# Patient Record
Sex: Male | Born: 1965 | Race: Black or African American | Hispanic: No | Smoking: Never smoker
Health system: Southern US, Community
[De-identification: ages and names within clinical notes are randomized; demographics above are authoritative.]

## PROBLEM LIST (undated history)

## (undated) DIAGNOSIS — Z91199 Patient's noncompliance with other medical treatment and regimen due to unspecified reason: Secondary | ICD-10-CM

## (undated) DIAGNOSIS — I5022 Chronic systolic (congestive) heart failure: Secondary | ICD-10-CM

## (undated) DIAGNOSIS — N179 Acute kidney failure, unspecified: Secondary | ICD-10-CM

## (undated) DIAGNOSIS — I10 Essential (primary) hypertension: Secondary | ICD-10-CM

## (undated) DIAGNOSIS — I428 Other cardiomyopathies: Secondary | ICD-10-CM

## (undated) DIAGNOSIS — M109 Gout, unspecified: Secondary | ICD-10-CM

## (undated) DIAGNOSIS — G4700 Insomnia, unspecified: Secondary | ICD-10-CM

## (undated) DIAGNOSIS — Z95810 Presence of automatic (implantable) cardiac defibrillator: Secondary | ICD-10-CM

## (undated) DIAGNOSIS — I2510 Atherosclerotic heart disease of native coronary artery without angina pectoris: Secondary | ICD-10-CM

## (undated) DIAGNOSIS — I509 Heart failure, unspecified: Secondary | ICD-10-CM

## (undated) DIAGNOSIS — F419 Anxiety disorder, unspecified: Secondary | ICD-10-CM

## (undated) DIAGNOSIS — F32A Depression, unspecified: Secondary | ICD-10-CM

## (undated) DIAGNOSIS — R519 Headache, unspecified: Secondary | ICD-10-CM

## (undated) DIAGNOSIS — Z9119 Patient's noncompliance with other medical treatment and regimen: Secondary | ICD-10-CM

## (undated) DIAGNOSIS — F1010 Alcohol abuse, uncomplicated: Secondary | ICD-10-CM

## (undated) DIAGNOSIS — Z598 Other problems related to housing and economic circumstances: Secondary | ICD-10-CM

## (undated) DIAGNOSIS — G4730 Sleep apnea, unspecified: Secondary | ICD-10-CM

## (undated) DIAGNOSIS — R0600 Dyspnea, unspecified: Secondary | ICD-10-CM

## (undated) DIAGNOSIS — I472 Ventricular tachycardia: Secondary | ICD-10-CM

## (undated) DIAGNOSIS — Z599 Problem related to housing and economic circumstances, unspecified: Secondary | ICD-10-CM

## (undated) DIAGNOSIS — F329 Major depressive disorder, single episode, unspecified: Secondary | ICD-10-CM

## (undated) DIAGNOSIS — G8929 Other chronic pain: Secondary | ICD-10-CM

## (undated) HISTORY — PX: BAROREFLEX SYSTEM INSERTION: EP1254

## (undated) HISTORY — DX: Gout, unspecified: M10.9

## (undated) HISTORY — DX: Atherosclerotic heart disease of native coronary artery without angina pectoris: I25.10

## (undated) HISTORY — DX: Ventricular tachycardia: I47.2

## (undated) HISTORY — DX: Other cardiomyopathies: I42.8

## (undated) HISTORY — DX: Acute kidney failure, unspecified: N17.9

## (undated) HISTORY — DX: Heart failure, unspecified: I50.9

## (undated) HISTORY — PX: HERNIA REPAIR: SHX51

---
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
## 2000-02-03 ENCOUNTER — Emergency Department (HOSPITAL_COMMUNITY): Admission: EM | Admit: 2000-02-03 | Discharge: 2000-02-03 | Payer: Self-pay | Admitting: Emergency Medicine

## 2000-02-04 ENCOUNTER — Emergency Department (HOSPITAL_COMMUNITY): Admission: EM | Admit: 2000-02-04 | Discharge: 2000-02-04 | Payer: Self-pay | Admitting: Emergency Medicine

## 2000-05-24 ENCOUNTER — Encounter: Payer: Self-pay | Admitting: Emergency Medicine

## 2000-05-24 ENCOUNTER — Emergency Department (HOSPITAL_COMMUNITY): Admission: EM | Admit: 2000-05-24 | Discharge: 2000-05-24 | Payer: Self-pay

## 2000-06-09 ENCOUNTER — Encounter: Payer: Self-pay | Admitting: Emergency Medicine

## 2000-06-09 ENCOUNTER — Emergency Department (HOSPITAL_COMMUNITY): Admission: EM | Admit: 2000-06-09 | Discharge: 2000-06-09 | Payer: Self-pay | Admitting: Emergency Medicine

## 2000-08-15 ENCOUNTER — Encounter: Payer: Self-pay | Admitting: Emergency Medicine

## 2000-08-15 ENCOUNTER — Emergency Department (HOSPITAL_COMMUNITY): Admission: EM | Admit: 2000-08-15 | Discharge: 2000-08-15 | Payer: Self-pay | Admitting: Emergency Medicine

## 2000-08-30 ENCOUNTER — Encounter: Payer: Self-pay | Admitting: Emergency Medicine

## 2000-08-30 ENCOUNTER — Emergency Department (HOSPITAL_COMMUNITY): Admission: EM | Admit: 2000-08-30 | Discharge: 2000-08-30 | Payer: Self-pay | Admitting: Emergency Medicine

## 2003-01-06 DIAGNOSIS — I219 Acute myocardial infarction, unspecified: Secondary | ICD-10-CM

## 2003-01-06 HISTORY — DX: Acute myocardial infarction, unspecified: I21.9

## 2009-01-05 HISTORY — PX: PERCUTANEOUS CORONARY STENT INTERVENTION (PCI-S): SHX6016

## 2013-06-05 HISTORY — PX: CARDIAC DEFIBRILLATOR PLACEMENT: SHX171

## 2014-01-10 DIAGNOSIS — M1A9XX0 Chronic gout, unspecified, without tophus (tophi): Secondary | ICD-10-CM | POA: Diagnosis not present

## 2014-01-10 DIAGNOSIS — I10 Essential (primary) hypertension: Secondary | ICD-10-CM | POA: Diagnosis not present

## 2014-01-10 DIAGNOSIS — E780 Pure hypercholesterolemia: Secondary | ICD-10-CM | POA: Diagnosis not present

## 2014-01-10 DIAGNOSIS — I429 Cardiomyopathy, unspecified: Secondary | ICD-10-CM | POA: Diagnosis not present

## 2014-01-10 DIAGNOSIS — F329 Major depressive disorder, single episode, unspecified: Secondary | ICD-10-CM | POA: Diagnosis not present

## 2014-01-10 DIAGNOSIS — Z658 Other specified problems related to psychosocial circumstances: Secondary | ICD-10-CM | POA: Diagnosis not present

## 2014-01-17 DIAGNOSIS — N189 Chronic kidney disease, unspecified: Secondary | ICD-10-CM | POA: Diagnosis not present

## 2014-01-17 DIAGNOSIS — E876 Hypokalemia: Secondary | ICD-10-CM | POA: Diagnosis not present

## 2014-01-17 DIAGNOSIS — I5020 Unspecified systolic (congestive) heart failure: Secondary | ICD-10-CM | POA: Diagnosis not present

## 2014-01-18 DIAGNOSIS — I429 Cardiomyopathy, unspecified: Secondary | ICD-10-CM | POA: Diagnosis not present

## 2014-01-18 DIAGNOSIS — M109 Gout, unspecified: Secondary | ICD-10-CM | POA: Diagnosis not present

## 2014-01-18 DIAGNOSIS — E780 Pure hypercholesterolemia: Secondary | ICD-10-CM | POA: Diagnosis not present

## 2014-01-18 DIAGNOSIS — R079 Chest pain, unspecified: Secondary | ICD-10-CM | POA: Diagnosis not present

## 2014-01-18 DIAGNOSIS — I10 Essential (primary) hypertension: Secondary | ICD-10-CM | POA: Diagnosis not present

## 2014-01-18 DIAGNOSIS — I509 Heart failure, unspecified: Secondary | ICD-10-CM | POA: Diagnosis not present

## 2014-01-18 DIAGNOSIS — Z79899 Other long term (current) drug therapy: Secondary | ICD-10-CM | POA: Diagnosis not present

## 2014-01-18 DIAGNOSIS — R0602 Shortness of breath: Secondary | ICD-10-CM | POA: Diagnosis not present

## 2014-01-18 DIAGNOSIS — F329 Major depressive disorder, single episode, unspecified: Secondary | ICD-10-CM | POA: Diagnosis not present

## 2014-01-18 DIAGNOSIS — I252 Old myocardial infarction: Secondary | ICD-10-CM | POA: Diagnosis not present

## 2014-01-18 DIAGNOSIS — Z888 Allergy status to other drugs, medicaments and biological substances status: Secondary | ICD-10-CM | POA: Diagnosis not present

## 2014-01-18 DIAGNOSIS — R600 Localized edema: Secondary | ICD-10-CM | POA: Diagnosis not present

## 2014-01-19 DIAGNOSIS — R079 Chest pain, unspecified: Secondary | ICD-10-CM | POA: Diagnosis not present

## 2014-01-19 DIAGNOSIS — R0602 Shortness of breath: Secondary | ICD-10-CM | POA: Diagnosis not present

## 2014-01-19 DIAGNOSIS — M109 Gout, unspecified: Secondary | ICD-10-CM | POA: Diagnosis not present

## 2014-01-19 DIAGNOSIS — I10 Essential (primary) hypertension: Secondary | ICD-10-CM | POA: Diagnosis not present

## 2014-01-19 DIAGNOSIS — I429 Cardiomyopathy, unspecified: Secondary | ICD-10-CM | POA: Diagnosis not present

## 2014-01-19 DIAGNOSIS — R600 Localized edema: Secondary | ICD-10-CM | POA: Diagnosis not present

## 2014-01-19 DIAGNOSIS — I509 Heart failure, unspecified: Secondary | ICD-10-CM | POA: Diagnosis not present

## 2014-01-19 DIAGNOSIS — E780 Pure hypercholesterolemia: Secondary | ICD-10-CM | POA: Diagnosis not present

## 2014-01-19 DIAGNOSIS — I252 Old myocardial infarction: Secondary | ICD-10-CM | POA: Diagnosis not present

## 2014-02-12 DIAGNOSIS — M5136 Other intervertebral disc degeneration, lumbar region: Secondary | ICD-10-CM | POA: Diagnosis not present

## 2014-02-15 DIAGNOSIS — E876 Hypokalemia: Secondary | ICD-10-CM | POA: Diagnosis not present

## 2014-02-19 DIAGNOSIS — E876 Hypokalemia: Secondary | ICD-10-CM | POA: Diagnosis not present

## 2014-02-19 DIAGNOSIS — I5020 Unspecified systolic (congestive) heart failure: Secondary | ICD-10-CM | POA: Diagnosis not present

## 2014-02-26 DIAGNOSIS — I429 Cardiomyopathy, unspecified: Secondary | ICD-10-CM | POA: Diagnosis not present

## 2014-03-22 DIAGNOSIS — I10 Essential (primary) hypertension: Secondary | ICD-10-CM | POA: Diagnosis not present

## 2014-03-26 DIAGNOSIS — Z45018 Encounter for adjustment and management of other part of cardiac pacemaker: Secondary | ICD-10-CM | POA: Diagnosis not present

## 2014-04-16 DIAGNOSIS — F321 Major depressive disorder, single episode, moderate: Secondary | ICD-10-CM | POA: Diagnosis not present

## 2014-04-23 DIAGNOSIS — M47816 Spondylosis without myelopathy or radiculopathy, lumbar region: Secondary | ICD-10-CM | POA: Diagnosis not present

## 2014-04-25 DIAGNOSIS — M5126 Other intervertebral disc displacement, lumbar region: Secondary | ICD-10-CM | POA: Diagnosis not present

## 2014-04-25 DIAGNOSIS — M5136 Other intervertebral disc degeneration, lumbar region: Secondary | ICD-10-CM | POA: Diagnosis not present

## 2014-04-25 DIAGNOSIS — M5137 Other intervertebral disc degeneration, lumbosacral region: Secondary | ICD-10-CM | POA: Diagnosis not present

## 2014-04-25 DIAGNOSIS — M549 Dorsalgia, unspecified: Secondary | ICD-10-CM | POA: Diagnosis not present

## 2014-04-25 DIAGNOSIS — M545 Low back pain: Secondary | ICD-10-CM | POA: Diagnosis not present

## 2014-04-27 DIAGNOSIS — M158 Other polyosteoarthritis: Secondary | ICD-10-CM | POA: Diagnosis not present

## 2014-04-27 DIAGNOSIS — M1A9XX0 Chronic gout, unspecified, without tophus (tophi): Secondary | ICD-10-CM | POA: Diagnosis not present

## 2014-05-22 DIAGNOSIS — M545 Low back pain: Secondary | ICD-10-CM | POA: Diagnosis not present

## 2014-05-28 DIAGNOSIS — K5730 Diverticulosis of large intestine without perforation or abscess without bleeding: Secondary | ICD-10-CM | POA: Diagnosis not present

## 2014-05-28 DIAGNOSIS — K648 Other hemorrhoids: Secondary | ICD-10-CM | POA: Diagnosis not present

## 2014-05-28 DIAGNOSIS — Z1211 Encounter for screening for malignant neoplasm of colon: Secondary | ICD-10-CM | POA: Diagnosis not present

## 2014-06-25 DIAGNOSIS — Z4502 Encounter for adjustment and management of automatic implantable cardiac defibrillator: Secondary | ICD-10-CM | POA: Diagnosis not present

## 2014-06-25 DIAGNOSIS — I429 Cardiomyopathy, unspecified: Secondary | ICD-10-CM | POA: Diagnosis not present

## 2014-08-29 DIAGNOSIS — M1A9XX0 Chronic gout, unspecified, without tophus (tophi): Secondary | ICD-10-CM | POA: Diagnosis not present

## 2014-08-29 DIAGNOSIS — M158 Other polyosteoarthritis: Secondary | ICD-10-CM | POA: Diagnosis not present

## 2014-09-17 DIAGNOSIS — Z4502 Encounter for adjustment and management of automatic implantable cardiac defibrillator: Secondary | ICD-10-CM | POA: Diagnosis not present

## 2014-09-17 DIAGNOSIS — I420 Dilated cardiomyopathy: Secondary | ICD-10-CM | POA: Diagnosis not present

## 2014-09-24 DIAGNOSIS — Z4502 Encounter for adjustment and management of automatic implantable cardiac defibrillator: Secondary | ICD-10-CM | POA: Diagnosis not present

## 2014-09-28 DIAGNOSIS — I420 Dilated cardiomyopathy: Secondary | ICD-10-CM | POA: Diagnosis not present

## 2014-09-28 DIAGNOSIS — Z95810 Presence of automatic (implantable) cardiac defibrillator: Secondary | ICD-10-CM | POA: Diagnosis not present

## 2014-12-06 DIAGNOSIS — M1A9XX0 Chronic gout, unspecified, without tophus (tophi): Secondary | ICD-10-CM | POA: Diagnosis not present

## 2015-01-06 DIAGNOSIS — I472 Ventricular tachycardia, unspecified: Secondary | ICD-10-CM

## 2015-01-06 HISTORY — DX: Ventricular tachycardia, unspecified: I47.20

## 2015-01-06 HISTORY — DX: Ventricular tachycardia: I47.2

## 2015-02-01 DIAGNOSIS — I499 Cardiac arrhythmia, unspecified: Secondary | ICD-10-CM | POA: Diagnosis not present

## 2015-02-01 DIAGNOSIS — I252 Old myocardial infarction: Secondary | ICD-10-CM | POA: Diagnosis not present

## 2015-02-01 DIAGNOSIS — T82198A Other mechanical complication of other cardiac electronic device, initial encounter: Secondary | ICD-10-CM | POA: Diagnosis not present

## 2015-02-01 DIAGNOSIS — I10 Essential (primary) hypertension: Secondary | ICD-10-CM | POA: Diagnosis not present

## 2015-02-01 DIAGNOSIS — R9431 Abnormal electrocardiogram [ECG] [EKG]: Secondary | ICD-10-CM | POA: Diagnosis not present

## 2015-02-01 DIAGNOSIS — M1990 Unspecified osteoarthritis, unspecified site: Secondary | ICD-10-CM | POA: Diagnosis not present

## 2015-02-01 DIAGNOSIS — R079 Chest pain, unspecified: Secondary | ICD-10-CM | POA: Diagnosis not present

## 2015-02-01 DIAGNOSIS — E785 Hyperlipidemia, unspecified: Secondary | ICD-10-CM | POA: Diagnosis not present

## 2015-02-01 DIAGNOSIS — I420 Dilated cardiomyopathy: Secondary | ICD-10-CM | POA: Diagnosis not present

## 2015-02-01 DIAGNOSIS — I472 Ventricular tachycardia: Secondary | ICD-10-CM | POA: Diagnosis not present

## 2015-02-01 DIAGNOSIS — I509 Heart failure, unspecified: Secondary | ICD-10-CM | POA: Diagnosis not present

## 2015-02-01 DIAGNOSIS — I2510 Atherosclerotic heart disease of native coronary artery without angina pectoris: Secondary | ICD-10-CM | POA: Diagnosis not present

## 2015-02-01 DIAGNOSIS — R0602 Shortness of breath: Secondary | ICD-10-CM | POA: Diagnosis not present

## 2015-02-02 DIAGNOSIS — I428 Other cardiomyopathies: Secondary | ICD-10-CM | POA: Diagnosis not present

## 2015-02-02 DIAGNOSIS — T82198A Other mechanical complication of other cardiac electronic device, initial encounter: Secondary | ICD-10-CM | POA: Diagnosis not present

## 2015-02-02 DIAGNOSIS — I5020 Unspecified systolic (congestive) heart failure: Secondary | ICD-10-CM | POA: Diagnosis not present

## 2015-02-02 DIAGNOSIS — D72829 Elevated white blood cell count, unspecified: Secondary | ICD-10-CM | POA: Diagnosis not present

## 2015-02-02 DIAGNOSIS — R740 Nonspecific elevation of levels of transaminase and lactic acid dehydrogenase [LDH]: Secondary | ICD-10-CM | POA: Diagnosis not present

## 2015-02-02 DIAGNOSIS — I10 Essential (primary) hypertension: Secondary | ICD-10-CM | POA: Diagnosis not present

## 2015-02-03 DIAGNOSIS — I10 Essential (primary) hypertension: Secondary | ICD-10-CM | POA: Diagnosis not present

## 2015-02-03 DIAGNOSIS — D72829 Elevated white blood cell count, unspecified: Secondary | ICD-10-CM | POA: Diagnosis not present

## 2015-02-03 DIAGNOSIS — I428 Other cardiomyopathies: Secondary | ICD-10-CM | POA: Diagnosis not present

## 2015-02-03 DIAGNOSIS — T82198A Other mechanical complication of other cardiac electronic device, initial encounter: Secondary | ICD-10-CM | POA: Diagnosis not present

## 2015-02-03 DIAGNOSIS — R740 Nonspecific elevation of levels of transaminase and lactic acid dehydrogenase [LDH]: Secondary | ICD-10-CM | POA: Diagnosis not present

## 2015-02-04 DIAGNOSIS — I428 Other cardiomyopathies: Secondary | ICD-10-CM | POA: Diagnosis not present

## 2015-02-04 DIAGNOSIS — I10 Essential (primary) hypertension: Secondary | ICD-10-CM | POA: Diagnosis not present

## 2015-02-04 DIAGNOSIS — D72829 Elevated white blood cell count, unspecified: Secondary | ICD-10-CM | POA: Diagnosis not present

## 2015-02-04 DIAGNOSIS — T82198A Other mechanical complication of other cardiac electronic device, initial encounter: Secondary | ICD-10-CM | POA: Diagnosis not present

## 2015-02-04 DIAGNOSIS — R740 Nonspecific elevation of levels of transaminase and lactic acid dehydrogenase [LDH]: Secondary | ICD-10-CM | POA: Diagnosis not present

## 2015-02-11 DIAGNOSIS — I5022 Chronic systolic (congestive) heart failure: Secondary | ICD-10-CM | POA: Diagnosis not present

## 2015-02-11 DIAGNOSIS — I420 Dilated cardiomyopathy: Secondary | ICD-10-CM | POA: Diagnosis not present

## 2015-02-11 DIAGNOSIS — I10 Essential (primary) hypertension: Secondary | ICD-10-CM | POA: Diagnosis not present

## 2015-02-11 DIAGNOSIS — I472 Ventricular tachycardia: Secondary | ICD-10-CM | POA: Diagnosis not present

## 2015-03-26 DIAGNOSIS — K219 Gastro-esophageal reflux disease without esophagitis: Secondary | ICD-10-CM | POA: Diagnosis not present

## 2015-03-26 DIAGNOSIS — I252 Old myocardial infarction: Secondary | ICD-10-CM | POA: Diagnosis not present

## 2015-03-26 DIAGNOSIS — I5022 Chronic systolic (congestive) heart failure: Secondary | ICD-10-CM | POA: Diagnosis not present

## 2015-03-26 DIAGNOSIS — M109 Gout, unspecified: Secondary | ICD-10-CM | POA: Diagnosis not present

## 2015-03-26 DIAGNOSIS — I110 Hypertensive heart disease with heart failure: Secondary | ICD-10-CM | POA: Diagnosis not present

## 2015-03-26 DIAGNOSIS — E7800 Pure hypercholesterolemia, unspecified: Secondary | ICD-10-CM | POA: Diagnosis not present

## 2015-03-26 DIAGNOSIS — R079 Chest pain, unspecified: Secondary | ICD-10-CM | POA: Diagnosis not present

## 2015-03-26 DIAGNOSIS — Z4502 Encounter for adjustment and management of automatic implantable cardiac defibrillator: Secondary | ICD-10-CM | POA: Diagnosis not present

## 2015-03-26 DIAGNOSIS — Z95810 Presence of automatic (implantable) cardiac defibrillator: Secondary | ICD-10-CM | POA: Diagnosis not present

## 2015-03-27 DIAGNOSIS — R079 Chest pain, unspecified: Secondary | ICD-10-CM | POA: Diagnosis not present

## 2015-03-27 DIAGNOSIS — I517 Cardiomegaly: Secondary | ICD-10-CM | POA: Diagnosis not present

## 2015-03-27 DIAGNOSIS — I509 Heart failure, unspecified: Secondary | ICD-10-CM | POA: Diagnosis not present

## 2015-03-27 DIAGNOSIS — I10 Essential (primary) hypertension: Secondary | ICD-10-CM | POA: Diagnosis not present

## 2015-03-27 DIAGNOSIS — I472 Ventricular tachycardia: Secondary | ICD-10-CM | POA: Diagnosis not present

## 2015-03-29 DIAGNOSIS — F329 Major depressive disorder, single episode, unspecified: Secondary | ICD-10-CM | POA: Diagnosis not present

## 2015-03-29 DIAGNOSIS — M1A9XX0 Chronic gout, unspecified, without tophus (tophi): Secondary | ICD-10-CM | POA: Diagnosis not present

## 2015-03-29 DIAGNOSIS — M158 Other polyosteoarthritis: Secondary | ICD-10-CM | POA: Diagnosis not present

## 2015-04-22 DIAGNOSIS — Z91010 Allergy to peanuts: Secondary | ICD-10-CM | POA: Diagnosis not present

## 2015-04-22 DIAGNOSIS — Z79899 Other long term (current) drug therapy: Secondary | ICD-10-CM | POA: Diagnosis not present

## 2015-04-22 DIAGNOSIS — M109 Gout, unspecified: Secondary | ICD-10-CM | POA: Diagnosis not present

## 2015-04-22 DIAGNOSIS — F329 Major depressive disorder, single episode, unspecified: Secondary | ICD-10-CM | POA: Diagnosis not present

## 2015-04-22 DIAGNOSIS — M1990 Unspecified osteoarthritis, unspecified site: Secondary | ICD-10-CM | POA: Diagnosis not present

## 2015-04-22 DIAGNOSIS — Z888 Allergy status to other drugs, medicaments and biological substances status: Secondary | ICD-10-CM | POA: Diagnosis not present

## 2015-04-22 DIAGNOSIS — I5022 Chronic systolic (congestive) heart failure: Secondary | ICD-10-CM | POA: Diagnosis not present

## 2015-04-22 DIAGNOSIS — Z95810 Presence of automatic (implantable) cardiac defibrillator: Secondary | ICD-10-CM | POA: Diagnosis not present

## 2015-04-22 DIAGNOSIS — E7800 Pure hypercholesterolemia, unspecified: Secondary | ICD-10-CM | POA: Diagnosis not present

## 2015-04-22 DIAGNOSIS — I471 Supraventricular tachycardia: Secondary | ICD-10-CM | POA: Diagnosis not present

## 2015-04-22 DIAGNOSIS — Z91013 Allergy to seafood: Secondary | ICD-10-CM | POA: Diagnosis not present

## 2015-04-22 DIAGNOSIS — I110 Hypertensive heart disease with heart failure: Secondary | ICD-10-CM | POA: Diagnosis not present

## 2015-04-22 DIAGNOSIS — R079 Chest pain, unspecified: Secondary | ICD-10-CM | POA: Diagnosis not present

## 2015-04-22 DIAGNOSIS — Z7982 Long term (current) use of aspirin: Secondary | ICD-10-CM | POA: Diagnosis not present

## 2015-04-22 DIAGNOSIS — I509 Heart failure, unspecified: Secondary | ICD-10-CM | POA: Diagnosis not present

## 2015-05-02 DIAGNOSIS — K8020 Calculus of gallbladder without cholecystitis without obstruction: Secondary | ICD-10-CM | POA: Diagnosis not present

## 2015-05-02 DIAGNOSIS — R932 Abnormal findings on diagnostic imaging of liver and biliary tract: Secondary | ICD-10-CM | POA: Diagnosis not present

## 2015-05-02 DIAGNOSIS — I509 Heart failure, unspecified: Secondary | ICD-10-CM | POA: Diagnosis not present

## 2015-05-13 DIAGNOSIS — Z91010 Allergy to peanuts: Secondary | ICD-10-CM | POA: Diagnosis not present

## 2015-05-13 DIAGNOSIS — Z7982 Long term (current) use of aspirin: Secondary | ICD-10-CM | POA: Diagnosis not present

## 2015-05-13 DIAGNOSIS — M1990 Unspecified osteoarthritis, unspecified site: Secondary | ICD-10-CM | POA: Diagnosis not present

## 2015-05-13 DIAGNOSIS — E7800 Pure hypercholesterolemia, unspecified: Secondary | ICD-10-CM | POA: Diagnosis not present

## 2015-05-13 DIAGNOSIS — I5022 Chronic systolic (congestive) heart failure: Secondary | ICD-10-CM | POA: Diagnosis not present

## 2015-05-13 DIAGNOSIS — M1000 Idiopathic gout, unspecified site: Secondary | ICD-10-CM | POA: Diagnosis not present

## 2015-05-13 DIAGNOSIS — I110 Hypertensive heart disease with heart failure: Secondary | ICD-10-CM | POA: Diagnosis not present

## 2015-05-13 DIAGNOSIS — Z91013 Allergy to seafood: Secondary | ICD-10-CM | POA: Diagnosis not present

## 2015-05-13 DIAGNOSIS — M2550 Pain in unspecified joint: Secondary | ICD-10-CM | POA: Diagnosis not present

## 2015-05-13 DIAGNOSIS — Z4502 Encounter for adjustment and management of automatic implantable cardiac defibrillator: Secondary | ICD-10-CM | POA: Diagnosis not present

## 2015-05-13 DIAGNOSIS — Z888 Allergy status to other drugs, medicaments and biological substances status: Secondary | ICD-10-CM | POA: Diagnosis not present

## 2015-05-13 DIAGNOSIS — I472 Ventricular tachycardia: Secondary | ICD-10-CM | POA: Diagnosis not present

## 2015-05-13 DIAGNOSIS — Z79899 Other long term (current) drug therapy: Secondary | ICD-10-CM | POA: Diagnosis not present

## 2015-06-20 ENCOUNTER — Emergency Department (HOSPITAL_COMMUNITY): Payer: Medicare Other

## 2015-06-20 ENCOUNTER — Emergency Department (HOSPITAL_COMMUNITY)
Admission: EM | Admit: 2015-06-20 | Discharge: 2015-06-20 | Disposition: A | Discharge status: Home / Self Care | Payer: Medicare Other | Attending: Dermatology | Admitting: Dermatology

## 2015-06-20 ENCOUNTER — Encounter (HOSPITAL_COMMUNITY): Payer: Self-pay | Admitting: Emergency Medicine

## 2015-06-20 DIAGNOSIS — R079 Chest pain, unspecified: Secondary | ICD-10-CM | POA: Diagnosis not present

## 2015-06-20 DIAGNOSIS — Z5321 Procedure and treatment not carried out due to patient leaving prior to being seen by health care provider: Secondary | ICD-10-CM | POA: Diagnosis not present

## 2015-06-20 DIAGNOSIS — I10 Essential (primary) hypertension: Secondary | ICD-10-CM | POA: Diagnosis not present

## 2015-06-20 HISTORY — DX: Gout, unspecified: M10.9

## 2015-06-20 HISTORY — DX: Insomnia, unspecified: G47.00

## 2015-06-20 HISTORY — DX: Essential (primary) hypertension: I10

## 2015-06-20 LAB — BASIC METABOLIC PANEL
Anion gap: 9 (ref 5–15)
BUN: 13 mg/dL (ref 6–20)
CALCIUM: 8.9 mg/dL (ref 8.9–10.3)
CHLORIDE: 108 mmol/L (ref 101–111)
CO2: 23 mmol/L (ref 22–32)
CREATININE: 0.96 mg/dL (ref 0.61–1.24)
Glucose, Bld: 101 mg/dL — ABNORMAL HIGH (ref 65–99)
Potassium: 3.2 mmol/L — ABNORMAL LOW (ref 3.5–5.1)
SODIUM: 140 mmol/L (ref 135–145)

## 2015-06-20 LAB — CBC
HCT: 39.3 % (ref 39.0–52.0)
Hemoglobin: 13 g/dL (ref 13.0–17.0)
MCH: 32.3 pg (ref 26.0–34.0)
MCHC: 33.1 g/dL (ref 30.0–36.0)
MCV: 97.5 fL (ref 78.0–100.0)
PLATELETS: 297 10*3/uL (ref 150–400)
RBC: 4.03 MIL/uL — AB (ref 4.22–5.81)
RDW: 13.8 % (ref 11.5–15.5)
WBC: 11.3 10*3/uL — AB (ref 4.0–10.5)

## 2015-06-20 LAB — I-STAT TROPONIN, ED: TROPONIN I, POC: 0.01 ng/mL (ref 0.00–0.08)

## 2015-06-20 NOTE — ED Notes (Addendum)
Called pt to go back to room to see provider X1 no answer.

## 2015-06-20 NOTE — ED Notes (Signed)
Called and no answer.

## 2015-06-20 NOTE — ED Notes (Signed)
Patient states started having chest pain this morning about 0400.   Patient states did have some shortness of breath with the central chest pain.   Patient states some radiation to L arm.   Denies dizziness, but states did have N/V with pain.  Denies diaphoresis.   Patient states chest pain has lessened since this morning.   Patient took some ibuprofen and it provided some relief.   Patient states he does have a defibrillator, and has had chest pains periodically since the defibrillator fired in Jan/Feb.

## 2015-07-03 ENCOUNTER — Ambulatory Visit (INDEPENDENT_AMBULATORY_CARE_PROVIDER_SITE_OTHER): Payer: Medicare Other | Admitting: Internal Medicine

## 2015-07-03 ENCOUNTER — Encounter: Payer: Self-pay | Admitting: Internal Medicine

## 2015-07-03 ENCOUNTER — Other Ambulatory Visit: Payer: Self-pay

## 2015-07-03 VITALS — BP 148/90 | HR 94 | Ht 65.0 in | Wt 152.2 lb

## 2015-07-03 DIAGNOSIS — I10 Essential (primary) hypertension: Secondary | ICD-10-CM | POA: Diagnosis not present

## 2015-07-03 DIAGNOSIS — I472 Ventricular tachycardia, unspecified: Secondary | ICD-10-CM | POA: Insufficient documentation

## 2015-07-03 DIAGNOSIS — I519 Heart disease, unspecified: Secondary | ICD-10-CM

## 2015-07-03 DIAGNOSIS — I428 Other cardiomyopathies: Secondary | ICD-10-CM | POA: Insufficient documentation

## 2015-07-03 DIAGNOSIS — I429 Cardiomyopathy, unspecified: Secondary | ICD-10-CM | POA: Diagnosis not present

## 2015-07-03 DIAGNOSIS — Z0000 Encounter for general adult medical examination without abnormal findings: Secondary | ICD-10-CM

## 2015-07-03 LAB — CUP PACEART INCLINIC DEVICE CHECK
Date Time Interrogation Session: 20170628040000
HighPow Impedance: 43 Ohm
HighPow Impedance: 50 Ohm
Implantable Lead Implant Date: 20150601
Implantable Lead Location: 753860
Implantable Lead Model: 295
Implantable Lead Serial Number: 134196
Lead Channel Pacing Threshold Amplitude: 1.8 V
Lead Channel Pacing Threshold Pulse Width: 0.4 ms
Lead Channel Setting Pacing Pulse Width: 0.4 ms
MDC IDC MSMT LEADCHNL RV IMPEDANCE VALUE: 436 Ohm
MDC IDC MSMT LEADCHNL RV SENSING INTR AMPL: 10.5 mV
MDC IDC PG SERIAL: 190689
MDC IDC SET LEADCHNL RV PACING AMPLITUDE: 2.5 V
MDC IDC SET LEADCHNL RV SENSING SENSITIVITY: 0.6 mV
MDC IDC STAT BRADY RV PERCENT PACED: 1 % — AB

## 2015-07-03 MED ORDER — DIGOXIN 125 MCG PO TABS: 0.1250 mg | ORAL_TABLET | Freq: Every day | ORAL | Status: DC

## 2015-07-03 MED ORDER — VALSARTAN 160 MG PO TABS: 160.0000 mg | ORAL_TABLET | Freq: Two times a day (BID) | ORAL | Status: DC

## 2015-07-03 NOTE — Patient Instructions (Addendum)
Medication Instructions:  Your physician recommends that you continue on your current medications as directed. Please refer to the Current Medication list given to you today.   Labwork: None ordered   Testing/Procedures: None ordered   Follow-Up:  You have been referred to Dr Eilleen Kempf for PCP needs   Your physician wants you to follow-up in:  6 months with Stanley [name], PAYou will receive a reminder letter in the mail two months in advance. If you don't receive a letter, please call our office to schedule the follow-up appointment.   Remote monitoring is used to monitor your  ICD from home. This monitoring reduces the number of office visits required to check your device to one time per year. It allows [place] to keep an eye on the functioning of your device to ensure it is working properly. You are scheduled for a device check from home on 10/02/15. You may send your transmission at any time that day. If you have a wireless device, the transmission will be sent automatically. After your physician reviews your transmission, you will receive a postcard with your next transmission date.    Any Other Special Instructions Will Be Listed Below (If Applicable).  Low-Sodium Eating Plan Sodium raises blood pressure and causes water to be held in the body. Getting less sodium from food will help lower your blood pressure, reduce any swelling, and protect your heart, liver, and kidneys. We get sodium by adding salt (sodium chloride) to food. Most of our sodium comes from canned, boxed, and frozen foods. Restaurant foods, fast foods, and pizza are also very high in sodium. Even if you take medicine to lower your blood pressure or to reduce fluid in your body, getting less sodium from your food is important. WHAT IS MY PLAN? Most people should limit their sodium intake to 2,300 mg a day. Your health care provider recommends that you limit your sodium intake to 2 grams a day.  WHAT DO I NEED TO KNOW ABOUT  THIS EATING PLAN? For the low-sodium eating plan, you will follow these general guidelines:  Choose foods with a % Daily Value for sodium of less than 5% (as listed on the food label).   Use salt-free seasonings or herbs instead of table salt or sea salt.   Check with your health care provider or pharmacist before using salt substitutes.   Eat fresh foods.  Eat more vegetables and fruits.  Limit canned vegetables. If you do use them, rinse them well to decrease the sodium.   Limit cheese to 1 oz (28 g) per day.   Eat lower-sodium products, often labeled as "lower sodium" or "no salt added."  Avoid foods that contain monosodium glutamate (MSG). MSG is sometimes added to [place] food and some canned foods.  Check food labels (Nutrition Facts labels) on foods to learn how much sodium is in one serving.  Eat more home-cooked food and less restaurant, buffet, and fast food.  When eating at a restaurant, ask that your food be prepared with less salt, or no salt if possible.  HOW DO I READ FOOD LABELS FOR SODIUM INFORMATION? The Nutrition Facts label lists the amount of sodium in one serving of the food. If you eat more than one serving, you must multiply the listed amount of sodium by the number of servings. Food labels may also identify foods as:  Sodium free--Less than 5 mg in a serving.  Very low sodium--35 mg or less in a serving.  Low sodium--140  mg or less in a serving.  Light in sodium--50% less sodium in a serving. For example, if a food that usually has 300 mg of sodium is changed to become light in sodium, it will have 150 mg of sodium.  Reduced sodium--25% less sodium in a serving. For example, if a food that usually has 400 mg of sodium is changed to reduced sodium, it will have 300 mg of sodium. WHAT FOODS CAN I EAT? Grains Low-sodium cereals, including oats, puffed wheat and rice, and shredded wheat cereals. Low-sodium crackers. Unsalted rice and pasta.  Lower-sodium bread.  Vegetables Frozen or fresh vegetables. Low-sodium or reduced-sodium canned vegetables. Low-sodium or reduced-sodium tomato sauce and paste. Low-sodium or reduced-sodium tomato and vegetable juices.  Fruits Fresh, frozen, and canned fruit. Fruit juice.  Meat and Other Protein Products Low-sodium canned tuna and salmon. Fresh or frozen meat, poultry, seafood, and fish. Lamb. Unsalted nuts. Dried beans, peas, and lentils without added salt. Unsalted canned beans. Homemade soups without salt. Eggs.  Dairy Milk. Soy milk. Ricotta cheese. Low-sodium or reduced-sodium cheeses. Yogurt.  Condiments Fresh and dried herbs and spices. Salt-free seasonings. Onion and garlic powders. Low-sodium varieties of mustard and ketchup. Fresh or refrigerated horseradish. Lemon juice.  Fats and Oils Reduced-sodium salad dressings. Unsalted butter.  Other Unsalted popcorn and pretzels.  The items listed above may not be a complete list of recommended foods or beverages. Contact your dietitian for more options. WHAT FOODS ARE NOT RECOMMENDED? Grains Instant hot cereals. Bread stuffing, pancake, and biscuit mixes. Croutons. Seasoned rice or pasta mixes. Noodle soup cups. Boxed or frozen macaroni and cheese. Self-rising flour. Regular salted crackers. Vegetables Regular canned vegetables. Regular canned tomato sauce and paste. Regular tomato and vegetable juices. Frozen vegetables in sauces. Salted [place] fries. Olives. Angie Fava. Relishes. Sauerkraut. Salsa. Meat and Other Protein Products Salted, canned, smoked, spiced, or pickled meats, seafood, or fish. Bacon, ham, sausage, hot dogs, corned beef, chipped beef, and packaged luncheon meats. Salt pork. Jerky. Pickled herring. Anchovies, regular canned tuna, and sardines. Salted nuts. Dairy Processed cheese and cheese spreads. Cheese curds. Blue cheese and cottage cheese. Buttermilk.  Condiments Onion and garlic salt, seasoned salt,  table salt, and sea salt. Canned and packaged gravies. Worcestershire sauce. Tartar sauce. Barbecue sauce. Teriyaki sauce. Soy sauce, including reduced sodium. Steak sauce. Fish sauce. Oyster sauce. Cocktail sauce. Horseradish that you find on the shelf. Regular ketchup and mustard. Meat flavorings and tenderizers. Bouillon cubes. Hot sauce. Tabasco sauce. Marinades. Taco seasonings. Relishes. Fats and Oils Regular salad dressings. Salted butter. Margarine. Ghee. Bacon fat.  Other Potato and tortilla chips. Corn chips and puffs. Salted popcorn and pretzels. Canned or dried soups. Pizza. Frozen entrees and pot pies.  The items listed above may not be a complete list of foods and beverages to avoid. Contact your dietitian for more information.   This information is not intended to replace advice given to you by your health care provider. Make sure you discuss any questions you have with your health care provider.   Document Released: 06/13/2001 Document Revised: 01/12/2014 Document Reviewed: 10/26/2012 Elsevier Interactive Patient Education [employer].      If you need a refill on your cardiac medications before your next appointment, please call your pharmacy.

## 2015-07-03 NOTE — Progress Notes (Signed)
Electrophysiology Office Note   Date:  07/03/2015   ID:  Stanley [name], DOB 12-Apr-1965, MRN [id number]  PCP:  None currently Cardiologist:  None currently Primary Electrophysiologist: Thompson Grayer, MD    CC: CHF   History of Present Illness: Stanley [name] is a 50 y.o. male who presents today for electrophysiology evaluation.   The patient reports initially being diagnosed with a nonischemic CM in 2006.  This was attributed to alcohol and blood pressure.  Denies FH of CM.  He was noncompliant with medical therapy.  He continued to have progressive symptoms.  He had PCI in 2011.  He then had ICD implanted in 2015 in [place].  He did well.  He developed progressive ETOH use and depression after his brother died.  He moved to [place] to live near his sister due to concerns for his health.  His care was at [facility] at that time.  He more recently has moved to [place] (2 weeks ago) to be near grandchildren.  He intends to live in [place] long term.  He reports noncompliance with medicines in 02/01/15.  He was working out at [employer] and had just gotten out of the sauna.  He developed sinus tachycardia at 170 bpm for which he  Received ATP therapy.  He appears to have degererated into VT and had several ICD shocks delivered.  He had some soreness early afterwards but feels that this is now improved.  He reports exertional chest pain over the past 6-7 months.  He has occasional SOB.  He has stopped working out since his ICD shock.  Today, he denies symptoms of palpitations,  orthopnea, PND, lower extremity edema, claudication, dizziness, presyncope, syncope, bleeding, or neurologic sequela. The patient is tolerating medications without difficulties and is otherwise without complaint today.    Past Medical History  Diagnosis Date  . Ventricular tachycardia ([place]) 01/2015    ATP delivered for sinus tach, degenerated into VT for which he received shock  . Hypertension   . Nonischemic cardiomyopathy ([place])    . Gout   . Insomnia   . Gouty arthritis   . CAD (coronary artery disease)     PCI in 2011  . CHF (congestive heart failure) ([place])   . Acute renal failure (ARF) [facility])    Past Surgical History  Procedure Laterality Date  . Cardiac defibrillator placement  06/05/13    [employer] Inogen ICD implanted at [facility] in [place] for primary prevention  . Hernia repair    . Percutaneous coronary stent intervention (pci-s)  2011     Current Outpatient Prescriptions  Medication Sig Dispense Refill  . buPROPion (WELLBUTRIN XL) 300 MG 24 hr tablet Take 300 mg by mouth daily.    . colchicine 0.6 MG tablet Take 0.6 mg by mouth daily.    . digoxin (LANOXIN) 0.25 MG tablet Take 0.25 mg by mouth daily.    . furosemide (LASIX) 40 MG tablet Take 1 tablet by mouth daily.  9  . nitroGLYCERIN (NITROSTAT) 0.4 MG SL tablet Place 0.4 mg under the tongue every 5 (five) minutes x 3 doses as needed for chest pain.    . potassium chloride SA (K-DUR,KLOR-CON) 20 MEQ tablet Take 1 tablet by mouth daily.  9  . traZODone (DESYREL) 100 MG tablet Take 1 tablet by mouth at bedtime.  2  . valsartan (DIOVAN) 160 MG tablet Take 1 tablet by mouth 2 (two) times daily.  0   No current facility-administered medications for  this visit.    Allergies:   Apple; Lisinopril; Other; and Shellfish allergy   Social History:  The patient  reports that he has never smoked. He does not have any smokeless tobacco history on file. He reports that he drinks alcohol. He reports that he does not use illicit drugs.   Family History:  The patient's family history includes Alzheimer's disease in his father; Cancer in his mother; Dementia in his father; Gout in his father; Hypertension in his father.    ROS:  Please see the history of present illness.   All other systems are reviewed and negative.    PHYSICAL EXAM: VS:  BP 148/90 mmHg  Pulse 94  Ht 5\' 5"  (1.651 m)  Wt 152 lb 3.2 oz (69.037 kg)  BMI 25.33 kg/m2 ,  BMI Body mass index is 25.33 kg/(m^2). GEN: Well nourished, well developed, in no acute distress HEENT: normal Neck: no JVD, carotid bruits, or masses Cardiac: RRR; 2/6 SEM at the apex Respiratory:  clear to auscultation bilaterally, normal work of breathing GI: soft, nontender, nondistended, + BS MS: no deformity or atrophy Skin: warm and dry, device pocket is well healed Neuro:  Strength and sensation are intact Psych: euthymic mood, full affect  EKG:  EKG is not ordered today. The ekg 06/23/15 reveals sinus rhythm 75 bpm, QRS 106 msec, LVH with early repolarization abnormality unchanged from 05/22/13  Device interrogation is reviewed today in detail.  See PaceArt for details.   Recent Labs: 06/20/2015: BUN 13; Creatinine, Ser 0.96; Hemoglobin 13.0; Platelets 297; Potassium 3.2*; Sodium 140    Lipid Panel  No results found for: CHOL, TRIG, HDL, CHOLHDL, VLDL, LDLCALC, LDLDIRECT   Wt Readings from Last 3 Encounters:  07/03/15 152 lb 3.2 oz (69.037 kg)  06/20/15 50 lb (22.68 kg)      Other studies Reviewed: Additional studies/ records that were reviewed today include: records from [place] (30 pages)  Review of the above records today demonstrates: echo 05/08/13- EF 27%,  Moderate LA enlargement, severe MR,    ASSESSMENT AND PLAN:  1.  Nonischemic CM/ NYHA Class II CHF The patient has chronic systolic dysfunction, worsened by ongiong ETOH and medical noncompliance.   He reports compliance with medicines only for the past 2 months.  The importance of compliance is discussed at length today.  Continue current medicines.  Try to obtain records from [place].  Consider switching to entresto upon return. Normal ICD function See Claudia Desanctis Art report I have adjusted device zones (turned VT1 to a monitor zone) due to recent inapproriate shock for sinus tach.  No other changes today.  2. ETOH  Cessation advised  3. Severe MR with LA enlargement Obtain records from [facility] if possible.  Would  consider repeating echo and possible TEE to further evaluate MR upon return depending on records from [place].  4. CAD Recent episodes of chest pain are noted.  He reports having myoview earlier this year for these symptoms Will need to obtain records He is not interested in further CV testing at this time  5. HTN Elevated today Reports better control at home 2 gram sodium restriction advised Consider entresto upon return (as above)  Follow-up with EP PA-C in 3 months [place] Return to see me in 1 year  Current medicines are reviewed at length with the patient today.   The patient does not have concerns regarding his medicines.  The following changes were made today:  none   Signed, Thompson Grayer, MD  07/03/2015  2:47 PM     [facility] [address] [place] [address] [phone] (office) [phone] (fax)

## 2015-07-26 ENCOUNTER — Other Ambulatory Visit: Payer: Self-pay | Admitting: *Deleted

## 2015-07-26 MED ORDER — FUROSEMIDE 40 MG PO TABS: 40.0000 mg | ORAL_TABLET | Freq: Every day | ORAL | Status: DC

## 2015-07-26 MED ORDER — CARVEDILOL 25 MG PO TABS: 25.0000 mg | ORAL_TABLET | Freq: Two times a day (BID) | ORAL | Status: DC

## 2015-09-25 ENCOUNTER — Other Ambulatory Visit (INDEPENDENT_AMBULATORY_CARE_PROVIDER_SITE_OTHER): Payer: Medicare Other

## 2015-09-25 ENCOUNTER — Ambulatory Visit (INDEPENDENT_AMBULATORY_CARE_PROVIDER_SITE_OTHER): Payer: Medicare Other | Admitting: Family

## 2015-09-25 ENCOUNTER — Encounter: Payer: Self-pay | Admitting: Family

## 2015-09-25 VITALS — BP 120/82 | HR 79 | Temp 97.9°F | Resp 16 | Ht 65.0 in | Wt 158.0 lb

## 2015-09-25 DIAGNOSIS — M109 Gout, unspecified: Secondary | ICD-10-CM | POA: Insufficient documentation

## 2015-09-25 DIAGNOSIS — M1009 Idiopathic gout, multiple sites: Secondary | ICD-10-CM | POA: Diagnosis not present

## 2015-09-25 DIAGNOSIS — I428 Other cardiomyopathies: Secondary | ICD-10-CM

## 2015-09-25 DIAGNOSIS — I429 Cardiomyopathy, unspecified: Secondary | ICD-10-CM

## 2015-09-25 DIAGNOSIS — M6249 Contracture of muscle, multiple sites: Secondary | ICD-10-CM

## 2015-09-25 DIAGNOSIS — M62838 Other muscle spasm: Secondary | ICD-10-CM

## 2015-09-25 LAB — COMPREHENSIVE METABOLIC PANEL
ALT: 22 U/L (ref 0–53)
AST: 25 U/L (ref 0–37)
Albumin: 3.9 g/dL (ref 3.5–5.2)
Alkaline Phosphatase: 52 U/L (ref 39–117)
BILIRUBIN TOTAL: 0.7 mg/dL (ref 0.2–1.2)
BUN: 28 mg/dL — AB (ref 6–23)
CHLORIDE: 105 meq/L (ref 96–112)
CO2: 28 mEq/L (ref 19–32)
Calcium: 9.1 mg/dL (ref 8.4–10.5)
Creatinine, Ser: 1.35 mg/dL (ref 0.40–1.50)
GFR: 71.32 mL/min (ref >60.00)
GLUCOSE: 85 mg/dL (ref 70–99)
POTASSIUM: 4 meq/L (ref 3.5–5.1)
SODIUM: 141 meq/L (ref 135–145)
Total Protein: 7.8 g/dL (ref 6.0–8.3)

## 2015-09-25 LAB — URIC ACID: URIC ACID, SERUM: 9.9 mg/dL — AB (ref 4.0–7.8)

## 2015-09-25 NOTE — Assessment & Plan Note (Signed)
Symptoms and exam consistent with cervical paraspinal muscle spasm with no traumatic Association. Treat conservatively with ice/moist heat and home exercise therapy. Continue Tylenol as needed for discomfort. Follow-up if symptoms worsen or do not improve.

## 2015-09-25 NOTE — Assessment & Plan Note (Signed)
Symptoms and exam consistent with resolving gout flare. Obtain uric acid levels. No further treatment necessary at this time. Continue to monitor and follow-up pending uric acid levels.

## 2015-09-25 NOTE — Patient Instructions (Addendum)
Thank you for choosing [facility].  SUMMARY AND INSTRUCTIONS:  For your neck:  Ice/moist heat 20 minutes every 2 hours as needed and following activity.  Tylenol as needed for discomfort.  Exercises 1-2 times daily.  Continue taking medications as prescribed.  Medication:  Your prescription(s) have been submitted to your pharmacy or been printed and provided for you. Please take as directed and contact our office if you believe you are having problem(s) with the medication(s) or have any questions.  Labs:  Please stop by the lab on the lower level of the building for your blood work. Your results will be released to [place] (or called to you) after review, usually within 72 hours after test completion. If any changes need to be made, you will be notified at that same time.  1.) The lab is open from 7:30am to 5:30 pm Monday-Friday 2.) No appointment is necessary 3.) Fasting (if needed) is 6-8 hours after food and drink; black coffee and water are okay   Follow up:  If your symptoms worsen or fail to improve, please contact our office for further instruction, or in case of emergency go directly to the emergency room at the closest medical facility.    Cervical Strain and Sprain With Rehab Cervical strain and sprain are injuries that commonly occur with "whiplash" injuries. Whiplash occurs when the neck is forcefully whipped backward or forward, such as during a motor vehicle accident or during contact sports. The muscles, ligaments, tendons, discs, and nerves of the neck are susceptible to injury when this occurs. RISK FACTORS Risk of having a whiplash injury increases if:  Osteoarthritis of the spine.  Situations that make head or neck accidents or trauma more likely.  High-risk sports (football, rugby, wrestling, hockey, auto racing, gymnastics, diving, contact karate, or boxing).  Poor strength and flexibility of the neck.  Previous neck injury.  Poor tackling  technique.  Improperly fitted or padded equipment. SYMPTOMS   Pain or stiffness in the front or back of neck or both.  Symptoms may present immediately or up to 24 hours after injury.  Dizziness, headache, nausea, and vomiting.  Muscle spasm with soreness and stiffness in the neck.  Tenderness and swelling at the injury site. PREVENTION  Learn and use proper technique (avoid tackling with the head, spearing, and head-butting; use proper falling techniques to avoid landing on the head).  Warm up and stretch properly before activity.  Maintain physical fitness:  Strength, flexibility, and endurance.  Cardiovascular fitness.  Wear properly fitted and padded protective equipment, such as padded soft collars, for participation in contact sports. PROGNOSIS  Recovery from cervical strain and sprain injuries is dependent on the extent of the injury. These injuries are usually curable in 1 week to 3 months with appropriate treatment.  RELATED COMPLICATIONS   Temporary numbness and weakness may occur if the nerve roots are damaged, and this may persist until the nerve has completely healed.  Chronic pain due to frequent recurrence of symptoms.  Prolonged healing, especially if activity is resumed too soon (before complete recovery). TREATMENT  Treatment initially involves the use of ice and medication to help reduce pain and inflammation. It is also important to perform strengthening and stretching exercises and modify activities that worsen symptoms so the injury does not get worse. These exercises may be performed at home or with a therapist. For patients who experience severe symptoms, a soft, padded collar may be recommended to be worn around the neck.  Improving your  posture may help reduce symptoms. Posture improvement includes pulling your chin and abdomen in while sitting or standing. If you are sitting, sit in a firm chair with your buttocks against the back of the chair. While  sleeping, try replacing your pillow with a small towel rolled to 2 inches in diameter, or use a cervical pillow or soft cervical collar. Poor sleeping positions delay healing.  For patients with nerve root damage, which causes numbness or weakness, the use of a cervical traction apparatus may be recommended. Surgery is rarely necessary for these injuries. However, cervical strain and sprains that are present at birth (congenital) may require surgery. MEDICATION   If pain medication is necessary, nonsteroidal anti-inflammatory medications, such as aspirin and ibuprofen, or other minor pain relievers, such as acetaminophen, are often recommended.  Do not take pain medication for 7 days before surgery.  Prescription pain relievers may be given if deemed necessary by your caregiver. Use only as directed and only as much as you need. HEAT AND COLD:   Cold treatment (icing) relieves pain and reduces inflammation. Cold treatment should be applied for 10 to 15 minutes every 2 to 3 hours for inflammation and pain and immediately after any activity that aggravates your symptoms. Use ice packs or an ice massage.  Heat treatment may be used prior to performing the stretching and strengthening activities prescribed by your caregiver, physical therapist, or athletic trainer. Use a heat pack or a warm soak. SEEK MEDICAL CARE IF:   Symptoms get worse or do not improve in 2 weeks despite treatment.  New, unexplained symptoms develop (drugs used in treatment may produce side effects). EXERCISES RANGE OF MOTION (ROM) AND STRETCHING EXERCISES - Cervical Strain and Sprain These exercises may help you when beginning to rehabilitate your injury. In order to successfully resolve your symptoms, you must improve your posture. These exercises are designed to help reduce the forward-head and rounded-shoulder posture which contributes to this condition. Your symptoms may resolve with or without further involvement from your  physician, physical therapist or athletic trainer. While completing these exercises, remember:   Restoring tissue flexibility helps normal motion to return to the joints. This allows healthier, less painful movement and activity.  An effective stretch should be held for at least 20 seconds, although you may need to begin with shorter hold times for comfort.  A stretch should never be painful. You should only feel a gentle lengthening or release in the stretched tissue. STRETCH- Axial Extensors  Lie on your back on the floor. You may bend your knees for comfort. Place a rolled-up hand towel or dish towel, about 2 inches in diameter, under the part of your head that makes contact with the floor.  Gently tuck your chin, as if trying to make a "double chin," until you feel a gentle stretch at the base of your head.  Hold __________ seconds. Repeat __________ times. Complete this exercise __________ times per day.  STRETCH - Axial Extension   Stand or sit on a firm surface. Assume a good posture: chest up, shoulders drawn back, abdominal muscles slightly tense, knees unlocked (if standing) and feet hip width apart.  Slowly retract your chin so your head slides back and your chin slightly lowers. Continue to look straight ahead.  You should feel a gentle stretch in the back of your head. Be certain not to feel an aggressive stretch since this can cause headaches later.  Hold for __________ seconds. Repeat __________ times. Complete this exercise __________  times per day. STRETCH - Cervical Side Bend   Stand or sit on a firm surface. Assume a good posture: chest up, shoulders drawn back, abdominal muscles slightly tense, knees unlocked (if standing) and feet hip width apart.  Without letting your nose or shoulders move, slowly tip your right / left ear to your shoulder until your feel a gentle stretch in the muscles on the opposite side of your neck.  Hold __________ seconds. Repeat  __________ times. Complete this exercise __________ times per day. STRETCH - Cervical Rotators   Stand or sit on a firm surface. Assume a good posture: chest up, shoulders drawn back, abdominal muscles slightly tense, knees unlocked (if standing) and feet hip width apart.  Keeping your eyes level with the ground, slowly turn your head until you feel a gentle stretch along the back and opposite side of your neck.  Hold __________ seconds. Repeat __________ times. Complete this exercise __________ times per day. RANGE OF MOTION - Neck Circles   Stand or sit on a firm surface. Assume a good posture: chest up, shoulders drawn back, abdominal muscles slightly tense, knees unlocked (if standing) and feet hip width apart.  Gently roll your head down and around from the back of one shoulder to the back of the other. The motion should never be forced or painful.  Repeat the motion 10-20 times, or until you feel the neck muscles relax and loosen. Repeat __________ times. Complete the exercise __________ times per day. STRENGTHENING EXERCISES - Cervical Strain and Sprain These exercises may help you when beginning to rehabilitate your injury. They may resolve your symptoms with or without further involvement from your physician, physical therapist, or athletic trainer. While completing these exercises, remember:   Muscles can gain both the endurance and the strength needed for everyday activities through controlled exercises.  Complete these exercises as instructed by your physician, physical therapist, or athletic trainer. Progress the resistance and repetitions only as guided.  You may experience muscle soreness or fatigue, but the pain or discomfort you are trying to eliminate should never worsen during these exercises. If this pain does worsen, stop and make certain you are following the directions exactly. If the pain is still present after adjustments, discontinue the exercise until you can  discuss the trouble with your clinician. STRENGTH - Cervical Flexors, Isometric  Face a wall, standing about 6 inches away. Place a small pillow, a ball about 6-8 inches in diameter, or a folded towel between your forehead and the wall.  Slightly tuck your chin and gently push your forehead into the soft object. Push only with mild to moderate intensity, building up tension gradually. Keep your jaw and forehead relaxed.  Hold 10 to 20 seconds. Keep your breathing relaxed.  Release the tension slowly. Relax your neck muscles completely before you start the next repetition. Repeat __________ times. Complete this exercise __________ times per day. STRENGTH- Cervical Lateral Flexors, Isometric   Stand about 6 inches away from a wall. Place a small pillow, a ball about 6-8 inches in diameter, or a folded towel between the side of your head and the wall.  Slightly tuck your chin and gently tilt your head into the soft object. Push only with mild to moderate intensity, building up tension gradually. Keep your jaw and forehead relaxed.  Hold 10 to 20 seconds. Keep your breathing relaxed.  Release the tension slowly. Relax your neck muscles completely before you start the next repetition. Repeat __________ times. Complete this exercise  __________ times per day. STRENGTH - Cervical Extensors, Isometric   Stand about 6 inches away from a wall. Place a small pillow, a ball about 6-8 inches in diameter, or a folded towel between the back of your head and the wall.  Slightly tuck your chin and gently tilt your head back into the soft object. Push only with mild to moderate intensity, building up tension gradually. Keep your jaw and forehead relaxed.  Hold 10 to 20 seconds. Keep your breathing relaxed.  Release the tension slowly. Relax your neck muscles completely before you start the next repetition. Repeat __________ times. Complete this exercise __________ times per day. POSTURE AND BODY  MECHANICS CONSIDERATIONS - Cervical Strain and Sprain Keeping correct posture when sitting, standing or completing your activities will reduce the stress put on different body tissues, allowing injured tissues a chance to heal and limiting painful experiences. The following are general guidelines for improved posture. Your physician or physical therapist will provide you with any instructions specific to your needs. While reading these guidelines, remember:  The exercises prescribed by your provider will help you have the flexibility and strength to maintain correct postures.  The correct posture provides the optimal environment for your joints to work. All of your joints have less wear and tear when properly supported by a spine with good posture. This means you will experience a healthier, less painful body.  Correct posture must be practiced with all of your activities, especially prolonged sitting and standing. Correct posture is as important when doing repetitive low-stress activities (typing) as it is when doing a single heavy-load activity (lifting). PROLONGED STANDING WHILE SLIGHTLY LEANING FORWARD When completing a task that requires you to lean forward while standing in one place for a long time, place either foot up on a stationary 2- to 4-inch high object to help maintain the best posture. When both feet are on the ground, the low back tends to lose its slight inward curve. If this curve flattens (or becomes too large), then the back and your other joints will experience too much stress, fatigue more quickly, and can cause pain.  RESTING POSITIONS Consider which positions are most painful for you when choosing a resting position. If you have pain with flexion-based activities (sitting, bending, stooping, squatting), choose a position that allows you to rest in a less flexed posture. You would want to avoid curling into a fetal position on your side. If your pain worsens with extension-based  activities (prolonged standing, working overhead), avoid resting in an extended position such as sleeping on your stomach. Most people will find more comfort when they rest with their spine in a more neutral position, neither too rounded nor too arched. Lying on a non-sagging bed on your side with a pillow between your knees, or on your back with a pillow under your knees will often provide some relief. Keep in mind, being in any one position for a prolonged period of time, no matter how correct your posture, can still lead to stiffness. WALKING Walk with an upright posture. Your ears, shoulders, and hips should all line up. OFFICE WORK When working at a desk, create an environment that supports good, upright posture. Without extra support, muscles fatigue and lead to excessive strain on joints and other tissues. CHAIR:  A chair should be able to slide under your desk when your back makes contact with the back of the chair. This allows you to work closely.  The chair's height should allow your eyes  to be level with the upper part of your monitor and your hands to be slightly lower than your elbows.  Body position:  Your feet should make contact with the floor. If this is not possible, use a foot rest.  Keep your ears over your shoulders. This will reduce stress on your neck and low back.   This information is not intended to replace advice given to you by your health care provider. Make sure you discuss any questions you have with your health care provider.   Document Released: 12/22/2004 Document Revised: 01/12/2014 Document Reviewed: 04/05/2008 Elsevier Interactive Patient Education [employer].

## 2015-09-25 NOTE — Progress Notes (Signed)
Subjective:    Patient ID: Stanley [name], male    DOB: 1965-04-29, 50 y.o.   MRN: [id number]  Chief Complaint  Patient presents with  . Establish Care    gout in left foot 3 weeks ago, pain is still there, right side of neck and down the right arm pain    HPI:  Stanley [name] is a 50 y.o. male who  has a past medical history of Acute renal failure (ARF) ([place]); CAD (coronary artery disease); CHF (congestive heart failure) ([place]); Gout; Gouty arthritis; Hypertension; Insomnia; Nonischemic cardiomyopathy ([place]); and Ventricular tachycardia ([place]) (01/2015). and presents today for an office visit to establish care.  1.) Gout - Previously diagnosed with gout and experienced a flair of gout located in his left ankle that has been going on for about 2-3 weeks. Generally has gout flares in multiple joints include his hands, ankle, and right knee. Modifying factors include colchicine as needed which does help with his symptoms. Continues to experience some mild left ankle pain that is improved since the initial onset.  2.) Neck pain - Associated symptom of pain located in the right side of neck that radiates down to his elbow has been going on for about 1 week. Denies any specific trauma or injury. Believes it may be related to sleeping wrong. Pain is described as feeling heavy at times and is sharp and waxes and wanes. Modifying factors include ibuprofen which did help a little.     Allergies  Allergen Reactions  . Apple     unknown  . Lisinopril     UNKNOWN  . Other     Nuts - unknown reaction  . Shellfish Allergy     UNKNOWN      Outpatient Medications Prior to Visit  Medication Sig Dispense Refill  . buPROPion (WELLBUTRIN XL) 300 MG 24 hr tablet Take 300 mg by mouth daily.    . carvedilol (COREG) 25 MG tablet Take 1 tablet (25 mg total) by mouth 2 (two) times daily with a meal. 180 tablet 3  . colchicine 0.6 MG tablet Take 0.6 mg by mouth daily.    . digoxin (LANOXIN) 0.125 MG  tablet Take 1 tablet (0.125 mg total) by mouth daily. 90 tablet 3  . furosemide (LASIX) 40 MG tablet Take 1 tablet (40 mg total) by mouth daily. 90 tablet 3  . nitroGLYCERIN (NITROSTAT) 0.4 MG SL tablet Place 0.4 mg under the tongue every 5 (five) minutes x 3 doses as needed for chest pain.    . potassium chloride SA (K-DUR,KLOR-CON) 20 MEQ tablet Take 1 tablet by mouth daily.  9  . traZODone (DESYREL) 100 MG tablet Take 1 tablet by mouth at bedtime.  2  . valsartan (DIOVAN) 160 MG tablet Take 1 tablet (160 mg total) by mouth 2 (two) times daily. 180 tablet 3   No facility-administered medications prior to visit.      Past Medical History:  Diagnosis Date  . Acute renal failure (ARF) ([place])   . CAD (coronary artery disease)    PCI in 2011  . CHF (congestive heart failure) ([place])   . Gout   . Gouty arthritis   . Hypertension   . Insomnia   . Nonischemic cardiomyopathy ([place])   . Ventricular tachycardia ([place]) 01/2015   ATP delivered for sinus tach, degenerated into VT for which he received shock      Past Surgical History:  Procedure Laterality Date  . CARDIAC DEFIBRILLATOR PLACEMENT  06/05/13  [employer] ICD implanted at [facility] in [place] for primary prevention  . HERNIA REPAIR    . PERCUTANEOUS CORONARY STENT INTERVENTION (PCI-S)  2011      Family History  Problem Relation Age of Onset  . Cancer Mother     Pancreatic  . Hypertension Father   . Alzheimer's disease Father   . Gout Father   . Dementia Father   . Heart disease Maternal Grandmother   . Gout Maternal Grandfather   . Gout Paternal Grandfather       Social History   Social History  . Marital status: Divorced    Spouse name: N/A  . Number of children: 1  . Years of education: 26   Occupational History  . Disability    Social History Main Topics  . Smoking status: Never Smoker  . Smokeless tobacco: Never Used  . Alcohol use No  . Drug use: No  . Sexual activity: Not  on file   Other Topics Concern  . Not on file   Social History Narrative   Lives in [place] alone.  Disabled.  Previously worked as a Careers adviser.   Fun: Rest, Read and watch movies.       Review of Systems  Constitutional: Negative for chills and fever.  Eyes:       Negative for changes in vision  Respiratory: Negative for cough, chest tightness and wheezing.   Cardiovascular: Negative for chest pain, palpitations and leg swelling.  Musculoskeletal: Positive for neck pain and neck stiffness.  Neurological: Negative for dizziness, weakness and light-headedness.       Objective:    BP 120/82 (BP Location: Left Arm, Patient Position: Sitting, Cuff Size: Normal)   Pulse 79   Temp 97.9 F (36.6 C) (Oral)   Resp 16   Ht 5\' 5"  (1.651 m)   Wt 158 lb (71.7 kg)   SpO2 96%   BMI 26.29 kg/m  Nursing note and vital signs reviewed.  Physical Exam  Constitutional: He is oriented to person, place, and time. He appears well-developed and well-nourished. No distress.  Neck:  No obvious deformity, discoloration, or edema. Mild tenderness elicited over right paraspinal musculature with mild spasm noted. No deformities or crepitus present. Range of motion limited in lateral bending greater on the right than on the left. Distal pulses and sensation are intact and appropriate. Negative cervical compression  Cardiovascular: Normal rate, regular rhythm, normal heart sounds and intact distal pulses.   Pulmonary/Chest: Effort normal and breath sounds normal.  Musculoskeletal:  Left ankle - no obvious deformity, discoloration, or edema. No palpable tenderness able to be elicited. Range of motion within normal limits. Strength is normal. Distal pulses and sensation are intact and appropriate. Ligamentous testing is negative.  Neurological: He is alert and oriented to person, place, and time.  Skin: Skin is warm and dry.  Psychiatric: He has a normal mood and affect. His behavior is  normal. Judgment and thought content normal.        Assessment & Plan:   Problem List Items Addressed This Visit      Cardiovascular and Mediastinum   Nonischemic cardiomyopathy ([place])    Heart failure appears stable and euvolemic with no chest pain or shortness of breath. Continue current dosage of valsartan, furosemide, digoxin, and carvedilol. Continue to monitor weight at home. Continue follow-up and management per cardiology.        Other   Gout - Primary    Symptoms and  exam consistent with resolving gout flare. Obtain uric acid levels. No further treatment necessary at this time. Continue to monitor and follow-up pending uric acid levels.      Cervical paraspinal muscle spasm    Symptoms and exam consistent with cervical paraspinal muscle spasm with no traumatic Association. Treat conservatively with ice/moist heat and home exercise therapy. Continue Tylenol as needed for discomfort. Follow-up if symptoms worsen or do not improve.       Other Visit Diagnoses   None.      I am having Mr. Melka maintain his potassium chloride SA, traZODone, colchicine, buPROPion, nitroGLYCERIN, digoxin, valsartan, furosemide, and carvedilol.   Follow-up: Return if symptoms worsen or fail to improve.  Mauricio Po, FNP

## 2015-09-25 NOTE — Assessment & Plan Note (Addendum)
Heart failure appears stable and euvolemic with no chest pain or shortness of breath. Continue current dosage of valsartan, furosemide, digoxin, and carvedilol. Continue to monitor weight at home. Continue follow-up and management per cardiology.

## 2015-09-26 ENCOUNTER — Other Ambulatory Visit: Payer: Self-pay | Admitting: Family

## 2015-09-26 MED ORDER — ALLOPURINOL 100 MG PO TABS: 100.0000 mg | ORAL_TABLET | Freq: Every day | ORAL | 1 refills | Status: DC

## 2015-10-02 ENCOUNTER — Telehealth: Payer: Self-pay | Admitting: Cardiology

## 2015-10-02 ENCOUNTER — Encounter: Payer: Medicare Other | Admitting: *Deleted

## 2015-10-02 NOTE — Telephone Encounter (Signed)
Spoke with pt and reminded pt of remote transmission that is due today. Pt verbalized understanding.   

## 2015-10-25 ENCOUNTER — Ambulatory Visit (INDEPENDENT_AMBULATORY_CARE_PROVIDER_SITE_OTHER): Payer: Medicare Other | Admitting: *Deleted

## 2015-10-25 DIAGNOSIS — I428 Other cardiomyopathies: Secondary | ICD-10-CM

## 2015-10-29 ENCOUNTER — Encounter: Payer: Self-pay | Admitting: Cardiology

## 2015-10-29 NOTE — Progress Notes (Signed)
Remote ICD transmission.   

## 2015-10-30 ENCOUNTER — Encounter: Payer: Self-pay | Admitting: Physician Assistant

## 2015-11-01 LAB — CUP PACEART REMOTE DEVICE CHECK
Brady Statistic RV Percent Paced: 0 %
Date Time Interrogation Session: 20171020222800
HIGH POWER IMPEDANCE MEASURED VALUE: 54 Ohm
Implantable Lead Implant Date: 20150601
Lead Channel Impedance Value: 456 Ohm
Lead Channel Pacing Threshold Amplitude: 1.8 V
Lead Channel Pacing Threshold Pulse Width: 0.4 ms
Lead Channel Setting Pacing Pulse Width: 0.4 ms
MDC IDC LEAD LOCATION: 753860
MDC IDC LEAD MODEL: 295
MDC IDC LEAD SERIAL: 134196
MDC IDC MSMT BATTERY REMAINING LONGEVITY: 144 mo
MDC IDC MSMT BATTERY REMAINING PERCENTAGE: 100 %
MDC IDC SET LEADCHNL RV PACING AMPLITUDE: 2.5 V
MDC IDC SET LEADCHNL RV SENSING SENSITIVITY: 0.6 mV
Pulse Gen Serial Number: 190689

## 2015-11-29 ENCOUNTER — Other Ambulatory Visit: Payer: Self-pay | Admitting: Family

## 2015-12-26 ENCOUNTER — Encounter: Payer: Self-pay | Admitting: Physician Assistant

## 2016-01-01 ENCOUNTER — Other Ambulatory Visit: Payer: Self-pay | Admitting: Family

## 2016-01-03 ENCOUNTER — Telehealth: Payer: Self-pay | Admitting: Internal Medicine

## 2016-01-03 NOTE — Telephone Encounter (Signed)
[name] is calling because he has been having severe chest pains around 3:00/4:00 am near his defib and also has severe pains in left arm and then pain shoots to right arm. [name] is experiencing shortness of breath with minimal activity. The shortness of breath started around the end of November.

## 2016-01-03 NOTE — Telephone Encounter (Signed)
LM to return call to device clinic

## 2016-01-07 ENCOUNTER — Encounter: Payer: Medicare Other | Admitting: Physician Assistant

## 2016-01-09 ENCOUNTER — Telehealth: Payer: Self-pay | Admitting: Cardiology

## 2016-01-10 NOTE — Telephone Encounter (Signed)
Patient is scheduled to see Tommye Standard, PA on 01/15/16.  Encounter closed.

## 2016-01-13 ENCOUNTER — Encounter: Payer: Self-pay | Admitting: Physician Assistant

## 2016-01-13 NOTE — Telephone Encounter (Signed)
Opened in error

## 2016-01-14 NOTE — Progress Notes (Signed)
Cardiology Office Note Date:  01/15/2016  Patient ID:  Stanley [name], DOB 10-08-1965, MRN [id number] PCP:  Mauricio Po, [place]  Electrophysiologist: Dr. Rayann Heman   Chief Complaint: planned f/u  History of Present Illness: Stanley [name] is a 51 y.o. male with history of NICM in 2006 (felt 2/2 HTN/ETOH), w/ICD, subsequently in 2011 apparently found with CAD and had PCI to unknown vessel, a cath in 2014 noted no significant CAD, depression w/ETOH abuse, eventually an ICD implant done in [place], 2015, HTN, gout comes in to the office today to be seen for Dr. Rayann Heman.  He was last seen by him (also his 1st visit) in June, at that time doing well, mentioned some CP though did not want to pursue w/u reporting having had a stress test in [place] [facility]) earlier in the year.  He c/w the same chest discomfort that he has had for the lat 4-5 years, it is not escalating or changing.  He had baseline DOE and nighttime SOB, has slept with 5 pillows for the last several years as well.  No near syncope or syncope, no shocks from his device, no palpitations.  He tells me he thought the ICD was supposed to make him feel better and is frustrated that he feels no different now for so many years.   Device information BSCi single chamber ICD implanted 06/05/13, in [place] Jan 2017, received inappropriate for ST with ATP (after a work out at [employer] and reported noncompliance with meds) that degenerated into VT and received several shocks   Past Medical History:  Diagnosis Date  . Acute renal failure (ARF) ([place])   . CAD (coronary artery disease)    PCI in 2011  . CHF (congestive heart failure) ([place])   . Gout   . Gouty arthritis   . Hypertension   . Insomnia   . Nonischemic cardiomyopathy ([place])   . Ventricular tachycardia ([place]) 01/2015   ATP delivered for sinus tach, degenerated into VT for which he received shock    Past Surgical History:  Procedure Laterality Date  . CARDIAC DEFIBRILLATOR PLACEMENT   06/05/13   [employer] Inogen ICD implanted at [facility] in [place] for primary prevention  . HERNIA REPAIR    . PERCUTANEOUS CORONARY STENT INTERVENTION (PCI-S)  2011    Current Outpatient Prescriptions  Medication Sig Dispense Refill  . allopurinol (ZYLOPRIM) 100 MG tablet TAKE 1 TABLET(100 MG) BY MOUTH DAILY 30 tablet 1  . buPROPion (WELLBUTRIN XL) 300 MG 24 hr tablet Take 300 mg by mouth daily.    . carvedilol (COREG) 25 MG tablet Take 1 tablet (25 mg total) by mouth 2 (two) times daily with a meal. 180 tablet 3  . colchicine 0.6 MG tablet Take 0.6 mg by mouth daily.    . digoxin (LANOXIN) 0.125 MG tablet Take 1 tablet (0.125 mg total) by mouth daily. 90 tablet 3  . furosemide (LASIX) 40 MG tablet Take 1 tablet (40 mg total) by mouth daily. 90 tablet 3  . nitroGLYCERIN (NITROSTAT) 0.4 MG SL tablet Place 0.4 mg under the tongue every 5 (five) minutes x 3 doses as needed for chest pain.    . potassium chloride SA (K-DUR,KLOR-CON) 20 MEQ tablet Take 1 tablet by mouth daily.  9  . traZODone (DESYREL) 100 MG tablet Take 1 tablet by mouth at bedtime.  2  . valsartan (DIOVAN) 160 MG tablet Take 1 tablet (160 mg total) by mouth 2 (two) times daily. 180 tablet  3   No current facility-administered medications for this visit.     Allergies:   Apple; Lisinopril; Other; and Shellfish allergy   Social History:  The patient  reports that he has never smoked. He has never used smokeless tobacco. He reports that he does not drink alcohol or use drugs.   Family History:  The patient's family history includes Alzheimer's disease in his father; Cancer in his mother; Dementia in his father; Gout in his father, maternal grandfather, and paternal grandfather; Heart disease in his maternal grandmother; Hypertension in his father.  ROS:  Please see the history of present illness.  All other systems are reviewed and otherwise negative.   PHYSICAL EXAM:  VS:  BP (!) 163/94   Pulse 73    Ht 5' 5.5" (1.664 m)   Wt 175 lb (79.4 kg)   BMI 28.68 kg/m  BMI: Body mass index is 28.68 kg/m. Well nourished, well developed, in no acute distress  HEENT: normocephalic, atraumatic  Neck: no JVD, carotid bruits or masses Cardiac:  RRR; no significant murmurs, no rubs, or gallops Lungs:  clear to auscultation bilaterally, no wheezing, rhonchi or rales  Abd: soft, nontender MS: no deformity or atrophy Ext: no edema  Skin: warm and dry, no rash Neuro:  No gross deficits appreciated Psych: euthymic mood, full affect  ICD site is stable, no tethering or discomfort   EKG:  Done today and reviewed by myself is SR, appears unchanged from previous ICD interrogation done today and reviewed by myself notes stable lead and battery status, no events  05/08/13: TTE LVEF 30-35%, mod LA dilation Posteriorly directed eccentric severe MR Mod TE RVSP 51mmHg 08/03/12: TTE LVEF 30-35% Mod LA dilation Eccentric mod MR, mild TR 09/15/12: [facility], [place] [facility] Normal coronaries, EF 30%   Recent Labs: 06/20/2015: Hemoglobin 13.0; Platelets 297 09/25/2015: ALT 22; BUN 28; Creatinine, Ser 1.35; Potassium 4.0; Sodium 141  No results found for requested labs within last 8760 hours.   CrCl cannot be calculated (Patient's most recent lab result is older than the maximum 21 days allowed.).   Wt Readings from Last 3 Encounters:  01/15/16 175 lb (79.4 kg)  09/25/15 158 lb (71.7 kg)  07/03/15 152 lb 3.2 oz (69 kg)     Other studies reviewed: Additional studies/records reviewed today include: summarized above  ASSESSMENT AND PLAN:  1. NICM, chronic CHF    Hx of noncompliance and ETOH    fluid status appears stable, his exam is negative for fluid OL and symptoms are at his baseline unchanged for years    On BB, ARB, diuretic  He is on 360mg  daily of the diovan, will stop this and start Entresto at 49/51mg  BID dose Report compliance with his medicines  2. CP     Normal cath in 2014 (with  the same symptoms he continues to have)     Reportedly had a stress test in 2017?? [facility]     Given chronic nature of his symptoms, will try to get his reports again  3. HTN     A little high, will see how he does with the Entresto  4. Severe MR on his last available echo      Given no change in his baseline symptoms, will again try to get his last echo from [facility], though if unable will repeat  5. ETOH     He states only a few drinks on the weekends, not nearly aas much as he did historically.  Disposition:  Will get a BMET in 2 weeks after change in medicine as well as a dig level, I have asked medical records visit with the patient to make sure we have a signed auth for records and the correct facility in [place].  Will see him back in 3 months, to assess his response to changes, sooner if needed  Current medicines are reviewed at length with the patient today.  The patient did not have any concerns regarding medicines.  Haywood Lasso, PA-C 01/15/2016 2:39 PM     [place] [address] [phone] (office)  [phone] (fax)

## 2016-01-15 ENCOUNTER — Ambulatory Visit (INDEPENDENT_AMBULATORY_CARE_PROVIDER_SITE_OTHER): Payer: Medicare Other | Admitting: Physician Assistant

## 2016-01-15 ENCOUNTER — Encounter (INDEPENDENT_AMBULATORY_CARE_PROVIDER_SITE_OTHER): Payer: Self-pay

## 2016-01-15 VITALS — BP 163/94 | HR 73 | Ht 65.5 in | Wt 175.0 lb

## 2016-01-15 DIAGNOSIS — Z95810 Presence of automatic (implantable) cardiac defibrillator: Secondary | ICD-10-CM | POA: Diagnosis not present

## 2016-01-15 DIAGNOSIS — I420 Dilated cardiomyopathy: Secondary | ICD-10-CM | POA: Diagnosis not present

## 2016-01-15 DIAGNOSIS — I25118 Atherosclerotic heart disease of native coronary artery with other forms of angina pectoris: Secondary | ICD-10-CM | POA: Diagnosis not present

## 2016-01-15 DIAGNOSIS — Z5181 Encounter for therapeutic drug level monitoring: Secondary | ICD-10-CM | POA: Diagnosis not present

## 2016-01-15 DIAGNOSIS — I10 Essential (primary) hypertension: Secondary | ICD-10-CM

## 2016-01-15 DIAGNOSIS — R079 Chest pain, unspecified: Secondary | ICD-10-CM

## 2016-01-15 DIAGNOSIS — I340 Nonrheumatic mitral (valve) insufficiency: Secondary | ICD-10-CM | POA: Diagnosis not present

## 2016-01-15 MED ORDER — SACUBITRIL-VALSARTAN 49-51 MG PO TABS: 1.0000 | ORAL_TABLET | Freq: Two times a day (BID) | ORAL | 3 refills | Status: DC

## 2016-01-15 MED ORDER — DIGOXIN 125 MCG PO TABS: 0.1250 mg | ORAL_TABLET | Freq: Every day | ORAL | 3 refills | Status: DC

## 2016-01-15 NOTE — Patient Instructions (Addendum)
Medication Instructions:   STOP TAKING VALSARTAN   START TAKING ENTRESTO 49/51 MG TWICE A DAY   YOUR DIGOXIN PRESCRIPTION WAS FILLED AND SENT INTO PHARMACY    If you need a refill on your cardiac medications before your next appointment, please call your pharmacy.  Labwork: BMET AND DIGOXIN IN 2 WEEKS  01/29/2016   Testing/Procedures: NONE ORDERED  TODAY    Follow-Up:  IN 3 MONTHS  WITH RENEE  ( IF NOT OPE RECALL PLEASE)   Any Other Special Instructions Will Be Listed Below (If Applicable).

## 2016-01-17 ENCOUNTER — Telehealth: Payer: Self-pay

## 2016-01-17 NOTE — Telephone Encounter (Signed)
Prior auth obtained for Entresto 49-51mg  from [place]. Local pharmacy notified.

## 2016-01-20 ENCOUNTER — Telehealth: Payer: Self-pay

## 2016-01-20 NOTE — Telephone Encounter (Signed)
Entresto 49-51 approved by [facility] . This is valid through 01/16/2018.

## 2016-01-21 ENCOUNTER — Other Ambulatory Visit: Payer: Self-pay | Admitting: *Deleted

## 2016-01-21 DIAGNOSIS — I519 Heart disease, unspecified: Secondary | ICD-10-CM

## 2016-01-29 ENCOUNTER — Other Ambulatory Visit: Payer: Self-pay | Admitting: *Deleted

## 2016-01-29 DIAGNOSIS — Z5181 Encounter for therapeutic drug level monitoring: Secondary | ICD-10-CM

## 2016-01-29 DIAGNOSIS — I482 Chronic atrial fibrillation: Secondary | ICD-10-CM | POA: Diagnosis not present

## 2016-01-30 LAB — BASIC METABOLIC PANEL
BUN / CREAT RATIO: 11 (ref 9–20)
BUN: 11 mg/dL (ref 6–24)
CHLORIDE: 100 mmol/L (ref 96–106)
CO2: 19 mmol/L (ref 18–29)
Calcium: 8.9 mg/dL (ref 8.7–10.2)
Creatinine, Ser: 1.01 mg/dL (ref 0.76–1.27)
GFR calc Af Amer: 98 mL/min/{1.73_m2} (ref >59)
GFR calc non Af Amer: 85 mL/min/{1.73_m2} (ref >59)
GLUCOSE: 133 mg/dL — AB (ref 65–99)
Potassium: 3.5 mmol/L (ref 3.5–5.2)
Sodium: 139 mmol/L (ref 134–144)

## 2016-01-30 LAB — DIGOXIN LEVEL

## 2016-02-21 ENCOUNTER — Ambulatory Visit (HOSPITAL_COMMUNITY): Payer: Medicare Other

## 2016-03-09 ENCOUNTER — Ambulatory Visit (HOSPITAL_COMMUNITY): Payer: Self-pay | Attending: Cardiovascular Disease

## 2016-03-09 ENCOUNTER — Other Ambulatory Visit: Payer: Self-pay

## 2016-03-09 DIAGNOSIS — I519 Heart disease, unspecified: Secondary | ICD-10-CM | POA: Insufficient documentation

## 2016-03-09 DIAGNOSIS — I340 Nonrheumatic mitral (valve) insufficiency: Secondary | ICD-10-CM | POA: Insufficient documentation

## 2016-03-09 LAB — ECHOCARDIOGRAM COMPLETE
Ao-asc: 31 cm
CHL CUP PV REG GRAD DIAS: 7 mmHg
CHL CUP REG VEL DIAS: 130 cm/s
E decel time: 148 msec
EERAT: 10.31
FS: 23 % — AB (ref 28–44)
IV/PV OW: 0.97
LA ID, A-P, ES: 47 mm
LA diam index: 2.5 cm/m2
LA vol A4C: 30.8 ml
LA vol: 36.8 mL
LAVOLIN: 19.6 mL/m2
LDCA: 3.46 cm2
LEFT ATRIUM END SYS DIAM: 47 mm
LV PW d: 11 mm — AB (ref 0.6–1.1)
LV TDI E'LATERAL: 8.59
LV TDI E'MEDIAL: 3.59
LVEEAVG: 10.31
LVEEMED: 10.31
LVELAT: 8.59 cm/s
LVOT VTI: 18.6 cm
LVOT diameter: 21 mm
LVOTPV: 110 cm/s
LVOTSV: 64 mL
MV Dec: 148
MV pk A vel: 113 m/s
MV pk E vel: 88.6 m/s
MVPG: 3 mmHg
RV LATERAL S' VELOCITY: 9.25 cm/s
RV TAPSE: 19.9 mm
RV sys press: 34 mmHg
Reg peak vel: 280 cm/s
TR max vel: 280 cm/s
VTI: 201 cm

## 2016-05-22 ENCOUNTER — Telehealth: Payer: Self-pay | Admitting: *Deleted

## 2016-05-22 NOTE — Telephone Encounter (Signed)
Received patient as a walk in requesting echo results, to make f/ua ppt, and c/o intermittent CP at device site.    Echo results given to patient. Patient reports intermittent chest pain left sided at device at night with dizziness and some vomiting.  Denies CP, SOB, dizziness, N/V at current.   Reports compliance with all medications.  Denies shocks from device or palpitations  Due for f/u with Tommye Standard PA-appt made for Tuesday 5/22 at 9:30AM.    Patient aware and verbalized understanding.  Advised to proceed to ER if symptoms persist or worsen.  Patient aware.

## 2016-05-25 NOTE — Progress Notes (Addendum)
Cardiology Office Note Date:  05/26/2016  Patient ID:  Stanley [name], DOB 1965/12/02, MRN [id number] PCP:  [place], FNP  Electrophysiologist: Dr. Rayann Heman   Chief Complaint: CP  History of Present Illness: Stanley [name] is a 51 y.o. male with history of NICM in 2006 (felt 2/2 HTN/ETOH), w/ICD, subsequently in 2011 apparently found with CAD and had PCI to unknown vessel, a cath in 2014 noted no significant CAD, depression w/ETOH abuse, eventually an ICD implant done in [place], 2015, HTN, gout   Stanley [name] comes in to the office today to be seen for Dr. Rayann Heman.  Stanley [name] was last seen by him (also his 1st visit) in June 21017 at that time doing well, mentioned some CP though did not want to pursue w/u reporting having had a stress test in [place] [facility]) earlier in the year.  More recently seen by myself in January, at that visit Stanley [name] c/w the same chronic sounding chest discomfort, and baseline SOB.  Stanley [name] was switched from his ARB to [facility].  Stanley [name] had an echo done noting LVEF 35-40%, now WMA, and only mild MR  Stanley [name] c/w the same chest discomfort that Stanley [name] has had for the lat 4-5 years, it is not escalating or changing, is random can radiate to left or right arm, other times no radiation, no associated symptoms with it.  Stanley [name] had baseline DOE, today denies any nighttime SOB, Stanley [name] has slept with 5 pillows for the last several years as well.  Stanley [name] mentions that when lying in bed Stanley [name] feels more aware of his ICD, feels like it sticks out more, or feels the pressure of the device laying on his chest. No near syncope or syncope, no shocks from his device, no palpitations.  There is mention of a dizzy spell, Stanley [name] reports this was on a day that had been very hot outside and occurred upon standing, was brief and self limited, not recurrent.  (This does not correlate with any device findings)   Device information BSCi single chamber ICD implanted 06/05/13, in [place] Jan 2017, received inappropriate for ST with ATP (after a  work out at [employer] and reported noncompliance with meds) that degenerated into VT and received several shocks   Past Medical History:  Diagnosis Date  . Acute renal failure (ARF) ([place])   . CAD (coronary artery disease)    PCI in 2011  . CHF (congestive heart failure) ([place])   . Gout   . Gouty arthritis   . Hypertension   . Insomnia   . Nonischemic cardiomyopathy ([place])   . Ventricular tachycardia ([place]) 01/2015   ATP delivered for sinus tach, degenerated into VT for which Stanley [name] received shock    Past Surgical History:  Procedure Laterality Date  . CARDIAC DEFIBRILLATOR PLACEMENT  06/05/13   [employer] Inogen ICD implanted at [facility] in [place] for primary prevention  . HERNIA REPAIR    . PERCUTANEOUS CORONARY STENT INTERVENTION (PCI-S)  2011    Current Outpatient Prescriptions  Medication Sig Dispense Refill  . allopurinol (ZYLOPRIM) 100 MG tablet TAKE 1 TABLET(100 MG) BY MOUTH DAILY 30 tablet 1  . buPROPion (WELLBUTRIN XL) 300 MG 24 hr tablet Take 300 mg by mouth daily.    . carvedilol (COREG) 25 MG tablet Take 1 tablet (25 mg total) by mouth 2 (two) times daily with a meal. 180 tablet 3  . colchicine 0.6 MG tablet Take 0.6 mg by mouth daily.    . digoxin (LANOXIN) 0.125  MG tablet Take 1 tablet (0.125 mg total) by mouth daily. 90 tablet 3  . furosemide (LASIX) 40 MG tablet Take 1 tablet (40 mg total) by mouth daily. 90 tablet 3  . nitroGLYCERIN (NITROSTAT) 0.4 MG SL tablet Place 0.4 mg under the tongue every 5 (five) minutes x 3 doses as needed for chest pain.    . potassium chloride SA (K-DUR,KLOR-CON) 20 MEQ tablet Take 1 tablet by mouth daily.  9  . traZODone (DESYREL) 100 MG tablet Take 1 tablet by mouth at bedtime.  2  . sacubitril-valsartan (ENTRESTO) 97-103 MG Take 1 tablet by mouth 2 (two) times daily. 60 tablet 5   No current facility-administered medications for this visit.     Allergies:   Apple; Lisinopril; Other; and Shellfish allergy   Social  History:  The patient  reports that Stanley [name] has never smoked. Stanley [name] has never used smokeless tobacco. Stanley [name] reports that Stanley [name] does not drink alcohol or use drugs.   Family History:  The patient's family history includes Alzheimer's disease in his father; Cancer in his mother; Dementia in his father; Gout in his father, maternal grandfather, and paternal grandfather; Heart disease in his maternal grandmother; Hypertension in his father.  ROS:  Please see the history of present illness.  All other systems are reviewed and otherwise negative.   PHYSICAL EXAM:  VS:  BP (!) 154/98   Pulse 88   Ht 5' 5.5" (1.664 m)   Wt 168 lb (76.2 kg)   BMI 27.53 kg/m  BMI: Body mass index is 27.53 kg/m. Well nourished, well developed, in no acute distress  HEENT: normocephalic, atraumatic  Neck: no JVD, carotid bruits or masses Cardiac:  RRR; no significant murmurs, no rubs, or gallops Lungs:  CTA b/l, no wheezing, rhonchi or rales  Abd: soft, nontender MS: no deformity or atrophy Ext:  no edema  Skin: warm and dry, no rash Neuro:  No gross deficits appreciated Psych: euthymic mood, full affect  ICD site is stable, no tethering or discomfort   EKG:  Done 01/16/16 was SR, appeared unchanged from previous ICD interrogation done today and reviewed by myself notes stable lead and battery status, 4 VT events appear to be a ST/SVT with the same morphology as his baseline occurred in his monitor zone 2 events on 2 days, last in Feb.  03/09/16: TTE Study Conclusions - Left ventricle: mid and basal inferior wall septal hypokinesis   The cavity size was moderately dilated. Wall thickness was   normal. Systolic function was moderately reduced. The estimated   ejection fraction was in the range of 35% to 40%. Wall motion was   normal; there were no regional wall motion abnormalities. Doppler   parameters are consistent with abnormal left ventricular   relaxation (grade 1 diastolic dysfunction). - Mitral valve: There was  mild regurgitation. - Left atrium: The atrium was moderately dilated.28mm - Atrial septum: No defect or patent foramen ovale was identified. - Pulmonary arteries: PA peak pressure: 34 mm Hg (S).  05/08/13: TTE LVEF 30-35%, mod LA dilation Posteriorly directed eccentric severe MR Mod TE RVSP 53mmHg 08/03/12: TTE LVEF 30-35% Mod LA dilation Eccentric mod MR, mild TR 09/15/12: [facility], [place] [facility] Normal coronaries, EF 30%   Recent Labs: 06/20/2015: Hemoglobin 13.0; Platelets 297 09/25/2015: ALT 22 01/29/2016: BUN 11; Creatinine, Ser 1.01; Potassium 3.5; Sodium 139  No results found for requested labs within last 8760 hours.   CrCl cannot be calculated (Patient's most recent lab result is older  than the maximum 21 days allowed.).   Wt Readings from Last 3 Encounters:  05/26/16 168 lb (76.2 kg)  01/15/16 175 lb (79.4 kg)  09/25/15 158 lb (71.7 kg)     Other studies reviewed: Additional studies/records reviewed today include: summarized above  ASSESSMENT AND PLAN:  1. NICM, chronic CHF    Hx of noncompliance and ETOH    fluid status appears stable, his weight is stable/down, his exam is negative for fluid OL and symptoms are at his baseline unchanged for years    On BB, ARB, diuretic  2. CP     Normal cath in 2014 (with the same symptoms Stanley [name] continues to have)     Reportedly had a stress test in 2017?? [facility], we have been unable to obtain     Given chronic nature of his symptoms, doubt this is an anginal c/o, the patient would like this worked up, will schedule him for stress test  3. HTN,hypertensive heart disease     Uncontrolled, will increase his Entresto to 97/103mg  BID and check BMET today  4. Severe MR      Most recent echo is mild  5. ETOH     Stanley [name] states only a few drinks on the weekends, not nearly as much as Stanley [name] did historically.     Re-discussed today  Disposition: will see him back in 3 months, pending his stress findings, sooner if needeed.  Current  medicines are reviewed at length with the patient today.  The patient did not have any concerns regarding medicines.  Haywood Lasso, PA-C 05/26/2016 12:36 PM     [place] [address] [phone] (office)  [phone] (fax)

## 2016-05-26 ENCOUNTER — Ambulatory Visit (INDEPENDENT_AMBULATORY_CARE_PROVIDER_SITE_OTHER): Payer: Self-pay | Admitting: Physician Assistant

## 2016-05-26 VITALS — BP 154/98 | HR 88 | Ht 65.5 in | Wt 168.0 lb

## 2016-05-26 DIAGNOSIS — I428 Other cardiomyopathies: Secondary | ICD-10-CM

## 2016-05-26 DIAGNOSIS — R079 Chest pain, unspecified: Secondary | ICD-10-CM

## 2016-05-26 DIAGNOSIS — I519 Heart disease, unspecified: Secondary | ICD-10-CM

## 2016-05-26 DIAGNOSIS — Z95810 Presence of automatic (implantable) cardiac defibrillator: Secondary | ICD-10-CM

## 2016-05-26 DIAGNOSIS — I110 Hypertensive heart disease with heart failure: Secondary | ICD-10-CM

## 2016-05-26 DIAGNOSIS — I5022 Chronic systolic (congestive) heart failure: Secondary | ICD-10-CM

## 2016-05-26 LAB — BASIC METABOLIC PANEL
BUN/Creatinine Ratio: 12 (ref 9–20)
BUN: 14 mg/dL (ref 6–24)
CALCIUM: 9.8 mg/dL (ref 8.7–10.2)
CO2: 19 mmol/L (ref 18–29)
CREATININE: 1.2 mg/dL (ref 0.76–1.27)
Chloride: 105 mmol/L (ref 96–106)
GFR calc Af Amer: 80 mL/min/{1.73_m2} (ref >59)
GFR, EST NON AFRICAN AMERICAN: 69 mL/min/{1.73_m2} (ref >59)
GLUCOSE: 110 mg/dL — AB (ref 65–99)
Potassium: 3.8 mmol/L (ref 3.5–5.2)
SODIUM: 143 mmol/L (ref 134–144)

## 2016-05-26 MED ORDER — SACUBITRIL-VALSARTAN 97-103 MG PO TABS: 1.0000 | ORAL_TABLET | Freq: Two times a day (BID) | ORAL | 5 refills | Status: DC

## 2016-05-26 NOTE — Patient Instructions (Addendum)
Medication Instructions:   START TAKING ENTRESTO  91/105 TWICE A DAY.    If you need a refill on your cardiac medications before your next appointment, please call your pharmacy.  Labwork: BMET TODAY     Testing/Procedures: Your physician has requested that you have a lexiscan myoview. For further information please visit [email or web address]. Please follow instruction sheet, as given.      Follow-Up: IN 3 MONTHS WITH URSUY   Any Other Special Instructions Will Be Listed Below (If Applicable).

## 2016-05-28 ENCOUNTER — Telehealth (HOSPITAL_COMMUNITY): Payer: Self-pay | Admitting: *Deleted

## 2016-05-28 NOTE — Telephone Encounter (Signed)
Patient given detailed instructions per Myocardial Perfusion Study Information Sheet for the test on 06/03/16. Patient notified to arrive 15 minutes early and that it is imperative to arrive on time for appointment to keep from having the test rescheduled.  If you need to cancel or reschedule your appointment, please call the office within 24 hours of your appointment. . Patient verbalized understanding. Kirstie Peri

## 2016-06-03 ENCOUNTER — Ambulatory Visit (HOSPITAL_COMMUNITY): Payer: Self-pay | Attending: Cardiovascular Disease

## 2016-06-03 DIAGNOSIS — R079 Chest pain, unspecified: Secondary | ICD-10-CM | POA: Insufficient documentation

## 2016-06-03 DIAGNOSIS — I252 Old myocardial infarction: Secondary | ICD-10-CM | POA: Insufficient documentation

## 2016-06-03 LAB — MYOCARDIAL PERFUSION IMAGING
CHL CUP NUCLEAR SDS: 4
CHL CUP NUCLEAR SRS: 5
CHL CUP RESTING HR STRESS: 67 {beats}/min
LV dias vol: 165 mL (ref 62–150)
LVSYSVOL: 89 mL
NUC STRESS TID: 0.97
Peak HR: 117 {beats}/min
RATE: 0.25
SSS: 7

## 2016-06-03 MED ORDER — TECHNETIUM TC 99M TETROFOSMIN IV KIT
32.9000 | PACK | Freq: Once | INTRAVENOUS | Status: AC | PRN
  Administered 2016-06-03: 32.9 via INTRAVENOUS
  Filled 2016-06-03: qty 33

## 2016-06-03 MED ORDER — TECHNETIUM TC 99M TETROFOSMIN IV KIT
10.6000 | PACK | Freq: Once | INTRAVENOUS | Status: AC | PRN
  Administered 2016-06-03: 10.6 via INTRAVENOUS
  Filled 2016-06-03: qty 11

## 2016-06-03 MED ORDER — REGADENOSON 0.4 MG/5ML IV SOLN
0.4000 mg | Freq: Once | INTRAVENOUS | Status: AC
  Administered 2016-06-03: 0.4 mg via INTRAVENOUS

## 2016-06-09 ENCOUNTER — Telehealth: Payer: Self-pay | Admitting: Physician Assistant

## 2016-06-09 ENCOUNTER — Other Ambulatory Visit: Payer: Self-pay | Admitting: *Deleted

## 2016-06-09 DIAGNOSIS — I428 Other cardiomyopathies: Secondary | ICD-10-CM

## 2016-06-09 DIAGNOSIS — R079 Chest pain, unspecified: Secondary | ICD-10-CM

## 2016-06-09 DIAGNOSIS — R9439 Abnormal result of other cardiovascular function study: Secondary | ICD-10-CM

## 2016-06-09 NOTE — Telephone Encounter (Signed)
I discussed the patient's stress test result with him.  He has had cardiac cath before in review of that report was done 2/2 an abnormal stress test finding normal coronaries.  Unfortunately we do not have that stress test to compare with to know if the same abnormalites.  Given the stress test is not a high risk test result and his CP has a chronic component to it, I discussed a coronary/cardiac CT angio be done as a first step rather then proceeding with cath/invasive procedure.  The patient is agreeable and comfortable with that plan.    Should he have changing/escalating symptoms he is instructed to seek medical attention.  Tommye Standard, PA-C

## 2016-06-11 ENCOUNTER — Other Ambulatory Visit: Payer: Self-pay

## 2016-06-11 DIAGNOSIS — R9439 Abnormal result of other cardiovascular function study: Secondary | ICD-10-CM | POA: Diagnosis not present

## 2016-06-11 DIAGNOSIS — I428 Other cardiomyopathies: Secondary | ICD-10-CM

## 2016-06-11 DIAGNOSIS — R079 Chest pain, unspecified: Secondary | ICD-10-CM

## 2016-06-11 LAB — BASIC METABOLIC PANEL
BUN/Creatinine Ratio: 14 (ref 9–20)
BUN: 15 mg/dL (ref 6–24)
CO2: 18 mmol/L (ref 18–29)
CREATININE: 1.09 mg/dL (ref 0.76–1.27)
Calcium: 9.6 mg/dL (ref 8.7–10.2)
Chloride: 102 mmol/L (ref 96–106)
GFR calc Af Amer: 90 mL/min/{1.73_m2} (ref >59)
GFR, EST NON AFRICAN AMERICAN: 78 mL/min/{1.73_m2} (ref >59)
Glucose: 98 mg/dL (ref 65–99)
Potassium: 4.1 mmol/L (ref 3.5–5.2)
SODIUM: 135 mmol/L (ref 134–144)

## 2016-06-30 ENCOUNTER — Encounter: Payer: Self-pay | Admitting: Internal Medicine

## 2016-07-15 ENCOUNTER — Ambulatory Visit (HOSPITAL_COMMUNITY)
Admission: RE | Admit: 2016-07-15 | Discharge: 2016-07-15 | Disposition: A | Discharge status: Home / Self Care | Payer: Self-pay | Source: Ambulatory Visit | Attending: Physician Assistant | Admitting: Physician Assistant

## 2016-07-15 ENCOUNTER — Encounter (HOSPITAL_COMMUNITY): Payer: Self-pay

## 2016-07-15 DIAGNOSIS — R9439 Abnormal result of other cardiovascular function study: Secondary | ICD-10-CM

## 2016-07-15 DIAGNOSIS — R079 Chest pain, unspecified: Secondary | ICD-10-CM

## 2016-07-15 DIAGNOSIS — I428 Other cardiomyopathies: Secondary | ICD-10-CM

## 2016-07-15 MED ORDER — IOPAMIDOL (ISOVUE-370) INJECTION 76%
INTRAVENOUS | Status: AC
  Administered 2016-07-15: 80 mL
  Filled 2016-07-15: qty 100

## 2016-07-15 MED ORDER — NITROGLYCERIN 0.4 MG SL SUBL
SUBLINGUAL_TABLET | SUBLINGUAL | Status: AC
  Administered 2016-07-15: 0.8 mg via SUBLINGUAL
  Filled 2016-07-15: qty 2

## 2016-07-15 MED ORDER — METOPROLOL TARTRATE 5 MG/5ML IV SOLN
INTRAVENOUS | Status: AC
  Filled 2016-07-15: qty 10

## 2016-07-15 MED ORDER — METOPROLOL TARTRATE 5 MG/5ML IV SOLN
5.0000 mg | INTRAVENOUS | Status: DC
  Administered 2016-07-15 (×2): 5 mg via INTRAVENOUS

## 2016-07-15 MED ORDER — NITROGLYCERIN 0.4 MG SL SUBL
0.8000 mg | SUBLINGUAL_TABLET | Freq: Once | SUBLINGUAL | Status: AC
  Administered 2016-07-15: 0.8 mg via SUBLINGUAL

## 2016-07-15 MED ORDER — METOPROLOL TARTRATE 5 MG/5ML IV SOLN
INTRAVENOUS | Status: AC
  Administered 2016-07-15: 5 mg via INTRAVENOUS
  Filled 2016-07-15: qty 5

## 2016-07-17 ENCOUNTER — Telehealth: Payer: Self-pay | Admitting: *Deleted

## 2016-07-17 NOTE — Telephone Encounter (Signed)
PT AWARE OF RESULTS 

## 2016-08-24 ENCOUNTER — Ambulatory Visit: Payer: Self-pay | Admitting: Physician Assistant

## 2016-08-26 ENCOUNTER — Ambulatory Visit (INDEPENDENT_AMBULATORY_CARE_PROVIDER_SITE_OTHER): Payer: Self-pay | Admitting: Physician Assistant

## 2016-08-26 ENCOUNTER — Encounter: Payer: Self-pay | Admitting: Physician Assistant

## 2016-08-26 ENCOUNTER — Other Ambulatory Visit: Payer: Self-pay | Admitting: Internal Medicine

## 2016-08-26 VITALS — BP 152/78 | HR 90 | Ht 65.0 in | Wt 181.4 lb

## 2016-08-26 DIAGNOSIS — I10 Essential (primary) hypertension: Secondary | ICD-10-CM

## 2016-08-26 DIAGNOSIS — I428 Other cardiomyopathies: Secondary | ICD-10-CM

## 2016-08-26 DIAGNOSIS — R0789 Other chest pain: Secondary | ICD-10-CM

## 2016-08-26 MED ORDER — FUROSEMIDE 80 MG PO TABS: 80.0000 mg | ORAL_TABLET | Freq: Every day | ORAL | 3 refills | Status: DC

## 2016-08-26 NOTE — Patient Instructions (Signed)
Medication Instructions:  Your physician recommends that you continue on your current medications as directed. Please refer to the Current Medication list given to you today.   Labwork: Your physician recommends that you return for lab work today for BMET, MAGNESIUM LEVEL  Testing/Procedures: None Ordered   Follow-Up: Remote monitoring is used to monitor your Pacemaker of ICD from home. This monitoring reduces the number of office visits required to check your device to one time per year. It allows [place] to keep an eye on the functioning of your device to ensure it is working properly. You are scheduled for a device check from home on 11/25/16. You may send your transmission at any time that day. If you have a wireless device, the transmission will be sent automatically. After your physician reviews your transmission, you will receive a postcard with your next transmission date.   Your physician wants you to follow-up in: 6 months with Tommye Standard. You will receive a reminder letter in the mail two months in advance. If you don't receive a letter, please call our office to schedule the follow-up appointment.   Any Other Special Instructions Will Be Listed Below (If Applicable).  Please check your daily morning weights.   If you need a refill on your cardiac medications before your next appointment, please call your pharmacy.

## 2016-08-26 NOTE — Progress Notes (Signed)
Cardiology Office Note Date:  08/26/2016  Patient ID:  Stanley [name], Alferd Apa 1965/04/18, MRN [id number] PCP:  Golden Circle, FNP  Electrophysiologist: Dr. Rayann Heman   Chief Complaint: planned f/u  History of Present Illness: Stanley [name] is a 51 y.o. male with history of NICM in 2006 (felt 2/2 HTN/ETOH), w/ICD, subsequently in 2011 apparently found with CAD and had PCI to unknown vessel, a cath in 2014 noted no significant CAD, depression w/ETOH abuse, eventually an ICD implant done in [place], 2015, HTN, gout   He comes in to the office today to be seen for Dr. Rayann Heman.  He was last seen by him (also his 1st visit) in June 21017 at that time doing well, mentioned some CP though did not want to pursue w/u reporting having had a stress test in [place] [facility]) earlier in the year.   He was seen most recently by myself in May, c/w the same chest discomfort that he has had for the lat 4-5 years, it is not escalating or changing, is random can radiate to left or right arm, other times no radiation, no associated symptoms with it.  He had baseline DOE,  denied any nighttime SOB, he has slept with 5 pillows for the last several years as well.  he was sent for stress testing  That was abnormal, given hx [place] of [place], CT was decided with ca++ zero and normal coronaries   He continues with the same diffuse chest discomfort as above.  No palpitations, no near syncope or syncope.  No rest SOB, waxing/waning DOE unchanged.  He felt like he was bloated in July and started to take 80mg  daily of lasix with little improvement.  He uses salt substitute rather then adding salt, doesn't feel like his diet is heavy in sodium, does not do daily weights.  When laying on his left side he feels like his ICD site is sore, this dates back to time of implant.  No fever, illness, pain otherwise    Device information BSCi single chamber ICD implanted 06/05/13, in [place] Jan  2017, received inappropriate for ST with ATP (after a work out at [employer] and reported noncompliance with meds) that degenerated into VT and received several shocks   Past Medical History:  Diagnosis Date  . Acute renal failure (ARF) ([place])   . CAD (coronary artery disease)    PCI in 2011  . CHF (congestive heart failure) ([place])   . Gout   . Gouty arthritis   . Hypertension   . Insomnia   . Nonischemic cardiomyopathy ([place])   . Ventricular tachycardia ([place]) 01/2015   ATP delivered for sinus tach, degenerated into VT for which he received shock    Past Surgical History:  Procedure Laterality Date  . CARDIAC DEFIBRILLATOR PLACEMENT  06/05/13   [employer] Inogen ICD implanted at [facility] in [place] for primary prevention  . HERNIA REPAIR    . PERCUTANEOUS CORONARY STENT INTERVENTION (PCI-S)  2011    Current Outpatient Prescriptions  Medication Sig Dispense Refill  . allopurinol (ZYLOPRIM) 100 MG tablet TAKE 1 TABLET(100 MG) BY MOUTH DAILY 30 tablet 1  . buPROPion (WELLBUTRIN XL) 300 MG 24 hr tablet Take 300 mg by mouth daily.    . carvedilol (COREG) 25 MG tablet Take 1 tablet (25 mg total) by mouth 2 (two) times daily with a meal. 180 tablet 3  . digoxin (LANOXIN) 0.125 MG tablet Take  1 tablet (0.125 mg total) by mouth daily. 90 tablet 3  . furosemide (LASIX) 40 MG tablet Take 1 tablet (40 mg total) by mouth daily. (Patient taking differently: Take 80 mg by mouth daily. ) 90 tablet 3  . nitroGLYCERIN (NITROSTAT) 0.4 MG SL tablet Place 0.4 mg under the tongue every 5 (five) minutes x 3 doses as needed for chest pain.    . potassium chloride SA (K-DUR,KLOR-CON) 20 MEQ tablet Take 1 tablet by mouth daily.  9  . sacubitril-valsartan (ENTRESTO) 97-103 MG Take 1 tablet by mouth 2 (two) times daily. 60 tablet 5  . traZODone (DESYREL) 100 MG tablet Take 1 tablet by mouth at bedtime.  2   No current facility-administered medications for this visit.     Allergies:   Apple;  Lisinopril; Other; and Shellfish allergy   Social History:  The patient  reports that he has never smoked. He has never used smokeless tobacco. He reports that he does not drink alcohol or use drugs.   Family History:  The patient's family history includes Alzheimer's disease in his father; Cancer in his mother; Dementia in his father; Gout in his father, maternal grandfather, and paternal grandfather; Heart disease in his maternal grandmother; Hypertension in his father.  ROS:  Please see the history of present illness.  All other systems are reviewed and otherwise negative.   PHYSICAL EXAM:  VS:  BP (!) 152/78   Pulse 90   Ht 5\' 5"  (1.651 m)   Wt 181 lb 6.4 oz (82.3 kg)   BMI 30.19 kg/m  BMI: Body mass index is 30.19 kg/m. Well nourished, well developed, in no acute distress  HEENT: normocephalic, atraumatic  Neck: no JVD, carotid bruits or masses Cardiac:  RRR; no significant murmurs, no rubs, or gallops Lungs:  CTA b/l, no wheezing, rhonchi or rales  Abd: soft, non-tender, not distended MS: no deformity or atrophy Ext:  no edema  Skin: warm and dry, no rash Neuro:  No gross deficits appreciated Psych: euthymic mood, full affect  ICD site is stable, no tethering or discomfort   EKG:  Done 01/16/16 was SR, appeared unchanged from previous ICD interrogation done today and reviewed by myself notes battery and lead measurements are good.  He had a tachy episode in monitor zone, morphology appears to be same as baseline, likely ST, he did have 3 seconds of NSVT, rate did get into VF zone   07/15/16: Coronary CTA IMPRESSION: Calcium Score:  0 Coronary Arteries: Right dominant with no anomalies LM: Normal LAD:  Normal D1: Normal D2: Normal IM:  Large and normal Circumflex: Norma OM1:  Small and normal RCA:  Dominant and normal PDA: Normal PLA:  Normal 1) Normal right dominant coronary arteries 2) Calcium score 0  IMPRESSION: 1. No significant incidental noncardiac  findings are noted   06/03/16: Stress myoview  Nuclear stress EF: 46%.  There was no ST segment deviation noted during stress.  This is an intermediate risk study.  The left ventricular ejection fraction is mildly decreased (45-54%). 1. EF 46%, mild diffuse hypokinesis.  2. Primarily fixed large, moderate-intensity basal to apical inferior and inferoseptal perfusion defect.  This suggests prior infarction.  Primarily reversible small, mild basal to mid anterior perfusion defect.  This may be suggestive of ischemia, possibly in a diagonal territory.   Intermediate risk study.    03/09/16: TTE Study Conclusions - Left ventricle: mid and basal inferior wall septal hypokinesis   The cavity size was moderately dilated.  Wall thickness was   normal. Systolic function was moderately reduced. The estimated   ejection fraction was in the range of 35% to 40%. Wall motion was   normal; there were no regional wall motion abnormalities. Doppler   parameters are consistent with abnormal left ventricular   relaxation (grade 1 diastolic dysfunction). - Mitral valve: There was mild regurgitation. - Left atrium: The atrium was moderately dilated.71mm - Atrial septum: No defect or patent foramen ovale was identified. - Pulmonary arteries: PA peak pressure: 34 mm Hg (S).  05/08/13: TTE LVEF 30-35%, mod LA dilation Posteriorly directed eccentric severe MR Mod TE RVSP 3mmHg 08/03/12: TTE LVEF 30-35% Mod LA dilation Eccentric mod MR, mild TR 09/15/12: [facility], [place] [facility] Normal coronaries, EF 30%   Recent Labs: 09/25/2015: ALT 22 06/11/2016: BUN 15; Creatinine, Ser 1.09; Potassium 4.1; Sodium 135  No results found for requested labs within last 8760 hours.   CrCl cannot be calculated (Patient's most recent lab result is older than the maximum 21 days allowed.).   Wt Readings from Last 3 Encounters:  08/26/16 181 lb 6.4 oz (82.3 kg)  06/03/16 168 lb (76.2 kg)  05/26/16 168 lb (76.2 kg)       Other studies reviewed: Additional studies/records reviewed today include: summarized above  ASSESSMENT AND PLAN:  1. NICM, chronic CHF    Hx of noncompliance and ETOH    On BB, ARB, dig, diuretic  His exam does not suggest fluid OL though weight is up significantly. Discussed sodium and fluid restrictions, daily weights and to notify of rapid weight increase/signs/symptoms of fluid OL BMET and mag level today, he will continue 80mg  daily of lasix and for a week an additional 20mg  at night. Cautioned on self adjusting meds  2. CP (chronic)     Normal cath in 2014 (with the same symptoms he continues to have and a reported abnormal stress test)     Reportedly had a stress test in 2017?? [facility], we have been unable to obtain     Stress test here was abnormal as above, given hx followed by CTa without CAD  Recommend he see his PMD for evaluation into potential non-cardiac sources for his pain       3. HTN,hypertensive heart disease     Instructed to reduce sodium  4. Severe MR      Most recent echo is mild  5. ETOH     He states only a few drinks on the weekends, not nearly as much as he did historically.     discussed today  Disposition: 3 month remote device check, 6 months ROV otherwise, sooner if needed.  Current medicines are reviewed at length with the patient today.  The patient did not have any concerns regarding medicines.  Haywood Lasso, PA-C 08/26/2016 1:17 PM     [address] [place] [address] [phone] (office)  [phone] (fax)

## 2016-08-26 NOTE — Telephone Encounter (Signed)
    Patient Instructions by Claude Manges, CMA at 01/15/2016 2:00 PM   Author: Claude Manges, CMA Author Type: Certified Medical Assistant Filed: 01/15/2016 3:17 PM  Note Status: Addendum Cosign: Cosign Not Required Encounter Date: 01/15/2016  Editor: Claude Manges, [place] (Certified Medical Assistant)  Prior Versions: 1. Claude Manges, CMA (Certified Medical Assistant) at 01/15/2016 3:14 PM - Addendum   2. Claude Manges, CMA (Certified Medical Assistant) at 01/15/2016 2:44 PM - Signed    Medication Instructions:   STOP TAKING VALSARTAN   START TAKING ENTRESTO 49/51 MG TWICE A DAY   YOUR DIGOXIN PRESCRIPTION WAS FILLED AND SENT INTO PHARMACY

## 2016-08-27 LAB — BASIC METABOLIC PANEL
BUN/Creatinine Ratio: 10 (ref 9–20)
BUN: 12 mg/dL (ref 6–24)
CALCIUM: 9.4 mg/dL (ref 8.7–10.2)
CHLORIDE: 104 mmol/L (ref 96–106)
CO2: 21 mmol/L (ref 20–29)
CREATININE: 1.15 mg/dL (ref 0.76–1.27)
GFR calc non Af Amer: 72 mL/min/{1.73_m2} (ref >59)
GFR, EST AFRICAN AMERICAN: 84 mL/min/{1.73_m2} (ref >59)
Glucose: 88 mg/dL (ref 65–99)
Potassium: 3.8 mmol/L (ref 3.5–5.2)
Sodium: 143 mmol/L (ref 134–144)

## 2016-08-27 LAB — MAGNESIUM: MAGNESIUM: 2 mg/dL (ref 1.6–2.3)

## 2016-09-22 ENCOUNTER — Other Ambulatory Visit: Payer: Self-pay | Admitting: *Deleted

## 2016-09-22 NOTE — Telephone Encounter (Signed)
Patient called and requested a refill on his trazodone. He is very upset as he has been without this medication for days and he tells me that his pharmacy has not been able to reach our office. I made him aware that this is not a medication that a cardiologist normally refills but he stated that he does not have a pcp. He went on to say that Dr Rayann Heman and Tommye Standard have been refilling all of his medications including this one. Please advise. Thanks, MI

## 2016-10-08 ENCOUNTER — Other Ambulatory Visit: Payer: Self-pay | Admitting: *Deleted

## 2016-10-08 MED ORDER — DIGOXIN 125 MCG PO TABS: 0.1250 mg | ORAL_TABLET | Freq: Every day | ORAL | 3 refills | Status: DC

## 2016-10-09 ENCOUNTER — Ambulatory Visit: Payer: Self-pay | Admitting: Family Medicine

## 2016-10-12 ENCOUNTER — Ambulatory Visit: Payer: Self-pay | Admitting: Physician Assistant

## 2016-10-20 NOTE — Telephone Encounter (Signed)
F/u Message   *STAT* If patient is at the pharmacy, call can be transferred to refill team.   1. Which medications need to be refilled? (please list name of each medication and dose if known) Allpurinal 100mg  and trazodone 100mg    2. Which pharmacy/location (including street and city if local pharmacy) is medication to be sent to? [address]  3. Do they need a 30 day or 90 day supply? [place]

## 2016-10-20 NOTE — Telephone Encounter (Signed)
Please advise on refill of non cardiac meds. Also, see 09/22/16 open phone note which is why he is calling here to request these.  Thanks, MI

## 2016-10-21 NOTE — Telephone Encounter (Signed)
LMOVM  OF IMPORTANCE OF FINDING A PCP TO BE ABLE TO CONTINUE TO GET RX FILLED THEY ARE NOT CARDIAC RELATED MEDICATIONS.   PT WAS INSTRUCTED TO CALL BACK

## 2016-11-11 ENCOUNTER — Telehealth: Payer: Self-pay | Admitting: Internal Medicine

## 2016-11-11 NOTE — Telephone Encounter (Signed)
Patient mentioned that he doesn't recall getting a letter or anything for our fall ICD Support Group meeting.   Will you please verify that he is on the distribution list? He wants to make sure he can come to our spring event.

## 2016-11-25 ENCOUNTER — Ambulatory Visit (INDEPENDENT_AMBULATORY_CARE_PROVIDER_SITE_OTHER): Payer: Self-pay | Admitting: *Deleted

## 2016-11-25 DIAGNOSIS — I428 Other cardiomyopathies: Secondary | ICD-10-CM

## 2016-11-25 NOTE — Progress Notes (Signed)
Remote ICD transmission.   

## 2016-12-04 ENCOUNTER — Encounter: Payer: Self-pay | Admitting: Cardiology

## 2016-12-17 LAB — CUP PACEART REMOTE DEVICE CHECK
Battery Remaining Longevity: 144 mo
Date Time Interrogation Session: 20181121073100
HighPow Impedance: 50 Ohm
Implantable Lead Model: 295
Implantable Lead Serial Number: 134196
Implantable Pulse Generator Implant Date: 20150601
Lead Channel Pacing Threshold Amplitude: 1.5 V
Lead Channel Pacing Threshold Pulse Width: 0.4 ms
Lead Channel Setting Pacing Amplitude: 2.5 V
Lead Channel Setting Pacing Pulse Width: 0.4 ms
MDC IDC LEAD IMPLANT DT: 20150601
MDC IDC LEAD LOCATION: 753860
MDC IDC MSMT BATTERY REMAINING PERCENTAGE: 100 %
MDC IDC MSMT LEADCHNL RV IMPEDANCE VALUE: 431 Ohm
MDC IDC PG SERIAL: 190689
MDC IDC SET LEADCHNL RV SENSING SENSITIVITY: 0.6 mV
MDC IDC STAT BRADY RV PERCENT PACED: 0 %

## 2017-01-18 ENCOUNTER — Other Ambulatory Visit: Payer: Self-pay

## 2017-01-18 ENCOUNTER — Emergency Department (HOSPITAL_COMMUNITY): Payer: Self-pay

## 2017-01-18 ENCOUNTER — Encounter (HOSPITAL_COMMUNITY): Payer: Self-pay | Admitting: Emergency Medicine

## 2017-01-18 ENCOUNTER — Emergency Department (HOSPITAL_COMMUNITY)
Admission: EM | Admit: 2017-01-18 | Discharge: 2017-01-18 | Disposition: A | Discharge status: Home / Self Care | Payer: Medicare Other | Attending: Emergency Medicine | Admitting: Emergency Medicine

## 2017-01-18 DIAGNOSIS — I110 Hypertensive heart disease with heart failure: Secondary | ICD-10-CM | POA: Insufficient documentation

## 2017-01-18 DIAGNOSIS — I10 Essential (primary) hypertension: Secondary | ICD-10-CM | POA: Diagnosis not present

## 2017-01-18 DIAGNOSIS — Z9861 Coronary angioplasty status: Secondary | ICD-10-CM | POA: Insufficient documentation

## 2017-01-18 DIAGNOSIS — I5022 Chronic systolic (congestive) heart failure: Secondary | ICD-10-CM | POA: Insufficient documentation

## 2017-01-18 DIAGNOSIS — G8929 Other chronic pain: Secondary | ICD-10-CM | POA: Insufficient documentation

## 2017-01-18 DIAGNOSIS — Z9114 Patient's other noncompliance with medication regimen: Secondary | ICD-10-CM | POA: Insufficient documentation

## 2017-01-18 DIAGNOSIS — I2510 Atherosclerotic heart disease of native coronary artery without angina pectoris: Secondary | ICD-10-CM | POA: Insufficient documentation

## 2017-01-18 DIAGNOSIS — Z95810 Presence of automatic (implantable) cardiac defibrillator: Secondary | ICD-10-CM | POA: Insufficient documentation

## 2017-01-18 DIAGNOSIS — R0789 Other chest pain: Secondary | ICD-10-CM | POA: Insufficient documentation

## 2017-01-18 LAB — BASIC METABOLIC PANEL
Anion gap: 7 (ref 5–15)
BUN: 16 mg/dL (ref 6–20)
CO2: 24 mmol/L (ref 22–32)
Calcium: 9 mg/dL (ref 8.9–10.3)
Chloride: 106 mmol/L (ref 101–111)
Creatinine, Ser: 0.92 mg/dL (ref 0.61–1.24)
GFR calc Af Amer: >60 mL/min (ref >60)
GLUCOSE: 97 mg/dL (ref 65–99)
POTASSIUM: 3.7 mmol/L (ref 3.5–5.1)
Sodium: 137 mmol/L (ref 135–145)

## 2017-01-18 LAB — CBC
HCT: 46.1 % (ref 39.0–52.0)
Hemoglobin: 15.5 g/dL (ref 13.0–17.0)
MCH: 32.6 pg (ref 26.0–34.0)
MCHC: 33.6 g/dL (ref 30.0–36.0)
MCV: 97.1 fL (ref 78.0–100.0)
Platelets: 258 10*3/uL (ref 150–400)
RBC: 4.75 MIL/uL (ref 4.22–5.81)
RDW: 12.5 % (ref 11.5–15.5)
WBC: 14.1 10*3/uL — ABNORMAL HIGH (ref 4.0–10.5)

## 2017-01-18 LAB — I-STAT TROPONIN, ED: Troponin i, poc: 0 ng/mL (ref 0.00–0.08)

## 2017-01-18 MED ORDER — TRAZODONE HCL 100 MG PO TABS: 100.0000 mg | ORAL_TABLET | Freq: Every day | ORAL | 0 refills | Status: DC

## 2017-01-18 MED ORDER — OMEPRAZOLE 20 MG PO CPDR: 20.0000 mg | DELAYED_RELEASE_CAPSULE | Freq: Every day | ORAL | 0 refills | Status: DC

## 2017-01-18 MED ORDER — BUPROPION HCL ER (XL) 300 MG PO TB24: 300.0000 mg | ORAL_TABLET | Freq: Every day | ORAL | 0 refills | Status: DC

## 2017-01-18 MED ORDER — NITROGLYCERIN 0.4 MG SL SUBL: 0.4000 mg | SUBLINGUAL_TABLET | SUBLINGUAL | 0 refills | Status: DC | PRN

## 2017-01-18 MED ORDER — CARVEDILOL 25 MG PO TABS: 25.0000 mg | ORAL_TABLET | Freq: Two times a day (BID) | ORAL | 0 refills | Status: DC

## 2017-01-18 MED ORDER — DIGOXIN 125 MCG PO TABS: 0.1250 mg | ORAL_TABLET | Freq: Every day | ORAL | 0 refills | Status: DC

## 2017-01-18 NOTE — Discharge Instructions (Signed)
Schedule an appointment to follow-up with your primary care doctor or your cardiologist as soon as possible.

## 2017-01-18 NOTE — ED Triage Notes (Signed)
Pt c/o intermittent chest pain x several weeks. Pt states pain is mid to l chest, pt with mild sob with chest pain and pt reports that pain is not related to activity. Pt with implanted defib.

## 2017-01-18 NOTE — ED Notes (Addendum)
EKG completed at 1305.  Image can be seen in "Results Review," but not on Event log.

## 2017-01-19 NOTE — ED Provider Notes (Signed)
[facility] DEPT Provider Note   CSN: [id number] Arrival date & time: 01/18/17  1241     History   Chief Complaint No chief complaint on file.   HPI Stanley [name] is a 52 y.o. male.  HPI  Patient presents with episodes of chest pain over the last few weeks.  History of chronic chest pain and has had previous coronary artery disease but also has had chronic chest pain with positive stress test and a negative cardiac CT.  Has had thought of GI cause of some of the pain in the past.  States that the same pain is come back.  He has been also on his medicines due to financial issues.  Has had pain particularly after eating and also after laying down.  Does have with exertion at times also.  Pain-free now.  Has had occasional cough.  He has an AICD.  Has had nonischemic cardiomyopathy but also a previous stent. Past Medical History:  Diagnosis Date  . Acute renal failure (ARF) ([place])   . CAD (coronary artery disease)    PCI in 2011  . CHF (congestive heart failure) ([place])   . Gout   . Gouty arthritis   . Hypertension   . Insomnia   . Nonischemic cardiomyopathy ([place])   . Ventricular tachycardia ([place]) 01/2015   ATP delivered for sinus tach, degenerated into VT for which he received shock    Patient Active Problem List   Diagnosis Date Noted  . Gout 09/25/2015  . Cervical paraspinal muscle spasm 09/25/2015  . Nonischemic cardiomyopathy ([place]) 07/03/2015  . Chronic systolic dysfunction of left ventricle 07/03/2015  . Ventricular tachyarrhythmia ([place]) 07/03/2015  . Essential hypertension 07/03/2015    Past Surgical History:  Procedure Laterality Date  . CARDIAC DEFIBRILLATOR PLACEMENT  06/05/13   [employer] Inogen ICD implanted at [facility] in [place] for primary prevention  . HERNIA REPAIR    . PERCUTANEOUS CORONARY STENT INTERVENTION (PCI-S)  2011       Home Medications    Prior to Admission medications   Medication Sig  Start Date End Date Taking? Authorizing Provider  allopurinol (ZYLOPRIM) 100 MG tablet TAKE 1 TABLET(100 MG) BY MOUTH DAILY 01/01/16  Yes Golden Circle, FNP  furosemide (LASIX) 80 MG tablet Take 1 tablet (80 mg total) by mouth daily. 08/26/16 01/18/17 Yes Baldwin Jamaica, PA-C  potassium chloride SA (K-DUR,KLOR-CON) 20 MEQ tablet Take 1 tablet by mouth daily. 06/17/15  Yes [provider]  sacubitril-valsartan (ENTRESTO) 97-103 MG Take 1 tablet by mouth 2 (two) times daily. 05/26/16  Yes Baldwin Jamaica, PA-C  buPROPion (WELLBUTRIN XL) 300 MG 24 hr tablet Take 1 tablet (300 mg total) by mouth daily. 01/18/17   Davonna Belling, MD  carvedilol (COREG) 25 MG tablet Take 1 tablet (25 mg total) by mouth 2 (two) times daily with a meal. 01/18/17   Davonna Belling, MD  digoxin (LANOXIN) 0.125 MG tablet Take 1 tablet (0.125 mg total) by mouth daily. 01/18/17   Davonna Belling, MD  nitroGLYCERIN (NITROSTAT) 0.4 MG SL tablet Place 1 tablet (0.4 mg total) under the tongue every 5 (five) minutes x 3 doses as needed for chest pain. 01/18/17   Davonna Belling, MD  omeprazole (PRILOSEC) 20 MG capsule Take 1 capsule (20 mg total) by mouth daily. 01/18/17   Davonna Belling, MD  traZODone (DESYREL) 100 MG tablet Take 1 tablet (100 mg total) by mouth at bedtime. 01/18/17   Davonna Belling, MD  Family History Family History  Problem Relation Age of Onset  . Cancer Mother        Pancreatic  . Hypertension Father   . Alzheimer's disease Father   . Gout Father   . Dementia Father   . Heart disease Maternal Grandmother   . Gout Maternal Grandfather   . Gout Paternal Grandfather     Social History Social History   Tobacco Use  . Smoking status: Never Smoker  . Smokeless tobacco: Never Used  Substance Use Topics  . Alcohol use: No    Alcohol/week: 0.0 oz  . Drug use: No     Allergies   Apple; Lisinopril; Other; and Shellfish allergy   Review of Systems Review of Systems    Constitutional: Positive for appetite change. Negative for fever.  HENT: Negative for congestion.   Respiratory: Positive for chest tightness.   Cardiovascular: Positive for chest pain and leg swelling.  Gastrointestinal: Negative for abdominal pain.  Endocrine: Negative for polyuria.  Genitourinary: Negative for flank pain.  Musculoskeletal: Negative for back pain.  Skin: Negative for rash.  Neurological: Negative for speech difficulty.  Hematological: Negative for adenopathy.  Psychiatric/Behavioral: Negative for confusion.     Physical Exam Updated Vital Signs BP (!) 159/100 (BP Location: Right Arm)   Pulse 72   Temp 99 F (37.2 C) (Oral)   Resp 17   SpO2 99%   Physical Exam  Constitutional: He appears well-developed.  HENT:  Head: Normocephalic.  Eyes: EOM are normal.  Neck: Neck supple.  Cardiovascular: Normal rate.  Pulmonary/Chest: Effort normal.  Abdominal: There is no tenderness.  Musculoskeletal: He exhibits edema.  Mild edema bilateral lower legs.  Neurological: He is alert.  Skin: Skin is warm. Capillary refill takes less than 2 seconds.     ED Treatments / Results  Labs (all labs ordered are listed, but only abnormal results are displayed) Labs Reviewed  CBC - Abnormal; Notable for the following components:      Result Value   WBC 14.1 (*)    All other components within normal limits  BASIC METABOLIC PANEL  I-STAT TROPONIN, ED    EKG  EKG Interpretation  Date/Time:  Monday January 18 2017 16:14:59 EST Ventricular Rate:  96 PR Interval:    QRS Duration: 108 QT Interval:  396 QTC Calculation: 501 R Axis:   39 Text Interpretation:  Sinus rhythm Biatrial enlargement Incomplete left bundle branch block Left ventricular hypertrophy Prolonged QT interval Confirmed by Davonna Belling [phone]) on 01/18/2017 4:26:43 PM       Radiology Dg Chest 2 View  Result Date: 01/18/2017 CLINICAL DATA:  Chest pain, left arm pain EXAM: CHEST  2 VIEW  COMPARISON:  None. FINDINGS: There is no focal parenchymal opacity. There is no pleural effusion or pneumothorax. There is stable cardiomegaly. There is a single lead cardiac pacemaker. The osseous structures are unremarkable. IMPRESSION: No active cardiopulmonary disease. Electronically Signed   By: Kathreen Devoid   On: 01/18/2017 14:32    Procedures Procedures (including critical care time)  Medications Ordered in ED Medications - No data to display   Initial Impression / Assessment and Plan / ED Course  I have reviewed the triage vital signs and the nursing notes.  Pertinent labs & imaging results that were available during my care of the patient were reviewed by me and considered in my medical decision making (see chart for details).  Clinical Course as of Jan 20 39  Mon Jan 18, 2017  1324  ED EKG within 10 minutes [LS]    Clinical Course User Index [LS] Fransico Meadow, PA-C    Patient with chest pain.  Is hypertensive here.  Has been noncompliant with some of his medicines.  Medications of refilled.  Discussed with Dr. Marlou Porch from cardiology.  Nonspecific EKG changes that are new since most previous EKG but have been present in the past.  Likely some GI component to the chest pain.  Reassured due to recent cardiac CT.  Refilled patient medications and he will follow-up with cardiology.  Doubt ACS at this time.  Final Clinical Impressions(s) / ED Diagnoses   Final diagnoses:  Hypertension, unspecified type  Noncompliance with medications    ED Discharge Orders        Ordered    buPROPion (WELLBUTRIN XL) 300 MG 24 hr tablet  Daily     01/18/17 1724    digoxin (LANOXIN) 0.125 MG tablet  Daily     01/18/17 1724    carvedilol (COREG) 25 MG tablet  2 times daily with meals     01/18/17 1724    traZODone (DESYREL) 100 MG tablet  Daily at bedtime     01/18/17 1724    nitroGLYCERIN (NITROSTAT) 0.4 MG SL tablet  Every 5 min x3 PRN     01/18/17 1724    omeprazole (PRILOSEC) 20  MG capsule  Daily     01/18/17 1724       Davonna Belling, MD 01/19/17 0041

## 2017-02-24 ENCOUNTER — Ambulatory Visit (INDEPENDENT_AMBULATORY_CARE_PROVIDER_SITE_OTHER): Payer: Self-pay | Admitting: *Deleted

## 2017-02-24 DIAGNOSIS — I428 Other cardiomyopathies: Secondary | ICD-10-CM

## 2017-02-25 ENCOUNTER — Encounter: Payer: Self-pay | Admitting: Cardiology

## 2017-02-25 NOTE — Progress Notes (Signed)
Remote ICD transmission.   

## 2017-03-03 LAB — CUP PACEART REMOTE DEVICE CHECK
Battery Remaining Percentage: 100 %
Date Time Interrogation Session: 20190220180800
HighPow Impedance: 57 Ohm
Implantable Lead Implant Date: 20150601
Implantable Lead Serial Number: 134196
Implantable Pulse Generator Implant Date: 20150601
Lead Channel Impedance Value: 423 Ohm
Lead Channel Pacing Threshold Amplitude: 1.5 V
Lead Channel Pacing Threshold Pulse Width: 0.4 ms
Lead Channel Setting Pacing Amplitude: 2.5 V
Lead Channel Setting Sensing Sensitivity: 0.6 mV
MDC IDC LEAD LOCATION: 753860
MDC IDC MSMT BATTERY REMAINING LONGEVITY: 144 mo
MDC IDC SET LEADCHNL RV PACING PULSEWIDTH: 0.4 ms
MDC IDC STAT BRADY RV PERCENT PACED: 0 %
Pulse Gen Serial Number: 190689

## 2017-04-05 ENCOUNTER — Encounter: Payer: Self-pay | Admitting: Physician Assistant

## 2017-04-12 ENCOUNTER — Encounter: Payer: Self-pay | Admitting: Nurse Practitioner

## 2017-04-12 ENCOUNTER — Ambulatory Visit: Payer: Self-pay | Attending: Nurse Practitioner | Admitting: Licensed Clinical Social Worker

## 2017-04-12 ENCOUNTER — Ambulatory Visit: Payer: Self-pay | Attending: Nurse Practitioner | Admitting: Nurse Practitioner

## 2017-04-12 VITALS — BP 152/98 | HR 85 | Temp 98.6°F | Resp 12 | Ht 65.0 in | Wt 186.8 lb

## 2017-04-12 DIAGNOSIS — K210 Gastro-esophageal reflux disease with esophagitis: Secondary | ICD-10-CM | POA: Insufficient documentation

## 2017-04-12 DIAGNOSIS — Z955 Presence of coronary angioplasty implant and graft: Secondary | ICD-10-CM | POA: Insufficient documentation

## 2017-04-12 DIAGNOSIS — Z888 Allergy status to other drugs, medicaments and biological substances status: Secondary | ICD-10-CM | POA: Insufficient documentation

## 2017-04-12 DIAGNOSIS — G4700 Insomnia, unspecified: Secondary | ICD-10-CM | POA: Insufficient documentation

## 2017-04-12 DIAGNOSIS — M1A09X0 Idiopathic chronic gout, multiple sites, without tophus (tophi): Secondary | ICD-10-CM

## 2017-04-12 DIAGNOSIS — I472 Ventricular tachycardia, unspecified: Secondary | ICD-10-CM

## 2017-04-12 DIAGNOSIS — I10 Essential (primary) hypertension: Secondary | ICD-10-CM

## 2017-04-12 DIAGNOSIS — F5104 Psychophysiologic insomnia: Secondary | ICD-10-CM

## 2017-04-12 DIAGNOSIS — Z95810 Presence of automatic (implantable) cardiac defibrillator: Secondary | ICD-10-CM | POA: Insufficient documentation

## 2017-04-12 DIAGNOSIS — F331 Major depressive disorder, recurrent, moderate: Secondary | ICD-10-CM

## 2017-04-12 DIAGNOSIS — I428 Other cardiomyopathies: Secondary | ICD-10-CM | POA: Insufficient documentation

## 2017-04-12 DIAGNOSIS — R0602 Shortness of breath: Secondary | ICD-10-CM | POA: Insufficient documentation

## 2017-04-12 DIAGNOSIS — K219 Gastro-esophageal reflux disease without esophagitis: Secondary | ICD-10-CM

## 2017-04-12 DIAGNOSIS — I110 Hypertensive heart disease with heart failure: Secondary | ICD-10-CM | POA: Insufficient documentation

## 2017-04-12 DIAGNOSIS — Z79899 Other long term (current) drug therapy: Secondary | ICD-10-CM | POA: Insufficient documentation

## 2017-04-12 DIAGNOSIS — I2510 Atherosclerotic heart disease of native coronary artery without angina pectoris: Secondary | ICD-10-CM | POA: Insufficient documentation

## 2017-04-12 DIAGNOSIS — Z760 Encounter for issue of repeat prescription: Secondary | ICD-10-CM | POA: Insufficient documentation

## 2017-04-12 DIAGNOSIS — I509 Heart failure, unspecified: Secondary | ICD-10-CM | POA: Insufficient documentation

## 2017-04-12 DIAGNOSIS — M1A9XX0 Chronic gout, unspecified, without tophus (tophi): Secondary | ICD-10-CM | POA: Insufficient documentation

## 2017-04-12 MED ORDER — OMEPRAZOLE 20 MG PO CPDR: 20.0000 mg | DELAYED_RELEASE_CAPSULE | Freq: Every day | ORAL | 0 refills | Status: DC

## 2017-04-12 MED ORDER — ALLOPURINOL 100 MG PO TABS: 100.0000 mg | ORAL_TABLET | Freq: Every day | ORAL | 1 refills | Status: DC

## 2017-04-12 MED ORDER — LOSARTAN POTASSIUM 50 MG PO TABS: 50.0000 mg | ORAL_TABLET | Freq: Every day | ORAL | 3 refills | Status: DC

## 2017-04-12 MED ORDER — DIGOXIN 125 MCG PO TABS: 0.1250 mg | ORAL_TABLET | Freq: Every day | ORAL | 0 refills | Status: DC

## 2017-04-12 MED ORDER — TRAZODONE HCL 100 MG PO TABS: 100.0000 mg | ORAL_TABLET | Freq: Every day | ORAL | 1 refills | Status: DC

## 2017-04-12 MED ORDER — FUROSEMIDE 80 MG PO TABS: 80.0000 mg | ORAL_TABLET | Freq: Every day | ORAL | 3 refills | Status: DC

## 2017-04-12 MED ORDER — TRAZODONE HCL 150 MG PO TABS: 150.0000 mg | ORAL_TABLET | Freq: Every day | ORAL | 0 refills | Status: DC

## 2017-04-12 NOTE — BH Specialist Note (Signed)
Integrated Behavioral Health Initial Visit  MRN: [id number] Name: Stanley [name]  Number of [place] Clinician visits:: 1/6 Session Start time: 3:50 PM  Session End time: 4:20 PM Total time: 30 minutes  Type of Service: [place] Interpretor:No. Interpretor Name and Language: N.A   Warm Hand Off Completed.       SUBJECTIVE: Stanley [name] is a 52 y.o. male accompanied by self Patient was referred by NP Raul Del for depression and anxiety. Patient reports the following symptoms/concerns: overwhelming feelings of sadness and worry, difficulty sleeping, low energy, decreased concentration, irritability, and hx of suicidal ideations Duration of problem: Ongoing; Severity of problem: moderate  OBJECTIVE: Mood: Depressed and Affect: Appropriate Risk of harm to self or others: No plan to harm self or others Pt has hx of suicidal ideations; however, currently denies current SI/HI/AVH.   LIFE CONTEXT: Family and Social: Pt receives support from family (adult son, adult daughter, and five grandchildren) He resides alone School/Work: Pt receives monthly income Self-Care: Pt has a hx of substance use (alcohol) Denies current use or substance use resources Life Changes: Pt has ongoing chronic medical conditions. He recently relocated to [facility] from [place] to be closer to family. Pt is grieving the loss of his brother  GOALS ADDRESSED: Patient will: 1. Reduce symptoms of: anxiety and depression 2. Increase knowledge and/or ability of: coping skills  3. Demonstrate ability to: Increase adequate support systems for patient/family  INTERVENTIONS: Interventions utilized: Solution-Focused Strategies, Supportive Counseling, Psychoeducation and/or Health Education and Link to [employer]  Standardized Assessments completed: GAD-7 and PHQ 2&9 with C-SSRS  ASSESSMENT: Patient currently experiencing depression and anxiety triggered by  ongoing chronic medical conditions, financial strain, and the loss of a loved one. He reports overwhelming feelings of sadness and worry, difficulty sleeping, low energy, decreased concentration, irritability, and hx of substance use (alcohol) and suicidal ideations. Currently, pt denies SI/HI/AVH/SU. He receives strong support from family who resides nearby.   Patient may benefit from psychoeducation and psychotherapy. [place] educated pt on correlation between one's physical and mental health, in addition, to how stress can negatively impact health. Therapeutic interventions were discussed and pt successfully identified healthy strategies to decrease symptoms. Pt is participating in medication management. Community resources for psychotherapy and crisis intervention were provided. Pt was strongly encouraged to schedule appointment with financial counseling to assist with orange card and hospital bills.   PLAN: 1. Follow up with behavioral health clinician on : Pt was encouraged to contact LCSWA if symptoms worsen or fail to improve to schedule behavioral appointments at [facility]. 2. Behavioral recommendations: LCSWA recommends that pt apply healthy coping skills discussed, comply with medication management, utilize resources, and schedule appointment with financial counseling. Pt is encouraged to schedule follow up appointment with LCSWA 3. Referral(s): Armed forces logistics/support/administrative officer (LME/Outside Clinic) and Community Resources:  Finances 4. "From scale of 1-10, how likely are you to follow plan?": 8/10  Rebekah [place]field, LCSW 04/15/17 10:08 AM

## 2017-04-12 NOTE — Progress Notes (Signed)
Assessment & Plan:  Stanley [name] was seen today for establish care, shortness of breath and medication refill.  Diagnoses and all orders for this visit:  Essential hypertension -     CMP14+EGFR -     losartan (COZAAR) 50 MG tablet; Take 1 tablet (50 mg total) by mouth daily. -     Lipid panel Continue all antihypertensives as prescribed.  Remember to bring in your blood pressure log with you for your follow up appointment.  DASH/Mediterranean Diets are healthier choices for HTN.   Chronic gout of multiple sites, unspecified cause -     Uric Acid -     allopurinol (ZYLOPRIM) 100 MG tablet; Take 1 tablet (100 mg total) by mouth daily.  Nonischemic cardiomyopathy (HCC) -     Brain natriuretic peptide -     furosemide (LASIX) 80 MG tablet; Take 1 tablet (80 mg total) by mouth daily. Chest xray; PENDING  Ventricular tachyarrhythmia (HCC) -     digoxin (LANOXIN) 0.125 MG tablet; Take 1 tablet (0.125 mg total) by mouth daily.  Gastroesophageal reflux disease, esophagitis presence not specified -     omeprazole (PRILOSEC) 20 MG capsule; Take 1 capsule (20 mg total) by mouth daily. INSTRUCTIONS: Avoid GERD Triggers: acidic, spicy or fried foods, caffeine, coffee, sodas,  alcohol and chocolate.   Psychophysiological insomnia -     traZODone (DESYREL) 150 MG tablet; Take 1 tablet (150 mg total) by mouth at bedtime.   Patient has been counseled on age-appropriate routine health concerns for screening and prevention. These are reviewed and up-to-date. Referrals have been placed accordingly. Immunizations are up-to-date or declined.    Subjective:   Chief Complaint  Patient presents with  . Establish Care    Pt. is here to establish care.  . Shortness of Breath    Pt. stated he have shortness of breath when he constantly moving. Pt. stated he sometimes have chest pain.   . Medication Refill   HPI Stanley [name] 52 y.o. male presents to office today to establish care. He has a  significant cardiac history. His last follow up with cardiology was "around November 2018". He states he could not afford to keep paying out of pocket so he stopped going. He will need to speak with the financial counselor for assistance prior to being referred back to Dr. Vivia Birmingham.  He has a history of CAD with PCI in 2006 (non ischemic cardiomyopathy due to hypertension/ETOH abuse), and ventricular tachycardia with ICD placement.  CHF He endorses shortness of breath. He only takes '40mg'$  of his lasix but is unable to explain why he is not taking the prescribed '80mg'$  daily dose. He was instructed to resume the '80mg'$  as he has a history of CHF.  but he has a '40mg'$  bottle of furosemide.   Chronic Gout He ran out of allopurinol. Will obtain uric acid level today as baseline and restart him on '100mg'$  daily of allopurinol. Will recheck in 4-6 weeks.  Lab Results  Component Value Date   LABURIC 9.0 (H) 04/12/2017   Essential Hypertension Chronic and poorly controlled.  He is not medication or diet compliant.  Reports his brother died recently and he has not been taking care of himself.  "I had just about given up".  He ran out of his medications several weeks ago. Denies  palpitations, lightheadedness, dizziness, headaches or BLE edema.  Endorses intermittent episodes of chest pain and shortness of breath.  He is not taking nitroglycerin for his chest pain. BP  Readings from Last 3 Encounters:  04/12/17 (!) 152/98  01/18/17 (!) 159/100  08/26/16 (!) 152/78   Insomnia` Not well controlled with trazodone 100 mg nightly.  We will increase to 150 mg nightly at this time and follow-up at next office visit.   Review of Systems  Constitutional: Positive for malaise/fatigue. Negative for fever and weight loss.  HENT: Negative.  Negative for nosebleeds.   Eyes: Negative.  Negative for blurred vision, double vision and photophobia.  Respiratory: Positive for shortness of breath. Negative for cough.     Cardiovascular: Positive for chest pain (Intermittent episodes; currently denies). Negative for palpitations and leg swelling.  Gastrointestinal: Negative.  Negative for heartburn, nausea and vomiting.  Musculoskeletal: Positive for joint pain. Negative for myalgias.  Neurological: Negative.  Negative for dizziness, focal weakness, seizures and headaches.  Psychiatric/Behavioral: Positive for depression and substance abuse (History of EtOH abuse). Negative for suicidal ideas. The patient has insomnia.     Past Medical History:  Diagnosis Date  . Acute renal failure (ARF) ([place])   . CAD (coronary artery disease)    PCI in 2011  . CHF (congestive heart failure) ([place])   . Gout   . Gouty arthritis   . Hypertension   . Insomnia   . Nonischemic cardiomyopathy ([place])   . Ventricular tachycardia ([place]) 01/2015   ATP delivered for sinus tach, degenerated into VT for which he received shock    Past Surgical History:  Procedure Laterality Date  . CARDIAC DEFIBRILLATOR PLACEMENT  06/05/13   [employer] Inogen ICD implanted at [facility] in [place] for primary prevention  . HERNIA REPAIR    . PERCUTANEOUS CORONARY STENT INTERVENTION (PCI-S)  2011    Family History  Problem Relation Age of Onset  . Cancer Mother        Pancreatic  . Hypertension Father   . Alzheimer's disease Father   . Gout Father   . Dementia Father   . Hypertension Sister   . Clotting disorder Sister   . Obesity Brother   . Bronchitis Brother   . Heart disease Maternal Grandmother   . Gout Paternal Grandfather     Social History Reviewed with no changes to be made today.   Outpatient Medications Prior to Visit  Medication Sig Dispense Refill  . buPROPion (WELLBUTRIN XL) 300 MG 24 hr tablet Take 1 tablet (300 mg total) by mouth daily. 30 tablet 0  . carvedilol (COREG) 25 MG tablet Take 1 tablet (25 mg total) by mouth 2 (two) times daily with a meal. 60 tablet 0  . nitroGLYCERIN (NITROSTAT) 0.4 MG  SL tablet Place 1 tablet (0.4 mg total) under the tongue every 5 (five) minutes x 3 doses as needed for chest pain. 30 tablet 0  . digoxin (LANOXIN) 0.125 MG tablet Take 1 tablet (0.125 mg total) by mouth daily. 30 tablet 0  . omeprazole (PRILOSEC) 20 MG capsule Take 1 capsule (20 mg total) by mouth daily. 14 capsule 0  . sacubitril-valsartan (ENTRESTO) 97-103 MG Take 1 tablet by mouth 2 (two) times daily. 60 tablet 5  . traZODone (DESYREL) 100 MG tablet Take 1 tablet (100 mg total) by mouth at bedtime. 30 tablet 0  . potassium chloride SA (K-DUR,KLOR-CON) 20 MEQ tablet Take 1 tablet by mouth daily.  9  . allopurinol (ZYLOPRIM) 100 MG tablet TAKE 1 TABLET(100 MG) BY MOUTH DAILY (Patient not taking: Reported on 04/12/2017) 30 tablet 1  . furosemide (LASIX) 80 MG tablet Take 1  tablet (80 mg total) by mouth daily. 90 tablet 3   No facility-administered medications prior to visit.     Allergies  Allergen Reactions  . Apple     unknown  . Lisinopril     UNKNOWN  . Other     Nuts - unknown reaction  . Shellfish Allergy     UNKNOWN       Objective:    BP (!) 152/98 (BP Location: Left Arm, Patient Position: Sitting, Cuff Size: Normal)   Pulse 85   Temp 98.6 F (37 C) (Oral)   Ht '5\' 5"'$  (1.651 m)   Wt 186 lb 12.8 oz (84.7 kg)   SpO2 96%   BMI 31.09 kg/m  Wt Readings from Last 3 Encounters:  04/12/17 186 lb 12.8 oz (84.7 kg)  08/26/16 181 lb 6.4 oz (82.3 kg)  06/03/16 168 lb (76.2 kg)    Physical Exam  Constitutional: He is oriented to person, place, and time. He appears well-developed and well-nourished. He is cooperative.  HENT:  Head: Normocephalic and atraumatic.  Eyes: EOM are normal.  Neck: Normal range of motion.  Cardiovascular: Normal rate, regular rhythm and normal heart sounds. Exam reveals no gallop and no friction rub.  No murmur heard. Pulmonary/Chest: Effort normal and breath sounds normal. No accessory muscle usage. No tachypnea. No respiratory distress. He has no  decreased breath sounds. He has no wheezes. He has no rhonchi. He has no rales. He exhibits no tenderness.  Abdominal: Soft. Bowel sounds are normal.  Musculoskeletal: Normal range of motion. He exhibits no edema.  Neurological: He is alert and oriented to person, place, and time. Coordination normal.  Skin: Skin is warm and dry.  Psychiatric: He has a normal mood and affect. His behavior is normal. Judgment and thought content normal.  Nursing note and vitals reviewed.        Patient has been counseled extensively about nutrition and exercise as well as the importance of adherence with medications and regular follow-up. The patient was given clear instructions to go to ER or return to medical center if symptoms don't improve, worsen or new problems develop. The patient verbalized understanding.   Follow-up: Return in about 1 month (around 05/10/2017) for BP Recheck.   Gildardo Pounds, FNP-BC [facility] and [place], [place]   04/13/2017, 8:30 PM

## 2017-04-13 ENCOUNTER — Encounter: Payer: Self-pay | Admitting: Nurse Practitioner

## 2017-04-13 LAB — CMP14+EGFR
A/G RATIO: 1.3 (ref 1.2–2.2)
ALK PHOS: 74 IU/L (ref 39–117)
ALT: 26 IU/L (ref 0–44)
AST: 25 IU/L (ref 0–40)
Albumin: 4.3 g/dL (ref 3.5–5.5)
BUN/Creatinine Ratio: 11 (ref 9–20)
BUN: 12 mg/dL (ref 6–24)
Bilirubin Total: 0.6 mg/dL (ref 0.0–1.2)
CO2: 25 mmol/L (ref 20–29)
Calcium: 9.3 mg/dL (ref 8.7–10.2)
Chloride: 102 mmol/L (ref 96–106)
Creatinine, Ser: 1.09 mg/dL (ref 0.76–1.27)
GFR calc Af Amer: 89 mL/min/{1.73_m2} (ref >59)
GFR calc non Af Amer: 77 mL/min/{1.73_m2} (ref >59)
GLOBULIN, TOTAL: 3.4 g/dL (ref 1.5–4.5)
Glucose: 84 mg/dL (ref 65–99)
Potassium: 3.6 mmol/L (ref 3.5–5.2)
SODIUM: 144 mmol/L (ref 134–144)
Total Protein: 7.7 g/dL (ref 6.0–8.5)

## 2017-04-13 LAB — BRAIN NATRIURETIC PEPTIDE: BNP: 170.7 pg/mL — ABNORMAL HIGH (ref 0.0–100.0)

## 2017-04-13 LAB — LIPID PANEL
CHOLESTEROL TOTAL: 257 mg/dL — AB (ref 100–199)
Chol/HDL Ratio: 5.4 ratio — ABNORMAL HIGH (ref 0.0–5.0)
HDL: 48 mg/dL (ref >39)
LDL Calculated: 172 mg/dL — ABNORMAL HIGH (ref 0–99)
TRIGLYCERIDES: 184 mg/dL — AB (ref 0–149)
VLDL Cholesterol Cal: 37 mg/dL (ref 5–40)

## 2017-04-13 LAB — URIC ACID: URIC ACID: 9 mg/dL — AB (ref 3.7–8.6)

## 2017-04-13 MED ORDER — ATORVASTATIN CALCIUM 40 MG PO TABS: 40.0000 mg | ORAL_TABLET | Freq: Every day | ORAL | 3 refills | Status: DC

## 2017-04-16 ENCOUNTER — Telehealth: Payer: Self-pay

## 2017-04-16 NOTE — Telephone Encounter (Signed)
-----   Message from Gildardo Pounds, NP sent at 04/13/2017  8:49 PM EDT ----- Level is elevated.  Continue on same dose of allopurinol at this time.  Will recheck in 4-6 weeks.  Your cholesterol levels are moderately elevated.  Will send a prescription for Lipitor to [place].  Electrolytes, kidney function, and liver function are all normal. Your BNP which is an indicator of heart failure is slightly elevated. Make sure you are taking your lasix 80mg  as prescribed. Will recheck in a few weeks. Please make sure you speak with the financial counselor so you may be referred back to cardiology. Also I have ordered a chest xray to be performed at [facility]. You do not need an appointment.

## 2017-04-16 NOTE — Telephone Encounter (Signed)
CMA attempt to call patient to inform on lab results. No answer and left a VM for patient to call back.   If patient call back, please inform:  Level is elevated.  Continue on same dose of allopurinol at this time.  Will recheck in 4-6 weeks.  Your cholesterol levels are moderately elevated.  Will send a prescription for Lipitor to [place].  Electrolytes, kidney function, and liver function are all normal. Your BNP which is an indicator of heart failure is slightly elevated. Make sure you are taking your lasix 80mg  as prescribed. Will recheck in a few weeks. Please make sure you speak with the financial counselor so you may be referred back to cardiology. Also I have ordered a chest xray to be performed at [facility]. You do not need an appointment.

## 2017-05-03 ENCOUNTER — Ambulatory Visit: Payer: Self-pay | Attending: Nurse Practitioner

## 2017-05-12 ENCOUNTER — Other Ambulatory Visit (INDEPENDENT_AMBULATORY_CARE_PROVIDER_SITE_OTHER): Payer: Self-pay | Admitting: Nurse Practitioner

## 2017-05-12 ENCOUNTER — Ambulatory Visit: Payer: Self-pay | Admitting: Nurse Practitioner

## 2017-05-12 ENCOUNTER — Ambulatory Visit (INDEPENDENT_AMBULATORY_CARE_PROVIDER_SITE_OTHER): Payer: Self-pay | Admitting: Nurse Practitioner

## 2017-05-12 ENCOUNTER — Encounter (INDEPENDENT_AMBULATORY_CARE_PROVIDER_SITE_OTHER): Payer: Self-pay | Admitting: Nurse Practitioner

## 2017-05-12 VITALS — BP 126/73 | HR 89 | Temp 98.3°F | Resp 18 | Ht 64.0 in | Wt 183.0 lb

## 2017-05-12 DIAGNOSIS — I509 Heart failure, unspecified: Secondary | ICD-10-CM | POA: Diagnosis not present

## 2017-05-12 DIAGNOSIS — M1A9XX0 Chronic gout, unspecified, without tophus (tophi): Secondary | ICD-10-CM | POA: Diagnosis not present

## 2017-05-12 DIAGNOSIS — F419 Anxiety disorder, unspecified: Secondary | ICD-10-CM

## 2017-05-12 DIAGNOSIS — F329 Major depressive disorder, single episode, unspecified: Secondary | ICD-10-CM

## 2017-05-12 DIAGNOSIS — I10 Essential (primary) hypertension: Secondary | ICD-10-CM

## 2017-05-12 MED ORDER — ESCITALOPRAM OXALATE 20 MG PO TABS: 20.0000 mg | ORAL_TABLET | Freq: Every day | ORAL | 1 refills | Status: DC

## 2017-05-12 MED ORDER — BUSPIRONE HCL 10 MG PO TABS: 10.0000 mg | ORAL_TABLET | Freq: Three times a day (TID) | ORAL | 1 refills | Status: DC

## 2017-05-12 NOTE — Progress Notes (Signed)
Assessment & Plan:  Stanley [name] was seen today for follow-up.  Diagnoses and all orders for this visit:  Essential hypertension Continue all antihypertensives as prescribed.  Remember to bring in your blood pressure log with you for your follow up appointment.  DASH/Mediterranean Diets are healthier choices for HTN.    Chronic congestive heart failure, unspecified heart failure type (HCC) -     Brain natriuretic peptide  Chronic gout without tophus, unspecified cause, unspecified site -     Uric Acid  Anxiety and depression -     busPIRone (BUSPAR) 10 MG tablet; Take 1 tablet (10 mg total) by mouth 3 (three) times daily. -     escitalopram (LEXAPRO) 20 MG tablet; Take 1 tablet (20 mg total) by mouth daily.    Patient has been counseled on age-appropriate routine health concerns for screening and prevention. These are reviewed and up-to-date. Referrals have been placed accordingly. Immunizations are up-to-date or declined.    Subjective:   Chief Complaint  Patient presents with  . Follow-up   HPI Stanley [name] 52 y.o. male presents to office today for BP recheck.   Essential Hypertension His blood pressure has been poorly controlled in the past. I added losartan 50mg  at his last office visit on 04-12-2017. Blood pressure today.  He endorses medication compliance taking losartan 50 mg daily. Denies  shortness of breath, palpitations, lightheadedness, dizziness, headaches or BLE edema.  He endorses episodes of chest pain that resolved after seconds.  He has used his nitroglycerin tablets sparingly "a few times" in the past. He denies any shocks from his defibrillator. He is not diet compliant and continues to eat unhealthy foods.  BP Readings from Last 3 Encounters:  05/12/17 126/73  04/12/17 (!) 152/98  01/18/17 (!) 159/100   Depression and Anxiety Reports his best friend died this morning. He has a history of depression and had been on Wellbutrin but stopped taking it  and reports it made his side ache. He does endorse depression and anxiety which is mostly related to his cardiac history and stressing that he may experience another cardiac event. He denies any current suicidal ideation. Would like to "try something" else for his symptoms. Will start lexapro and buspar and follow up in a few weeks.  Depression screen PHQ 2/9 05/12/2017  Decreased Interest 3  Down, Depressed, Hopeless 3  PHQ - 2 Score 6  Altered sleeping 2  Tired, decreased energy 3  Change in appetite 2  Feeling bad or failure about yourself  3  Trouble concentrating 3  Moving slowly or fidgety/restless 3  Suicidal thoughts 2  PHQ-9 Score 24    CHF Taking lasix 80mg  daily as prescribed. Denies BLE edema,dyspnea, PND, cough or shortness of breath. Endorses increased abdominal girth and intermittent chest pain. He has an office appointment scheduled with cardiology on 05-26-2017. He has been instructed to make sure he does not miss his appointment.   Review of Systems  Constitutional: Negative for fever, malaise/fatigue and weight loss.  HENT: Negative.  Negative for nosebleeds.   Eyes: Negative.  Negative for blurred vision, double vision and photophobia.  Respiratory: Negative.  Negative for cough and shortness of breath.   Cardiovascular: Positive for chest pain. Negative for palpitations and leg swelling.  Gastrointestinal: Negative.  Negative for heartburn, nausea and vomiting.  Musculoskeletal: Negative.  Negative for myalgias.  Neurological: Negative.  Negative for dizziness, focal weakness, seizures and headaches.  Psychiatric/Behavioral: Positive for depression. Negative for hallucinations, memory loss, substance abuse (  history of ETOH abuse) and suicidal ideas. The patient is nervous/anxious and has insomnia.     Past Medical History:  Diagnosis Date  . Acute renal failure (ARF) ([place])   . CAD (coronary artery disease)    PCI in 2011  . CHF (congestive heart failure) ([place])    . Gout   . Gouty arthritis   . Hypertension   . Insomnia   . Nonischemic cardiomyopathy ([place])   . Ventricular tachycardia ([place]) 01/2015   ATP delivered for sinus tach, degenerated into VT for which he received shock    Past Surgical History:  Procedure Laterality Date  . CARDIAC DEFIBRILLATOR PLACEMENT  06/05/13   [employer] Inogen ICD implanted at [facility] in [place] for primary prevention  . HERNIA REPAIR    . PERCUTANEOUS CORONARY STENT INTERVENTION (PCI-S)  2011    Family History  Problem Relation Age of Onset  . Cancer Mother        Pancreatic  . Hypertension Father   . Alzheimer's disease Father   . Gout Father   . Dementia Father   . Hypertension Sister   . Clotting disorder Sister   . Obesity Brother   . Bronchitis Brother   . Heart disease Maternal Grandmother   . Gout Paternal Grandfather     Social History Reviewed with no changes to be made today.   Outpatient Medications Prior to Visit  Medication Sig Dispense Refill  . allopurinol (ZYLOPRIM) 100 MG tablet Take 1 tablet (100 mg total) by mouth daily. 90 tablet 1  . atorvastatin (LIPITOR) 40 MG tablet Take 1 tablet (40 mg total) by mouth daily. 90 tablet 3  . carvedilol (COREG) 25 MG tablet Take 1 tablet (25 mg total) by mouth 2 (two) times daily with a meal. 60 tablet 0  . digoxin (LANOXIN) 0.125 MG tablet Take 1 tablet (0.125 mg total) by mouth daily. 30 tablet 0  . furosemide (LASIX) 80 MG tablet Take 1 tablet (80 mg total) by mouth daily. 90 tablet 3  . losartan (COZAAR) 50 MG tablet Take 1 tablet (50 mg total) by mouth daily. 90 tablet 3  . nitroGLYCERIN (NITROSTAT) 0.4 MG SL tablet Place 1 tablet (0.4 mg total) under the tongue every 5 (five) minutes x 3 doses as needed for chest pain. 30 tablet 0  . omeprazole (PRILOSEC) 20 MG capsule Take 1 capsule (20 mg total) by mouth daily. 14 capsule 0  . potassium chloride SA (K-DUR,KLOR-CON) 20 MEQ tablet Take 1 tablet by mouth daily.  9  .  traZODone (DESYREL) 150 MG tablet Take 1 tablet (150 mg total) by mouth at bedtime. 90 tablet 0  . buPROPion (WELLBUTRIN XL) 300 MG 24 hr tablet Take 1 tablet (300 mg total) by mouth daily. 30 tablet 0   No facility-administered medications prior to visit.     Allergies  Allergen Reactions  . Apple     unknown  . Lisinopril     UNKNOWN  . Other     Nuts - unknown reaction  . Shellfish Allergy     UNKNOWN       Objective:    BP 126/73 (BP Location: Left Arm, Patient Position: Sitting, Cuff Size: Normal)   Pulse 89   Temp 98.3 F (36.8 C) (Oral)   Resp 18   Ht 5\' 4"  (1.626 m)   Wt 183 lb (83 kg)   SpO2 96%   BMI 31.41 kg/m  Wt Readings from Last 3 Encounters:  05/12/17 183 lb (83 kg)  04/12/17 186 lb 12.8 oz (84.7 kg)  08/26/16 181 lb 6.4 oz (82.3 kg)    Physical Exam  Constitutional: He is oriented to person, place, and time. He appears well-developed and well-nourished. He is cooperative.  HENT:  Head: Normocephalic and atraumatic.  Eyes: EOM are normal.  Neck: Normal range of motion.  Cardiovascular: Normal rate, regular rhythm and normal heart sounds. Exam reveals no gallop and no friction rub.  No murmur heard. Pulmonary/Chest: Effort normal and breath sounds normal. No stridor. No tachypnea. No respiratory distress. He has no decreased breath sounds. He has no wheezes. He has no rhonchi. He has no rales. He exhibits no tenderness.  Abdominal: Bowel sounds are normal.  Musculoskeletal: Normal range of motion. He exhibits no edema.  Neurological: He is alert and oriented to person, place, and time. Coordination normal.  Skin: Skin is warm and dry.  Psychiatric: He has a normal mood and affect. His behavior is normal. Judgment and thought content normal.  Nursing note and vitals reviewed.      Patient has been counseled extensively about nutrition and exercise as well as the importance of adherence with medications and regular follow-up. The patient was given  clear instructions to go to ER or return to medical center if symptoms don't improve, worsen or new problems develop. The patient verbalized understanding.   Follow-up: Return in about 6 weeks (around 06/23/2017).   Gildardo Pounds, FNP-BC [facility] and [facility] [place], [place]   05/12/2017, 3:37 PM

## 2017-05-12 NOTE — Patient Instructions (Addendum)
Hypertension Hypertension is another name for high blood pressure. High blood pressure forces your heart to work harder to pump blood. This can cause problems over time. There are two numbers in a blood pressure reading. There is a top number (systolic) over a bottom number (diastolic). It is best to have a blood pressure below 120/80. Healthy choices can help lower your blood pressure. You may need medicine to help lower your blood pressure if:  Your blood pressure cannot be lowered with healthy choices.  Your blood pressure is higher than 130/80.  Follow these instructions at home: Eating and drinking  If directed, follow the DASH eating plan. This diet includes: ? Filling half of your plate at each meal with fruits and vegetables. ? Filling one quarter of your plate at each meal with whole grains. Whole grains include whole wheat pasta, brown rice, and whole grain bread. ? Eating or drinking low-fat dairy products, such as skim milk or low-fat yogurt. ? Filling one quarter of your plate at each meal with low-fat (lean) proteins. Low-fat proteins include fish, skinless chicken, eggs, beans, and tofu. ? Avoiding fatty meat, cured and processed meat, or chicken with skin. ? Avoiding premade or processed food.  Eat less than 1,500 mg of salt (sodium) a day.  Limit alcohol use to no more than 1 drink a day for nonpregnant women and 2 drinks a day for men. One drink equals 12 oz of beer, 5 oz of wine, or 1 oz of hard liquor. Lifestyle  Work with your doctor to stay at a healthy weight or to lose weight. Ask your doctor what the best weight is for you.  Get at least 30 minutes of exercise that causes your heart to beat faster (aerobic exercise) most days of the week. This may include walking, swimming, or biking.  Get at least 30 minutes of exercise that strengthens your muscles (resistance exercise) at least 3 days a week. This may include lifting weights or pilates.  Do not use any  products that contain nicotine or tobacco. This includes cigarettes and e-cigarettes. If you need help quitting, ask your doctor.  Check your blood pressure at home as told by your doctor.  Keep all follow-up visits as told by your doctor. This is important. Medicines  Take over-the-counter and prescription medicines only as told by your doctor. Follow directions carefully.  Do not skip doses of blood pressure medicine. The medicine does not work as well if you skip doses. Skipping doses also puts you at risk for problems.  Ask your doctor about side effects or reactions to medicines that you should watch for. Contact a doctor if:  You think you are having a reaction to the medicine you are taking.  You have headaches that keep coming back (recurring).  You feel dizzy.  You have swelling in your ankles.  You have trouble with your vision. Get help right away if:  You get a very bad headache.  You start to feel confused.  You feel weak or numb.  You feel faint.  You get very bad pain in your: ? Chest. ? Belly (abdomen).  You throw up (vomit) more than once.  You have trouble breathing. Summary  Hypertension is another name for high blood pressure.  Making healthy choices can help lower blood pressure. If your blood pressure cannot be controlled with healthy choices, you may need to take medicine. This information is not intended to replace advice given to you by your health care   provider. Make sure you discuss any questions you have with your health care provider. Document Released: 06/10/2007 Document Revised: 11/20/2015 Document Reviewed: 11/20/2015 Elsevier Interactive Patient Education  2018 [place].  Generalized Anxiety Disorder, Adult Generalized anxiety disorder (GAD) is a mental health disorder. People with this condition constantly worry about everyday events. Unlike normal anxiety, worry related to GAD is not triggered by a specific event. These  worries also do not fade or get better with time. GAD interferes with life functions, including relationships, work, and school. GAD can vary from mild to severe. People with severe GAD can have intense waves of anxiety with physical symptoms (panic attacks). What are the causes? The exact cause of GAD is not known. What increases the risk? This condition is more likely to develop in:  Women.  People who have a family history of anxiety disorders.  People who are very shy.  People who experience very stressful life events, such as the death of a loved one.  People who have a very stressful family environment.  What are the signs or symptoms? People with GAD often worry excessively about many things in their lives, such as their health and family. They may also be overly concerned about:  Doing well at work.  Being on time.  Natural disasters.  Friendships.  Physical symptoms of GAD include:  Fatigue.  Muscle tension or having muscle twitches.  Trembling or feeling shaky.  Being easily startled.  Feeling like your heart is pounding or racing.  Feeling out of breath or like you cannot take a deep breath.  Having trouble falling asleep or staying asleep.  Sweating.  Nausea, diarrhea, or irritable bowel syndrome (IBS).  Headaches.  Trouble concentrating or remembering facts.  Restlessness.  Irritability.  How is this diagnosed? Your health care provider can diagnose GAD based on your symptoms and medical history. You will also have a physical exam. The health care provider will ask specific questions about your symptoms, including how severe they are, when they started, and if they come and go. Your health care provider may ask you about your use of alcohol or drugs, including prescription medicines. Your health care provider may refer you to a mental health specialist for further evaluation. Your health care provider will do a thorough examination and may  perform additional tests to rule out other possible causes of your symptoms. To be diagnosed with GAD, a person must have anxiety that:  Is out of his or her control.  Affects several different aspects of his or her life, such as work and relationships.  Causes distress that makes him or her unable to take part in normal activities.  Includes at least three physical symptoms of GAD, such as restlessness, fatigue, trouble concentrating, irritability, muscle tension, or sleep problems.  Before your health care provider can confirm a diagnosis of GAD, these symptoms must be present more days than they are not, and they must last for six months or longer. How is this treated? The following therapies are usually used to treat GAD:  Medicine. Antidepressant medicine is usually prescribed for long-term daily control. Antianxiety medicines may be added in severe cases, especially when panic attacks occur.  Talk therapy (psychotherapy). Certain types of talk therapy can be helpful in treating GAD by providing support, education, and guidance. Options include: ? Cognitive behavioral therapy (CBT). People learn coping skills and techniques to ease their anxiety. They learn to identify unrealistic or negative thoughts and behaviors and to replace them with  positive ones. ? Acceptance and commitment therapy (ACT). This treatment teaches people how to be mindful as a way to cope with unwanted thoughts and feelings. ? Biofeedback. This process trains you to manage your body's response (physiological response) through breathing techniques and relaxation methods. You will work with a therapist while machines are used to monitor your physical symptoms.  Stress management techniques. These include yoga, meditation, and exercise.  A mental health specialist can help determine which treatment is best for you. Some people see improvement with one type of therapy. However, other people require a combination of  therapies. Follow these instructions at home:  Take over-the-counter and prescription medicines only as told by your health care provider.  Try to maintain a normal routine.  Try to anticipate stressful situations and allow extra time to manage them.  Practice any stress management or self-calming techniques as taught by your health care provider.  Do not punish yourself for setbacks or for not making progress.  Try to recognize your accomplishments, even if they are small.  Keep all follow-up visits as told by your health care provider. This is important. Contact a health care provider if:  Your symptoms do not get better.  Your symptoms get worse.  You have signs of depression, such as: ? A persistently sad, cranky, or irritable mood. ? Loss of enjoyment in activities that used to bring you joy. ? Change in weight or eating. ? Changes in sleeping habits. ? Avoiding friends or family members. ? Loss of energy for normal tasks. ? Feelings of guilt or worthlessness. Get help right away if:  You have serious thoughts about hurting yourself or others. If you ever feel like you may hurt yourself or others, or have thoughts about taking your own life, get help right away. You can go to your nearest emergency department or call:  Your local emergency services (911 in the [place]  A suicide crisis helpline, such as the [facility] at (854)284-5664. This is open 24 hours a day.  Summary  Generalized anxiety disorder (GAD) is a mental health disorder that involves worry that is not triggered by a specific event.  People with GAD often worry excessively about many things in their lives, such as their health and family.  GAD may cause physical symptoms such as restlessness, trouble concentrating, sleep problems, frequent sweating, nausea, diarrhea, headaches, and trembling or muscle twitching.  A mental health specialist can help determine which treatment  is best for you. Some people see improvement with one type of therapy. However, other people require a combination of therapies. This information is not intended to replace advice given to you by your health care provider. Make sure you discuss any questions you have with your health care provider. Document Released: 04/18/2012 Document Revised: 11/12/2015 Document Reviewed: 11/12/2015 Elsevier Interactive Patient Education  2018 [place].  Generalized Anxiety Disorder, Adult Generalized anxiety disorder (GAD) is a mental health disorder. People with this condition constantly worry about everyday events. Unlike normal anxiety, worry related to GAD is not triggered by a specific event. These worries also do not fade or get better with time. GAD interferes with life functions, including relationships, work, and school. GAD can vary from mild to severe. People with severe GAD can have intense waves of anxiety with physical symptoms (panic attacks). What are the causes? The exact cause of GAD is not known. What increases the risk? This condition is more likely to develop in:  Women.  People who  have a family history of anxiety disorders.  People who are very shy.  People who experience very stressful life events, such as the death of a loved one.  People who have a very stressful family environment.  What are the signs or symptoms? People with GAD often worry excessively about many things in their lives, such as their health and family. They may also be overly concerned about:  Doing well at work.  Being on time.  Natural disasters.  Friendships.  Physical symptoms of GAD include:  Fatigue.  Muscle tension or having muscle twitches.  Trembling or feeling shaky.  Being easily startled.  Feeling like your heart is pounding or racing.  Feeling out of breath or like you cannot take a deep breath.  Having trouble falling asleep or staying asleep.  Sweating.  Nausea,  diarrhea, or irritable bowel syndrome (IBS).  Headaches.  Trouble concentrating or remembering facts.  Restlessness.  Irritability.  How is this diagnosed? Your health care provider can diagnose GAD based on your symptoms and medical history. You will also have a physical exam. The health care provider will ask specific questions about your symptoms, including how severe they are, when they started, and if they come and go. Your health care provider may ask you about your use of alcohol or drugs, including prescription medicines. Your health care provider may refer you to a mental health specialist for further evaluation. Your health care provider will do a thorough examination and may perform additional tests to rule out other possible causes of your symptoms. To be diagnosed with GAD, a person must have anxiety that:  Is out of his or her control.  Affects several different aspects of his or her life, such as work and relationships.  Causes distress that makes him or her unable to take part in normal activities.  Includes at least three physical symptoms of GAD, such as restlessness, fatigue, trouble concentrating, irritability, muscle tension, or sleep problems.  Before your health care provider can confirm a diagnosis of GAD, these symptoms must be present more days than they are not, and they must last for six months or longer. How is this treated? The following therapies are usually used to treat GAD:  Medicine. Antidepressant medicine is usually prescribed for long-term daily control. Antianxiety medicines may be added in severe cases, especially when panic attacks occur.  Talk therapy (psychotherapy). Certain types of talk therapy can be helpful in treating GAD by providing support, education, and guidance. Options include: ? Cognitive behavioral therapy (CBT). People learn coping skills and techniques to ease their anxiety. They learn to identify unrealistic or negative  thoughts and behaviors and to replace them with positive ones. ? Acceptance and commitment therapy (ACT). This treatment teaches people how to be mindful as a way to cope with unwanted thoughts and feelings. ? Biofeedback. This process trains you to manage your body's response (physiological response) through breathing techniques and relaxation methods. You will work with a therapist while machines are used to monitor your physical symptoms.  Stress management techniques. These include yoga, meditation, and exercise.  A mental health specialist can help determine which treatment is best for you. Some people see improvement with one type of therapy. However, other people require a combination of therapies. Follow these instructions at home:  Take over-the-counter and prescription medicines only as told by your health care provider.  Try to maintain a normal routine.  Try to anticipate stressful situations and allow extra time to manage them.  Practice any stress management or self-calming techniques as taught by your health care provider.  Do not punish yourself for setbacks or for not making progress.  Try to recognize your accomplishments, even if they are small.  Keep all follow-up visits as told by your health care provider. This is important. Contact a health care provider if:  Your symptoms do not get better.  Your symptoms get worse.  You have signs of depression, such as: ? A persistently sad, cranky, or irritable mood. ? Loss of enjoyment in activities that used to bring you joy. ? Change in weight or eating. ? Changes in sleeping habits. ? Avoiding friends or family members. ? Loss of energy for normal tasks. ? Feelings of guilt or worthlessness. Get help right away if:  You have serious thoughts about hurting yourself or others. If you ever feel like you may hurt yourself or others, or have thoughts about taking your own life, get help right away. You can go to your  nearest emergency department or call:  Your local emergency services (911 in the [place]  A suicide crisis helpline, such as the [facility] at 229-325-1695. This is open 24 hours a day.  Summary  Generalized anxiety disorder (GAD) is a mental health disorder that involves worry that is not triggered by a specific event.  People with GAD often worry excessively about many things in their lives, such as their health and family.  GAD may cause physical symptoms such as restlessness, trouble concentrating, sleep problems, frequent sweating, nausea, diarrhea, headaches, and trembling or muscle twitching.  A mental health specialist can help determine which treatment is best for you. Some people see improvement with one type of therapy. However, other people require a combination of therapies. This information is not intended to replace advice given to you by your health care provider. Make sure you discuss any questions you have with your health care provider. Document Released: 04/18/2012 Document Revised: 11/12/2015 Document Reviewed: 11/12/2015 Elsevier Interactive Patient Education  [employer].

## 2017-05-13 LAB — URIC ACID: URIC ACID: 6.9 mg/dL (ref 3.7–8.6)

## 2017-05-13 LAB — BRAIN NATRIURETIC PEPTIDE: BNP: 165.3 pg/mL — ABNORMAL HIGH (ref 0.0–100.0)

## 2017-05-17 ENCOUNTER — Other Ambulatory Visit: Payer: Self-pay | Admitting: Nurse Practitioner

## 2017-05-17 DIAGNOSIS — M1A09X0 Idiopathic chronic gout, multiple sites, without tophus (tophi): Secondary | ICD-10-CM

## 2017-05-17 MED ORDER — ALLOPURINOL 100 MG PO TABS: 200.0000 mg | ORAL_TABLET | Freq: Every day | ORAL | 1 refills | Status: DC

## 2017-05-18 ENCOUNTER — Other Ambulatory Visit: Payer: Self-pay | Admitting: Nurse Practitioner

## 2017-05-18 ENCOUNTER — Telehealth: Payer: Self-pay

## 2017-05-18 DIAGNOSIS — M1A09X0 Idiopathic chronic gout, multiple sites, without tophus (tophi): Secondary | ICD-10-CM

## 2017-05-18 MED ORDER — ALLOPURINOL 100 MG PO TABS: 200.0000 mg | ORAL_TABLET | Freq: Every day | ORAL | 1 refills | Status: DC

## 2017-05-18 NOTE — Telephone Encounter (Signed)
-----   Message from Gildardo Pounds, NP sent at 05/17/2017  7:33 PM EDT ----- BNP to help determine heart failure has improved however I am still awaiting your chest xray. Continue on your furosemide/lasix as prescribed. Uric acid for gout has improved from 9.0 to 6.9. Will increase your allopurinol to 200 mg daily.

## 2017-05-18 NOTE — Telephone Encounter (Signed)
CMA called patient to inform on lab results and new RX.  Patient understood. Patient request to have his Rx send to [facility] on [address].  Rx sent.

## 2017-05-26 ENCOUNTER — Encounter: Payer: Self-pay | Admitting: *Deleted

## 2017-05-26 ENCOUNTER — Telehealth: Payer: Self-pay | Admitting: Cardiology

## 2017-05-26 NOTE — Telephone Encounter (Signed)
LMOVM reminding pt to send remote transmission.   

## 2017-05-27 ENCOUNTER — Encounter: Payer: Self-pay | Admitting: Cardiology

## 2017-06-04 ENCOUNTER — Ambulatory Visit (INDEPENDENT_AMBULATORY_CARE_PROVIDER_SITE_OTHER): Payer: Self-pay | Admitting: *Deleted

## 2017-06-04 DIAGNOSIS — I428 Other cardiomyopathies: Secondary | ICD-10-CM

## 2017-06-07 ENCOUNTER — Encounter: Payer: Self-pay | Admitting: Cardiology

## 2017-06-07 NOTE — Progress Notes (Signed)
Remote ICD transmission.   

## 2017-06-17 LAB — CUP PACEART REMOTE DEVICE CHECK
Date Time Interrogation Session: 20190613091557
Implantable Lead Implant Date: 20150601
Implantable Lead Model: 295
Implantable Lead Serial Number: 134196
Lead Channel Setting Pacing Amplitude: 2.5 V
MDC IDC LEAD LOCATION: 753860
MDC IDC PG IMPLANT DT: 20150601
MDC IDC SET LEADCHNL RV PACING PULSEWIDTH: 0.4 ms
MDC IDC SET LEADCHNL RV SENSING SENSITIVITY: 0.6 mV
Pulse Gen Serial Number: 190689

## 2017-08-09 ENCOUNTER — Telehealth (INDEPENDENT_AMBULATORY_CARE_PROVIDER_SITE_OTHER): Payer: Self-pay | Admitting: Nurse Practitioner

## 2017-08-09 NOTE — Telephone Encounter (Signed)
Patient called requesting medication refill for   " Heart Pills" and for   traZODone (DESYREL) 150 MG tablet    Patient uses [id number], [place]  Please advice [phone]  Thank you Emmit Pomfret

## 2017-08-09 NOTE — Telephone Encounter (Signed)
Left voicemail to notify patient refill was denied and that he needs an appointment. Thank you Emmit Pomfret

## 2017-08-09 NOTE — Telephone Encounter (Signed)
Denied  Needs appointment

## 2017-09-07 ENCOUNTER — Ambulatory Visit (INDEPENDENT_AMBULATORY_CARE_PROVIDER_SITE_OTHER): Payer: Self-pay | Admitting: *Deleted

## 2017-09-07 DIAGNOSIS — I428 Other cardiomyopathies: Secondary | ICD-10-CM

## 2017-09-07 NOTE — Progress Notes (Signed)
Remote ICD transmission.   

## 2017-09-16 ENCOUNTER — Other Ambulatory Visit: Payer: Self-pay | Admitting: Nurse Practitioner

## 2017-09-16 DIAGNOSIS — F5104 Psychophysiologic insomnia: Secondary | ICD-10-CM

## 2017-09-17 ENCOUNTER — Other Ambulatory Visit: Payer: Self-pay | Admitting: Nurse Practitioner

## 2017-09-17 DIAGNOSIS — F5104 Psychophysiologic insomnia: Secondary | ICD-10-CM

## 2017-09-20 ENCOUNTER — Encounter: Payer: Self-pay | Admitting: Nurse Practitioner

## 2017-09-20 ENCOUNTER — Ambulatory Visit: Payer: Self-pay | Attending: Nurse Practitioner | Admitting: Nurse Practitioner

## 2017-09-20 VITALS — BP 157/82 | HR 77 | Temp 99.0°F | Ht 64.0 in | Wt 199.0 lb

## 2017-09-20 DIAGNOSIS — Z8679 Personal history of other diseases of the circulatory system: Secondary | ICD-10-CM

## 2017-09-20 DIAGNOSIS — E782 Mixed hyperlipidemia: Secondary | ICD-10-CM | POA: Insufficient documentation

## 2017-09-20 DIAGNOSIS — M1A9XX0 Chronic gout, unspecified, without tophus (tophi): Secondary | ICD-10-CM | POA: Insufficient documentation

## 2017-09-20 DIAGNOSIS — G4700 Insomnia, unspecified: Secondary | ICD-10-CM | POA: Insufficient documentation

## 2017-09-20 DIAGNOSIS — Z91013 Allergy to seafood: Secondary | ICD-10-CM | POA: Insufficient documentation

## 2017-09-20 DIAGNOSIS — Z832 Family history of diseases of the blood and blood-forming organs and certain disorders involving the immune mechanism: Secondary | ICD-10-CM | POA: Insufficient documentation

## 2017-09-20 DIAGNOSIS — I509 Heart failure, unspecified: Secondary | ICD-10-CM | POA: Insufficient documentation

## 2017-09-20 DIAGNOSIS — Z800 Family history of malignant neoplasm of digestive organs: Secondary | ICD-10-CM | POA: Insufficient documentation

## 2017-09-20 DIAGNOSIS — Z79899 Other long term (current) drug therapy: Secondary | ICD-10-CM | POA: Insufficient documentation

## 2017-09-20 DIAGNOSIS — I428 Other cardiomyopathies: Secondary | ICD-10-CM | POA: Insufficient documentation

## 2017-09-20 DIAGNOSIS — I10 Essential (primary) hypertension: Secondary | ICD-10-CM

## 2017-09-20 DIAGNOSIS — Z888 Allergy status to other drugs, medicaments and biological substances status: Secondary | ICD-10-CM | POA: Insufficient documentation

## 2017-09-20 DIAGNOSIS — Z95810 Presence of automatic (implantable) cardiac defibrillator: Secondary | ICD-10-CM | POA: Insufficient documentation

## 2017-09-20 DIAGNOSIS — Z91018 Allergy to other foods: Secondary | ICD-10-CM | POA: Insufficient documentation

## 2017-09-20 DIAGNOSIS — Z1211 Encounter for screening for malignant neoplasm of colon: Secondary | ICD-10-CM | POA: Insufficient documentation

## 2017-09-20 DIAGNOSIS — I2510 Atherosclerotic heart disease of native coronary artery without angina pectoris: Secondary | ICD-10-CM | POA: Insufficient documentation

## 2017-09-20 DIAGNOSIS — Z8269 Family history of other diseases of the musculoskeletal system and connective tissue: Secondary | ICD-10-CM | POA: Insufficient documentation

## 2017-09-20 DIAGNOSIS — Z820 Family history of epilepsy and other diseases of the nervous system: Secondary | ICD-10-CM | POA: Insufficient documentation

## 2017-09-20 DIAGNOSIS — M1A09X0 Idiopathic chronic gout, multiple sites, without tophus (tophi): Secondary | ICD-10-CM

## 2017-09-20 DIAGNOSIS — F329 Major depressive disorder, single episode, unspecified: Secondary | ICD-10-CM | POA: Insufficient documentation

## 2017-09-20 DIAGNOSIS — Z8249 Family history of ischemic heart disease and other diseases of the circulatory system: Secondary | ICD-10-CM | POA: Insufficient documentation

## 2017-09-20 DIAGNOSIS — F419 Anxiety disorder, unspecified: Secondary | ICD-10-CM | POA: Insufficient documentation

## 2017-09-20 DIAGNOSIS — Z955 Presence of coronary angioplasty implant and graft: Secondary | ICD-10-CM | POA: Insufficient documentation

## 2017-09-20 DIAGNOSIS — F32A Depression, unspecified: Secondary | ICD-10-CM

## 2017-09-20 DIAGNOSIS — R0789 Other chest pain: Secondary | ICD-10-CM | POA: Insufficient documentation

## 2017-09-20 DIAGNOSIS — I110 Hypertensive heart disease with heart failure: Secondary | ICD-10-CM | POA: Insufficient documentation

## 2017-09-20 MED ORDER — ESCITALOPRAM OXALATE 20 MG PO TABS: 20.0000 mg | ORAL_TABLET | Freq: Every day | ORAL | 1 refills | Status: DC

## 2017-09-20 MED ORDER — ATORVASTATIN CALCIUM 40 MG PO TABS: 40.0000 mg | ORAL_TABLET | Freq: Every day | ORAL | 3 refills | Status: DC

## 2017-09-20 MED ORDER — LOSARTAN POTASSIUM 50 MG PO TABS: 50.0000 mg | ORAL_TABLET | Freq: Every day | ORAL | 3 refills | Status: DC

## 2017-09-20 MED ORDER — NITROGLYCERIN 0.4 MG SL SUBL: 0.4000 mg | SUBLINGUAL_TABLET | SUBLINGUAL | 1 refills | Status: DC | PRN

## 2017-09-20 NOTE — Progress Notes (Signed)
Assessment & Plan:  Stanley [name] was seen today for chest pain.  Diagnoses and all orders for this visit:  Essential hypertension -     losartan (COZAAR) 50 MG tablet; Take 1 tablet (50 mg total) by mouth daily. -     CMP14+EGFR -     CBC Continue all antihypertensives as prescribed.  Remember to bring in your blood pressure log with you for your follow up appointment.  DASH/Mediterranean Diets are healthier choices for HTN.   Mixed hyperlipidemia -     atorvastatin (LIPITOR) 40 MG tablet; Take 1 tablet (40 mg total) by mouth daily. INSTRUCTIONS: Work on a low fat, heart healthy diet and participate in regular aerobic exercise program by working out at least 150 minutes per week; 5 days a week-30 minutes per day. Avoid red meat, fried foods. junk foods, sodas, sugary drinks, unhealthy snacking, alcohol and smoking.  Drink at least 48oz of water per day and monitor your carbohydrate intake daily.    Anxiety and depression -     escitalopram (LEXAPRO) 20 MG tablet; Take 1 tablet (20 mg total) by mouth daily.  Colon cancer screening -     Fecal occult blood, imunochemical([facility])  Chronic gout of multiple sites, unspecified cause -     Uric Acid  History of heart failure -     Brain natriuretic peptide  Other chest pain -     nitroGLYCERIN (NITROSTAT) 0.4 MG SL tablet; Place 1 tablet (0.4 mg total) under the tongue every 5 (five) minutes x 3 doses as needed for chest pain.     Patient has been counseled on age-appropriate routine health concerns for screening and prevention. These are reviewed and up-to-date. Referrals have been placed accordingly. Immunizations are up-to-date or declined.   Patient has been advised to apply for financial assistance and schedule to see our financial counselor.  Subjective:   Chief Complaint  Patient presents with  . Chest Pain    Pt. is here stated he been having chest pain for two weeks. Pt. stated his back hurt and think his gout  is back.    HPI Stanley [name] 52 y.o. male presents to office today with complaints of intermittent chest pain.     Chest Pain: Patient complains of chest pain. Onset was a few months ago, with worsening course since that time. He endorses pain every morning that subsides after several minutes. The patient describes the pain as intermittent, occurring with increasing frequency, sharp and substernal in nature, radiates to the left arm. Patient rates pain as a 7/10 in intensity.  Associated symptoms are chest pressure/discomfort and dyspnea. Aggravating factors are none.  Alleviating factors are: nitroglycerin 1 tablets. Patient's cardiac risk factors are dyslipidemia, hypertension, male gender and obesity (BMI >= 30 kg/m2).  Patient's risk factors for DVT/PE: none. He missed his cardiology appointment in May and states he was sick.  He is not compliant with any of his medications. Many of the medications have not been refilled for several months. He denies any shocks from his AICD or any current chest pain.    GOUT He is not compliant with taking allopurinol daily. Endorses right foot pain and swelling. He is not diet compliant.  Lab Results  Component Value Date   LABURIC 6.9 05/12/2017   Depression He is not taking his Lexapro. He is only taking trazodone and continues to endorse depression. I have instructed him to resume his lexapro. The trazodone does provide relief of his insomnia. He does  endorse ruminating thoughts of harming himself but none currently. Declines speaking with the [facility] SW today. Will restart lexapro and follow up in a few weeks.  Depression screen PHQ 2/9 09/20/2017  Decreased Interest -  Down, Depressed, Hopeless 3  PHQ - 2 Score 3  Altered sleeping 3  Tired, decreased energy 3  Change in appetite 2  Feeling bad or failure about yourself  3  Trouble concentrating 2  Moving slowly or fidgety/restless 3  Suicidal thoughts 2  PHQ-9 Score 21    CHRONIC  HYPERTENSION Disease Monitoring  Blood pressure range BP Readings from Last 3 Encounters:  09/20/17 (!) 157/82  05/12/17 126/73  04/12/17 (!) 152/98  He does not monitor his blood pressure at home.   Chest pain: yes   Dyspnea: yes   Claudication: no  Medication compliance: no  Medication Side Effects  Lightheadedness: no   Urinary frequency: no   Edema: no   Impotence: yes  Preventitive Healthcare:  Exercise: no   Diet Pattern: salt added to cooking and diet: general  Salt Restriction:  No    Hyperlipidemia Patient presents for follow up to hyperlipidemia.  He is not medication compliant. He is not diet compliant and denies skin xanthelasma or statin intolerance including myalgias.  Lab Results  Component Value Date   CHOL 257 (H) 04/12/2017   Lab Results  Component Value Date   HDL 48 04/12/2017   Lab Results  Component Value Date   LDLCALC 172 (H) 04/12/2017   Lab Results  Component Value Date   TRIG 184 (H) 04/12/2017   Lab Results  Component Value Date   CHOLHDL 5.4 (H) 04/12/2017   Review of Systems  Constitutional: Negative for fever, malaise/fatigue and weight loss.  HENT: Negative.  Negative for nosebleeds.   Eyes: Negative.  Negative for blurred vision, double vision and photophobia.  Respiratory: Positive for shortness of breath. Negative for cough and wheezing.   Cardiovascular: Positive for chest pain. Negative for palpitations and leg swelling.  Gastrointestinal: Positive for heartburn. Negative for nausea and vomiting.  Genitourinary:       Erectile dysfunction  Musculoskeletal: Positive for joint pain. Negative for myalgias.  Neurological: Negative.  Negative for dizziness, focal weakness, seizures and headaches.  Psychiatric/Behavioral: Positive for depression. Negative for suicidal ideas.    Past Medical History:  Diagnosis Date  . Acute renal failure (ARF) ([place])   . CAD (coronary artery disease)    PCI in 2011  . CHF (congestive heart  failure) ([place])   . Gout   . Gouty arthritis   . Hypertension   . Insomnia   . Nonischemic cardiomyopathy ([place])   . Ventricular tachycardia ([place]) 01/2015   ATP delivered for sinus tach, degenerated into VT for which he received shock    Past Surgical History:  Procedure Laterality Date  . CARDIAC DEFIBRILLATOR PLACEMENT  06/05/13   [employer] Inogen ICD implanted at [facility] in [place] for primary prevention  . HERNIA REPAIR    . PERCUTANEOUS CORONARY STENT INTERVENTION (PCI-S)  2011    Family History  Problem Relation Age of Onset  . Cancer Mother        Pancreatic  . Hypertension Father   . Alzheimer's disease Father   . Gout Father   . Dementia Father   . Hypertension Sister   . Clotting disorder Sister   . Obesity Brother   . Bronchitis Brother   . Heart disease Maternal Grandmother   . Gout  Paternal Grandfather     Social History Reviewed with no changes to be made today.   Outpatient Medications Prior to Visit  Medication Sig Dispense Refill  . allopurinol (ZYLOPRIM) 100 MG tablet Take 2 tablets (200 mg total) by mouth daily. 60 tablet 1  . carvedilol (COREG) 25 MG tablet Take 1 tablet (25 mg total) by mouth 2 (two) times daily with a meal. 60 tablet 0  . digoxin (LANOXIN) 0.125 MG tablet Take 1 tablet (0.125 mg total) by mouth daily. 30 tablet 0  . potassium chloride SA (K-DUR,KLOR-CON) 20 MEQ tablet Take 1 tablet by mouth daily.  9  . traZODone (DESYREL) 150 MG tablet Take 1 tablet (150 mg total) by mouth at bedtime. 30 tablet 0  . atorvastatin (LIPITOR) 40 MG tablet Take 1 tablet (40 mg total) by mouth daily. 90 tablet 3  . losartan (COZAAR) 50 MG tablet Take 1 tablet (50 mg total) by mouth daily. 90 tablet 3  . nitroGLYCERIN (NITROSTAT) 0.4 MG SL tablet Place 1 tablet (0.4 mg total) under the tongue every 5 (five) minutes x 3 doses as needed for chest pain. 30 tablet 0  . busPIRone (BUSPAR) 10 MG tablet Take 1 tablet (10 mg total) by mouth 3  (three) times daily. (Patient not taking: Reported on 09/20/2017) 60 tablet 1  . furosemide (LASIX) 80 MG tablet Take 1 tablet (80 mg total) by mouth daily. 90 tablet 3  . omeprazole (PRILOSEC) 20 MG capsule Take 1 capsule (20 mg total) by mouth daily. (Patient not taking: Reported on 09/20/2017) 14 capsule 0  . escitalopram (LEXAPRO) 20 MG tablet Take 1 tablet (20 mg total) by mouth daily. (Patient not taking: Reported on 09/20/2017) 30 tablet 1   No facility-administered medications prior to visit.     Allergies  Allergen Reactions  . Apple     unknown  . Lisinopril     UNKNOWN  . Other     Nuts - unknown reaction  . Shellfish Allergy     UNKNOWN       Objective:    BP (!) 157/82 (BP Location: Left Arm, Patient Position: Sitting, Cuff Size: Large)   Pulse 77   Temp 99 F (37.2 C) (Oral)   Ht '5\' 4"'$  (1.626 m)   Wt 199 lb (90.3 kg)   SpO2 93%   BMI 34.16 kg/m  Wt Readings from Last 3 Encounters:  09/20/17 199 lb (90.3 kg)  05/12/17 183 lb (83 kg)  04/12/17 186 lb 12.8 oz (84.7 kg)    Physical Exam  Constitutional: He is oriented to person, place, and time. He appears well-developed and well-nourished. He is cooperative.  HENT:  Head: Normocephalic and atraumatic.  Eyes: EOM are normal.  Neck: Normal range of motion.  Cardiovascular: Normal rate, regular rhythm, normal heart sounds and intact distal pulses. Exam reveals no gallop and no friction rub.  No murmur heard. Pulmonary/Chest: Effort normal and breath sounds normal. No tachypnea. No respiratory distress. He has no decreased breath sounds. He has no wheezes. He has no rhonchi. He has no rales. He exhibits no tenderness.  Abdominal: Soft. Bowel sounds are normal.  Musculoskeletal: Normal range of motion. He exhibits no edema.  Neurological: He is alert and oriented to person, place, and time. Coordination normal.  Skin: Skin is warm and dry.  Psychiatric: He has a normal mood and affect. His speech is normal and  behavior is normal. Judgment normal. He expresses no homicidal and no suicidal ideation. He  expresses no suicidal plans and no homicidal plans.  Nursing note and vitals reviewed.      Patient has been counseled extensively about nutrition and exercise as well as the importance of adherence with medications and regular follow-up. The patient was given clear instructions to go to ER or return to medical center if symptoms don't improve, worsen or new problems develop. The patient verbalized understanding.   Follow-up: Return in about 6 weeks (around 11/01/2017) for HTN.   Gildardo Pounds, FNP-BC [facility] and [place], [place]   09/20/2017, 6:45 PM

## 2017-09-21 LAB — CBC
HEMOGLOBIN: 12.8 g/dL — AB (ref 13.0–17.7)
Hematocrit: 37.9 % (ref 37.5–51.0)
MCH: 32.5 pg (ref 26.6–33.0)
MCHC: 33.8 g/dL (ref 31.5–35.7)
MCV: 96 fL (ref 79–97)
Platelets: 210 10*3/uL (ref 150–450)
RBC: 3.94 x10E6/uL — ABNORMAL LOW (ref 4.14–5.80)
RDW: 11.7 % — ABNORMAL LOW (ref 12.3–15.4)
WBC: 10.3 10*3/uL (ref 3.4–10.8)

## 2017-09-21 LAB — CMP14+EGFR
ALT: 23 IU/L (ref 0–44)
AST: 23 IU/L (ref 0–40)
Albumin/Globulin Ratio: 1.5 (ref 1.2–2.2)
Albumin: 4.1 g/dL (ref 3.5–5.5)
Alkaline Phosphatase: 53 IU/L (ref 39–117)
BUN/Creatinine Ratio: 9 (ref 9–20)
BUN: 8 mg/dL (ref 6–24)
Bilirubin Total: 0.5 mg/dL (ref 0.0–1.2)
CALCIUM: 9 mg/dL (ref 8.7–10.2)
CO2: 19 mmol/L — ABNORMAL LOW (ref 20–29)
CREATININE: 0.94 mg/dL (ref 0.76–1.27)
Chloride: 102 mmol/L (ref 96–106)
GFR, EST AFRICAN AMERICAN: 106 mL/min/{1.73_m2} (ref >59)
GFR, EST NON AFRICAN AMERICAN: 92 mL/min/{1.73_m2} (ref >59)
GLOBULIN, TOTAL: 2.8 g/dL (ref 1.5–4.5)
Glucose: 110 mg/dL — ABNORMAL HIGH (ref 65–99)
POTASSIUM: 3.6 mmol/L (ref 3.5–5.2)
SODIUM: 138 mmol/L (ref 134–144)
TOTAL PROTEIN: 6.9 g/dL (ref 6.0–8.5)

## 2017-09-21 LAB — BRAIN NATRIURETIC PEPTIDE: BNP: 242.6 pg/mL — AB (ref 0.0–100.0)

## 2017-09-21 LAB — URIC ACID: URIC ACID: 6.6 mg/dL (ref 3.7–8.6)

## 2017-09-23 ENCOUNTER — Telehealth: Payer: Self-pay

## 2017-09-23 NOTE — Telephone Encounter (Signed)
-----   Message from Gildardo Pounds, NP sent at 09/21/2017  9:54 PM EDT ----- Uric acid continues to be elevated. Please make sure you are taking your allopurinol every day as prescribed. Will need to recheck your uric acid levels in 6 weeks. Please make a lab appointment. Your labs also show increased BNP. This could be reflective of your poorly controlled blood pressure, obstructive sleep apnea that has not been properly diagnosed, heart failure, or your chronic heart conditions. Please make sure you schedule your follow up appointment with cardiology and take your lasix and all other medications as prescribed. Once you are approved for financial assistance we can refer you for a sleep study

## 2017-09-23 NOTE — Telephone Encounter (Signed)
CMA spoke to patient to inform on results and advising.  Patient verified DOB. Patient understood.  Patient made a lab appt. On 11/04/2017 to Re-Check his Uric Acid.

## 2017-09-24 ENCOUNTER — Other Ambulatory Visit: Payer: Self-pay | Admitting: Nurse Practitioner

## 2017-09-24 DIAGNOSIS — M1A09X0 Idiopathic chronic gout, multiple sites, without tophus (tophi): Secondary | ICD-10-CM

## 2017-09-27 LAB — FECAL OCCULT BLOOD, IMMUNOCHEMICAL: FECAL OCCULT BLD: NEGATIVE

## 2017-09-28 ENCOUNTER — Telehealth: Payer: Self-pay

## 2017-09-28 LAB — CUP PACEART REMOTE DEVICE CHECK
Date Time Interrogation Session: 20190924093241
Implantable Lead Location: 753860
MDC IDC LEAD IMPLANT DT: 20150601
MDC IDC LEAD SERIAL: 134196
MDC IDC PG IMPLANT DT: 20150601
Pulse Gen Serial Number: 190689

## 2017-09-28 NOTE — Telephone Encounter (Signed)
-----   Message from Gildardo Pounds, NP sent at 09/27/2017  5:15 PM EDT ----- Stool sample was negative for blood

## 2017-09-28 NOTE — Telephone Encounter (Signed)
CMA spoke to patient to inform on results.  Patient verified DOB. Patient understood.  

## 2017-10-10 ENCOUNTER — Other Ambulatory Visit: Payer: Self-pay | Admitting: Nurse Practitioner

## 2017-10-10 DIAGNOSIS — F5104 Psychophysiologic insomnia: Secondary | ICD-10-CM

## 2017-11-02 ENCOUNTER — Ambulatory Visit: Payer: Self-pay | Admitting: Nurse Practitioner

## 2017-11-04 ENCOUNTER — Ambulatory Visit: Payer: Self-pay | Attending: Family Medicine

## 2017-11-04 DIAGNOSIS — M1A09X0 Idiopathic chronic gout, multiple sites, without tophus (tophi): Secondary | ICD-10-CM

## 2017-11-05 LAB — URIC ACID: URIC ACID: 7.8 mg/dL (ref 3.7–8.6)

## 2017-11-09 ENCOUNTER — Telehealth: Payer: Self-pay

## 2017-11-09 NOTE — Telephone Encounter (Addendum)
CMA attempt to reach patient to inform on results.  No answer and left a VM for patient.  If patient call back, please inform:  Uric acid has increased. Please make sure you are taking your allopurinol as prescribed. It does not appear that you are taking 200mg  of allopurinol every day .  A letter will send out to reach patient.

## 2017-11-09 NOTE — Telephone Encounter (Signed)
-----   Message from Gildardo Pounds, NP sent at 11/09/2017 12:03 AM EST ----- Uric acid has increased. Please make sure you are taking your allopurinol as prescribed. It does not appear that you are taking 200mg  of allopurinol every day .

## 2017-12-07 ENCOUNTER — Ambulatory Visit (INDEPENDENT_AMBULATORY_CARE_PROVIDER_SITE_OTHER): Payer: Self-pay

## 2017-12-07 DIAGNOSIS — I428 Other cardiomyopathies: Secondary | ICD-10-CM

## 2017-12-07 NOTE — Progress Notes (Signed)
Remote ICD transmission.   

## 2017-12-14 ENCOUNTER — Encounter: Payer: Self-pay | Admitting: Cardiology

## 2018-01-18 ENCOUNTER — Other Ambulatory Visit: Payer: Self-pay | Admitting: Nurse Practitioner

## 2018-01-18 DIAGNOSIS — F5104 Psychophysiologic insomnia: Secondary | ICD-10-CM

## 2018-01-24 LAB — CUP PACEART REMOTE DEVICE CHECK
Battery Remaining Percentage: 100 %
Brady Statistic RV Percent Paced: 0 %
HIGH POWER IMPEDANCE MEASURED VALUE: 54 Ohm
Implantable Lead Implant Date: 20150601
Implantable Lead Location: 753860
Implantable Lead Model: 295
Lead Channel Pacing Threshold Amplitude: 1.5 V
Lead Channel Pacing Threshold Pulse Width: 0.4 ms
Lead Channel Setting Pacing Pulse Width: 0.4 ms
Lead Channel Setting Sensing Sensitivity: 0.6 mV
MDC IDC LEAD SERIAL: 134196
MDC IDC MSMT BATTERY REMAINING LONGEVITY: 144 mo
MDC IDC MSMT LEADCHNL RV IMPEDANCE VALUE: 437 Ohm
MDC IDC PG IMPLANT DT: 20150601
MDC IDC PG SERIAL: 190689
MDC IDC SESS DTM: 20191203104800
MDC IDC SET LEADCHNL RV PACING AMPLITUDE: 2.5 V

## 2018-03-08 ENCOUNTER — Ambulatory Visit (INDEPENDENT_AMBULATORY_CARE_PROVIDER_SITE_OTHER): Payer: Self-pay | Admitting: *Deleted

## 2018-03-08 DIAGNOSIS — I428 Other cardiomyopathies: Secondary | ICD-10-CM

## 2018-03-09 LAB — CUP PACEART REMOTE DEVICE CHECK
Battery Remaining Longevity: 132 mo
Date Time Interrogation Session: 20200303133500
HighPow Impedance: 57 Ohm
Implantable Lead Location: 753860
Implantable Lead Model: 295
Implantable Lead Serial Number: 134196
Implantable Pulse Generator Implant Date: 20150601
Lead Channel Pacing Threshold Pulse Width: 0.4 ms
Lead Channel Setting Pacing Pulse Width: 0.4 ms
MDC IDC LEAD IMPLANT DT: 20150601
MDC IDC MSMT BATTERY REMAINING PERCENTAGE: 100 %
MDC IDC MSMT LEADCHNL RV IMPEDANCE VALUE: 442 Ohm
MDC IDC MSMT LEADCHNL RV PACING THRESHOLD AMPLITUDE: 1.5 V
MDC IDC SET LEADCHNL RV PACING AMPLITUDE: 2.5 V
MDC IDC SET LEADCHNL RV SENSING SENSITIVITY: 0.6 mV
MDC IDC STAT BRADY RV PERCENT PACED: 0 %
Pulse Gen Serial Number: 190689

## 2018-03-16 ENCOUNTER — Encounter: Payer: Self-pay | Admitting: Cardiology

## 2018-03-16 NOTE — Progress Notes (Signed)
Remote ICD transmission.   

## 2018-03-21 ENCOUNTER — Emergency Department (HOSPITAL_COMMUNITY): Payer: Medicaid Other

## 2018-03-21 ENCOUNTER — Observation Stay (HOSPITAL_COMMUNITY)
Admission: EM | Admit: 2018-03-21 | Discharge: 2018-03-22 | Disposition: A | Discharge status: Home / Self Care | Payer: Medicaid Other | Attending: Internal Medicine | Admitting: Internal Medicine

## 2018-03-21 ENCOUNTER — Encounter (HOSPITAL_COMMUNITY): Payer: Self-pay | Admitting: Emergency Medicine

## 2018-03-21 ENCOUNTER — Other Ambulatory Visit: Payer: Self-pay

## 2018-03-21 DIAGNOSIS — N179 Acute kidney failure, unspecified: Secondary | ICD-10-CM

## 2018-03-21 DIAGNOSIS — I10 Essential (primary) hypertension: Secondary | ICD-10-CM | POA: Diagnosis present

## 2018-03-21 DIAGNOSIS — Z95810 Presence of automatic (implantable) cardiac defibrillator: Secondary | ICD-10-CM

## 2018-03-21 DIAGNOSIS — Z9119 Patient's noncompliance with other medical treatment and regimen: Secondary | ICD-10-CM

## 2018-03-21 DIAGNOSIS — I110 Hypertensive heart disease with heart failure: Secondary | ICD-10-CM | POA: Insufficient documentation

## 2018-03-21 DIAGNOSIS — R748 Abnormal levels of other serum enzymes: Secondary | ICD-10-CM

## 2018-03-21 DIAGNOSIS — Z79899 Other long term (current) drug therapy: Secondary | ICD-10-CM | POA: Diagnosis not present

## 2018-03-21 DIAGNOSIS — R002 Palpitations: Secondary | ICD-10-CM | POA: Diagnosis not present

## 2018-03-21 DIAGNOSIS — I2510 Atherosclerotic heart disease of native coronary artery without angina pectoris: Secondary | ICD-10-CM | POA: Diagnosis not present

## 2018-03-21 DIAGNOSIS — T82198A Other mechanical complication of other cardiac electronic device, initial encounter: Secondary | ICD-10-CM | POA: Diagnosis not present

## 2018-03-21 DIAGNOSIS — Z91199 Patient's noncompliance with other medical treatment and regimen due to unspecified reason: Secondary | ICD-10-CM

## 2018-03-21 DIAGNOSIS — E782 Mixed hyperlipidemia: Secondary | ICD-10-CM

## 2018-03-21 DIAGNOSIS — F1010 Alcohol abuse, uncomplicated: Secondary | ICD-10-CM

## 2018-03-21 DIAGNOSIS — R0789 Other chest pain: Secondary | ICD-10-CM | POA: Diagnosis present

## 2018-03-21 DIAGNOSIS — Y69 Unspecified misadventure during surgical and medical care: Secondary | ICD-10-CM | POA: Diagnosis not present

## 2018-03-21 DIAGNOSIS — I472 Ventricular tachycardia, unspecified: Secondary | ICD-10-CM

## 2018-03-21 DIAGNOSIS — I509 Heart failure, unspecified: Secondary | ICD-10-CM | POA: Insufficient documentation

## 2018-03-21 DIAGNOSIS — R61 Generalized hyperhidrosis: Secondary | ICD-10-CM | POA: Diagnosis not present

## 2018-03-21 DIAGNOSIS — R079 Chest pain, unspecified: Secondary | ICD-10-CM

## 2018-03-21 DIAGNOSIS — G8929 Other chronic pain: Secondary | ICD-10-CM

## 2018-03-21 DIAGNOSIS — Z4502 Encounter for adjustment and management of automatic implantable cardiac defibrillator: Secondary | ICD-10-CM

## 2018-03-21 DIAGNOSIS — I428 Other cardiomyopathies: Secondary | ICD-10-CM

## 2018-03-21 HISTORY — DX: Presence of automatic (implantable) cardiac defibrillator: Z95.810

## 2018-03-21 LAB — COMPREHENSIVE METABOLIC PANEL
ALT: 55 U/L — ABNORMAL HIGH (ref 0–44)
AST: 58 U/L — ABNORMAL HIGH (ref 15–41)
Albumin: 4 g/dL (ref 3.5–5.0)
Alkaline Phosphatase: 56 U/L (ref 38–126)
Anion gap: 14 (ref 5–15)
BUN: 22 mg/dL — ABNORMAL HIGH (ref 6–20)
CO2: 24 mmol/L (ref 22–32)
Calcium: 9 mg/dL (ref 8.9–10.3)
Chloride: 102 mmol/L (ref 98–111)
Creatinine, Ser: 1.63 mg/dL — ABNORMAL HIGH (ref 0.61–1.24)
GFR calc Af Amer: 55 mL/min — ABNORMAL LOW (ref >60)
GFR, EST NON AFRICAN AMERICAN: 47 mL/min — AB (ref >60)
Glucose, Bld: 90 mg/dL (ref 70–99)
Potassium: 3.8 mmol/L (ref 3.5–5.1)
Sodium: 140 mmol/L (ref 135–145)
Total Bilirubin: 0.8 mg/dL (ref 0.3–1.2)
Total Protein: 8 g/dL (ref 6.5–8.1)

## 2018-03-21 LAB — CBC WITH DIFFERENTIAL/PLATELET
Abs Immature Granulocytes: 0.03 10*3/uL (ref 0.00–0.07)
Basophils Absolute: 0.1 10*3/uL (ref 0.0–0.1)
Basophils Relative: 1 %
Eosinophils Absolute: 0.2 10*3/uL (ref 0.0–0.5)
Eosinophils Relative: 2 %
HCT: 48.5 % (ref 39.0–52.0)
Hemoglobin: 15.7 g/dL (ref 13.0–17.0)
Immature Granulocytes: 0 %
Lymphocytes Relative: 36 %
Lymphs Abs: 3.1 10*3/uL (ref 0.7–4.0)
MCH: 32.6 pg (ref 26.0–34.0)
MCHC: 32.4 g/dL (ref 30.0–36.0)
MCV: 100.6 fL — ABNORMAL HIGH (ref 80.0–100.0)
Monocytes Absolute: 0.6 10*3/uL (ref 0.1–1.0)
Monocytes Relative: 7 %
Neutro Abs: 4.6 10*3/uL (ref 1.7–7.7)
Neutrophils Relative %: 54 %
PLATELETS: 225 10*3/uL (ref 150–400)
RBC: 4.82 MIL/uL (ref 4.22–5.81)
RDW: 12.7 % (ref 11.5–15.5)
WBC: 8.5 10*3/uL (ref 4.0–10.5)
nRBC: 0 % (ref 0.0–0.2)

## 2018-03-21 LAB — I-STAT TROPONIN, ED: Troponin i, poc: 0.03 ng/mL (ref 0.00–0.08)

## 2018-03-21 LAB — RAPID URINE DRUG SCREEN, HOSP PERFORMED
Amphetamines: NOT DETECTED
Barbiturates: NOT DETECTED
Benzodiazepines: NOT DETECTED
Cocaine: NOT DETECTED
Opiates: NOT DETECTED
TETRAHYDROCANNABINOL: NOT DETECTED

## 2018-03-21 LAB — T4, FREE: Free T4: 1.03 ng/dL (ref 0.82–1.77)

## 2018-03-21 LAB — DIGOXIN LEVEL: Digoxin Level: <0.2 ng/mL — ABNORMAL LOW (ref 0.8–2.0)

## 2018-03-21 LAB — TSH: TSH: 0.485 u[IU]/mL (ref 0.350–4.500)

## 2018-03-21 LAB — BRAIN NATRIURETIC PEPTIDE: B Natriuretic Peptide: 33.7 pg/mL (ref 0.0–100.0)

## 2018-03-21 LAB — MAGNESIUM: Magnesium: 1.9 mg/dL (ref 1.7–2.4)

## 2018-03-21 MED ORDER — VITAMIN B-1 100 MG PO TABS
100.0000 mg | ORAL_TABLET | Freq: Every day | ORAL | Status: DC
  Administered 2018-03-21 – 2018-03-22 (×2): 100 mg via ORAL
  Filled 2018-03-21 (×2): qty 1

## 2018-03-21 MED ORDER — ADULT MULTIVITAMIN W/MINERALS CH
1.0000 | ORAL_TABLET | Freq: Every day | ORAL | Status: DC
  Administered 2018-03-21 – 2018-03-22 (×2): 1 via ORAL
  Filled 2018-03-21 (×2): qty 1

## 2018-03-21 MED ORDER — AMIODARONE HCL 200 MG PO TABS
200.0000 mg | ORAL_TABLET | Freq: Two times a day (BID) | ORAL | Status: DC
  Administered 2018-03-21 – 2018-03-22 (×2): 200 mg via ORAL
  Filled 2018-03-21 (×2): qty 1

## 2018-03-21 MED ORDER — LORAZEPAM 1 MG PO TABS: 1.0000 mg | ORAL_TABLET | Freq: Four times a day (QID) | ORAL | Status: DC | PRN

## 2018-03-21 MED ORDER — LORAZEPAM 2 MG/ML IJ SOLN: 1.0000 mg | Freq: Four times a day (QID) | INTRAMUSCULAR | Status: DC | PRN

## 2018-03-21 MED ORDER — MAGNESIUM SULFATE IN D5W 1-5 GM/100ML-% IV SOLN
1.0000 g | Freq: Once | INTRAVENOUS | Status: AC
  Administered 2018-03-21: 1 g via INTRAVENOUS
  Filled 2018-03-21: qty 100

## 2018-03-21 MED ORDER — HEPARIN SODIUM (PORCINE) 5000 UNIT/ML IJ SOLN
5000.0000 [IU] | Freq: Three times a day (TID) | INTRAMUSCULAR | Status: DC
  Administered 2018-03-21 – 2018-03-22 (×2): 5000 [IU] via SUBCUTANEOUS
  Filled 2018-03-21 (×2): qty 1

## 2018-03-21 MED ORDER — THIAMINE HCL 100 MG/ML IJ SOLN: 100.0000 mg | Freq: Every day | INTRAMUSCULAR | Status: DC

## 2018-03-21 MED ORDER — SODIUM CHLORIDE 0.9% FLUSH: 3.0000 mL | INTRAVENOUS | Status: DC | PRN

## 2018-03-21 MED ORDER — CARVEDILOL 25 MG PO TABS
25.0000 mg | ORAL_TABLET | Freq: Two times a day (BID) | ORAL | Status: DC
  Administered 2018-03-22: 25 mg via ORAL
  Filled 2018-03-21: qty 1

## 2018-03-21 MED ORDER — POTASSIUM CHLORIDE CRYS ER 20 MEQ PO TBCR
20.0000 meq | EXTENDED_RELEASE_TABLET | Freq: Every day | ORAL | Status: DC
  Administered 2018-03-22: 20 meq via ORAL
  Filled 2018-03-21: qty 1

## 2018-03-21 MED ORDER — LORAZEPAM 1 MG PO TABS: 0.0000 mg | ORAL_TABLET | Freq: Two times a day (BID) | ORAL | Status: DC

## 2018-03-21 MED ORDER — POTASSIUM CHLORIDE CRYS ER 20 MEQ PO TBCR
20.0000 meq | EXTENDED_RELEASE_TABLET | Freq: Once | ORAL | Status: AC
  Administered 2018-03-21: 20 meq via ORAL
  Filled 2018-03-21: qty 1

## 2018-03-21 MED ORDER — PNEUMOCOCCAL VAC POLYVALENT 25 MCG/0.5ML IJ INJ: 0.5000 mL | INJECTION | INTRAMUSCULAR | Status: DC

## 2018-03-21 MED ORDER — LORAZEPAM 1 MG PO TABS
0.0000 mg | ORAL_TABLET | Freq: Four times a day (QID) | ORAL | Status: DC
  Administered 2018-03-21 – 2018-03-22 (×2): 1 mg via ORAL
  Filled 2018-03-21 (×2): qty 2

## 2018-03-21 MED ORDER — ESCITALOPRAM OXALATE 10 MG PO TABS
20.0000 mg | ORAL_TABLET | Freq: Every day | ORAL | Status: DC
  Administered 2018-03-22: 20 mg via ORAL
  Filled 2018-03-21: qty 2

## 2018-03-21 MED ORDER — FOLIC ACID 1 MG PO TABS
1.0000 mg | ORAL_TABLET | Freq: Every day | ORAL | Status: DC
  Administered 2018-03-21 – 2018-03-22 (×2): 1 mg via ORAL
  Filled 2018-03-21 (×2): qty 1

## 2018-03-21 MED ORDER — SODIUM CHLORIDE 0.9% FLUSH
3.0000 mL | Freq: Two times a day (BID) | INTRAVENOUS | Status: DC
  Administered 2018-03-21 – 2018-03-22 (×2): 3 mL via INTRAVENOUS

## 2018-03-21 MED ORDER — PANTOPRAZOLE SODIUM 40 MG PO TBEC
40.0000 mg | DELAYED_RELEASE_TABLET | Freq: Every day | ORAL | Status: DC
  Administered 2018-03-22: 40 mg via ORAL
  Filled 2018-03-21: qty 1

## 2018-03-21 MED ORDER — ACETAMINOPHEN 325 MG PO TABS: 650.0000 mg | ORAL_TABLET | ORAL | Status: DC | PRN

## 2018-03-21 MED ORDER — TRAZODONE HCL 50 MG PO TABS
150.0000 mg | ORAL_TABLET | Freq: Every day | ORAL | Status: DC
  Administered 2018-03-21: 150 mg via ORAL
  Filled 2018-03-21: qty 1

## 2018-03-21 MED ORDER — SODIUM CHLORIDE 0.9 % IV SOLN: 250.0000 mL | INTRAVENOUS | Status: DC | PRN

## 2018-03-21 MED ORDER — ALLOPURINOL 100 MG PO TABS
200.0000 mg | ORAL_TABLET | Freq: Every day | ORAL | Status: DC
  Administered 2018-03-21 – 2018-03-22 (×2): 200 mg via ORAL
  Filled 2018-03-21 (×2): qty 2

## 2018-03-21 NOTE — ED Provider Notes (Signed)
[facility] EMERGENCY DEPARTMENT Provider Note   CSN: [id number] Arrival date & time: 03/21/18  1043    History   Chief Complaint No chief complaint on file.   HPI Stanley [name] is a 53 y.o. male with a past medical history of CAD, CHF, hypertension, ICD in place presents to ED for ICD firing times two 1 hour prior to arrival.  States that he was laying in bed when he became diaphoretic, felt his heart racing and felt his defibrillator fire.  Since then he has had a dull pain in his left chest but denies any shortness of breath.  He denies any loss of consciousness.  States that it fired again several minutes later.  He has not seen his cardiologist in 1 year due to insurance reasons.  He does note that he is somewhat noncompliant with his medications due to "sometimes I did get depressed and I do not want to take anything."  He is compliant with his medications currently.  Patient does appear to be a poor historian so I am unable to obtain what I believe is an accurate history.  States that "I really do not want to be here, I do not want to catch the coronavirus."     HPI  Past Medical History:  Diagnosis Date  . Acute renal failure (ARF) ([place])   . CAD (coronary artery disease)    PCI in 2011  . CHF (congestive heart failure) ([place])   . Gout   . Gouty arthritis   . Hypertension   . Insomnia   . Nonischemic cardiomyopathy ([place])   . Ventricular tachycardia ([place]) 01/2015   ATP delivered for sinus tach, degenerated into VT for which he received shock    Patient Active Problem List   Diagnosis Date Noted  . Gout 09/25/2015  . Cervical paraspinal muscle spasm 09/25/2015  . Nonischemic cardiomyopathy ([place]) 07/03/2015  . Chronic systolic dysfunction of left ventricle 07/03/2015  . Ventricular tachyarrhythmia ([place]) 07/03/2015  . Essential hypertension 07/03/2015    Past Surgical History:  Procedure Laterality Date  . CARDIAC DEFIBRILLATOR PLACEMENT  06/05/13    [employer] Inogen ICD implanted at [facility] in [place] for primary prevention  . HERNIA REPAIR    . PERCUTANEOUS CORONARY STENT INTERVENTION (PCI-S)  2011        Home Medications    Prior to Admission medications   Medication Sig Start Date End Date Taking? Authorizing Provider  allopurinol (ZYLOPRIM) 100 MG tablet Take 2 tablets (200 mg total) by mouth daily. 05/18/17   Gildardo Pounds, NP  atorvastatin (LIPITOR) 40 MG tablet Take 1 tablet (40 mg total) by mouth daily. 09/20/17   Gildardo Pounds, NP  busPIRone (BUSPAR) 10 MG tablet Take 1 tablet (10 mg total) by mouth 3 (three) times daily. Patient not taking: Reported on 09/20/2017 05/12/17   Gildardo Pounds, NP  carvedilol (COREG) 25 MG tablet Take 1 tablet (25 mg total) by mouth 2 (two) times daily with a meal. 01/18/17   Davonna Belling, MD  digoxin (LANOXIN) 0.125 MG tablet Take 1 tablet (0.125 mg total) by mouth daily. 04/12/17   Gildardo Pounds, NP  escitalopram (LEXAPRO) 20 MG tablet Take 1 tablet (20 mg total) by mouth daily. 09/20/17   Gildardo Pounds, NP  furosemide (LASIX) 80 MG tablet Take 1 tablet (80 mg total) by mouth daily. 04/12/17 07/11/17  Gildardo Pounds, NP  losartan (COZAAR) 50 MG tablet Take 1 tablet (  50 mg total) by mouth daily. 09/20/17   Gildardo Pounds, NP  nitroGLYCERIN (NITROSTAT) 0.4 MG SL tablet Place 1 tablet (0.4 mg total) under the tongue every 5 (five) minutes x 3 doses as needed for chest pain. 09/20/17   Gildardo Pounds, NP  omeprazole (PRILOSEC) 20 MG capsule Take 1 capsule (20 mg total) by mouth daily. Patient not taking: Reported on 09/20/2017 04/12/17   Gildardo Pounds, NP  potassium chloride SA (K-DUR,KLOR-CON) 20 MEQ tablet Take 1 tablet by mouth daily. 06/17/15   [provider]  traZODone (DESYREL) 150 MG tablet Take 1 tablet (150 mg total) by mouth at bedtime. MUST MAKE APPT FOR FURTHER REFILLS 01/20/18   Gildardo Pounds, NP    Family History Family History  Problem  Relation Age of Onset  . Cancer Mother        Pancreatic  . Hypertension Father   . Alzheimer's disease Father   . Gout Father   . Dementia Father   . Hypertension Sister   . Clotting disorder Sister   . Obesity Brother   . Bronchitis Brother   . Heart disease Maternal Grandmother   . Gout Paternal Grandfather     Social History Social History   Tobacco Use  . Smoking status: Never Smoker  . Smokeless tobacco: Never Used  Substance Use Topics  . Alcohol use: Not Currently    Alcohol/week: 0.0 standard drinks  . Drug use: No     Allergies   Apple; Lisinopril; Other; and Shellfish allergy   Review of Systems Review of Systems  Constitutional: Negative for appetite change, chills and fever.  HENT: Negative for ear pain, rhinorrhea, sneezing and sore throat.   Eyes: Negative for photophobia and visual disturbance.  Respiratory: Negative for cough, chest tightness, shortness of breath and wheezing.   Cardiovascular: Positive for chest pain. Negative for palpitations.  Gastrointestinal: Negative for abdominal pain, blood in stool, constipation, diarrhea, nausea and vomiting.  Genitourinary: Negative for dysuria, hematuria and urgency.  Musculoskeletal: Negative for myalgias.  Skin: Negative for rash.  Neurological: Negative for dizziness, weakness and light-headedness.     Physical Exam Updated Vital Signs BP (!) 131/92   Pulse 99   Temp 98.2 F (36.8 C) (Oral)   Resp 18   Ht 5\' 5"  (1.651 m)   Wt 71.2 kg   SpO2 98%   BMI 26.12 kg/m   Physical Exam Vitals signs and nursing note reviewed.  Constitutional:      General: He is not in acute distress.    Appearance: He is well-developed.  HENT:     Head: Normocephalic and atraumatic.     Nose: Nose normal.  Eyes:     General: No scleral icterus.       Right eye: No discharge.        Left eye: No discharge.     Conjunctiva/sclera: Conjunctivae normal.  Neck:     Musculoskeletal: Normal range of motion and  neck supple.  Cardiovascular:     Rate and Rhythm: Regular rhythm. Tachycardia present.     Heart sounds: Normal heart sounds. No murmur. No friction rub. No gallop.   Pulmonary:     Effort: Pulmonary effort is normal. No respiratory distress.     Breath sounds: Normal breath sounds.  Abdominal:     General: Bowel sounds are normal. There is no distension.     Palpations: Abdomen is soft.     Tenderness: There is no abdominal tenderness. There  is no guarding.  Musculoskeletal: Normal range of motion.     Comments: No lower extremity edema, erythema or calf tenderness bilaterally.  Skin:    General: Skin is warm and dry.     Findings: No rash.  Neurological:     Mental Status: He is alert.     Motor: No abnormal muscle tone.     Coordination: Coordination normal.      ED Treatments / Results  Labs (all labs ordered are listed, but only abnormal results are displayed) Labs Reviewed  COMPREHENSIVE METABOLIC PANEL - Abnormal; Notable for the following components:      Result Value   BUN 22 (*)    Creatinine, Ser 1.63 (*)    AST 58 (*)    ALT 55 (*)    GFR calc non Af Amer 47 (*)    GFR calc Af Amer 55 (*)    All other components within normal limits  CBC WITH DIFFERENTIAL/PLATELET - Abnormal; Notable for the following components:   MCV 100.6 (*)    All other components within normal limits  MAGNESIUM  I-STAT TROPONIN, ED    EKG EKG Interpretation  Date/Time:  Monday March 21 2018 10:48:47 EDT Ventricular Rate:  108 PR Interval:    QRS Duration: 117 QT Interval:  368 QTC Calculation: 494 R Axis:   13 Text Interpretation:  Sinus tachycardia Biatrial enlargement Incomplete left bundle branch block LVH with secondary repolarization abnormality Borderline prolonged QT interval new t wave changes laterally Confirmed by Isla Pence [phone]) on 03/21/2018 11:22:26 AM   Radiology Dg Chest 2 View  Result Date: 03/21/2018 CLINICAL DATA:  Chest pain, fever, shortness of  breath, defibrillator actuation EXAM: CHEST - 2 VIEW COMPARISON:  01/18/2017 FINDINGS: Cardiomegaly with left chest single lead pacer defibrillator. Both lungs are clear. The visualized skeletal structures are unremarkable. IMPRESSION: Cardiomegaly without acute abnormality of the lungs. Electronically Signed   By: Eddie Candle M.D.   On: 03/21/2018 11:34    Procedures Procedures (including critical care time)  Medications Ordered in ED Medications - No data to display   Initial Impression / Assessment and Plan / ED Course  I have reviewed the triage vital signs and the nursing notes.  Pertinent labs & imaging results that were available during my care of the patient were reviewed by me and considered in my medical decision making (see chart for details).        53 year old male with past medical history of CAD, CHF, hypertension, ICD in place presents to ED for ICD discharged 2 times which occurred 1 hour prior to initial evaluation.  States that he was laying in bed, became diaphoretic and felt his heart racing.  He felt his defibrillator from air and then fired again several minutes later.  He does admit that he has not followed up with cardiology in over 1 year due to insurance reasons.  He also notes he is somewhat noncompliant with his medications due to his depression.  He states that he has been taking his medications currently but is also requesting refill for all of his medications.  He notes a dull pain in his left chest but denies shortness of breath.  He is overall well-appearing on my examination.  Lungs are clear to auscultation bilaterally.  Chest x-ray shows cardiomegaly without acute findings.  EKG shows sinus tachycardia with new T wave changes laterally.  Troponin, magnesium, CBC, BMP unremarkable. ICD interrogated showing 23 vtach episodes (8 of which were nonsustained) since  this morning.  I have called cardiology who will evaluate the patient.  Resting comfortably at this  time with no complaints. Care handed off to [place]. Will dispo per cards recommendations.     Portions of this note were generated with [employer]. Dictation errors may occur despite best attempts at proofreading.   Final Clinical Impressions(s) / ED Diagnoses   Final diagnoses:  ICD (implantable cardioverter-defibrillator) discharge    ED Discharge Orders    None       Delia Heady, PA-C 03/21/18 1548    Isla Pence, MD 03/21/18 1601

## 2018-03-21 NOTE — ED Triage Notes (Signed)
Pt here via EMS with c/o ICD firing x2 this am.  Upon EMS arrival pt found to be sinus tach on the monitor at a rate of 110.  Pt denies any current pain, however noted left arm pain last night.    EMS vitals: BP 150/100 HR 110 RR 16 Sp02 98% on RA

## 2018-03-21 NOTE — H&P (Addendum)
Electrophysiology H&P Note   Patient ID: Stanley [name]; [id number]; 1965-11-06   Admit date: 03/21/2018 Date of Consult: 03/21/2018  Primary Care Provider: Gildardo Pounds, NP Primary Cardiologist: Thompson Grayer, MD (EP)  Chief Complaint: ICD fired  Patient Profile:   Stanley [name] is a 53 y.o. male with a hx of NICM in 2006 (felt 2/2 HTN/ETOH), ? CAD s/p PCI to unknown vessel 2011 at outside location (cath 2014 without CAD and normal coronaries by CT 07/2016) eventually a [employer] ICD implant done in [place] 2015, VT 2017, noncompliance, chronic chest pain of noncardiac source, depression w/ETOH abuse, HTN, gout who is being seen today for the evaluation of ICD shock at the request of Dr. Gilford Raid.  History of Present Illness:   History is noted above. In 2017 he received inappropriate therapy for ST with ATP (after a work out at [employer] and reported noncompliance with meds) that degenerated into VT and received several shocks. He has had issues with chronic chest pain without evidence for ischemic etiology including normal cath and cardiac CT as above. This is relatively unchanged. Last echo 03/2016 showed mid and basal inferior wall septal hypokinesis, moderately dilated LV, moderately reduced LV systolic function, EF 35-32%, grade 1 DD, mild MR, moderate LAE. He was last physically seen in the office in 08/2016 and has not returned since then but has been getting remote device checks that have been unremarkable. He cites financial difficulty as a huge barrier to care as well as his depression. He is on disability with fixed income and lives by himself. He states he cannot afford his medicine and sometimes goes a week without taking them. He continues to drink alcohol; based on his body language to the question I would question whether it is more than the 1-2 drinks daily that he reports drinking (shots of liquor). He states for the past 3 weeks he's been feeling more nauseated with sweating  and exertional dyspnea. His chronic chest pain is unchanged, seems to be there on a daily basis, not particularly worse with exertion. He reports exacerbation of these symptoms when he tries to cut down on alcohol. No orthopnea, abdominal swelling or lower extremity edema. He feels his weight is down from prior due to poor appetite, previously listed at 90.3kg in 09/2017 but now currently 71.2kg. He reports feeling his ICD fire twice this morning. Interrogation reveals numerous episodes of including 03/16/18 and today (03/21/18), most of which was not sustained but some outliers including 1-2 minutes. Today the duration was 6:45 and he received ATPx1, then 41J shock then 41J shock again.  Labs reveal K 3.8, Mg 1.9, BUN 22/Cr 1.63 (new AKI), mildly elevated LFTs, macrocytosis with normal Hgb 15.7 and negative troponin. His BP is mildly elevated and pulse is generally in the 90s. He is asking to go home.  Med rec is pending final review but tentatively he reports he has been intermittently taking allopurinol, Lasix, digoxin, lexapro, omeprazole, potassium and trazodone. He has not been taking losartan or buspar.  Past Medical History:  Diagnosis Date   Acute renal failure (ARF) ([facility])    CAD (coronary artery disease)    a. dx unclear, reported PCI in 2011 but cath 2014 normal coronaries and cardiac CT 07/2016 normal coronaries, no mention of stents.   CHF (congestive heart failure) ([facility])    Gout    Gouty arthritis    Hypertension    ICD (implantable cardioverter-defibrillator) in place    Insomnia  Nonischemic cardiomyopathy ([place])    Ventricular tachycardia ([place]) 01/2015   ATP delivered for sinus tach, degenerated into VT for which he received shock    Past Surgical History:  Procedure Laterality Date   CARDIAC DEFIBRILLATOR PLACEMENT  06/05/13   [employer] Inogen ICD implanted at [facility] in [place] for primary prevention   [place] (PCI-S)  2011     Inpatient Medications: Scheduled Meds:  Continuous Infusions:  PRN Meds:   Home Meds: Prior to Admission medications   Medication Sig Start Date End Date Taking? Authorizing Provider  allopurinol (ZYLOPRIM) 100 MG tablet Take 2 tablets (200 mg total) by mouth daily. 05/18/17   Gildardo Pounds, NP  atorvastatin (LIPITOR) 40 MG tablet Take 1 tablet (40 mg total) by mouth daily. 09/20/17   Gildardo Pounds, NP  busPIRone (BUSPAR) 10 MG tablet Take 1 tablet (10 mg total) by mouth 3 (three) times daily. Patient not taking: Reported on 09/20/2017 05/12/17   Gildardo Pounds, NP  carvedilol (COREG) 25 MG tablet Take 1 tablet (25 mg total) by mouth 2 (two) times daily with a meal. 01/18/17   Davonna Belling, MD  digoxin (LANOXIN) 0.125 MG tablet Take 1 tablet (0.125 mg total) by mouth daily. 04/12/17   Gildardo Pounds, NP  escitalopram (LEXAPRO) 20 MG tablet Take 1 tablet (20 mg total) by mouth daily. 09/20/17   Gildardo Pounds, NP  furosemide (LASIX) 80 MG tablet Take 1 tablet (80 mg total) by mouth daily. 04/12/17 07/11/17  Gildardo Pounds, NP  losartan (COZAAR) 50 MG tablet Take 1 tablet (50 mg total) by mouth daily. 09/20/17   Gildardo Pounds, NP  nitroGLYCERIN (NITROSTAT) 0.4 MG SL tablet Place 1 tablet (0.4 mg total) under the tongue every 5 (five) minutes x 3 doses as needed for chest pain. 09/20/17   Gildardo Pounds, NP  omeprazole (PRILOSEC) 20 MG capsule Take 1 capsule (20 mg total) by mouth daily. Patient not taking: Reported on 09/20/2017 04/12/17   Gildardo Pounds, NP  potassium chloride SA (K-DUR,KLOR-CON) 20 MEQ tablet Take 1 tablet by mouth daily. 06/17/15   [provider]  traZODone (DESYREL) 150 MG tablet Take 1 tablet (150 mg total) by mouth at bedtime. MUST MAKE APPT FOR FURTHER REFILLS 01/20/18   Gildardo Pounds, NP    Allergies:    Allergies  Allergen Reactions   Apple     unknown   Lisinopril     UNKNOWN   Other     Nuts -  unknown reaction   Shellfish Allergy     UNKNOWN    Social History:   Social History   Socioeconomic History   Marital status: Divorced    Spouse name: Not on file   Number of children: 1   Years of education: 12   Highest education level: Not on file  Occupational History   Occupation: Disability  Social Designer, fashion/clothing strain: Not on file   Food insecurity:    Worry: Not on file    Inability: Not on file   Transportation needs:    Medical: Not on file    Non-medical: Not on file  Tobacco Use   Smoking status: Never Smoker   Smokeless tobacco: Never Used  Substance and Sexual Activity   Alcohol use: Not Currently    Alcohol/week: 0.0 standard drinks   Drug use: No   Sexual activity:  Not Currently  Lifestyle   Physical activity:    Days per week: Not on file    Minutes per session: Not on file   Stress: Not on file  Relationships   Social connections:    Talks on phone: Not on file    Gets together: Not on file    Attends religious service: Not on file    Active member of club or organization: Not on file    Attends meetings of clubs or organizations: Not on file    Relationship status: Not on file   Intimate partner violence:    Fear of current or ex partner: Not on file    Emotionally abused: Not on file    Physically abused: Not on file    Forced sexual activity: Not on file  Other Topics Concern   Not on file  Social History Narrative   Lives in [place] alone.  Disabled.  Previously worked as a Careers adviser.   Fun: Rest, Read and watch movies.     Family History:   The patient's family history includes Alzheimer's disease in his father; Bronchitis in his brother; Cancer in his mother; Clotting disorder in his sister; Dementia in his father; Gout in his father and paternal grandfather; Heart disease in his maternal grandmother; Hypertension in his father and sister; Obesity in his brother.  ROS:  Please see the  history of present illness.  All other ROS reviewed and negative.     Physical Exam/Data:   Vitals:   03/21/18 1230 03/21/18 1330 03/21/18 1400 03/21/18 1430  BP: 125/84 (!) 135/96 120/85 (!) 131/92  Pulse: 87 86 90 99  Resp:  20  18  Temp:      TempSrc:      SpO2: 92% 93% 93% 98%  Weight:      Height:       No intake or output data in the 24 hours ending 03/21/18 1542 Last 3 Weights 03/21/2018 09/20/2017 05/12/2017  Weight (lbs) 156 lb 15 oz 199 lb 183 lb  Weight (kg) 71.186 kg 90.266 kg 83.008 kg    Body mass index is 26.12 kg/m.  General: Well developed, well nourished AAM, in no acute distress. Jovial. Head: Normocephalic, atraumatic, sclera non-icteric, no xanthomas, nares are without discharge.  Neck: Negative for carotid bruits. JVD not elevated. Lungs: Clear bilaterally to auscultation without wheezes, rales, or rhonchi. Breathing is unlabored. Heart: RRR with S1 S2. No murmurs, rubs, or gallops appreciated. Abdomen: Soft, non-tender, non-distended with normoactive bowel sounds. No hepatomegaly. No rebound/guarding. No obvious abdominal masses. Msk:  Strength and tone appear normal for age. Extremities: No clubbing or cyanosis. No edema.  Distal pedal pulses are 2+ and equal bilaterally. Neuro: Alert and oriented X 3. No facial asymmetry. No focal deficit. Moves all extremities spontaneously. Psych:  Responds to questions appropriately with a normal affect.  EKG:  The EKG was personally reviewed and demonstrates sinus tach biatrial enlargement, incomplete LBBB, LVH with repolarization abnormality, QTC 419ms  Laboratory Data:  Chemistry Recent Labs  Lab 03/21/18 1118  NA 140  K 3.8  CL 102  CO2 24  GLUCOSE 90  BUN 22*  CREATININE 1.63*  CALCIUM 9.0  GFRNONAA 47*  GFRAA 55*  ANIONGAP 14    Recent Labs  Lab 03/21/18 1118  PROT 8.0  ALBUMIN 4.0  AST 58*  ALT 55*  ALKPHOS 56  BILITOT 0.8   Hematology Recent Labs  Lab 03/21/18 1118  WBC 8.5  RBC 4.82  HGB 15.7  HCT 48.5  MCV 100.6*  MCH 32.6  MCHC 32.4  RDW 12.7  PLT 225   Cardiac EnzymesNo results for input(s): TROPONINI in the last 168 hours.  Recent Labs  Lab 03/21/18 1137  TROPIPOC 0.03    BNPNo results for input(s): BNP, PROBNP in the last 168 hours.  DDimer No results for input(s): DDIMER in the last 168 hours.  Radiology/Studies:  Dg Chest 2 View  Result Date: 03/21/2018 CLINICAL DATA:  Chest pain, fever, shortness of breath, defibrillator actuation EXAM: CHEST - 2 VIEW COMPARISON:  01/18/2017 FINDINGS: Cardiomegaly with left chest single lead pacer defibrillator. Both lungs are clear. The visualized skeletal structures are unremarkable. IMPRESSION: Cardiomegaly without acute abnormality of the lungs. Electronically Signed   By: Eddie Candle M.D.   On: 03/21/2018 11:34    Assessment and Plan:   1. Recurrence of VT s/p AICD shock x 2, but also listed occurrences of VT that did not require therapy - will give mag sulfate 1g x 1 and KCl 93meq x 1 to keep K 4.0 or greater and Mag 2.0 or greater. Noncompliance likely playing a role. Discussed importance of medication compliance. Also discussed mortality rate of untreated CHF. Will add digoxin level and UDS to labs. Per preliminary discussion with Dr. Rayann Heman - will plan to admit and resume home carvedilol. He will review med regimen and interrogation further to decide on antiarrhythmic therapy. Hold Lasix and Losartan until review of renal trajectory tomorrow. Hold digoxin for now pending observation of renal function. Consult care management for med resources.    2. NICM/chronic systolic CHF - he has been moreso class II-III lately, with exertional dyspnea. Clinically he appears fairly euvolemic. CXR unremarkable. Add BNP to labs. Will need attention to weight loss on OP f/u. I wonder if he would be a candidate for Entresto but would likely need to be seen in CHF Clinic to assist with cost and social situation. Apparently this was  on an old med list but he's since been on losartan although ran out recently. See above re: meds. Check echo. If EF significantly worse may need to consider whether any component of low output.  3. Acute kidney injury - ? related to recent poor appetite vs ICD shocks. See above. Consider renal US if persistent.  4. H/o ETOH abuse - counseled on importance of cessation.  5. Chronic chest pain - no recent change in this. Previously advised to see PCP to review. Troponin negative.   6. Elevated LFTs -  ? Alcohol related. Could also be hepatic congestopathy. Hold statin for now. Trend. Check hepatitis panel. If persists, consider abdominal US. Add CIWA protocol.  For questions or updates, please contact [place] Please consult [email or web address] for contact info under Cardiology/STEMI.   Signed, Charlie Pitter, PA-C  03/21/2018 3:42 PM  Pt with recurrent VT in the setting of medicine noncompliance.  We discussed at length today.  Cost is partially an issue.  We will therefore consult case management for assistance.  Also drinks ETOH heavily.  I have advised cessation.  Admit for overnight observation.  Start amiodarone 200mg  BID x 2 weeks, then 200mg  daily x 3 months, then discontinue if no further VT. Resume home medicines. Consider CHF clinic follow-up. No driving x 6 months with syncope  Hopefully to discharge tomorrow after seen by case management  Follow-up with me in the office in 4-6 weeks  Thompson Grayer MD, [place] 03/21/2018 9:35 PM

## 2018-03-21 NOTE — ED Notes (Signed)
Spoke with [employer], they will fax over results of the interrogation ASAP.

## 2018-03-21 NOTE — ED Notes (Signed)
Got patient undress on the monitor did ekg shown to Dr Gilford Raid patient is resting with nurse at bedside

## 2018-03-21 NOTE — ED Notes (Signed)
Patient transported to X-ray 

## 2018-03-21 NOTE — ED Notes (Signed)
ED Provider at bedside. 

## 2018-03-21 NOTE — ED Provider Notes (Signed)
Care signed out from [facility] [place] at shift change. See her note for full H&P.   Briefly, pt felt his ICD fire twice this morning PTA. At that time, he also felt diaphoretic and had palpitations. Since then he has had some dull pain to the left chest. No sob. Pt is somewhat noncompliant with his medications at home.   Physical Exam  BP (!) 145/97   Pulse 91   Temp 98.2 F (36.8 C) (Oral)   Resp (!) 23   Ht 5\' 5"  (1.651 m)   Wt 71.2 kg   SpO2 97%   BMI 26.12 kg/m   ED Course/Procedures     Procedures  Results for orders placed or performed during the hospital encounter of 03/21/18  Comprehensive metabolic panel  Result Value Ref Range   Sodium 140 135 - 145 mmol/L   Potassium 3.8 3.5 - 5.1 mmol/L   Chloride 102 98 - 111 mmol/L   CO2 24 22 - 32 mmol/L   Glucose, Bld 90 70 - 99 mg/dL   BUN 22 (H) 6 - 20 mg/dL   Creatinine, Ser 1.63 (H) 0.61 - 1.24 mg/dL   Calcium 9.0 8.9 - 10.3 mg/dL   Total Protein 8.0 6.5 - 8.1 g/dL   Albumin 4.0 3.5 - 5.0 g/dL   AST 58 (H) 15 - 41 U/L   ALT 55 (H) 0 - 44 U/L   Alkaline Phosphatase 56 38 - 126 U/L   Total Bilirubin 0.8 0.3 - 1.2 mg/dL   GFR calc non Af Amer 47 (L) >60 mL/min   GFR calc Af Amer 55 (L) >60 mL/min   Anion gap 14 5 - 15  CBC with Differential  Result Value Ref Range   WBC 8.5 4.0 - 10.5 K/uL   RBC 4.82 4.22 - 5.81 MIL/uL   Hemoglobin 15.7 13.0 - 17.0 g/dL   HCT 48.5 39.0 - 52.0 %   MCV 100.6 (H) 80.0 - 100.0 fL   MCH 32.6 26.0 - 34.0 pg   MCHC 32.4 30.0 - 36.0 g/dL   RDW 12.7 11.5 - 15.5 %   Platelets 225 150 - 400 K/uL   nRBC 0.0 0.0 - 0.2 %   Neutrophils Relative % 54 %   Neutro Abs 4.6 1.7 - 7.7 K/uL   Lymphocytes Relative 36 %   Lymphs Abs 3.1 0.7 - 4.0 K/uL   Monocytes Relative 7 %   Monocytes Absolute 0.6 0.1 - 1.0 K/uL   Eosinophils Relative 2 %   Eosinophils Absolute 0.2 0.0 - 0.5 K/uL   Basophils Relative 1 %   Basophils Absolute 0.1 0.0 - 0.1 K/uL   Immature Granulocytes 0 %   Abs Immature  Granulocytes 0.03 0.00 - 0.07 K/uL  Magnesium  Result Value Ref Range   Magnesium 1.9 1.7 - 2.4 mg/dL  Urine rapid drug screen (hosp performed)  Result Value Ref Range   Opiates NONE DETECTED NONE DETECTED   Cocaine NONE DETECTED NONE DETECTED   Benzodiazepines NONE DETECTED NONE DETECTED   Amphetamines NONE DETECTED NONE DETECTED   Tetrahydrocannabinol NONE DETECTED NONE DETECTED   Barbiturates NONE DETECTED NONE DETECTED  I-Stat Troponin, ED (not at [facility])  Result Value Ref Range   Troponin i, poc 0.03 0.00 - 0.08 ng/mL   Comment 3           Dg Chest 2 View  Result Date: 03/21/2018 CLINICAL DATA:  Chest pain, fever, shortness of breath, defibrillator actuation EXAM: CHEST - 2 VIEW  COMPARISON:  01/18/2017 FINDINGS: Cardiomegaly with left chest single lead pacer defibrillator. Both lungs are clear. The visualized skeletal structures are unremarkable. IMPRESSION: Cardiomegaly without acute abnormality of the lungs. Electronically Signed   By: Eddie Candle M.D.   On: 03/21/2018 11:34    EKG Interpretation  Date/Time:  Monday March 21 2018 10:48:47 EDT Ventricular Rate:  108 PR Interval:    QRS Duration: 117 QT Interval:  368 QTC Calculation: 494 R Axis:   13 Text Interpretation:  Sinus tachycardia Biatrial enlargement Incomplete left bundle branch block LVH with secondary repolarization abnormality Borderline prolonged QT interval new t wave changes laterally Confirmed by Isla Pence [phone]) on 03/21/2018 11:22:26 AM        MDM   Briefly, pt felt his ICD fire twice this morning PTA. Since then he has had some dull pain to the left chest. No sob.   CBC WNL CMP with elevated BUN and creatinine compared to prior at 22 and 1.63 respectively. AST and ALT are slightly elevated at 58 and 55 respectively which is new. No elevated anion gap.  Troponin is negative.  MG is WNL  CXR with cardiomegaly without evidence of PNA, PTX or other abnormality.   EKG with Sinus tachycardia  Biatrial enlargement Incomplete left bundle branch block LVH with secondary repolarization abnormality Borderline prolonged QT interval new t wave changes laterally.  Interrogation of AICD shows several runs of VT this AM.   At sign out, cardiology has been consulted and the plan is to f/u on cardiology recommendations.   Cardiology evaluated the pt and will admit the patient to their service.     Bishop Dublin 03/21/18 1754    Dorie Rank, MD 03/22/18 1539

## 2018-03-21 NOTE — ED Notes (Signed)
Cardiology at bedside.

## 2018-03-21 NOTE — Plan of Care (Signed)

## 2018-03-21 NOTE — ED Notes (Signed)
ED TO INPATIENT HANDOFF REPORT  ED Nurse Name and Phone #: cindy [phone]  S Name/Age/Gender Stanley [name] 53 y.o. male Room/Bed: 026C/026C  Code Status   Code Status: Not on file  Home/SNF/Other Home Patient oriented to: self, place, time and situation Is this baseline? Yes   Triage Complete: Triage complete  Chief Complaint Defib fired  Triage Note Pt here via EMS with c/o ICD firing x2 this am.  Upon EMS arrival pt found to be sinus tach on the monitor at a rate of 110.  Pt denies any current pain, however noted left arm pain last night.    EMS vitals: BP 150/100 HR 110 RR 16 Sp02 98% on RA   Allergies Allergies  Allergen Reactions  . Lisinopril Shortness Of Breath  . Apple Swelling    Lip Swelling  . Other Swelling    Nuts - Lip Swelling  . Shellfish Allergy Swelling    Lip Swelling    Level of Care/Admitting Diagnosis ED Disposition    ED Disposition Condition Comment   Admit  Hospital Area: [facility] [[id number]]  Level of Care: Telemetry Cardiac [103]  Diagnosis: Ventricular tachycardia [facility]) [454098]  Admitting Physician: Thompson Grayer [[id number]]  Attending Physician: Thompson Grayer [[id number]]  PT Class (Do Not Modify): Observation [104]  PT Acc Code (Do Not Modify): Observation [10022]       B Medical/Surgery History Past Medical History:  Diagnosis Date  . Acute renal failure (ARF) ([place])   . CAD (coronary artery disease)    a. dx unclear, reported PCI in 2011 but cath 2014 normal coronaries and cardiac CT 07/2016 normal coronaries, no mention of stents.  . CHF (congestive heart failure) ([place])   . Gout   . Gouty arthritis   . Hypertension   . ICD (implantable cardioverter-defibrillator) in place   . Insomnia   . Nonischemic cardiomyopathy ([place])   . Ventricular tachycardia ([place]) 01/2015   ATP delivered for sinus tach, degenerated into VT for which he received shock   Past Surgical History:  Procedure Laterality Date  .  CARDIAC DEFIBRILLATOR PLACEMENT  06/05/13   [employer] Inogen ICD implanted at [facility] in [place] for primary prevention  . HERNIA REPAIR    . PERCUTANEOUS CORONARY STENT INTERVENTION (PCI-S)  2011     A IV Location/Drains/Wounds Patient Lines/Drains/Airways Status   Active Line/Drains/Airways    Name:   Placement date:   Placement time:   Site:   Days:   Peripheral IV 03/21/18 Left Hand   03/21/18    1053    Hand   less than 1          Intake/Output Last 24 hours  Intake/Output Summary (Last 24 hours) at 03/21/2018 1818 Last data filed at 03/21/2018 1621 Gross per 24 hour  Intake -  Output 800 ml  Net -800 ml    Labs/Imaging Results for orders placed or performed during the hospital encounter of 03/21/18 (from the past 48 hour(s))  Comprehensive metabolic panel     Status: Abnormal   Collection Time: 03/21/18 11:18 AM  Result Value Ref Range   Sodium 140 135 - 145 mmol/L   Potassium 3.8 3.5 - 5.1 mmol/L   Chloride 102 98 - 111 mmol/L   CO2 24 22 - 32 mmol/L   Glucose, Bld 90 70 - 99 mg/dL   BUN 22 (H) 6 - 20 mg/dL   Creatinine, Ser 1.63 (H) 0.61 - 1.24 mg/dL   Calcium 9.0  8.9 - 10.3 mg/dL   Total Protein 8.0 6.5 - 8.1 g/dL   Albumin 4.0 3.5 - 5.0 g/dL   AST 58 (H) 15 - 41 U/L   ALT 55 (H) 0 - 44 U/L   Alkaline Phosphatase 56 38 - 126 U/L   Total Bilirubin 0.8 0.3 - 1.2 mg/dL   GFR calc non Af Amer 47 (L) >60 mL/min   GFR calc Af Amer 55 (L) >60 mL/min   Anion gap 14 5 - 15    Comment: Performed at [facility] [address], [place], [place] [address]  CBC with Differential     Status: Abnormal   Collection Time: 03/21/18 11:18 AM  Result Value Ref Range   WBC 8.5 4.0 - 10.5 K/uL   RBC 4.82 4.22 - 5.81 MIL/uL   Hemoglobin 15.7 13.0 - 17.0 g/dL   HCT 48.5 39.0 - 52.0 %   MCV 100.6 (H) 80.0 - 100.0 fL   MCH 32.6 26.0 - 34.0 pg   MCHC 32.4 30.0 - 36.0 g/dL   RDW 12.7 11.5 - 15.5 %   Platelets 225 150 - 400 K/uL   nRBC 0.0 0.0 - 0.2  %   Neutrophils Relative % 54 %   Neutro Abs 4.6 1.7 - 7.7 K/uL   Lymphocytes Relative 36 %   Lymphs Abs 3.1 0.7 - 4.0 K/uL   Monocytes Relative 7 %   Monocytes Absolute 0.6 0.1 - 1.0 K/uL   Eosinophils Relative 2 %   Eosinophils Absolute 0.2 0.0 - 0.5 K/uL   Basophils Relative 1 %   Basophils Absolute 0.1 0.0 - 0.1 K/uL   Immature Granulocytes 0 %   Abs Immature Granulocytes 0.03 0.00 - 0.07 K/uL    Comment: Performed at [facility] [address] [place], [place] [address]  Magnesium     Status: None   Collection Time: 03/21/18 11:18 AM  Result Value Ref Range   Magnesium 1.9 1.7 - 2.4 mg/dL    Comment: Performed at [facility] [address], [place], [place] [address]  I-Stat Troponin, ED (not at [facility])     Status: None   Collection Time: 03/21/18 11:37 AM  Result Value Ref Range   Troponin i, poc 0.03 0.00 - 0.08 ng/mL   Comment 3            Comment: Due to the release kinetics of cTnI, a negative result within the first hours of the onset of symptoms does not rule out myocardial infarction with certainty. If myocardial infarction is still suspected, repeat the test at appropriate intervals.   Urine rapid drug screen (hosp performed)     Status: None   Collection Time: 03/21/18  4:11 PM  Result Value Ref Range   Opiates NONE DETECTED NONE DETECTED   Cocaine NONE DETECTED NONE DETECTED   Benzodiazepines NONE DETECTED NONE DETECTED   Amphetamines NONE DETECTED NONE DETECTED   Tetrahydrocannabinol NONE DETECTED NONE DETECTED   Barbiturates NONE DETECTED NONE DETECTED    Comment: (NOTE) DRUG SCREEN FOR MEDICAL PURPOSES ONLY.  IF CONFIRMATION IS NEEDED FOR ANY PURPOSE, NOTIFY LAB WITHIN 5 DAYS. LOWEST DETECTABLE LIMITS FOR URINE DRUG SCREEN Drug Class                     Cutoff (ng/mL) Amphetamine and metabolites    1000 Barbiturate and metabolites    200 Benzodiazepine  034 Tricyclics and metabolites     300 Opiates and metabolites         300 Cocaine and metabolites        300 THC                            50 Performed at [facility], [place] [address], [place], [place] [address]   Brain natriuretic peptide     Status: None   Collection Time: 03/21/18  5:00 PM  Result Value Ref Range   B Natriuretic Peptide 33.7 0.0 - 100.0 pg/mL    Comment: Performed at [facility] [address], [place], [place] [address]   Dg Chest 2 View  Result Date: 03/21/2018 CLINICAL DATA:  Chest pain, fever, shortness of breath, defibrillator actuation EXAM: CHEST - 2 VIEW COMPARISON:  01/18/2017 FINDINGS: Cardiomegaly with left chest single lead pacer defibrillator. Both lungs are clear. The visualized skeletal structures are unremarkable. IMPRESSION: Cardiomegaly without acute abnormality of the lungs. Electronically Signed   By: Eddie Candle M.D.   On: 03/21/2018 11:34    Pending Labs Unresulted Labs (From admission, onward)    Start     Ordered   03/21/18 1731  Hepatitis panel, acute  Once,   R     03/21/18 1730   03/21/18 1657  TSH  Once,   R     03/21/18 1656   03/21/18 1657  T4, free  Once,   R     03/21/18 1656   03/21/18 1539  Digoxin level  ONCE - STAT,   R     03/21/18 1538   Signed and Held  HIV antibody (Routine Testing)  Once,   R     Signed and Held   Signed and Held  Basic metabolic panel  Daily,   R     Signed and Held   Signed and Held  Hepatic function panel  Tomorrow morning,   R     Signed and Held   Signed and Held  Magnesium  Tomorrow morning,   R     Signed and Held          Vitals/Pain Today's Vitals   03/21/18 1500 03/21/18 1600 03/21/18 1700 03/21/18 1727  BP: (!) 141/92 (!) 145/86 (!) 145/97   Pulse: 87 91 91   Resp:  (!) 21 (!) 23   Temp:      TempSrc:      SpO2: 95% 99% 97%   Weight:      Height:      PainSc:    0-No pain    Isolation Precautions No active isolations  Medications Medications  magnesium sulfate IVPB 1 g 100 mL (1 g Intravenous New Bag/Given 03/21/18  1725)  potassium chloride SA (K-DUR,KLOR-CON) CR tablet 20 mEq (20 mEq Oral Given 03/21/18 1620)    Mobility walks Moderate fall risk   Focused Assessments Cardiac Assessment Handoff:  Cardiac Rhythm: Sinus tachycardia No results found for: CKTOTAL, CKMB, CKMBINDEX, TROPONINI No results found for: DDIMER Does the Patient currently have chest pain? No      R Recommendations: See Admitting Provider Note  Report given to:

## 2018-03-22 ENCOUNTER — Observation Stay (HOSPITAL_BASED_OUTPATIENT_CLINIC_OR_DEPARTMENT_OTHER): Payer: Medicaid Other

## 2018-03-22 DIAGNOSIS — I361 Nonrheumatic tricuspid (valve) insufficiency: Secondary | ICD-10-CM

## 2018-03-22 DIAGNOSIS — Z9119 Patient's noncompliance with other medical treatment and regimen: Secondary | ICD-10-CM

## 2018-03-22 LAB — MAGNESIUM: Magnesium: 2.1 mg/dL (ref 1.7–2.4)

## 2018-03-22 LAB — BASIC METABOLIC PANEL
Anion gap: 12 (ref 5–15)
BUN: 21 mg/dL — ABNORMAL HIGH (ref 6–20)
CO2: 27 mmol/L (ref 22–32)
Calcium: 9 mg/dL (ref 8.9–10.3)
Chloride: 97 mmol/L — ABNORMAL LOW (ref 98–111)
Creatinine, Ser: 1.32 mg/dL — ABNORMAL HIGH (ref 0.61–1.24)
Glucose, Bld: 75 mg/dL (ref 70–99)
Potassium: 3.7 mmol/L (ref 3.5–5.1)
SODIUM: 136 mmol/L (ref 135–145)

## 2018-03-22 LAB — ECHOCARDIOGRAM COMPLETE
HEIGHTINCHES: 65 in
Weight: 2828.8 oz

## 2018-03-22 LAB — HEPATIC FUNCTION PANEL
ALT: 54 U/L — ABNORMAL HIGH (ref 0–44)
AST: 56 U/L — AB (ref 15–41)
Albumin: 3.8 g/dL (ref 3.5–5.0)
Alkaline Phosphatase: 57 U/L (ref 38–126)
Bilirubin, Direct: 0.4 mg/dL — ABNORMAL HIGH (ref 0.0–0.2)
Indirect Bilirubin: 1.6 mg/dL — ABNORMAL HIGH (ref 0.3–0.9)
Total Bilirubin: 2 mg/dL — ABNORMAL HIGH (ref 0.3–1.2)
Total Protein: 7.7 g/dL (ref 6.5–8.1)

## 2018-03-22 LAB — HIV ANTIBODY (ROUTINE TESTING W REFLEX): HIV Screen 4th Generation wRfx: NONREACTIVE

## 2018-03-22 MED ORDER — PERFLUTREN LIPID MICROSPHERE
1.0000 mL | INTRAVENOUS | Status: AC | PRN
  Administered 2018-03-22: 3 mL via INTRAVENOUS
  Filled 2018-03-22: qty 10

## 2018-03-22 MED ORDER — CARVEDILOL 25 MG PO TABS: 25.0000 mg | ORAL_TABLET | Freq: Two times a day (BID) | ORAL | 6 refills | Status: DC

## 2018-03-22 MED ORDER — NITROGLYCERIN 0.4 MG SL SUBL: 0.4000 mg | SUBLINGUAL_TABLET | SUBLINGUAL | 6 refills | Status: DC | PRN

## 2018-03-22 MED ORDER — DIGOXIN 125 MCG PO TABS: 0.1250 mg | ORAL_TABLET | Freq: Every day | ORAL | 6 refills | Status: DC

## 2018-03-22 MED ORDER — FUROSEMIDE 80 MG PO TABS: 80.0000 mg | ORAL_TABLET | Freq: Every day | ORAL | 6 refills | Status: DC

## 2018-03-22 MED ORDER — AMIODARONE HCL 200 MG PO TABS: 200.0000 mg | ORAL_TABLET | Freq: Every day | ORAL | Status: DC

## 2018-03-22 MED ORDER — ATORVASTATIN CALCIUM 40 MG PO TABS: 40.0000 mg | ORAL_TABLET | Freq: Every day | ORAL | 6 refills | Status: DC

## 2018-03-22 MED ORDER — AMIODARONE HCL 200 MG PO TABS: 200.0000 mg | ORAL_TABLET | Freq: Every day | ORAL | 3 refills | Status: DC

## 2018-03-22 MED ORDER — LOSARTAN POTASSIUM 50 MG PO TABS: 50.0000 mg | ORAL_TABLET | Freq: Every day | ORAL | 6 refills | Status: DC

## 2018-03-22 MED ORDER — POTASSIUM CHLORIDE CRYS ER 20 MEQ PO TBCR: 20.0000 meq | EXTENDED_RELEASE_TABLET | Freq: Every day | ORAL | 6 refills | Status: DC

## 2018-03-22 MED FILL — POTASSIUM CL ER 20 MEQ TABL: 20 | 30 days supply | Qty: 30 | Fill #0 | Status: TO

## 2018-03-22 MED FILL — ATORVASTATIN CALCIUM 40 MG: 40 | 30 days supply | Qty: 30 | Fill #0 | Status: TO

## 2018-03-22 MED FILL — LOSARTAN POTASSIUM 50 MG TA: 50 | 30 days supply | Qty: 30 | Fill #0 | Status: TO

## 2018-03-22 MED FILL — AMIODARONE HCL 200 MG TAB: 200 | 30 days supply | Qty: 42 | Fill #0 | Status: TO

## 2018-03-22 MED FILL — FUROSEMIDE 80 MG TAB: 80 | 30 days supply | Qty: 30 | Fill #0 | Status: TO

## 2018-03-22 MED FILL — NITROGLYCERIN 0.4 MG TAB SL: 0.4 | 7 days supply | Qty: 25 | Fill #0 | Status: TO

## 2018-03-22 NOTE — Discharge Summary (Addendum)
DISCHARGE SUMMARY    Patient ID: Stanley [name],  MRN: [id number], DOB/AGE: 02/05/1965 53 y.o.  Admit date: 03/21/2018 Discharge date: 03/22/2018  Primary Care Physician: Stanley Pounds, NP  Primary Cardiologist/Electrophysiologist: Dr. Rayann Heman  Primary Discharge Diagnosis:  1. VT, ICD therapy 2. Medication non-compliance  Secondary Discharge Diagnosis:  1. NICM 2. ETOH abuse  Allergies  Allergen Reactions  . Lisinopril Shortness Of Breath  . Apple Swelling    Lip Swelling  . Other Swelling    Nuts - Lip Swelling  . Shellfish Allergy Swelling    Lip Swelling     Procedures This Admission:  none   Brief HPI: Stanley [name] is a 53 y.o. male with a hx of NICM in 2006 (felt 2/2 HTN/ETOH), ? CAD s/p PCI to unknown vessel 2011 at outside location (cath 2014 without CAD and normal coronaries by CT 07/2016) eventually a [employer] ICD implant done in [place] 2015, VT 2017, noncompliance, chronic chest pain of noncardiac source, depression w/ETOH abuse, HTN, gout was admitted 2/2 VT/ICD shocks in the environment of missed home medicines.   Hospital Course:  The patient reported feeling his ICD fire twice yesterday morning. Interrogation revealed numerous episodes of including 03/16/18 and today (03/21/18), most of which was not sustained but some outliers including 1-2 minutes. Today the duration was 6:45 and he received ATPx1, then 41J shock then 41J shock again.  He admitted to having at times missing as long as a week without his medicines here and there.   He had AKI, creat trending down today, mild elevation in his LFTs, most likely 2/2 ETOH,  TSH and dig level were OK.  he was not fluid OL by exam, symptoms or cxr.  He has chronic unchanged CP, w/u has not revealed any CAD historically.  He was monitored on telemetry overnight which demonstrated SR, no VT.  He has been seen and examined by Dr. Rayann Heman and felt stable to discharge to home.   ETOH cessation was urged, as  well as medicine compliance.  He was receptive. Amiodarone is planned to be short term, early EP follow up is in place, will plan to f/u labs. An echo has been done, will follow up results at his office visit  The patient was made aware of [place] law, no driving for 6 months  Case management has seen the patient.  We will write his medicines for [facility] pharmacy for his initial fill here and going forward will continue with [facility] where his medicines should be much more affordable for him.  He reports he has his allopurinol, omeprazole, Buspar, lexapro and trazadone at home, he has an appt in place with the wellness center via case management for these refills.    Physical Exam: Vitals:   03/21/18 1846 03/21/18 2108 03/22/18 0350 03/22/18 0731  BP: 139/90 (!) 149/93 (!) 149/97 (!) 138/99  Pulse: 90 96 94 96  Resp: 17 19 17 20   Temp: 98.9 F (37.2 C) 98.3 F (36.8 C) 98.1 F (36.7 C)   TempSrc: Oral Oral Oral   SpO2: 97% 93% 99%   Weight: 71.2 kg  80.2 kg   Height: 5\' 5"  (1.651 m)       GEN- The patient is well appearing, alert and oriented x 3 today.   HEENT: normocephalic, atraumatic; sclera clear, conjunctiva pink; hearing intact; oropharynx clear Lungs- CTA b/l, normal work of breathing.  No wheezes, rales, rhonchi Heart- RRR, no murmurs, rubs  or gallops, PMI not laterally displaced GI- soft, non-tender, non-distended Extremities- no clubbing, cyanosis, or edema MS- no significant deformity or atrophy Skin- warm and dry, no rash or lesion Psych- euthymic mood, full affect Neuro- no gross defecits  Labs:   Lab Results  Component Value Date   WBC 8.5 03/21/2018   HGB 15.7 03/21/2018   HCT 48.5 03/21/2018   MCV 100.6 (H) 03/21/2018   PLT 225 03/21/2018    Recent Labs  Lab 03/22/18 0336  NA 136  K 3.7  CL 97*  CO2 27  BUN 21*  CREATININE 1.32*  CALCIUM 9.0  PROT 7.7  BILITOT 2.0*  ALKPHOS 57  ALT 54*  AST 56*  GLUCOSE 75     Discharge Medications:  Allergies as of 03/22/2018      Reactions   Lisinopril Shortness Of Breath   Apple Swelling   Lip Swelling   Other Swelling   Nuts - Lip Swelling   Shellfish Allergy Swelling   Lip Swelling      Medication List    TAKE these medications   allopurinol 100 MG tablet Commonly known as:  ZYLOPRIM Take 2 tablets (200 mg total) by mouth daily.   amiodarone 200 MG tablet Commonly known as:  Pacerone Take 1 tablet (200 mg total) by mouth daily. START by taking 200mg  twice daily for 2 weeks then reduce to once daily   atorvastatin 40 MG tablet Commonly known as:  LIPITOR Take 1 tablet (40 mg total) by mouth daily.   bismuth subsalicylate 161 WR/60AV suspension Commonly known as:  PEPTO BISMOL Take 30 mLs by mouth every 6 (six) hours as needed for indigestion.   busPIRone 10 MG tablet Commonly known as:  BUSPAR Take 1 tablet (10 mg total) by mouth 3 (three) times daily. What changed:  when to take this   carvedilol 25 MG tablet Commonly known as:  COREG Take 1 tablet (25 mg total) by mouth 2 (two) times daily with a meal.   digoxin 0.125 MG tablet Commonly known as:  LANOXIN Take 1 tablet (0.125 mg total) by mouth daily.   escitalopram 20 MG tablet Commonly known as:  Lexapro Take 1 tablet (20 mg total) by mouth daily.   furosemide 80 MG tablet Commonly known as:  LASIX Take 1 tablet (80 mg total) by mouth daily.   ibuprofen 200 MG tablet Commonly known as:  ADVIL,MOTRIN Take 200 mg by mouth every 6 (six) hours as needed (gout).   losartan 50 MG tablet Commonly known as:  COZAAR Take 1 tablet (50 mg total) by mouth daily.   nitroGLYCERIN 0.4 MG SL tablet Commonly known as:  NITROSTAT Place 1 tablet (0.4 mg total) under the tongue every 5 (five) minutes x 3 doses as needed for chest pain.   omeprazole 20 MG capsule Commonly known as:  PRILOSEC Take 1 capsule (20 mg total) by mouth daily.   potassium chloride SA 20 MEQ tablet Commonly  known as:  K-DUR,KLOR-CON Take 1 tablet (20 mEq total) by mouth daily.   traZODone 150 MG tablet Commonly known as:  DESYREL Take 1 tablet (150 mg total) by mouth at bedtime. MUST MAKE APPT FOR FURTHER REFILLS       Disposition: Home Discharge Instructions    Diet - low sodium heart healthy   Complete by:  As directed    Increase activity slowly   Complete by:  As directed      Follow-up Information    Argentina Donovan,  PA-C Follow up on 03/31/2018.   Specialty:  Family Medicine Why:  @ 3:30 for hospital follow up appointment. If you can not make this scheduled appointment please call the office to reschedule. Pt will need to start utilizing the [facility] for [facility].  Contact information: [place] [address] [phone]        Thompson Grayer, MD Follow up.   Specialty:  Cardiology Why:  04/22/2018 @ 8:45AM Contact information: [address]  [place] [address] [phone]        [facility] Follow up.   Specialty:  Cardiology Why:  You will be called to schedule an appointment with our heart failure clinic as we discussed Contact information: [address] [phone] [place] [phone]          Duration of Discharge Encounter: Greater than 30 minutes including physician time.  Signed, Tommye Standard, PA-C 03/22/2018 12:26 PM  I have seen, examined the patient, and reviewed the above assessment and plan.  Changes to above are made where necessary.  On exam, RRR.  Doing well with amiodarone.  Importance of compliance with medicines was discussed with him today. Will need close outpatient follow-up as able with COVID 19 precautions.  Co Sign: Thompson Grayer, MD 03/22/2018 8:52 PM

## 2018-03-22 NOTE — Discharge Instructions (Signed)
NO DRIVING 6 MONTHS 

## 2018-03-22 NOTE — Progress Notes (Signed)
He has done well overnight, without further VT.  Anticipate discharge on amiodarone 200mg  BID x 2 weeks then 200mg  daily x 3 months.  Awaiting case management to assist with home medicine costs. Compliance with medicine encouraged  DC to home today Follow-up with me in 4-6 weeks  Thompson Grayer MD, [place] 03/22/2018 6:27 AM

## 2018-03-22 NOTE — TOC Initial Note (Signed)
Transition of Care [facility]) - Initial/Assessment Note    Patient Details  Name: Stanley [name] MRN: [id number] Date of Birth: 1965-06-10  Transition of [facility]) CM/SW Contact:    Bethena Roys, RN Phone Number: 03/22/2018, 12:09 PM  Clinical Narrative:  Pt presented for  recurrent VT. PTA from home alone and has support of daughter and son. Patient stating that he is having issues with cost of medications. Patient usually goes to [facility] and is not able to afford medications. Patient has Part A insurance only. CM did schedule hospital follow up appointment for patient at [facility]. Patient will utilize the [facility] for medications since only having Part A Rx coverage. Plan to send medications to the [facility]. Medications will be delivered to bedside prior to transition home. No further needs from CM at this time.                 Expected Discharge Plan: Home/Self Care Barriers to Discharge: No Barriers Identified   Patient Goals and CMS Choice Patient states their goals for this hospitalization and ongoing recovery are:: "to be able to get medications" CMS Medicare.gov Compare Post Acute Care list provided to:: (N/A) Choice offered to / list presented to : (N/A)  Expected Discharge Plan and Services Expected Discharge Plan: Home/Self Care Discharge Planning Services: CM Consult, [facility], Medication Assistance, Follow-up appt scheduled Post Acute Care Choice: (N/A) Living arrangements for the past 2 months: Apartment Expected Discharge Date: 03/23/18               DME Arranged: N/A DME Agency: (N/A) HH Arranged: NA HH Agency: NA  Prior Living Arrangements/Services Living arrangements for the past 2 months: Apartment Lives with:: Self Patient language and need for interpreter reviewed:: No Do you feel safe going back to the place where you live?: Yes      Need for Family Participation in Patient Care: No  (Comment) Care giver support system in place?: No (comment)   Criminal Activity/Legal Involvement Pertinent to Current Situation/Hospitalization: No - Comment as needed  Activities of Daily Living Home Assistive Devices/Equipment: None ADL Screening (condition at time of admission) Patient's cognitive ability adequate to safely complete daily activities?: Yes Is the patient deaf or have difficulty hearing?: No Does the patient have difficulty seeing, even when wearing glasses/contacts?: No Does the patient have difficulty concentrating, remembering, or making decisions?: No Patient able to express need for assistance with ADLs?: Yes Does the patient have difficulty dressing or bathing?: No Independently performs ADLs?: Yes (appropriate for developmental age) Does the patient have difficulty walking or climbing stairs?: No Weakness of Legs: None Weakness of Arms/Hands: None  Permission Sought/Granted Permission sought to share information with : Case Manager                Emotional Assessment Appearance:: Appears stated age Attitude/Demeanor/Rapport: Engaged Affect (typically observed): Accepting Orientation: : Oriented to Self, Oriented to Place, Oriented to  Time, Oriented to Situation      Admission diagnosis:  ICD (implantable cardioverter-defibrillator) discharge [Z45.02] Patient Active Problem List   Diagnosis Date Noted  . Implantable cardioverter-defibrillator (ICD) in situ 03/21/2018  . ETOH abuse 03/21/2018  . Chronic chest pain 03/21/2018  . Abnormal transaminases 03/21/2018  . AKI (acute kidney injury) ([place]) 03/21/2018  . History of noncompliance with medical treatment, presenting hazards to health 03/21/2018  . Ventricular tachycardia ([place]) 03/21/2018  . Gout 09/25/2015  . Cervical paraspinal muscle spasm 09/25/2015  .  Nonischemic cardiomyopathy ([place]) 07/03/2015  . Chronic systolic dysfunction of left ventricle 07/03/2015  . VT (ventricular tachycardia)  ([place]) 07/03/2015  . Essential hypertension 07/03/2015   PCP:  Gildardo Pounds, NP Pharmacy:   [facility] [place], [place] - [address]  [address] Phone: [phone] Fax: [phone]  [facility] [place], [place] - [address] [place] [address] Phone: [phone] Fax: [phone]     Social Determinants of Health (SDOH) Interventions    Readmission Risk Interventions  No flowsheet data found.

## 2018-03-22 NOTE — Progress Notes (Signed)
  Echocardiogram 2D Echocardiogram has been performed.  Stanley [name] 03/22/2018, 11:48 AM

## 2018-03-23 LAB — HEPATITIS PANEL, ACUTE
HCV Ab: <0.1 s/co ratio (ref 0.0–0.9)
Hep A IgM: NEGATIVE
Hep B C IgM: NEGATIVE
Hepatitis B Surface Ag: NEGATIVE

## 2018-03-30 NOTE — Progress Notes (Signed)
Virtual Visit via Telephone Note  I connected with Stanley [name] on 03/31/18 at  3:30 PM EDT by telephone and verified that I am speaking with the correct person using two identifiers.   I discussed the limitations, risks, security and privacy concerns of performing an evaluation and management service by telephone and the availability of in person appointments. I also discussed with the patient that there may be a patient responsible charge related to this service. The patient expressed understanding and agreed to proceed.   History of Present Illness: Patient ID: Stanley [name], male   DOB: August 05, 1965, 53 y.o.   MRN: [id number]   After hospitalization 3/16-3/17/2020.  He was unwilling to come into the office today due to Covid restrictions, but he was willing to do a phone visit. I am in my physical office.  He is not sleeping well and needs trazadone RF.  Also needs his allopurinol RF.  No CP/SOB/palpitations.  He is aware of his cardiology appt on 04/22/2018.  Compliant with all of his meds now.  Says he is no longer drinking alcohol.  Patient is at home as we speak.    From discharge summary: Primary Discharge Diagnosis:  1. VT, ICD therapy 2. Medication non-compliance  Secondary Discharge Diagnosis:  1. NICM 2. ETOH abuse  Brief HPI: [name] is a 53 y.o. male with a hx of NICM in 2006 (felt 2/2 HTN/ETOH), ? CAD s/p PCI to unknown vessel 2011 at outside location (cath 2014 without CAD and normal coronaries by CT 07/2016)eventually aBoston SciICD implant done in [place] 2015, VT 2017,noncompliance, chronic chest pain of noncardiac source,depression w/ETOH abuse, HTN, gout was admitted 2/2 VT/ICD shocks in the environment of missed home medicines.   Hospital Course:  The patient reported feeling his ICD fire twice yesterday morning. Interrogation revealed numerous episodes of including 03/16/18 and today (03/21/18), most of which was not sustained but some outliers  including 1-2 minutes. Today the duration was 6:45 and he received ATPx1, then 41J shock then 41J shock again.  He admitted to having at times missing as long as a week without his medicines here and there.   He had AKI, creat trending down today, mild elevation in his LFTs, most likely 2/2 ETOH,  TSH and dig level were OK.  he was not fluid OL by exam, symptoms or cxr.  He has chronic unchanged CP, w/u has not revealed any CAD historically.  He was monitored on telemetry overnight which demonstrated SR, no VT.  He has been seen and examined by Dr. Rayann Heman and felt stable to discharge to home.   ETOH cessation was urged, as well as medicine compliance.  He was receptive. Amiodarone is planned to be short term, early EP follow up is in place, will plan to f/u labs. An echo has been done, will follow up results at his office visit  The patient was made aware of [place] law, no driving for 6 months  Case management has seen the patient.  We will write his medicines for [facility] pharmacy for his initial fill here and going forward will continue with [facility] where his medicines should be much more affordable for him.  He reports he has his allopurinol, omeprazole, Buspar, lexapro and trazadone at home, he has an appt in place with the wellness center via case management for these refills.     Observations/Objective:  TP linear.  Upbeat mood.  Asks/answers questions appropriately   Assessment and Plan 1. VT (ventricular  tachycardia) (HCC) Resolved, continue amiodarone and f/up with cardiology as planned.  Defer labs to cardiology to limit office visits/Covid esposure  2. Psychophysiological insomnia Stable with meds - traZODone (DESYREL) 150 MG tablet; Take 1 tablet (150 mg total) by mouth at bedtime.  Dispense: 30 tablet; Refill: 3  3. Chronic gout of multiple sites, unspecified cause - allopurinol (ZYLOPRIM) 100 MG tablet; Take 2 tablets (200 mg total) by mouth daily.   Dispense: 60 tablet; Refill: 3  4. Essential hypertension Is controlled when he is on all of his meds.  Continue current regimen and RF- - carvedilol (COREG) 25 MG tablet; Take 1 tablet (25 mg total) by mouth 2 (two) times daily with a meal.  Dispense: 60 tablet; Refill: 6  5. ETOH abuse Not drinking-congratulated him on this!!  6. Hospital discharge follow-up Much improved.  No CP/SOB/dizziness/palpitations.    Follow Up Instructions: Keep cardiology f/up see PCP 2-3 months.     I discussed the assessment and treatment plan with the patient. The patient was provided an opportunity to ask questions and all were answered. The patient agreed with the plan and demonstrated an understanding of the instructions.   The patient was advised to call back or seek an in-person evaluation if the symptoms worsen or if the condition fails to improve as anticipated.  I provided 14 minutes of non-face-to-face time during this encounter.   Freeman Caldron, PA-C

## 2018-03-31 ENCOUNTER — Other Ambulatory Visit: Payer: Self-pay

## 2018-03-31 ENCOUNTER — Ambulatory Visit: Payer: Self-pay | Attending: Nurse Practitioner | Admitting: Physician Assistant

## 2018-03-31 DIAGNOSIS — I10 Essential (primary) hypertension: Secondary | ICD-10-CM

## 2018-03-31 DIAGNOSIS — F1010 Alcohol abuse, uncomplicated: Secondary | ICD-10-CM

## 2018-03-31 DIAGNOSIS — I472 Ventricular tachycardia, unspecified: Secondary | ICD-10-CM

## 2018-03-31 DIAGNOSIS — Z09 Encounter for follow-up examination after completed treatment for conditions other than malignant neoplasm: Secondary | ICD-10-CM

## 2018-03-31 DIAGNOSIS — M1A09X0 Idiopathic chronic gout, multiple sites, without tophus (tophi): Secondary | ICD-10-CM

## 2018-03-31 DIAGNOSIS — F5104 Psychophysiologic insomnia: Secondary | ICD-10-CM

## 2018-03-31 MED ORDER — TRAZODONE HCL 150 MG PO TABS: 150.0000 mg | ORAL_TABLET | Freq: Every day | ORAL | 3 refills | Status: DC

## 2018-03-31 MED ORDER — ALLOPURINOL 100 MG PO TABS: 200.0000 mg | ORAL_TABLET | Freq: Every day | ORAL | 3 refills | Status: DC

## 2018-03-31 MED ORDER — CARVEDILOL 25 MG PO TABS: 25.0000 mg | ORAL_TABLET | Freq: Two times a day (BID) | ORAL | 6 refills | Status: DC

## 2018-03-31 NOTE — Progress Notes (Signed)
Pt states he didn't sleep to good last night  Pt states he has gout coming back on right knee   Pt states he does have a hx of gout

## 2018-04-07 ENCOUNTER — Other Ambulatory Visit: Payer: Self-pay | Admitting: Physician Assistant

## 2018-04-18 ENCOUNTER — Telehealth (HOSPITAL_COMMUNITY): Payer: Self-pay | Admitting: Licensed Clinical Social Worker

## 2018-04-18 ENCOUNTER — Telehealth (INDEPENDENT_AMBULATORY_CARE_PROVIDER_SITE_OTHER): Payer: Self-pay | Admitting: Internal Medicine

## 2018-04-18 ENCOUNTER — Other Ambulatory Visit: Payer: Self-pay

## 2018-04-18 ENCOUNTER — Telehealth: Payer: Self-pay

## 2018-04-18 DIAGNOSIS — I25118 Atherosclerotic heart disease of native coronary artery with other forms of angina pectoris: Secondary | ICD-10-CM

## 2018-04-18 DIAGNOSIS — I472 Ventricular tachycardia, unspecified: Secondary | ICD-10-CM

## 2018-04-18 DIAGNOSIS — I10 Essential (primary) hypertension: Secondary | ICD-10-CM

## 2018-04-18 DIAGNOSIS — E782 Mixed hyperlipidemia: Secondary | ICD-10-CM

## 2018-04-18 DIAGNOSIS — I255 Ischemic cardiomyopathy: Secondary | ICD-10-CM

## 2018-04-18 DIAGNOSIS — I519 Heart disease, unspecified: Secondary | ICD-10-CM

## 2018-04-18 DIAGNOSIS — I340 Nonrheumatic mitral (valve) insufficiency: Secondary | ICD-10-CM

## 2018-04-18 DIAGNOSIS — I119 Hypertensive heart disease without heart failure: Secondary | ICD-10-CM

## 2018-04-18 DIAGNOSIS — I428 Other cardiomyopathies: Secondary | ICD-10-CM

## 2018-04-18 MED ORDER — POTASSIUM CHLORIDE CRYS ER 20 MEQ PO TBCR: 20.0000 meq | EXTENDED_RELEASE_TABLET | Freq: Every day | ORAL | 3 refills | Status: DC

## 2018-04-18 MED ORDER — LOSARTAN POTASSIUM 50 MG PO TABS: 50.0000 mg | ORAL_TABLET | Freq: Every day | ORAL | 3 refills | Status: DC

## 2018-04-18 MED ORDER — CARVEDILOL 25 MG PO TABS: 25.0000 mg | ORAL_TABLET | Freq: Two times a day (BID) | ORAL | 3 refills | Status: DC

## 2018-04-18 MED ORDER — ATORVASTATIN CALCIUM 40 MG PO TABS: 40.0000 mg | ORAL_TABLET | Freq: Every day | ORAL | 3 refills | Status: DC

## 2018-04-18 MED ORDER — AMIODARONE HCL 200 MG PO TABS: 200.0000 mg | ORAL_TABLET | Freq: Every day | ORAL | 3 refills | Status: DC

## 2018-04-18 MED ORDER — DIGOXIN 125 MCG PO TABS: 0.1250 mg | ORAL_TABLET | Freq: Every day | ORAL | 3 refills | Status: DC

## 2018-04-18 MED ORDER — FUROSEMIDE 80 MG PO TABS: 80.0000 mg | ORAL_TABLET | Freq: Every day | ORAL | 3 refills | Status: DC

## 2018-04-18 NOTE — Telephone Encounter (Signed)
Call placed to Pt.  Advised of 4 wk f/u appt.  Advised a referral has been made to gen cards for him.  Advised Pt medications were filled and sent to [facility].  Per Pt, he cannot pick up his medications until he can get some money.  States he will see if someone can loan him some money.  Sent message to J. Tobe Sos to see if any assistance with medictions available.

## 2018-04-18 NOTE — Progress Notes (Signed)
Electrophysiology TeleHealth Note   Due to national recommendations of social distancing due to COVID 19, an audio/video telehealth visit is felt to be most appropriate for this patient at this time.  Verbal consent was obtained from the patient today.   Date:  04/18/2018   ID:  Stanley [name], DOB 06-03-1965, MRN [id number]  Location: patient's home  Provider location: [address] [place]  Evaluation Performed: Follow-up visit  PCP:  Gildardo Pounds, NP  Cardiologist:  none Electrophysiologist:  Dr Rayann Heman  Chief Complaint:  VT  History of Present Illness:    Stanley [name] is a 53 y.o. male who presents via audio/video conferencing for a telehealth visit today.  Since his recent hospital visit for VT, the patient reports doing well. His arrhythmias are controlled.  He has occasional palpitations at night.  He is taking amiodarone as directed.  He also has stable angina.  Today, he denies symptoms of shortness of breath,  lower extremity edema, dizziness, presyncope, or syncope.  The patient is otherwise without complaint today.  The patient denies symptoms of fevers, chills, cough, or new SOB worrisome for COVID 19.  Past Medical History:  Diagnosis Date  . Acute renal failure (ARF) ([place])   . CAD (coronary artery disease)    a. dx unclear, reported PCI in 2011 but cath 2014 normal coronaries and cardiac CT 07/2016 normal coronaries, no mention of stents.  . CHF (congestive [name] failure) ([place])   . Gout   . Gouty arthritis   . Hypertension   . ICD (implantable cardioverter-defibrillator) in place   . Insomnia   . Nonischemic cardiomyopathy ([place])   . Ventricular tachycardia ([place]) 01/2015   ATP delivered for sinus tach, degenerated into VT for which he received shock    Past Surgical History:  Procedure Laterality Date  . CARDIAC DEFIBRILLATOR PLACEMENT  06/05/13   [employer] Inogen ICD implanted at [facility] in [place] for primary  prevention  . HERNIA REPAIR    . PERCUTANEOUS CORONARY STENT INTERVENTION (PCI-S)  2011    Current Outpatient Medications  Medication Sig Dispense Refill  . allopurinol (ZYLOPRIM) 100 MG tablet Take 2 tablets (200 mg total) by mouth daily. 60 tablet 3  . amiodarone (PACERONE) 200 MG tablet START BY TABLET 1 TABLET BY MOUTH TWICE DAILY FOR 2 WEEKS, THEN REDUCE TO 1 TABLET ONCE DAILY 135 tablet 3  . atorvastatin (LIPITOR) 40 MG tablet Take 1 tablet (40 mg total) by mouth daily. 30 tablet 6  . bismuth subsalicylate (PEPTO BISMOL) 262 MG/15ML suspension Take 30 mLs by mouth every 6 (six) hours as needed for indigestion.    . busPIRone (BUSPAR) 10 MG tablet Take 1 tablet (10 mg total) by mouth 3 (three) times daily. (Patient taking differently: Take 10 mg by mouth daily. ) 60 tablet 1  . carvedilol (COREG) 25 MG tablet Take 1 tablet (25 mg total) by mouth 2 (two) times daily with a meal. 60 tablet 6  . digoxin (LANOXIN) 0.125 MG tablet Take 1 tablet (0.125 mg total) by mouth daily. 30 tablet 6  . escitalopram (LEXAPRO) 20 MG tablet Take 1 tablet (20 mg total) by mouth daily. (Patient not taking: Reported on 03/21/2018) 90 tablet 1  . furosemide (LASIX) 80 MG tablet Take 1 tablet (80 mg total) by mouth daily. 30 tablet 6  . ibuprofen (ADVIL,MOTRIN) 200 MG tablet Take 200 mg by mouth every 6 (six) hours as needed (gout).    Marland Kitchen  losartan (COZAAR) 50 MG tablet Take 1 tablet (50 mg total) by mouth daily. 30 tablet 6  . nitroGLYCERIN (NITROSTAT) 0.4 MG SL tablet Place 1 tablet (0.4 mg total) under the tongue every 5 (five) minutes x 3 doses as needed for chest pain. 30 tablet 6  . omeprazole (PRILOSEC) 20 MG capsule Take 1 capsule (20 mg total) by mouth daily. (Patient not taking: Reported on 09/20/2017) 14 capsule 0  . potassium chloride SA (K-DUR,KLOR-CON) 20 MEQ tablet Take 1 tablet (20 mEq total) by mouth daily. 30 tablet 6  . traZODone (DESYREL) 150 MG tablet Take 1 tablet (150 mg total) by mouth at  bedtime. 30 tablet 3   No current facility-administered medications for this visit.     Allergies:   Lisinopril; Apple; Other; and Shellfish allergy   Social History:  The patient  reports that he has never smoked. He has never used smokeless tobacco. He reports previous alcohol use. He reports that he does not use drugs.   Family History:  The patient's  family history includes Alzheimer's disease in his father; Bronchitis in his brother; Cancer in his mother; Clotting disorder in his sister; Dementia in his father; Gout in his father and paternal grandfather; [name] disease in his maternal grandmother; Hypertension in his father and sister; Obesity in his brother.   ROS:  Please see the history of present illness.   All other systems are personally reviewed and negative.    Exam:    Vital Signs:  There were no vitals taken for this visit.  Well appearing, alert and conversant, regular work of breathing,  good skin color Eyes- anicteric, neuro- grossly intact, skin- no apparent rash or lesions or cyanosis, mouth- oral mucosa is pink   Labs/Other Tests and Data Reviewed:    Recent Labs: 03/21/2018: B Natriuretic Peptide 33.7; Hemoglobin 15.7; Platelets 225; TSH 0.485 03/22/2018: ALT 54; BUN 21; Creatinine, Ser 1.32; Magnesium 2.1; Potassium 3.7; Sodium 136   Wt Readings from Last 3 Encounters:  03/22/18 176 lb 12.8 oz (80.2 kg)  09/20/17 199 lb (90.3 kg)  05/12/17 183 lb (83 kg)     Other studies personally reviewed: Additional studies/ records that were reviewed today include: my notes from recent hospitlization  Review of the above records today demonstrates: as above Prior radiographs: CXR 03/21/2018- cardiomegally without acute airspace disease   Last device remote is reviewed from [facility] PDF dated 03/08/2018 which reveals normal device function,  Echo 03/22/2018- EF 25%, normal RV function   ASSESSMENT & PLAN:    1.  VT The patient was recently hospitalized after  appropriate therapy for VT VT appears to be better controlled Continue amiodarone 200mg  daily Normal device function Continue to follow with remotes  2. Nonischemic CM/ chronic systolic dysfunction CHF is currently controlled I will try to get scales and BP cuff to allow ICM device clinic follow-up.  3. ETOH  advoidance encouraged  4. MR Previously said to be severe Mild by echo in hospital in March (reviewed)  5. CAD Chest pain is stable but ongoing He reports compliance with medical therapy Consider further CV risk stratification after COVID 19 He should initiate follow-up with general cardiology  6. HTN I will try to get him a BP cuff  7. COVID 19 screen The patient denies symptoms of COVID 19 at this time.  The importance of social distancing was discussed today.  Follow-up:  EP PA in 4 months Next remote: 06/2018  Current medicines are reviewed at length  with the patient today.   The patient does not have concerns regarding his medicines.  The following changes were made today:  none  Labs/ tests ordered today include:  No orders of the defined types were placed in this encounter.  Patient Risk:  after full review of this patients clinical status, I feel that they are at moderate to high risk at this time.  Today, I have spent 15 minutes with the patient with telehealth technology discussing VT.    Army Fossa, MD  04/18/2018 11:36 AM     [facility] [address] [place] [address] [phone] (office) [phone] (fax)

## 2018-04-18 NOTE — Telephone Encounter (Signed)
CSW received referral from RN to assist patient with obtaining post hospitalization medications. CSW contacted patient with no answer. Message left for return call. Raquel Sarna, [place], [place]

## 2018-04-19 ENCOUNTER — Telehealth (HOSPITAL_COMMUNITY): Payer: Self-pay | Admitting: Licensed Clinical Social Worker

## 2018-04-19 NOTE — Telephone Encounter (Signed)
CSW attempted again to reach out to patient to offer support and assistance with obtaining medications with no answer. Message left for return call. Raquel Sarna, [place], [place]

## 2018-04-22 ENCOUNTER — Encounter: Payer: Self-pay | Admitting: Internal Medicine

## 2018-05-12 ENCOUNTER — Encounter: Payer: Self-pay | Admitting: Internal Medicine

## 2018-05-12 ENCOUNTER — Telehealth (INDEPENDENT_AMBULATORY_CARE_PROVIDER_SITE_OTHER): Payer: Medicaid Other | Admitting: Internal Medicine

## 2018-05-12 ENCOUNTER — Other Ambulatory Visit: Payer: Self-pay

## 2018-05-12 VITALS — Ht 64.5 in

## 2018-05-12 DIAGNOSIS — E782 Mixed hyperlipidemia: Secondary | ICD-10-CM

## 2018-05-12 DIAGNOSIS — I428 Other cardiomyopathies: Secondary | ICD-10-CM

## 2018-05-12 DIAGNOSIS — F1010 Alcohol abuse, uncomplicated: Secondary | ICD-10-CM

## 2018-05-12 DIAGNOSIS — I472 Ventricular tachycardia, unspecified: Secondary | ICD-10-CM

## 2018-05-12 DIAGNOSIS — I10 Essential (primary) hypertension: Secondary | ICD-10-CM | POA: Diagnosis not present

## 2018-05-12 NOTE — Progress Notes (Signed)
Virtual Visit via Telephone Note   This visit type was conducted due to national recommendations for restrictions regarding the COVID-19 Pandemic (e.g. social distancing) in an effort to limit this patient's exposure and mitigate transmission in our community.  Due to his co-morbid illnesses, this patient is at least at moderate risk for complications without adequate follow up.  This format is felt to be most appropriate for this patient at this time.  The patient did not have access to video technology/had technical difficulties with video requiring transitioning to audio format only (telephone).  All issues noted in this document were discussed and addressed.  No physical exam could be performed with this format.  Please refer to the patient's chart for his  consent to telehealth for [facility].   Date:  05/12/2018   ID:  Stanley [name], DOB 05-24-1965, MRN [id number]  Patient Location: Home Provider Location: Home  PCP:  Gildardo Pounds, NP  Cardiologist:  New Electrophysiologist: Allred  Evaluation Performed:  New Patient Evaluation  Chief Complaint:  Establish for general CHF follow up    History of Present Illness:    Stanley [name] is a 53 y.o. male with hx of NICM   Dx 2006  Felt due to EtOH and HTN  Hx of ? CAD with PCI Cath in 2014 reported normal and CT with no mention of stents  . Pt has ahistory of VT ICD implant in 2015  MOved to [place] in 2017   HE has been seen by J ALlred   Hospitalized in March 17 with VT   Started on amiodarone    Last seen by J Allred on 04/18/18.     Pt says he has a little tightness in chest  Some association with chest tightness and eating    Some SOB Does not weigh himself  Does have some PND   Occasional Chronic   Intermittent He denies ankle edema   The patient does not have symptoms concerning for COVID-19 infection (fever, chills, cough, or new shortness of breath).    Past Medical History:  Diagnosis Date  . Acute renal  failure (ARF) ([place])   . CAD (coronary artery disease)    a. dx unclear, reported PCI in 2011 but cath 2014 normal coronaries and cardiac CT 07/2016 normal coronaries, no mention of stents.  . CHF (congestive heart failure) ([place])   . Gout   . Gouty arthritis   . Hypertension   . ICD (implantable cardioverter-defibrillator) in place   . Insomnia   . Nonischemic cardiomyopathy ([place])   . Ventricular tachycardia ([place]) 01/2015   ATP delivered for sinus tach, degenerated into VT for which he received shock   Past Surgical History:  Procedure Laterality Date  . CARDIAC DEFIBRILLATOR PLACEMENT  06/05/13   [employer] Inogen ICD implanted at [facility] in [place] for primary prevention  . HERNIA REPAIR    . PERCUTANEOUS CORONARY STENT INTERVENTION (PCI-S)  2011     Current Meds  Medication Sig  . allopurinol (ZYLOPRIM) 100 MG tablet Take 2 tablets (200 mg total) by mouth daily.  Marland Kitchen amiodarone (PACERONE) 200 MG tablet Take 1 tablet (200 mg total) by mouth daily.  Marland Kitchen atorvastatin (LIPITOR) 40 MG tablet Take 1 tablet (40 mg total) by mouth daily.  Marland Kitchen bismuth subsalicylate (PEPTO BISMOL) 262 MG/15ML suspension Take 30 mLs by mouth every 6 (six) hours as needed for indigestion.  . busPIRone (BUSPAR) 10 MG tablet Take 1 tablet (10 mg total)  by mouth 3 (three) times daily. (Patient taking differently: Take 10 mg by mouth daily. )  . carvedilol (COREG) 25 MG tablet Take 1 tablet (25 mg total) by mouth 2 (two) times daily with a meal.  . digoxin (LANOXIN) 0.125 MG tablet Take 1 tablet (0.125 mg total) by mouth daily.  . furosemide (LASIX) 80 MG tablet Take 1 tablet (80 mg total) by mouth daily.  Marland Kitchen ibuprofen (ADVIL,MOTRIN) 200 MG tablet Take 200 mg by mouth every 6 (six) hours as needed (gout).  Marland Kitchen losartan (COZAAR) 50 MG tablet Take 1 tablet (50 mg total) by mouth daily.  . nitroGLYCERIN (NITROSTAT) 0.4 MG SL tablet Place 1 tablet (0.4 mg total) under the tongue every 5 (five) minutes x 3  doses as needed for chest pain.  . potassium chloride SA (K-DUR,KLOR-CON) 20 MEQ tablet Take 1 tablet (20 mEq total) by mouth daily.  . traZODone (DESYREL) 150 MG tablet Take 1 tablet (150 mg total) by mouth at bedtime.     Allergies:   Lisinopril; Apple; Other; and Shellfish allergy   Social History   Tobacco Use  . Smoking status: Never Smoker  . Smokeless tobacco: Never Used  Substance Use Topics  . Alcohol use: Not Currently    Alcohol/week: 0.0 standard drinks  . Drug use: No     Family Hx: The patient's family history includes Alzheimer's disease in his father; Bronchitis in his brother; Cancer in his mother; Clotting disorder in his sister; Dementia in his father; Gout in his father and paternal grandfather; Heart disease in his maternal grandmother; Hypertension in his father and sister; Obesity in his brother.  ROS:   Please see the history of present illness.    All other systems reviewed and are negative.   Prior CV studies:   Echo 03/22/18  Indications:    Ventricular tachycardia [facility])   History:        Patient has prior history of Echocardiogram examinations, most                 recent 03/09/2016. CHF, Defibrillator; Risk Factors: Hypertension.                 Alcohol abuse.   Sonographer:    Clayton Lefort RDCS (AE) Referring Phys: [phone] [facility]    Sonographer Comments: Patient asleep, snoring, and moving throughout test. IMPRESSIONS    1. The left ventricle has severely reduced systolic function, with an ejection fraction of 25-30%. The cavity size was mildly dilated. There is mildly increased left ventricular wall thickness. Left ventricular diastolic Doppler parameters are  consistent with impaired relaxation. Elevated left atrial and left ventricular end-diastolic pressures The E/e' is >15. Left ventricular diffuse hypokinesis.  2. The right ventricle has normal systolic function. The cavity was mildly enlarged. There is no increase in right ventricular  wall thickness.  3. The mitral valve is degenerative. Mild thickening of the mitral valve leaflet. Mild calcification of the mitral valve leaflet. There is mild to moderate mitral annular calcification present.  4. The aortic valve was not well visualized Mild calcification of the aortic valve.  5. The aortic root and ascending aorta are normal in size and structure.  6. The interatrial septum was not well visualized.  7. When compared to the prior study: 03/09/2016: LVEF 35-40%.  8. No intracardiac thrombi or masses were visualized.  SUMMARY   LVEF 25-30%, mildly dilated LV with mild LVH, severe global hypokinesis with regional variation, grade 1 DD, elevated  LV filling pressure, AICD wires noted, mild RVE with normal RV systolic function, MAC with mild MR, mild TR, normal RVSP, normal IVC  FINDINGS  Left Ventricle: The left ventricle has severely reduced systolic function, with an ejection fraction of 25-30%. The cavity size was mildly dilated. There is mildly increased left ventricular wall thickness. Left ventricular diastolic Doppler parameters  are consistent with impaired relaxation. Elevated left atrial and left ventricular end-diastolic pressures The E/e' is >15. Left ventricular diffuse hypokinesis. Definity contrast agent was given IV to delineate the left ventricular endocardial borders. Right Ventricle: The right ventricle has normal systolic function. The cavity was mildly enlarged. There is no increase in right ventricular wall thickness. Pacing wire/catheter visualized in the right ventricle. Left Atrium: left atrial size was normal in size Right Atrium: right atrial size was normal in size. Right atrial pressure is estimated at 3 mmHg. Interatrial Septum: The interatrial septum was not well visualized. Pericardium: There is no evidence of pericardial effusion. Mitral Valve: The mitral valve is degenerative in appearance. Mild thickening of the mitral valve leaflet. Mild  calcification of the mitral valve leaflet. There is mild to moderate mitral annular calcification present. Mitral valve regurgitation is mild  by color flow Doppler. Tricuspid Valve: The tricuspid valve is not well visualized. Tricuspid valve regurgitation is mild by color flow Doppler. Aortic Valve: The aortic valve was not well visualized Mild calcification of the aortic valve. Aortic valve regurgitation was not visualized by color flow Doppler. There is no evidence of aortic valve stenosis. Pulmonic Valve: The pulmonic valve was not well visualized. Pulmonic valve regurgitation is not visualized by color flow Doppler. Aorta: The aortic root and ascending aorta are normal in size and structure. Venous: The inferior vena cava measures 3.00 cm, is normal in size with greater than 50% respiratory variability. Compared to previous exam: 03/09/2016: LVEF 35-40%.   LEFT VENTRICLE PLAX 2D LVIDd:         5.97 cm  Diastology LVIDs:         5.46 cm  LV e' lateral:   2.84 cm/s LV PW:         1.03 cm  LV E/e' lateral: 22.0 LV IVS:        1.06 cm  LV e' medial:    4.30 cm/s LVOT diam:     1.90 cm  LV E/e' medial:  14.6 LV SV:         33 ml LV SV Index:   16.99 LVOT Area:     2.84 cm  RIGHT VENTRICLE RV S prime:     7.22 cm/s TAPSE (M-mode): 0.9 cm RVSP:           21.0 mmHg  LEFT ATRIUM             Index       RIGHT ATRIUM           Index LA diam:        3.80 cm 2.02 cm/m  RA Pressure: 3 mmHg LA Vol (A2C):   49.6 ml 26.42 ml/m RA Area:     14.40 cm LA Vol (A4C):   52.7 ml 28.07 ml/m RA Volume:   38.00 ml  20.24 ml/m LA Biplane Vol: 54.7 ml 29.14 ml/m  AORTIC VALVE LVOT Vmax:   80.33 cm/s LVOT Vmean:  50.800 cm/s LVOT VTI:    0.129 m   AORTA Ao Root diam: 2.50 cm  MITRAL VALVE  TRICUSPID VALVE MV Area (PHT): 3.89 cm      TR Peak grad:   18.0 mmHg MV Peak grad:  3.7 mmHg      TR Vmax:        238.00 cm/s MV Mean grad:  1.0 mmHg      RVSP:           21.0 mmHg MV  Vmax:       0.97 m/s MV Vmean:      50.9 cm/s     SHUNTS MV VTI:        0.20 m        Systemic VTI:  0.13 m MV PHT:        56.55 msec    Systemic Diam: 1.90 cm MV Decel Time: 195 msec MR Peak grad:    84.3 mmHg MR Mean grad:    51.0 mmHg MR Vmax:         459.00 cm/s MR Vmean:        332.0 cm/s MR PISA:         1.57 cm MR PISA Eff ROA: 11 mm MR PISA Radius:  0.50 cm MV E velocity: 62.60 cm/s MV A velocity: 108.00 cm/s MV E/A ratio:  0.58  IVC IVC diam: 3.00 cm    Lyman Bishop MD Electronically signed by Lyman Bishop MD Signature Date/Time: 03/22/2018/12:11:57 PM    Labs/Other Tests and Data Reviewed:    EKG:  NO EKG dones as televisit  Recent Labs: 03/21/2018: B Natriuretic Peptide 33.7; Hemoglobin 15.7; Platelets 225; TSH 0.485 03/22/2018: ALT 54; BUN 21; Creatinine, Ser 1.32; Magnesium 2.1; Potassium 3.7; Sodium 136   Recent Lipid Panel Lab Results  Component Value Date/Time   CHOL 257 (H) 04/12/2017 04:27 PM   TRIG 184 (H) 04/12/2017 04:27 PM   HDL 48 04/12/2017 04:27 PM   CHOLHDL 5.4 (H) 04/12/2017 04:27 PM   LDLCALC 172 (H) 04/12/2017 04:27 PM    Wt Readings from Last 3 Encounters:  03/22/18 176 lb 12.8 oz (80.2 kg)  09/20/17 199 lb (90.3 kg)  05/12/17 183 lb (83 kg)     Objective:    Vital Signs:  Ht 5' 4.5" (1.638 m)   BMI 29.88 kg/m    VS   Not done   PT does not have BP cuff available  ASSESSMENT & PLAN:    1,  NICM Echo in March 2020 noted above     COntineu digoxin, lasix 80, cozaar, carvedilol for now    2  Mitral regurg:  Mild to mod on echo in March  3  Hx VT   Hospitalized in March 2020  No on amiodarone   No recurrence   4  Hx EtOH abuse  COunselled  5  HTN   Needs BP cuff  6  Hx HL   Keep on lipitor         COVID-19 Education: The signs and symptoms of COVID-19 were discussed with the patient and how to seek care for testing (follow up with PCP or arrange E-visit).  The importance of social distancing was discussed  today.  Time:   Today, I have spent 20 minutes with the patient with telehealth technology discussing the above problems.     Medication Adjustments/Labs and Tests Ordered: Current medicines are reviewed at length with the patient today.  Concerns regarding medicines are outlined above.   Tests Ordered: No orders of the defined types were placed in this encounter.   Medication Changes: No  orders of the defined types were placed in this encounter.   Disposition:  Follow up Labs in next week   F/U in clinic this summer   Signed, Dorris Carnes, MD  05/12/2018 [place]

## 2018-05-13 ENCOUNTER — Telehealth: Payer: Self-pay

## 2018-05-13 NOTE — Telephone Encounter (Signed)
Spoke with pt regarding appt on 05/16/18. Pt stated he can not check vitals prior to appt. Pt was advise to call me back if he had any questions.

## 2018-05-16 ENCOUNTER — Telehealth: Payer: Self-pay

## 2018-05-16 ENCOUNTER — Other Ambulatory Visit: Payer: Self-pay

## 2018-05-16 ENCOUNTER — Telehealth (INDEPENDENT_AMBULATORY_CARE_PROVIDER_SITE_OTHER): Payer: Medicaid Other | Admitting: Internal Medicine

## 2018-05-16 DIAGNOSIS — I10 Essential (primary) hypertension: Secondary | ICD-10-CM

## 2018-05-16 DIAGNOSIS — I428 Other cardiomyopathies: Secondary | ICD-10-CM | POA: Diagnosis not present

## 2018-05-16 DIAGNOSIS — F1010 Alcohol abuse, uncomplicated: Secondary | ICD-10-CM | POA: Diagnosis not present

## 2018-05-16 DIAGNOSIS — G4733 Obstructive sleep apnea (adult) (pediatric): Secondary | ICD-10-CM

## 2018-05-16 DIAGNOSIS — I472 Ventricular tachycardia: Secondary | ICD-10-CM

## 2018-05-16 NOTE — Progress Notes (Signed)
Electrophysiology TeleHealth Note   Due to national recommendations of social distancing due to COVID 19, an audio/video telehealth visit is felt to be most appropriate for this patient at this time.  Verbal consent was obtained from patient today.  We used [employer] Duo for this encounter.   Date:  05/16/2018   ID:  Stanley [name], DOB Jun 27, 1965, MRN [id number]  Location: patient's home  Provider location: [address], [place]  Evaluation Performed: Follow-up visit  PCP:  Gildardo Pounds, NP  Cardiologist:  Dr Harrington Challenger Electrophysiologist:  Dr Rayann Heman  Chief Complaint:  VT  History of Present Illness:    Stanley [name] is a 53 y.o. male who presents via audio/video conferencing for a telehealth visit today.  Since last being seen in our clinic, the patient reports doing very well.  SOB is stable. + orthopnea  Today, he denies symptoms of palpitations, exertional chest pain,  lower extremity edema, dizziness, presyncope, or syncope.  He is fearful of sleeping at times due to apneic events.  He has sleep apnea diagnosed but has not followed through on treatment. The patient is otherwise without complaint today.  The patient denies symptoms of fevers, chills, cough, or new SOB worrisome for COVID 19.  Past Medical History:  Diagnosis Date  . Acute renal failure (ARF) ([place])   . CAD (coronary artery disease)    a. dx unclear, reported PCI in 2011 but cath 2014 normal coronaries and cardiac CT 07/2016 normal coronaries, no mention of stents.  . CHF (congestive heart failure) ([place])   . Gout   . Gouty arthritis   . Hypertension   . ICD (implantable cardioverter-defibrillator) in place   . Insomnia   . Nonischemic cardiomyopathy ([place])   . Ventricular tachycardia ([place]) 01/2015   ATP delivered for sinus tach, degenerated into VT for which he received shock    Past Surgical History:  Procedure Laterality Date  . CARDIAC DEFIBRILLATOR PLACEMENT  06/05/13   [employer] Inogen ICD implanted at [facility] in [place] for primary prevention  . HERNIA REPAIR    . PERCUTANEOUS CORONARY STENT INTERVENTION (PCI-S)  2011    Current Outpatient Medications  Medication Sig Dispense Refill  . allopurinol (ZYLOPRIM) 100 MG tablet Take 2 tablets (200 mg total) by mouth daily. 60 tablet 3  . amiodarone (PACERONE) 200 MG tablet Take 1 tablet (200 mg total) by mouth daily. 90 tablet 3  . atorvastatin (LIPITOR) 40 MG tablet Take 1 tablet (40 mg total) by mouth daily. 90 tablet 3  . bismuth subsalicylate (PEPTO BISMOL) 262 MG/15ML suspension Take 30 mLs by mouth every 6 (six) hours as needed for indigestion.    . busPIRone (BUSPAR) 10 MG tablet Take 1 tablet (10 mg total) by mouth 3 (three) times daily. (Patient taking differently: Take 10 mg by mouth daily. ) 60 tablet 1  . carvedilol (COREG) 25 MG tablet Take 1 tablet (25 mg total) by mouth 2 (two) times daily with a meal. 180 tablet 3  . digoxin (LANOXIN) 0.125 MG tablet Take 1 tablet (0.125 mg total) by mouth daily. 90 tablet 3  . furosemide (LASIX) 80 MG tablet Take 1 tablet (80 mg total) by mouth daily. 90 tablet 3  . ibuprofen (ADVIL,MOTRIN) 200 MG tablet Take 200 mg by mouth every 6 (six) hours as needed (gout).    Marland Kitchen losartan (COZAAR) 50 MG tablet Take 1 tablet (50 mg total) by mouth daily. 90 tablet 3  .  nitroGLYCERIN (NITROSTAT) 0.4 MG SL tablet Place 1 tablet (0.4 mg total) under the tongue every 5 (five) minutes x 3 doses as needed for chest pain. 30 tablet 6  . potassium chloride SA (K-DUR,KLOR-CON) 20 MEQ tablet Take 1 tablet (20 mEq total) by mouth daily. 90 tablet 3  . traZODone (DESYREL) 150 MG tablet Take 1 tablet (150 mg total) by mouth at bedtime. 30 tablet 3   No current facility-administered medications for this visit.     Allergies:   Lisinopril; Apple; Other; and Shellfish allergy   Social History:  The patient  reports that he has never smoked. He has never used smokeless  tobacco. He reports previous alcohol use. He reports that he does not use drugs.   Family History:  The patient's  family history includes Alzheimer's disease in his father; Bronchitis in his brother; Cancer in his mother; Clotting disorder in his sister; Dementia in his father; Gout in his father and paternal grandfather; Heart disease in his maternal grandmother; Hypertension in his father and sister; Obesity in his brother.   ROS:  Please see the history of present illness.   All other systems are personally reviewed and negative.    Exam:    Vital Signs:  There were no vitals taken for this visit.  Well appearing, alert and conversant, regular work of breathing,  good skin color Eyes- anicteric, neuro- grossly intact, skin- no apparent rash or lesions or cyanosis, mouth- oral mucosa is pink   Labs/Other Tests and Data Reviewed:    Recent Labs: 03/21/2018: B Natriuretic Peptide 33.7; Hemoglobin 15.7; Platelets 225; TSH 0.485 03/22/2018: ALT 54; BUN 21; Creatinine, Ser 1.32; Magnesium 2.1; Potassium 3.7; Sodium 136   Wt Readings from Last 3 Encounters:  03/22/18 176 lb 12.8 oz (80.2 kg)  09/20/17 199 lb (90.3 kg)  05/12/17 183 lb (83 kg)     Other studies personally reviewed: Additional studies/ records that were reviewed today include: my prior notes,  Dr Harrington Challenger' notes  Review of the above records today demonstrates: as above  Last device remote is reviewed from [place] PDF dated 03/08/2018 which reveals normal device function, no arrhythmias    ASSESSMENT & PLAN:    1.  VT Controlled with amiodarone 200 mg daily No changes today  2. Nonischemic CM/ chronic systolic dysfunction Stable No change required today  3. ETOH Avoidance encouraged  4. HTN Stable No change required today  5. MR Mild by echo in March Dr Harrington Challenger to follow  6. CAD Stable No change required today  7. OSA Not compliant with treatment  Refer to Dr Radford Pax for management  8. COVID 19 screen  The patient denies symptoms of COVID 19 at this time.  The importance of social distancing was discussed today.  Follow-up:  4 months in clinic with [place] Next remote: 06/2018  Current medicines are reviewed at length with the patient today.   The patient does not have concerns regarding his medicines.  The following changes were made today:  none   Patient Risk:  after full review of this patients clinical status, I feel that they are at moderate to high risk at this time.  Today, I have spent 20 minutes with the patient with telehealth technology discussing VT and CHF .    Army Fossa, MD  05/16/2018 9:17 AM     [facility] [address] [place]  [place] [address] [phone] (office) [phone] (fax)

## 2018-05-16 NOTE — Progress Notes (Signed)
Order entered for referral to Dr. Radford Pax for management of OSA per Dr. Rayann Heman.

## 2018-05-16 NOTE — Telephone Encounter (Signed)
Call to patient to discuss Dr Allred's request for him to have a set of scales and BP monitor.  Advised pt I have requested the equipment to be sent to him from [employer].  Confirmed address and advised will call him back if I have an estimate when it will be shipped to him. He said that would be fine and he appreciates the help.

## 2018-05-20 NOTE — Telephone Encounter (Signed)
Attempted patient call and left message to return call regarding [employer] rep will drop off BP Cuff and Scale on front porch if patient agrees.

## 2018-05-24 NOTE — Telephone Encounter (Signed)
Spoke with patient and advised Stanley [name]. [employer] Rep will drop all the equipment at his home if he is agreeable and he gave permission.  He said he will be home all week.  He appreciated the assistance in obtaining the equipment.

## 2018-05-26 ENCOUNTER — Telehealth: Payer: Self-pay | Admitting: *Deleted

## 2018-05-26 DIAGNOSIS — G4733 Obstructive sleep apnea (adult) (pediatric): Secondary | ICD-10-CM

## 2018-05-26 NOTE — Telephone Encounter (Signed)
Reached out to the patient to get information on how to find his sleep study as there is not one in his chart. Patient states he had his sleep study in the [place] at the [facility].  The [place], [place]: Ms. Carson Myrtle records Ph#   [phone] Fax#  [phone]  All sleep studies, titrations, office notes

## 2018-05-26 NOTE — Telephone Encounter (Signed)
-----   Message from Damian Leavell, RN sent at 05/23/2018  8:56 AM EDT ----- Regarding: RE: REFERRAL I think he just needs an appointment.  This is what Dr. Rayann Heman put in his note:  7. OSA Not compliant with treatment  Refer to Dr Radford Pax for management   I read that as he was told what to do, but hasn't been doing it Sonia Baller ----- Message ----- From: Freada Bergeron, CMA Sent: 05/20/2018   5:31 PM EDT To: Damian Leavell, RN Subject: Ebbie [place], does patient need an appointment or sleep study? Thanks,  Gae Bon

## 2018-06-01 ENCOUNTER — Telehealth: Payer: Self-pay

## 2018-06-01 NOTE — Telephone Encounter (Signed)
Per [facility], no sleep studies/no office notes pertaining to sleep study. 06/01/18 v[place]

## 2018-06-07 ENCOUNTER — Ambulatory Visit (INDEPENDENT_AMBULATORY_CARE_PROVIDER_SITE_OTHER): Payer: Medicaid Other | Admitting: *Deleted

## 2018-06-07 DIAGNOSIS — I472 Ventricular tachycardia, unspecified: Secondary | ICD-10-CM

## 2018-06-07 DIAGNOSIS — I428 Other cardiomyopathies: Secondary | ICD-10-CM

## 2018-06-07 LAB — CUP PACEART REMOTE DEVICE CHECK
Battery Remaining Longevity: 126 mo
Battery Remaining Percentage: 100 %
Brady Statistic RV Percent Paced: 0 %
Date Time Interrogation Session: 20200602111700
HighPow Impedance: 53 Ohm
Implantable Lead Implant Date: 20150601
Implantable Lead Location: 753860
Implantable Lead Model: 295
Implantable Lead Serial Number: 134196
Implantable Pulse Generator Implant Date: 20150601
Lead Channel Impedance Value: 432 Ohm
Lead Channel Pacing Threshold Amplitude: 1.5 V
Lead Channel Pacing Threshold Pulse Width: 0.4 ms
Lead Channel Setting Pacing Amplitude: 2.5 V
Lead Channel Setting Pacing Pulse Width: 0.4 ms
Lead Channel Setting Sensing Sensitivity: 0.6 mV
Pulse Gen Serial Number: 190689

## 2018-06-07 NOTE — Telephone Encounter (Addendum)
Patient said to try [facility] in [place], I will send them a records request.

## 2018-06-07 NOTE — Telephone Encounter (Signed)
Late entry:06/01/18 Results came back for patient that no sleep studies or office notes were found

## 2018-06-07 NOTE — Telephone Encounter (Signed)
Patient has an appointment scheduled for 07/11/18 at 1:20 with Dr Radford Pax

## 2018-06-15 ENCOUNTER — Encounter: Payer: Self-pay | Admitting: Cardiology

## 2018-06-15 NOTE — Progress Notes (Signed)
Remote ICD transmission.   

## 2018-07-11 ENCOUNTER — Telehealth: Payer: Medicaid Other | Admitting: Cardiology

## 2018-07-16 ENCOUNTER — Other Ambulatory Visit: Payer: Self-pay | Admitting: Physician Assistant

## 2018-07-16 DIAGNOSIS — F5104 Psychophysiologic insomnia: Secondary | ICD-10-CM

## 2018-07-25 NOTE — Telephone Encounter (Signed)
7/6 appt cancelled, patient to be scheduled for a sleep study.   7RE: AMB REFERRAL TO CARDIOLOGY Lauralee Evener, CMA  Freada Bergeron, CMA        Patient has Medicaid does not require a Pre cert. Ok to schedule sleep study.

## 2018-07-27 NOTE — Addendum Note (Signed)
Addended by: Freada Bergeron on: 07/27/2018 11:56 AM   Modules accepted: Orders

## 2018-08-01 NOTE — Telephone Encounter (Signed)
Patient is scheduled for lab study on 08/11/18. Patient understands his sleep study will be done at [facility] sleep lab. Patient understands he will receive a sleep packet in a week or so. Patient understands to call if he does not receive the sleep packet in a timely manner. He is scheduled for COVID screening on 8/3 prior to sleep study.  Patient agrees with treatment and thanked me for call.

## 2018-08-08 ENCOUNTER — Other Ambulatory Visit (HOSPITAL_COMMUNITY)
Admission: RE | Admit: 2018-08-08 | Discharge: 2018-08-08 | Disposition: A | Discharge status: Home / Self Care | Payer: Medicaid Other | Source: Ambulatory Visit | Attending: Cardiology | Admitting: Cardiology

## 2018-08-08 DIAGNOSIS — Z20828 Contact with and (suspected) exposure to other viral communicable diseases: Secondary | ICD-10-CM | POA: Diagnosis not present

## 2018-08-08 DIAGNOSIS — Z01812 Encounter for preprocedural laboratory examination: Secondary | ICD-10-CM | POA: Insufficient documentation

## 2018-08-08 LAB — SARS CORONAVIRUS 2 (TAT 6-24 HRS): SARS Coronavirus 2: NEGATIVE

## 2018-08-11 ENCOUNTER — Other Ambulatory Visit: Payer: Self-pay

## 2018-08-11 ENCOUNTER — Ambulatory Visit (HOSPITAL_BASED_OUTPATIENT_CLINIC_OR_DEPARTMENT_OTHER): Payer: Medicaid Other | Attending: Internal Medicine | Admitting: Cardiology

## 2018-08-11 DIAGNOSIS — G4733 Obstructive sleep apnea (adult) (pediatric): Secondary | ICD-10-CM

## 2018-08-12 NOTE — Procedures (Signed)
    Patient Name: Stanley [name], Stanley [name] Date: 08/11/2018 Gender: Male D.O.B: Sep 16, 1965 Age (years): 53 Referring Provider: Thompson Grayer Height (inches): 82 Interpreting Physician: Fransico Him MD, ABSM Weight (lbs): 181 RPSGT: Jorge Ny BMI: 30 MRN: [id number] Neck Size: 16.00  CLINICAL INFORMATION Sleep Study Type: NPSG  Indication for sleep study: Congestive Heart Failure, Hypertension, Snoring  Epworth Sleepiness Score: 5  SLEEP STUDY TECHNIQUE As per the AASM Manual for the Scoring of Sleep and Associated Events v2.3 (April 2016) with a hypopnea requiring 4% desaturations.  The channels recorded and monitored were frontal, central and occipital EEG, electrooculogram (EOG), submentalis EMG (chin), nasal and oral airflow, thoracic and abdominal wall motion, anterior tibialis EMG, snore microphone, electrocardiogram, and pulse oximetry.  MEDICATIONS Medications self-administered by patient taken the night of the study : TRAZODONE  SLEEP ARCHITECTURE The study was initiated at 10:39:23 PM and ended at 5:04:25 AM.  Sleep onset time was 13.5 minutes and the sleep efficiency was 95.1%%. The total sleep time was 366 minutes.  Stage REM latency was 138.5 minutes.  The patient spent 1.8%% of the night in stage N1 sleep, 76.2%% in stage N2 sleep, 0.0%% in stage N3 and 22% in REM.  Alpha intrusion was absent.  Supine sleep was 0.68%.  RESPIRATORY PARAMETERS The overall apnea/hypopnea index (AHI) was 6.6 per hour. There were 6 total apneas, including 6 obstructive, 0 central and 0 mixed apneas. There were 34 hypopneas and 1 RERAs.  The AHI during Stage REM sleep was 20.9 per hour.  AHI while supine was 96.0 per hour.  The mean oxygen saturation was 94.0%. The minimum SpO2 during sleep was 79.0%.  moderate snoring was noted during this study.  CARDIAC DATA The 2 lead EKG demonstrated sinus rhythm. The mean heart rate was 72.2 beats per minute. Other EKG  findings include: None.  LEG MOVEMENT DATA The total PLMS were 0 with a resulting PLMS index of 0.0. Associated arousal with leg movement index was 0.3 .  IMPRESSIONS - Mild obstructive sleep apnea occurred during this study (AHI = 6.6/h). - No significant central sleep apnea occurred during this study (CAI = 0.0/h). - Moderate oxygen desaturation was noted during this study (Min O2 = 79.0%). - The patient snored with moderate snoring volume. - No cardiac abnormalities were noted during this study. - Clinically significant periodic limb movements did not occur during sleep. No significant associated arousals.  DIAGNOSIS - Obstructive Sleep Apnea (327.23 [G47.33 ICD-10])  RECOMMENDATIONS - Mild sleep apnea overall but Moderate during REM sleep (AHI 20/hr) and O2 sats drop to 70%. - Recommend a trial of CPAP therapy on auto CPAP from 4 to 18cm H2O with heated humidity and mask of choice.  - Positional therapy avoiding supine position during sleep. - Avoid alcohol, sedatives and other CNS depressants that may worsen sleep apnea and disrupt normal sleep architecture. - Sleep hygiene should be reviewed to assess factors that may improve sleep quality. - Weight management and regular exercise should be initiated or continued if appropriate.  [Electronically signed] 08/12/2018 03:45 PM  Fransico Him MD, ABSM Diplomate, American Board of Sleep Medicine

## 2018-08-15 ENCOUNTER — Telehealth: Payer: Self-pay | Admitting: *Deleted

## 2018-08-15 NOTE — Telephone Encounter (Signed)
Informed patient of sleep study results and patient understanding was verbalized. Patient understands his sleep study showed they have significant sleep apnea will be set up with an auto PAP unit.  Order is in [place]. Please set patient up for OV in 10 weeks. Upon patient request DME selection is CHOICE. Patient understands HE will be contacted by [facility] to set up HIS cpap. Patient understands to call if [facility] does not contact HIM with new setup in a timely manner. Patient understands they will be called once confirmation has been received from [facility] that they have received their new machine to schedule 10 week follow up appointment.  [facility] notified of new cpap order  Please add to airview Patient was grateful for the call and thanked me.

## 2018-08-15 NOTE — Telephone Encounter (Signed)
-----   Message from Sueanne Margarita, MD sent at 08/12/2018  3:49 PM EDT ----- Please let patient know that they have significant sleep apnea will be set up with an auto PAP unit.  Please let DME know that order is in [facility].  Please set patient up for OV in 10 weeks

## 2018-08-18 ENCOUNTER — Encounter (HOSPITAL_COMMUNITY): Payer: Self-pay | Admitting: Internal Medicine

## 2018-08-18 ENCOUNTER — Other Ambulatory Visit: Payer: Self-pay

## 2018-08-18 ENCOUNTER — Ambulatory Visit (HOSPITAL_COMMUNITY)
Admission: RE | Admit: 2018-08-18 | Discharge: 2018-08-18 | Disposition: A | Discharge status: Home / Self Care | Payer: Medicaid Other | Source: Ambulatory Visit | Attending: Internal Medicine | Admitting: Internal Medicine

## 2018-08-18 ENCOUNTER — Telehealth (HOSPITAL_COMMUNITY): Payer: Self-pay | Admitting: Licensed Clinical Social Worker

## 2018-08-18 VITALS — BP 130/85 | HR 81 | Wt 194.2 lb

## 2018-08-18 DIAGNOSIS — Z79899 Other long term (current) drug therapy: Secondary | ICD-10-CM | POA: Insufficient documentation

## 2018-08-18 DIAGNOSIS — I472 Ventricular tachycardia, unspecified: Secondary | ICD-10-CM

## 2018-08-18 DIAGNOSIS — Z95810 Presence of automatic (implantable) cardiac defibrillator: Secondary | ICD-10-CM | POA: Diagnosis not present

## 2018-08-18 DIAGNOSIS — I130 Hypertensive heart and chronic kidney disease with heart failure and stage 1 through stage 4 chronic kidney disease, or unspecified chronic kidney disease: Secondary | ICD-10-CM | POA: Insufficient documentation

## 2018-08-18 DIAGNOSIS — G4700 Insomnia, unspecified: Secondary | ICD-10-CM | POA: Diagnosis not present

## 2018-08-18 DIAGNOSIS — N183 Chronic kidney disease, stage 3 (moderate): Secondary | ICD-10-CM | POA: Diagnosis not present

## 2018-08-18 DIAGNOSIS — G4733 Obstructive sleep apnea (adult) (pediatric): Secondary | ICD-10-CM | POA: Diagnosis not present

## 2018-08-18 DIAGNOSIS — I428 Other cardiomyopathies: Secondary | ICD-10-CM | POA: Diagnosis not present

## 2018-08-18 DIAGNOSIS — F329 Major depressive disorder, single episode, unspecified: Secondary | ICD-10-CM | POA: Diagnosis not present

## 2018-08-18 DIAGNOSIS — I2510 Atherosclerotic heart disease of native coronary artery without angina pectoris: Secondary | ICD-10-CM | POA: Diagnosis not present

## 2018-08-18 DIAGNOSIS — I519 Heart disease, unspecified: Secondary | ICD-10-CM | POA: Diagnosis not present

## 2018-08-18 DIAGNOSIS — I10 Essential (primary) hypertension: Secondary | ICD-10-CM | POA: Diagnosis not present

## 2018-08-18 DIAGNOSIS — M109 Gout, unspecified: Secondary | ICD-10-CM | POA: Insufficient documentation

## 2018-08-18 DIAGNOSIS — Z888 Allergy status to other drugs, medicaments and biological substances status: Secondary | ICD-10-CM | POA: Diagnosis not present

## 2018-08-18 DIAGNOSIS — Z8249 Family history of ischemic heart disease and other diseases of the circulatory system: Secondary | ICD-10-CM | POA: Diagnosis not present

## 2018-08-18 DIAGNOSIS — I5022 Chronic systolic (congestive) heart failure: Secondary | ICD-10-CM | POA: Diagnosis present

## 2018-08-18 LAB — BASIC METABOLIC PANEL
Anion gap: 11 (ref 5–15)
BUN: 10 mg/dL (ref 6–20)
CO2: 22 mmol/L (ref 22–32)
Calcium: 9.3 mg/dL (ref 8.9–10.3)
Chloride: 107 mmol/L (ref 98–111)
Creatinine, Ser: 0.96 mg/dL (ref 0.61–1.24)
GFR calc Af Amer: >60 mL/min (ref >60)
GFR calc non Af Amer: >60 mL/min (ref >60)
Glucose, Bld: 119 mg/dL — ABNORMAL HIGH (ref 70–99)
Potassium: 3.1 mmol/L — ABNORMAL LOW (ref 3.5–5.1)
Sodium: 140 mmol/L (ref 135–145)

## 2018-08-18 LAB — DIGOXIN LEVEL: Digoxin Level: 0.3 ng/mL — ABNORMAL LOW (ref 0.8–2.0)

## 2018-08-18 LAB — BRAIN NATRIURETIC PEPTIDE: B Natriuretic Peptide: 102.8 pg/mL — ABNORMAL HIGH (ref 0.0–100.0)

## 2018-08-18 MED ORDER — CARVEDILOL 25 MG PO TABS: 25.0000 mg | ORAL_TABLET | Freq: Two times a day (BID) | ORAL | 3 refills | Status: DC

## 2018-08-18 MED ORDER — SPIRONOLACTONE 25 MG PO TABS: 12.5000 mg | ORAL_TABLET | Freq: Every day | ORAL | 3 refills | Status: DC

## 2018-08-18 MED ORDER — DIGOXIN 125 MCG PO TABS: 0.1250 mg | ORAL_TABLET | Freq: Every day | ORAL | 3 refills | Status: DC

## 2018-08-18 NOTE — Telephone Encounter (Signed)
CSW consulted to reach out to pt regarding current concerns with depression.  Pt states he has been struggling with depression over the past 4 years.  Attributes a lot of his struggles to his brother death, medical concerns, and feeling as if he is a failure at this point in his life.  Pt expresses a lot of frustrations with not being able to do as much as he once was able to because of his medical concerns and feels like he no longer contributes.  Pt admits to being noncompliant with medications at times because of his depression and because he does not like the side effects of some of his medications.  CSW spoke with pt at length about his feelings and had strength based approach to his current situation.  Pt discussed that moving back to [facility] has been a big help to his mood as he is now around his children and grandchildren.  Pt reports he feels his best when he is caring for his grandchildren and does not feel depressed when he is with them.  He is motivated by his family to try and get a hold of his mental and physical health.  Pt also states he is very motivated by his music.  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  CSW encouraged him to think of other ways that he can incorporate music in his life since it is so important to him.  Pt is agreeable to getting help through counseling service but is unsure he would want to be on medication as he feels as if this can lead to you becoming more depressed.  CSW able to set pt up with intake appt for Monday 8/17 at 1pm with local medicaid counselor Newman Nickels- CSW left message for pt to inform of appt.  CSW will continue to follow and assist as needed  Jorge Ny, [facility] Desk#: [phone] Cell#: [phone]

## 2018-08-18 NOTE — Progress Notes (Signed)
ADVANCED HF CLINIC CONSULT NOTE  Referring Physician: Dr. Rayann Heman Primary Care: Primary Cardiologist: Dr. Rayann Heman   HPI:  Stanley [name] is a 53 y.o. male with a hx of NICM in 2006 (felt 2/2 HTN/ETOH), ? CAD s/p PCI to unknown vessel 2011 at outside location (cath 2014 without CAD and normal coronaries by CT 07/2016) eventually a [employer] Sci ICD implant done in [place] 2015, VT 2017, noncompliance, chronic chest pain of noncardiac source, depression w/ETOH abuse, HTN, gout who is being seen today for the evaluation of HF at the request of Dr. Rayann Heman.   In 2017 he received inappropriate therapy for ST with ATP (after a work out at [employer] and reported noncompliance with meds) that degenerated into VT and received several shocks. He has had issues with chronic chest pain without evidence for ischemic etiology including normal cath and cardiac CT as above. This is relatively unchanged. Echo 03/2016  EF 35-40%, grade 1 DD, mild MR, moderate LAE  Had recurrent ICD shock in 3/20 in setting of medicine noncompliance. Amio continued. Echo 3/20 EF 25-30% Normal RV  Says he chronic CP. Lives by himself. Gets SOB with doing ADLs. Has to stop frequently. No edema, orthopnea or PND. Weight up/down 181-186. Checking BP on occasion and SBP 135 -149. No ICD firings. Compliant with medicines. Has CPAP on order. Feels depressed. Drinking one 40 per day. No cigs. No cocaine.     Review of Systems: [y] = yes, [ ]  = no   General: Weight gain Blue.Reese ]; Weight loss [ ] ; Anorexia [ ] ; Fatigue Blue.Reese ]; Fever [ ] ; Chills [ ] ; Weakness [ ]   Cardiac: Chest pain/pressure [ y]; Resting SOB [ ] ; Exertional SOB [ y]; Orthopnea [ ] ; Pedal Edema [ ] ; Palpitations [ ] ; Syncope [ ] ; Presyncope [ ] ; Paroxysmal nocturnal dyspnea[ ]   Pulmonary: Cough [ ] ; Wheezing[ ] ; Hemoptysis[ ] ; Sputum [ ] ; Snoring [ ]   GI: Vomiting[ ] ; Dysphagia[ ] ; Melena[ ] ; Hematochezia [ ] ; Heartburn[ ] ; Abdominal pain [ ] ; Constipation [ ] ; Diarrhea [ ] ; BRBPR [ ]    GU: Hematuria[ ] ; Dysuria [ ] ; Nocturia[ ]   Vascular: Pain in legs with walking [ ] ; Pain in feet with lying flat [ ] ; Non-healing sores [ ] ; Stroke [ ] ; TIA [ ] ; Slurred speech [ ] ;  Neuro: Headaches[ ] ; Vertigo[ ] ; Seizures[ ] ; Paresthesias[ ] ;Blurred vision [ ] ; Diplopia [ ] ; Vision changes [ ]   Ortho/Skin: Arthritis [ y]; Joint pain [ y]; Muscle pain [ ] ; Joint swelling [ ] ; Back Pain [ ] ; Rash [ ]   Psych: Depression[ y]; Anxiety[ ]   Heme: Bleeding problems [ ] ; Clotting disorders [ ] ; Anemia [ ]   Endocrine: Diabetes [ ] ; Thyroid dysfunction[ ]    Past Medical History:  Diagnosis Date  . Acute renal failure (ARF) ([place])   . CAD (coronary artery disease)    a. dx unclear, reported PCI in 2011 but cath 2014 normal coronaries and cardiac CT 07/2016 normal coronaries, no mention of stents.  . CHF (congestive heart failure) ([place])   . Gout   . Gouty arthritis   . Hypertension   . ICD (implantable cardioverter-defibrillator) in place   . Insomnia   . Nonischemic cardiomyopathy ([place])   . Ventricular tachycardia ([place]) 01/2015   ATP delivered for sinus tach, degenerated into VT for which he received shock    Current Outpatient Medications  Medication Sig Dispense Refill  . allopurinol (ZYLOPRIM) 100 MG tablet Take 2 tablets (200 mg total)  by mouth daily. 60 tablet 3  . amiodarone (PACERONE) 200 MG tablet Take 1 tablet (200 mg total) by mouth daily. 90 tablet 3  . atorvastatin (LIPITOR) 40 MG tablet Take 1 tablet (40 mg total) by mouth daily. 90 tablet 3  . bismuth subsalicylate (PEPTO BISMOL) 262 MG/15ML suspension Take 30 mLs by mouth every 6 (six) hours as needed for indigestion.    . busPIRone (BUSPAR) 10 MG tablet Take 1 tablet (10 mg total) by mouth 3 (three) times daily. (Patient taking differently: Take 10 mg by mouth daily. ) 60 tablet 1  . carvedilol (COREG) 25 MG tablet Take 1 tablet (25 mg total) by mouth 2 (two) times daily with a meal. 180 tablet 3  . digoxin (LANOXIN) 0.125 MG  tablet Take 1 tablet (0.125 mg total) by mouth daily. 90 tablet 3  . furosemide (LASIX) 80 MG tablet Take 1 tablet (80 mg total) by mouth daily. 90 tablet 3  . ibuprofen (ADVIL,MOTRIN) 200 MG tablet Take 200 mg by mouth every 6 (six) hours as needed (gout).    Marland Kitchen losartan (COZAAR) 50 MG tablet Take 1 tablet (50 mg total) by mouth daily. 90 tablet 3  . nitroGLYCERIN (NITROSTAT) 0.4 MG SL tablet Place 1 tablet (0.4 mg total) under the tongue every 5 (five) minutes x 3 doses as needed for chest pain. 30 tablet 6  . potassium chloride SA (K-DUR,KLOR-CON) 20 MEQ tablet Take 1 tablet (20 mEq total) by mouth daily. 90 tablet 3  . traZODone (DESYREL) 150 MG tablet Take 1 tablet (150 mg total) by mouth at bedtime. MUST MAKE APPT FOR FURTHER REFILLS 30 tablet 0   No current facility-administered medications for this encounter.     Allergies  Allergen Reactions  . Lisinopril Shortness Of Breath  . Apple Swelling    Lip Swelling  . Other Swelling    Nuts - Lip Swelling  . Shellfish Allergy Swelling    Lip Swelling      Social History   Socioeconomic History  . Marital status: Divorced    Spouse name: Not on file  . Number of children: 1  . Years of education: 72  . Highest education level: Not on file  Occupational History  . Occupation: Disability  Social Needs  . Financial resource strain: Somewhat hard  . Food insecurity    Worry: Sometimes true    Inability: Sometimes true  . Transportation needs    Medical: No    Non-medical: No  Tobacco Use  . Smoking status: Never Smoker  . Smokeless tobacco: Never Used  Substance and Sexual Activity  . Alcohol use: Not Currently    Alcohol/week: 0.0 standard drinks  . Drug use: No  . Sexual activity: Not Currently  Lifestyle  . Physical activity    Days per week: Patient refused    Minutes per session: Patient refused  . Stress: To some extent  Relationships  . Social Herbalist on phone: Patient refused    Gets together:  Patient refused    Attends religious service: Patient refused    Active member of club or organization: Patient refused    Attends meetings of clubs or organizations: Patient refused    Relationship status: Patient refused  . Intimate partner violence    Fear of current or ex partner: No    Emotionally abused: No    Physically abused: No    Forced sexual activity: No  Other Topics Concern  . Not on  file  Social History Narrative   Lives in [place] alone.  Disabled.  Previously worked as a Careers adviser.   Fun: Rest, Read and watch movies.       Family History  Problem Relation Age of Onset  . Cancer Mother        Pancreatic  . Hypertension Father   . Alzheimer's disease Father   . Gout Father   . Dementia Father   . Hypertension Sister   . Clotting disorder Sister   . Obesity Brother   . Bronchitis Brother   . Heart disease Maternal Grandmother   . Gout Paternal Grandfather     Vitals:   08/18/18 0924  BP: 130/85  Pulse: 81  SpO2: 97%  Weight: 88.1 kg (194 lb 3.2 oz)    PHYSICAL EXAM: General:  Well appearing. No respiratory difficulty HEENT: normal Neck: supple. no JVD. Carotids 2+ bilat; no bruits. No lymphadenopathy or thryomegaly appreciated. Cor: PMI nondisplaced. Regular rate & rhythm. No rubs, gallops or murmurs. Lungs: clear Abdomen: soft, nontender, nondistended. No hepatosplenomegaly. No bruits or masses. Good bowel sounds. Extremities: no cyanosis, clubbing, rash, edema Neuro: alert & oriented x 3, cranial nerves grossly intact. moves all 4 extremities w/o difficulty. Affect pleasant.  ECG 03/22/18: ST 108 IVCD 131ms Personally reviewed    ASSESSMENT & PLAN:  1. Chronic systolic heart failure - Mostly NICM - EF 25-30% echo 3/20 - NYHA III - Volume status ok - Continue carvedilol 25 bid - Continue losartan 50 daily - Continue digoxin 0.125 daily - add spiro 12.5 - No ARNI with angioedema with ACE-I - CPX test to get objective  assessment - Long talk about possible need for advanced therapies  2. VT - quiescent on amio - followed by Dr. Rayann Heman - No VT on ICD on personal interrogation today  3. HTN - mildly elevated. Add spiro 12.5  4. OSA - encouraged getting on CPAP  5. CKD 3 - creatinine 1.3-1.6 - recheck today  6. CAD  - h/o single vessel PCI. Last cath 2011 - continue ASA/statin  7. Depession - h/o suicidal ideation - brother killed in 2014 - feels like he has been a failure.  - Needs a counselor. Will refer to SW  8. ETOH - likely related to depression - discuss need for cessation    Glori Bickers, MD  9:52 AM

## 2018-08-18 NOTE — Patient Instructions (Addendum)
Start Spironolactone 12.5 mg (1/2 tab) daily  Labs done today, we will contact you if abnormal  Your physician has recommended that you have a cardiopulmonary stress test (CPX). CPX testing is a non-invasive measurement of heart and lung function. It replaces a traditional treadmill stress test. This type of test provides a tremendous amount of information that relates not only to your present condition but also for future outcomes. This test combines measurements of you ventilation, respiratory gas exchange in the lungs, electrocardiogram (EKG), blood pressure and physical response before, during, and following an exercise protocol.  Eliezer Lofts, our Education officer, museum will contact you later today  Please follow up with our heart failure pharmacist in 2 weeks  Your physician recommends that you schedule a follow-up appointment in: 2 months  At the [facility], you and your health needs are our priority. As part of our continuing mission to provide you with exceptional heart care, we have created designated Provider Care Teams. These Care Teams include your primary Cardiologist (physician) and Advanced Practice Providers (APPs- Physician Assistants and Nurse Practitioners) who all work together to provide you with the care you need, when you need it.   You may see any of the following providers on your designated Care Team at your next follow up: Marland Kitchen Dr Glori Bickers . Dr Loralie Champagne . Darrick Grinder, NP   Please be sure to bring in all your medications bottles to every appointment.

## 2018-08-19 ENCOUNTER — Ambulatory Visit: Payer: Medicaid Other | Attending: Nurse Practitioner | Admitting: Nurse Practitioner

## 2018-08-22 ENCOUNTER — Other Ambulatory Visit: Payer: Self-pay | Admitting: Nurse Practitioner

## 2018-08-22 ENCOUNTER — Telehealth (HOSPITAL_COMMUNITY): Payer: Self-pay

## 2018-08-22 ENCOUNTER — Telehealth (HOSPITAL_COMMUNITY): Payer: Self-pay | Admitting: Licensed Clinical Social Worker

## 2018-08-22 DIAGNOSIS — F5104 Psychophysiologic insomnia: Secondary | ICD-10-CM

## 2018-08-22 NOTE — Telephone Encounter (Signed)
LVM regarding med changes and repeat lab work

## 2018-08-22 NOTE — Telephone Encounter (Signed)
CSW called to check how intake appt went today- pt reports he had not received a call.    CSW called Corena Herter and had intake appt rescheduled for Wednesday at 2pm- CSW confirmed appt with patient and he reports he will be available.  CSW will continue to follow and assist as needed  Jorge Ny, [facility] Desk#: [phone] Cell#: [phone]

## 2018-08-22 NOTE — Telephone Encounter (Signed)
-----   Message from Jolaine Artist, MD sent at 08/20/2018 11:00 PM EDT ----- K low. Please give kcl 66meq x 1. Arlyce Harman added at last visit so should help. Please repeat bmet 1 week.

## 2018-08-26 ENCOUNTER — Encounter (HOSPITAL_COMMUNITY): Payer: Self-pay

## 2018-08-26 ENCOUNTER — Other Ambulatory Visit (HOSPITAL_COMMUNITY): Payer: Self-pay

## 2018-08-26 DIAGNOSIS — I519 Heart disease, unspecified: Secondary | ICD-10-CM

## 2018-08-26 NOTE — Telephone Encounter (Signed)
-----   Message from Daniel R Bensimhon, MD sent at 08/20/2018 11:00 PM EDT ----- K low. Please give kcl 40meq x 1. Spiro added at last visit so should help. Please repeat bmet 1 week. 

## 2018-08-26 NOTE — Telephone Encounter (Signed)
Left 3rd VM, stated I would send MyChart message and mail letter of medication changes. Has Pharm appt 8/27, stated I would add lab appt for repat BMET then.

## 2018-08-29 ENCOUNTER — Telehealth (HOSPITAL_COMMUNITY): Payer: Self-pay | Admitting: Internal Medicine

## 2018-08-29 NOTE — Telephone Encounter (Signed)
Called and left message for patient to call to schedule CPX with me.

## 2018-08-31 NOTE — Telephone Encounter (Signed)
Spoke with patient, he is scheduled for CPX on 09/19/2018.

## 2018-09-01 ENCOUNTER — Ambulatory Visit (HOSPITAL_COMMUNITY)
Admission: RE | Admit: 2018-09-01 | Discharge: 2018-09-01 | Disposition: A | Discharge status: Home / Self Care | Payer: Medicaid Other | Source: Ambulatory Visit | Attending: Cardiology | Admitting: Cardiology

## 2018-09-01 ENCOUNTER — Other Ambulatory Visit: Payer: Self-pay

## 2018-09-01 ENCOUNTER — Telehealth (HOSPITAL_COMMUNITY): Payer: Self-pay | Admitting: Licensed Clinical Social Worker

## 2018-09-01 VITALS — BP 160/90 | HR 80 | Ht 65.0 in | Wt 190.0 lb

## 2018-09-01 DIAGNOSIS — I519 Heart disease, unspecified: Secondary | ICD-10-CM | POA: Diagnosis present

## 2018-09-01 LAB — BASIC METABOLIC PANEL
Anion gap: 13 (ref 5–15)
BUN: 11 mg/dL (ref 6–20)
CO2: 22 mmol/L (ref 22–32)
Calcium: 9.3 mg/dL (ref 8.9–10.3)
Chloride: 104 mmol/L (ref 98–111)
Creatinine, Ser: 1.03 mg/dL (ref 0.61–1.24)
GFR calc Af Amer: >60 mL/min (ref >60)
GFR calc non Af Amer: >60 mL/min (ref >60)
Glucose, Bld: 121 mg/dL — ABNORMAL HIGH (ref 70–99)
Potassium: 3 mmol/L — ABNORMAL LOW (ref 3.5–5.1)
Sodium: 139 mmol/L (ref 135–145)

## 2018-09-01 MED ORDER — BIDIL 20-37.5 MG PO TABS: 1.0000 | ORAL_TABLET | Freq: Three times a day (TID) | ORAL | 3 refills | Status: DC

## 2018-09-01 MED ORDER — SPIRONOLACTONE 25 MG PO TABS: 25.0000 mg | ORAL_TABLET | Freq: Every day | ORAL | 3 refills | Status: DC

## 2018-09-01 NOTE — Patient Instructions (Signed)
Increase Spironolatone to 1 whole tablet each day = 25mg  Start BiDil 1 tablet 3 times a day Take potassium tablet 2 times a day for next 3 days  Continue to eat low salt food Continue to weigh each day  Follow up with HF pharmacist 9/21 at 2pm

## 2018-09-01 NOTE — Telephone Encounter (Signed)
CSW called pt to check how counseling intake patient went last week.  Pt states that he has changed his mind about receiving counseling at this time and plans to work through his depression on his own.  Pt states he is focusing on his grandchildren which makes him happy and will plan to stay busy.  Pt states he will reach back out to CSW if he changes his mind and wants to reconsider counseling.  Jorge Ny, LCSW Clinical Social Worker Advanced [facility] Clinic Desk#: [phone] Cell#: [phone]

## 2018-09-01 NOTE — Progress Notes (Signed)
Referring Physician: Dr. Rayann Heman Primary Care: Primary Cardiologist: Dr. Rayann Heman  HF Physician: Dr. [name]   HPI:   Stanley [name] a 53 y.o.malewith a hx of NICM in 2006 (felt 2/2 HTN/ETOH), ? CAD s/p PCI to unknown vessel 2011 at outside location (cath 2014 without CAD and normal coronaries by CT 07/2016)eventually a[employer] SciICD implant done in [place] 2015, VT 2017,noncompliance, chronic chest pain of noncardiac source,depression w/ETOH abuse, HTN, goutwho is being seen today for the evaluation of HF at the request of Dr. Rayann Heman.   In 2017 he received inappropriatetherapyfor ST with ATP (after a work out at [employer] and reported noncompliance with meds) that degenerated into VT and received several shocks. He has had issues with chronic chest pain without evidence for ischemic etiologyincluding normal cath and cardiac CT as above. This is relatively unchanged.Echo 03/2016  EF 35-40%, grade 1 DD, mild MR, moderate LAE  Had recurrent ICD shock in 3/20 in setting of medicine noncompliance. Amio continued. Echo 3/20 EF 25-30% Normal RV  Says he has chronic CP. Lives by himself. Gets SOB with doing ADLs. Has to stop frequently. No edema, orthopnea or PND. Weight up/down 181-186. Checking BP on occasion and SBP 135 -149. No ICD firings. Compliant with medicines. Has CPAP on order. Feels depressed. Drinking one 40 per day. No cigs. No cocaine.    Today returns to HF clinic for pharmacist medication titration.  Continue to be SOB with little exertion. Stops to catch breath during ADLS, stops to lean on cart while grocery shopping. States compliance with medications.   ReDS 27%    . Shortness of breath/dyspnea on exertion? yes  . Orthopnea/PND? Yes - sleeps on 4 pillows - plans for CPAP - hasn't started yet . Edema? no . Lightheadedness/dizziness? no . Daily weights at home? yes . Blood pressure/heart rate monitoring at home? no . Following low-sodium/fluid-restricted diet?  yes  HF Medications: Carvedilol 25mg  BID Digoxin 0.125mg  Daily Furosemide 80mg  Daily Potassium 20MEQ Daily Losartan 50mg  Daily Spironolactone 12.5mg  Daily  Has the patient been experiencing any side effects to the medications prescribed?  no  Does the patient have any problems obtaining medications due to transportation or finances?   no  Understanding of regimen: good Understanding of indications: good Potential of compliance: fair Patient understands to avoid NSAIDs. - discussed Patient understands to avoid decongestants yes.    Pertinent Lab Values: 09/01/18 . Serum creatinine 1.03, BUN 11 Potassium 3.0 Sodium 139, BNP 102 08/18/18, Digoxin 0.3 08/18/18   Vital Signs: . Weight: 190lb (dry weight: 185-190lb per pt report) . Blood pressure: 160/90 . Heart rate: 80 . O2 98% RA . ReDS 27%   Assessment: 1. Chronic systolic CHF (EF [id number]), due to NICM. NYHA class III symptoms. Discussed approach to medication to improve pump function and medication goals to help achieve this.  Stressed compliance -Volume status stable no peripheral edema, ReDS reading 27% -BP elevated today - states compliance with medication  -renal function stable, K slightly low, last digoxin level at goal < 0.9 Based on above, will continue carvedilol 25mg  BID,and losartan 50mg  Daily,  -will increase Spironolactone 25mg  Daily and add BiDil 1 tab TID, increase Potasium 40MEQ x 3 days for low K No ARNI with angioedema with ACE-I  - Basic disease state pathophysiology, medication indication, mechanism and side effects reviewed at length with patient and he verbalized understanding   2. VT - quiescent on amiodarone - stressed complinace - followed by Dr. Rayann Heman - No VT on ICD on  personal interrogation today  3. HTN -elevated. increased spironolactone 25mg  daily and added BilDil 1 tab TID  4. OSA - encouraged getting on CPAP  5. CKD 3 - creatinine stable - rechecked today  6. CAD  - h/o single  vessel PCI. Last cath 2011 - continue statin  7. Depession - h/o suicidal ideation - brother killed in 2014 - followed by SW  8. ETOH - likely related to depression -previously discuss need for cessation      Plan: 1) Medication changes: Based on clinical presentation, vital signs and recent labs will replace K and increase KCL 40MEQ daily for 3 days and then resume 20 MEQ daily, increase spironolactone 25mg  Daily, add BiDil 1 tab TID 2) Labs: recheck at next visit in 2 weeks 3) Follow-up: Pharmacy clinic 2 weeks    Bonnita Nasuti Pharm.D. CPP, BCPS Clinical Pharmacist [phone] 09/10/2018 9:32 AM

## 2018-09-06 ENCOUNTER — Encounter: Payer: Medicaid Other | Admitting: *Deleted

## 2018-09-08 ENCOUNTER — Telehealth (HOSPITAL_COMMUNITY): Payer: Self-pay

## 2018-09-08 NOTE — Telephone Encounter (Signed)
Called pt to schedule covid test pre cpx. Left voicemail for call back.

## 2018-09-09 NOTE — Telephone Encounter (Signed)
Called pt again to schedule covid test for cpx. No answer. Will call again next week.

## 2018-09-13 ENCOUNTER — Other Ambulatory Visit: Payer: Self-pay | Admitting: Family Medicine

## 2018-09-13 DIAGNOSIS — F5104 Psychophysiologic insomnia: Secondary | ICD-10-CM

## 2018-09-13 NOTE — Telephone Encounter (Signed)
Pt called back he is scheduled for covid screening on 9/11 at 2:40pm

## 2018-09-16 ENCOUNTER — Inpatient Hospital Stay (HOSPITAL_COMMUNITY): Admission: RE | Admit: 2018-09-16 | Payer: Medicaid Other | Source: Ambulatory Visit

## 2018-09-16 ENCOUNTER — Other Ambulatory Visit (HOSPITAL_COMMUNITY)
Admission: RE | Admit: 2018-09-16 | Discharge: 2018-09-16 | Disposition: A | Discharge status: Home / Self Care | Payer: Medicaid Other | Source: Ambulatory Visit | Attending: Internal Medicine | Admitting: Internal Medicine

## 2018-09-16 DIAGNOSIS — Z01812 Encounter for preprocedural laboratory examination: Secondary | ICD-10-CM | POA: Insufficient documentation

## 2018-09-16 DIAGNOSIS — Z20828 Contact with and (suspected) exposure to other viral communicable diseases: Secondary | ICD-10-CM | POA: Diagnosis not present

## 2018-09-16 LAB — SARS CORONAVIRUS 2 (TAT 6-24 HRS): SARS Coronavirus 2: NEGATIVE

## 2018-09-17 ENCOUNTER — Other Ambulatory Visit (HOSPITAL_COMMUNITY): Payer: Medicare Other

## 2018-09-19 ENCOUNTER — Ambulatory Visit (HOSPITAL_COMMUNITY): Payer: Medicaid Other | Attending: Internal Medicine

## 2018-09-19 ENCOUNTER — Other Ambulatory Visit: Payer: Self-pay

## 2018-09-19 ENCOUNTER — Other Ambulatory Visit (HOSPITAL_COMMUNITY): Payer: Self-pay | Admitting: *Deleted

## 2018-09-19 DIAGNOSIS — I519 Heart disease, unspecified: Secondary | ICD-10-CM

## 2018-09-19 DIAGNOSIS — I5022 Chronic systolic (congestive) heart failure: Secondary | ICD-10-CM

## 2018-09-21 NOTE — Telephone Encounter (Addendum)
Reached out to the patient and he states he never received his cpap because his insurance will not pay for a cpap. Patient was offered CPAP ASSISTANCE but he declined due to finances.

## 2018-09-26 ENCOUNTER — Other Ambulatory Visit: Payer: Self-pay

## 2018-09-26 ENCOUNTER — Ambulatory Visit (HOSPITAL_COMMUNITY)
Admission: RE | Admit: 2018-09-26 | Discharge: 2018-09-26 | Disposition: A | Discharge status: Home / Self Care | Payer: Medicaid Other | Source: Ambulatory Visit | Attending: Internal Medicine | Admitting: Internal Medicine

## 2018-09-26 VITALS — BP 182/104 | HR 82 | Wt 190.6 lb

## 2018-09-26 DIAGNOSIS — R9431 Abnormal electrocardiogram [ECG] [EKG]: Secondary | ICD-10-CM | POA: Diagnosis not present

## 2018-09-26 DIAGNOSIS — I519 Heart disease, unspecified: Secondary | ICD-10-CM | POA: Diagnosis present

## 2018-09-26 DIAGNOSIS — F5104 Psychophysiologic insomnia: Secondary | ICD-10-CM

## 2018-09-26 DIAGNOSIS — I472 Ventricular tachycardia, unspecified: Secondary | ICD-10-CM

## 2018-09-26 DIAGNOSIS — F329 Major depressive disorder, single episode, unspecified: Secondary | ICD-10-CM

## 2018-09-26 DIAGNOSIS — F32A Depression, unspecified: Secondary | ICD-10-CM

## 2018-09-26 LAB — BASIC METABOLIC PANEL
Anion gap: 13 (ref 5–15)
BUN: 12 mg/dL (ref 6–20)
CO2: 19 mmol/L — ABNORMAL LOW (ref 22–32)
Calcium: 9.1 mg/dL (ref 8.9–10.3)
Chloride: 106 mmol/L (ref 98–111)
Creatinine, Ser: 0.98 mg/dL (ref 0.61–1.24)
GFR calc Af Amer: >60 mL/min (ref >60)
GFR calc non Af Amer: >60 mL/min (ref >60)
Glucose, Bld: 92 mg/dL (ref 70–99)
Potassium: 3.7 mmol/L (ref 3.5–5.1)
Sodium: 138 mmol/L (ref 135–145)

## 2018-09-26 MED ORDER — BUSPIRONE HCL 10 MG PO TABS: 10.0000 mg | ORAL_TABLET | Freq: Every day | ORAL | 1 refills | Status: DC

## 2018-09-26 MED ORDER — DIGOXIN 125 MCG PO TABS: 0.1250 mg | ORAL_TABLET | Freq: Every day | ORAL | 3 refills | Status: DC

## 2018-09-26 MED ORDER — TRAZODONE HCL 150 MG PO TABS: 150.0000 mg | ORAL_TABLET | Freq: Every day | ORAL | 0 refills | Status: DC

## 2018-09-26 NOTE — Patient Instructions (Addendum)
It was a pleasure seeing you today!  MEDICATIONS: -We are changing your medications today -Increase spironolactone to 25 mg (1 tab) daily -Start taking Bidil (hydralazine/isosorbide dinitrate) 1 tab three times daily.  -Call if you have questions about your medications.  LABS: -We will call you if your labs need attention.  NEXT APPOINTMENT: Return to clinic in 3 weeks with Dr. [name].  In general, to take care of your heart failure: -Limit your fluid intake to 2 Liters (half-gallon) per day.   -Limit your salt intake to ideally 2-3 grams (2000-3000 mg) per day. -Weigh yourself daily and record, and bring that "weight diary" to your next appointment.  (Weight gain of 2-3 pounds in 1 day typically means fluid weight.) -The medications for your heart are to help your heart and help you live longer.   -Please contact us before stopping any of your heart medications.  Call the clinic at [phone] with questions or to reschedule future appointments.

## 2018-09-28 ENCOUNTER — Ambulatory Visit (INDEPENDENT_AMBULATORY_CARE_PROVIDER_SITE_OTHER): Payer: Medicaid Other | Admitting: *Deleted

## 2018-09-28 DIAGNOSIS — I472 Ventricular tachycardia, unspecified: Secondary | ICD-10-CM

## 2018-09-29 LAB — CUP PACEART REMOTE DEVICE CHECK
Battery Remaining Longevity: 138 mo
Battery Remaining Percentage: 100 %
Brady Statistic RV Percent Paced: 0 %
Date Time Interrogation Session: 20200923181700
HighPow Impedance: 57 Ohm
Implantable Lead Implant Date: 20150601
Implantable Lead Location: 753860
Implantable Lead Model: 295
Implantable Lead Serial Number: 134196
Implantable Pulse Generator Implant Date: 20150601
Lead Channel Impedance Value: 448 Ohm
Lead Channel Pacing Threshold Amplitude: 1.5 V
Lead Channel Pacing Threshold Pulse Width: 0.4 ms
Lead Channel Setting Pacing Amplitude: 2.5 V
Lead Channel Setting Pacing Pulse Width: 0.4 ms
Lead Channel Setting Sensing Sensitivity: 0.6 mV
Pulse Gen Serial Number: 190689

## 2018-09-29 NOTE — Progress Notes (Signed)
Primary Cardiologist: Dr. Rayann Heman  HF Cardiologist: Dr. [name]  HPI:  Stanley [name] a 53 y.o.malewith a hx of NICM in 2006 (felt 2/2 HTN/ETOH), ?CAD s/p PCI to unknown vessel 2011 at outside location (cath 2014 without CAD and normal coronaries by CT 07/2016)eventually [place] Sci[place] done in [place] 2015, VT 2017,noncompliance, chronic chest pain of noncardiac source,depression w/ETOH abuse, HTN, and gout.   In 2017 he received inappropriatetherapyfor ST with ATP (after a work out at [employer] and reported noncompliance with meds) that degenerated into VT and received several shocks. He has had issues with chronic chest pain without evidence for ischemic etiologyincluding normal cath and cardiac CT as above. This is relatively unchanged.Echo 03/2016  EF 35-40%, grade 1 DD, mild MR, moderate LAE  Had recurrent ICD shock in 3/20 in setting of medicine noncompliance. Amio continued. Echo 3/20 EF 25-30% Normal RV.  Recently referred to HF clinic with Dr. [name] on 08/18/2018. He endorsed chronic CP. Lives by himself. He reported SOB with doing ADLs and has to stop frequently. No edema, orthopnea or PND. Weight up/down 181-186. He reported checking BP on occasion and SBP 135 -149. No ICD firings. Compliant with medicines. Has CPAP on order. Feels depressed. Drinking one 40 per day. No cigs or cocaine. At that visit, spironolactone 12.5 mg daily was initiated.   Recent visit to HF clinic for pharmacist medication titration.  Continued to have SOB with little exertion. He reported stopping to catch his breath during ADLs, and must lean on cart while grocery shopping. Stated compliance with medications.  ReDS reading was 27%.  Today he returns to HF clinic for pharmacist medication titration. At last pharmacy visit, spironolactone was increased to 25 mg daily and BiDil 1 tablet TID was added. However, he did not start Bidil because of cost (states not an issue anymore) and he did not  increase spironolactone. Overall, he reports doing well today. He enjoyed having his five grandchildren over this weekend and that keeps his mood up. No dizziness or lightheadedness. Does have some fatigue when he over-exerts himself.  He has chronic chest pain, but believes this is better than previous months. He last used prn SL NTG two weeks ago. His breathing is "not great". He can only go approximately 100 feet before becoming short of breath. However, he has been trying to walk 10-15 minutes every day. His weight at home is stable, anywhere from 186-188 lbs. He takes furosemide 80 mg daily and has not needed any extra. No lower extremity edema, lungs free from crackles, no JVD. No PND but sleeps on 3-4 pillows (stable). He qualified for CPAP but received notice that his insurance would not pay for it. His appetite is good. He stopped drinking ETOH last month.      . Shortness of breath/dyspnea on exertion? yes  . Orthopnea/PND? Yes - 3-4 pillows, stable . Edema? no . Lightheadedness/dizziness? no . Daily weights at home? yes . Blood pressure/heart rate monitoring at home? Yes - reports SBP 150-160 . Following low-sodium/fluid-restricted diet? yes  HF Medications: Carvedilol 25 mg twice daily Losartan 50 mg daily Spironolactone 12.5 mg daily  Bidil 1 tablet TID - did not start Digoxin 0.125 mg daily Furosemide 80 mg daily Potassium 20 mEq daily  Has the patient been experiencing any side effects to the medications prescribed?  no  Does the patient have any problems obtaining medications due to transportation or finances?   No - Has Medicaid.   Understanding of regimen: fair Understanding  of indications: fair Potential of compliance: fair - history of noncompliance Patient understands to avoid NSAIDs. Patient understands to avoid decongestants.    Pertinent Lab Values: . Serum creatinine 0.98, BUN 12, Potassium 3.7, Sodium 138, Digoxin 0.3 ng/mL (08/18/2018)   Vital  Signs: . Weight: 190.6 lbs (last clinic weight: 190 lbs) . Blood pressure: 182/104  . Heart rate: 82   Assessment: 1. Chronic systolic heart failure - Mostly NICM - EF 25-30% echo 3/20  - CPX 09/19/18: moderate-severe HF limitation. Peak VO2 14.9 (IBW adjusted peak VO2 18.4), VE/VCO2 slope 31.  - NYHA III - Volume status ok - Labs reviewed in clinic: Scr normal 0.98, K 3.7 - BP in clinic elevated at 182/104. EKG showed NSR with LBBB (reviewed with Dr. [name]). He did not start Bidil or increase spironolactone as directed. First stated he was taking Bidil only once daily, then later stated he did not pick it up due to cost. Medicaid covers Bidil so he stated cost would no longer be a problem. He will pick it up today.  - Continue furosemide 80 mg daily - Continue carvedilol 25 mg bid - Continue losartan 50 daily. No ARNI with angioedema with ACE-I. - Increase spironolactone to 25 mg daily (he did not increase this medication as directed last visit). BMET at next visit.  - Start Bidil 1 tablet TID. If tolerated, consider uptitrating next visit.  - Continue digoxin 0.125 daily - Possibility of advanced therapies in the future. - Has been discussed with him by Dr. [name].   2. VT - quiescent on amio - followed by Dr. Rayann Heman - No VT on ICD on last interrogation 09/05/18. NSR today.   3. HTN - Elevated  - Start Bidil and increase spironolactone as above.  - Continue carvedilol, losartan.   4. OSA - Dr [name] has encouraged CPAP for him. Stated his insurance will not pay for it.   5. CKD 3 - creatinine 1.3-1.6 - Scr 0.98 today  6. CAD  - h/o single vessel PCI. Last cath 2011 - continue ASA/statin  7. Depession - h/o suicidal ideation - brother killed in 2014 - feels like he has been a failure.  - Spoke with SW and he feels like he does not need a Social worker at this time. He grandchildren help keep his mood up. -Has discontinued escitalopram 20 mg  8. ETOH -  likely related to depression - discussed need for cessation - stated he stopped last month (08/2018)   Plan: 1) Medication changes: Based on clinical presentation, vital signs and recent labs will Start Bidil 1 tab TID and increase spironolactone to 25 mg daily.  2) Labs: normal, Scr 0.98, K 3.7 3) Follow-up: Dr. [name] in 3 weeks   Audry Riles, PharmD, BCPS, CPP Heart Failure Clinic Pharmacist [phone]  Patient reviewed and discussed with Dr. Lynelle Smoke.  Agree with assessment and plan.   Bonnita Nasuti Pharm.D. CPP, BCPS Clinical Pharmacist [phone] 09/29/2018 2:28 PM

## 2018-10-03 ENCOUNTER — Ambulatory Visit: Payer: Medicaid Other | Admitting: Cardiology

## 2018-10-04 ENCOUNTER — Encounter: Payer: Self-pay | Admitting: Cardiology

## 2018-10-04 NOTE — Progress Notes (Signed)
Remote ICD transmission.   

## 2018-10-18 ENCOUNTER — Ambulatory Visit (HOSPITAL_COMMUNITY)
Admission: RE | Admit: 2018-10-18 | Discharge: 2018-10-18 | Disposition: A | Discharge status: Home / Self Care | Payer: Medicaid Other | Source: Ambulatory Visit | Attending: Internal Medicine | Admitting: Internal Medicine

## 2018-10-18 ENCOUNTER — Other Ambulatory Visit: Payer: Self-pay

## 2018-10-18 ENCOUNTER — Encounter (HOSPITAL_COMMUNITY): Payer: Self-pay | Admitting: Internal Medicine

## 2018-10-18 ENCOUNTER — Telehealth (HOSPITAL_COMMUNITY): Payer: Self-pay

## 2018-10-18 VITALS — BP 118/82 | HR 79 | Wt 182.8 lb

## 2018-10-18 DIAGNOSIS — Z800 Family history of malignant neoplasm of digestive organs: Secondary | ICD-10-CM | POA: Diagnosis not present

## 2018-10-18 DIAGNOSIS — Z8249 Family history of ischemic heart disease and other diseases of the circulatory system: Secondary | ICD-10-CM | POA: Insufficient documentation

## 2018-10-18 DIAGNOSIS — I5022 Chronic systolic (congestive) heart failure: Secondary | ICD-10-CM | POA: Diagnosis present

## 2018-10-18 DIAGNOSIS — Z91013 Allergy to seafood: Secondary | ICD-10-CM | POA: Insufficient documentation

## 2018-10-18 DIAGNOSIS — Z9861 Coronary angioplasty status: Secondary | ICD-10-CM | POA: Insufficient documentation

## 2018-10-18 DIAGNOSIS — I130 Hypertensive heart and chronic kidney disease with heart failure and stage 1 through stage 4 chronic kidney disease, or unspecified chronic kidney disease: Secondary | ICD-10-CM | POA: Insufficient documentation

## 2018-10-18 DIAGNOSIS — Z888 Allergy status to other drugs, medicaments and biological substances status: Secondary | ICD-10-CM | POA: Diagnosis not present

## 2018-10-18 DIAGNOSIS — I2510 Atherosclerotic heart disease of native coronary artery without angina pectoris: Secondary | ICD-10-CM | POA: Diagnosis not present

## 2018-10-18 DIAGNOSIS — Z7982 Long term (current) use of aspirin: Secondary | ICD-10-CM | POA: Diagnosis not present

## 2018-10-18 DIAGNOSIS — I472 Ventricular tachycardia, unspecified: Secondary | ICD-10-CM

## 2018-10-18 DIAGNOSIS — Z915 Personal history of self-harm: Secondary | ICD-10-CM | POA: Insufficient documentation

## 2018-10-18 DIAGNOSIS — Z95810 Presence of automatic (implantable) cardiac defibrillator: Secondary | ICD-10-CM | POA: Diagnosis not present

## 2018-10-18 DIAGNOSIS — G4733 Obstructive sleep apnea (adult) (pediatric): Secondary | ICD-10-CM | POA: Diagnosis not present

## 2018-10-18 DIAGNOSIS — N183 Chronic kidney disease, stage 3 unspecified: Secondary | ICD-10-CM | POA: Diagnosis not present

## 2018-10-18 DIAGNOSIS — I428 Other cardiomyopathies: Secondary | ICD-10-CM | POA: Insufficient documentation

## 2018-10-18 DIAGNOSIS — M109 Gout, unspecified: Secondary | ICD-10-CM | POA: Diagnosis not present

## 2018-10-18 DIAGNOSIS — Z79899 Other long term (current) drug therapy: Secondary | ICD-10-CM | POA: Insufficient documentation

## 2018-10-18 DIAGNOSIS — Z818 Family history of other mental and behavioral disorders: Secondary | ICD-10-CM | POA: Insufficient documentation

## 2018-10-18 DIAGNOSIS — Z91018 Allergy to other foods: Secondary | ICD-10-CM | POA: Insufficient documentation

## 2018-10-18 DIAGNOSIS — F329 Major depressive disorder, single episode, unspecified: Secondary | ICD-10-CM | POA: Diagnosis not present

## 2018-10-18 LAB — BASIC METABOLIC PANEL
Anion gap: 15 (ref 5–15)
BUN: 15 mg/dL (ref 6–20)
CO2: 19 mmol/L — ABNORMAL LOW (ref 22–32)
Calcium: 8.9 mg/dL (ref 8.9–10.3)
Chloride: 98 mmol/L (ref 98–111)
Creatinine, Ser: 1.3 mg/dL — ABNORMAL HIGH (ref 0.61–1.24)
GFR calc Af Amer: >60 mL/min (ref >60)
GFR calc non Af Amer: >60 mL/min (ref >60)
Glucose, Bld: 175 mg/dL — ABNORMAL HIGH (ref 70–99)
Potassium: 3.4 mmol/L — ABNORMAL LOW (ref 3.5–5.1)
Sodium: 132 mmol/L — ABNORMAL LOW (ref 135–145)

## 2018-10-18 LAB — BRAIN NATRIURETIC PEPTIDE: B Natriuretic Peptide: 58.4 pg/mL (ref 0.0–100.0)

## 2018-10-18 MED ORDER — FUROSEMIDE 80 MG PO TABS: ORAL_TABLET | ORAL | 3 refills | Status: DC

## 2018-10-18 NOTE — Progress Notes (Signed)
ADVANCED HF CLINIC CONSULT NOTE  Referring Physician: Dr. Rayann Heman Primary Care: Primary Cardiologist: Dr. Rayann Heman   HPI:  Stanley [name] is a 53 y.o. male with a hx of NICM in 2006 (felt 2/2 HTN/ETOH), ? CAD s/p PCI to unknown vessel 2011 at outside location (cath 2014 without CAD and normal coronaries by CT 07/2016) eventually a [place] Sci ICD implant done in [place] 2015, VT 2017, noncompliance, chronic chest pain of noncardiac source, depression w/ETOH abuse, HTN, gout who is being seen today for the evaluation of HF at the request of Dr. Rayann Heman.   In 2017 he received inappropriate therapy for ST with ATP (after a work out at [employer] and reported noncompliance with meds) that degenerated into VT and received several shocks. He has had issues with chronic chest pain without evidence for ischemic etiology including normal cath and cardiac CT as above. This is relatively unchanged. Echo 03/2016  EF 35-40%, grade 1 DD, mild MR, moderate LAE  Had recurrent ICD shock in 3/20 in setting of medicine noncompliance. Amio continued. Echo 3/20 EF 25-30% Normal RV  Here for routine f/u. Says he has been feeling a whole lot better. Has been seen in pharmD and Bidil added. Still with DOE. Can walk quarter mile and then has to stop. Gets SOB with steps and hills. No edema, orthopnea or PND. Rare CP. Compliant with meds. This am missed breakfast and BP was low in 70-80s. Weight down about 5 pounds. Taking lasix 80 once a day.   CPX 09/19/18   FVC 1.96 (57%)    FEV1 1.60 (58%)     FEV1/FVC 81 (101%)     MVV 80 (61%)      Resting HR: 77 Peak HR: 131  (79% age predicted max HR)  BP rest: 168/80     Standing BP: 156/74 BP peak: 196/80   Peak VO2: 14.9 (52% predicted peak VO2)  VE/VCO2 slope: 31  OUES: 1.43  Peak RER: 1.05  VE/MVV: 50% O2pulse: 10  (67% predicted O2pulse)   Moderate to severe HF limitation but normal VeVCO2 slope is reassuring. Restrictive lung physiology.       Past Medical History:  Diagnosis Date  . Acute renal failure (ARF) ([place])   . CAD (coronary artery disease)    a. dx unclear, reported PCI in 2011 but cath 2014 normal coronaries and cardiac CT 07/2016 normal coronaries, no mention of stents.  . CHF (congestive heart failure) ([place])   . Gout   . Gouty arthritis   . Hypertension   . ICD (implantable cardioverter-defibrillator) in place   . Insomnia   . Nonischemic cardiomyopathy ([place])   . Ventricular tachycardia ([place]) 01/2015   ATP delivered for sinus tach, degenerated into VT for which he received shock    Current Outpatient Medications  Medication Sig Dispense Refill  . amiodarone (PACERONE) 200 MG tablet Take 1 tablet (200 mg total) by mouth daily. 90 tablet 3  . atorvastatin (LIPITOR) 40 MG tablet Take 1 tablet (40 mg total) by mouth daily. 90 tablet 3  . bismuth subsalicylate (PEPTO BISMOL) 262 MG/15ML suspension Take 30 mLs by mouth every 6 (six) hours as needed for indigestion.    . busPIRone (BUSPAR) 10 MG tablet Take 1 tablet (10 mg total) by mouth daily. 60 tablet 1  . carvedilol (COREG) 25 MG tablet Take 1 tablet (25 mg total) by mouth 2 (two) times daily with a meal. 180 tablet 3  . digoxin (LANOXIN) 0.125 MG tablet Take  1 tablet (0.125 mg total) by mouth daily. 90 tablet 3  . escitalopram (LEXAPRO) 20 MG tablet Take 20 mg by mouth daily.    . furosemide (LASIX) 80 MG tablet Take 1 tablet (80 mg total) by mouth daily. 90 tablet 3  . ibuprofen (ADVIL,MOTRIN) 200 MG tablet Take 200 mg by mouth every 6 (six) hours as needed (gout).    . isosorbide-hydrALAZINE (BIDIL) 20-37.5 MG tablet Take 1 tablet by mouth 3 (three) times daily. 90 tablet 3  . losartan (COZAAR) 50 MG tablet Take 1 tablet (50 mg total) by mouth daily. 90 tablet 3  . nitroGLYCERIN (NITROSTAT) 0.4 MG SL tablet Place 1 tablet (0.4 mg total) under the tongue every 5 (five) minutes x 3 doses as needed for chest pain. 30 tablet 6  . potassium chloride SA (K-DUR,KLOR-CON)  20 MEQ tablet Take 1 tablet (20 mEq total) by mouth daily. 90 tablet 3  . spironolactone (ALDACTONE) 25 MG tablet Take 1 tablet (25 mg total) by mouth daily. 30 tablet 3  . traZODone (DESYREL) 150 MG tablet Take 1 tablet (150 mg total) by mouth at bedtime. Please make appointment with PCP. 30 tablet 0   No current facility-administered medications for this encounter.     Allergies  Allergen Reactions  . Lisinopril Shortness Of Breath  . Apple Swelling    Lip Swelling  . Other Swelling    Nuts - Lip Swelling  . Shellfish Allergy Swelling    Lip Swelling      Social History   Socioeconomic History  . Marital status: Divorced    Spouse name: Not on file  . Number of children: 1  . Years of education: 21  . Highest education level: Not on file  Occupational History  . Occupation: Disability  Social Needs  . Financial resource strain: Somewhat hard  . Food insecurity    Worry: Sometimes true    Inability: Sometimes true  . Transportation needs    Medical: No    Non-medical: No  Tobacco Use  . Smoking status: Never Smoker  . Smokeless tobacco: Never Used  Substance and Sexual Activity  . Alcohol use: Not Currently    Alcohol/week: 0.0 standard drinks  . Drug use: No  . Sexual activity: Not Currently  Lifestyle  . Physical activity    Days per week: Patient refused    Minutes per session: Patient refused  . Stress: To some extent  Relationships  . Social Herbalist on phone: Patient refused    Gets together: Patient refused    Attends religious service: Patient refused    Active member of club or organization: Patient refused    Attends meetings of clubs or organizations: Patient refused    Relationship status: Patient refused  . Intimate partner violence    Fear of current or ex partner: No    Emotionally abused: No    Physically abused: No    Forced sexual activity: No  Other Topics Concern  . Not on file  Social History Narrative   Lives in  [place] alone.  Disabled.  Previously worked as a Careers adviser.   Fun: Rest, Read and watch movies.       Family History  Problem Relation Age of Onset  . Cancer Mother        Pancreatic  . Hypertension Father   . Alzheimer's disease Father   . Gout Father   . Dementia Father   . Hypertension Sister   .  Clotting disorder Sister   . Obesity Brother   . Bronchitis Brother   . Heart disease Maternal Grandmother   . Gout Paternal Grandfather     Vitals:   10/18/18 1152 10/18/18 1216  BP: (!) 80/64 118/82  Pulse: 83 79  SpO2: 97% 97%  Weight: 82.9 kg (182 lb 12.8 oz)     PHYSICAL EXAM: General:  Well appearing. No resp difficulty HEENT: normal Neck: supple. no JVD. Carotids 2+ bilat; no bruits. No lymphadenopathy or thryomegaly appreciated. Cor: PMI nondisplaced. Regular rate & rhythm. No rubs, gallops or murmurs. Lungs: clear Abdomen: soft, nontender, nondistended. No hepatosplenomegaly. No bruits or masses. Good bowel sounds. Extremities: no cyanosis, clubbing, rash, edema Neuro: alert & orientedx3, cranial nerves grossly intact. moves all 4 extremities w/o difficulty. Affect pleasant   ECG 03/22/18: ST 108 IVCD 124ms Personally reviewed    ASSESSMENT & PLAN:  1. Chronic systolic heart failure - Mostly NICM (? H/o PCI to unknown vessel in 2011.) CTA 2018 normal cors - EF 25-30% echo 3/20 - NYHA III - CPX 9/20 moderate to severe HF limitation. Peak VO2: 14.9 (52% predicted peak VO2). VE/VCO2 slope: 31  OUES: 1.43 Peak RER: 1.05  - Volume status likely a bit low.  - Will decrease lasix to 80 MWF  - Continue carvedilol 25 bid - Continue losartan 50 daily - Continue digoxin 0.125 daily - Continue spiro 12.5 - Continue Bidil 1 tid - No ARNI with angioedema with ACE-I - Will see him back in about 2 months with echo. If EF < 35% will discuss ICD - Long talk about possible need for advanced therapies  2. VT - quiescent on amio - followed by Dr. Rayann Heman -  No VT on ICD on personal interrogation today  3. HTN - BP low today in setting of decreased volume. Cut lasix back   4. OSA - encouraged getting on CPAP  5. CKD 3 - creatinine 1.3-1.6 - recheck today  6. CAD  - h/o single vessel PCI. Last cath 2011 - continue ASA/statin  7. Depession - h/o suicidal ideation - brother killed in 2014 -Improved   8. ETOH - likely related to depression - discuss need for cessation    Glori Bickers, MD  11:47 AM

## 2018-10-18 NOTE — Addendum Note (Signed)
Encounter addended by: Kerry Dory, CMA on: 10/18/2018 12:34 PM  Actions taken: Charge Capture section accepted, Pharmacy for encounter modified, Diagnosis association updated, Order list changed

## 2018-10-18 NOTE — Progress Notes (Signed)
Was called into room to recheck blood pressure as patient 80/64.  Pt reports to feeling nauseous and diaphoretic on appearance. Pt advised to lay flat with knees up.  Shortly after, pt reports some improvement. He notes not eating breakfast this morning.  Given graham crackers and soda. Blood glucose 99 after crackers/soda.  Pt BP rechecked 118/82. Pt reports to feeling better.  MD aware of same

## 2018-10-18 NOTE — Telephone Encounter (Signed)
Pt aware of lab results. K 3.4. Pt will take extra 40 meq Potassium today and tomorrow. Verbalized understanding.

## 2018-10-18 NOTE — Patient Instructions (Addendum)
TAKE Lasix 80mg  (1 tab) ONLY ON Monday, Wednesday and Friday.  Take extra as needed  Labs today We will only contact you if something comes back abnormal or we need to make some changes. Otherwise no news is good news!  Your physician has requested that you have an echocardiogram. Echocardiography is a painless test that uses sound waves to create images of your heart. It provides your doctor with information about the size and shape of your heart and how well your heart's chambers and valves are working. This procedure takes approximately one hour. There are no restrictions for this procedure.  Your physician recommends that you schedule a follow-up appointment in: 2 months with an ECHO  At the [facility], you and your health needs are our priority. As part of our continuing mission to provide you with exceptional heart care, we have created designated Provider Care Teams. These Care Teams include your primary Cardiologist (physician) and Advanced Practice Providers (APPs- Physician Assistants and Nurse Practitioners) who all work together to provide you with the care you need, when you need it.   You may see any of the following providers on your designated Care Team at your next follow up: Marland Kitchen Dr Glori Bickers . Dr Loralie Champagne . Darrick Grinder, NP . Lyda Jester, PA   Please be sure to bring in all your medications bottles to every appointment.

## 2018-10-18 NOTE — Telephone Encounter (Signed)
-----   Message from Jolaine Artist, MD sent at 10/18/2018  3:28 PM EDT ----- Mild AKI and hypokalemia. Lasix cut back. Please have him take kcl 40 meq extra today and tomorrow.

## 2018-10-18 NOTE — Addendum Note (Signed)
Encounter addended by: Valeda Malm, RN on: 10/18/2018 12:37 PM  Actions taken: Charge Capture section accepted, Clinical Note Signed

## 2018-10-21 ENCOUNTER — Other Ambulatory Visit (HOSPITAL_COMMUNITY): Payer: Self-pay

## 2018-10-21 DIAGNOSIS — F5104 Psychophysiologic insomnia: Secondary | ICD-10-CM

## 2018-10-24 ENCOUNTER — Other Ambulatory Visit (HOSPITAL_COMMUNITY): Payer: Self-pay

## 2018-10-24 DIAGNOSIS — F5104 Psychophysiologic insomnia: Secondary | ICD-10-CM

## 2018-10-24 MED ORDER — TRAZODONE HCL 150 MG PO TABS: 150.0000 mg | ORAL_TABLET | Freq: Every day | ORAL | 0 refills | Status: DC

## 2018-11-22 ENCOUNTER — Other Ambulatory Visit (HOSPITAL_COMMUNITY): Payer: Self-pay

## 2018-11-22 DIAGNOSIS — F5104 Psychophysiologic insomnia: Secondary | ICD-10-CM

## 2018-11-22 MED ORDER — TRAZODONE HCL 150 MG PO TABS: 150.0000 mg | ORAL_TABLET | Freq: Every day | ORAL | 0 refills | Status: DC

## 2018-11-28 ENCOUNTER — Encounter (HOSPITAL_COMMUNITY): Payer: Self-pay | Admitting: *Deleted

## 2018-12-19 ENCOUNTER — Other Ambulatory Visit (HOSPITAL_COMMUNITY): Payer: Self-pay

## 2018-12-19 DIAGNOSIS — F5104 Psychophysiologic insomnia: Secondary | ICD-10-CM

## 2018-12-19 MED ORDER — TRAZODONE HCL 150 MG PO TABS: 150.0000 mg | ORAL_TABLET | Freq: Every day | ORAL | 0 refills | Status: DC

## 2018-12-23 ENCOUNTER — Ambulatory Visit (HOSPITAL_COMMUNITY): Admission: RE | Admit: 2018-12-23 | Payer: Medicaid Other | Source: Ambulatory Visit

## 2018-12-23 ENCOUNTER — Encounter (HOSPITAL_COMMUNITY): Payer: Medicaid Other | Admitting: Internal Medicine

## 2018-12-28 ENCOUNTER — Other Ambulatory Visit: Payer: Self-pay

## 2018-12-28 ENCOUNTER — Inpatient Hospital Stay (HOSPITAL_COMMUNITY): Payer: Medicare Other

## 2018-12-28 ENCOUNTER — Ambulatory Visit (INDEPENDENT_AMBULATORY_CARE_PROVIDER_SITE_OTHER): Payer: Medicaid Other | Admitting: *Deleted

## 2018-12-28 ENCOUNTER — Encounter (HOSPITAL_COMMUNITY): Payer: Self-pay | Admitting: Emergency Medicine

## 2018-12-28 ENCOUNTER — Inpatient Hospital Stay (HOSPITAL_COMMUNITY)
Admission: EM | Admit: 2018-12-28 | Discharge: 2018-12-31 | DRG: 871 | Disposition: A | Discharge status: Home / Self Care | Payer: Medicare Other | Attending: Internal Medicine | Admitting: Internal Medicine

## 2018-12-28 ENCOUNTER — Emergency Department (HOSPITAL_COMMUNITY): Payer: Medicare Other

## 2018-12-28 DIAGNOSIS — A419 Sepsis, unspecified organism: Principal | ICD-10-CM | POA: Diagnosis present

## 2018-12-28 DIAGNOSIS — Z820 Family history of epilepsy and other diseases of the nervous system: Secondary | ICD-10-CM

## 2018-12-28 DIAGNOSIS — G4733 Obstructive sleep apnea (adult) (pediatric): Secondary | ICD-10-CM | POA: Diagnosis present

## 2018-12-28 DIAGNOSIS — Z8679 Personal history of other diseases of the circulatory system: Secondary | ICD-10-CM | POA: Diagnosis not present

## 2018-12-28 DIAGNOSIS — R6520 Severe sepsis without septic shock: Secondary | ICD-10-CM | POA: Diagnosis present

## 2018-12-28 DIAGNOSIS — E876 Hypokalemia: Secondary | ICD-10-CM | POA: Diagnosis present

## 2018-12-28 DIAGNOSIS — I519 Heart disease, unspecified: Secondary | ICD-10-CM

## 2018-12-28 DIAGNOSIS — I428 Other cardiomyopathies: Secondary | ICD-10-CM | POA: Diagnosis present

## 2018-12-28 DIAGNOSIS — Z9114 Patient's other noncompliance with medication regimen: Secondary | ICD-10-CM | POA: Diagnosis not present

## 2018-12-28 DIAGNOSIS — Z8249 Family history of ischemic heart disease and other diseases of the circulatory system: Secondary | ICD-10-CM

## 2018-12-28 DIAGNOSIS — G8929 Other chronic pain: Secondary | ICD-10-CM | POA: Diagnosis present

## 2018-12-28 DIAGNOSIS — J9601 Acute respiratory failure with hypoxia: Secondary | ICD-10-CM | POA: Diagnosis present

## 2018-12-28 DIAGNOSIS — F329 Major depressive disorder, single episode, unspecified: Secondary | ICD-10-CM | POA: Diagnosis present

## 2018-12-28 DIAGNOSIS — I5022 Chronic systolic (congestive) heart failure: Secondary | ICD-10-CM | POA: Diagnosis not present

## 2018-12-28 DIAGNOSIS — Z888 Allergy status to other drugs, medicaments and biological substances status: Secondary | ICD-10-CM

## 2018-12-28 DIAGNOSIS — Z20828 Contact with and (suspected) exposure to other viral communicable diseases: Secondary | ICD-10-CM | POA: Diagnosis present

## 2018-12-28 DIAGNOSIS — I472 Ventricular tachycardia, unspecified: Secondary | ICD-10-CM

## 2018-12-28 DIAGNOSIS — R0602 Shortness of breath: Secondary | ICD-10-CM | POA: Diagnosis present

## 2018-12-28 DIAGNOSIS — F419 Anxiety disorder, unspecified: Secondary | ICD-10-CM | POA: Diagnosis present

## 2018-12-28 DIAGNOSIS — I5043 Acute on chronic combined systolic (congestive) and diastolic (congestive) heart failure: Secondary | ICD-10-CM | POA: Diagnosis present

## 2018-12-28 DIAGNOSIS — Z91199 Patient's noncompliance with other medical treatment and regimen due to unspecified reason: Secondary | ICD-10-CM

## 2018-12-28 DIAGNOSIS — I340 Nonrheumatic mitral (valve) insufficiency: Secondary | ICD-10-CM | POA: Diagnosis not present

## 2018-12-28 DIAGNOSIS — N179 Acute kidney failure, unspecified: Secondary | ICD-10-CM | POA: Diagnosis present

## 2018-12-28 DIAGNOSIS — I10 Essential (primary) hypertension: Secondary | ICD-10-CM | POA: Diagnosis present

## 2018-12-28 DIAGNOSIS — I4581 Long QT syndrome: Secondary | ICD-10-CM | POA: Diagnosis present

## 2018-12-28 DIAGNOSIS — Z8349 Family history of other endocrine, nutritional and metabolic diseases: Secondary | ICD-10-CM

## 2018-12-28 DIAGNOSIS — Z818 Family history of other mental and behavioral disorders: Secondary | ICD-10-CM

## 2018-12-28 DIAGNOSIS — F1010 Alcohol abuse, uncomplicated: Secondary | ICD-10-CM | POA: Diagnosis present

## 2018-12-28 DIAGNOSIS — J189 Pneumonia, unspecified organism: Secondary | ICD-10-CM | POA: Insufficient documentation

## 2018-12-28 DIAGNOSIS — G4700 Insomnia, unspecified: Secondary | ICD-10-CM | POA: Diagnosis present

## 2018-12-28 DIAGNOSIS — Z8269 Family history of other diseases of the musculoskeletal system and connective tissue: Secondary | ICD-10-CM

## 2018-12-28 DIAGNOSIS — Z79899 Other long term (current) drug therapy: Secondary | ICD-10-CM

## 2018-12-28 DIAGNOSIS — I110 Hypertensive heart disease with heart failure: Secondary | ICD-10-CM | POA: Diagnosis present

## 2018-12-28 DIAGNOSIS — I361 Nonrheumatic tricuspid (valve) insufficiency: Secondary | ICD-10-CM | POA: Diagnosis not present

## 2018-12-28 DIAGNOSIS — Z95810 Presence of automatic (implantable) cardiac defibrillator: Secondary | ICD-10-CM | POA: Diagnosis not present

## 2018-12-28 DIAGNOSIS — J181 Lobar pneumonia, unspecified organism: Secondary | ICD-10-CM | POA: Diagnosis present

## 2018-12-28 DIAGNOSIS — Z9119 Patient's noncompliance with other medical treatment and regimen: Secondary | ICD-10-CM

## 2018-12-28 DIAGNOSIS — R079 Chest pain, unspecified: Secondary | ICD-10-CM | POA: Diagnosis present

## 2018-12-28 DIAGNOSIS — I447 Left bundle-branch block, unspecified: Secondary | ICD-10-CM | POA: Diagnosis present

## 2018-12-28 DIAGNOSIS — M109 Gout, unspecified: Secondary | ICD-10-CM | POA: Diagnosis present

## 2018-12-28 DIAGNOSIS — Z832 Family history of diseases of the blood and blood-forming organs and certain disorders involving the immune mechanism: Secondary | ICD-10-CM

## 2018-12-28 HISTORY — DX: Chronic systolic (congestive) heart failure: I50.22

## 2018-12-28 HISTORY — DX: Other chronic pain: G89.29

## 2018-12-28 HISTORY — DX: Alcohol abuse, uncomplicated: F10.10

## 2018-12-28 HISTORY — DX: Lobar pneumonia, unspecified organism: J18.1

## 2018-12-28 HISTORY — DX: Problem related to housing and economic circumstances, unspecified: Z59.9

## 2018-12-28 HISTORY — DX: Depression, unspecified: F32.A

## 2018-12-28 HISTORY — DX: Patient's noncompliance with other medical treatment and regimen due to unspecified reason: Z91.199

## 2018-12-28 HISTORY — DX: Patient's noncompliance with other medical treatment and regimen: Z91.19

## 2018-12-28 LAB — CREATININE, SERUM
Creatinine, Ser: 0.99 mg/dL (ref 0.61–1.24)
GFR calc Af Amer: >60 mL/min (ref >60)
GFR calc non Af Amer: >60 mL/min (ref >60)

## 2018-12-28 LAB — CBC
HCT: 42.2 % (ref 39.0–52.0)
HCT: 39.2 % (ref 39.0–52.0)
Hemoglobin: 13.5 g/dL (ref 13.0–17.0)
Hemoglobin: 13.1 g/dL (ref 13.0–17.0)
MCH: 35.1 pg — ABNORMAL HIGH (ref 26.0–34.0)
MCH: 34.8 pg — ABNORMAL HIGH (ref 26.0–34.0)
MCHC: 32 g/dL (ref 30.0–36.0)
MCHC: 33.4 g/dL (ref 30.0–36.0)
MCV: 109.6 fL — ABNORMAL HIGH (ref 80.0–100.0)
MCV: 104.3 fL — ABNORMAL HIGH (ref 80.0–100.0)
Platelets: 216 10*3/uL (ref 150–400)
Platelets: 235 10*3/uL (ref 150–400)
RBC: 3.85 MIL/uL — ABNORMAL LOW (ref 4.22–5.81)
RBC: 3.76 MIL/uL — ABNORMAL LOW (ref 4.22–5.81)
RDW: 14.1 % (ref 11.5–15.5)
RDW: 13.9 % (ref 11.5–15.5)
WBC: 17.9 10*3/uL — ABNORMAL HIGH (ref 4.0–10.5)
WBC: 17.4 10*3/uL — ABNORMAL HIGH (ref 4.0–10.5)
nRBC: 0.2 % (ref 0.0–0.2)
nRBC: 0.3 % — ABNORMAL HIGH (ref 0.0–0.2)

## 2018-12-28 LAB — URINALYSIS, ROUTINE W REFLEX MICROSCOPIC
Bilirubin Urine: NEGATIVE
Glucose, UA: NEGATIVE mg/dL
Hgb urine dipstick: NEGATIVE
Ketones, ur: NEGATIVE mg/dL
Leukocytes,Ua: NEGATIVE
Nitrite: NEGATIVE
Protein, ur: NEGATIVE mg/dL
Specific Gravity, Urine: 1.004 — ABNORMAL LOW (ref 1.005–1.030)
pH: 6 (ref 5.0–8.0)

## 2018-12-28 LAB — MAGNESIUM: Magnesium: 1.8 mg/dL (ref 1.7–2.4)

## 2018-12-28 LAB — LACTIC ACID, PLASMA
Lactic Acid, Venous: 3.8 mmol/L (ref 0.5–1.9)
Lactic Acid, Venous: 2.8 mmol/L (ref 0.5–1.9)

## 2018-12-28 LAB — BASIC METABOLIC PANEL
Anion gap: 13 (ref 5–15)
BUN: 12 mg/dL (ref 6–20)
CO2: 23 mmol/L (ref 22–32)
Calcium: 8.6 mg/dL — ABNORMAL LOW (ref 8.9–10.3)
Chloride: 107 mmol/L (ref 98–111)
Creatinine, Ser: 0.98 mg/dL (ref 0.61–1.24)
GFR calc Af Amer: >60 mL/min (ref >60)
GFR calc non Af Amer: >60 mL/min (ref >60)
Glucose, Bld: 76 mg/dL (ref 70–99)
Potassium: 3.1 mmol/L — ABNORMAL LOW (ref 3.5–5.1)
Sodium: 143 mmol/L (ref 135–145)

## 2018-12-28 LAB — FERRITIN: Ferritin: 336 ng/mL (ref 24–336)

## 2018-12-28 LAB — BRAIN NATRIURETIC PEPTIDE: B Natriuretic Peptide: 464.9 pg/mL — ABNORMAL HIGH (ref 0.0–100.0)

## 2018-12-28 LAB — C-REACTIVE PROTEIN: CRP: 8.3 mg/dL — ABNORMAL HIGH (ref <1.0)

## 2018-12-28 LAB — TROPONIN I (HIGH SENSITIVITY)
Troponin I (High Sensitivity): 17 ng/L (ref <18)
Troponin I (High Sensitivity): 19 ng/L — ABNORMAL HIGH (ref <18)

## 2018-12-28 LAB — POC SARS CORONAVIRUS 2 AG -  ED: SARS Coronavirus 2 Ag: NEGATIVE

## 2018-12-28 LAB — SARS CORONAVIRUS 2 (TAT 6-24 HRS): SARS Coronavirus 2: NEGATIVE

## 2018-12-28 LAB — D-DIMER, QUANTITATIVE: D-Dimer, Quant: 1.27 ug/mL-FEU — ABNORMAL HIGH (ref 0.00–0.50)

## 2018-12-28 LAB — PROCALCITONIN: Procalcitonin: 0.16 ng/mL

## 2018-12-28 LAB — DIGOXIN LEVEL: Digoxin Level: <0.2 ng/mL — ABNORMAL LOW (ref 0.8–2.0)

## 2018-12-28 MED ORDER — ISOSORB DINITRATE-HYDRALAZINE 20-37.5 MG PO TABS
1.0000 | ORAL_TABLET | Freq: Three times a day (TID) | ORAL | Status: DC
  Administered 2018-12-29 – 2018-12-31 (×8): 1 via ORAL
  Filled 2018-12-28 (×10): qty 1

## 2018-12-28 MED ORDER — SODIUM CHLORIDE 0.9 % IV SOLN
500.0000 mg | INTRAVENOUS | Status: DC
  Administered 2018-12-29: 18:00:00 500 mg via INTRAVENOUS
  Filled 2018-12-28: qty 500

## 2018-12-28 MED ORDER — ONDANSETRON HCL 4 MG PO TABS: 4.0000 mg | ORAL_TABLET | Freq: Four times a day (QID) | ORAL | Status: DC | PRN

## 2018-12-28 MED ORDER — ESCITALOPRAM OXALATE 20 MG PO TABS
20.0000 mg | ORAL_TABLET | Freq: Every day | ORAL | Status: DC
  Administered 2018-12-28 – 2018-12-29 (×2): 20 mg via ORAL
  Filled 2018-12-28: qty 1
  Filled 2018-12-28: qty 2

## 2018-12-28 MED ORDER — DIGOXIN 125 MCG PO TABS
0.1250 mg | ORAL_TABLET | Freq: Every day | ORAL | Status: DC
  Administered 2018-12-28 – 2018-12-31 (×4): 0.125 mg via ORAL
  Filled 2018-12-28 (×4): qty 1

## 2018-12-28 MED ORDER — TRAZODONE HCL 50 MG PO TABS
150.0000 mg | ORAL_TABLET | Freq: Every evening | ORAL | Status: DC | PRN
  Administered 2018-12-29: 150 mg via ORAL
  Filled 2018-12-28: qty 1

## 2018-12-28 MED ORDER — HYDRALAZINE HCL 10 MG PO TABS: 25.0000 mg | ORAL_TABLET | Freq: Three times a day (TID) | ORAL | Status: DC | PRN

## 2018-12-28 MED ORDER — ACETAMINOPHEN 650 MG RE SUPP: 650.0000 mg | Freq: Four times a day (QID) | RECTAL | Status: DC | PRN

## 2018-12-28 MED ORDER — ACETAMINOPHEN 325 MG PO TABS
650.0000 mg | ORAL_TABLET | Freq: Four times a day (QID) | ORAL | Status: DC | PRN
  Administered 2018-12-29 – 2018-12-30 (×6): 650 mg via ORAL
  Filled 2018-12-28 (×6): qty 2

## 2018-12-28 MED ORDER — SODIUM CHLORIDE (PF) 0.9 % IJ SOLN
INTRAMUSCULAR | Status: AC
  Filled 2018-12-28: qty 50

## 2018-12-28 MED ORDER — SODIUM CHLORIDE 0.9 % IV SOLN
500.0000 mg | Freq: Once | INTRAVENOUS | Status: AC
  Administered 2018-12-28: 500 mg via INTRAVENOUS
  Filled 2018-12-28: qty 500

## 2018-12-28 MED ORDER — SODIUM CHLORIDE 0.9 % IV SOLN
1.0000 g | Freq: Once | INTRAVENOUS | Status: AC
  Administered 2018-12-28: 1 g via INTRAVENOUS
  Filled 2018-12-28: qty 10

## 2018-12-28 MED ORDER — HEPARIN SODIUM (PORCINE) 5000 UNIT/ML IJ SOLN
5000.0000 [IU] | Freq: Three times a day (TID) | INTRAMUSCULAR | Status: DC
  Administered 2018-12-29 – 2018-12-31 (×8): 5000 [IU] via SUBCUTANEOUS
  Filled 2018-12-28 (×8): qty 1

## 2018-12-28 MED ORDER — NITROGLYCERIN IN D5W 200-5 MCG/ML-% IV SOLN
0.0000 ug/min | INTRAVENOUS | Status: DC
  Administered 2018-12-28: 14:00:00 5 ug/min via INTRAVENOUS
  Filled 2018-12-28: qty 250

## 2018-12-28 MED ORDER — ATORVASTATIN CALCIUM 40 MG PO TABS
40.0000 mg | ORAL_TABLET | Freq: Every day | ORAL | Status: DC
  Administered 2018-12-28 – 2018-12-31 (×4): 40 mg via ORAL
  Filled 2018-12-28 (×4): qty 1

## 2018-12-28 MED ORDER — POTASSIUM CHLORIDE CRYS ER 20 MEQ PO TBCR
40.0000 meq | EXTENDED_RELEASE_TABLET | Freq: Every day | ORAL | Status: DC
  Administered 2018-12-28 – 2018-12-29 (×2): 40 meq via ORAL
  Filled 2018-12-28: qty 2

## 2018-12-28 MED ORDER — CARVEDILOL 25 MG PO TABS
25.0000 mg | ORAL_TABLET | Freq: Two times a day (BID) | ORAL | Status: DC
  Administered 2018-12-29 – 2018-12-31 (×5): 25 mg via ORAL
  Filled 2018-12-28 (×6): qty 1

## 2018-12-28 MED ORDER — SPIRONOLACTONE 12.5 MG HALF TABLET
12.5000 mg | ORAL_TABLET | Freq: Every day | ORAL | Status: DC
  Administered 2018-12-28 – 2018-12-29 (×2): 12.5 mg via ORAL
  Filled 2018-12-28 (×3): qty 1

## 2018-12-28 MED ORDER — BUSPIRONE HCL 10 MG PO TABS
10.0000 mg | ORAL_TABLET | Freq: Every day | ORAL | Status: DC
  Administered 2018-12-28 – 2018-12-31 (×4): 10 mg via ORAL
  Filled 2018-12-28 (×4): qty 1

## 2018-12-28 MED ORDER — ASPIRIN 81 MG PO CHEW
324.0000 mg | CHEWABLE_TABLET | Freq: Once | ORAL | Status: AC
  Administered 2018-12-28: 14:00:00 324 mg via ORAL
  Filled 2018-12-28: qty 4

## 2018-12-28 MED ORDER — POTASSIUM CHLORIDE CRYS ER 20 MEQ PO TBCR
40.0000 meq | EXTENDED_RELEASE_TABLET | ORAL | Status: AC
  Administered 2018-12-28: 40 meq via ORAL
  Filled 2018-12-28 (×2): qty 2

## 2018-12-28 MED ORDER — IOHEXOL 350 MG/ML SOLN
100.0000 mL | Freq: Once | INTRAVENOUS | Status: AC | PRN
  Administered 2018-12-28: 19:00:00 80 mL via INTRAVENOUS

## 2018-12-28 MED ORDER — ONDANSETRON HCL 4 MG/2ML IJ SOLN: 4.0000 mg | Freq: Four times a day (QID) | INTRAMUSCULAR | Status: DC | PRN

## 2018-12-28 MED ORDER — FUROSEMIDE 10 MG/ML IJ SOLN
80.0000 mg | Freq: Once | INTRAMUSCULAR | Status: AC
  Administered 2018-12-28: 14:00:00 80 mg via INTRAVENOUS
  Filled 2018-12-28: qty 8

## 2018-12-28 MED ORDER — SODIUM CHLORIDE 0.9 % IV SOLN
1.0000 g | INTRAVENOUS | Status: DC
  Administered 2018-12-29 – 2018-12-30 (×2): 1 g via INTRAVENOUS
  Filled 2018-12-28: qty 1
  Filled 2018-12-28: qty 10
  Filled 2018-12-28: qty 1

## 2018-12-28 MED ORDER — NITROGLYCERIN 0.4 MG SL SUBL
0.4000 mg | SUBLINGUAL_TABLET | SUBLINGUAL | Status: DC | PRN
  Administered 2018-12-29 (×3): 0.4 mg via SUBLINGUAL
  Filled 2018-12-28 (×2): qty 1

## 2018-12-28 NOTE — ED Triage Notes (Signed)
Pt reports intermittent central chest pains with SOb that is worse with movement since Monday. Has CHF, reports 2lb weight loss. Pt had defibrillator.

## 2018-12-28 NOTE — ED Provider Notes (Signed)
[facility] DEPT Provider Note   CSN: [id number] Arrival date & time: 12/28/18  1310     History Chief Complaint  Patient presents with  . Chest Pain  . Shortness of Breath    Stanley [name] is a 53 y.o. male.  Level 5 caveat for respiratory distress.  Patient here with intermittent central chest pain with shortness of breath for the past 2 days.  Reports his chest pain is intermittent lasting for just a few seconds at a time.  He is not having it currently.  Is associate with some shortness of breath that is worse with exertion.  Does have a history of nonischemic cardiomyopathy with AICD in place.  States he is not had his medications for 2 days.  He arrives hypertensive and hypoxic and tachypneic.  Is a dry cough.  No fever.  No leg pain or leg swelling.  His 5 pillow orthopnea is unchanged.  Denies any abdominal pain, nausea, vomiting.  He states the pain is in the left side of his chest that radiates to his left arm lasting for just a few seconds at a time.  Is not exertional or pleuritic. He denies any back pain or abdominal pain.  He had a cardiology appointment this week which he missed. States he has been taking his medications up until 2 days ago.  male with a hx of NICM in 2006 (felt 2/2 HTN/ETOH), ? CAD s/p PCI to unknown vessel 2011 at outside location (cath 2014 without CAD and normal coronaries by CT 07/2016) eventually a [place] [employer] ICD implant done in [place] 2015, VT 2017, noncompliance, chronic chest pain of noncardiac source, depression w/ETOH abuse, HTN, gout   The history is provided by the patient. The history is limited by the condition of the patient.  Chest Pain Associated symptoms: cough, shortness of breath and weakness   Associated symptoms: no abdominal pain, no dizziness, no fever, no headache, no nausea and no vomiting   Shortness of Breath Associated symptoms: chest pain and cough   Associated symptoms: no abdominal pain, no  fever, no headaches, no rash and no vomiting        Past Medical History:  Diagnosis Date  . Acute renal failure (ARF) ([place])   . CAD (coronary artery disease)    a. dx unclear, reported PCI in 2011 but cath 2014 normal coronaries and cardiac CT 07/2016 normal coronaries, no mention of stents.  . CHF (congestive heart failure) ([place])   . Gout   . Gouty arthritis   . Hypertension   . ICD (implantable cardioverter-defibrillator) in place   . Insomnia   . Nonischemic cardiomyopathy ([place])   . Ventricular tachycardia ([place]) 01/2015   ATP delivered for sinus tach, degenerated into VT for which he received shock    Patient Active Problem List   Diagnosis Date Noted  . Obstructive sleep apnea 08/11/2018  . Implantable cardioverter-defibrillator (ICD) in situ 03/21/2018  . ETOH abuse 03/21/2018  . Chronic chest pain 03/21/2018  . Abnormal transaminases 03/21/2018  . AKI (acute kidney injury) ([place]) 03/21/2018  . History of noncompliance with medical treatment, presenting hazards to health 03/21/2018  . Ventricular tachycardia ([place]) 03/21/2018  . Gout 09/25/2015  . Cervical paraspinal muscle spasm 09/25/2015  . Nonischemic cardiomyopathy ([place]) 07/03/2015  . Chronic systolic dysfunction of left ventricle 07/03/2015  . VT (ventricular tachycardia) ([place]) 07/03/2015  . Essential hypertension 07/03/2015    Past Surgical History:  Procedure Laterality Date  . CARDIAC DEFIBRILLATOR PLACEMENT  06/05/13   [employer] Inogen ICD implanted at [facility] in [place] for primary prevention  . HERNIA REPAIR    . PERCUTANEOUS CORONARY STENT INTERVENTION (PCI-S)  2011       Family History  Problem Relation Age of Onset  . Cancer Mother        Pancreatic  . Hypertension Father   . Alzheimer's disease Father   . Gout Father   . Dementia Father   . Hypertension Sister   . Clotting disorder Sister   . Obesity Brother   . Bronchitis Brother   . Heart disease Maternal  Grandmother   . Gout Paternal Grandfather     Social History   Tobacco Use  . Smoking status: Never Smoker  . Smokeless tobacco: Never Used  Substance Use Topics  . Alcohol use: Not Currently    Alcohol/week: 0.0 standard drinks  . Drug use: No    Home Medications Prior to Admission medications   Medication Sig Start Date End Date Taking? Authorizing Provider  amiodarone (PACERONE) 200 MG tablet Take 1 tablet (200 mg total) by mouth daily. 04/18/18   Allred, Jeneen Rinks, MD  atorvastatin (LIPITOR) 40 MG tablet Take 1 tablet (40 mg total) by mouth daily. 04/18/18   Allred, Jeneen Rinks, MD  bismuth subsalicylate (PEPTO BISMOL) 262 MG/15ML suspension Take 30 mLs by mouth every 6 (six) hours as needed for indigestion.    [provider]  busPIRone (BUSPAR) 10 MG tablet Take 1 tablet (10 mg total) by mouth daily. Patient not taking: Reported on 10/18/2018 09/26/18   Bensimhon, Shaune Pascal, MD  carvedilol (COREG) 25 MG tablet Take 1 tablet (25 mg total) by mouth 2 (two) times daily with a meal. 08/18/18   Bensimhon, Shaune Pascal, MD  digoxin (LANOXIN) 0.125 MG tablet Take 1 tablet (0.125 mg total) by mouth daily. 09/26/18   Bensimhon, Shaune Pascal, MD  escitalopram (LEXAPRO) 20 MG tablet Take 20 mg by mouth daily.    [provider]  furosemide (LASIX) 80 MG tablet Take 1 tablet (80 mg total) by mouth every Monday, Wednesday, and Friday. May also take 1 tablet (80 mg total) as needed. 10/18/18   Bensimhon, Shaune Pascal, MD  ibuprofen (ADVIL,MOTRIN) 200 MG tablet Take 200 mg by mouth every 6 (six) hours as needed (gout).    [provider]  isosorbide-hydrALAZINE (BIDIL) 20-37.5 MG tablet Take 1 tablet by mouth 3 (three) times daily. 09/01/18   Larey Dresser, MD  losartan (COZAAR) 50 MG tablet Take 1 tablet (50 mg total) by mouth daily. 04/18/18   Allred, Jeneen Rinks, MD  nitroGLYCERIN (NITROSTAT) 0.4 MG SL tablet Place 1 tablet (0.4 mg total) under the tongue every 5 (five) minutes x 3 doses as needed  for chest pain. Patient not taking: Reported on 10/18/2018 03/22/18   Baldwin Jamaica, PA-C  potassium chloride SA (K-DUR,KLOR-CON) 20 MEQ tablet Take 1 tablet (20 mEq total) by mouth daily. 04/18/18   Allred, Jeneen Rinks, MD  spironolactone (ALDACTONE) 25 MG tablet Take 12.5 mg by mouth daily.    [provider]  traZODone (DESYREL) 150 MG tablet Take 1 tablet (150 mg total) by mouth at bedtime. Please make appointment with PCP. 12/19/18   Bensimhon, Shaune Pascal, MD    Allergies    Lisinopril, Apple, Other, and Shellfish allergy  Review of Systems   Review of Systems  Constitutional: Negative for activity change, appetite change and fever.  HENT: Negative for congestion and rhinorrhea.   Respiratory: Positive  for cough, chest tightness and shortness of breath.   Cardiovascular: Positive for chest pain.  Gastrointestinal: Negative for abdominal pain, nausea and vomiting.  Genitourinary: Negative for dysuria and hematuria.  Musculoskeletal: Negative for arthralgias and myalgias.  Skin: Negative for rash.  Neurological: Positive for weakness. Negative for dizziness and headaches.   all other systems are negative except as noted in the HPI and PMH.    Physical Exam Updated Vital Signs BP (!) 189/91 (BP Location: Left Arm)   Pulse (!) 102   Temp 98.6 F (37 C) (Oral)   Resp 20   SpO2 (!) 88%   Physical Exam Vitals and nursing note reviewed.  Constitutional:      General: He is in acute distress.     Appearance: He is well-developed.     Comments: Frequent dry cough, moderate respiratory distress, tachypneic and speaking short.  HENT:     Head: Normocephalic and atraumatic.     Mouth/Throat:     Pharynx: No oropharyngeal exudate.  Eyes:     Conjunctiva/sclera: Conjunctivae normal.     Pupils: Pupils are equal, round, and reactive to light.  Neck:     Comments: No meningismus. Cardiovascular:     Rate and Rhythm: Normal rate and regular rhythm.     Heart sounds: Normal  heart sounds. No murmur.  Pulmonary:     Effort: Pulmonary effort is normal. No respiratory distress.     Breath sounds: Rhonchi present.     Comments: Scattered rhonchi diffuse Chest:     Chest wall: Tenderness present.  Abdominal:     Palpations: Abdomen is soft.     Tenderness: There is no abdominal tenderness. There is no guarding or rebound.  Musculoskeletal:        General: No tenderness. Normal range of motion.     Cervical back: Normal range of motion and neck supple.     Right lower leg: No edema.     Left lower leg: No edema.  Skin:    General: Skin is warm.     Capillary Refill: Capillary refill takes less than 2 seconds.  Neurological:     General: No focal deficit present.     Mental Status: He is alert and oriented to person, place, and time. Mental status is at baseline.     Cranial Nerves: No cranial nerve deficit.     Motor: No abnormal muscle tone.     Coordination: Coordination normal.     Comments: No ataxia on finger to nose bilaterally. No pronator drift. 5/5 strength throughout. CN 2-12 intact.Equal grip strength. Sensation intact.   Psychiatric:        Behavior: Behavior normal.     ED Results / Procedures / Treatments   Labs (all labs ordered are listed, but only abnormal results are displayed) Labs Reviewed  BASIC METABOLIC PANEL - Abnormal; Notable for the following components:      Result Value   Potassium 3.1 (*)    Calcium 8.6 (*)    All other components within normal limits  CBC - Abnormal; Notable for the following components:   WBC 17.9 (*)    RBC 3.85 (*)    MCV 109.6 (*)    MCH 35.1 (*)    All other components within normal limits  BRAIN NATRIURETIC PEPTIDE - Abnormal; Notable for the following components:   B Natriuretic Peptide 464.9 (*)    All other components within normal limits  D-DIMER, QUANTITATIVE (NOT AT [facility]) - Abnormal; Notable  for the following components:   D-Dimer, Quant 1.27 (*)    All other components within normal  limits  DIGOXIN LEVEL - Abnormal; Notable for the following components:   Digoxin Level <0.2 (*)    All other components within normal limits  LACTIC ACID, PLASMA - Abnormal; Notable for the following components:   Lactic Acid, Venous 3.8 (*)    All other components within normal limits  LACTIC ACID, PLASMA - Abnormal; Notable for the following components:   Lactic Acid, Venous 2.8 (*)    All other components within normal limits  URINALYSIS, ROUTINE W REFLEX MICROSCOPIC - Abnormal; Notable for the following components:   Color, Urine COLORLESS (*)    Specific Gravity, Urine 1.004 (*)    All other components within normal limits  C-REACTIVE PROTEIN - Abnormal; Notable for the following components:   CRP 8.3 (*)    All other components within normal limits  TROPONIN I (HIGH SENSITIVITY) - Abnormal; Notable for the following components:   Troponin I (High Sensitivity) 19 (*)    All other components within normal limits  CULTURE, BLOOD (ROUTINE X 2)  CULTURE, BLOOD (ROUTINE X 2)  SARS CORONAVIRUS 2 (TAT 6-24 HRS)  URINE CULTURE  MAGNESIUM  FERRITIN  PROCALCITONIN  CBC  CREATININE, SERUM  COMPREHENSIVE METABOLIC PANEL  CBC  POC SARS CORONAVIRUS 2 AG -  ED  TROPONIN I (HIGH SENSITIVITY)    EKG EKG Interpretation  Date/Time:  Wednesday December 28 2018 13:16:27 EST Ventricular Rate:  106 PR Interval:    QRS Duration: 130 QT Interval:  334 QTC Calculation: 444 R Axis:   -32 Text Interpretation: Sinus tachycardia Probable left atrial enlargement Left bundle branch block Baseline wander in lead(s) I III aVL V1 V2 V3 Rate faster Confirmed by Ezequiel Essex [id number]) on 12/28/2018 1:30:34 PM   Radiology DG Chest 2 View  Result Date: 12/28/2018 CLINICAL DATA:  Chest pain and shortness of breath. EXAM: CHEST - 2 VIEW COMPARISON:  Chest x-ray dated 03/21/2018. FINDINGS: Dense opacity within the RIGHT lower lobe, consistent with pneumonia. LEFT lung is clear. Stable cardiomegaly.  LEFT chest wall pacemaker/ICD hardware is stable in alignment. Osseous structures about the chest are unremarkable. IMPRESSION: RIGHT lower lobe pneumonia. Electronically Signed   By: Franki Cabot M.D.   On: 12/28/2018 13:40    Procedures .Critical Care Performed by: Ezequiel Essex, MD Authorized by: Ezequiel Essex, MD   Critical care provider statement:    Critical care time (minutes):  60   Critical care was necessary to treat or prevent imminent or life-threatening deterioration of the following conditions:  Respiratory failure and sepsis   Critical care was time spent personally by me on the following activities:  Discussions with consultants, evaluation of patient's response to treatment, examination of patient, ordering and performing treatments and interventions, ordering and review of laboratory studies, ordering and review of radiographic studies, pulse oximetry, re-evaluation of patient's condition, obtaining history from patient or surrogate and review of old charts   (including critical care time)  Medications Ordered in ED Medications  aspirin chewable tablet 324 mg (has no administration in time range)  furosemide (LASIX) injection 80 mg (has no administration in time range)  nitroGLYCERIN 50 mg in dextrose 5 % 250 mL (0.2 mg/mL) infusion (has no administration in time range)    ED Course  I have reviewed the triage vital signs and the nursing notes.  Pertinent labs & imaging results that were available during my care of the patient  were reviewed by me and considered in my medical decision making (see chart for details).    MDM Rules/Calculators/A&P                      Patient here shortness of breath and intermittent chest pain for the past 2 days.  He is hypoxic, tachypneic and hypertensive.  States he is not had his medications for 2 days.  Does have rhonchi on exam. Previous chest pain workup has been negative for ischemic cause.   Chest x-ray shows  asymmetric edema versus pneumonia.  Patient given IV Lasix as well as started on IV nitroglycerin for his elevated blood pressure Rapid COVID antigen negative.  Did spike temp to 101. Rocephin and azithromycin started for possible pneumonia after cultures obtained. Meets sepsis criteria with fever, leukocytosis, elevated lactate and respiratory rate.lactate elevation may be from work of breathing as well.  Given poor EF and elevated BP, will not give aggressive fluids. COVID is still a consideration.  D/w Cardiology team who will evaluate and agrees with medical admission for suspected sepsis from PNA. Does not think he needs to be transferred to [facility].  Admission d/w Dr. Broadus John.  Stanley [name] was evaluated in Emergency Department on 12/28/2018 for the symptoms described in the history of present illness. He was evaluated in the context of the global COVID-19 pandemic, which necessitated consideration that the patient might be at risk for infection with the SARS-CoV-2 virus that causes COVID-19. Institutional protocols and algorithms that pertain to the evaluation of patients at risk for COVID-19 are in a state of rapid change based on information released by regulatory bodies including the CDC and federal and state organizations. These policies and algorithms were followed during the patient's care in the ED.  Final Clinical Impression(s) / ED Diagnoses Final diagnoses:  Sepsis with acute hypoxic respiratory failure without septic shock, due to unspecified organism [facility])    Rx / [place] Orders ED Discharge Orders    None       Ezequiel Essex, MD 12/28/18 1914

## 2018-12-28 NOTE — Consult Note (Signed)
Cardiology Consultation:   Patient ID: Stanley [name]; [id number]; 1965/12/31   Admit date: 12/28/2018 Date of Consult: 12/28/2018  Primary Care Provider: Gildardo Pounds, NP Primary Cardiologist: Glori Bickers, MD Primary Electrophysiologist:  Thompson Grayer, MD  Chief Complaint: shortness of breath  Patient Profile:   Stanley [name] is a 53 y.o. male with a hx of NICM diagnosed around 2006 (felt 2/2 HTN/ETOH), chronic systolic CHF,[place] [employer] in [place] 2015, ?patient-reported PCI to unknown vessel 2011 at outside location (cath 2014 without CAD and normal coronaries by CT 07/2016), ventricular tachycardia, noncompliance with financial challenges, chronic chest pain of noncardiac source,depression w/ETOH abuse, HTN, gout who is being seen today for the evaluation of shortness of breath at the request of Dr. Wyvonnia Dusky.   He presented to the ED with hypoxia, tachycardia, and marked HTN. While in the ED was noted to have a fever of 101.27F.  History of Present Illness:   History isnoted above. The patient emphasizes both his depression and financial difficulties as barriers to care. He is on disability with fixed income. He has a history of medication noncompliance. In 2017 he received inappropriatetherapyfor ST with ATP (after a work out at [employer] and reported noncompliance with meds) that degenerated into VT and received several shocks. He has had issues with chronic chest pain without evidence for ischemic etiologyincluding normal cath and cardiac CT as above. He had a recurrent ICD shock in 03/2018 in setting of medicine noncompliance. EF was 25-30% with impaired relaxation, diffuse HK, normal RV function but mildly enlarged RV. Amiodarone was continued at that time. CPX 09/2018 showed moderate-severe HF limitation but normal VeVCO2 slope is reassuring. Resting PFTs showed moderate restriction. There were frequent PVCs during study. At last OV 10/18/18 he was feeling well and taking  his medicines. His blood pressure was low and weight was down so Lasix was cut back to MWF.   Initial history obtained by calling into to the room. He presented to the ED with worsening SOB. He's been feeling poorly for the last 2-3 weeks with generalized fatigue and dyspnea. However, the last 2 days he's had worsening DOE as well as nausea, vomiting, dizziness, and dry cough. He was supposed to have a repeat echo and cardiology appointment on 12/23/18 but he cancelled due to feeling poorly. He denies any fevers, chills or sick contacts. His weight is down about 2-3lb from his baseline per his report. He denies any change in orthopnea, always sleeps on 5 pillows. Denies lower extremity edema except started having a gout flare recently as well. He reports medication compliance except missed 12/21-12/22/20 due to feeling bad. He also reports intermittent central chest pain that lasts a few seconds at a time but states this is not significantly changed from what he feels chronically. He arrived to the ED hypertensive, tachycardic and hypoxic (88% on 4L). Does not wear O2 at home. EKG showed sinus tach with LBBB (previously borderline LBBB). CXR is concerning for RLL PNA. Initial labs reveal leukocytosis of 17.9, d-dimer 1.27, lactic acidosis of 3.8, normal hsTroponin, BNP 464, normal Cr, hypokalemia of 3.1. Rapid covid negative, with formal PCR swab pending. He was given 324mg  ASA, 80mg  IV Lasix, started on a NTG drip, and will be started on antibiotics (azithro + ceftriaxone). With these measures he reports his SOB is about half improved (from a "9/10 to a 5/10").  Past Medical History:  Diagnosis Date  . Acute renal failure (ARF) ([place])   . Alcohol abuse   .  CAD (coronary artery disease)    a. dx unclear, reported PCI in 2011 but cath 2014 normal coronaries and cardiac CT 07/2016 normal coronaries, no mention of stents.  . Chronic chest pain   . Chronic systolic CHF (congestive heart failure) ([place])   .  Depression   . Financial difficulties   . Gout   . Gouty arthritis   . History of noncompliance with medical treatment    financial challenges  . Hypertension   . ICD (implantable cardioverter-defibrillator) in place    [place] Sci[employer] implant done in [place] 2015  . Insomnia   . Nonischemic cardiomyopathy ([place])   . Ventricular tachycardia ([place]) 01/2015   2017 - ATP delivered for sinus tach, degenerated into VT for which he received shock. Recurrent VT 03/2018.    Past Surgical History:  Procedure Laterality Date  . CARDIAC DEFIBRILLATOR PLACEMENT  06/05/13   [employer] Inogen ICD implanted at [facility] in [place] for primary prevention  . HERNIA REPAIR    . PERCUTANEOUS CORONARY STENT INTERVENTION (PCI-S)  2011     Inpatient Medications: Scheduled Meds:  Continuous Infusions: . azithromycin    . cefTRIAXone (ROCEPHIN)  IV    . nitroGLYCERIN 20 mcg/min (12/28/18 1447)   PRN Meds:   Home Meds: Prior to Admission medications   Medication Sig Start Date End Date Taking? Authorizing Provider  atorvastatin (LIPITOR) 40 MG tablet Take 1 tablet (40 mg total) by mouth daily. 04/18/18  Yes Allred, Jeneen Rinks, MD  busPIRone (BUSPAR) 10 MG tablet Take 1 tablet (10 mg total) by mouth daily. 09/26/18  Yes Bensimhon, Shaune Pascal, MD  carvedilol (COREG) 25 MG tablet Take 1 tablet (25 mg total) by mouth 2 (two) times daily with a meal. 08/18/18  Yes Bensimhon, Shaune Pascal, MD  digoxin (LANOXIN) 0.125 MG tablet Take 1 tablet (0.125 mg total) by mouth daily. 09/26/18  Yes Bensimhon, Shaune Pascal, MD  escitalopram (LEXAPRO) 20 MG tablet Take 20 mg by mouth daily.   Yes [provider]  furosemide (LASIX) 80 MG tablet Take 80 mg by mouth See admin instructions. Take 1 tablet (80 mg total) by mouth every Monday, Wednesday, and Friday. May also take 1 tablet (80 mg total) as needed for swelling.   Yes [provider]  isosorbide-hydrALAZINE (BIDIL) 20-37.5 MG tablet Take 1 tablet by  mouth 3 (three) times daily. 09/01/18  Yes Larey Dresser, MD  losartan (COZAAR) 50 MG tablet Take 1 tablet (50 mg total) by mouth daily. 04/18/18  Yes Allred, Jeneen Rinks, MD  nitroGLYCERIN (NITROSTAT) 0.4 MG SL tablet Place 1 tablet (0.4 mg total) under the tongue every 5 (five) minutes x 3 doses as needed for chest pain. 03/22/18  Yes Baldwin Jamaica, PA-C  potassium chloride SA (K-DUR,KLOR-CON) 20 MEQ tablet Take 1 tablet (20 mEq total) by mouth daily. 04/18/18  Yes Allred, Jeneen Rinks, MD  spironolactone (ALDACTONE) 25 MG tablet Take 12.5 mg by mouth daily.   Yes [provider]  traZODone (DESYREL) 150 MG tablet Take 1 tablet (150 mg total) by mouth at bedtime. Please make appointment with PCP. Patient taking differently: Take 150 mg by mouth at bedtime as needed for sleep.  12/19/18  Yes Bensimhon, Shaune Pascal, MD  amiodarone (PACERONE) 200 MG tablet Take 1 tablet (200 mg total) by mouth daily. Patient not taking: Reported on 12/28/2018 04/18/18   Thompson Grayer, MD    Allergies:    Allergies  Allergen Reactions  . Lisinopril Shortness Of Breath  .  Apple Swelling    Lip Swelling  . Other Swelling    Nuts - Lip Swelling  . Shellfish Allergy Swelling    Lip Swelling    Social History:   Social History   Socioeconomic History  . Marital status: Divorced    Spouse name: Not on file  . Number of children: 1  . Years of education: 65  . Highest education level: Not on file  Occupational History  . Occupation: Disability  Tobacco Use  . Smoking status: Never Smoker  . Smokeless tobacco: Never Used  Substance and Sexual Activity  . Alcohol use: Yes    Alcohol/week: 0.0 standard drinks  . Drug use: No  . Sexual activity: Not Currently  Other Topics Concern  . Not on file  Social History Narrative   Lives in [place] alone.  Disabled.  Previously worked as a Careers adviser.   Fun: Rest, Read and watch movies.    Social Determinants of Health   Financial Resource Strain:  Medium Risk  . Difficulty of Paying Living Expenses: Somewhat hard  Food Insecurity: Food Insecurity Present  . Worried About Charity fundraiser in the Last Year: Sometimes true  . Ran Out of Food in the Last Year: Sometimes true  Transportation Needs: No Transportation Needs  . Lack of Transportation (Medical): No  . Lack of Transportation (Non-Medical): No  Physical Activity: Unknown  . Days of Exercise per Week: Patient refused  . Minutes of Exercise per Session: Patient refused  Stress: Stress Concern Present  . Feeling of Stress : To some extent  Social Connections: Unknown  . Frequency of Communication with Friends and Family: Patient refused  . Frequency of Social Gatherings with Friends and Family: Patient refused  . Attends Religious Services: Patient refused  . Active Member of Clubs or Organizations: Patient refused  . Attends Archivist Meetings: Patient refused  . Marital Status: Patient refused  Intimate Partner Violence: Not At Risk  . Fear of Current or Ex-Partner: No  . Emotionally Abused: No  . Physically Abused: No  . Sexually Abused: No     Family History:   Family History  Problem Relation Age of Onset  . Cancer Mother        Pancreatic  . Hypertension Father   . Alzheimer's disease Father   . Gout Father   . Dementia Father   . Hypertension Sister   . Clotting disorder Sister   . Obesity Brother   . Bronchitis Brother   . Heart disease Maternal Grandmother   . Gout Paternal Grandfather       ROS:  Please see the history of present illness.  All other ROS reviewed and negative.     Physical Exam/Data:   Vitals:   12/28/18 1420 12/28/18 1422 12/28/18 1430 12/28/18 1440  BP: (!) 193/119 (!) 195/122 (!) 213/122 (!) 174/108  Pulse:  (!) 120 (!) 132 (!) 117  Resp: (!) 31 (!) 29 (!) 23 (!) 26  Temp:      TempSrc:      SpO2: 90% 90% 90% 91%   No intake or output data in the 24 hours ending 12/28/18 1510 Last 3 Weights 10/18/2018  09/26/2018 09/01/2018  Weight (lbs) 182 lb 12.8 oz 190 lb 9.6 oz 190 lb  Weight (kg) 82.918 kg 86.456 kg 86.183 kg     There is no height or weight on file to calculate BMI.  GEN: in no acute distress HEENT: normal Neck: no JVD  Cardiac: RRR; no murmurs, rubs, or gallops,no edema  Respiratory: Diffuse rhonchi GI: soft, nontender,  MS: no deformity or atrophy Skin: warm and dry, no rash Neuro:  Alert and Oriented x 3, Strength and sensation are intact Psych: normal affect   EKG:  The EKG was personally reviewed and demonstrates: sinus tach, LBBB, baseline wander, probable left atrial enlargement, similar to prior except QRS is now sufficiently wide enough to call true LBBB (>150ms)  Telemetry:  Telemetry was personally reviewed and demonstrates:  Sinus tachycardia 110-140 with occasional PVCs, one couplet  Relevant CV Studies:  CPX 09/19/18 FVC 1.96 (57%)    FEV1 1.60 (58%)     FEV1/FVC 81 (101%)     MVV 80 (61%)     Resting HR: 77 Peak HR: 131  (79% age predicted max HR)  BP rest: 168/80     Standing BP: 156/74 BP peak: 196/80   Peak VO2: 14.9 (52% predicted peak VO2)  VE/VCO2 slope: 31  OUES: 1.43  Peak RER: 1.05  VE/MVV: 50% O2pulse: 10  (67% predicted O2pulse)   Moderate to severe HF limitation but normal VeVCO2 slope is reassuring.Restrictive lung physiology.  Laboratory Data:  High Sensitivity Troponin:   Recent Labs  Lab 12/28/18 1405  TROPONINIHS 17     Cardiac EnzymesNo results for input(s): TROPONINI in the last 168 hours. No results for input(s): TROPIPOC in the last 168 hours.  Chemistry Recent Labs  Lab 12/28/18 1405  NA 143  K 3.1*  CL 107  CO2 23  GLUCOSE 76  BUN 12  CREATININE 0.98  CALCIUM 8.6*  GFRNONAA >60  GFRAA >60  ANIONGAP 13    No results for input(s): PROT, ALBUMIN, AST, ALT, ALKPHOS, BILITOT in the last 168 hours. Hematology Recent Labs  Lab 12/28/18 1405  WBC 17.9*  RBC 3.85*  HGB 13.5  HCT 42.2    MCV 109.6*  MCH 35.1*  MCHC 32.0  RDW 14.1  PLT 216   BNP Recent Labs  Lab 12/28/18 1405  BNP 464.9*    DDimer  Recent Labs  Lab 12/28/18 1405  DDIMER 1.27*     Radiology/Studies:  DG Chest 2 View  Result Date: 12/28/2018 CLINICAL DATA:  Chest pain and shortness of breath. EXAM: CHEST - 2 VIEW COMPARISON:  Chest x-ray dated 03/21/2018. FINDINGS: Dense opacity within the RIGHT lower lobe, consistent with pneumonia. LEFT lung is clear. Stable cardiomegaly. LEFT chest wall pacemaker/ICD hardware is stable in alignment. Osseous structures about the chest are unremarkable. IMPRESSION: RIGHT lower lobe pneumonia. Electronically Signed   By: Franki Cabot M.D.   On: 12/28/2018 13:40    Assessment and Plan:   Acute hypoxic respiratory failure with lactic acidosis, leukocytosis and tachycardia, fever and RLL PNA on CXR. Rapid Covid was negative but PCR testing is pending. O2 requirement is new for patient. Being treated for pneumonia with azithromycin and ceftriaxone.  BNP 464, does not appear volume overloaded on exam. Given clinical picture with fever and leukocytosis, favor more PNA. However, with noncompliance x 2 days he was certainly at risk for volume overload. Patient reports weight is actually down from baseline weight. Will add daily weights and strict I/O's. Received IV Lasix in ED. Follow. Needs repletion of potassium, will start with 45meq q4hr x 2.  Accelerated HTN - follow, can resume home meds  LBBB - was incomplete LBBB before. This will need to be followed as OP - question whether he would be candidate for CRT upgrade with ICD.  Chronic chest pain - patient reports this is actually near baseline. Troponin reassuring.  Hypokalemia - replete K to start with 17meq q4hr x 2. Add Mag level.  H/o ETOH abuse and depression - depression remains significant per patient. Will need resources at the direction of primary care.  History of VT - patient denies recent ICD issues.  Keep K 4.0 or greater, Mg 2.0 or greater.   For questions or updates, please contact [place] Please consult [email or web address] for contact info under   Signed, Charlie Pitter, PA-C  12/28/2018 3:10 PM  Donato Heinz, MD

## 2018-12-28 NOTE — H&P (Signed)
History and Physical    Stanley [name] [id number] DOB: 11/18/1965 DOA: 12/28/2018  Referring MD/NP/PA: EDP PCP:  Patient coming from: Home  Chief Complaint: Shortness of breath and chest pain  HPI: Stanley [name] is a 53 y.o. male with medical history significant of nonischemic cardiomyopathy, chronic systolic CHF with EF of 123456, history of V. tach with ICD, noncompliance, chronic chest pain, depression, alcohol abuse, gout, hypertension presents to the ED today with complaint of shortness of breath, weakness in addition to his chronic chest pain. -Patient reports that his symptoms started on Sunday with weakness, loss of appetite, insomnia etc. subsequently developed a cough and has had shortness of breath ever since.  He also has chronic intermittent chest pain which is longstanding with negative work-up, and this has persisted as well.  He also reports that he has not taken his heart medicines in the last couple of days due to feeling unwell. -In the emergency room he was noted to be hypoxic, 86 to 88% on room air requiring requiring 3-4 L of O2, tachycardic with marked hypertension, fever of 101.1, chest x-ray noted right lower lobe pneumonia EDP was initially concerned about CHF, he was given Lasix and cardiology was consulted, subsequently after he spiked a fever he was started on broad-spectrum antibiotics, point-of-care Covid was negative, PCR is pending  Review of Systems: As per HPI otherwise 14 point review of systems negative.   Past Medical History:  Diagnosis Date  . Acute renal failure (ARF) ([place])   . Alcohol abuse   . CAD (coronary artery disease)    a. dx unclear, reported PCI in 2011 but cath 2014 normal coronaries and cardiac CT 07/2016 normal coronaries, no mention of stents.  . Chronic chest pain   . Chronic systolic CHF (congestive heart failure) ([place])   . Depression   . Financial difficulties   . Gout   . Gouty arthritis   . History of noncompliance with  medical treatment    financial challenges  . Hypertension   . ICD (implantable cardioverter-defibrillator) in place    [place] Sci[employer] implant done in [place] 2015  . Insomnia   . Nonischemic cardiomyopathy ([place])   . Ventricular tachycardia ([place]) 01/2015   2017 - ATP delivered for sinus tach, degenerated into VT for which he received shock. Recurrent VT 03/2018.    Past Surgical History:  Procedure Laterality Date  . CARDIAC DEFIBRILLATOR PLACEMENT  06/05/13   [employer] Inogen ICD implanted at [facility] in [place] for primary prevention  . HERNIA REPAIR    . PERCUTANEOUS CORONARY STENT INTERVENTION (PCI-S)  2011     reports that he has never smoked. He has never used smokeless tobacco. He reports current alcohol use. He reports that he does not use drugs.  Allergies  Allergen Reactions  . Lisinopril Shortness Of Breath  . Apple Swelling    Lip Swelling  . Other Swelling    Nuts - Lip Swelling  . Shellfish Allergy Swelling    Lip Swelling    Family History  Problem Relation Age of Onset  . Cancer Mother        Pancreatic  . Hypertension Father   . Alzheimer's disease Father   . Gout Father   . Dementia Father   . Hypertension Sister   . Clotting disorder Sister   . Obesity Brother   . Bronchitis Brother   . Heart disease Maternal Grandmother   . Gout Paternal Grandfather      Prior  to Admission medications   Medication Sig Start Date End Date Taking? Authorizing Provider  atorvastatin (LIPITOR) 40 MG tablet Take 1 tablet (40 mg total) by mouth daily. 04/18/18  Yes Allred, Jeneen Rinks, MD  busPIRone (BUSPAR) 10 MG tablet Take 1 tablet (10 mg total) by mouth daily. 09/26/18  Yes Bensimhon, Shaune Pascal, MD  carvedilol (COREG) 25 MG tablet Take 1 tablet (25 mg total) by mouth 2 (two) times daily with a meal. 08/18/18  Yes Bensimhon, Shaune Pascal, MD  digoxin (LANOXIN) 0.125 MG tablet Take 1 tablet (0.125 mg total) by mouth daily. 09/26/18  Yes Bensimhon, Shaune Pascal, MD    escitalopram (LEXAPRO) 20 MG tablet Take 20 mg by mouth daily.   Yes [provider]  furosemide (LASIX) 80 MG tablet Take 80 mg by mouth See admin instructions. Take 1 tablet (80 mg total) by mouth every Monday, Wednesday, and Friday. May also take 1 tablet (80 mg total) as needed for swelling.   Yes [provider]  isosorbide-hydrALAZINE (BIDIL) 20-37.5 MG tablet Take 1 tablet by mouth 3 (three) times daily. 09/01/18  Yes Larey Dresser, MD  losartan (COZAAR) 50 MG tablet Take 1 tablet (50 mg total) by mouth daily. 04/18/18  Yes Allred, Jeneen Rinks, MD  nitroGLYCERIN (NITROSTAT) 0.4 MG SL tablet Place 1 tablet (0.4 mg total) under the tongue every 5 (five) minutes x 3 doses as needed for chest pain. 03/22/18  Yes Baldwin Jamaica, PA-C  potassium chloride SA (K-DUR,KLOR-CON) 20 MEQ tablet Take 1 tablet (20 mEq total) by mouth daily. 04/18/18  Yes Allred, Jeneen Rinks, MD  spironolactone (ALDACTONE) 25 MG tablet Take 12.5 mg by mouth daily.   Yes [provider]  traZODone (DESYREL) 150 MG tablet Take 1 tablet (150 mg total) by mouth at bedtime. Please make appointment with PCP. Patient taking differently: Take 150 mg by mouth at bedtime as needed for sleep.  12/19/18  Yes Bensimhon, Shaune Pascal, MD  amiodarone (PACERONE) 200 MG tablet Take 1 tablet (200 mg total) by mouth daily. Patient not taking: Reported on 12/28/2018 04/18/18   Thompson Grayer, MD    Physical Exam: Vitals:   12/28/18 1523 12/28/18 1600 12/28/18 1615 12/28/18 1643  BP: (!) 162/97 (!) 151/114 (!) 173/104 (!) 184/113  Pulse: (!) 137 (!) 118 (!) 115 (!) 110  Resp: (!) 26 (!) 25 (!) 26 (!) 25  Temp:      TempSrc:      SpO2: 93% 96% 96% 94%      Constitutional: Average built African-American male sitting up in bed, no distress, wearing 3 L O2 via nasal cannula Vitals:   12/28/18 1523 12/28/18 1600 12/28/18 1615 12/28/18 1643  BP: (!) 162/97 (!) 151/114 (!) 173/104 (!) 184/113  Pulse: (!) 137 (!) 118 (!) 115  (!) 110  Resp: (!) 26 (!) 25 (!) 26 (!) 25  Temp:      TempSrc:      SpO2: 93% 96% 96% 94%   HEENT, pupils equal reactive, neck no JVD Respiratory: Few basilar rhonchi, right greater than left Cardiovascular: S1-S2, regular rate rhythm  abdomen: Soft obese, mildly distended, nontender, bowel sounds present Musculoskeletal: Mild discomfort in both knees Ext: No edema Skin: no rashes, lesions, ulcers.  Neurologic: Moves all extremities, no localizing Psychiatric: Normal judgment and insight. Alert and oriented x 3. Normal mood.   Labs on Admission: I have personally reviewed following labs and imaging studies  CBC: Recent Labs  Lab 12/28/18 1405  WBC 17.9*  HGB  13.5  HCT 42.2  MCV 109.6*  PLT [id number]   Basic Metabolic Panel: Recent Labs  Lab 12/28/18 1405  NA 143  K 3.1*  CL 107  CO2 23  GLUCOSE 76  BUN 12  CREATININE 0.98  CALCIUM 8.6*  MG 1.8   GFR: CrCl cannot be calculated (Unknown ideal weight.). Liver Function Tests: No results for input(s): AST, ALT, ALKPHOS, BILITOT, PROT, ALBUMIN in the last 168 hours. No results for input(s): LIPASE, AMYLASE in the last 168 hours. No results for input(s): AMMONIA in the last 168 hours. Coagulation Profile: No results for input(s): INR, PROTIME in the last 168 hours. Cardiac Enzymes: No results for input(s): CKTOTAL, CKMB, CKMBINDEX, TROPONINI in the last 168 hours. BNP (last 3 results) No results for input(s): PROBNP in the last 8760 hours. HbA1C: No results for input(s): HGBA1C in the last 72 hours. CBG: No results for input(s): GLUCAP in the last 168 hours. Lipid Profile: No results for input(s): CHOL, HDL, LDLCALC, TRIG, CHOLHDL, LDLDIRECT in the last 72 hours. Thyroid Function Tests: No results for input(s): TSH, T4TOTAL, FREET4, T3FREE, THYROIDAB in the last 72 hours. Anemia Panel: No results for input(s): VITAMINB12, FOLATE, FERRITIN, TIBC, IRON, RETICCTPCT in the last 72 hours. Urine analysis:    Component  Value Date/Time   COLORURINE COLORLESS (A) 12/28/2018 1542   APPEARANCEUR CLEAR 12/28/2018 1542   LABSPEC 1.004 (L) 12/28/2018 1542   PHURINE 6.0 12/28/2018 1542   GLUCOSEU NEGATIVE 12/28/2018 1542   HGBUR NEGATIVE 12/28/2018 1542   BILIRUBINUR NEGATIVE 12/28/2018 1542   KETONESUR NEGATIVE 12/28/2018 1542   PROTEINUR NEGATIVE 12/28/2018 1542   NITRITE NEGATIVE 12/28/2018 1542   LEUKOCYTESUR NEGATIVE 12/28/2018 1542   Sepsis Labs: @LABRCNTIP (procalcitonin:4,lacticidven:4) )No results found for this or any previous visit (from the past 240 hour(s)).   Radiological Exams on Admission: DG Chest 2 View  Result Date: 12/28/2018 CLINICAL DATA:  Chest pain and shortness of breath. EXAM: CHEST - 2 VIEW COMPARISON:  Chest x-ray dated 03/21/2018. FINDINGS: Dense opacity within the RIGHT lower lobe, consistent with pneumonia. LEFT lung is clear. Stable cardiomegaly. LEFT chest wall pacemaker/ICD hardware is stable in alignment. Osseous structures about the chest are unremarkable. IMPRESSION: RIGHT lower lobe pneumonia. Electronically Signed   By: Franki Cabot M.D.   On: 12/28/2018 13:40    EKG: Independently reviewed.  Sinus tachycardia, left bundle branch block  Assessment/Plan  Acute hypoxic respiratory failure, fever, right lower lobe pneumonia -Given localized pneumonia in right lower lobe clinical suspicion for Covid is low however this is still a possibility, acute PE is also a possibility, infiltrate could be from infarct, especially considering elevated D-dimer of 1.2 -Await COVID-19 PCR, point-of-care is negative -Obtain CTA chest to rule out PE -Check CRP, ferritin, procalcitonin -Continue IV ceftriaxone and azithromycin, while awaiting above work-up -If Covid PCR is positive will need appropriate treatment namely IV remdesivir/steroids  Chronic systolic CHF/NICM -EF of 123456,, previous work-up for CAD was negative -Cardiology following, does not appear volume overloaded to me,  given Lasix x1 earlier in the ED, reassess volume status in a.m. -Continue carvedilol, digoxin, BiDil, Aldactone  Accelerated hypertension -Resume home BP meds -Resume carvedilol, BiDil, Aldactone  Chronic chest pain -Monitor, ischemia work-up negative before, follow-up CTA  Hypokalemia -Replace  Depression -resume home meds  History of VT -With AICD  DVT prophylaxis: Heparin subcutaneous Code Status: Full code Family Communication: No family at bedside Disposition Plan: Home pending clinical improvement Consults called: Cardiology was called by EDP earlier today Admission  status: Inpatient  Domenic Polite MD [facility]  12/28/2018, 5:11 PM

## 2018-12-29 ENCOUNTER — Other Ambulatory Visit: Payer: Self-pay

## 2018-12-29 DIAGNOSIS — I5022 Chronic systolic (congestive) heart failure: Secondary | ICD-10-CM

## 2018-12-29 DIAGNOSIS — J9601 Acute respiratory failure with hypoxia: Secondary | ICD-10-CM

## 2018-12-29 DIAGNOSIS — G8929 Other chronic pain: Secondary | ICD-10-CM

## 2018-12-29 HISTORY — DX: Acute respiratory failure with hypoxia: J96.01

## 2018-12-29 LAB — CBC
HCT: 37.4 % — ABNORMAL LOW (ref 39.0–52.0)
Hemoglobin: 12.2 g/dL — ABNORMAL LOW (ref 13.0–17.0)
MCH: 34.4 pg — ABNORMAL HIGH (ref 26.0–34.0)
MCHC: 32.6 g/dL (ref 30.0–36.0)
MCV: 105.4 fL — ABNORMAL HIGH (ref 80.0–100.0)
Platelets: 227 10*3/uL (ref 150–400)
RBC: 3.55 MIL/uL — ABNORMAL LOW (ref 4.22–5.81)
RDW: 13.8 % (ref 11.5–15.5)
WBC: 16.3 10*3/uL — ABNORMAL HIGH (ref 4.0–10.5)
nRBC: 0.3 % — ABNORMAL HIGH (ref 0.0–0.2)

## 2018-12-29 LAB — COMPREHENSIVE METABOLIC PANEL
ALT: 24 U/L (ref 0–44)
AST: 34 U/L (ref 15–41)
Albumin: 3.4 g/dL — ABNORMAL LOW (ref 3.5–5.0)
Alkaline Phosphatase: 55 U/L (ref 38–126)
Anion gap: 9 (ref 5–15)
BUN: 12 mg/dL (ref 6–20)
CO2: 24 mmol/L (ref 22–32)
Calcium: 8.1 mg/dL — ABNORMAL LOW (ref 8.9–10.3)
Chloride: 103 mmol/L (ref 98–111)
Creatinine, Ser: 0.96 mg/dL (ref 0.61–1.24)
GFR calc Af Amer: >60 mL/min (ref >60)
GFR calc non Af Amer: >60 mL/min (ref >60)
Glucose, Bld: 107 mg/dL — ABNORMAL HIGH (ref 70–99)
Potassium: 3.2 mmol/L — ABNORMAL LOW (ref 3.5–5.1)
Sodium: 136 mmol/L (ref 135–145)
Total Bilirubin: 2 mg/dL — ABNORMAL HIGH (ref 0.3–1.2)
Total Protein: 6.9 g/dL (ref 6.5–8.1)

## 2018-12-29 LAB — INFLUENZA PANEL BY PCR (TYPE A & B)
Influenza A By PCR: NEGATIVE
Influenza B By PCR: NEGATIVE

## 2018-12-29 LAB — TROPONIN I (HIGH SENSITIVITY)
Troponin I (High Sensitivity): 17 ng/L (ref <18)
Troponin I (High Sensitivity): 18 ng/L — ABNORMAL HIGH (ref <18)

## 2018-12-29 LAB — C-REACTIVE PROTEIN: CRP: 14.9 mg/dL — ABNORMAL HIGH (ref <1.0)

## 2018-12-29 MED ORDER — POTASSIUM CHLORIDE CRYS ER 20 MEQ PO TBCR
40.0000 meq | EXTENDED_RELEASE_TABLET | Freq: Every day | ORAL | Status: DC
  Administered 2018-12-29: 40 meq via ORAL
  Filled 2018-12-29: qty 2

## 2018-12-29 MED ORDER — MORPHINE SULFATE (PF) 2 MG/ML IV SOLN
2.0000 mg | INTRAVENOUS | Status: DC | PRN
  Administered 2018-12-29: 20:00:00 2 mg via INTRAVENOUS
  Filled 2018-12-29: qty 1

## 2018-12-29 MED ORDER — MAGNESIUM SULFATE IN D5W 1-5 GM/100ML-% IV SOLN
1.0000 g | Freq: Once | INTRAVENOUS | Status: AC
  Administered 2018-12-29: 11:00:00 1 g via INTRAVENOUS
  Filled 2018-12-29: qty 100

## 2018-12-29 MED ORDER — DOXYCYCLINE HYCLATE 100 MG PO TABS
100.0000 mg | ORAL_TABLET | Freq: Two times a day (BID) | ORAL | Status: DC
  Administered 2018-12-30 – 2018-12-31 (×3): 100 mg via ORAL
  Filled 2018-12-29 (×3): qty 1

## 2018-12-29 MED ORDER — MORPHINE SULFATE (PF) 2 MG/ML IV SOLN
1.0000 mg | INTRAVENOUS | Status: DC | PRN
  Administered 2018-12-29: 1 mg via INTRAVENOUS

## 2018-12-29 MED ORDER — FUROSEMIDE 10 MG/ML IJ SOLN
40.0000 mg | Freq: Every day | INTRAMUSCULAR | Status: DC
  Administered 2018-12-29: 11:00:00 40 mg via INTRAVENOUS
  Filled 2018-12-29: qty 4

## 2018-12-29 MED ORDER — TAMSULOSIN HCL 0.4 MG PO CAPS
0.4000 mg | ORAL_CAPSULE | Freq: Every day | ORAL | Status: DC
  Administered 2018-12-29 – 2018-12-31 (×3): 0.4 mg via ORAL
  Filled 2018-12-29 (×3): qty 1

## 2018-12-29 MED ORDER — HYDRALAZINE HCL 25 MG PO TABS
25.0000 mg | ORAL_TABLET | Freq: Four times a day (QID) | ORAL | Status: DC | PRN
  Administered 2018-12-29 – 2018-12-31 (×4): 25 mg via ORAL
  Filled 2018-12-29 (×4): qty 1

## 2018-12-29 MED ORDER — MORPHINE SULFATE (PF) 2 MG/ML IV SOLN
INTRAVENOUS | Status: AC
  Administered 2018-12-29: 2 mg via INTRAVENOUS
  Filled 2018-12-29: qty 1

## 2018-12-29 MED ORDER — TRAZODONE HCL 50 MG PO TABS
150.0000 mg | ORAL_TABLET | Freq: Every evening | ORAL | Status: AC | PRN
  Administered 2018-12-29 – 2018-12-30 (×2): 150 mg via ORAL
  Filled 2018-12-29 (×3): qty 1

## 2018-12-29 NOTE — Progress Notes (Addendum)
Progress Note  Patient Name: Stanley [name] Date of Encounter: 12/29/2018  Primary Cardiologist: Glori Bickers, MD  Subjective   Spoke with patient via telephone - feeling a little better this morning. Less SOB, remains around a "5/10" if ranking breathing. Still with vague chest discomfort (chronic for patient). Slept well. He is being re-tested for Covid this AM as he states IM has high suspicion for the virus based on clinical presentation.  Inpatient Medications    Scheduled Meds: . atorvastatin  40 mg Oral Daily  . busPIRone  10 mg Oral Daily  . carvedilol  25 mg Oral BID WC  . digoxin  0.125 mg Oral Daily  . escitalopram  20 mg Oral Daily  . furosemide  40 mg Intravenous Daily  . heparin  5,000 Units Subcutaneous Q8H  . isosorbide-hydrALAZINE  1 tablet Oral TID  . potassium chloride SA  40 mEq Oral Daily  . spironolactone  12.5 mg Oral Daily   Continuous Infusions: . azithromycin    . cefTRIAXone (ROCEPHIN)  IV     PRN Meds: acetaminophen **OR** acetaminophen, hydrALAZINE, nitroGLYCERIN, ondansetron **OR** ondansetron (ZOFRAN) IV, traZODone   Vital Signs    Vitals:   12/29/18 0244 12/29/18 0500 12/29/18 0554 12/29/18 0810  BP: (!) 138/98  (!) 148/103 (!) 163/93  Pulse: 100  86 87  Resp: 18  20 20   Temp: 98.8 F (37.1 C)  98.2 F (36.8 C)   TempSrc: Oral  Oral   SpO2: 100%  100% 100%  Weight:  80.1 kg    Height:        Intake/Output Summary (Last 24 hours) at 12/29/2018 0829 Last data filed at 12/29/2018 0600 Gross per 24 hour  Intake 972.11 ml  Output 2 ml  Net 970.11 ml   Last 3 Weights 12/29/2018 12/29/2018 10/18/2018  Weight (lbs) 176 lb 9.4 oz 177 lb 4 oz 182 lb 12.8 oz  Weight (kg) 80.1 kg 80.4 kg 82.918 kg     Telemetry    Sinus tach previously, now NSR 90s - Personally Reviewed  Physical Exam   Exam pending review with MD.  Labs    High Sensitivity Troponin:   Recent Labs  Lab 12/28/18 1405 12/28/18 1542  TROPONINIHS 17  19*      Cardiac EnzymesNo results for input(s): TROPONINI in the last 168 hours. No results for input(s): TROPIPOC in the last 168 hours.   Chemistry Recent Labs  Lab 12/28/18 1405 12/28/18 1911 12/29/18 0218  NA 143  --  136  K 3.1*  --  3.2*  CL 107  --  103  CO2 23  --  24  GLUCOSE 76  --  107*  BUN 12  --  12  CREATININE 0.98 0.99 0.96  CALCIUM 8.6*  --  8.1*  PROT  --   --  6.9  ALBUMIN  --   --  3.4*  AST  --   --  34  ALT  --   --  24  ALKPHOS  --   --  55  BILITOT  --   --  2.0*  GFRNONAA >60 >60 >60  GFRAA >60 >60 >60  ANIONGAP 13  --  9     Hematology Recent Labs  Lab 12/28/18 1405 12/28/18 1911 12/29/18 0218  WBC 17.9* 17.4* 16.3*  RBC 3.85* 3.76* 3.55*  HGB 13.5 13.1 12.2*  HCT 42.2 39.2 37.4*  MCV 109.6* 104.3* 105.4*  MCH 35.1* 34.8* 34.4*  MCHC 32.0 33.4  32.6  RDW 14.1 13.9 13.8  PLT 216 235 227    BNP Recent Labs  Lab 12/28/18 1405  BNP 464.9*     DDimer  Recent Labs  Lab 12/28/18 1405  DDIMER 1.27*     Radiology    DG Chest 2 View  Result Date: 12/28/2018 CLINICAL DATA:  Chest pain and shortness of breath. EXAM: CHEST - 2 VIEW COMPARISON:  Chest x-ray dated 03/21/2018. FINDINGS: Dense opacity within the RIGHT lower lobe, consistent with pneumonia. LEFT lung is clear. Stable cardiomegaly. LEFT chest wall pacemaker/ICD hardware is stable in alignment. Osseous structures about the chest are unremarkable. IMPRESSION: RIGHT lower lobe pneumonia. Electronically Signed   By: Franki Cabot M.D.   On: 12/28/2018 13:40   CT ANGIO CHEST PE W OR WO CONTRAST  Result Date: 12/28/2018 CLINICAL DATA:  Shortness of breath, fever, elevated D-dimer EXAM: CT ANGIOGRAPHY CHEST WITH CONTRAST TECHNIQUE: Multidetector CT imaging of the chest was performed using the standard protocol during bolus administration of intravenous contrast. Multiplanar CT image reconstructions and MIPs were obtained to evaluate the vascular anatomy. CONTRAST:  53mL OMNIPAQUE  IOHEXOL 350 MG/ML SOLN COMPARISON:  None. FINDINGS: Cardiovascular: There is a optimal opacification of the pulmonary arteries. There is no central,segmental, or subsegmental filling defects within the pulmonary arteries. The heart is normal in size. No pericardial effusion or thickening. No evidence right heart strain. There is normal three-vessel brachiocephalic anatomy without proximal stenosis. The thoracic aorta is normal in appearance. Mediastinum/Nodes: No hilar, mediastinal, or axillary adenopathy. Thyroid gland, trachea, and esophagus demonstrate no significant findings. Lungs/Pleura: Patchy multifocal ground-glass opacities are seen throughout both lungs, predominantly within the lower lungs and right middle lobe. No pleural effusion is seen. No pneumothorax. Upper Abdomen: No acute abnormalities present in the visualized portions of the upper abdomen. Musculoskeletal: No chest wall abnormality. No acute or significant osseous findings. Review of the MIP images confirms the above findings. IMPRESSION: No central, segmental, or subsegmental pulmonary embolism Extensive ground-glass opacities throughout both lungs consistent with multifocal pneumonia. Electronically Signed   By: Prudencio Pair M.D.   On: 12/28/2018 18:59    Cardiac Studies   Last echo 03/2018 IMPRESSIONS 1. The left ventricle has severely reduced systolic function, with an ejection fraction of 25-30%. The cavity size was mildly dilated. There is mildly increased left ventricular wall thickness. Left ventricular diastolic Doppler parameters are  consistent with impaired relaxation. Elevated left atrial and left ventricular end-diastolic pressures The E/e' is >15. Left ventricular diffuse hypokinesis.  2. The right ventricle has normal systolic function. The cavity was mildly enlarged. There is no increase in right ventricular wall thickness.  3. The mitral valve is degenerative. Mild thickening of the mitral valve leaflet. Mild  calcification of the mitral valve leaflet. There is mild to moderate mitral annular calcification present.  4. The aortic valve was not well visualized Mild calcification of the aortic valve.  5. The aortic root and ascending aorta are normal in size and structure.  6. The interatrial septum was not well visualized.  7. When compared to the prior study: 03/09/2016: LVEF 35-40%.  8. No intracardiac thrombi or masses were visualized.  Patient Profile     53 y.o. male with NICM diagnosed around 2006 (felt 2/2 HTN/ETOH), chronic systolic CHF,[employer] in [place] 2015,?patient-reportedPCI to unknown vessel 2011 at outside location (cath 2014 without CAD and normal coronaries by CT 07/2016), ventricular tachycardia, noncompliancewith financial challenges, chronic chest pain of noncardiac source,depression w/ETOH abuse, HTN, gout. He  had had SOB for 2 weeks, but 2 days prior to admission felt significantly worse with DOE, cough, vomiting. He did not take his medicines on 12/21-12/22 due to feeling poorly. He presented to the ED with hypoxia, tachycardia, marked HTN, lactic acidosis, leukocytosis and fever of 101.52F. Rapid and PCR Covid were negative on arrival. CTA shows no PE, but did show extensive ground-glass opacities throughout both lungs consistent with multifocal pneumonia. Clinical picture remains concerning for Covid-19.  Assessment & Plan    1. Acute hypoxic respiratory failure with multifocal PNA - concerning for Covid-19 despite initial negative testing. Procalcitonin is low. Hypoxia out of proportion to CHF - BNP only 464 and CTA without significant fluid otherwise. IM is repeating Covid test this AM.  2. Accelerated HTN - most of home meds resumed. Did not get carvedilol last night so this may be why BP is still elevated. Would follow for now. ? Restart losartan.  3. Possible acute on chronic systolic CHF - IV Lasix x 1 given yesterday and patient reports excellent UOP. IM has ordered  Lasix at 40mg  IV daily this morning. Follow volume status. He is below prior office weight (80.1kg today compared to 82.9kg then). Follow strict I/O's and daily weights.   4. LBBB - was incomplete LBBB before. This will need to be followed as OP - question whether he would be candidate for CRT upgrade with ICD.   5. Chronic chest pain - worsened in the context of PNA. Troponin reassuring against ACS. Prior ischemic testing unrevealing.  6. Hypokalemia - primary team giving additional potassium. Mg 1.8 yesterday -> will add 1g mag sulfate. F/u level in AM.  7. H/o ETOH abuse and depression - depression remains significant per patient. He no longer drinks daily, only on the weekend per his report. Will need resources at the direction of primary care.  8. History of VT - patient denies recent ICD issues. Keep K 4.0 or greater, Mg 2.0 or greater.  For questions or updates, please contact [place] Please consult [email or web address] for contact info under Cardiology/STEMI.  Today's rounding note was started virtually in attempt to reduce patient/provider exposure and conservation of PPE given concern for Covid-19 infection. Signed, Charlie Pitter, PA-C 12/29/2018, 8:29 AM     Today's rounding note was started virtually in attempt to reduce patient/provider exposure and conservation of PPE given concern for Covid-19 infection.    Feels breathing is improved. Reports some dizziness when he stands.  He is alert and oriented, responds to questions appropriately, speaking in full sentences, no respiratory distress.  Blood pressure improved this afternoon, down to 115/81.  He is on coreg and aldactone.  Suspect respiratory issues are due to pneumonia, not volume overload.  Continue treatment of pneumonia.  Donato Heinz, MD

## 2018-12-29 NOTE — ED Notes (Signed)
ED TO INPATIENT HANDOFF REPORT  ED Nurse Name and Phone #: Julietta Batterman   S Name/Age/Gender Stanley [name] 53 y.o. male Room/Bed: WOTF/NONE  Code Status   Code Status: Full Code  Home/SNF/Other Home Patient oriented to: self, place, time and situation Is this baseline? Yes   Triage Complete: Triage complete  Chief Complaint Pneumonia [J18.9]  Triage Note Pt reports intermittent central chest pains with SOb that is worse with movement since Monday. Has CHF, reports 2lb weight loss. Pt had defibrillator.     Allergies Allergies  Allergen Reactions  . Lisinopril Shortness Of Breath  . Apple Swelling    Lip Swelling  . Other Swelling    Nuts - Lip Swelling  . Shellfish Allergy Swelling    Lip Swelling    Level of Care/Admitting Diagnosis ED Disposition    ED Disposition Condition Comment   Admit  Hospital Area: [place] [[id number]]  Level of Care: Telemetry [5]  Admit to tele based on following criteria: Acute CHF  Covid Evaluation: Symptomatic Person Under Investigation (PUI)  Diagnosis: Pneumonia [161096]  Admitting Physician: Domenic Polite [[id number]]  Attending Physician: Domenic Polite [[id number]]  Estimated length of stay: 3 - 4 days  Certification:: I certify this patient will need inpatient services for at least 2 midnights       B Medical/Surgery History Past Medical History:  Diagnosis Date  . Acute renal failure (ARF) ([place])   . Alcohol abuse   . CAD (coronary artery disease)    a. dx unclear, reported PCI in 2011 but cath 2014 normal coronaries and cardiac CT 07/2016 normal coronaries, no mention of stents.  . Chronic chest pain   . Chronic systolic CHF (congestive heart failure) ([place])   . Depression   . Financial difficulties   . Gout   . Gouty arthritis   . History of noncompliance with medical treatment    financial challenges  . Hypertension   . ICD (implantable cardioverter-defibrillator) in place    [place] [employer] implant  done in [place] 2015  . Insomnia   . Nonischemic cardiomyopathy ([place])   . Ventricular tachycardia ([place]) 01/2015   2017 - ATP delivered for sinus tach, degenerated into VT for which he received shock. Recurrent VT 03/2018.   Past Surgical History:  Procedure Laterality Date  . CARDIAC DEFIBRILLATOR PLACEMENT  06/05/13   [employer] Inogen ICD implanted at [facility] in [place] for primary prevention  . HERNIA REPAIR    . PERCUTANEOUS CORONARY STENT INTERVENTION (PCI-S)  2011     A IV Location/Drains/Wounds Patient Lines/Drains/Airways Status   Active Line/Drains/Airways    Name:   Placement date:   Placement time:   Site:   Days:   Peripheral IV 12/28/18 Left Antecubital   12/28/18    1413    Antecubital   1   Peripheral IV 12/28/18 Left Hand   12/28/18    1500    Hand   1          Intake/Output Last 24 hours  Intake/Output Summary (Last 24 hours) at 12/29/2018 0454 Last data filed at 12/28/2018 1853 Gross per 24 hour  Intake 372.11 ml  Output --  Net 372.11 ml    Labs/Imaging Results for orders placed or performed during the hospital encounter of 12/28/18 (from the past 48 hour(s))  Basic metabolic panel     Status: Abnormal   Collection Time: 12/28/18  2:05 PM  Result Value Ref Range   Sodium 143 135 -  145 mmol/L   Potassium 3.1 (L) 3.5 - 5.1 mmol/L   Chloride 107 98 - 111 mmol/L   CO2 23 22 - 32 mmol/L   Glucose, Bld 76 70 - 99 mg/dL   BUN 12 6 - 20 mg/dL   Creatinine, Ser 0.98 0.61 - 1.24 mg/dL   Calcium 8.6 (L) 8.9 - 10.3 mg/dL   GFR calc non Af Amer >60 >60 mL/min   GFR calc Af Amer >60 >60 mL/min   Anion gap 13 5 - 15    Comment: Performed at [facility], [place] [address], [place], [place] [address]  CBC     Status: Abnormal   Collection Time: 12/28/18  2:05 PM  Result Value Ref Range   WBC 17.9 (H) 4.0 - 10.5 K/uL   RBC 3.85 (L) 4.22 - 5.81 MIL/uL   Hemoglobin 13.5 13.0 - 17.0 g/dL   HCT 42.2 39.0 - 52.0 %   MCV 109.6 (H)  80.0 - 100.0 fL   MCH 35.1 (H) 26.0 - 34.0 pg   MCHC 32.0 30.0 - 36.0 g/dL   RDW 14.1 11.5 - 15.5 %   Platelets 216 150 - 400 K/uL   nRBC 0.2 0.0 - 0.2 %    Comment: Performed at [facility], [place] [address], [place], [place] [address]  Troponin I (High Sensitivity)     Status: None   Collection Time: 12/28/18  2:05 PM  Result Value Ref Range   Troponin I (High Sensitivity) 17 <18 ng/L    Comment: (NOTE) Elevated high sensitivity troponin I (hsTnI) values and significant  changes across serial measurements may suggest ACS but many other  chronic and acute conditions are known to elevate hsTnI results.  Refer to the "Links" section for chest pain algorithms and additional  guidance. Performed at [facility], [place] [address], [place], [place] [address]   Brain natriuretic peptide     Status: Abnormal   Collection Time: 12/28/18  2:05 PM  Result Value Ref Range   B Natriuretic Peptide 464.9 (H) 0.0 - 100.0 pg/mL    Comment: Performed at [facility], [place] [address], [place], [place] [address]  D-dimer, quantitative (not at [facility])     Status: Abnormal   Collection Time: 12/28/18  2:05 PM  Result Value Ref Range   D-Dimer, Quant 1.27 (H) 0.00 - 0.50 ug/mL-FEU    Comment: (NOTE) At the manufacturer cut-off of 0.50 ug/mL FEU, this assay has been documented to exclude PE with a sensitivity and negative predictive value of 97 to 99%.  At this time, this assay has not been approved by the FDA to exclude DVT/VTE. Results should be correlated with clinical presentation. Performed at [facility], [place] [address], [place], [place] [address]   Digoxin Level     Status: Abnormal   Collection Time: 12/28/18  2:05 PM  Result Value Ref Range   Digoxin Level <0.2 (L) 0.8 - 2.0 ng/mL    Comment: RESULTS CONFIRMED BY MANUAL DILUTION Performed at [place] [address], [place], [place] [address]    Lactic acid, plasma     Status: Abnormal   Collection Time: 12/28/18  2:05 PM  Result Value Ref Range   Lactic Acid, Venous 3.8 (HH) 0.5 - 1.9 mmol/L    Comment: CRITICAL RESULT CALLED TO, READ BACK BY AND VERIFIED WITH: Natale Lay [id number] @ 0630 BY J SCOTTON Performed at [facility], [address] [place], [place] [address]   SARS CORONAVIRUS 2 (TAT 6-24 HRS) Nasopharyngeal Nasopharyngeal Swab     Status: None   Collection Time: 12/28/18  2:05 PM   Specimen: Nasopharyngeal Swab  Result Value Ref Range   SARS Coronavirus 2 NEGATIVE NEGATIVE    Comment: (NOTE) SARS-CoV-2 target nucleic acids are NOT DETECTED. The SARS-CoV-2 RNA is generally detectable in upper and lower respiratory specimens during the acute phase of infection. Negative results do not preclude SARS-CoV-2 infection, do not rule out co-infections with other pathogens, and should not be used as the sole basis for treatment or other patient management decisions. Negative results must be combined with clinical observations, patient history, and epidemiological information. The expected result is Negative. Fact Sheet for Patients: SugarRoll.be Fact Sheet for Healthcare Providers: https://www.woods-mathews.com/ This test is not yet approved or cleared by the [place] FDA and  has been authorized for detection and/or diagnosis of SARS-CoV-2 by FDA under an Emergency Use Authorization (EUA). This EUA will remain  in effect (meaning this test can be used) for the duration of the COVID-19 declaration under Section 56 4(b)(1) of the Act, 21 U.S.C. section 360bbb-3(b)(1), unless the authorization is terminated or revoked sooner. Performed at [facility], [place] [address], [place], [place] [address]   Magnesium     Status: None   Collection Time: 12/28/18  2:05 PM  Result Value Ref Range   Magnesium 1.8 1.7 - 2.4 mg/dL    Comment: Performed at  [facility], [place] [address] [place], [place] [address]  POC SARS Coronavirus 2 Ag-ED - Nasal Swab (BD Veritor Kit)     Status: None   Collection Time: 12/28/18  2:52 PM  Result Value Ref Range   SARS Coronavirus 2 Ag NEGATIVE NEGATIVE    Comment: (NOTE) SARS-CoV-2 antigen NOT DETECTED.  Negative results are presumptive.  Negative results do not preclude SARS-CoV-2 infection and should not be used as the sole basis for treatment or other patient management decisions, including infection  control decisions, particularly in the presence of clinical signs and  symptoms consistent with COVID-19, or in those who have been in contact with the virus.  Negative results must be combined with clinical observations, patient history, and epidemiological information. The expected result is Negative. Fact Sheet for Patients: PodPark.tn Fact Sheet for Healthcare Providers: GiftContent.is This test is not yet approved or cleared by the [place] FDA and  has been authorized for detection and/or diagnosis of SARS-CoV-2 by FDA under an Emergency Use Authorization (EUA).  This EUA will remain in effect (meaning this test can be used) for the duration of  the COVID-19 de claration under Section 564(b)(1) of the Act, 21 U.S.C. section 360bbb-3(b)(1), unless the authorization is terminated or revoked sooner.   Lactic acid, plasma     Status: Abnormal   Collection Time: 12/28/18  3:42 PM  Result Value Ref Range   Lactic Acid, Venous 2.8 (HH) 0.5 - 1.9 mmol/L    Comment: CRITICAL VALUE NOTED.  VALUE IS CONSISTENT WITH PREVIOUSLY REPORTED AND CALLED VALUE. Performed at [facility], [place] [address] [place], [place] [address]   Troponin I (High Sensitivity)     Status: Abnormal   Collection Time: 12/28/18  3:42 PM  Result Value Ref Range   Troponin I (High Sensitivity) 19 (H) <18 ng/L    Comment:  (NOTE) Elevated high sensitivity troponin I (hsTnI) values and significant  changes across serial measurements may suggest  ACS but many other  chronic and acute conditions are known to elevate hsTnI results.  Refer to the "Links" section for chest pain algorithms and additional  guidance. Performed at [facility], [place] [address], [place], [place] [address]   Urinalysis, Routine w reflex microscopic     Status: Abnormal   Collection Time: 12/28/18  3:42 PM  Result Value Ref Range   Color, Urine COLORLESS (A) YELLOW   APPearance CLEAR CLEAR   Specific Gravity, Urine 1.004 (L) 1.005 - 1.030   pH 6.0 5.0 - 8.0   Glucose, UA NEGATIVE NEGATIVE mg/dL   Hgb urine dipstick NEGATIVE NEGATIVE   Bilirubin Urine NEGATIVE NEGATIVE   Ketones, ur NEGATIVE NEGATIVE mg/dL   Protein, ur NEGATIVE NEGATIVE mg/dL   Nitrite NEGATIVE NEGATIVE   Leukocytes,Ua NEGATIVE NEGATIVE    Comment: Performed at [place] [address] [place], [place] [address]  C-reactive protein     Status: Abnormal   Collection Time: 12/28/18  3:42 PM  Result Value Ref Range   CRP 8.3 (H) <1.0 mg/dL    Comment: Performed at [facility], [place] [address], [place], [place] [address]  Ferritin     Status: None   Collection Time: 12/28/18  3:42 PM  Result Value Ref Range   Ferritin 336 24 - 336 ng/mL    Comment: Performed at [facility], [place] [address] [place], [place] [address]  Procalcitonin - Baseline     Status: None   Collection Time: 12/28/18  3:42 PM  Result Value Ref Range   Procalcitonin 0.16 ng/mL    Comment:        Interpretation: PCT (Procalcitonin) <= 0.5 ng/mL: Systemic infection (sepsis) is not likely. Local bacterial infection is possible. (NOTE)       Sepsis PCT Algorithm           Lower Respiratory Tract                                      Infection PCT Algorithm    ----------------------------      ----------------------------         PCT < 0.25 ng/mL                PCT < 0.10 ng/mL         Strongly encourage             Strongly discourage   discontinuation of antibiotics    initiation of antibiotics    ----------------------------     -----------------------------       PCT 0.25 - 0.50 ng/mL            PCT 0.10 - 0.25 ng/mL               OR       >80% decrease in PCT            Discourage initiation of                                            antibiotics      Encourage discontinuation           of antibiotics    ----------------------------     -----------------------------  PCT >= 0.50 ng/mL              PCT 0.26 - 0.50 ng/mL               AND        <80% decrease in PCT             Encourage initiation of                                             antibiotics       Encourage continuation           of antibiotics    ----------------------------     -----------------------------        PCT >= 0.50 ng/mL                  PCT > 0.50 ng/mL               AND         increase in PCT                  Strongly encourage                                      initiation of antibiotics    Strongly encourage escalation           of antibiotics                                     -----------------------------                                           PCT <= 0.25 ng/mL                                                 OR                                        > 80% decrease in PCT                                     Discontinue / Do not initiate                                             antibiotics Performed at [place] [address] [place], [place] [address]   CBC     Status: Abnormal   Collection Time: 12/28/18  7:11 PM  Result Value Ref Range   WBC 17.4 (H) 4.0 - 10.5 K/uL   RBC 3.76 (L) 4.22 - 5.81 MIL/uL   Hemoglobin 13.1 13.0 - 17.0 g/dL   HCT 39.2 39.0 -  52.0 %   MCV 104.3 (H) 80.0 - 100.0 fL   MCH 34.8 (H) 26.0 - 34.0 pg   MCHC 33.4  30.0 - 36.0 g/dL   RDW 13.9 11.5 - 15.5 %   Platelets 235 150 - 400 K/uL   nRBC 0.3 (H) 0.0 - 0.2 %    Comment: Performed at [facility], [place] [address] [place], [place] [address]  Creatinine, serum     Status: None   Collection Time: 12/28/18  7:11 PM  Result Value Ref Range   Creatinine, Ser 0.99 0.61 - 1.24 mg/dL   GFR calc non Af Amer >60 >60 mL/min   GFR calc Af Amer >60 >60 mL/min    Comment: Performed at [facility], [place] [address], [place], [place] [address]   DG Chest 2 View  Result Date: 12/28/2018 CLINICAL DATA:  Chest pain and shortness of breath. EXAM: CHEST - 2 VIEW COMPARISON:  Chest x-ray dated 03/21/2018. FINDINGS: Dense opacity within the RIGHT lower lobe, consistent with pneumonia. LEFT lung is clear. Stable cardiomegaly. LEFT chest wall pacemaker/ICD hardware is stable in alignment. Osseous structures about the chest are unremarkable. IMPRESSION: RIGHT lower lobe pneumonia. Electronically Signed   By: Franki Cabot M.D.   On: 12/28/2018 13:40   CT ANGIO CHEST PE W OR WO CONTRAST  Result Date: 12/28/2018 CLINICAL DATA:  Shortness of breath, fever, elevated D-dimer EXAM: CT ANGIOGRAPHY CHEST WITH CONTRAST TECHNIQUE: Multidetector CT imaging of the chest was performed using the standard protocol during bolus administration of intravenous contrast. Multiplanar CT image reconstructions and MIPs were obtained to evaluate the vascular anatomy. CONTRAST:  55m OMNIPAQUE IOHEXOL 350 MG/ML SOLN COMPARISON:  None. FINDINGS: Cardiovascular: There is a optimal opacification of the pulmonary arteries. There is no central,segmental, or subsegmental filling defects within the pulmonary arteries. The heart is normal in size. No pericardial effusion or thickening. No evidence right heart strain. There is normal three-vessel brachiocephalic anatomy without proximal stenosis. The thoracic aorta is normal in appearance. Mediastinum/Nodes: No hilar,  mediastinal, or axillary adenopathy. Thyroid gland, trachea, and esophagus demonstrate no significant findings. Lungs/Pleura: Patchy multifocal ground-glass opacities are seen throughout both lungs, predominantly within the lower lungs and right middle lobe. No pleural effusion is seen. No pneumothorax. Upper Abdomen: No acute abnormalities present in the visualized portions of the upper abdomen. Musculoskeletal: No chest wall abnormality. No acute or significant osseous findings. Review of the MIP images confirms the above findings. IMPRESSION: No central, segmental, or subsegmental pulmonary embolism Extensive ground-glass opacities throughout both lungs consistent with multifocal pneumonia. Electronically Signed   By: BPrudencio PairM.D.   On: 12/28/2018 18:59    Pending Labs Unresulted Labs (From admission, onward)    Start     Ordered   12/29/18 0500  Comprehensive metabolic panel  Tomorrow morning,   R     12/28/18 1858   12/29/18 0500  CBC  Tomorrow morning,   R     12/28/18 1858   12/28/18 1530  Urine culture  ONCE - STAT,   STAT     12/28/18 1530   12/28/18 1345  Blood culture (routine x 2)  BLOOD CULTURE X 2,   STAT     12/28/18 1345          Vitals/Pain Today's Vitals   12/28/18 2138 12/28/18 2230 12/28/18 2232 12/28/18 2339  BP: (!) 174/103 (!) 177/95 (!) 177/95 (!) 163/91  Pulse: (!) 102 100 96 92  Resp: (!) 28 (!)  21 (!) 23 (!) 27  Temp: 100.1 F (37.8 C)     TempSrc: Oral     SpO2: 97% 96% 96% 96%  PainSc: 2        Isolation Precautions No active isolations  Medications Medications  potassium chloride SA (KLOR-CON) CR tablet 40 mEq (40 mEq Oral Given 12/28/18 1654)  azithromycin (ZITHROMAX) 500 mg in sodium chloride 0.9 % 250 mL IVPB (has no administration in time range)  cefTRIAXone (ROCEPHIN) 1 g in sodium chloride 0.9 % 100 mL IVPB (has no administration in time range)  atorvastatin (LIPITOR) tablet 40 mg (40 mg Oral Given 12/28/18 2008)  carvedilol (COREG)  tablet 25 mg (has no administration in time range)  digoxin (LANOXIN) tablet 0.125 mg (0.125 mg Oral Given 12/28/18 2008)  isosorbide-hydrALAZINE (BIDIL) 20-37.5 MG per tablet 1 tablet (1 tablet Oral Given 12/29/18 0004)  nitroGLYCERIN (NITROSTAT) SL tablet 0.4 mg (has no administration in time range)  spironolactone (ALDACTONE) tablet 12.5 mg (12.5 mg Oral Given 12/28/18 2008)  busPIRone (BUSPAR) tablet 10 mg (10 mg Oral Given 12/28/18 2017)  escitalopram (LEXAPRO) tablet 20 mg (20 mg Oral Given 12/28/18 2017)  traZODone (DESYREL) tablet 150 mg (has no administration in time range)  potassium chloride SA (KLOR-CON) CR tablet 40 mEq (40 mEq Oral Given 12/28/18 2018)  heparin injection 5,000 Units (5,000 Units Subcutaneous Given 12/29/18 0004)  acetaminophen (TYLENOL) tablet 650 mg (has no administration in time range)    Or  acetaminophen (TYLENOL) suppository 650 mg (has no administration in time range)  ondansetron (ZOFRAN) tablet 4 mg (has no administration in time range)    Or  ondansetron (ZOFRAN) injection 4 mg (has no administration in time range)  sodium chloride (PF) 0.9 % injection (has no administration in time range)  aspirin chewable tablet 324 mg (324 mg Oral Given 12/28/18 1414)  furosemide (LASIX) injection 80 mg (80 mg Intravenous Given 12/28/18 1412)  cefTRIAXone (ROCEPHIN) 1 g in sodium chloride 0.9 % 100 mL IVPB (0 g Intravenous Stopped 12/28/18 1610)  azithromycin (ZITHROMAX) 500 mg in sodium chloride 0.9 % 250 mL IVPB (0 mg Intravenous Stopped 12/28/18 1853)  iohexol (OMNIPAQUE) 350 MG/ML injection 100 mL (80 mLs Intravenous Contrast Given 12/28/18 1841)    Mobility walks with device Moderate fall risk   Focused Assessments Cardiac Assessment Handoff:    No results found for: CKTOTAL, CKMB, CKMBINDEX, TROPONINI Lab Results  Component Value Date   DDIMER 1.27 (H) 12/28/2018   Does the Patient currently have chest pain? Yes  , Pulmonary Assessment  Handoff:  Lung sounds:   O2 Device: Nasal Cannula O2 Flow Rate (L/min): 4 L/min      R Recommendations: See Admitting Provider Note  Report given to:   Additional Notes:

## 2018-12-29 NOTE — Progress Notes (Signed)
Paged by patient's nurse regarding severe 12 out 10 chest pain. Chest pain started around 6:05 and has been unremitting for the past 20 min. Instructed to give 3rd sublingual nitroglycerine. Also instructed to give another 1 mg IV morphine (1 mg previously given).   Reviewed his record, h/o NICM s/p ICD. Last cath was in Sep 2014 which showed clean coronary arteries. I spoke with Dr. Gardiner Rhyme and reviewed his EKG. Today's EKG does show TWI in inferior and anterior leads. There was question of sepsis and possible COVID even though his COVID test was negative. Therefore there is possibility of pericarditis or even myocarditis. Will order PRN morphine. Dr. Gardiner Rhyme agrees there was no STEMI. Will trend hs Troponin x 3, repeat EKG in 30 min to make sure there is no evolutionary changes.   Will copy and past his previous cath report below.

## 2018-12-29 NOTE — Progress Notes (Addendum)
44 RN called into the room, patient complaining of chest pain 10/10. Patient seems to be in distress. Attending, Cardiologist and RRT paged. Pt given sublingual nitro tabs x3 as seen in MAR. Also given IV morphine as per MD orders. STAT Troponin 1 ordered. Vitals stable during episode, oxygen saturation 100%, RR 24, HR 88, despite pts complaint that he cant breath. BP 98/66. EKG results reported to MD with NSR.  @1835  patient states that his chest pain is now 4/10 after medications, does not seem to be in distress, states he is now comfortable. Will continue to monitor and preform repeat EKG.RRT reported to the bedside. Per RRT and MD, patient is stable.

## 2018-12-29 NOTE — Progress Notes (Signed)
   12/29/18 1815 12/29/18 1820  Vitals  BP 98/66 116/76  BP Location Right Arm Right Arm  BP Method Automatic Automatic  Patient Position (if appropriate) Lying Lying  Pulse Rate 80 86  Pulse Rate Source Monitor Monitor  Oxygen Therapy  SpO2 100 % 100 %  O2 Device Nasal Cannula Nasal Cannula  O2 Flow Rate (L/min) 3 L/min 2 L/min

## 2018-12-29 NOTE — Significant Event (Signed)
Rapid Response Event Note  Overview: Time Called: 1822 Arrival Time: [id number] Event Type: Cardiac, Other (Comment)(Chest pain) Notified by [facility] RN that patient was having chest pain 10/10. Nitroglycerin tablet given sublingual x 2, 2mins apart initially given. Then third nitroglycerin tablet was eventually given. Nasal Cannula placed on patient at 2L for comfort, morphine 2mg  given at 1814. EKG completed. [facility] MD Notified and Cardiology MD notified as well. Upon arrival patient's pain decreased to 5/10.  Initial Focused Assessment: Neuro: Patient alert and oriented, able to follow command with ease. Chest pain 5/10. Cardiac: HR 86-NSR, per EKG no ST elevation but patient did have long prolonged QTC, and possible anterolateral ischemia. Cardiologist notified of results per bedside RN. BP WNL, slightly hypotensive after nitro tablets given but BP WNL at this time.  Pulmonary: Patient not in any respiratory distress, 2LNC for comfort, O2 Sats 100%, breath sounds clear and diminished throughout lung fields.   Interventions: Chest pain protocol followed  Troponins ordered per MD  Plan of Care (if not transferred): Pain decreased from 10/10 to 5/10. Patient stable at this time. Continue to monitor patient for chest pain discomfort. If patient's pain comes back or increases, please call Rapid Response RN. If patient has any change in clinical status or MEW increases to Red, please call Rapid Response RN.         Renika Shiflet C

## 2018-12-29 NOTE — Progress Notes (Addendum)
[facility]                                                                              Patient Demographics  Stanley [name], is a 53 y.o. male, DOB - November 30, 1965, JKK:[id number]  Admit date - 12/28/2018   Admitting Physician Domenic Polite, MD  Outpatient Primary MD for the patient is Gildardo Pounds, NP  Outpatient specialists:   LOS - 1  days   Medical records reviewed and are as summarized below:    Chief Complaint  Patient presents with  . Chest Pain  . Shortness of Breath       Brief summary   Patient is a 53 year old male with history of nonischemic cardiomyopathy, chronic systolic CHF, EF 71%, history of V. tach with ICD, noncompliance, chronic chest pain, history of alcohol use, depression, hypertension presented with shortness of breath, chest pain, weakness.  Patient reported that his symptoms started on Sunday, 4 days prior to admission.  He developed weakness, loss of appetite, insomnia, coughing and shortness of breath has been worsening.  He reported chronic intermittent chest pain.  Denies any loss of sense of smell and taste.  Had nausea and vomiting but no diarrhea.  States he lives alone, however had gone to [place] as his father was sick, went on [place] train. In ED, patient was noted to be hypoxic, 86 to 88% on room air, requiring 3 to 4 L of O2 via nasal cannula, tachycardiac with marked hypotension, fever of 101.1 F.  Chest x-ray showed right lower lobe pneumonia. COVID-19 test was negative, elevated D-dimer hence a CT angiogram of the chest was done which showed multifocal pneumonia.   Assessment & Plan    Principal Problem:   Acute respiratory failure with hypoxia (HCC) with multifocal pneumonia, with sepsis -Patient met sepsis criteria secondary to fevers, hypoxia, tachycardia, hypertension, leukocytosis, source likely due to multifocal pneumonia -Chest x-ray showed right lower lobe pneumonia.  Procalcitonin mildly elevated,  D-dimer 1.27, CT angiogram of the chest showed groundglass opacities throughout both lungs consistent with multifocal pneumonia -Follow blood cultures, urine Legionella antigen, urine strep antigen, repeat COVID-19 test, flu panel -Continue IV Zithromax, Rocephin, wean O2 as tolerated -Placed on incentive spirometry -CRP elevated at 8.3, repeat CRP, D-dimer today -Still hypoxic, was on 3 L, now weaning down to 2 L this morning  Active Problems: Acute on chronic systolic CHF -Received 80 mg IV Lasix in ED, per patient shortness of breath is mildly improving today -2D echo in 03/2018 showed EF of 25 to 30%, left ventricular diffuse hypokinesis -Continue IV Lasix 40 mg daily, continue strict I's and O's and daily weights.  Cardiology following -Does not appear to be significantly volume overloaded.  Continue Coreg, digoxin, BiDil, Aldactone    Nonischemic cardiomyopathy ([place]), history of VT (ventricular tachycardia) ([place]), chronic chest pain -with AICD, cardiology following  Hypokalemia -Replaced  Accelerated hypertension -Resumed outpatient meds including Coreg, BiDil, Aldactone, placed on IV Lasix, hydralazine IV as needed's with parameters  History of anxiety/depression Continue buspirone  Addendum: EKG reviewed, prolonged Qtc 599, discontinued trazodone, azithromax, lexapro - will follow Qtc - cardiology following  Code Status: Full CODE STATUS DVT Prophylaxis: Heparin subcu Family Communication: Discussed all imaging results, lab results, explained to the patient   Disposition Plan: Remains inpatient, still hypoxic, wean O2  Time Spent in minutes 35 minutes  Procedures:  None  Consultants:   Cardiology  Antimicrobials:   Anti-infectives (From admission, onward)   Start     Dose/Rate Route Frequency Ordered Stop   12/29/18 1600  azithromycin (ZITHROMAX) 500 mg in sodium chloride 0.9 % 250 mL IVPB     500 mg 250 mL/hr over 60 Minutes Intravenous Every 24 hours  12/28/18 1858     12/29/18 1500  cefTRIAXone (ROCEPHIN) 1 g in sodium chloride 0.9 % 100 mL IVPB     1 g 200 mL/hr over 30 Minutes Intravenous Every 24 hours 12/28/18 1858     12/28/18 1430  cefTRIAXone (ROCEPHIN) 1 g in sodium chloride 0.9 % 100 mL IVPB     1 g 200 mL/hr over 30 Minutes Intravenous  Once 12/28/18 1424 12/28/18 1610   12/28/18 1430  azithromycin (ZITHROMAX) 500 mg in sodium chloride 0.9 % 250 mL IVPB     500 mg 250 mL/hr over 60 Minutes Intravenous  Once 12/28/18 1424 12/28/18 1853         Medications  Scheduled Meds: . atorvastatin  40 mg Oral Daily  . busPIRone  10 mg Oral Daily  . carvedilol  25 mg Oral BID WC  . digoxin  0.125 mg Oral Daily  . escitalopram  20 mg Oral Daily  . furosemide  40 mg Intravenous Daily  . heparin  5,000 Units Subcutaneous Q8H  . isosorbide-hydrALAZINE  1 tablet Oral TID  . potassium chloride SA  40 mEq Oral Daily  . spironolactone  12.5 mg Oral Daily   Continuous Infusions: . azithromycin    . cefTRIAXone (ROCEPHIN)  IV    . magnesium sulfate bolus IVPB 1 g (12/29/18 1033)   PRN Meds:.acetaminophen **OR** acetaminophen, hydrALAZINE, nitroGLYCERIN, ondansetron **OR** ondansetron (ZOFRAN) IV, traZODone      Subjective:   Stanley [name] was seen and examined today.  Slightly better from yesterday, shortness of breath is improving.  Had 1 BM this morning, watery.  Afebrile this morning.  Chronic chest pain, persisting.  No abdominal pain.  Objective:   Vitals:   12/29/18 0244 12/29/18 0500 12/29/18 0554 12/29/18 0810  BP: (!) 138/98  (!) 148/103 (!) 163/93  Pulse: 100  86 87  Resp: '18  20 20  '$ Temp: 98.8 F (37.1 C)  98.2 F (36.8 C)   TempSrc: Oral  Oral   SpO2: 100%  100% 100%  Weight:  80.1 kg    Height:        Intake/Output Summary (Last 24 hours) at 12/29/2018 1056 Last data filed at 12/29/2018 1035 Gross per 24 hour  Intake 972.11 ml  Output 52 ml  Net 920.11 ml     Wt Readings from Last 3  Encounters:  12/29/18 80.1 kg  10/18/18 82.9 kg  09/26/18 86.5 kg     Exam  General: Alert and oriented x 3, NAD  Eyes:   HEENT:  Atraumatic, normocephalic  Cardiovascular: S1 S2 auscultated, RRR  Respiratory: Decreased breath sound at the bases  Gastrointestinal: BS, soft, nontender, nondistended, + bowel sounds  Ext: no pedal edema bilaterally  Neuro: No new deficits  Musculoskeletal: No digital cyanosis, clubbing  Skin: No rashes  Psych: Normal affect and demeanor, alert and oriented x3    Data Reviewed:  I  have personally reviewed following labs and imaging studies  Micro Results Recent Results (from the past 240 hour(s))  Blood culture (routine x 2)     Status: None (Preliminary result)   Collection Time: 12/28/18  2:05 PM   Specimen: BLOOD  Result Value Ref Range Status   Specimen Description   Final    BLOOD LEFT ANTECUBITAL Performed at [facility] [address] [place], [place] [address]    Special Requests   Final    BOTTLES DRAWN AEROBIC AND ANAEROBIC Blood Culture adequate volume Performed at [facility] [address], [place], [place] [address]    Culture   Final    NO GROWTH < 24 HOURS Performed at [facility] [address], [place], [place] [address]    Report Status PENDING  Incomplete  SARS CORONAVIRUS 2 (TAT 6-24 HRS) Nasopharyngeal Nasopharyngeal Swab     Status: None   Collection Time: 12/28/18  2:05 PM   Specimen: Nasopharyngeal Swab  Result Value Ref Range Status   SARS Coronavirus 2 NEGATIVE NEGATIVE Final    Comment: (NOTE) SARS-CoV-2 target nucleic acids are NOT DETECTED. The SARS-CoV-2 RNA is generally detectable in upper and lower respiratory specimens during the acute phase of infection. Negative results do not preclude SARS-CoV-2 infection, do not rule out co-infections with other pathogens, and should not be used as the sole basis for treatment or other patient  management decisions. Negative results must be combined with clinical observations, patient history, and epidemiological information. The expected result is Negative. Fact Sheet for Patients: SugarRoll.be Fact Sheet for Healthcare Providers: https://www.woods-mathews.com/ This test is not yet approved or cleared by the [place] FDA and  has been authorized for detection and/or diagnosis of SARS-CoV-2 by FDA under an Emergency Use Authorization (EUA). This EUA will remain  in effect (meaning this test can be used) for the duration of the COVID-19 declaration under Section 56 4(b)(1) of the Act, 21 U.S.C. section 360bbb-3(b)(1), unless the authorization is terminated or revoked sooner. Performed at [facility], [place] [address], [place], [place] [address]   Blood culture (routine x 2)     Status: None (Preliminary result)   Collection Time: 12/28/18  3:00 PM   Specimen: Right Antecubital; Blood  Result Value Ref Range Status   Specimen Description   Final    RIGHT ANTECUBITAL Performed at [facility] [address], [place], [place] [address]    Special Requests   Final    BOTTLES DRAWN AEROBIC AND ANAEROBIC Blood Culture adequate volume Performed at [facility] [address], [place], [place] [address]    Culture   Final    NO GROWTH < 24 HOURS Performed at [facility] [address] [place], [place] [address]    Report Status PENDING  Incomplete    Radiology Reports DG Chest 2 View  Result Date: 12/28/2018 CLINICAL DATA:  Chest pain and shortness of breath. EXAM: CHEST - 2 VIEW COMPARISON:  Chest x-ray dated 03/21/2018. FINDINGS: Dense opacity within the RIGHT lower lobe, consistent with pneumonia. LEFT lung is clear. Stable cardiomegaly. LEFT chest wall pacemaker/ICD hardware is stable in alignment. Osseous structures about the chest are unremarkable. IMPRESSION: RIGHT lower  lobe pneumonia. Electronically Signed   By: Franki Cabot M.D.   On: 12/28/2018 13:40   CT ANGIO CHEST PE W OR WO CONTRAST  Result Date: 12/28/2018 CLINICAL DATA:  Shortness of breath, fever, elevated  D-dimer EXAM: CT ANGIOGRAPHY CHEST WITH CONTRAST TECHNIQUE: Multidetector CT imaging of the chest was performed using the standard protocol during bolus administration of intravenous contrast. Multiplanar CT image reconstructions and MIPs were obtained to evaluate the vascular anatomy. CONTRAST:  74m OMNIPAQUE IOHEXOL 350 MG/ML SOLN COMPARISON:  None. FINDINGS: Cardiovascular: There is a optimal opacification of the pulmonary arteries. There is no central,segmental, or subsegmental filling defects within the pulmonary arteries. The heart is normal in size. No pericardial effusion or thickening. No evidence right heart strain. There is normal three-vessel brachiocephalic anatomy without proximal stenosis. The thoracic aorta is normal in appearance. Mediastinum/Nodes: No hilar, mediastinal, or axillary adenopathy. Thyroid gland, trachea, and esophagus demonstrate no significant findings. Lungs/Pleura: Patchy multifocal ground-glass opacities are seen throughout both lungs, predominantly within the lower lungs and right middle lobe. No pleural effusion is seen. No pneumothorax. Upper Abdomen: No acute abnormalities present in the visualized portions of the upper abdomen. Musculoskeletal: No chest wall abnormality. No acute or significant osseous findings. Review of the MIP images confirms the above findings. IMPRESSION: No central, segmental, or subsegmental pulmonary embolism Extensive ground-glass opacities throughout both lungs consistent with multifocal pneumonia. Electronically Signed   By: BPrudencio PairM.D.   On: 12/28/2018 18:59    Lab Data:  CBC: Recent Labs  Lab 12/28/18 1405 12/28/18 1911 12/29/18 0218  WBC 17.9* 17.4* 16.3*  HGB 13.5 13.1 12.2*  HCT 42.2 39.2 37.4*  MCV 109.6* 104.3* 105.4*    PLT 216 235 2638  Basic Metabolic Panel: Recent Labs  Lab 12/28/18 1405 12/28/18 1911 12/29/18 0218  NA 143  --  136  K 3.1*  --  3.2*  CL 107  --  103  CO2 23  --  24  GLUCOSE 76  --  107*  BUN 12  --  12  CREATININE 0.98 0.99 0.96  CALCIUM 8.6*  --  8.1*  MG 1.8  --   --    GFR: Estimated Creatinine Clearance: 84.7 mL/min (by C-G formula based on SCr of 0.96 mg/dL). Liver Function Tests: Recent Labs  Lab 12/29/18 0218  AST 34  ALT 24  ALKPHOS 55  BILITOT 2.0*  PROT 6.9  ALBUMIN 3.4*   No results for input(s): LIPASE, AMYLASE in the last 168 hours. No results for input(s): AMMONIA in the last 168 hours. Coagulation Profile: No results for input(s): INR, PROTIME in the last 168 hours. Cardiac Enzymes: No results for input(s): CKTOTAL, CKMB, CKMBINDEX, TROPONINI in the last 168 hours. BNP (last 3 results) No results for input(s): PROBNP in the last 8760 hours. HbA1C: No results for input(s): HGBA1C in the last 72 hours. CBG: No results for input(s): GLUCAP in the last 168 hours. Lipid Profile: No results for input(s): CHOL, HDL, LDLCALC, TRIG, CHOLHDL, LDLDIRECT in the last 72 hours. Thyroid Function Tests: No results for input(s): TSH, T4TOTAL, FREET4, T3FREE, THYROIDAB in the last 72 hours. Anemia Panel: Recent Labs    12/28/18 1542  FERRITIN 336   Urine analysis:    Component Value Date/Time   COLORURINE COLORLESS (A) 12/28/2018 1542   APPEARANCEUR CLEAR 12/28/2018 1542   LABSPEC 1.004 (L) 12/28/2018 1542   PHURINE 6.0 12/28/2018 1542   GLUCOSEU NEGATIVE 12/28/2018 1542   HGBUR NEGATIVE 12/28/2018 1542   BILIRUBINUR NEGATIVE 12/28/2018 1542   KETONESUR NEGATIVE 12/28/2018 1542   PROTEINUR NEGATIVE 12/28/2018 1542   NITRITE NEGATIVE 12/28/2018 1542   LEUKOCYTESUR NEGATIVE 12/28/2018 1542     Chenel Wernli M.D. [facility] 12/29/2018,  10:56 AM   Call night coverage person covering after 7pm

## 2018-12-29 NOTE — Progress Notes (Addendum)
Repeat EKG reviewed with Dr. Buford Dresser, further TWI in lateral leads when compare to the earlier EKG. Question possibility of perimyocarditis. Will defer to Dr. Gardiner Rhyme to consider limited echo to check for pericardial effusion tomorrow. For now, continue to trend trop. First trop 17. Nurse to call fellow if second trop is significantly elevated.

## 2018-12-29 NOTE — Plan of Care (Signed)
Plan of care discussed.   

## 2018-12-30 ENCOUNTER — Inpatient Hospital Stay (HOSPITAL_COMMUNITY): Payer: Medicare Other

## 2018-12-30 DIAGNOSIS — I361 Nonrheumatic tricuspid (valve) insufficiency: Secondary | ICD-10-CM

## 2018-12-30 DIAGNOSIS — I340 Nonrheumatic mitral (valve) insufficiency: Secondary | ICD-10-CM

## 2018-12-30 DIAGNOSIS — R079 Chest pain, unspecified: Secondary | ICD-10-CM

## 2018-12-30 LAB — BASIC METABOLIC PANEL
Anion gap: 8 (ref 5–15)
BUN: 20 mg/dL (ref 6–20)
CO2: 27 mmol/L (ref 22–32)
Calcium: 8.4 mg/dL — ABNORMAL LOW (ref 8.9–10.3)
Chloride: 105 mmol/L (ref 98–111)
Creatinine, Ser: 1.35 mg/dL — ABNORMAL HIGH (ref 0.61–1.24)
GFR calc Af Amer: >60 mL/min (ref >60)
GFR calc non Af Amer: 59 mL/min — ABNORMAL LOW (ref >60)
Glucose, Bld: 72 mg/dL (ref 70–99)
Potassium: 3.7 mmol/L (ref 3.5–5.1)
Sodium: 140 mmol/L (ref 135–145)

## 2018-12-30 LAB — CBC
HCT: 35.5 % — ABNORMAL LOW (ref 39.0–52.0)
Hemoglobin: 11.3 g/dL — ABNORMAL LOW (ref 13.0–17.0)
MCH: 34.6 pg — ABNORMAL HIGH (ref 26.0–34.0)
MCHC: 31.8 g/dL (ref 30.0–36.0)
MCV: 108.6 fL — ABNORMAL HIGH (ref 80.0–100.0)
Platelets: 210 10*3/uL (ref 150–400)
RBC: 3.27 MIL/uL — ABNORMAL LOW (ref 4.22–5.81)
RDW: 14.1 % (ref 11.5–15.5)
WBC: 12.9 10*3/uL — ABNORMAL HIGH (ref 4.0–10.5)
nRBC: 0.2 % (ref 0.0–0.2)

## 2018-12-30 LAB — ECHOCARDIOGRAM COMPLETE
Height: 65 in
Weight: 2814.83 oz

## 2018-12-30 LAB — STREP PNEUMONIAE URINARY ANTIGEN: Strep Pneumo Urinary Antigen: NEGATIVE

## 2018-12-30 LAB — URINE CULTURE: Culture: NO GROWTH

## 2018-12-30 LAB — C-REACTIVE PROTEIN: CRP: 13.7 mg/dL — ABNORMAL HIGH (ref <1.0)

## 2018-12-30 LAB — MAGNESIUM: Magnesium: 2.3 mg/dL (ref 1.7–2.4)

## 2018-12-30 LAB — SEDIMENTATION RATE: Sed Rate: 62 mm/hr — ABNORMAL HIGH (ref 0–16)

## 2018-12-30 LAB — SARS CORONAVIRUS 2 (TAT 6-24 HRS): SARS Coronavirus 2: NEGATIVE

## 2018-12-30 MED ORDER — POTASSIUM CHLORIDE CRYS ER 20 MEQ PO TBCR
40.0000 meq | EXTENDED_RELEASE_TABLET | Freq: Every day | ORAL | Status: AC
  Administered 2018-12-30: 40 meq via ORAL
  Filled 2018-12-30: qty 2

## 2018-12-30 MED ORDER — SALINE SPRAY 0.65 % NA SOLN
1.0000 | NASAL | Status: DC | PRN
  Filled 2018-12-30: qty 44

## 2018-12-30 MED ORDER — COLCHICINE 0.6 MG PO TABS
0.6000 mg | ORAL_TABLET | Freq: Every day | ORAL | Status: DC
  Administered 2018-12-30: 0.6 mg via ORAL
  Filled 2018-12-30: qty 1

## 2018-12-30 MED ORDER — OXYCODONE-ACETAMINOPHEN 5-325 MG PO TABS
1.0000 | ORAL_TABLET | Freq: Four times a day (QID) | ORAL | Status: DC | PRN
  Administered 2018-12-30: 1 via ORAL
  Administered 2018-12-31: 2 via ORAL
  Filled 2018-12-30: qty 2
  Filled 2018-12-30: qty 1

## 2018-12-30 MED ORDER — POTASSIUM CHLORIDE CRYS ER 20 MEQ PO TBCR: 20.0000 meq | EXTENDED_RELEASE_TABLET | Freq: Every day | ORAL | Status: DC

## 2018-12-30 NOTE — Plan of Care (Signed)

## 2018-12-30 NOTE — Progress Notes (Signed)
Progress Note  Patient Name: Stanley [name] Date of Encounter: 12/30/2018  Primary Cardiologist: Glori Bickers, MD  Subjective   Developed chest pain overnight, 10/10 pain.  Treated with NTG and morphine.  EKG with diffuse TWI.  High sensitivity troponins 17->18.  Currently reports no chest pain except when he takes a deep breath.  Reports dyspnea is improving.  Inpatient Medications    Scheduled Meds: . atorvastatin  40 mg Oral Daily  . busPIRone  10 mg Oral Daily  . carvedilol  25 mg Oral BID WC  . digoxin  0.125 mg Oral Daily  . doxycycline  100 mg Oral Q12H  . furosemide  40 mg Intravenous Daily  . heparin  5,000 Units Subcutaneous Q8H  . isosorbide-hydrALAZINE  1 tablet Oral TID  . potassium chloride SA  40 mEq Oral Daily  . spironolactone  12.5 mg Oral Daily  . tamsulosin  0.4 mg Oral Daily   Continuous Infusions: . cefTRIAXone (ROCEPHIN)  IV 1 g (12/29/18 1624)   PRN Meds: acetaminophen **OR** acetaminophen, hydrALAZINE, morphine injection, nitroGLYCERIN, ondansetron **OR** ondansetron (ZOFRAN) IV, traZODone   Vital Signs    Vitals:   12/29/18 2024 12/29/18 2218 12/30/18 0422 12/30/18 0457  BP: 108/80 112/83 122/76 134/78  Pulse: 83 73 70 68  Resp: 18 18 18 18   Temp: 98.7 F (37.1 C) 98.8 F (37.1 C) 98 F (36.7 C) 98.4 F (36.9 C)  TempSrc: Oral Oral Oral Oral  SpO2: 99% 100% 100% 100%  Weight:   79.8 kg   Height:        Intake/Output Summary (Last 24 hours) at 12/30/2018 0636 Last data filed at 12/30/2018 0600 Gross per 24 hour  Intake 780 ml  Output 950 ml  Net -170 ml   Last 3 Weights 12/30/2018 12/29/2018 12/29/2018  Weight (lbs) 175 lb 14.8 oz 176 lb 9.4 oz 177 lb 4 oz  Weight (kg) 79.8 kg 80.1 kg 80.4 kg     Telemetry    NSR rate 70-80s- Personally Reviewed  EKG 12/24: NSR, rate 82, QTc 584, diffuse TWI  Physical Exam   GEN:No acute distress. Neck:No JVD Cardiac: RRR Respiratory:diminished breath sounds [id number],  non-distended  MS:No edema Neuro:Nonfocal  Psych: Normal affect   Labs    High Sensitivity Troponin:   Recent Labs  Lab 12/28/18 1405 12/28/18 1542 12/29/18 1836 12/29/18 2018  TROPONINIHS 17 19* 17 18*      Cardiac EnzymesNo results for input(s): TROPONINI in the last 168 hours. No results for input(s): TROPIPOC in the last 168 hours.   Chemistry Recent Labs  Lab 12/28/18 1405 12/28/18 1911 12/29/18 0218  NA 143  --  136  K 3.1*  --  3.2*  CL 107  --  103  CO2 23  --  24  GLUCOSE 76  --  107*  BUN 12  --  12  CREATININE 0.98 0.99 0.96  CALCIUM 8.6*  --  8.1*  PROT  --   --  6.9  ALBUMIN  --   --  3.4*  AST  --   --  34  ALT  --   --  24  ALKPHOS  --   --  55  BILITOT  --   --  2.0*  GFRNONAA >60 >60 >60  GFRAA >60 >60 >60  ANIONGAP 13  --  9     Hematology Recent Labs  Lab 12/28/18 1911 12/29/18 0218 12/30/18 0249  WBC 17.4* 16.3* 12.9*  RBC 3.76* 3.55*  3.27*  HGB 13.1 12.2* 11.3*  HCT 39.2 37.4* 35.5*  MCV 104.3* 105.4* 108.6*  MCH 34.8* 34.4* 34.6*  MCHC 33.4 32.6 31.8  RDW 13.9 13.8 14.1  PLT 235 227 210    BNP Recent Labs  Lab 12/28/18 1405  BNP 464.9*     DDimer  Recent Labs  Lab 12/28/18 1405  DDIMER 1.27*     Radiology    DG Chest 2 View  Result Date: 12/28/2018 CLINICAL DATA:  Chest pain and shortness of breath. EXAM: CHEST - 2 VIEW COMPARISON:  Chest x-ray dated 03/21/2018. FINDINGS: Dense opacity within the RIGHT lower lobe, consistent with pneumonia. LEFT lung is clear. Stable cardiomegaly. LEFT chest wall pacemaker/ICD hardware is stable in alignment. Osseous structures about the chest are unremarkable. IMPRESSION: RIGHT lower lobe pneumonia. Electronically Signed   By: Franki Cabot M.D.   On: 12/28/2018 13:40   CT ANGIO CHEST PE W OR WO CONTRAST  Result Date: 12/28/2018 CLINICAL DATA:  Shortness of breath, fever, elevated D-dimer EXAM: CT ANGIOGRAPHY CHEST WITH CONTRAST TECHNIQUE: Multidetector CT imaging of the  chest was performed using the standard protocol during bolus administration of intravenous contrast. Multiplanar CT image reconstructions and MIPs were obtained to evaluate the vascular anatomy. CONTRAST:  39mL OMNIPAQUE IOHEXOL 350 MG/ML SOLN COMPARISON:  None. FINDINGS: Cardiovascular: There is a optimal opacification of the pulmonary arteries. There is no central,segmental, or subsegmental filling defects within the pulmonary arteries. The heart is normal in size. No pericardial effusion or thickening. No evidence right heart strain. There is normal three-vessel brachiocephalic anatomy without proximal stenosis. The thoracic aorta is normal in appearance. Mediastinum/Nodes: No hilar, mediastinal, or axillary adenopathy. Thyroid gland, trachea, and esophagus demonstrate no significant findings. Lungs/Pleura: Patchy multifocal ground-glass opacities are seen throughout both lungs, predominantly within the lower lungs and right middle lobe. No pleural effusion is seen. No pneumothorax. Upper Abdomen: No acute abnormalities present in the visualized portions of the upper abdomen. Musculoskeletal: No chest wall abnormality. No acute or significant osseous findings. Review of the MIP images confirms the above findings. IMPRESSION: No central, segmental, or subsegmental pulmonary embolism Extensive ground-glass opacities throughout both lungs consistent with multifocal pneumonia. Electronically Signed   By: Prudencio Pair M.D.   On: 12/28/2018 18:59    Cardiac Studies   Last echo 03/2018 IMPRESSIONS 1. The left ventricle has severely reduced systolic function, with an ejection fraction of 25-30%. The cavity size was mildly dilated. There is mildly increased left ventricular wall thickness. Left ventricular diastolic Doppler parameters are  consistent with impaired relaxation. Elevated left atrial and left ventricular end-diastolic pressures The E/e' is >15. Left ventricular diffuse hypokinesis.  2. The right  ventricle has normal systolic function. The cavity was mildly enlarged. There is no increase in right ventricular wall thickness.  3. The mitral valve is degenerative. Mild thickening of the mitral valve leaflet. Mild calcification of the mitral valve leaflet. There is mild to moderate mitral annular calcification present.  4. The aortic valve was not well visualized Mild calcification of the aortic valve.  5. The aortic root and ascending aorta are normal in size and structure.  6. The interatrial septum was not well visualized.  7. When compared to the prior study: 03/09/2016: LVEF 35-40%.  8. No intracardiac thrombi or masses were visualized.  Patient Profile     53 y.o. male with NICM diagnosed around 2006 (felt 2/2 HTN/ETOH), chronic systolic CHF,[employer] in [place] 2015,?patient-reportedPCI to unknown vessel 2011 at outside location (cath  2014 without CAD and normal coronaries by CT 07/2016), ventricular tachycardia, noncompliancewith financial challenges, chronic chest pain of noncardiac source,depression w/ETOH abuse, HTN, gout. He had had SOB for 2 weeks, but 2 days prior to admission felt significantly worse with DOE, cough, vomiting. He did not take his medicines on 12/21-12/22 due to feeling poorly. He presented to the ED with hypoxia, tachycardia, marked HTN, lactic acidosis, leukocytosis and fever of 101.52F. Rapid and PCR Covid were negative on arrival. CTA shows no PE, but did show extensive ground-glass opacities throughout both lungs consistent with multifocal pneumonia. Clinical picture remains concerning for Covid-19.  Assessment & Plan    Acute hypoxic respiratory failure: due to multifocal pneumonia.  CTA chest showed groundglass opacities bilateral lungs consistent with multifocal pneumonia.  On doxycycline and ceftriaxone.  BNP 464, appears euvolemic on exam. -Continue antibiotics per primary team -Given bump in creatinine with diuresis yesterday and patient appears  euvolemic, would hold Lasix for today  Accelerated HTN -BP now controlled on home meds, would continue   Chronic systolic CHF -currently appears euvolemic. He is below prior office weight (79.8kg today compared to 82.9kg then). Follow strict I/O's and daily weights.  -Hold lasix for now -Continue coreg 25 mg BID -Continue bidil 20-37.5 TID -Can continue digoxin for now, will watch renal function -Holding losartan and aldactone given AKI  LBBB - was incomplete LBBB before. This will need to be followed as OP - can consider CRT upgrade with ICD.   Chest pain - reports chronic chest pain, but worsened in the context of PNA. Troponin reassuring against ACS. Prior ischemic testing unrevealing.  Pleuritic component -Pain could be related to pericarditis, limited TTE to evaluate for effusion  Hypokalemia - 82meq administered today.  Will d/c scheduled repletion since holding lasix.  Goal K>4  H/o ETOH abuse and depression - depression remains significant per patient. He no longer drinks daily, only on the weekend per his report. Will need resources at the direction of primary care.  History of VT - patient denies recent ICD issues. Keep K 4.0 or greater, Mg 2.0 or greater.  For questions or updates, please contact [place] Please consult [email or web address] for contact info under Cardiology/STEMI.   Donato Heinz, MD 12/30/2018, 6:36 AM

## 2018-12-30 NOTE — Progress Notes (Signed)
[facility]                                                                              Patient Demographics  Stanley [name], is a 53 y.o. male, DOB - 14-Sep-1965, HOZ:[id number]  Admit date - 12/28/2018   Admitting Physician Domenic Polite, MD  Outpatient Primary MD for the patient is Gildardo Pounds, NP  Outpatient specialists:   LOS - 2  days   Medical records reviewed and are as summarized below:    Chief Complaint  Patient presents with  . Chest Pain  . Shortness of Breath       Brief summary   Patient is a 53 year old male with history of nonischemic cardiomyopathy, chronic systolic CHF, EF 70%, history of V. tach with ICD, noncompliance, chronic chest pain, history of alcohol use, depression, hypertension presented with shortness of breath, chest pain, weakness.  Patient reported that his symptoms started on Sunday, 4 days prior to admission.  He developed weakness, loss of appetite, insomnia, coughing and shortness of breath has been worsening.  He reported chronic intermittent chest pain.  Denies any loss of sense of smell and taste.  Had nausea and vomiting but no diarrhea.  States he lives alone, however had gone to [place] as his father was sick, went on [place] train. In ED, patient was noted to be hypoxic, 86 to 88% on room air, requiring 3 to 4 L of O2 via nasal cannula, tachycardiac with marked hypotension, fever of 101.1 F.  Chest x-ray showed right lower lobe pneumonia. COVID-19 test was negative, elevated D-dimer hence a CT angiogram of the chest was done which showed multifocal pneumonia. COVID-19 test repeated on 12/24, negative  Assessment & Plan    Principal Problem:   Acute respiratory failure with hypoxia (HCC) with multifocal pneumonia, with sepsis -Patient met sepsis criteria secondary to fevers, hypoxia, tachycardia, hypertension, leukocytosis, source likely due to multifocal pneumonia -Chest x-ray showed right lower lobe  pneumonia.  Procalcitonin mildly elevated, D-dimer 1.27, CT angiogram of the chest showed groundglass opacities throughout both lungs consistent with multifocal pneumonia -Cultures negative so far, urine strep antigen negative.  Repeat COVID-19 test negative.  Influenza panel negative. -Continue IV Zithromax, Rocephin, wean O2 as tolerated.  O2 sats 99- 100% on 2 L. -Home O2 evaluation prior to discharge.  Active Problems: Acute on chronic systolic CHF, chest pain -Received 80 mg IV Lasix in ED, per patient shortness of breath is mildly improving today -2D echo in 03/2018 showed EF of 25 to 30%, left ventricular diffuse hypokinesis -Patient was placed on IV Lasix, I's and O's with 800 cc positive.  Creatinine starting to trend up, hold IV Lasix. -Does not appear to be significantly volume overloaded.  Continue Coreg, digoxin, BiDil, Aldactone -Cardiology recommendations reviewed, ESR CRP elevated, pain could be from pericarditis  -Cannot be placed on NSAIDs due to renal insufficiency, placed on colchicine 0.6 mg daily.  Defer to cardiology if patient needs to be placed on prednisone    Nonischemic cardiomyopathy ([facility]), history of VT (ventricular tachycardia) ([facility]), chronic chest pain -with AICD, cardiology following -Follow 2D echo  Hypokalemia Resolved  Accelerated hypertension -Resumed outpatient meds including Coreg, BiDil, Aldactone  History of anxiety/depression Continue buspirone  Prolonged Qtc 599 - discontinued trazodone, azithromax, lexapro - will follow Qtc - cardiology following  - QTC improving, repeat EKG this morning showed corrected QTC 558  Code Status: Full CODE STATUS DVT Prophylaxis: Heparin subcu Family Communication: Discussed all imaging results, lab results, explained to the patient   Disposition Plan: Remains inpatient, still hypoxic, wean O2  Time Spent in minutes 35 minutes  Procedures:  None  Consultants:   Cardiology  Antimicrobials:    Anti-infectives (From admission, onward)   Start     Dose/Rate Route Frequency Ordered Stop   12/30/18 1000  doxycycline (VIBRA-TABS) tablet 100 mg     100 mg Oral Every 12 hours 12/29/18 1855     12/29/18 1600  azithromycin (ZITHROMAX) 500 mg in sodium chloride 0.9 % 250 mL IVPB  Status:  Discontinued     500 mg 250 mL/hr over 60 Minutes Intravenous Every 24 hours 12/28/18 1858 12/29/18 1854   12/29/18 1500  cefTRIAXone (ROCEPHIN) 1 g in sodium chloride 0.9 % 100 mL IVPB     1 g 200 mL/hr over 30 Minutes Intravenous Every 24 hours 12/28/18 1858     12/28/18 1430  cefTRIAXone (ROCEPHIN) 1 g in sodium chloride 0.9 % 100 mL IVPB     1 g 200 mL/hr over 30 Minutes Intravenous  Once 12/28/18 1424 12/28/18 1610   12/28/18 1430  azithromycin (ZITHROMAX) 500 mg in sodium chloride 0.9 % 250 mL IVPB     500 mg 250 mL/hr over 60 Minutes Intravenous  Once 12/28/18 1424 12/28/18 1853         Medications  Scheduled Meds: . atorvastatin  40 mg Oral Daily  . busPIRone  10 mg Oral Daily  . carvedilol  25 mg Oral BID WC  . colchicine  0.6 mg Oral Daily  . digoxin  0.125 mg Oral Daily  . doxycycline  100 mg Oral Q12H  . heparin  5,000 Units Subcutaneous Q8H  . isosorbide-hydrALAZINE  1 tablet Oral TID  . tamsulosin  0.4 mg Oral Daily   Continuous Infusions: . cefTRIAXone (ROCEPHIN)  IV 1 g (12/29/18 1624)   PRN Meds:.acetaminophen **OR** acetaminophen, hydrALAZINE, morphine injection, nitroGLYCERIN, ondansetron **OR** ondansetron (ZOFRAN) IV, traZODone      Subjective:   Stanley [name] was seen and examined today.  Feels somewhat better today, chest pain, shortness of breath is improving.  Afebrile this morning.  Chronic chest pain, persisting.  No abdominal pain, nausea vomiting or diarrhea.  Objective:   Vitals:   12/30/18 0422 12/30/18 0457 12/30/18 0831 12/30/18 1100  BP: 122/76 134/78 (!) 160/101 122/74  Pulse: 70 68 79 72  Resp: '18 18 20 16  '$ Temp: 98 F (36.7 C) 98.4  F (36.9 C) 98.5 F (36.9 C) 98.2 F (36.8 C)  TempSrc: Oral Oral Oral Oral  SpO2: 100% 100% 99% 100%  Weight: 79.8 kg     Height:        Intake/Output Summary (Last 24 hours) at 12/30/2018 1219 Last data filed at 12/30/2018 0600 Gross per 24 hour  Intake 780 ml  Output 900 ml  Net -120 ml     Wt Readings from Last 3 Encounters:  12/30/18 79.8 kg  10/18/18 82.9 kg  09/26/18 86.5 kg    Physical Exam  General: Alert and oriented x 3, NAD  Eyes:   HEENT:    Cardiovascular: S1 S2 clear, RRR. No pedal  edema b/l  Respiratory: CTAB, no wheezing, rales or rhonchi  Gastrointestinal: Soft, nontender, nondistended, NBS  Ext: no pedal edema bilaterally  Neuro: no new deficits  Musculoskeletal: No cyanosis, clubbing  Skin: No rashes  Psych: Normal affect and demeanor, alert and oriented x3    Data Reviewed:  I have personally reviewed following labs and imaging studies  Micro Results Recent Results (from the past 240 hour(s))  Blood culture (routine x 2)     Status: None (Preliminary result)   Collection Time: 12/28/18  2:05 PM   Specimen: BLOOD  Result Value Ref Range Status   Specimen Description   Final    BLOOD LEFT ANTECUBITAL Performed at [facility] [address] [place], [place] [address]    Special Requests   Final    BOTTLES DRAWN AEROBIC AND ANAEROBIC Blood Culture adequate volume Performed at [facility] [address] [place], [place] [address]    Culture   Final    NO GROWTH 2 DAYS Performed at [facility] [address] [place], [place] [address]    Report Status PENDING  Incomplete  SARS CORONAVIRUS 2 (TAT 6-24 HRS) Nasopharyngeal Nasopharyngeal Swab     Status: None   Collection Time: 12/28/18  2:05 PM   Specimen: Nasopharyngeal Swab  Result Value Ref Range Status   SARS Coronavirus 2 NEGATIVE NEGATIVE Final    Comment: (NOTE) SARS-CoV-2 target nucleic acids are NOT DETECTED. The  SARS-CoV-2 RNA is generally detectable in upper and lower respiratory specimens during the acute phase of infection. Negative results do not preclude SARS-CoV-2 infection, do not rule out co-infections with other pathogens, and should not be used as the sole basis for treatment or other patient management decisions. Negative results must be combined with clinical observations, patient history, and epidemiological information. The expected result is Negative. Fact Sheet for Patients: SugarRoll.be Fact Sheet for Healthcare Providers: https://www.woods-mathews.com/ This test is not yet approved or cleared by the [place] FDA and  has been authorized for detection and/or diagnosis of SARS-CoV-2 by FDA under an Emergency Use Authorization (EUA). This EUA will remain  in effect (meaning this test can be used) for the duration of the COVID-19 declaration under Section 56 4(b)(1) of the Act, 21 U.S.C. section 360bbb-3(b)(1), unless the authorization is terminated or revoked sooner. Performed at [facility], [place] [address], [place], [place] [address]   Blood culture (routine x 2)     Status: None (Preliminary result)   Collection Time: 12/28/18  3:00 PM   Specimen: Right Antecubital; Blood  Result Value Ref Range Status   Specimen Description   Final    RIGHT ANTECUBITAL Performed at [facility] [address], [place], [place] [address]    Special Requests   Final    BOTTLES DRAWN AEROBIC AND ANAEROBIC Blood Culture adequate volume Performed at [facility] [address], [place], [place] [address]    Culture   Final    NO GROWTH 2 DAYS Performed at [facility] [address], [place], [place] [address]    Report Status PENDING  Incomplete  Urine culture     Status: None   Collection Time: 12/28/18  3:42 PM   Specimen: In/Out Cath Urine  Result Value Ref Range Status   Specimen  Description   Final    IN/OUT CATH URINE Performed at [facility] [address] [place],  [address]  Special Requests   Final    NONE Performed at [facility], [place] [address], [place], [place] [address]    Culture   Final    NO GROWTH Performed at [facility], [place] [address], [place], [place] [address]    Report Status 12/30/2018 FINAL  Final  SARS CORONAVIRUS 2 (TAT 6-24 HRS) Nasopharyngeal Nasopharyngeal Swab     Status: None   Collection Time: 12/29/18  7:00 PM   Specimen: Nasopharyngeal Swab  Result Value Ref Range Status   SARS Coronavirus 2 NEGATIVE NEGATIVE Final    Comment: (NOTE) SARS-CoV-2 target nucleic acids are NOT DETECTED. The SARS-CoV-2 RNA is generally detectable in upper and lower respiratory specimens during the acute phase of infection. Negative results do not preclude SARS-CoV-2 infection, do not rule out co-infections with other pathogens, and should not be used as the sole basis for treatment or other patient management decisions. Negative results must be combined with clinical observations, patient history, and epidemiological information. The expected result is Negative. Fact Sheet for Patients: SugarRoll.be Fact Sheet for Healthcare Providers: https://www.woods-mathews.com/ This test is not yet approved or cleared by the [place] FDA and  has been authorized for detection and/or diagnosis of SARS-CoV-2 by FDA under an Emergency Use Authorization (EUA). This EUA will remain  in effect (meaning this test can be used) for the duration of the COVID-19 declaration under Section 56 4(b)(1) of the Act, 21 U.S.C. section 360bbb-3(b)(1), unless the authorization is terminated or revoked sooner. Performed at [facility], [place] [address] [place], [place] [address]     Radiology Reports DG Chest 2 View  Result Date: 12/28/2018 CLINICAL DATA:   Chest pain and shortness of breath. EXAM: CHEST - 2 VIEW COMPARISON:  Chest x-ray dated 03/21/2018. FINDINGS: Dense opacity within the RIGHT lower lobe, consistent with pneumonia. LEFT lung is clear. Stable cardiomegaly. LEFT chest wall pacemaker/ICD hardware is stable in alignment. Osseous structures about the chest are unremarkable. IMPRESSION: RIGHT lower lobe pneumonia. Electronically Signed   By: Franki Cabot M.D.   On: 12/28/2018 13:40   CT ANGIO CHEST PE W OR WO CONTRAST  Result Date: 12/28/2018 CLINICAL DATA:  Shortness of breath, fever, elevated D-dimer EXAM: CT ANGIOGRAPHY CHEST WITH CONTRAST TECHNIQUE: Multidetector CT imaging of the chest was performed using the standard protocol during bolus administration of intravenous contrast. Multiplanar CT image reconstructions and MIPs were obtained to evaluate the vascular anatomy. CONTRAST:  68m OMNIPAQUE IOHEXOL 350 MG/ML SOLN COMPARISON:  None. FINDINGS: Cardiovascular: There is a optimal opacification of the pulmonary arteries. There is no central,segmental, or subsegmental filling defects within the pulmonary arteries. The heart is normal in size. No pericardial effusion or thickening. No evidence right heart strain. There is normal three-vessel brachiocephalic anatomy without proximal stenosis. The thoracic aorta is normal in appearance. Mediastinum/Nodes: No hilar, mediastinal, or axillary adenopathy. Thyroid gland, trachea, and esophagus demonstrate no significant findings. Lungs/Pleura: Patchy multifocal ground-glass opacities are seen throughout both lungs, predominantly within the lower lungs and right middle lobe. No pleural effusion is seen. No pneumothorax. Upper Abdomen: No acute abnormalities present in the visualized portions of the upper abdomen. Musculoskeletal: No chest wall abnormality. No acute or significant osseous findings. Review of the MIP images confirms the above findings. IMPRESSION: No central, segmental, or subsegmental  pulmonary embolism Extensive ground-glass opacities throughout both lungs consistent with multifocal pneumonia. Electronically Signed   By: BPrudencio PairM.D.   On: 12/28/2018 18:59    Lab  Data:  CBC: Recent Labs  Lab 12/28/18 1405 12/28/18 1911 12/29/18 0218 12/30/18 0249  WBC 17.9* 17.4* 16.3* 12.9*  HGB 13.5 13.1 12.2* 11.3*  HCT 42.2 39.2 37.4* 35.5*  MCV 109.6* 104.3* 105.4* 108.6*  PLT 216 235 227 370   Basic Metabolic Panel: Recent Labs  Lab 12/28/18 1405 12/28/18 1911 12/29/18 0218 12/30/18 0249  NA 143  --  136 140  K 3.1*  --  3.2* 3.7  CL 107  --  103 105  CO2 23  --  24 27  GLUCOSE 76  --  107* 72  BUN 12  --  12 20  CREATININE 0.98 0.99 0.96 1.35*  CALCIUM 8.6*  --  8.1* 8.4*  MG 1.8  --   --  2.3   GFR: Estimated Creatinine Clearance: 60.2 mL/min (A) (by C-G formula based on SCr of 1.35 mg/dL (H)). Liver Function Tests: Recent Labs  Lab 12/29/18 0218  AST 34  ALT 24  ALKPHOS 55  BILITOT 2.0*  PROT 6.9  ALBUMIN 3.4*   No results for input(s): LIPASE, AMYLASE in the last 168 hours. No results for input(s): AMMONIA in the last 168 hours. Coagulation Profile: No results for input(s): INR, PROTIME in the last 168 hours. Cardiac Enzymes: No results for input(s): CKTOTAL, CKMB, CKMBINDEX, TROPONINI in the last 168 hours. BNP (last 3 results) No results for input(s): PROBNP in the last 8760 hours. HbA1C: No results for input(s): HGBA1C in the last 72 hours. CBG: No results for input(s): GLUCAP in the last 168 hours. Lipid Profile: No results for input(s): CHOL, HDL, LDLCALC, TRIG, CHOLHDL, LDLDIRECT in the last 72 hours. Thyroid Function Tests: No results for input(s): TSH, T4TOTAL, FREET4, T3FREE, THYROIDAB in the last 72 hours. Anemia Panel: Recent Labs    12/28/18 1542  FERRITIN 336   Urine analysis:    Component Value Date/Time   COLORURINE COLORLESS (A) 12/28/2018 1542   APPEARANCEUR CLEAR 12/28/2018 1542   LABSPEC 1.004 (L)  12/28/2018 1542   PHURINE 6.0 12/28/2018 1542   GLUCOSEU NEGATIVE 12/28/2018 1542   HGBUR NEGATIVE 12/28/2018 1542   BILIRUBINUR NEGATIVE 12/28/2018 1542   KETONESUR NEGATIVE 12/28/2018 1542   PROTEINUR NEGATIVE 12/28/2018 1542   NITRITE NEGATIVE 12/28/2018 1542   LEUKOCYTESUR NEGATIVE 12/28/2018 1542     Dylan Monforte M.D. [facility] 12/30/2018, 12:19 PM   Call night coverage person covering after 7pm

## 2018-12-30 NOTE — Progress Notes (Signed)
  Echocardiogram 2D Echocardiogram has been performed.  Stanley [name] 12/30/2018, 12:05 PM

## 2018-12-30 NOTE — Plan of Care (Signed)

## 2018-12-31 LAB — CBC
HCT: 36.1 % — ABNORMAL LOW (ref 39.0–52.0)
Hemoglobin: 11.4 g/dL — ABNORMAL LOW (ref 13.0–17.0)
MCH: 34.5 pg — ABNORMAL HIGH (ref 26.0–34.0)
MCHC: 31.6 g/dL (ref 30.0–36.0)
MCV: 109.4 fL — ABNORMAL HIGH (ref 80.0–100.0)
Platelets: 215 10*3/uL (ref 150–400)
RBC: 3.3 MIL/uL — ABNORMAL LOW (ref 4.22–5.81)
RDW: 13.9 % (ref 11.5–15.5)
WBC: 11.7 10*3/uL — ABNORMAL HIGH (ref 4.0–10.5)
nRBC: 0 % (ref 0.0–0.2)

## 2018-12-31 LAB — CUP PACEART REMOTE DEVICE CHECK
Date Time Interrogation Session: 20201223055822
Implantable Lead Implant Date: 20150601
Implantable Lead Location: 753860
Implantable Lead Model: 295
Implantable Lead Serial Number: 134196
Implantable Pulse Generator Implant Date: 20150601
Pulse Gen Serial Number: 190689

## 2018-12-31 LAB — MAGNESIUM: Magnesium: 2.1 mg/dL (ref 1.7–2.4)

## 2018-12-31 LAB — BASIC METABOLIC PANEL
Anion gap: 11 (ref 5–15)
BUN: 16 mg/dL (ref 6–20)
CO2: 21 mmol/L — ABNORMAL LOW (ref 22–32)
Calcium: 8.6 mg/dL — ABNORMAL LOW (ref 8.9–10.3)
Chloride: 105 mmol/L (ref 98–111)
Creatinine, Ser: 1.03 mg/dL (ref 0.61–1.24)
GFR calc Af Amer: >60 mL/min (ref >60)
GFR calc non Af Amer: >60 mL/min (ref >60)
Glucose, Bld: 109 mg/dL — ABNORMAL HIGH (ref 70–99)
Potassium: 3.9 mmol/L (ref 3.5–5.1)
Sodium: 137 mmol/L (ref 135–145)

## 2018-12-31 LAB — GLUCOSE, CAPILLARY: Glucose-Capillary: 87 mg/dL (ref 70–99)

## 2018-12-31 MED ORDER — TAMSULOSIN HCL 0.4 MG PO CAPS: 0.4000 mg | ORAL_CAPSULE | Freq: Every day | ORAL | 3 refills | Status: DC

## 2018-12-31 MED ORDER — FUROSEMIDE 40 MG PO TABS: 80.0000 mg | ORAL_TABLET | Freq: Every day | ORAL | Status: DC | PRN

## 2018-12-31 MED ORDER — CEFPODOXIME PROXETIL 100 MG PO TABS: 100.0000 mg | ORAL_TABLET | Freq: Two times a day (BID) | ORAL | 0 refills | Status: AC

## 2018-12-31 MED ORDER — FUROSEMIDE 40 MG PO TABS: 80.0000 mg | ORAL_TABLET | ORAL | Status: DC

## 2018-12-31 MED ORDER — ASPIRIN-ACETAMINOPHEN-CAFFEINE 250-250-65 MG PO TABS
2.0000 | ORAL_TABLET | Freq: Once | ORAL | Status: AC
  Administered 2018-12-31: 2 via ORAL
  Filled 2018-12-31: qty 2

## 2018-12-31 MED ORDER — ASPIRIN-ACETAMINOPHEN-CAFFEINE 250-250-65 MG PO TABS: 1.0000 | ORAL_TABLET | Freq: Four times a day (QID) | ORAL | 0 refills | Status: DC | PRN

## 2018-12-31 MED ORDER — AMIODARONE HCL 200 MG PO TABS
200.0000 mg | ORAL_TABLET | Freq: Every day | ORAL | Status: DC
  Administered 2018-12-31: 200 mg via ORAL
  Filled 2018-12-31: qty 1

## 2018-12-31 MED ORDER — DOXYCYCLINE HYCLATE 100 MG PO TABS: 100.0000 mg | ORAL_TABLET | Freq: Two times a day (BID) | ORAL | 0 refills | Status: AC

## 2018-12-31 MED ORDER — SPIRONOLACTONE 12.5 MG HALF TABLET
12.5000 mg | ORAL_TABLET | Freq: Every day | ORAL | Status: DC
  Administered 2018-12-31: 12.5 mg via ORAL
  Filled 2018-12-31: qty 1

## 2018-12-31 MED ORDER — LOSARTAN POTASSIUM 50 MG PO TABS: 50.0000 mg | ORAL_TABLET | Freq: Every day | ORAL | 3 refills | Status: DC

## 2018-12-31 NOTE — Progress Notes (Signed)
ICD remote 

## 2018-12-31 NOTE — Progress Notes (Signed)
AVS given to patient and explained at the bedside. Medications and follow up appointments have been explained with pt verbalizing understanding.  

## 2018-12-31 NOTE — Discharge Summary (Signed)
Physician Discharge Summary   Patient ID: Stanley [name] MRN: [id number] DOB/AGE: 1965-12-28 53 y.o.  Admit date: 12/28/2018 Discharge date: 12/31/2018  Primary Care Physician:  Gildardo Pounds, NP   Recommendations for Outpatient Follow-up:  1. Follow up with PCP in 1-2 weeks  Home Health: None  Equipment/Devices:   Discharge Condition: stable CODE STATUS: FUL Diet recommendation: Heart healthy diet   Discharge Diagnoses:   . Acute respiratory failure with hypoxia ([place]) . Multifocal pneumonia with sepsis . Acute on chronic systolic and diastolic CHF ([place]) . Prolonged QTC . Accelerated hypertension . Gout . ETOH abuse . Chronic chest pain . Lobar pneumonia ([place]) . Chronic chest pain  Consults: Cardiology    Allergies:   Allergies  Allergen Reactions  . Lisinopril Shortness Of Breath  . Apple Swelling    Lip Swelling  . Other Swelling    Nuts - Lip Swelling  . Shellfish Allergy Swelling    Lip Swelling     DISCHARGE MEDICATIONS: Allergies as of 12/31/2018      Reactions   Lisinopril Shortness Of Breath   Apple Swelling   Lip Swelling   Other Swelling   Nuts - Lip Swelling   Shellfish Allergy Swelling   Lip Swelling      Medication List    STOP taking these medications   escitalopram 20 MG tablet Commonly known as: LEXAPRO   traZODone 150 MG tablet Commonly known as: DESYREL     TAKE these medications   amiodarone 200 MG tablet Commonly known as: PACERONE Take 1 tablet (200 mg total) by mouth daily.   aspirin-acetaminophen-caffeine 250-250-65 MG tablet Commonly known as: EXCEDRIN MIGRAINE Take 1 tablet by mouth every 6 (six) hours as needed for headache or migraine (excedrin also availabe OTC).   atorvastatin 40 MG tablet Commonly known as: LIPITOR Take 1 tablet (40 mg total) by mouth daily.   BiDil 20-37.5 MG tablet Generic drug: isosorbide-hydrALAZINE Take 1 tablet by mouth 3 (three) times daily.   busPIRone 10 MG  tablet Commonly known as: BUSPAR Take 1 tablet (10 mg total) by mouth daily.   carvedilol 25 MG tablet Commonly known as: COREG Take 1 tablet (25 mg total) by mouth 2 (two) times daily with a meal.   cefpodoxime 100 MG tablet Commonly known as: VANTIN Take 1 tablet (100 mg total) by mouth 2 (two) times daily for 6 days.   digoxin 0.125 MG tablet Commonly known as: LANOXIN Take 1 tablet (0.125 mg total) by mouth daily.   doxycycline 100 MG tablet Commonly known as: VIBRA-TABS Take 1 tablet (100 mg total) by mouth 2 (two) times daily for 6 days.   furosemide 80 MG tablet Commonly known as: LASIX Take 80 mg by mouth See admin instructions. Take 1 tablet (80 mg total) by mouth every Monday, Wednesday, and Friday. May also take 1 tablet (80 mg total) as needed for swelling.   losartan 50 MG tablet Commonly known as: COZAAR Take 1 tablet (50 mg total) by mouth daily. Start taking on: January 02, 2019 What changed: These instructions start on January 02, 2019. If you are unsure what to do until then, ask your doctor or other care provider.   nitroGLYCERIN 0.4 MG SL tablet Commonly known as: NITROSTAT Place 1 tablet (0.4 mg total) under the tongue every 5 (five) minutes x 3 doses as needed for chest pain.   potassium chloride SA 20 MEQ tablet Commonly known as: KLOR-CON Take 1 tablet (20 mEq total) by mouth daily.  spironolactone 25 MG tablet Commonly known as: ALDACTONE Take 12.5 mg by mouth daily.   tamsulosin 0.4 MG Caps capsule Commonly known as: FLOMAX Take 1 capsule (0.4 mg total) by mouth daily.        Brief H and P: For complete details please refer to admission H and P, but in brief Patient is a 53 year old male with history of nonischemic cardiomyopathy, chronic systolic CHF, EF 75%, history of V. tach with ICD, noncompliance, chronic chest pain, history of alcohol use, depression, hypertension presented with shortness of breath, chest pain, weakness.  Patient  reported that his symptoms started on Sunday, 4 days prior to admission.  He developed weakness, loss of appetite, insomnia, coughing and shortness of breath has been worsening.  He reported chronic intermittent chest pain.  Denies any loss of sense of smell and taste.  Had nausea and vomiting but no diarrhea.  States he lives alone, however had gone to [place] as his father was sick, went on [place] train. In ED, patient was noted to be hypoxic, 86 to 88% on room air, requiring 3 to 4 L of O2 via nasal cannula, tachycardiac with marked hypotension, fever of 101.1 F.  Chest x-ray showed right lower lobe pneumonia. COVID-19 test was negative, elevated D-dimer hence a CT angiogram of the chest was done which showed multifocal pneumonia. COVID-19 test repeated on 12/24, negative  Hospital Course:   Acute respiratory failure with hypoxia (HCC) with multifocal pneumonia, with sepsis -Patient met sepsis criteria secondary to fevers, hypoxia, tachycardia, hypertension, leukocytosis, source likely due to multifocal pneumonia -Chest x-ray showed right lower lobe pneumonia.  Procalcitonin was mildly elevated, D-dimer 1.27. COVID-19 test on admission was negative, but was repeated again after 24 hours and remained negative. - CT angiogram of the chest showed groundglass opacities throughout both lungs consistent with multifocal pneumonia -Urine strep antigen negative, Influenza panel negative. -Patient was placed on IV Zithromax and Rocephin however was transition to doxycycline and Rocephin due to prolonged QTC. -O2 sats 98% on room air   Acute on chronic systolic and diastolic CHF, chest pain -Received 80 mg IV Lasix in ED, shortness of breath significantly improved.  Currently on room air. -2D echo in 03/2018 showed EF of 25 to 30%, left ventricular diffuse hypokinesis -Patient was placed on IV Lasix, creatinine started to trend up hence Lasix was held and transition to oral Lasix at home dose.   Cardiology was consulted. -Does not appear to be significantly volume overloaded.  Continue Coreg, digoxin, BiDil, Aldactone -2D echo showed EF of 30 to 64%, grade 1 diastolic dysfunction     Nonischemic cardiomyopathy ([place]), history of VT (ventricular tachycardia) ([place]), chronic chest pain -With AICD, cardiology recommended restarting home dose of amiodarone -2D echo showed EF of 30 to 35% with severely decreased left ventricular function  Hypokalemia Resolved  Accelerated hypertension -Resumed outpatient meds including Coreg, BiDil, Aldactone  History of anxiety/depression Continue buspirone  Prolonged Qtc 599 - discontinued trazodone, azithromax, lexapro - QTC improving, repeat EKG this morning showed corrected QTC 558 Patient recommended to follow-up with cardiology and PCP   Day of Discharge S: No acute complaints, feeling a lot better, at baseline, on room air.  Hoping to go home, no fevers.  BP (!) 171/92 (BP Location: Left Arm)   Pulse 68   Temp 97.8 F (36.6 C) (Oral)   Resp 18   Ht _0  (1.651 m)   Wt 79.8 kg   SpO2 98%   BMI 29.28  kg/m   Physical Exam: General: Alert and awake oriented x3 not in any acute distress. HEENT:  CVS: S1-S2 clear no murmur rubs or gallops Chest: clear to auscultation bilaterally, no wheezing rales or rhonchi Abdomen: soft nontender, nondistended, normal bowel sounds Extremities: no cyanosis, clubbing or edema noted bilaterally Neuro: Cranial nerves II-XII intact, no focal neurological deficits   The results of significant diagnostics from this hospitalization (including imaging, microbiology, ancillary and laboratory) are listed below for reference.      Procedures/Studies:  DG Chest 2 View  Result Date: 12/28/2018 CLINICAL DATA:  Chest pain and shortness of breath. EXAM: CHEST - 2 VIEW COMPARISON:  Chest x-ray dated 03/21/2018. FINDINGS: Dense opacity within the RIGHT lower lobe, consistent with pneumonia. LEFT lung  is clear. Stable cardiomegaly. LEFT chest wall pacemaker/ICD hardware is stable in alignment. Osseous structures about the chest are unremarkable. IMPRESSION: RIGHT lower lobe pneumonia. Electronically Signed   By: Franki Cabot M.D.   On: 12/28/2018 13:40   CT ANGIO CHEST PE W OR WO CONTRAST  Result Date: 12/28/2018 CLINICAL DATA:  Shortness of breath, fever, elevated D-dimer EXAM: CT ANGIOGRAPHY CHEST WITH CONTRAST TECHNIQUE: Multidetector CT imaging of the chest was performed using the standard protocol during bolus administration of intravenous contrast. Multiplanar CT image reconstructions and MIPs were obtained to evaluate the vascular anatomy. CONTRAST:  44m OMNIPAQUE IOHEXOL 350 MG/ML SOLN COMPARISON:  None. FINDINGS: Cardiovascular: There is a optimal opacification of the pulmonary arteries. There is no central,segmental, or subsegmental filling defects within the pulmonary arteries. The heart is normal in size. No pericardial effusion or thickening. No evidence right heart strain. There is normal three-vessel brachiocephalic anatomy without proximal stenosis. The thoracic aorta is normal in appearance. Mediastinum/Nodes: No hilar, mediastinal, or axillary adenopathy. Thyroid gland, trachea, and esophagus demonstrate no significant findings. Lungs/Pleura: Patchy multifocal ground-glass opacities are seen throughout both lungs, predominantly within the lower lungs and right middle lobe. No pleural effusion is seen. No pneumothorax. Upper Abdomen: No acute abnormalities present in the visualized portions of the upper abdomen. Musculoskeletal: No chest wall abnormality. No acute or significant osseous findings. Review of the MIP images confirms the above findings. IMPRESSION: No central, segmental, or subsegmental pulmonary embolism Extensive ground-glass opacities throughout both lungs consistent with multifocal pneumonia. Electronically Signed   By: BPrudencio PairM.D.   On: 12/28/2018 18:59    ECHOCARDIOGRAM COMPLETE  Result Date: 12/30/2018   ECHOCARDIOGRAM REPORT   Patient Name:   Stanley SELBEDate of Exam: 12/30/2018 Medical Rec #:  [id number]        Height:       65.0 in Accession #:    [id number]       Weight:       175.9 lb Date of Birth:  7July 29, 1967        BSA:          1.87 m Patient Age:    89years          BP:           134/78 mmHg Patient Gender: M                 HR:           65 bpm. Exam Location:  Inpatient Procedure: 2D Echo, Cardiac Doppler, Color Doppler and Strain Analysis Indications:    Pericardial effusion 423.9  History:        Patient has prior history of Echocardiogram examinations, most  recent 03/22/2018. Cardiomyopathy and CHF, Signs/Symptoms:Chest                 Pain; Risk Factors:Hypertension and Non-Smoker.  Sonographer:    Paulita Fujita RDCS Referring Phys: [id number] [place]  1. Left ventricular ejection fraction, by visual estimation, is 30 to 35%. The left ventricle has severely decreased function. There is borderline left ventricular hypertrophy.  2. Elevated left ventricular end-diastolic pressure.  3. Left ventricular diastolic parameters are consistent with Grade I diastolic dysfunction (impaired relaxation).  4. Mildly dilated left ventricular internal cavity size.  5. The left ventricle demonstrates global hypokinesis.  6. Global right ventricle has normal systolic function.The right ventricular size is mildly enlarged. No increase in right ventricular wall thickness.  7. Left atrial size was normal.  8. Right atrial size was normal.  9. Mild mitral annular calcification. 10. The mitral valve is degenerative. Moderate mitral valve regurgitation. 11. The tricuspid valve is grossly normal. 12. The aortic valve is tricuspid. Aortic valve regurgitation is not visualized. No evidence of aortic valve sclerosis or stenosis. 13. The pulmonic valve was grossly normal. Pulmonic valve regurgitation is not visualized. 14.  Normal pulmonary artery systolic pressure. 15. A pacer wire is visualized. 16. The inferior vena cava is normal in size with greater than 50% respiratory variability, suggesting right atrial pressure of 3 mmHg. FINDINGS  Left Ventricle: Left ventricular ejection fraction, by visual estimation, is 30 to 35%. The left ventricle has severely decreased function. The left ventricle demonstrates global hypokinesis. The left ventricular internal cavity size was mildly dilated left ventricle. There is borderline left ventricular hypertrophy. Left ventricular diastolic parameters are consistent with Grade I diastolic dysfunction (impaired relaxation). Elevated left ventricular end-diastolic pressure. Right Ventricle: The right ventricular size is mildly enlarged. No increase in right ventricular wall thickness. Global RV systolic function is has normal systolic function. The tricuspid regurgitant velocity is 2.19 m/s, and with an assumed right atrial  pressure of 3 mmHg, the estimated right ventricular systolic pressure is normal at 22.2 mmHg. Left Atrium: Left atrial size was normal in size. Right Atrium: Right atrial size was normal in size Pericardium: There is no evidence of pericardial effusion. Mitral Valve: The mitral valve is degenerative in appearance. There is mild thickening of the mitral valve leaflet(s). Mild mitral annular calcification. Moderate mitral valve regurgitation. Tricuspid Valve: The tricuspid valve is grossly normal. Tricuspid valve regurgitation is mild. Aortic Valve: The aortic valve is tricuspid. Aortic valve regurgitation is not visualized. The aortic valve is structurally normal, with no evidence of sclerosis or stenosis. Pulmonic Valve: The pulmonic valve was grossly normal. Pulmonic valve regurgitation is not visualized. Pulmonic regurgitation is not visualized. Aorta: The aortic root is normal in size and structure. Venous: The inferior vena cava is normal in size with greater than 50%  respiratory variability, suggesting right atrial pressure of 3 mmHg. IAS/Shunts: No atrial level shunt detected by color flow Doppler. Additional Comments: A pacer wire is visualized.  LEFT VENTRICLE PLAX 2D LVIDd:         5.90 cm       Diastology LVIDs:         4.90 cm       LV e' lateral:   5.27 cm/s LV PW:         1.00 cm       LV E/e' lateral: 12.8 LV IVS:        1.00 cm       LV e' medial:  4.05 cm/s LVOT diam:     1.80 cm       LV E/e' medial:  16.7 LV SV:         60 ml LV SV Index:   31.19 LVOT Area:     2.54 cm  LV Volumes (MOD) LV area d, A2C:    49.30 cm LV area d, A4C:    43.60 cm LV area s, A2C:    40.40 cm LV area s, A4C:    33.50 cm LV major d, A2C:   9.26 cm LV major d, A4C:   8.46 cm LV major s, A2C:   8.51 cm LV major s, A4C:   7.73 cm LV vol d, MOD A2C: 215.0 ml LV vol d, MOD A4C: 183.0 ml LV vol s, MOD A2C: 156.0 ml LV vol s, MOD A4C: 119.0 ml LV SV MOD A2C:     59.0 ml LV SV MOD A4C:     183.0 ml LV SV MOD BP:      64.4 ml RIGHT VENTRICLE RV S prime:     15.20 cm/s TAPSE (M-mode): 2.1 cm LEFT ATRIUM             Index       RIGHT ATRIUM           Index LA diam:        4.20 cm 2.24 cm/m  RA Area:     17.80 cm LA Vol (A2C):   49.4 ml 26.37 ml/m RA Volume:   50.90 ml  27.17 ml/m LA Vol (A4C):   58.6 ml 31.28 ml/m LA Biplane Vol: 59.4 ml 31.71 ml/m  AORTIC VALVE LVOT Vmax:   106.00 cm/s LVOT Vmean:  74.000 cm/s LVOT VTI:    0.220 m  AORTA Ao Root diam: 2.50 cm MITRAL VALVE                        TRICUSPID VALVE MV Area (PHT): 3.85 cm             TR Peak grad:   19.2 mmHg MV PHT:        57.13 msec           TR Vmax:        219.00 cm/s MV Decel Time: 197 msec MR Peak grad: 95.3 mmHg             SHUNTS MR Mean grad: 59.0 mmHg             Systemic VTI:  0.22 m MR Vmax:      488.00 cm/s           Systemic Diam: 1.80 cm MR Vmean:     364.0 cm/s MV E velocity: 67.70 cm/s 103 cm/s MV A velocity: 89.10 cm/s 70.3 cm/s MV E/A ratio:  0.76       1.5  Kate Sable MD Electronically signed by  Kate Sable MD Signature Date/Time: 12/30/2018/4:26:45 PM    Final        LAB RESULTS: Basic Metabolic Panel: Recent Labs  Lab 12/30/18 0249 12/31/18 0310  NA 140 137  K 3.7 3.9  CL 105 105  CO2 27 21*  GLUCOSE 72 109*  BUN 20 16  CREATININE 1.35* 1.03  CALCIUM 8.4* 8.6*  MG 2.3 2.1   Liver Function Tests: Recent Labs  Lab 12/29/18 0218  AST 34  ALT 24  ALKPHOS 55  BILITOT 2.0*  PROT 6.9  ALBUMIN 3.4*  No results for input(s): LIPASE, AMYLASE in the last 168 hours. No results for input(s): AMMONIA in the last 168 hours. CBC: Recent Labs  Lab 12/30/18 0249 12/31/18 0310  WBC 12.9* 11.7*  HGB 11.3* 11.4*  HCT 35.5* 36.1*  MCV 108.6* 109.4*  PLT 210 215   Cardiac Enzymes: No results for input(s): CKTOTAL, CKMB, CKMBINDEX, TROPONINI in the last 168 hours. BNP: Invalid input(s): POCBNP CBG: Recent Labs  Lab 12/31/18 0741  GLUCAP 87      Disposition and Follow-up: Discharge Instructions    (HEART FAILURE PATIENTS) Call MD:  Anytime you have any of the following symptoms: 1) 3 pound weight gain in 24 hours or 5 pounds in 1 week 2) shortness of breath, with or without a dry hacking cough 3) swelling in the hands, feet or stomach 4) if you have to sleep on extra pillows at night in order to breathe.   Complete by: As directed    Diet - low sodium heart healthy   Complete by: As directed    Discharge instructions   Complete by: As directed    Please continue to take AMIODARONE for your heart. You need to discuss with your doctor regarding lexapro and trazodone, both prolong Qtc and can affect rhythm of your heart.   Increase activity slowly   Complete by: As directed        DISPOSITION: Home   DISCHARGE FOLLOW-UP Follow-up Information    Gildardo Pounds, NP. Schedule an appointment as soon as possible for a visit in 2 week(s).   Specialty: Nurse Practitioner Contact information: [place] [address] [phone]         Bensimhon, Shaune Pascal, MD Follow up in 2 week(s).   Specialty: Cardiology Contact information: [address] [place] [address] [phone]        Thompson Grayer, MD. Schedule an appointment as soon as possible for a visit in 2 week(s).   Specialty: Cardiology Contact information: [place] [address] [phone]            Time coordinating discharge:  35 minutes  Signed:   Estill Cotta M.D. [facility] 12/31/2018, 12:44 PM

## 2018-12-31 NOTE — Progress Notes (Addendum)
Progress Note  Patient Name: Stanley [name] Date of Encounter: 12/31/2018  Primary Cardiologist: Glori Bickers, MD   Subjective   Had episode of pleuritic type chronic intermittent chest discomfort earlier in this admission.  Overall he is smiling, breathing is much better.  Inpatient Medications    Scheduled Meds: . atorvastatin  40 mg Oral Daily  . busPIRone  10 mg Oral Daily  . carvedilol  25 mg Oral BID WC  . digoxin  0.125 mg Oral Daily  . doxycycline  100 mg Oral Q12H  . [START ON 01/02/2019] furosemide  80 mg Oral Q M,W,F  . heparin  5,000 Units Subcutaneous Q8H  . isosorbide-hydrALAZINE  1 tablet Oral TID  . spironolactone  12.5 mg Oral Daily  . tamsulosin  0.4 mg Oral Daily   Continuous Infusions: . cefTRIAXone (ROCEPHIN)  IV 1 g (12/30/18 1506)   PRN Meds: acetaminophen **OR** acetaminophen, furosemide, hydrALAZINE, morphine injection, nitroGLYCERIN, ondansetron **OR** ondansetron (ZOFRAN) IV, oxyCODONE-acetaminophen, sodium chloride   Vital Signs    Vitals:   12/30/18 1506 12/30/18 1658 12/30/18 2101 12/31/18 0440  BP: 139/79 135/80 (!) 156/88 (!) 160/90  Pulse: 73 72 69 72  Resp:   20 18  Temp:   98.1 F (36.7 C) 97.8 F (36.6 C)  TempSrc:   Oral Oral  SpO2: 97% 96% 98% 98%  Weight:      Height:        Intake/Output Summary (Last 24 hours) at 12/31/2018 0845 Last data filed at 12/31/2018 0500 Gross per 24 hour  Intake 830 ml  Output 300 ml  Net 530 ml   Last 3 Weights 12/30/2018 12/29/2018 12/29/2018  Weight (lbs) 175 lb 14.8 oz 176 lb 9.4 oz 177 lb 4 oz  Weight (kg) 79.8 kg 80.1 kg 80.4 kg      Telemetry    Sinus rhythm/sinus bradycardia 59, no VT- Personally Reviewed  ECG    Sinus rhythm LBBB QTC 590- Personally Reviewed  Physical Exam   GEN: No acute distress.   Neck: No JVD Cardiac: RRR, no murmurs, rubs, or gallops.  Respiratory: Clear to auscultation bilaterally. GI: Soft, nontender, non-distended  MS: No edema; No  deformity. Neuro:  Nonfocal  Psych: Normal affect   Labs    High Sensitivity Troponin:   Recent Labs  Lab 12/28/18 1405 12/28/18 1542 12/29/18 1836 12/29/18 2018  TROPONINIHS 17 19* 17 18*      Chemistry Recent Labs  Lab 12/29/18 0218 12/30/18 0249 12/31/18 0310  NA 136 140 137  K 3.2* 3.7 3.9  CL 103 105 105  CO2 24 27 21*  GLUCOSE 107* 72 109*  BUN 12 20 16   CREATININE 0.96 1.35* 1.03  CALCIUM 8.1* 8.4* 8.6*  PROT 6.9  --   --   ALBUMIN 3.4*  --   --   AST 34  --   --   ALT 24  --   --   ALKPHOS 55  --   --   BILITOT 2.0*  --   --   GFRNONAA >60 59* >60  GFRAA >60 >60 >60  ANIONGAP 9 8 11      Hematology Recent Labs  Lab 12/29/18 0218 12/30/18 0249 12/31/18 0310  WBC 16.3* 12.9* 11.7*  RBC 3.55* 3.27* 3.30*  HGB 12.2* 11.3* 11.4*  HCT 37.4* 35.5* 36.1*  MCV 105.4* 108.6* 109.4*  MCH 34.4* 34.6* 34.5*  MCHC 32.6 31.8 31.6  RDW 13.8 14.1 13.9  PLT 227 210 215  BNP Recent Labs  Lab 12/28/18 1405  BNP 464.9*     DDimer  Recent Labs  Lab 12/28/18 1405  DDIMER 1.27*     Radiology    ECHOCARDIOGRAM COMPLETE  Result Date: 12/30/2018   ECHOCARDIOGRAM REPORT   Patient Name:   Stanley [name] Date of Exam: 12/30/2018 Medical Rec #:  [id number]         Height:       65.0 in Accession #:    [id number]        Weight:       175.9 lb Date of Birth:  1965/06/11         BSA:          1.87 m Patient Age:    53 years          BP:           134/78 mmHg Patient Gender: M                 HR:           65 bpm. Exam Location:  Inpatient Procedure: 2D Echo, Cardiac Doppler, Color Doppler and Strain Analysis Indications:    Pericardial effusion 423.9  History:        Patient has prior history of Echocardiogram examinations, most                 recent 03/22/2018. Cardiomyopathy and CHF, Signs/Symptoms:Chest                 Pain; Risk Factors:Hypertension and Non-Smoker.  Sonographer:    Paulita Fujita RDCS Referring Phys: [id number] [place]  1. Left ventricular ejection fraction, by visual estimation, is 30 to 35%. The left ventricle has severely decreased function. There is borderline left ventricular hypertrophy.  2. Elevated left ventricular end-diastolic pressure.  3. Left ventricular diastolic parameters are consistent with Grade I diastolic dysfunction (impaired relaxation).  4. Mildly dilated left ventricular internal cavity size.  5. The left ventricle demonstrates global hypokinesis.  6. Global right ventricle has normal systolic function.The right ventricular size is mildly enlarged. No increase in right ventricular wall thickness.  7. Left atrial size was normal.  8. Right atrial size was normal.  9. Mild mitral annular calcification. 10. The mitral valve is degenerative. Moderate mitral valve regurgitation. 11. The tricuspid valve is grossly normal. 12. The aortic valve is tricuspid. Aortic valve regurgitation is not visualized. No evidence of aortic valve sclerosis or stenosis. 13. The pulmonic valve was grossly normal. Pulmonic valve regurgitation is not visualized. 14. Normal pulmonary artery systolic pressure. 15. A pacer wire is visualized. 16. The inferior vena cava is normal in size with greater than 50% respiratory variability, suggesting right atrial pressure of 3 mmHg. FINDINGS  Left Ventricle: Left ventricular ejection fraction, by visual estimation, is 30 to 35%. The left ventricle has severely decreased function. The left ventricle demonstrates global hypokinesis. The left ventricular internal cavity size was mildly dilated left ventricle. There is borderline left ventricular hypertrophy. Left ventricular diastolic parameters are consistent with Grade I diastolic dysfunction (impaired relaxation). Elevated left ventricular end-diastolic pressure. Right Ventricle: The right ventricular size is mildly enlarged. No increase in right ventricular wall thickness. Global RV systolic function is has normal systolic  function. The tricuspid regurgitant velocity is 2.19 m/s, and with an assumed right atrial  pressure of 3 mmHg, the estimated right ventricular systolic pressure is normal at 22.2 mmHg. Left Atrium: Left atrial size was normal in size.  Right Atrium: Right atrial size was normal in size Pericardium: There is no evidence of pericardial effusion. Mitral Valve: The mitral valve is degenerative in appearance. There is mild thickening of the mitral valve leaflet(s). Mild mitral annular calcification. Moderate mitral valve regurgitation. Tricuspid Valve: The tricuspid valve is grossly normal. Tricuspid valve regurgitation is mild. Aortic Valve: The aortic valve is tricuspid. Aortic valve regurgitation is not visualized. The aortic valve is structurally normal, with no evidence of sclerosis or stenosis. Pulmonic Valve: The pulmonic valve was grossly normal. Pulmonic valve regurgitation is not visualized. Pulmonic regurgitation is not visualized. Aorta: The aortic root is normal in size and structure. Venous: The inferior vena cava is normal in size with greater than 50% respiratory variability, suggesting right atrial pressure of 3 mmHg. IAS/Shunts: No atrial level shunt detected by color flow Doppler. Additional Comments: A pacer wire is visualized.  LEFT VENTRICLE PLAX 2D LVIDd:         5.90 cm       Diastology LVIDs:         4.90 cm       LV e' lateral:   5.27 cm/s LV PW:         1.00 cm       LV E/e' lateral: 12.8 LV IVS:        1.00 cm       LV e' medial:    4.05 cm/s LVOT diam:     1.80 cm       LV E/e' medial:  16.7 LV SV:         60 ml LV SV Index:   31.19 LVOT Area:     2.54 cm  LV Volumes (MOD) LV area d, A2C:    49.30 cm LV area d, A4C:    43.60 cm LV area s, A2C:    40.40 cm LV area s, A4C:    33.50 cm LV major d, A2C:   9.26 cm LV major d, A4C:   8.46 cm LV major s, A2C:   8.51 cm LV major s, A4C:   7.73 cm LV vol d, MOD A2C: 215.0 ml LV vol d, MOD A4C: 183.0 ml LV vol s, MOD A2C: 156.0 ml LV vol s, MOD  A4C: 119.0 ml LV SV MOD A2C:     59.0 ml LV SV MOD A4C:     183.0 ml LV SV MOD BP:      64.4 ml RIGHT VENTRICLE RV S prime:     15.20 cm/s TAPSE (M-mode): 2.1 cm LEFT ATRIUM             Index       RIGHT ATRIUM           Index LA diam:        4.20 cm 2.24 cm/m  RA Area:     17.80 cm LA Vol (A2C):   49.4 ml 26.37 ml/m RA Volume:   50.90 ml  27.17 ml/m LA Vol (A4C):   58.6 ml 31.28 ml/m LA Biplane Vol: 59.4 ml 31.71 ml/m  AORTIC VALVE LVOT Vmax:   106.00 cm/s LVOT Vmean:  74.000 cm/s LVOT VTI:    0.220 m  AORTA Ao Root diam: 2.50 cm MITRAL VALVE                        TRICUSPID VALVE MV Area (PHT): 3.85 cm             TR Peak  grad:   19.2 mmHg MV PHT:        57.13 msec           TR Vmax:        219.00 cm/s MV Decel Time: 197 msec MR Peak grad: 95.3 mmHg             SHUNTS MR Mean grad: 59.0 mmHg             Systemic VTI:  0.22 m MR Vmax:      488.00 cm/s           Systemic Diam: 1.80 cm MR Vmean:     364.0 cm/s MV E velocity: 67.70 cm/s 103 cm/s MV A velocity: 89.10 cm/s 70.3 cm/s MV E/A ratio:  0.76       1.5  Kate Sable MD Electronically signed by Kate Sable MD Signature Date/Time: 12/30/2018/4:26:45 PM    Final     Cardiac Studies   Echo EF remains 30-35% with no pericardial effusion  Patient Profile     53 y.o. male with NICMdiagnosed around 2006(felt 2/2 HTN/ETOH),chronic systolic CHF,[employer] [facility] in [place] 2015,?patient-reportedPCI to unknown vessel 2011 at outside location (cath 2014 without CAD and normal coronaries by CT 07/2016), ventricular tachycardia, noncompliancewith financial challenges, chronic chest pain of noncardiac source,depression w/ETOH abuse, HTN, gout. He had had SOB for 2 weeks, but 2 days prior to admission felt significantly worse with DOE, cough, vomiting. He did not take his medicines on 12/21-12/22 due to feeling poorly. He presented to the ED with hypoxia, tachycardia, marked HTN, lactic acidosis, leukocytosis and fever of 101.25F. Rapid and PCR Covid  were negative on arrival. CTA shows no PE, but did show extensive ground-glass opacities throughout both lungs consistent with multifocal pneumonia.  Covid test was negative  Assessment & Plan    Chronic systolic heart failure -Appears euvolemic.  Holding Lasix.  Holding losartan and Aldactone given acute kidney injury. -Continuing with carvedilol 25 twice daily and BiDil 20-30 7.5 3 times daily.  Digoxin is also present, continue to watch renal function. -Renal function/creatinine has normalized.  I will go ahead and resume his spironolactone 12.5 mg once a day and every other day Lasix as he is taking at home 80 mg.  LBBB -Previously appeared more narrow than current, consider CRT therapy as outpatient.  Chest pain -Chronic type chest discomfort, seems to be worse with pneumonia.  No evidence of ACS from troponin aspect.  Prior ischemic testing was normal.  Clearly pleuritic component.  Echocardiogram overall reassuring with no pericardial effusion  Prior ventricular tachycardia -ICD in place.  I would go ahead and continue with his home amiodarone 200 mg daily to prevent future shocks.  He has reported some noncompliance with this medication.  Accelerated hypertension -Blood pressure labile.  Acute hypoxic respiratory failure -Multifocal pneumonia, treated with doxycycline and ceftriaxone.  Once again patient appears euvolemic yesterday on exam.  Holding Lasix.  Creatinine back to 1.03  Prolonged QT -No significant change from prior EKG.  Try to avoid QT prolonging agents.  From a cardiology aspect, seems reasonable for discharge.  Discussed with Dr. Tana Coast.  Continue follow-up with Dr. [name]  For questions or updates, please contact [place] Please consult [email or web address] for contact info under        Signed, Candee Furbish, MD  12/31/2018, 8:45 AM

## 2019-01-01 LAB — LEGIONELLA PNEUMOPHILA SEROGP 1 UR AG: L. pneumophila Serogp 1 Ur Ag: NEGATIVE

## 2019-01-02 ENCOUNTER — Telehealth: Payer: Self-pay | Admitting: Nurse Practitioner

## 2019-01-02 ENCOUNTER — Telehealth: Payer: Self-pay

## 2019-01-02 LAB — CULTURE, BLOOD (ROUTINE X 2)
Culture: NO GROWTH
Culture: NO GROWTH
Special Requests: ADEQUATE
Special Requests: ADEQUATE

## 2019-01-02 NOTE — Telephone Encounter (Signed)
Patient returned CM call. Please f/u.

## 2019-01-02 NOTE — Telephone Encounter (Signed)
Transition Care Management Follow-up Telephone Call Date of discharge and from where: 12/31/2018. [facility]  Call placed to patient # [phone], message left with call back requested to this CM.

## 2019-01-02 NOTE — Telephone Encounter (Signed)
Transition Care Management Follow-up Telephone Call Date of discharge and from where: 12/31/2018. [facility]   Call returned to the patient # [phone], message left with call back requested to this CM

## 2019-01-03 ENCOUNTER — Telehealth: Payer: Self-pay

## 2019-01-03 NOTE — Telephone Encounter (Signed)
Transition Care Management Follow-up Telephone Call  Date of discharge and from where: 12/31/2018, [facility]   How have you been since you were released from the hospital? He stated  he still feels weak but is breathing better.  Experiences shortness of breath with exertion. He was not short of breath while talking with this CM.   Any questions or concerns? He thinks he may need a "breathing machine." and said that  he has not had a nebulizer in the past. Explained to him that the provider will assess the need for this at his appt next week.      Did the pt receive and understand the discharge instructions provided? He has the instructions and does not have any questions.   Medications obtained and verified? Medication list reviewed in detail. He had no questions. He said that he has not needed to take the extra furosemide for swelling.   Stated that he needs to call his pharmacy for refills of buspar and losartan. He also needs to pick up the doxycycline at his pharmacy.   Any new allergies since your discharge? None reported   Do you have support at home? Lives alone but stated that his children come to check on him.  Other (ie: DME, Home Health, etc) no home health ordered.   Has a scale. Stated that he weighs himself daily. Encouraged him to keep a log of his weights. Has not weighed himself today.   Thinks he could benefit from a cane but does not have one.  Functional Questionnaire: (I = Independent and D = Dependent) ADL's: independent   Follow up appointments reviewed:    PCP Hospital f/u appt confirmed? Scheduled appt with Dr Joya Gaskins - 01/10/2019 @ 1500.   [facility] f/u appt confirmed?he needs to call to schedule an appt with cardiology  Are transportation arrangements needed? No, he said that someone can drive him.  Gave him the number for medicaid to register for transportation to medical appts  If their condition worsens, is the pt aware to call   their PCP or go to the ED?yes  Was the patient provided with contact information for the PCP's office or ED? He has the phone number for the clinic  Was the pt encouraged to call back with questions or concerns?yes.  Also encouraged him to call back for LCSW support if he needs assistance managing his anxiety.

## 2019-01-10 ENCOUNTER — Encounter: Payer: Self-pay | Admitting: Critical Care Medicine

## 2019-01-10 ENCOUNTER — Other Ambulatory Visit: Payer: Self-pay

## 2019-01-10 ENCOUNTER — Ambulatory Visit: Payer: Medicaid Other | Attending: Critical Care Medicine | Admitting: Critical Care Medicine

## 2019-01-10 VITALS — BP 137/86 | HR 79 | Resp 16 | Wt 174.2 lb

## 2019-01-10 DIAGNOSIS — I428 Other cardiomyopathies: Secondary | ICD-10-CM | POA: Diagnosis not present

## 2019-01-10 DIAGNOSIS — Z23 Encounter for immunization: Secondary | ICD-10-CM

## 2019-01-10 DIAGNOSIS — F331 Major depressive disorder, recurrent, moderate: Secondary | ICD-10-CM | POA: Diagnosis not present

## 2019-01-10 DIAGNOSIS — Z818 Family history of other mental and behavioral disorders: Secondary | ICD-10-CM | POA: Diagnosis not present

## 2019-01-10 DIAGNOSIS — Z95810 Presence of automatic (implantable) cardiac defibrillator: Secondary | ICD-10-CM | POA: Insufficient documentation

## 2019-01-10 DIAGNOSIS — Z79899 Other long term (current) drug therapy: Secondary | ICD-10-CM | POA: Diagnosis not present

## 2019-01-10 DIAGNOSIS — Z91018 Allergy to other foods: Secondary | ICD-10-CM | POA: Diagnosis not present

## 2019-01-10 DIAGNOSIS — I5042 Chronic combined systolic (congestive) and diastolic (congestive) heart failure: Secondary | ICD-10-CM | POA: Insufficient documentation

## 2019-01-10 DIAGNOSIS — R748 Abnormal levels of other serum enzymes: Secondary | ICD-10-CM

## 2019-01-10 DIAGNOSIS — Z888 Allergy status to other drugs, medicaments and biological substances status: Secondary | ICD-10-CM | POA: Diagnosis not present

## 2019-01-10 DIAGNOSIS — I2510 Atherosclerotic heart disease of native coronary artery without angina pectoris: Secondary | ICD-10-CM | POA: Diagnosis not present

## 2019-01-10 DIAGNOSIS — I110 Hypertensive heart disease with heart failure: Secondary | ICD-10-CM | POA: Insufficient documentation

## 2019-01-10 DIAGNOSIS — F411 Generalized anxiety disorder: Secondary | ICD-10-CM | POA: Diagnosis not present

## 2019-01-10 DIAGNOSIS — Z800 Family history of malignant neoplasm of digestive organs: Secondary | ICD-10-CM | POA: Diagnosis not present

## 2019-01-10 DIAGNOSIS — E782 Mixed hyperlipidemia: Secondary | ICD-10-CM

## 2019-01-10 DIAGNOSIS — I519 Heart disease, unspecified: Secondary | ICD-10-CM

## 2019-01-10 DIAGNOSIS — Z8249 Family history of ischemic heart disease and other diseases of the circulatory system: Secondary | ICD-10-CM | POA: Insufficient documentation

## 2019-01-10 DIAGNOSIS — J189 Pneumonia, unspecified organism: Secondary | ICD-10-CM

## 2019-01-10 DIAGNOSIS — F1010 Alcohol abuse, uncomplicated: Secondary | ICD-10-CM

## 2019-01-10 DIAGNOSIS — R7401 Elevation of levels of liver transaminase levels: Secondary | ICD-10-CM | POA: Insufficient documentation

## 2019-01-10 DIAGNOSIS — G4733 Obstructive sleep apnea (adult) (pediatric): Secondary | ICD-10-CM

## 2019-01-10 DIAGNOSIS — Z91013 Allergy to seafood: Secondary | ICD-10-CM | POA: Insufficient documentation

## 2019-01-10 DIAGNOSIS — I472 Ventricular tachycardia, unspecified: Secondary | ICD-10-CM

## 2019-01-10 DIAGNOSIS — J9601 Acute respiratory failure with hypoxia: Secondary | ICD-10-CM | POA: Diagnosis not present

## 2019-01-10 DIAGNOSIS — F329 Major depressive disorder, single episode, unspecified: Secondary | ICD-10-CM

## 2019-01-10 DIAGNOSIS — F419 Anxiety disorder, unspecified: Secondary | ICD-10-CM

## 2019-01-10 DIAGNOSIS — J181 Lobar pneumonia, unspecified organism: Secondary | ICD-10-CM | POA: Diagnosis not present

## 2019-01-10 DIAGNOSIS — N179 Acute kidney failure, unspecified: Secondary | ICD-10-CM

## 2019-01-10 DIAGNOSIS — I10 Essential (primary) hypertension: Secondary | ICD-10-CM

## 2019-01-10 DIAGNOSIS — I5022 Chronic systolic (congestive) heart failure: Secondary | ICD-10-CM

## 2019-01-10 DIAGNOSIS — F32A Depression, unspecified: Secondary | ICD-10-CM

## 2019-01-10 DIAGNOSIS — Z1159 Encounter for screening for other viral diseases: Secondary | ICD-10-CM

## 2019-01-10 MED ORDER — SERTRALINE HCL 50 MG PO TABS: 50.0000 mg | ORAL_TABLET | Freq: Every day | ORAL | 3 refills | Status: DC

## 2019-01-10 MED ORDER — ATORVASTATIN CALCIUM 40 MG PO TABS: 40.0000 mg | ORAL_TABLET | Freq: Every day | ORAL | 3 refills | Status: DC

## 2019-01-10 MED ORDER — POTASSIUM CHLORIDE CRYS ER 20 MEQ PO TBCR: 20.0000 meq | EXTENDED_RELEASE_TABLET | Freq: Every day | ORAL | 3 refills | Status: DC

## 2019-01-10 MED ORDER — BIDIL 20-37.5 MG PO TABS: 1.0000 | ORAL_TABLET | Freq: Three times a day (TID) | ORAL | 3 refills | Status: DC

## 2019-01-10 MED ORDER — SPIRONOLACTONE 25 MG PO TABS: 12.5000 mg | ORAL_TABLET | Freq: Every day | ORAL | 2 refills | Status: DC

## 2019-01-10 MED ORDER — BUSPIRONE HCL 10 MG PO TABS: 10.0000 mg | ORAL_TABLET | Freq: Every day | ORAL | 1 refills | Status: DC

## 2019-01-10 MED ORDER — DIGOXIN 125 MCG PO TABS: 0.1250 mg | ORAL_TABLET | Freq: Every day | ORAL | 3 refills | Status: DC

## 2019-01-10 MED ORDER — CARVEDILOL 25 MG PO TABS: 25.0000 mg | ORAL_TABLET | Freq: Two times a day (BID) | ORAL | 3 refills | Status: DC

## 2019-01-10 MED ORDER — FUROSEMIDE 80 MG PO TABS: 80.0000 mg | ORAL_TABLET | ORAL | 2 refills | Status: DC
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
## 2019-01-10 NOTE — Patient Instructions (Addendum)
All your medications were refilled and I will asked Dr. [name] about the amiodarone dosing.  Begin sertraline 50 mg every night to help with sleep and also anxiety  All medicines were sent to your [place] today include metabolic panel, hepatitis studies, complete blood count and lipid panel  Continue to focus on alcohol cessation and we will connect you with our licensed clinical social worker Christa See for counseling  A CPAP machine will be obtained for you  Dr. Joya Gaskins will see you back in follow-up in 4 weeks  A tetanus vaccine was given   Td (Tetanus, Diphtheria) Vaccine: What You Need to Know 1. Why get vaccinated? Td vaccine can prevent tetanus and diphtheria. Tetanus enters the body through cuts or wounds. Diphtheria spreads from person to person.  TETANUS (T) causes painful stiffening of the muscles. Tetanus can lead to serious health problems, including being unable to open the mouth, having trouble swallowing and breathing, or death.  DIPHTHERIA (D) can lead to difficulty breathing, heart failure, paralysis, or death. 2. Td vaccine Td is only for children 7 years and older, adolescents, and adults.  Td is usually given as a booster dose every 10 years, but it can also be given earlier after a severe and dirty wound or burn. Another vaccine, called Tdap, that protects against pertussis, also known as "whooping cough," in addition to tetanus and diphtheria, may be used instead of Td.  Td may be given at the same time as other vaccines. 3. Talk with your health care provider Tell your vaccine provider if the person getting the vaccine:  Has had an allergic reaction after a previous dose of any vaccine that protects against tetanus or diphtheria, or has any severe, life-threatening allergies.  Has ever had Guillain-Barr Syndrome (also called GBS).  Has had severe pain or swelling after a previous dose of any vaccine that protects against tetanus or  diphtheria. In some cases, your health care provider may decide to postpone Td vaccination to a future visit.  People with minor illnesses, such as a cold, may be vaccinated. People who are moderately or severely ill should usually wait until they recover before getting Td vaccine.  Your health care provider can give you more information. 4. Risks of a vaccine reaction  Pain, redness, or swelling where the shot was given, mild fever, headache, feeling tired, and nausea, vomiting, diarrhea, or stomachache sometimes happen after Td vaccine. People sometimes faint after medical procedures, including vaccination. Tell your provider if you feel dizzy or have vision changes or ringing in the ears.  As with any medicine, there is a very remote chance of a vaccine causing a severe allergic reaction, other serious injury, or death. 5. What if there is a serious problem? An allergic reaction could occur after the vaccinated person leaves the clinic. If you see signs of a severe allergic reaction (hives, swelling of the face and throat, difficulty breathing, a fast heartbeat, dizziness, or weakness), call 9-1-1 and get the person to the nearest hospital.  For other signs that concern you, call your health care provider.  Adverse reactions should be reported to the Vaccine Adverse Event Reporting System (VAERS). Your health care provider will usually file this report, or you can do it yourself. Visit the VAERS website at www.vaers.SamedayNews.es or call 507 280 5321. VAERS is only for reporting reactions, and VAERS staff do not give medical advice. 6. The National Vaccine Injury Compensation Program The [employer] Vaccine Injury Compensation Program ([place]) is  a federal program that was created to compensate people who may have been injured by certain vaccines. Visit the VICP website at GoldCloset.com.ee or call 520-451-9914 to learn about the program and about filing a claim. There is a time limit to  file a claim for compensation. 7. How can I learn more?  Ask your health care provider.  Call your local or state health department.  Contact the Centers for Disease Control and Prevention (CDC): ? Call 3171644804 (1-800-CDC-INFO) or ? Visit CDC's website at http://hunter.com/ Vaccine Information Statement Td Vaccine (04/06/18) This information is not intended to replace advice given to you by your health care provider. Make sure you discuss any questions you have with your health care provider. Document Revised: 05/16/2018 Document Reviewed: 04/18/2018 Elsevier Patient Education  [place].

## 2019-01-11 ENCOUNTER — Encounter: Payer: Self-pay | Admitting: Critical Care Medicine

## 2019-01-11 ENCOUNTER — Telehealth: Payer: Self-pay

## 2019-01-11 DIAGNOSIS — F331 Major depressive disorder, recurrent, moderate: Secondary | ICD-10-CM | POA: Insufficient documentation

## 2019-01-11 LAB — COMPREHENSIVE METABOLIC PANEL
ALT: 24 IU/L (ref 0–44)
AST: 28 IU/L (ref 0–40)
Albumin/Globulin Ratio: 1.5 (ref 1.2–2.2)
Albumin: 4.7 g/dL (ref 3.8–4.9)
Alkaline Phosphatase: 81 IU/L (ref 39–117)
BUN/Creatinine Ratio: 23 — ABNORMAL HIGH (ref 9–20)
BUN: 26 mg/dL — ABNORMAL HIGH (ref 6–24)
Bilirubin Total: 0.7 mg/dL (ref 0.0–1.2)
CO2: 25 mmol/L (ref 20–29)
Calcium: 10.2 mg/dL (ref 8.7–10.2)
Chloride: 95 mmol/L — ABNORMAL LOW (ref 96–106)
Creatinine, Ser: 1.14 mg/dL (ref 0.76–1.27)
GFR calc Af Amer: 83 mL/min/{1.73_m2} (ref >59)
GFR calc non Af Amer: 72 mL/min/{1.73_m2} (ref >59)
Globulin, Total: 3.1 g/dL (ref 1.5–4.5)
Glucose: 68 mg/dL (ref 65–99)
Potassium: 4.6 mmol/L (ref 3.5–5.2)
Sodium: 138 mmol/L (ref 134–144)
Total Protein: 7.8 g/dL (ref 6.0–8.5)

## 2019-01-11 LAB — CBC WITH DIFFERENTIAL/PLATELET
Basophils Absolute: 0.1 10*3/uL (ref 0.0–0.2)
Basos: 1 %
EOS (ABSOLUTE): 0.1 10*3/uL (ref 0.0–0.4)
Eos: 1 %
Hematocrit: 46.2 % (ref 37.5–51.0)
Hemoglobin: 15.7 g/dL (ref 13.0–17.7)
Immature Grans (Abs): 0.1 10*3/uL (ref 0.0–0.1)
Immature Granulocytes: 1 %
Lymphocytes Absolute: 3.2 10*3/uL — ABNORMAL HIGH (ref 0.7–3.1)
Lymphs: 25 %
MCH: 34.7 pg — ABNORMAL HIGH (ref 26.6–33.0)
MCHC: 34 g/dL (ref 31.5–35.7)
MCV: 102 fL — ABNORMAL HIGH (ref 79–97)
Monocytes Absolute: 0.9 10*3/uL (ref 0.1–0.9)
Monocytes: 7 %
Neutrophils Absolute: 8.6 10*3/uL — ABNORMAL HIGH (ref 1.4–7.0)
Neutrophils: 65 %
Platelets: 353 10*3/uL (ref 150–450)
RBC: 4.53 x10E6/uL (ref 4.14–5.80)
RDW: 12.4 % (ref 11.6–15.4)
WBC: 13 10*3/uL — ABNORMAL HIGH (ref 3.4–10.8)

## 2019-01-11 LAB — HEPATITIS C ANTIBODY: Hep C Virus Ab: <0.1 s/co ratio (ref 0.0–0.9)

## 2019-01-11 NOTE — Telephone Encounter (Signed)
Order for CPAP faxed to [place]

## 2019-01-11 NOTE — Assessment & Plan Note (Signed)
Given anxiety and depression and high score on PHQ-9 and GAD-7 and alcohol use I will connect the patient with our licensed clinical social worker

## 2019-01-11 NOTE — Assessment & Plan Note (Signed)
Chronic systolic dysfunction left ventricle with chronic systolic heart failure and some component of diastolic heart failure  Ejection fraction is low and the patient appears to be on appropriate therapy for his heart failure.  The only question I had was he is not on amiodarone at this time and the patient states he has not been on this for several months.  I will question cardiology on this and I have asked the patient not to take the medication for now  We will need to assess the patient's metabolic panel including kidney and liver function  Refills on the patient's Coreg Lipitor digoxin furosemide potassium and Aldactone  and BiDil weregiven

## 2019-01-11 NOTE — Assessment & Plan Note (Signed)
Elevated liver functions likely on the basis of heart failure and alcohol use  The patient states he is not been drinking alcohol in 3 weeks and his liver functions were trending down during the short hospital stay  Repeat liver functions today

## 2019-01-11 NOTE — Assessment & Plan Note (Signed)
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

## 2019-01-11 NOTE — Assessment & Plan Note (Signed)
We will reassess renal function with a metabolic panel at this visit

## 2019-01-11 NOTE — Assessment & Plan Note (Signed)
Blood pressure today is at an adequate level on current program

## 2019-01-11 NOTE — Assessment & Plan Note (Signed)
Acute hypoxemia has resolved

## 2019-01-11 NOTE — Assessment & Plan Note (Signed)
Chest is completely clear and multifocal pneumonia appears to have resolved

## 2019-01-11 NOTE — Assessment & Plan Note (Signed)
Significant depression with use of alcohol  Plan will be to refer to licensed clinical social worker and been given sertraline 50 mg daily  This antidepressant seems to be the cleanest medication for this patient's prolonged QTc interval

## 2019-01-11 NOTE — Assessment & Plan Note (Signed)
Significant obstructive sleep apnea with an AHI greater than 20 and significant desaturation at night  In addition to the heart failure I believe the significant orthopnea and paroxysmal nocturnal dyspnea is on the basis of sleep apnea not treated  The patient does have Medicaid at this time and we will prescribe CPAP with a range of 5 to 11 cm water pressure and will have a mask fitting and [facility]

## 2019-01-13 ENCOUNTER — Telehealth (INDEPENDENT_AMBULATORY_CARE_PROVIDER_SITE_OTHER): Payer: Self-pay

## 2019-01-13 NOTE — Telephone Encounter (Signed)
Left message notifying patient that his kidney function, liver, potassium and blood counts are good; no longer anemic. No medication changes. Call [facility] at [phone] with any questions or concerns. Nat Christen, CMA

## 2019-01-13 NOTE — Telephone Encounter (Signed)
-----   Message from Elsie Stain, MD sent at 01/11/2019 12:29 PM EST ----- Let mr Seidel know his liver, kidney function and potassium is good, no medication changes,  his blood counts ok, he is no longer anemic.   No medication changes

## 2019-01-25 ENCOUNTER — Telehealth: Payer: Self-pay

## 2019-01-25 NOTE — Telephone Encounter (Signed)
Call placed to [facility] to check on the status of the CPAP order. Spoke to [employer] who stated that she didn't receive it despite confirmed receipt.  She said that she will pull the order and information needed from Epic and contact this CM if additional information is needed,

## 2019-01-26 NOTE — Progress Notes (Signed)
Electrophysiology Office Note Date: 01/27/2019  ID:  Stanley [name], DOB 12/09/1965, MRN [id number]  PCP: Gildardo Pounds, NP Primary Cardiologist: Harrington Challenger Heart Failure: [place] Electrophysiologist: Allred  CC: Routine ICD follow-up  Stanley [name] is a 54 y.o. male seen today for Dr Rayann Heman.  He is a 54 y.o. male with NICMdiagnosed around 2006(felt 2/2 HTN/ETOH),chronic systolic CHF,[employer] in [place] 2015,?patient-reportedPCI to unknown vessel 2011 at outside location (cath 2014 without CAD and normal coronaries by CT 07/2016), ventricular tachycardia, noncompliancewith financial challenges, chronic chest pain of noncardiac source,depression w/ETOH abuse, HTN, gout.  He was admitted in December of 2020 for SOB for 2 weeks, but 2 days prior to admission felt significantly worse with DOE, cough, vomiting. He did not take his medicines on 12/21-12/22 due to feeling poorly. He presented to the ED with hypoxia, tachycardia, marked HTN, lactic acidosis, leukocytosis and fever of 101.69F. Rapid and PCR Covid were negative on arrival. CTA showed no PE, but did show extensive ground-glass opacities throughout both lungs consistent with multifocal pneumonia.  Covid test was negative.  Remote interrogation demonstrated heart rates 130's with episodes in VT monitor zone without therapy.  He presents today for routine electrophysiology followup.  Since discharge, the patient reports doing reasonably well. He has had some abdominal pain since discharge. He denies palpitations, dyspnea, PND, orthopnea, nausea, vomiting, dizziness, syncope, edema, weight gain, or early satiety.  He has not had ICD shocks.   Device History: BSX ICD implanted 2015 for primary prevention History of appropriate therapy: Yes History of AAD therapy: Yes   Past Medical History:  Diagnosis Date  . Acute renal failure (ARF) ([place])   . Acute respiratory failure with hypoxia ([place]) 12/29/2018  . Alcohol abuse   . CAD  (coronary artery disease)    a. dx unclear, reported PCI in 2011 but cath 2014 normal coronaries and cardiac CT 07/2016 normal coronaries, no mention of stents.  . Chronic chest pain   . Chronic systolic CHF (congestive heart failure) ([place])   . Depression   . Financial difficulties   . Gout   . Gouty arthritis   . History of noncompliance with medical treatment    financial challenges  . Hypertension   . ICD (implantable cardioverter-defibrillator) in place    [place] [employer] implant done in [place] 2015  . Insomnia   . Lobar pneumonia ([place]) 12/28/2018  . Nonischemic cardiomyopathy ([place])   . Ventricular tachycardia ([place]) 01/2015   2017 - ATP delivered for sinus tach, degenerated into VT for which he received shock. Recurrent VT 03/2018.   Past Surgical History:  Procedure Laterality Date  . CARDIAC DEFIBRILLATOR PLACEMENT  06/05/13   [employer] Inogen ICD implanted at [facility] in [place] for primary prevention  . HERNIA REPAIR    . PERCUTANEOUS CORONARY STENT INTERVENTION (PCI-S)  2011    Current Outpatient Medications  Medication Sig Dispense Refill  . amiodarone (PACERONE) 200 MG tablet Take 1 tablet (200 mg total) by mouth daily. 90 tablet 3  . atorvastatin (LIPITOR) 40 MG tablet Take 1 tablet (40 mg total) by mouth daily. 90 tablet 3  . busPIRone (BUSPAR) 10 MG tablet Take 1 tablet (10 mg total) by mouth daily. 60 tablet 1  . carvedilol (COREG) 25 MG tablet Take 1 tablet (25 mg total) by mouth 2 (two) times daily with a meal. 180 tablet 3  . digoxin (LANOXIN) 0.125 MG tablet Take 1 tablet (0.125 mg total) by mouth daily. 90 tablet 3  .  furosemide (LASIX) 80 MG tablet Take 1 tablet (80 mg total) by mouth See admin instructions. Take 1 tablet (80 mg total) by mouth every Monday, Wednesday, and Friday. May also take 1 tablet (80 mg total) as needed for swelling. 60 tablet 2  . isosorbide-hydrALAZINE (BIDIL) 20-37.5 MG tablet Take 1 tablet by mouth 3 (three) times daily.  90 tablet 3  . losartan (COZAAR) 50 MG tablet Take 1 tablet (50 mg total) by mouth daily. 90 tablet 3  . nitroGLYCERIN (NITROSTAT) 0.4 MG SL tablet Place 1 tablet (0.4 mg total) under the tongue every 5 (five) minutes x 3 doses as needed for chest pain. 30 tablet 6  . potassium chloride SA (KLOR-CON) 20 MEQ tablet Take 1 tablet (20 mEq total) by mouth daily. 90 tablet 3  . sertraline (ZOLOFT) 50 MG tablet Take 1 tablet (50 mg total) by mouth daily. 30 tablet 3  . spironolactone (ALDACTONE) 25 MG tablet Take 0.5 tablets (12.5 mg total) by mouth daily. 30 tablet 2  . tamsulosin (FLOMAX) 0.4 MG CAPS capsule Take 1 capsule (0.4 mg total) by mouth daily. 30 capsule 3   No current facility-administered medications for this visit.    Allergies:   Lisinopril, Apple, Other, and Shellfish allergy   Social History: Social History   Socioeconomic History  . Marital status: Divorced    Spouse name: Not on file  . Number of children: 1  . Years of education: 46  . Highest education level: Not on file  Occupational History  . Occupation: Disability  Tobacco Use  . Smoking status: Never Smoker  . Smokeless tobacco: Never Used  Substance and Sexual Activity  . Alcohol use: Yes    Alcohol/week: 0.0 standard drinks  . Drug use: No  . Sexual activity: Not Currently  Other Topics Concern  . Not on file  Social History Narrative   Lives in [place] alone.  Disabled.  Previously worked as a Careers adviser.   Fun: Rest, Read and watch movies.    Social Determinants of Health   Financial Resource Strain: Medium Risk  . Difficulty of Paying Living Expenses: Somewhat hard  Food Insecurity: Food Insecurity Present  . Worried About Charity fundraiser in the Last Year: Sometimes true  . Ran Out of Food in the Last Year: Sometimes true  Transportation Needs: No Transportation Needs  . Lack of Transportation (Medical): No  . Lack of Transportation (Non-Medical): No  Physical Activity: Unknown   . Days of Exercise per Week: Patient refused  . Minutes of Exercise per Session: Patient refused  Stress: Stress Concern Present  . Feeling of Stress : To some extent  Social Connections: Unknown  . Frequency of Communication with Friends and Family: Patient refused  . Frequency of Social Gatherings with Friends and Family: Patient refused  . Attends Religious Services: Patient refused  . Active Member of Clubs or Organizations: Patient refused  . Attends Archivist Meetings: Patient refused  . Marital Status: Patient refused  Intimate Partner Violence: Not At Risk  . Fear of Current or Ex-Partner: No  . Emotionally Abused: No  . Physically Abused: No  . Sexually Abused: No    Family History: Family History  Problem Relation Age of Onset  . Cancer Mother        Pancreatic  . Hypertension Father   . Alzheimer's disease Father   . Gout Father   . Dementia Father   . Hypertension Sister   . Clotting disorder  Sister   . Obesity Brother   . Bronchitis Brother   . Heart disease Maternal Grandmother   . Gout Paternal Grandfather     Review of Systems: All other systems reviewed and are otherwise negative except as noted above.   Physical Exam: VS:  BP 136/82   Pulse 83   Ht 5\' 5"  (1.651 m)   Wt 177 lb (80.3 kg)   SpO2 98%   BMI 29.45 kg/m  , BMI Body mass index is 29.45 kg/m.  GEN- The patient is well appearing, alert and oriented x 3 today.   HEENT: normocephalic, atraumatic; sclera clear, conjunctiva pink; hearing intact; oropharynx clear; neck supple  Lungs- Clear to ausculation bilaterally, normal work of breathing.  No wheezes, rales, rhonchi Heart- Regular rate and rhythm  GI- soft, non-tender, non-distended, bowel sounds present  Extremities- no clubbing, cyanosis, or edema  MS- no significant deformity or atrophy Skin- warm and dry, no rash or lesion; ICD pocket well healed Psych- euthymic mood, full affect Neuro- strength and sensation are  intact  ICD interrogation- reviewed in detail today,  See PACEART report  EKG:  EKG is not ordered today.  Recent Labs: 03/21/2018: TSH 0.485 12/28/2018: B Natriuretic Peptide 464.9 12/31/2018: Magnesium 2.1 01/10/2019: ALT 24; BUN 26; Creatinine, Ser 1.14; Hemoglobin 15.7; Platelets 353; Potassium 4.6; Sodium 138   Wt Readings from Last 3 Encounters:  01/27/19 177 lb (80.3 kg)  01/10/19 174 lb 3.2 oz (79 kg)  12/30/18 175 lb 14.8 oz (79.8 kg)     Other studies Reviewed: Additional studies/ records that were reviewed today include: HF and Dr Bonita Quin' notes  Assessment and Plan:  1.  Chronic systolic dysfunction Class III Followed in AHF clinic Normal ICD function See Pace Art report No changes today Will ask research to screen for Barostim study BNP, BMET today   2.  Ventricular tachycardia No recent recurrence Continue amiodarone TSH today   3.  HTN Stable No change required today    Current medicines are reviewed at length with the patient today.   The patient does not have concerns regarding his medicines.  The following changes were made today:  none  Labs/ tests ordered today include: BNP, BMET, TSH Orders Placed This Encounter  Procedures  . Basic Metabolic Panel (BMET)  . Pro b natriuretic peptide (BNP)  . TSH     Disposition:   Follow up with remotes, AHF as scheduled, me in 6 months     Signed, Chanetta Marshall, NP 01/27/2019 11:39 AM  [facility] [place] [address] [phone] (office) [phone] (fax)

## 2019-01-27 ENCOUNTER — Encounter: Payer: Self-pay | Admitting: Nurse Practitioner

## 2019-01-27 ENCOUNTER — Other Ambulatory Visit: Payer: Self-pay

## 2019-01-27 ENCOUNTER — Ambulatory Visit (INDEPENDENT_AMBULATORY_CARE_PROVIDER_SITE_OTHER): Payer: Medicaid Other | Admitting: Nurse Practitioner

## 2019-01-27 VITALS — BP 136/82 | HR 83 | Ht 65.0 in | Wt 177.0 lb

## 2019-01-27 DIAGNOSIS — I472 Ventricular tachycardia, unspecified: Secondary | ICD-10-CM

## 2019-01-27 DIAGNOSIS — I519 Heart disease, unspecified: Secondary | ICD-10-CM | POA: Diagnosis not present

## 2019-01-27 DIAGNOSIS — I10 Essential (primary) hypertension: Secondary | ICD-10-CM | POA: Diagnosis not present

## 2019-01-27 DIAGNOSIS — F1010 Alcohol abuse, uncomplicated: Secondary | ICD-10-CM | POA: Diagnosis not present

## 2019-01-27 DIAGNOSIS — I5043 Acute on chronic combined systolic (congestive) and diastolic (congestive) heart failure: Secondary | ICD-10-CM

## 2019-01-27 LAB — CUP PACEART INCLINIC DEVICE CHECK
Date Time Interrogation Session: 20210122143707
Implantable Lead Implant Date: 20150601
Implantable Lead Location: 753860
Implantable Lead Model: 295
Implantable Lead Serial Number: 134196
Implantable Pulse Generator Implant Date: 20150601
Pulse Gen Serial Number: 190689

## 2019-01-27 NOTE — Patient Instructions (Signed)
Medication Instructions:  none *If you need a refill on your cardiac medications before your next appointment, please call your pharmacy*  Lab Work:TODAY BMET BNP TSH If you have labs (blood work) drawn today and your tests are completely normal, you will receive your results only by: Marland Kitchen MyChart Message (if you have MyChart) OR . A paper copy in the mail If you have any lab test that is abnormal or we need to change your treatment, we will call you to review the results.  Testing/Procedures: none  Follow-Up: Heart Failure Clinic 6 weeks (someone will call you) At [facility], you and your health needs are our priority.  As part of our continuing mission to provide you with exceptional heart care, we have created designated Provider Care Teams.  These Care Teams include your primary Cardiologist (physician) and Advanced Practice Providers (APPs -  Physician Assistants and Nurse Practitioners) who all work together to provide you with the care you need, when you need it.  Your next appointment:   6 month(s)  The format for your next appointment:   Either In Person or Virtual  Provider:   Dr Rayann Heman  Other Instructions

## 2019-01-28 LAB — TSH: TSH: 0.319 u[IU]/mL — ABNORMAL LOW (ref 0.450–4.500)

## 2019-01-28 LAB — BASIC METABOLIC PANEL
BUN/Creatinine Ratio: 16 (ref 9–20)
BUN: 16 mg/dL (ref 6–24)
CO2: 19 mmol/L — ABNORMAL LOW (ref 20–29)
Calcium: 9.6 mg/dL (ref 8.7–10.2)
Chloride: 103 mmol/L (ref 96–106)
Creatinine, Ser: 1 mg/dL (ref 0.76–1.27)
GFR calc Af Amer: 98 mL/min/{1.73_m2} (ref >59)
GFR calc non Af Amer: 84 mL/min/{1.73_m2} (ref >59)
Glucose: 72 mg/dL (ref 65–99)
Potassium: 3.9 mmol/L (ref 3.5–5.2)
Sodium: 138 mmol/L (ref 134–144)

## 2019-01-28 LAB — PRO B NATRIURETIC PEPTIDE: NT-Pro BNP: 383 pg/mL — ABNORMAL HIGH (ref 0–210)

## 2019-01-30 ENCOUNTER — Telehealth (HOSPITAL_COMMUNITY): Payer: Self-pay | Admitting: Vascular Surgery

## 2019-01-30 NOTE — Telephone Encounter (Signed)
Left pt message giving 6 week f/u w/ db

## 2019-01-31 ENCOUNTER — Telehealth: Payer: Self-pay

## 2019-01-31 NOTE — Telephone Encounter (Signed)
Message received from [facility] that the order for CPAP has been processed and they left a message for the patient on 01/30/2019 to schedule pick up.

## 2019-02-02 ENCOUNTER — Telehealth: Payer: Self-pay

## 2019-02-02 DIAGNOSIS — Z79899 Other long term (current) drug therapy: Secondary | ICD-10-CM

## 2019-02-02 NOTE — Telephone Encounter (Signed)
The patient has been notified of the lab result and verbalized understanding.  All questions (if any) were answered. Frederik Schmidt, RN 02/02/2019 9:53 AM

## 2019-02-02 NOTE — Telephone Encounter (Signed)
-----   Message from Patsey Berthold, NP sent at 02/02/2019  8:07 AM EST ----- Please notify patient of lab results.  When he is available, please have him come by for FT3 and FT4 with low TSH and Amiodarone. Thanks!

## 2019-02-02 NOTE — Telephone Encounter (Signed)
lpmtcb 1/28

## 2019-02-02 NOTE — Telephone Encounter (Signed)
-----   Message from Stanley Berthold, NP sent at 02/02/2019  8:07 AM EST ----- Please notify patient of lab results.  When he is available, please have him come by for FT3 and FT4 with low TSH and Amiodarone. Thanks!

## 2019-02-06 ENCOUNTER — Other Ambulatory Visit: Payer: Self-pay

## 2019-02-06 ENCOUNTER — Other Ambulatory Visit: Payer: Medicaid Other | Admitting: *Deleted

## 2019-02-06 DIAGNOSIS — Z79899 Other long term (current) drug therapy: Secondary | ICD-10-CM

## 2019-02-07 ENCOUNTER — Telehealth: Payer: Self-pay

## 2019-02-07 ENCOUNTER — Encounter: Payer: Self-pay | Admitting: Critical Care Medicine

## 2019-02-07 ENCOUNTER — Other Ambulatory Visit: Payer: Self-pay

## 2019-02-07 ENCOUNTER — Ambulatory Visit: Payer: Medicaid Other | Attending: Critical Care Medicine | Admitting: Critical Care Medicine

## 2019-02-07 VITALS — BP 119/77 | HR 87 | Temp 97.9°F | Resp 16 | Ht 66.0 in | Wt 175.5 lb

## 2019-02-07 DIAGNOSIS — Z888 Allergy status to other drugs, medicaments and biological substances status: Secondary | ICD-10-CM | POA: Diagnosis not present

## 2019-02-07 DIAGNOSIS — F1010 Alcohol abuse, uncomplicated: Secondary | ICD-10-CM

## 2019-02-07 DIAGNOSIS — Z79899 Other long term (current) drug therapy: Secondary | ICD-10-CM | POA: Insufficient documentation

## 2019-02-07 DIAGNOSIS — I2510 Atherosclerotic heart disease of native coronary artery without angina pectoris: Secondary | ICD-10-CM | POA: Insufficient documentation

## 2019-02-07 DIAGNOSIS — I519 Heart disease, unspecified: Secondary | ICD-10-CM

## 2019-02-07 DIAGNOSIS — Z95810 Presence of automatic (implantable) cardiac defibrillator: Secondary | ICD-10-CM | POA: Diagnosis not present

## 2019-02-07 DIAGNOSIS — I5022 Chronic systolic (congestive) heart failure: Secondary | ICD-10-CM | POA: Insufficient documentation

## 2019-02-07 DIAGNOSIS — M109 Gout, unspecified: Secondary | ICD-10-CM | POA: Insufficient documentation

## 2019-02-07 DIAGNOSIS — F331 Major depressive disorder, recurrent, moderate: Secondary | ICD-10-CM | POA: Insufficient documentation

## 2019-02-07 DIAGNOSIS — N179 Acute kidney failure, unspecified: Secondary | ICD-10-CM

## 2019-02-07 DIAGNOSIS — I10 Essential (primary) hypertension: Secondary | ICD-10-CM | POA: Diagnosis not present

## 2019-02-07 DIAGNOSIS — I428 Other cardiomyopathies: Secondary | ICD-10-CM

## 2019-02-07 DIAGNOSIS — I110 Hypertensive heart disease with heart failure: Secondary | ICD-10-CM | POA: Insufficient documentation

## 2019-02-07 DIAGNOSIS — G4733 Obstructive sleep apnea (adult) (pediatric): Secondary | ICD-10-CM | POA: Diagnosis not present

## 2019-02-07 DIAGNOSIS — R748 Abnormal levels of other serum enzymes: Secondary | ICD-10-CM

## 2019-02-07 LAB — T4, FREE: Free T4: 1.49 ng/dL (ref 0.82–1.77)

## 2019-02-07 LAB — T3, FREE: T3, Free: 2.4 pg/mL (ref 2.0–4.4)

## 2019-02-07 NOTE — Assessment & Plan Note (Signed)
History of ethanol use the patient states he is no longer drinking at this time

## 2019-02-07 NOTE — Assessment & Plan Note (Signed)
Recent device check showed defibrillator working properly and he is being considered for another device in the research trial per cardiology and electrophysiology

## 2019-02-07 NOTE — Patient Instructions (Signed)
Stop sertraline for now  No other change medications  Referral to psychiatry will be made  Continue CPAP nightly  Return visit will be with Stanley [name] your primary care provider in 2 months

## 2019-02-07 NOTE — Progress Notes (Signed)
Here f/u  still experiencing burning sensation mid line non localized chest pains particularly in the morning. When take prescribed medication  Pain subsides Pt states did not worsen but chest pain remains the same  PHQ 9 Q6 CORE 10 is answered NO/

## 2019-02-07 NOTE — Assessment & Plan Note (Signed)
Blood pressure control is excellent given the patient's medications for his heart failure

## 2019-02-07 NOTE — Assessment & Plan Note (Signed)
Latest metabolic panel shows resolution of abnormal liver functions

## 2019-02-07 NOTE — Assessment & Plan Note (Signed)
Acute kidney injury has resolved

## 2019-02-07 NOTE — Telephone Encounter (Signed)
The patient has been notified of the lab result and verbalized understanding.  All questions (if any) were answered. Frederik Schmidt, RN 02/07/2019 8:17 AM

## 2019-02-07 NOTE — Assessment & Plan Note (Signed)
History of chronic systolic heart failure with associated palpitations and prolonged QTc interval history of ventricular  Tachycardia  Note patient on sertraline and this may be aggravating his arrhythmia therefore we will stop the sertraline at this time  Cardiology is optimized this patient's medications and we will maintain his current medication program per cardiology
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
## 2019-02-07 NOTE — Telephone Encounter (Signed)
-----   Message from Patsey Berthold, NP sent at 02/07/2019  8:00 AM EST ----- Please notify patient of normal labs

## 2019-02-07 NOTE — Assessment & Plan Note (Signed)
Patient now has a CPAP machine and is tolerating this well

## 2019-02-07 NOTE — Assessment & Plan Note (Signed)
The patient scores very high on the PHQ-9 and even has had thoughts of being better off dead than alive though no specific plans for suicidality  Sertraline is not a clean enough medication for him and I believe psychiatry input is imperative given his cardiac history and severe depression  I will refer him to psychiatry for further evaluation to see if there is a medication that might work better for him and yet not interfere with his cardiac condition particular his prolonged QTC

## 2019-02-28 ENCOUNTER — Encounter: Payer: Medicaid Other | Admitting: *Deleted

## 2019-02-28 ENCOUNTER — Other Ambulatory Visit: Payer: Self-pay

## 2019-02-28 DIAGNOSIS — Z006 Encounter for examination for normal comparison and control in clinical research program: Secondary | ICD-10-CM

## 2019-03-06 ENCOUNTER — Other Ambulatory Visit: Payer: Self-pay | Admitting: *Deleted

## 2019-03-06 DIAGNOSIS — Z006 Encounter for examination for normal comparison and control in clinical research program: Secondary | ICD-10-CM

## 2019-03-06 NOTE — Progress Notes (Signed)
Orders for batwire

## 2019-03-07 ENCOUNTER — Telehealth: Payer: Self-pay

## 2019-03-07 NOTE — Telephone Encounter (Signed)
Signed CMN faxed to [facility] [employer]

## 2019-03-07 NOTE — Research (Signed)
Patient came in today to talk about BatWire research study. Reviewed the study and answered all questions. He took consent and information pamphlet home to review with his family. Will call me back either way.

## 2019-03-08 ENCOUNTER — Ambulatory Visit (HOSPITAL_COMMUNITY)
Admission: RE | Admit: 2019-03-08 | Discharge: 2019-03-08 | Disposition: A | Discharge status: Home / Self Care | Payer: Medicaid Other | Source: Ambulatory Visit | Attending: Internal Medicine | Admitting: Internal Medicine

## 2019-03-08 ENCOUNTER — Encounter: Payer: Medicaid Other | Admitting: *Deleted

## 2019-03-08 ENCOUNTER — Other Ambulatory Visit: Payer: Self-pay

## 2019-03-08 ENCOUNTER — Ambulatory Visit (HOSPITAL_COMMUNITY): Admission: RE | Admit: 2019-03-08 | Payer: Medicaid Other | Source: Ambulatory Visit

## 2019-03-08 VITALS — BP 145/90 | HR 87

## 2019-03-08 DIAGNOSIS — Z006 Encounter for examination for normal comparison and control in clinical research program: Secondary | ICD-10-CM

## 2019-03-08 DIAGNOSIS — I5022 Chronic systolic (congestive) heart failure: Secondary | ICD-10-CM

## 2019-03-08 NOTE — Progress Notes (Signed)
Carotid artery duplex exam completed.  Preliminary results can be found under CV proc under chart review.  03/08/2019 1:26 PM  Ilina Xu, K., RDMS, RVT

## 2019-03-08 NOTE — Research (Signed)
Section A:  Administrative Section  Subject ID: [id number] - 003 - 015 Subject Initials: C -S  Date subject signed informed consent     03/_MAR/ 2021_      (DD /  MMM      / [place] Breeding)  Section B:  Enrollment Criteria  Does the subject meet the FDA Indication for Use: Patients who remain symptomatic despite treatment with guideline-directed medical therapy, are NYHA Class III or Class II (who had a recent history of Class III), have a left ventricular ejection fraction ? 35%, a NT-proBNP < 1600 pg/ml and excluding patients indicated for Cardiac Resynchronization Therapy (CRT) according to AHA/ACC/ESC guidelines '[x]'$  Yes    '[]'$  No (STOP - subject does not qualify for the study)  Has the subject been previously, or are currently, randomized in the CVRx BeAT-HF Trial? '[]'$  Yes (STOP - subject does not qualify for the study)   '[x]'$  No  Has the subject received cardiac resynchronization therapy (CRT) within six months of enrollment, or is actively receiving CRT? '[]'$  Yes (STOP - subject does not qualify for the study)   '[x]'$  No  Section B:  Screening/Baseline Assessments   Physical Assessment:            Date: 03/_MAR_/_2021______ (DD / MMM / [place] Breeding)  Weight: 77.84_____  '[x]'$  kg '[]'$  pounds Height: _168_ '[x]'$  cm '[]'$  inches  Blood Pressure: _145_  / _90__mmHg Heart Rate: 87_ bpm  Section C:  Labs   eGFR:  ___83___ mL/min/1.53m Date:    __03/_MAR_/_2021_                  (DD / MMM / Y[place] Breeding  Negative Pregnancy Test? '[x]'$  N/A Date:    _____/_______/_______                  (DD / MMM / Y[place] Breeding   '[]'$  Yes     '[]'$  No   Section D:  Appropriate Surgical/Study Candidate:   Is the subject able to discontinue the use of antiplatelet drugs (e.g. aspirin) in advance of the procedure, if required? '[]'$  Yes   '[]'$  No  Assessed by:   ________________________ Implanting Physician '[]'$  Yes Date:    _____/_______/_______                 (DD / MMM / Y[place] Breeding   '[]'$  No   Section E:  Inclusion/Exclusion Criteria  Does the subject meet all Inclusion  Criteria? '[]'$  Yes   '[x]'$  No (STOP - subject does not qualify for the study)   Check all that apply   '[]'$  1. Age at least 21 years and no more than 80 years at the time of enrollment.   '[x]'$  2. Appropriate candidate for the surgery as determined by an evaluation from the implanting physician using a carotid duplex ultrasound (CDU) [or Computed Tomography Angiography (CTA) if CDU inconclusive or incomplete] and a review of medical history (including existence of infections that may increase implant risk).  Evaluation must confirm the following within 30 days of the BAROSTIM NEO implant: . Appropriate medical condition and medical history for implantation of the BAROSTIM NEO System AND . Anatomy that enables this implant procedure, with no vascular structures or orientations or neck anomalies that would be obstructive to the implantation path AND . The artery planned for the BAROSTIM NEO implant must have: o A carotid bifurcation below the level of the mandible AND o No ulcerative carotid arterial plaques AND o No carotid atherosclerosis producing a 30% or greater  stenosis in linear diameter in the internal carotid AND o No carotid atherosclerosis producing a 30% or greater stenosis in linear diameter in the distal common carotid AND . Have had no prior surgery, radiation, or endovascular stent placement in the carotid artery or the carotid sinus region AND . Able to discontinue the use of antiplatelet drugs (e.g. aspirin) in advance of the procedure, if required   '[]'$  3. Six-minute hall walk (6MHW) ? 150 m AND ? 400 m within 30 days prior to implant.   '[]'$  4. Serum estimated glomerular filtration rate (eGFR) ? 25 mL/min/1.73 m2 using the CKD-EPI method within 30 days prior to the [facility] NEO implant.   '[]'$  5. Body mass index ? 40 kg/m2 within 30 days prior to the [facility] NEO implant.   '[]'$  6. If male and of childbearing potential, must use a medically accepted method of birth control (e.g., barrier method  with spermicide, oral contraceptive, or abstinence) and agree to continue use of this method for the duration of the study. Women of childbearing potential must have a negative pregnancy test within 14 days prior to the [facility] NEO implant.   '[]'$  7. Subjects implanted with a cardiac rhythm management device that does not utilize an intracardiac lead, or implanted with a neurostimulation device, must be approved by the [place].   '[]'$  8. Signed a CVRx-approved informed consent form for participation in this study.      Does the subject meet any Exclusion Criteria? '[]'$  Yes (STOP - subject does not qualify for the study)   '[x]'$  No     List criteria # met: __________________________   '[]'$  1. Previously or currently randomized in the CVRx BeAT-HF Trial.   '[]'$  2. Received cardiac resynchronization therapy (CRT) within six months of enrollment or is actively receiving CRT.   '[]'$  3. Any of the following contraindications: . Baroreflex failure or autonomic neuropathy  . Uncontrolled, symptomatic cardiac bradyarrhythmias . Known allergy to silicone or titanium   '[]'$  4. Unstable ventricular arrhythmias.   '[]'$  5. Presence of baseline cranial nerve dysfunction determined by the Ear, Nose and Throat (ENT) examination.   '[]'$  6. Subjects with any surgery that has occurred, or is planned to occur, within 45 days of the [facility] NEO implant.   '[]'$  7. Recent history (within 6 months of implant) of significant and uncontrolled bleeding.   '[]'$  8. Known and untreated hypercoagulability state.   '[]'$  9. An inappropriate study candidate as evidenced by: Marland Kitchen Solid organ or hematologic transplant, or currently being evaluated for an organ transplant. . Has received or is receiving LVAD therapy or chronic dialysis. . Current or planned treatment with intravenous positive inotrope therapy. . Primary pulmonary hypertension. . Severe COPD or severe restrictive lung disease (e.g. requires chronic oral steroid use or home  oxygen use). .  Heart failure secondary to a reversible cause, such as cardiac structural valvular disease, acute myocarditis and pericardial constriction. . Clinically significant cardiac structural valvular disease. .  Unable or unwilling to fulfill the protocol medication compliance and follow-up requirements, for reasons including but not limited to an unresolved history of alcohol or substance abuse or psychiatric disorder. . Active malignancy. .  Any other serious medical condition that may adversely affect the safety of the participant or validity of the study, in the opinion of the investigator. . Life expectancy less than one year.   '[]'$  10. Any of the following within 3 months prior to the [facility] NEO implant. . Myocardial infarction .  Unstable angina . Coronary intervention (e.g. CABG or PTCA)  . Cerebral vascular accident or transient ischemic attack . Sudden cardiac death  . Surgical cardiac intervention (e.g. cardiac ablation, valve replacement)   '[]'$  11. Enrolled and active in another (e.g. device, pharmaceutical, or biological) clinical study unless approved by the [place].  Section F:  Adverse Events  Were there any Adverse Events that occurred since consent? '[]'$  Yes (complete AE form)   '[x]'$  No  Section H:  Medication Changes   Have there been any changes to the subject's home use medications for Arrhythmia, Antiplatelet/Anticoagulation and Heart failure medications during screening and baseline? '[]'$  Yes (update Med form)   '[x]'$  No  Section I:  Signature    Person completing form (Print Name): _______________________________________________________    Signature: ______________________________________________ Date: __________________________

## 2019-03-08 NOTE — Research (Signed)
[place] Informed Consent   Subject Name: Stanley [name]  Subject met inclusion and exclusion criteria.  The informed consent form, study requirements and expectations were reviewed with the subject and questions and concerns were addressed prior to the signing of the consent form.  The subject verbalized understanding of the trial requirements.  The subject agreed to participate in the [place] trial and signed the informed consent at 1210 on 03-08-19  The informed consent was obtained prior to performance of any protocol-specific procedures for the subject.  A copy of the signed informed consent was given to the subject and a copy was placed in the subject's medical record.   [name]

## 2019-03-09 LAB — BASIC METABOLIC PANEL
BUN/Creatinine Ratio: 17 (ref 9–20)
BUN: 19 mg/dL (ref 6–24)
CO2: 20 mmol/L (ref 20–29)
Calcium: 9.6 mg/dL (ref 8.7–10.2)
Chloride: 98 mmol/L (ref 96–106)
Creatinine, Ser: 1.14 mg/dL (ref 0.76–1.27)
GFR calc Af Amer: 83 mL/min/{1.73_m2} (ref >59)
GFR calc non Af Amer: 72 mL/min/{1.73_m2} (ref >59)
Glucose: 99 mg/dL (ref 65–99)
Potassium: 3.8 mmol/L (ref 3.5–5.2)
Sodium: 137 mmol/L (ref 134–144)

## 2019-03-10 ENCOUNTER — Other Ambulatory Visit: Payer: Self-pay

## 2019-03-10 ENCOUNTER — Ambulatory Visit (HOSPITAL_COMMUNITY)
Admission: RE | Admit: 2019-03-10 | Discharge: 2019-03-10 | Disposition: A | Discharge status: Home / Self Care | Payer: Medicaid Other | Source: Ambulatory Visit | Attending: Internal Medicine | Admitting: Internal Medicine

## 2019-03-10 DIAGNOSIS — I10 Essential (primary) hypertension: Secondary | ICD-10-CM | POA: Insufficient documentation

## 2019-03-10 DIAGNOSIS — Z95810 Presence of automatic (implantable) cardiac defibrillator: Secondary | ICD-10-CM | POA: Insufficient documentation

## 2019-03-10 DIAGNOSIS — Z006 Encounter for examination for normal comparison and control in clinical research program: Secondary | ICD-10-CM

## 2019-03-10 NOTE — Progress Notes (Signed)
  Echocardiogram 2D Echocardiogram has been performed.  Stanley [name] 03/10/2019, 1:52 PM

## 2019-03-13 ENCOUNTER — Encounter (INDEPENDENT_AMBULATORY_CARE_PROVIDER_SITE_OTHER): Payer: Self-pay | Admitting: Otolaryngology

## 2019-03-13 ENCOUNTER — Telehealth: Payer: Self-pay | Admitting: Internal Medicine

## 2019-03-13 ENCOUNTER — Other Ambulatory Visit: Payer: Self-pay

## 2019-03-13 ENCOUNTER — Ambulatory Visit (INDEPENDENT_AMBULATORY_CARE_PROVIDER_SITE_OTHER): Payer: Medicaid Other | Admitting: Otolaryngology

## 2019-03-13 VITALS — Temp 97.7°F

## 2019-03-13 DIAGNOSIS — Z006 Encounter for examination for normal comparison and control in clinical research program: Secondary | ICD-10-CM

## 2019-03-13 NOTE — Progress Notes (Addendum)
Subject ID: __[id number]____ - _003____ - 015 Subject Initials: _c-s__ ___ ___ Visit Interval:  (* = as required) [x]   Baseline     []  Implant/Pre D/C  Date of Report:   ___03__/__08_____/__ 2021_____       (DD / MMM / YYYY)  []   1 Month     []  3 Month*  Name of ENT _____________________________________  []   6 Month*     []  12 Month*     []  Unscheduled*  Directions:  Baseline: complete sections B-E only   All other visits, complete all sections below (B-G)   Use comments box to document any abnormalities or changes from Baseline  Was the CVRx device turned off? []  Yes   []  No, specify reason why not: __________________________________  Was a laryngoscope used? [x]  Yes   []  No  Section B:  Dysphonia   Global Rating of Dysphonia: (Absence of recent upper respiratory infection or recent extensive voice abuse)   [x]   Normal (or clear)   []   Mild   []   Mild to Moderate   []   Moderate   []   Moderate to Severe   []   Severe   []   Aphonic  Section C:  Tongue/Pharynx   Assessment Observation Severity (complete only if abnormal); defined as:    Mild Significant impairment of functioning; patient is unable to carry out usual activities.    Moderate Patient experiences sufficient discomfort to interfere with or reduce their usual level of activity.    Severe Clinically evident impairment of functioning; patient is unable to carry out usual activities.  Pharynx at Rest [x]   Symmetric []   Mild   []  Asymmetric []  Moderate     []  Severe  Pharynx with Phonation ("ah") [x]   Symmetric []   Mild   []  Asymmetric []  Moderate     []  Severe  Tongue Atrophy [x]   Not Present []   Mild   []  Present []  Moderate     []  Severe  Tongue Mobility [x]   Normal []   Mild   []  Reduced []  Moderate     []  Severe  Tongue Protrusion [x]   Midline []   Mild Side: ____________   []  Deviation []  Moderate      []  Severe   Sensation of the Pharynx [x]   Normal []   Mild   []  Decreased []  Moderate     []  Severe  Section D:   House-Brackman Facial Paralysis Scale (Circle One)  Grade Impairment  I           x Normal  II Mild Dysfunction (slight weakness, normal symmetry at rest)  III Moderate Dysfunction (obvious but not disfiguring weakness with synkinesis, normal symmetry at rest); Complete eye closure with maximal effort; Good forehead movement  IV Moderately severe dysfunction (obvious and disfiguring asymmetry, significant synkinesis); Incomplete eye closure; moderate forehead movement  V Severe dysfunction (barely perceptible motion  VI Total paralysis (no movement)  Section E:  Baseline Cranial Nerve Damage  Does the subject have a presence of baseline cranial nerve dysfunction []  Yes, specify what dysfunction: _____________________________________   [x]  No  Section F:  ENT Notes (Pre-D/C and Follow-Up)  Were changes in Dysphonia from baseline or any abnormalities related to the [facility] Implant? []   Yes   []  No   []  Unknown   []  Not Applicable  Were changes in Tongue/Pharynx from baseline or any abnormalities related to the [facility] Implant? []   Yes   []  No   []  Unknown   []   Not Applicable  Were changes in House-Brackman Facial Paralysis Scale from baseline or any abnormalities related to the [facility] Implant? []   Yes   []  No   []  Unknown   []  Not Applicable  Were any of the above changes from baseline potentially related to the intubation for the procedure? []   Yes   []  No   []  Unknown   []  Not Applicable  Section G:  Cranial Nerves  Has there been new cranial nerve dysfunction since Baseline?   Marginal mandibular branch of cranial nerve VII (Facial Nerve) []  Yes, specify what changed: ___________________________________   []  No  Cranial nerve IX (Glossopharyngeal Nerve) []  Yes, specify what changed: ___________________________________   []  No  Cranial nerve X  (Vagus Nerve) []  Yes, specify what changed: ___________________________________   []  No  Cranial nerve XI  (Accessory Nerve) []  Yes,  specify what changed: ___________________________________   []  No  Cranial nerve XII  (Hypoglossal Nerve) []  Yes, specify what changed: ___________________________________   []  No  Section H:  Comments/Notes/Describe any abnormalities/deficiencies (as well if post-operative change)    Patient has normal cranial nerve function and normal vocal cord function on fiberoptic laryngoscopy.  House Brackman facial assessment was grade I      Section I:  Signature   Signature of ENT assessing subject: __________________________________  ____________________ (Date)

## 2019-03-13 NOTE — Progress Notes (Signed)
This is our second batwire patient.

## 2019-03-13 NOTE — Telephone Encounter (Signed)
Left message for myra.  Advised this cardiac CT was ordered by research.  Advised to call Peter Minium if she had any further questions.

## 2019-03-13 NOTE — Telephone Encounter (Signed)
Stanley [name] is calling stating prior authorization is needed for Stanley [name]'s CT scheduled for 03/15/19. Please advise.

## 2019-03-14 ENCOUNTER — Telehealth (HOSPITAL_COMMUNITY): Payer: Self-pay

## 2019-03-14 NOTE — Telephone Encounter (Signed)

## 2019-03-15 ENCOUNTER — Ambulatory Visit (HOSPITAL_COMMUNITY)
Admission: RE | Admit: 2019-03-15 | Discharge: 2019-03-15 | Disposition: A | Discharge status: Home / Self Care | Payer: Medicaid Other | Source: Ambulatory Visit | Attending: Internal Medicine | Admitting: Internal Medicine

## 2019-03-15 ENCOUNTER — Other Ambulatory Visit: Payer: Self-pay

## 2019-03-15 VITALS — BP 136/90 | HR 91 | Wt 176.4 lb

## 2019-03-15 DIAGNOSIS — I10 Essential (primary) hypertension: Secondary | ICD-10-CM

## 2019-03-15 DIAGNOSIS — I130 Hypertensive heart and chronic kidney disease with heart failure and stage 1 through stage 4 chronic kidney disease, or unspecified chronic kidney disease: Secondary | ICD-10-CM | POA: Insufficient documentation

## 2019-03-15 DIAGNOSIS — G4733 Obstructive sleep apnea (adult) (pediatric): Secondary | ICD-10-CM | POA: Diagnosis not present

## 2019-03-15 DIAGNOSIS — Z95810 Presence of automatic (implantable) cardiac defibrillator: Secondary | ICD-10-CM | POA: Insufficient documentation

## 2019-03-15 DIAGNOSIS — N179 Acute kidney failure, unspecified: Secondary | ICD-10-CM | POA: Diagnosis not present

## 2019-03-15 DIAGNOSIS — I5022 Chronic systolic (congestive) heart failure: Secondary | ICD-10-CM | POA: Diagnosis not present

## 2019-03-15 DIAGNOSIS — I2510 Atherosclerotic heart disease of native coronary artery without angina pectoris: Secondary | ICD-10-CM | POA: Diagnosis not present

## 2019-03-15 DIAGNOSIS — N183 Chronic kidney disease, stage 3 unspecified: Secondary | ICD-10-CM | POA: Diagnosis not present

## 2019-03-15 DIAGNOSIS — Z9114 Patient's other noncompliance with medication regimen: Secondary | ICD-10-CM | POA: Diagnosis not present

## 2019-03-15 DIAGNOSIS — R0602 Shortness of breath: Secondary | ICD-10-CM | POA: Diagnosis present

## 2019-03-15 DIAGNOSIS — M109 Gout, unspecified: Secondary | ICD-10-CM | POA: Diagnosis not present

## 2019-03-15 DIAGNOSIS — Z006 Encounter for examination for normal comparison and control in clinical research program: Secondary | ICD-10-CM

## 2019-03-15 DIAGNOSIS — F329 Major depressive disorder, single episode, unspecified: Secondary | ICD-10-CM | POA: Insufficient documentation

## 2019-03-15 DIAGNOSIS — Z79899 Other long term (current) drug therapy: Secondary | ICD-10-CM | POA: Insufficient documentation

## 2019-03-15 MED ORDER — LOSARTAN POTASSIUM 100 MG PO TABS: 100.0000 mg | ORAL_TABLET | Freq: Every day | ORAL | 6 refills | Status: DC

## 2019-03-15 MED ORDER — IOHEXOL 350 MG/ML SOLN
75.0000 mL | Freq: Once | INTRAVENOUS | Status: AC | PRN
  Administered 2019-03-15: 16:00:00 75 mL via INTRAVENOUS

## 2019-03-15 NOTE — Patient Instructions (Signed)
Increase Losartan to 100 mg daily  Labs in 2 weeks  Your physician recommends that you schedule a follow-up appointment in: 3-4 months  If you have any questions or concerns before your next appointment please send [place] a message through [place] or call our office at [phone].  At the [facility], you and your health needs are our priority. As part of our continuing mission to provide you with exceptional heart care, we have created designated Provider Care Teams. These Care Teams include your primary Cardiologist (physician) and Advanced Practice Providers (APPs- Physician Assistants and Nurse Practitioners) who all work together to provide you with the care you need, when you need it.   You may see any of the following providers on your designated Care Team at your next follow up: Marland Kitchen Dr Glori Bickers . Dr Loralie Champagne . Darrick Grinder, NP . Lyda Jester, PA . Audry Riles, PharmD   Please be sure to bring in all your medications bottles to every appointment.

## 2019-03-15 NOTE — Research (Signed)
Patient ID: _[id number]__ - __003_ - 015 Patient Initials: _C__ _-__ _S  Visit Interval:    [x]   Screening/Baseline    []  Activation   []  0.5 Month       []  1 Month     []  2 Month     []  3 Month       []  6 Month     []  12 Month         []  Unscheduled, reason for visit: _________________________________________  SECTION B:  [id number] Like Illness Symptoms   Has the subject experienced any cold, flu or COVID-19 symptoms in the past 30 days? [x]   No  (skip to section C)     []  Yes, date of onset:  _____/_____ MMM/YYYY    If yes, check all symptoms that apply:   []  Fevers or chills   []   New or Worsening Cough  []  Productive  []  Dry     If yes to cough, indicate severity  []  Constant  []  Occasional, several per hour   []  New or worsened shortness of breath   []  Diarrhea   []  Altered or reduced sense of smell or taste   []  Muscle aches/Severe Fatigue   []  Chest pain or tightness   []  Sore throat   []  Nausea or vomiting  SECTION C:  COVID-19 Like Illness Testing   Has the patient been tested for COVID-19? []  No     [x]  Yes, date of test:  __DEC___/__2020__ MMM/YYYY    If yes, test results:   []  Positive [x]  Negative []  Unknown  Has the patient been tested for COVID-19 Antibodies? [x]  No     []  Yes, date of test:  _____/_____ MMM/YYYY    If yes, test results:   []  Positive []  Negative []  Unknown  Has the patient been vaccinated for COVID-19? [x]  No     []  Yes, date of test:  _____/_____ MMM/YYYY  Has the patient been tested for Influenza ("flu")? []  No     [x]  Yes, date of test:  _DEC__/_2020__ MMM/YYYY    If yes, test results:   []  Positive [x]  Negative []  Unknown  Has the patient been vaccinated for Influenza ("flu")? [x]  No     []  Yes, date of test:  _____/_____ MMM/YYYY  SECTION D:  COVID-19 Like Illness Exposure  Has the subject been told that they might have had COVID-19/have symptoms suggestive of COVID-19? [x]    No   []  Yes   []   Unknown  Has the subject been exposed to  anyone with known or suspected COVID-19? [x]    No   []  Yes   []   Unknown  Has the subject been told that they might have had the "flu" or influenza? [x]    No   []  Yes   []   Unknown  SECTION E:  Effects of COVID Pandemic on [place] Interactions  Since the last study visit, did the subject feel the need to go to an emergency department or hospital for their heart failure but decided not to because of concerns about COVID-19? [x]   No   []  Yes   []  Unknown    If yes, how did they seek care (check all that apply):   []  Telemedicine visit []  In-person []  Clinic []  Urgent Care   []  Subject did not change hospital/ER use due to COVID-19 []  Other, specify: ________________________  Since the last study visit, did the subject have a cardiology/HF related appointment cancelled/rescheduled due to  COVID-19 pandemic? [x]   No   []  Yes, how many? ___________   []  Unknown  Since the last study visit, did the subject have any cardiology/HF related telemedicine visit due to COVID-19 pandemic? [x]   No   []  Yes, how many? ___________   []  Unknown  Since the last study visit, did the subject have a cardiology/HF procedure cancelled/rescheduled due to COVID-19 pandemic? [x]   No   []  Yes, how many? ___________   []  Unknown  SECTION F:  Effects of COVID Pandemic on Subject's Medications  Since the last visit, did any of their heart failure medications change or stop, even for a short time?   If yes, enter change into medication eCRF [x]   No   []  Yes, how many? ___________   []  Unknown    If yes, why:   []  Instructed by Doctor []  Self-discontinued []  Unknown []  Other: ________________  Since the last visit, was the subject prescribed any medications for COVID-19? [x]   No   []  Yes   []  Unknown    If yes, what medications (generic name):  SECTION G:  Effects of COVID Pandemic on Subject's Lifestyle  How has the subject's activity/exercise level changed due to COVID-19 pandemic? [x]   No change    []  More activity/exercise   []  Less activity/exercise  How has the subject's smoking habits changed due to COVID-19? [x]   Does not smoke   []  No change   []  Smoke more   []  Smoke less  How has the subject's alcohol drinking habits changed due to COVID-19? []   Does not drink   [x]  No change   []  Drink more   []  Drink less  Section H:  Signature    Person completing form (Print Name): _______________________________________________________    Signature: ______________________________________________ Date: __________________________

## 2019-03-15 NOTE — Progress Notes (Signed)
ADVANCED HF CLINIC CONSULT NOTE  Referring Physician: Dr. Rayann Heman Primary Cardiologist: Dr. Rayann Heman   HPI:  Stanley [name] is a 54 y.o. male with a hx of NICM in 2006 (felt 2/2 HTN/ETOH), ? CAD s/p PCI to unknown vessel 2011 at outside location (cath 2014 without CAD and normal coronaries by CT 07/2016) eventually a [employer] Sci ICD implant done in [place] 2015, VT 2017, noncompliance, chronic chest pain of noncardiac source, depression w/ETOH abuse, HTN, gout who is being seen today for the evaluation of HF at the request of Dr. Rayann Heman.   In 2017 he received inappropriate therapy for ST with ATP (after a work out at [employer] and reported noncompliance with meds) that degenerated into VT and received several shocks. He has had issues with chronic chest pain without evidence for ischemic etiology including normal cath and cardiac CT as above. This is relatively unchanged. Echo 03/2016  EF 35-40%, grade 1 DD, mild MR, moderate LAE  Had recurrent ICD shock in 3/20 in setting of medicine noncompliance. Amio continued. Echo 3/20 EF 25-30% Normal RV  Here for routine f/u. Says he is getting around a whole lot better. Still gets SOB with mild to moderate activity. Says he ahs to park close to the store when he goes. Avoids steps. No edema, orthopnea or PND Sharp pain in the chest lasts 6-7 mins goes away. Worse when having BM. No edema. Has enrolled in Batwire trial. Cut ETOH back big time.    CPX 09/19/18   FVC 1.96 (57%)    FEV1 1.60 (58%)     FEV1/FVC 81 (101%)     MVV 80 (61%)      Resting HR: 77 Peak HR: 131  (79% age predicted max HR)  BP rest: 168/80     Standing BP: 156/74 BP peak: 196/80   Peak VO2: 14.9 (52% predicted peak VO2)  VE/VCO2 slope: 31  OUES: 1.43  Peak RER: 1.05  VE/MVV: 50% O2pulse: 10  (67% predicted O2pulse)   Moderate to severe HF limitation but normal VeVCO2 slope is reassuring. Restrictive lung physiology.   Past Medical History:  Diagnosis Date   . Acute renal failure (ARF) ([place])   . Acute respiratory failure with hypoxia ([place]) 12/29/2018  . Alcohol abuse   . CAD (coronary artery disease)    a. dx unclear, reported PCI in 2011 but cath 2014 normal coronaries and cardiac CT 07/2016 normal coronaries, no mention of stents.  . Chronic chest pain   . Chronic systolic CHF (congestive heart failure) ([place])   . Depression   . Financial difficulties   . Gout   . Gouty arthritis   . History of noncompliance with medical treatment    financial challenges  . Hypertension   . ICD (implantable cardioverter-defibrillator) in place    [place] SciICD implant done in [place] 2015  . Insomnia   . Lobar pneumonia ([place]) 12/28/2018  . Nonischemic cardiomyopathy ([place])   . Ventricular tachycardia ([place]) 01/2015   2017 - ATP delivered for sinus tach, degenerated into VT for which he received shock. Recurrent VT 03/2018.    Current Outpatient Medications  Medication Sig Dispense Refill  . amiodarone (PACERONE) 200 MG tablet Take 1 tablet (200 mg total) by mouth daily. 90 tablet 3  . atorvastatin (LIPITOR) 40 MG tablet Take 1 tablet (40 mg total) by mouth daily. 90 tablet 3  . busPIRone (BUSPAR) 10 MG tablet Take 1 tablet (10 mg total) by mouth daily. 60 tablet 1  .  carvedilol (COREG) 25 MG tablet Take 1 tablet (25 mg total) by mouth 2 (two) times daily with a meal. 180 tablet 3  . digoxin (LANOXIN) 0.125 MG tablet Take 1 tablet (0.125 mg total) by mouth daily. 90 tablet 3  . furosemide (LASIX) 80 MG tablet Take 1 tablet (80 mg total) by mouth See admin instructions. Take 1 tablet (80 mg total) by mouth every Monday, Wednesday, and Friday. May also take 1 tablet (80 mg total) as needed for swelling. 60 tablet 2  . isosorbide-hydrALAZINE (BIDIL) 20-37.5 MG tablet Take 1 tablet by mouth 3 (three) times daily. 90 tablet 3  . losartan (COZAAR) 50 MG tablet Take 1 tablet (50 mg total) by mouth daily. 90 tablet 3  . nitroGLYCERIN (NITROSTAT) 0.4 MG SL tablet Place  1 tablet (0.4 mg total) under the tongue every 5 (five) minutes x 3 doses as needed for chest pain. 30 tablet 6  . potassium chloride SA (KLOR-CON) 20 MEQ tablet Take 1 tablet (20 mEq total) by mouth daily. 90 tablet 3  . spironolactone (ALDACTONE) 25 MG tablet Take 0.5 tablets (12.5 mg total) by mouth daily. 30 tablet 2  . tamsulosin (FLOMAX) 0.4 MG CAPS capsule Take 1 capsule (0.4 mg total) by mouth daily. 30 capsule 3   No current facility-administered medications for this encounter.    Allergies  Allergen Reactions  . Lisinopril Shortness Of Breath  . Apple Swelling    Lip Swelling  . Other Swelling    Nuts - Lip Swelling  . Shellfish Allergy Swelling    Lip Swelling      Social History   Socioeconomic History  . Marital status: Divorced    Spouse name: Not on file  . Number of children: 1  . Years of education: 71  . Highest education level: Not on file  Occupational History  . Occupation: Disability  Tobacco Use  . Smoking status: Never Smoker  . Smokeless tobacco: Never Used  Substance and Sexual Activity  . Alcohol use: Yes    Alcohol/week: 0.0 standard drinks  . Drug use: No  . Sexual activity: Not Currently  Other Topics Concern  . Not on file  Social History Narrative   Lives in [place] alone.  Disabled.  Previously worked as a Careers adviser.   Fun: Rest, Read and watch movies.    Social Determinants of Health   Financial Resource Strain: Medium Risk  . Difficulty of Paying Living Expenses: Somewhat hard  Food Insecurity: Food Insecurity Present  . Worried About Charity fundraiser in the Last Year: Sometimes true  . Ran Out of Food in the Last Year: Sometimes true  Transportation Needs: No Transportation Needs  . Lack of Transportation (Medical): No  . Lack of Transportation (Non-Medical): No  Physical Activity: Unknown  . Days of Exercise per Week: Patient refused  . Minutes of Exercise per Session: Patient refused  Stress: Stress Concern  Present  . Feeling of Stress : To some extent  Social Connections: Unknown  . Frequency of Communication with Friends and Family: Patient refused  . Frequency of Social Gatherings with Friends and Family: Patient refused  . Attends Religious Services: Patient refused  . Active Member of Clubs or Organizations: Patient refused  . Attends Archivist Meetings: Patient refused  . Marital Status: Patient refused  Intimate Partner Violence: Not At Risk  . Fear of Current or Ex-Partner: No  . Emotionally Abused: No  . Physically Abused: No  . Sexually  Abused: No      Family History  Problem Relation Age of Onset  . Cancer Mother        Pancreatic  . Hypertension Father   . Alzheimer's disease Father   . Gout Father   . Dementia Father   . Hypertension Sister   . Clotting disorder Sister   . Obesity Brother   . Bronchitis Brother   . Heart disease Maternal Grandmother   . Gout Paternal Grandfather     Vitals:   03/15/19 1214  BP: 136/90  Pulse: 91  SpO2: 97%  Weight: 80 kg (176 lb 6.4 oz)    PHYSICAL EXAM: General:  Well appearing. No resp difficulty HEENT: normal Neck: supple. no JVD. Carotids 2+ bilat; no bruits. No lymphadenopathy or thryomegaly appreciated. Cor: PMI nondisplaced. Regular rate & rhythm. No rubs, gallops or murmurs. Lungs: clear Abdomen: soft, nontender, nondistended. No hepatosplenomegaly. No bruits or masses. Good bowel sounds. Extremities: no cyanosis, clubbing, rash, edema Neuro: alert & orientedx3, cranial nerves grossly intact. moves all 4 extremities w/o difficulty. Affect pleasant   ECG 03/15/19: NSR 89 IVCD 147ms LVH with non-specific ST abnormalities Personally reviewed    ASSESSMENT & PLAN:  1. Chronic systolic heart failure - Mostly NICM (? H/o PCI to unknown vessel in 2011.) CTA 2018 normal cors - s/p [employer] ICD - EF 25-30% echo 3/20 - Echo 03/10/19 EF 25-30% RV ok Mild MR - Stable NYHA III - CPX 9/20 moderate to severe HF  limitation. Peak VO2: 14.9 (52% predicted peak VO2). VE/VCO2 slope: 31  OUES: 1.43 Peak RER: 1.05  - Volume status ok on lasix MWF - Continue carvedilol 25 bid - Increase losartan to 100 daily  (No ARNI with angioedema with ACE-I) - Continue digoxin 0.125 daily - Continue spiro 12.5 - Continue Bidil 1 tid - Consider Farxiga at next visit  - Continue to follow closely for need for advanced therapies. For Batwire implant on 03/29/19 - ICD interrogated personally. No further VT. Activity 4 hrs per day.   2. VT - quiescent on amio - followed by Dr. Rayann Heman - No VT on ICD interrogation today  3. HTN - BP ok. Increasing losartan as above  4. OSA - using CPAP  5. CKD 3 - creatinine 1.3-1.6 - recheck today  6. CAD  - h/o single vessel PCI by reo - continue ASA/statin  7. Depession - h/o suicidal ideation - brother killed in 2014 - Much improved  8. ETOH - likely related to depression - reports he has cut back dramatically. Congratulated him .   Glori Bickers, MD  12:37 PM

## 2019-03-16 ENCOUNTER — Telehealth: Payer: Self-pay | Admitting: *Deleted

## 2019-03-16 NOTE — Progress Notes (Signed)
Batwire pt # 3

## 2019-03-16 NOTE — Telephone Encounter (Signed)
LMTCB to see if patient can come in next Tuesday, 3-16 at 2:30pm to see Dr. Trula Valiente and go over last minute BATwire questions.

## 2019-03-21 ENCOUNTER — Encounter: Payer: Self-pay | Admitting: *Deleted

## 2019-03-23 NOTE — Research (Signed)
Patient don't meet due to both of the bifurcations are right at the angle of mandible.

## 2019-03-29 ENCOUNTER — Encounter (HOSPITAL_COMMUNITY): Payer: Self-pay

## 2019-03-29 ENCOUNTER — Ambulatory Visit (INDEPENDENT_AMBULATORY_CARE_PROVIDER_SITE_OTHER): Payer: Medicaid Other | Admitting: *Deleted

## 2019-03-29 ENCOUNTER — Ambulatory Visit (HOSPITAL_COMMUNITY): Admit: 2019-03-29 | Payer: Medicaid Other | Admitting: Internal Medicine

## 2019-03-29 DIAGNOSIS — I5022 Chronic systolic (congestive) heart failure: Secondary | ICD-10-CM | POA: Diagnosis not present

## 2019-03-29 LAB — CUP PACEART REMOTE DEVICE CHECK
Battery Remaining Longevity: 114 mo
Battery Remaining Percentage: 100 %
Brady Statistic RV Percent Paced: 0 %
Date Time Interrogation Session: 20210324053300
HighPow Impedance: 48 Ohm
Implantable Lead Implant Date: 20150601
Implantable Lead Location: 753860
Implantable Lead Model: 295
Implantable Lead Serial Number: 134196
Implantable Pulse Generator Implant Date: 20150601
Lead Channel Impedance Value: 396 Ohm
Lead Channel Pacing Threshold Amplitude: 1.5 V
Lead Channel Pacing Threshold Pulse Width: 0.4 ms
Lead Channel Setting Pacing Amplitude: 2.5 V
Lead Channel Setting Pacing Pulse Width: 0.4 ms
Lead Channel Setting Sensing Sensitivity: 0.6 mV
Pulse Gen Serial Number: 190689

## 2019-03-29 SURGERY — BAROREFLEX LEAD INSERTION
Anesthesia: General

## 2019-03-30 NOTE — Progress Notes (Signed)
ICD Remote  

## 2019-04-10 ENCOUNTER — Ambulatory Visit: Payer: Medicaid Other | Admitting: Nurse Practitioner

## 2019-04-20 ENCOUNTER — Other Ambulatory Visit: Payer: Self-pay

## 2019-05-22 ENCOUNTER — Ambulatory Visit (INDEPENDENT_AMBULATORY_CARE_PROVIDER_SITE_OTHER): Payer: Medicaid Other | Admitting: Surgery

## 2019-05-22 ENCOUNTER — Other Ambulatory Visit: Payer: Self-pay

## 2019-05-22 ENCOUNTER — Encounter: Payer: Self-pay | Admitting: Surgery

## 2019-05-22 VITALS — BP 110/76 | HR 83 | Temp 97.2°F | Resp 20 | Ht 66.0 in | Wt 178.0 lb

## 2019-05-22 DIAGNOSIS — Z006 Encounter for examination for normal comparison and control in clinical research program: Secondary | ICD-10-CM

## 2019-05-22 DIAGNOSIS — I5040 Unspecified combined systolic (congestive) and diastolic (congestive) heart failure: Secondary | ICD-10-CM

## 2019-05-22 NOTE — Progress Notes (Signed)
[facility]  Patient name: Stanley [name] MRN: [id number] DOB: 1965/06/19 Sex: male   REQUESTING PROVIDER:    Dr. Rayann Heman   REASON FOR CONSULT:    CHF, Batwire evaluation  HISTORY OF PRESENT ILLNESS:   Stanley [name] is a 54 y.o. male, who is here today to discuss possible treatment for his heart failure.  He has had a ICD in place in 2015.  He suffers from coronary artery disease, status post PCI.  He is on a statin for hypercholesterolemia.  He is medically managed for hypertension.  PAST MEDICAL HISTORY    Past Medical History:  Diagnosis Date  . Acute renal failure (ARF) ([place])   . Acute respiratory failure with hypoxia ([place]) 12/29/2018  . Alcohol abuse   . CAD (coronary artery disease)    a. dx unclear, reported PCI in 2011 but cath 2014 normal coronaries and cardiac CT 07/2016 normal coronaries, no mention of stents.  . Chronic chest pain   . Chronic systolic CHF (congestive heart failure) ([place])   . Depression   . Financial difficulties   . Gout   . Gouty arthritis   . History of noncompliance with medical treatment    financial challenges  . Hypertension   . ICD (implantable cardioverter-defibrillator) in place    [place] SciICD implant done in [place] 2015  . Insomnia   . Lobar pneumonia ([place]) 12/28/2018  . Nonischemic cardiomyopathy ([place])   . Ventricular tachycardia ([place]) 01/2015   2017 - ATP delivered for sinus tach, degenerated into VT for which he received shock. Recurrent VT 03/2018.     FAMILY HISTORY   Family History  Problem Relation Age of Onset  . Cancer Mother        Pancreatic  . Hypertension Father   . Alzheimer's disease Father   . Gout Father   . Dementia Father   . Hypertension Sister   . Clotting disorder Sister   . Obesity Brother   . Bronchitis Brother   . Heart disease Maternal Grandmother   . Gout Paternal Grandfather     SOCIAL HISTORY:   Social History    Socioeconomic History  . Marital status: Divorced    Spouse name: Not on file  . Number of children: 1  . Years of education: 60  . Highest education level: Not on file  Occupational History  . Occupation: Disability  Tobacco Use  . Smoking status: Never Smoker  . Smokeless tobacco: Never Used  Substance and Sexual Activity  . Alcohol use: Yes    Alcohol/week: 0.0 standard drinks  . Drug use: No  . Sexual activity: Not Currently  Other Topics Concern  . Not on file  Social History Narrative   Lives in [place] alone.  Disabled.  Previously worked as a Careers adviser.   Fun: Rest, Read and watch movies.    Social Determinants of Health   Financial Resource Strain:   . Difficulty of Paying Living Expenses:   Food Insecurity:   . Worried About Charity fundraiser in the Last Year:   . Arboriculturist in the Last Year:   Transportation Needs:   . Film/video editor (Medical):   Marland Kitchen Lack of Transportation (Non-Medical):   Physical Activity:   . Days of Exercise per Week:   . Minutes of Exercise per Session:   Stress:   . Feeling of Stress :   Social Connections:   . Frequency of Communication with Friends and  Family:   . Frequency of Social Gatherings with Friends and Family:   . Attends Religious Services:   . Active Member of Clubs or Organizations:   . Attends Archivist Meetings:   Marland Kitchen Marital Status:   Intimate Partner Violence:   . Fear of Current or Ex-Partner:   . Emotionally Abused:   Marland Kitchen Physically Abused:   . Sexually Abused:     ALLERGIES:    Allergies  Allergen Reactions  . Lisinopril Shortness Of Breath  . Apple Swelling    Lip Swelling  . Other Swelling    Nuts - Lip Swelling  . Shellfish Allergy Swelling    Lip Swelling    CURRENT MEDICATIONS:    Current Outpatient Medications  Medication Sig Dispense Refill  . amiodarone (PACERONE) 200 MG tablet Take 1 tablet (200 mg total) by mouth daily. 90 tablet 3  .  atorvastatin (LIPITOR) 40 MG tablet Take 1 tablet (40 mg total) by mouth daily. 90 tablet 3  . busPIRone (BUSPAR) 10 MG tablet Take 1 tablet (10 mg total) by mouth daily. 60 tablet 1  . carvedilol (COREG) 25 MG tablet Take 1 tablet (25 mg total) by mouth 2 (two) times daily with a meal. 180 tablet 3  . digoxin (LANOXIN) 0.125 MG tablet Take 1 tablet (0.125 mg total) by mouth daily. 90 tablet 3  . furosemide (LASIX) 80 MG tablet Take 1 tablet (80 mg total) by mouth See admin instructions. Take 1 tablet (80 mg total) by mouth every Monday, Wednesday, and Friday. May also take 1 tablet (80 mg total) as needed for swelling. 60 tablet 2  . isosorbide-hydrALAZINE (BIDIL) 20-37.5 MG tablet Take 1 tablet by mouth 3 (three) times daily. 90 tablet 3  . losartan (COZAAR) 100 MG tablet Take 1 tablet (100 mg total) by mouth daily. 30 tablet 6  . nitroGLYCERIN (NITROSTAT) 0.4 MG SL tablet Place 1 tablet (0.4 mg total) under the tongue every 5 (five) minutes x 3 doses as needed for chest pain. 30 tablet 6  . potassium chloride SA (KLOR-CON) 20 MEQ tablet Take 1 tablet (20 mEq total) by mouth daily. 90 tablet 3  . spironolactone (ALDACTONE) 25 MG tablet Take 0.5 tablets (12.5 mg total) by mouth daily. 30 tablet 2  . tamsulosin (FLOMAX) 0.4 MG CAPS capsule Take 1 capsule (0.4 mg total) by mouth daily. 30 capsule 3   No current facility-administered medications for this visit.    REVIEW OF SYSTEMS:   [X]  denotes positive finding, [ ]  denotes negative finding Cardiac  Comments:  Chest pain or chest pressure: x   Shortness of breath upon exertion: x   Short of breath when lying flat:    Irregular heart rhythm: x       Vascular    Pain in calf, thigh, or hip brought on by ambulation:    Pain in feet at night that wakes you up from your sleep:  x   Blood clot in your veins:    Leg swelling:         Pulmonary    Oxygen at home:    Productive cough:     Wheezing:         Neurologic    Sudden weakness in  arms or legs:     Sudden numbness in arms or legs:     Sudden onset of difficulty speaking or slurred speech:    Temporary loss of vision in one eye:     Problems with  dizziness:         Gastrointestinal    Blood in stool:      Vomited blood:         Genitourinary    Burning when urinating:     Blood in urine:        Psychiatric    Major depression:         Hematologic    Bleeding problems:    Problems with blood clotting too easily:        Skin    Rashes or ulcers:        Constitutional    Fever or chills:     PHYSICAL EXAM:   There were no vitals filed for this visit.  GENERAL: The patient is a well-nourished male, in no acute distress. The vital signs are documented above. CARDIAC: There is a regular rate and rhythm.  VASCULAR: Sonosite was used to evaluate the right carotid artery which appears disease-free.  The bifurcation was located in the mid neck. PULMONARY: Nonlabored respirations MUSCULOSKELETAL: There are no major deformities or cyanosis. NEUROLOGIC: No focal weakness or paresthesias are detected. SKIN: There are no ulcers or rashes noted. PSYCHIATRIC: The patient has a normal affect.  STUDIES:   I reviewed his CT scan with the following findings: Negative neck CTA.  No stenosis, beading, or atheromatous changes.  Carotid duplex: Right Carotid: There is no evidence of stenosis in the right ICA.  Bifurcation         inferior to mandible.   Left Carotid: There is no evidence of stenosis in the left ICA.  Bifurcation        inferior to mandible.   Vertebrals: Bilateral vertebral arteries demonstrate antegrade flow.  Subclavians: Normal flow hemodynamics were seen in bilateral subclavian        arteries.   ASSESSMENT and PLAN   I discussed that I think the patient would be a good candidate for either Batwire or Barostim implant.  I discussed the 2 procedures in detail and the discrepancies.  Further decisions will be made  based off of protocols.  Hopefully we can get him a device in the immediate future.  He is eager to proceed.   Leia Alf, MD, FACS [facility] [phone] Pager [phone]

## 2019-05-26 ENCOUNTER — Telehealth: Payer: Self-pay | Admitting: Internal Medicine

## 2019-05-26 NOTE — Telephone Encounter (Signed)
New Message    Pt is calling to speak with someone at the heart failure clinic  Transferred pt

## 2019-05-30 ENCOUNTER — Telehealth: Payer: Self-pay

## 2019-05-30 NOTE — Telephone Encounter (Signed)
The pt wanted to know about his procedure he supposed to have with the research study. I told the pt I was the wrong person and would not know who he supposed to speak with.

## 2019-05-31 ENCOUNTER — Telehealth: Payer: Self-pay | Admitting: *Deleted

## 2019-06-01 NOTE — Telephone Encounter (Signed)
Spoke with patient about barostim. Decided to go commercial route instead of BatWire.  [facility] will contact patient about Barostim implant to schedule date.

## 2019-06-02 ENCOUNTER — Other Ambulatory Visit: Payer: Self-pay

## 2019-06-06 ENCOUNTER — Telehealth: Payer: Self-pay

## 2019-06-06 NOTE — Telephone Encounter (Signed)
Encounter opened in error

## 2019-06-22 ENCOUNTER — Telehealth: Payer: Self-pay | Admitting: Internal Medicine

## 2019-06-22 MED ORDER — FUROSEMIDE 80 MG PO TABS: 80.0000 mg | ORAL_TABLET | ORAL | 2 refills | Status: DC

## 2019-06-22 NOTE — Telephone Encounter (Signed)
*  STAT* If patient is at the pharmacy, call can be transferred to refill team.   1. Which medications need to be refilled? (please list name of each medication and dose if known) Furosemide*  2. Which pharmacy/location (including street and city if local pharmacy) is medication to be sent to? [place], [place]  3. Do they need a 30 day or 90 day supply?  60 days

## 2019-06-22 NOTE — Telephone Encounter (Signed)
Pt calling requesting a refill on Furosemide. Would Dr. Rayann Heman like to refill this medication?: please address

## 2019-06-22 NOTE — Telephone Encounter (Signed)
Refill sent as requested.  OK per AS

## 2019-06-28 ENCOUNTER — Ambulatory Visit (INDEPENDENT_AMBULATORY_CARE_PROVIDER_SITE_OTHER): Payer: Medicaid Other | Admitting: *Deleted

## 2019-06-28 DIAGNOSIS — I428 Other cardiomyopathies: Secondary | ICD-10-CM | POA: Diagnosis not present

## 2019-06-28 DIAGNOSIS — I5022 Chronic systolic (congestive) heart failure: Secondary | ICD-10-CM

## 2019-06-28 LAB — CUP PACEART REMOTE DEVICE CHECK
Battery Remaining Longevity: 114 mo
Battery Remaining Percentage: 100 %
Brady Statistic RV Percent Paced: 0 %
Date Time Interrogation Session: 20210623065000
HighPow Impedance: 51 Ohm
Implantable Lead Implant Date: 20150601
Implantable Lead Location: 753860
Implantable Lead Model: 295
Implantable Lead Serial Number: 134196
Implantable Pulse Generator Implant Date: 20150601
Lead Channel Impedance Value: 411 Ohm
Lead Channel Pacing Threshold Amplitude: 1.5 V
Lead Channel Pacing Threshold Pulse Width: 0.4 ms
Lead Channel Setting Pacing Amplitude: 2.5 V
Lead Channel Setting Pacing Pulse Width: 0.4 ms
Lead Channel Setting Sensing Sensitivity: 0.6 mV
Pulse Gen Serial Number: 190689

## 2019-06-29 NOTE — Progress Notes (Signed)
Remote ICD transmission.   

## 2019-07-05 ENCOUNTER — Encounter (HOSPITAL_COMMUNITY)
Admission: RE | Admit: 2019-07-05 | Discharge: 2019-07-05 | Disposition: A | Discharge status: Home / Self Care | Payer: Medicaid Other | Source: Ambulatory Visit | Attending: Surgery | Admitting: Surgery

## 2019-07-05 ENCOUNTER — Encounter (HOSPITAL_COMMUNITY): Payer: Self-pay

## 2019-07-05 ENCOUNTER — Other Ambulatory Visit (HOSPITAL_COMMUNITY)
Admission: RE | Admit: 2019-07-05 | Discharge: 2019-07-05 | Disposition: A | Discharge status: Home / Self Care | Payer: Medicaid Other | Source: Ambulatory Visit | Attending: Surgery | Admitting: Surgery

## 2019-07-05 ENCOUNTER — Encounter: Payer: Self-pay | Admitting: Internal Medicine

## 2019-07-05 ENCOUNTER — Other Ambulatory Visit: Payer: Self-pay

## 2019-07-05 ENCOUNTER — Other Ambulatory Visit (HOSPITAL_COMMUNITY): Payer: Medicaid Other

## 2019-07-05 DIAGNOSIS — Z20822 Contact with and (suspected) exposure to covid-19: Secondary | ICD-10-CM | POA: Insufficient documentation

## 2019-07-05 DIAGNOSIS — Z01812 Encounter for preprocedural laboratory examination: Secondary | ICD-10-CM | POA: Diagnosis not present

## 2019-07-05 HISTORY — DX: Anxiety disorder, unspecified: F41.9

## 2019-07-05 HISTORY — DX: Sleep apnea, unspecified: G47.30

## 2019-07-05 HISTORY — DX: Headache, unspecified: R51.9

## 2019-07-05 HISTORY — DX: Dyspnea, unspecified: R06.00

## 2019-07-05 LAB — URINALYSIS, ROUTINE W REFLEX MICROSCOPIC
Bilirubin Urine: NEGATIVE
Glucose, UA: NEGATIVE mg/dL
Hgb urine dipstick: NEGATIVE
Ketones, ur: NEGATIVE mg/dL
Leukocytes,Ua: NEGATIVE
Nitrite: NEGATIVE
Protein, ur: NEGATIVE mg/dL
Specific Gravity, Urine: 1.015 (ref 1.005–1.030)
pH: 6 (ref 5.0–8.0)

## 2019-07-05 LAB — COMPREHENSIVE METABOLIC PANEL
ALT: 30 U/L (ref 0–44)
AST: 34 U/L (ref 15–41)
Albumin: 3.8 g/dL (ref 3.5–5.0)
Alkaline Phosphatase: 61 U/L (ref 38–126)
Anion gap: 9 (ref 5–15)
BUN: 14 mg/dL (ref 6–20)
CO2: 25 mmol/L (ref 22–32)
Calcium: 9.2 mg/dL (ref 8.9–10.3)
Chloride: 104 mmol/L (ref 98–111)
Creatinine, Ser: 1.12 mg/dL (ref 0.61–1.24)
GFR calc Af Amer: >60 mL/min (ref >60)
GFR calc non Af Amer: >60 mL/min (ref >60)
Glucose, Bld: 117 mg/dL — ABNORMAL HIGH (ref 70–99)
Potassium: 3 mmol/L — ABNORMAL LOW (ref 3.5–5.1)
Sodium: 138 mmol/L (ref 135–145)
Total Bilirubin: 1 mg/dL (ref 0.3–1.2)
Total Protein: 8.1 g/dL (ref 6.5–8.1)

## 2019-07-05 LAB — APTT: aPTT: 29 seconds (ref 24–36)

## 2019-07-05 LAB — CBC
HCT: 41.7 % (ref 39.0–52.0)
Hemoglobin: 13.5 g/dL (ref 13.0–17.0)
MCH: 33.7 pg (ref 26.0–34.0)
MCHC: 32.4 g/dL (ref 30.0–36.0)
MCV: 104 fL — ABNORMAL HIGH (ref 80.0–100.0)
Platelets: 294 10*3/uL (ref 150–400)
RBC: 4.01 MIL/uL — ABNORMAL LOW (ref 4.22–5.81)
RDW: 12.8 % (ref 11.5–15.5)
WBC: 12.6 10*3/uL — ABNORMAL HIGH (ref 4.0–10.5)
nRBC: 0.2 % (ref 0.0–0.2)

## 2019-07-05 LAB — SURGICAL PCR SCREEN
MRSA, PCR: NEGATIVE
Staphylococcus aureus: NEGATIVE

## 2019-07-05 LAB — PROTIME-INR
INR: 1 (ref 0.8–1.2)
Prothrombin Time: 12.3 seconds (ref 11.4–15.2)

## 2019-07-05 LAB — NO BLOOD PRODUCTS

## 2019-07-05 LAB — SARS CORONAVIRUS 2 (TAT 6-24 HRS): SARS Coronavirus 2: NEGATIVE

## 2019-07-05 NOTE — Progress Notes (Signed)
[id number] [address], [place] [address] Phone: [phone] Fax: [phone]  [facility] [place], [place] - [address] [place] [address] Phone: [phone] Fax: [phone]    Your procedure is scheduled on Friday, July 2nd.  Report to [facility] Main Entrance "A" at 5:30 A.M., and check in at the Admitting office.  Call this number if you have problems the morning of surgery:  [phone]  Call [phone] if you have any questions prior to your surgery date Monday-Friday 8am-4pm   Remember:  Do not eat or drink after midnight the night before your surgery    Take these medicines the morning of surgery with A SIP OF WATER  amiodarone (PACERONE)  carvedilol (COREG)  digoxin (LANOXIN) isosorbide-hydrALAZINE (BIDIL) tamsulosin (FLOMAX)  If needed - nitroGLYCERIN (NITROSTAT),sodium chloride (OCEAN)/nasal spray   As of today, STOP taking any Aspirin (unless otherwise instructed by your surgeon) and Aspirin containing products, Aleve, Naproxen, Ibuprofen, Motrin, Advil, Goody's, BC's, all herbal medications, fish oil, and all vitamins.             Do not wear jewelry.            Do not wear lotions, powders,colognes, or deodorant.            Men may shave face and neck.            Do not bring valuables to the hospital.            [facility] is not responsible for any belongings or valuables.  Do NOT Smoke (Tobacco/Vapping) or drink Alcohol 24 hours prior to your procedure If you use a CPAP at night, you may bring all equipment for your overnight stay.   Contacts, glasses, dentures or bridgework may not be worn into surgery.      For patients admitted to the hospital, discharge time will be determined by your treatment team.   Patients discharged the day of surgery will not be allowed to drive home, and  someone needs to stay with them for 24 hours.  Special instructions:   [facility]- Preparing For Surgery  Before surgery, you can play an important role. Because skin is not sterile, your skin needs to be as free of germs as possible. You can reduce the number of germs on your skin by washing with CHG (chlorahexidine gluconate) Soap before surgery.  CHG is an antiseptic cleaner which kills germs and bonds with the skin to continue killing germs even after washing.    Oral Hygiene is also important to reduce your risk of infection.  Remember - BRUSH YOUR TEETH THE MORNING OF SURGERY WITH YOUR REGULAR TOOTHPASTE  Please do not use if you have an allergy to CHG or antibacterial soaps. If your skin becomes reddened/irritated stop using the CHG.  Do not shave (including legs and underarms) for at least 48 hours prior to first CHG shower. It is OK to shave your face.  Please follow these instructions carefully.   1. Shower the NIGHT BEFORE SURGERY and the MORNING OF SURGERY with CHG Soap.   2. If you chose to wash your hair, wash your hair first as usual with your normal shampoo.  3. After you shampoo, rinse your hair and body thoroughly to remove the shampoo.  4. Use CHG as you would any other liquid soap.  You can apply CHG directly to the skin and wash gently with a scrungie or a clean washcloth.   5. Apply the CHG Soap to your body ONLY FROM THE NECK DOWN.  Do not use on open wounds or open sores. Avoid contact with your eyes, ears, mouth and genitals (private parts). Wash Face and genitals (private parts)  with your normal soap.   6. Wash thoroughly, paying special attention to the area where your surgery will be performed.  7. Thoroughly rinse your body with warm water from the neck down.  8. DO NOT shower/wash with your normal soap after using and rinsing off the CHG Soap.  9. Pat yourself dry with a CLEAN TOWEL.  10. Wear CLEAN PAJAMAS to bed the night before surgery, wear  comfortable clothes the morning of surgery  11. Place CLEAN SHEETS on your bed the night of your first shower and DO NOT SLEEP WITH PETS.  Day of Surgery: Shower with CHG soap as instructed above.  Do not apply any deodorants/lotions.  Please wear clean clothes to the hospital/surgery center.   Remember to brush your teeth WITH YOUR REGULAR TOOTHPASTE.   Please read over the following fact sheets that you were given.

## 2019-07-05 NOTE — Progress Notes (Signed)
Abnormal labs have resulted, Dr. Trula Thuman notified via [place].

## 2019-07-05 NOTE — Progress Notes (Addendum)
PCP - [name], NP Cardiologist - ; Dr. Saundra Shelling  Electrophysiologist - Dr. Thompson Grayer  PPM/ICD - ICD Device Orders - in chart and in epic Encounters tab Rep Notified - Yes, Joey of [place]  Chest x-ray - N/A EKG - 03/15/2019 Stress Test - 03/10/2019 ECHO - 09/19/2018 Cardiac Cath - denies  Sleep Study - per patient it was done sometime last year CPAP - per patient recently got a CPAP machine few days ago, wears it sometimes  DM: denies  Blood Thinner Instructions: N/A Aspirin Instructions: N/A  ERAS Protcol - No  COVID TEST- Scheduled for today 07/05/2019 after PAT appointment. Patient verbalized understanding of self-quarantine instructions, appointment time and place.  Anesthesia review: YES, cardiac history  Patient denies shortness of breath, fever, cough or chest pain at PAT appointment  All instructions explained to the patient, with a verbal understanding of the material. Patient agrees to go over the instructions while at home for a better understanding. Patient also instructed to self quarantine after being tested for COVID-19. The opportunity to ask questions was provided.

## 2019-07-05 NOTE — Progress Notes (Addendum)
[facility] DEVICE PROGRAMMING  Patient Information: Name:  Stanley [name]  DOB:  27-Sep-1965  MRN:  [id number]   Planned Procedure: Barostim Insertion  Surgeon: Harold Barban  Date of Procedure: 07/07/19  Cautery will be used.  Position during surgery: supine   Please send documentation back to:  [facility] (Fax # [phone])   Device Information:  Clinic EP Physician:  Thompson Grayer, MD  Device Type:  Defibrillator Manufacturer and Phone #:  [employer]: [phone] Pacemaker Dependent?:  No. Date of Last Device Check:  06/28/2019 Normal Device Function?:  Yes.    Electrophysiologist's Recommendations:   Have magnet available.  Provide continuous ECG monitoring when magnet is used or reprogramming is to be performed.   Procedure will likely interfere with device function.  Please have Public librarian present and program tachy therapies off during procedure.  Per Device Clinic [address] [place], [place]  12:30 PM 07/05/2019

## 2019-07-05 NOTE — Progress Notes (Signed)
Joey of [employer] made aware via phone call of patient's upcoming procedure, time and place.

## 2019-07-06 NOTE — Anesthesia Preprocedure Evaluation (Addendum)
Anesthesia Evaluation  Patient identified by MRN, date of birth, ID band Patient awake    Reviewed: Allergy & Precautions, NPO status , Patient's Chart, lab work & pertinent test results  History of Anesthesia Complications Negative for: history of anesthetic complications  Airway Mallampati: III  TM Distance: >3 FB Neck ROM: Full    Dental  (+) Dental Advisory Given   Pulmonary shortness of breath, sleep apnea ,    breath sounds clear to auscultation       Cardiovascular hypertension, Pt. on medications + CAD, + Past MI and +CHF   Rhythm:Regular  2020 stress:  Conclusion: Exercise testing with gas exchange demonstrates moderate to severely reduced functional capacity when compared to matched sedentary norms. Pre-exercise spirometry demonstrates moderate to severe restrictive properties, likely related to his body habitus. EKG noted much ectopy. There was a hypertensive response to exercise and blunted O2 pulse, suggesting possible ischemic properties.  Patient has multi-factorial limitations, primarily circulatory in nature with deconditioning.   2021 tte:  1. Left ventricular ejection fraction, by estimation, is 25 to 30%. The  left ventricle has severely decreased function. The left ventricle  demonstrates global hypokinesis. The left ventricular internal cavity size  was mildly dilated. Left ventricular  diastolic parameters are consistent with Grade I diastolic dysfunction  (impaired relaxation). Elevated left atrial pressure.  2. Right ventricular systolic function is normal. The right ventricular  size is normal. There is mildly elevated pulmonary artery systolic  pressure. The estimated right ventricular systolic pressure is 48.2 mmHg.  3. The mitral valve is normal in structure and function. Mild mitral  valve regurgitation. No evidence of mitral stenosis.  4. The aortic valve is normal in structure and function.  Aortic valve  regurgitation is not visualized. No aortic stenosis is present.  5. The inferior vena cava is normal in size with greater than 50%  respiratory variability, suggesting right atrial pressure of 3 mmHg.    Neuro/Psych  Headaches, PSYCHIATRIC DISORDERS Anxiety Depression    GI/Hepatic negative GI ROS, Neg liver ROS,   Endo/Other  negative endocrine ROS  Renal/GU Renal disease     Musculoskeletal  (+) Arthritis ,   Abdominal   Peds  Hematology negative hematology ROS (+)   Anesthesia Other Findings   Reproductive/Obstetrics                           Anesthesia Physical Anesthesia Plan  ASA: III  Anesthesia Plan: General   Post-op Pain Management:    Induction: Intravenous  PONV Risk Score and Plan: 2 and Ondansetron and Dexamethasone  Airway Management Planned: Oral ETT  Additional Equipment: Arterial line  Intra-op Plan:   Post-operative Plan: Extubation in OR  Informed Consent: I have reviewed the patients History and Physical, chart, labs and discussed the procedure including the risks, benefits and alternatives for the proposed anesthesia with the patient or authorized representative who has indicated his/her understanding and acceptance.     Dental advisory given  Plan Discussed with: CRNA and Surgeon  Anesthesia Plan Comments: (PAT note by Karoline Caldwell, PA-C: Follows with cardiology for hx of hx of NICM in 2006 (felt 2/2 HTN/ETOH), ? CAD s/p PCI to unknown vessel 2011 at outside location (cath 2014 without CAD and normal coronaries by CT 07/2016)eventually a[employer] SciICD implant done in [place] 2015, [place] 2017 (maintained on amiodarone),noncompliance, chronic chest pain of noncardiac source. Last seen by Dr. [name] 03/15/19, doing well from CV standpoint, stable DOE.  Noted to be stable NYHA III, volume status ok on lasix WMF. No medication changes made. Discussed upcoming batwire vs barostim implant to be done by vascular  surgery.   OSA on CPAP.   Preop labs reviewed, hypokalemia K+ 3.0. Will recheck istat on DOS.    Periop device orders per progress note 07/05/19: Device Information:  Clinic EP Physician:  Thompson Grayer, MD  Device Type:  Defibrillator Manufacturer and Phone #:  [employer]: [phone] Pacemaker Dependent?:  No. Date of Last Device Check:  06/28/2019       Normal Device Function?:  Yes.    Electrophysiologist's Recommendations:  Have magnet available. Provide continuous ECG monitoring when magnet is used or reprogramming is to be performed.  Procedure will likely interfere with device function.  Please have Public librarian present and program tachy therapies off during procedure.  ECG 03/15/19: NSR 89 IVCD 15ms LVH with non-specific ST abnormalities   CTA neck 03/15/19: IMPRESSION: Negative neck CTA.  No stenosis, beading, or atheromatous changes.  TTE 03/10/19: 1. Left ventricular ejection fraction, by estimation, is 25 to 30%. The  left ventricle has severely decreased function. The left ventricle  demonstrates global hypokinesis. The left ventricular internal cavity size  was mildly dilated. Left ventricular  diastolic parameters are consistent with Grade I diastolic dysfunction  (impaired relaxation). Elevated left atrial pressure.  2. Right ventricular systolic function is normal. The right ventricular  size is normal. There is mildly elevated pulmonary artery systolic  pressure. The estimated right ventricular systolic pressure is 02.3 mmHg.  3. The mitral valve is normal in structure and function. Mild mitral  valve regurgitation. No evidence of mitral stenosis.  4. The aortic valve is normal in structure and function. Aortic valve  regurgitation is not visualized. No aortic stenosis is present.  5. The inferior vena cava is normal in size with greater than 50%  respiratory variability, suggesting right atrial pressure of 3 mmHg.    Cardiopulmonary exercise test 09/19/18: FVC 1.96 (57%)    FEV1 1.60 (58%)     FEV1/FVC 81 (101%)     MVV 80 (61%)      Resting HR: 77 Peak HR: 131  (79% age predicted max HR)  BP rest: 168/80     Standing BP: 156/74 BP peak: 196/80   Peak VO2: 14.9 (52% predicted peak VO2)  VE/VCO2 slope: 31  OUES: 1.43  Peak RER: 1.05  VE/MVV: 50% O2pulse: 10  (67% predicted O2pulse)   Moderate to severe HF limitation but normal VeVCO2 slope is reassuring. Restrictive lung physiology.   )       Anesthesia Quick Evaluation

## 2019-07-06 NOTE — Progress Notes (Signed)
Anesthesia Chart Review:  Follows with cardiology for hx of hx of NICM in 2006 (felt 2/2 HTN/ETOH), ? CAD s/p PCI to unknown vessel 2011 at outside location (cath 2014 without CAD and normal coronaries by CT 07/2016)eventually a[employer] SciICD implant done in [place] 2015, VT 2017 (maintained on amiodarone),noncompliance, chronic chest pain of noncardiac source. Last seen by Dr. [name] 03/15/19, doing well from CV standpoint, stable DOE. Noted to be stable NYHA III, volume status ok on lasix WMF. No medication changes made. Discussed upcoming batwire vs barostim implant to be done by vascular surgery.   OSA on CPAP.   Preop labs reviewed, hypokalemia K+ 3.0. Will recheck istat on DOS.    Periop device orders per progress note 07/05/19: Device Information:  Clinic EP Physician:  Thompson Grayer, MD  Device Type:  Defibrillator Manufacturer and Phone #:  [employer]: [phone] Pacemaker Dependent?:  No. Date of Last Device Check:  06/28/2019       Normal Device Function?:  Yes.    Electrophysiologist's Recommendations:   Have magnet available.  Provide continuous ECG monitoring when magnet is used or reprogramming is to be performed.   Procedure will likely interfere with device function.  Please have Public librarian present and program tachy therapies off during procedure.  ECG 03/15/19: NSR 89 IVCD 138ms LVH with non-specific ST abnormalities   CTA neck 03/15/19: IMPRESSION: Negative neck CTA.  No stenosis, beading, or atheromatous changes.  TTE 03/10/19: 1. Left ventricular ejection fraction, by estimation, is 25 to 30%. The  left ventricle has severely decreased function. The left ventricle  demonstrates global hypokinesis. The left ventricular internal cavity size  was mildly dilated. Left ventricular  diastolic parameters are consistent with Grade I diastolic dysfunction  (impaired relaxation). Elevated left atrial pressure.  2. Right ventricular  systolic function is normal. The right ventricular  size is normal. There is mildly elevated pulmonary artery systolic  pressure. The estimated right ventricular systolic pressure is 38.2 mmHg.  3. The mitral valve is normal in structure and function. Mild mitral  valve regurgitation. No evidence of mitral stenosis.  4. The aortic valve is normal in structure and function. Aortic valve  regurgitation is not visualized. No aortic stenosis is present.  5. The inferior vena cava is normal in size with greater than 50%  respiratory variability, suggesting right atrial pressure of 3 mmHg.   Cardiopulmonary exercise test 09/19/18: FVC 1.96 (57%)    FEV1 1.60 (58%)     FEV1/FVC 81 (101%)     MVV 80 (61%)      Resting HR: 77 Peak HR: 131  (79% age predicted max HR)  BP rest: 168/80     Standing BP: 156/74 BP peak: 196/80   Peak VO2: 14.9 (52% predicted peak VO2)  VE/VCO2 slope: 31  OUES: 1.43  Peak RER: 1.05  VE/MVV: 50% O2pulse: 10  (67% predicted O2pulse)   Moderate to severe HF limitation but normal VeVCO2 slope is reassuring. Restrictive lung physiology.    Wynonia Musty [facility] Short Stay Center/Anesthesiology Phone [phone] 07/06/2019 10:12 AM

## 2019-07-07 ENCOUNTER — Inpatient Hospital Stay (HOSPITAL_COMMUNITY): Payer: Medicaid Other | Admitting: Physician Assistant

## 2019-07-07 ENCOUNTER — Observation Stay (HOSPITAL_COMMUNITY)
Admission: RE | Admit: 2019-07-07 | Discharge: 2019-07-08 | Disposition: A | Discharge status: Home / Self Care | Payer: Medicaid Other | Attending: Surgery | Admitting: Surgery

## 2019-07-07 ENCOUNTER — Encounter (HOSPITAL_COMMUNITY)
Admission: RE | Disposition: A | Discharge status: Home / Self Care | Payer: Self-pay | Source: Home / Self Care | Attending: Surgery

## 2019-07-07 ENCOUNTER — Other Ambulatory Visit: Payer: Self-pay

## 2019-07-07 DIAGNOSIS — Z9861 Coronary angioplasty status: Secondary | ICD-10-CM | POA: Diagnosis not present

## 2019-07-07 DIAGNOSIS — I252 Old myocardial infarction: Secondary | ICD-10-CM | POA: Diagnosis not present

## 2019-07-07 DIAGNOSIS — I509 Heart failure, unspecified: Secondary | ICD-10-CM

## 2019-07-07 DIAGNOSIS — Z91013 Allergy to seafood: Secondary | ICD-10-CM | POA: Insufficient documentation

## 2019-07-07 DIAGNOSIS — E7800 Pure hypercholesterolemia, unspecified: Secondary | ICD-10-CM | POA: Insufficient documentation

## 2019-07-07 DIAGNOSIS — Z79899 Other long term (current) drug therapy: Secondary | ICD-10-CM | POA: Diagnosis not present

## 2019-07-07 DIAGNOSIS — Z91018 Allergy to other foods: Secondary | ICD-10-CM | POA: Insufficient documentation

## 2019-07-07 DIAGNOSIS — I5022 Chronic systolic (congestive) heart failure: Secondary | ICD-10-CM | POA: Insufficient documentation

## 2019-07-07 DIAGNOSIS — G4730 Sleep apnea, unspecified: Secondary | ICD-10-CM | POA: Insufficient documentation

## 2019-07-07 DIAGNOSIS — Z8249 Family history of ischemic heart disease and other diseases of the circulatory system: Secondary | ICD-10-CM | POA: Diagnosis not present

## 2019-07-07 DIAGNOSIS — Z888 Allergy status to other drugs, medicaments and biological substances status: Secondary | ICD-10-CM | POA: Insufficient documentation

## 2019-07-07 DIAGNOSIS — F419 Anxiety disorder, unspecified: Secondary | ICD-10-CM | POA: Insufficient documentation

## 2019-07-07 DIAGNOSIS — I110 Hypertensive heart disease with heart failure: Secondary | ICD-10-CM | POA: Insufficient documentation

## 2019-07-07 DIAGNOSIS — N289 Disorder of kidney and ureter, unspecified: Secondary | ICD-10-CM | POA: Diagnosis not present

## 2019-07-07 DIAGNOSIS — I2510 Atherosclerotic heart disease of native coronary artery without angina pectoris: Secondary | ICD-10-CM | POA: Diagnosis not present

## 2019-07-07 DIAGNOSIS — Z95810 Presence of automatic (implantable) cardiac defibrillator: Secondary | ICD-10-CM | POA: Insufficient documentation

## 2019-07-07 LAB — POCT I-STAT, CHEM 8
BUN: 21 mg/dL — ABNORMAL HIGH (ref 6–20)
Calcium, Ion: 1.24 mmol/L (ref 1.15–1.40)
Chloride: 105 mmol/L (ref 98–111)
Creatinine, Ser: 1.3 mg/dL — ABNORMAL HIGH (ref 0.61–1.24)
Glucose, Bld: 104 mg/dL — ABNORMAL HIGH (ref 70–99)
HCT: 42 % (ref 39.0–52.0)
Hemoglobin: 14.3 g/dL (ref 13.0–17.0)
Potassium: 3 mmol/L — ABNORMAL LOW (ref 3.5–5.1)
Sodium: 140 mmol/L (ref 135–145)
TCO2: 23 mmol/L (ref 22–32)

## 2019-07-07 LAB — CBC
HCT: 37.5 % — ABNORMAL LOW (ref 39.0–52.0)
Hemoglobin: 12.2 g/dL — ABNORMAL LOW (ref 13.0–17.0)
MCH: 33.3 pg (ref 26.0–34.0)
MCHC: 32.5 g/dL (ref 30.0–36.0)
MCV: 102.5 fL — ABNORMAL HIGH (ref 80.0–100.0)
Platelets: 274 10*3/uL (ref 150–400)
RBC: 3.66 MIL/uL — ABNORMAL LOW (ref 4.22–5.81)
RDW: 12.4 % (ref 11.5–15.5)
WBC: 13.2 10*3/uL — ABNORMAL HIGH (ref 4.0–10.5)
nRBC: 0 % (ref 0.0–0.2)

## 2019-07-07 LAB — CREATININE, SERUM
Creatinine, Ser: 1.12 mg/dL (ref 0.61–1.24)
GFR calc Af Amer: >60 mL/min (ref >60)
GFR calc non Af Amer: >60 mL/min (ref >60)

## 2019-07-07 SURGERY — INSERTION, CAROTID SINUS BAROREFLEX ACTIVATION DEVICE
Anesthesia: General | Site: Neck | Laterality: Right

## 2019-07-07 MED ORDER — ETOMIDATE 2 MG/ML IV SOLN
INTRAVENOUS | Status: AC
  Filled 2019-07-07: qty 10

## 2019-07-07 MED ORDER — AMISULPRIDE (ANTIEMETIC) 5 MG/2ML IV SOLN: 5.0000 mg | Freq: Once | INTRAVENOUS | Status: DC

## 2019-07-07 MED ORDER — SODIUM CHLORIDE 0.9% FLUSH: 3.0000 mL | INTRAVENOUS | Status: DC | PRN

## 2019-07-07 MED ORDER — CARVEDILOL 25 MG PO TABS
25.0000 mg | ORAL_TABLET | Freq: Two times a day (BID) | ORAL | Status: DC
  Administered 2019-07-07 – 2019-07-08 (×2): 25 mg via ORAL
  Filled 2019-07-07 (×2): qty 1

## 2019-07-07 MED ORDER — METOPROLOL TARTRATE 5 MG/5ML IV SOLN: 2.0000 mg | INTRAVENOUS | Status: DC | PRN

## 2019-07-07 MED ORDER — DEXAMETHASONE SODIUM PHOSPHATE 10 MG/ML IJ SOLN
INTRAMUSCULAR | Status: AC
  Filled 2019-07-07: qty 1

## 2019-07-07 MED ORDER — SUGAMMADEX SODIUM 200 MG/2ML IV SOLN
INTRAVENOUS | Status: DC | PRN
  Administered 2019-07-07: 200 mg via INTRAVENOUS

## 2019-07-07 MED ORDER — GUAIFENESIN-DM 100-10 MG/5ML PO SYRP: 15.0000 mL | ORAL_SOLUTION | ORAL | Status: DC | PRN

## 2019-07-07 MED ORDER — DEXAMETHASONE SODIUM PHOSPHATE 10 MG/ML IJ SOLN
INTRAMUSCULAR | Status: DC | PRN
  Administered 2019-07-07: 5 mg via INTRAVENOUS

## 2019-07-07 MED ORDER — ONDANSETRON HCL 4 MG/2ML IJ SOLN
INTRAMUSCULAR | Status: AC
  Filled 2019-07-07: qty 2

## 2019-07-07 MED ORDER — MIDAZOLAM HCL 2 MG/2ML IJ SOLN
INTRAMUSCULAR | Status: AC
  Filled 2019-07-07: qty 2

## 2019-07-07 MED ORDER — FENTANYL CITRATE (PF) 100 MCG/2ML IJ SOLN
INTRAMUSCULAR | Status: AC
  Filled 2019-07-07: qty 2

## 2019-07-07 MED ORDER — OXYCODONE HCL 5 MG/5ML PO SOLN: 5.0000 mg | Freq: Once | ORAL | Status: DC | PRN

## 2019-07-07 MED ORDER — ETOMIDATE 2 MG/ML IV SOLN
INTRAVENOUS | Status: DC | PRN
Start: 2019-07-07 — End: 2019-07-07
  Administered 2019-07-07: 8 mg via INTRAVENOUS
  Administered 2019-07-07: 12 mg via INTRAVENOUS

## 2019-07-07 MED ORDER — FENTANYL CITRATE (PF) 100 MCG/2ML IJ SOLN
25.0000 ug | INTRAMUSCULAR | Status: DC | PRN
  Administered 2019-07-07 (×3): 50 ug via INTRAVENOUS

## 2019-07-07 MED ORDER — AMISULPRIDE (ANTIEMETIC) 5 MG/2ML IV SOLN
10.0000 mg | Freq: Once | INTRAVENOUS | Status: AC
  Administered 2019-07-07: 10 mg via INTRAVENOUS

## 2019-07-07 MED ORDER — ROCURONIUM BROMIDE 10 MG/ML (PF) SYRINGE
PREFILLED_SYRINGE | INTRAVENOUS | Status: DC | PRN
  Administered 2019-07-07: 60 mg via INTRAVENOUS

## 2019-07-07 MED ORDER — POTASSIUM CHLORIDE CRYS ER 20 MEQ PO TBCR
20.0000 meq | EXTENDED_RELEASE_TABLET | Freq: Every day | ORAL | Status: DC
  Administered 2019-07-07 – 2019-07-08 (×2): 20 meq via ORAL
  Filled 2019-07-07 (×2): qty 1

## 2019-07-07 MED ORDER — CHLORHEXIDINE GLUCONATE CLOTH 2 % EX PADS: 6.0000 | MEDICATED_PAD | Freq: Once | CUTANEOUS | Status: DC

## 2019-07-07 MED ORDER — PHENYLEPHRINE HCL-NACL 10-0.9 MG/250ML-% IV SOLN
INTRAVENOUS | Status: DC | PRN
Start: 2019-07-07 — End: 2019-07-07
  Administered 2019-07-07: 25 ug/min via INTRAVENOUS

## 2019-07-07 MED ORDER — PHENYLEPHRINE HCL (PRESSORS) 10 MG/ML IV SOLN
INTRAVENOUS | Status: AC
  Filled 2019-07-07: qty 1

## 2019-07-07 MED ORDER — ASPIRIN EC 325 MG PO TBEC
325.0000 mg | DELAYED_RELEASE_TABLET | Freq: Every day | ORAL | Status: DC
  Administered 2019-07-08: 325 mg via ORAL
  Filled 2019-07-07: qty 1

## 2019-07-07 MED ORDER — FUROSEMIDE 80 MG PO TABS: 80.0000 mg | ORAL_TABLET | Freq: Every day | ORAL | Status: DC | PRN

## 2019-07-07 MED ORDER — SODIUM CHLORIDE 0.9 % IV SOLN
INTRAVENOUS | Status: AC
  Filled 2019-07-07: qty 500000

## 2019-07-07 MED ORDER — DOCUSATE SODIUM 100 MG PO CAPS
100.0000 mg | ORAL_CAPSULE | Freq: Every day | ORAL | Status: DC
  Filled 2019-07-07: qty 1

## 2019-07-07 MED ORDER — HYDRALAZINE HCL 20 MG/ML IJ SOLN: 5.0000 mg | INTRAMUSCULAR | Status: DC | PRN

## 2019-07-07 MED ORDER — CEFAZOLIN SODIUM-DEXTROSE 2-4 GM/100ML-% IV SOLN
2.0000 g | Freq: Three times a day (TID) | INTRAVENOUS | Status: AC
  Administered 2019-07-07 – 2019-07-08 (×2): 2 g via INTRAVENOUS
  Filled 2019-07-07 (×2): qty 100

## 2019-07-07 MED ORDER — SODIUM CHLORIDE 0.9 % IV SOLN: INTRAVENOUS | Status: DC

## 2019-07-07 MED ORDER — FENTANYL CITRATE (PF) 250 MCG/5ML IJ SOLN
INTRAMUSCULAR | Status: AC
  Filled 2019-07-07: qty 5

## 2019-07-07 MED ORDER — FENTANYL CITRATE (PF) 100 MCG/2ML IJ SOLN: 25.0000 ug | INTRAMUSCULAR | Status: DC | PRN

## 2019-07-07 MED ORDER — MIDAZOLAM HCL 2 MG/2ML IJ SOLN
INTRAMUSCULAR | Status: DC | PRN
  Administered 2019-07-07 (×2): 2 mg via INTRAVENOUS

## 2019-07-07 MED ORDER — SODIUM CHLORIDE 0.9 % IV SOLN: 500.0000 mL | Freq: Once | INTRAVENOUS | Status: DC | PRN

## 2019-07-07 MED ORDER — FENTANYL CITRATE (PF) 250 MCG/5ML IJ SOLN
INTRAMUSCULAR | Status: DC | PRN
  Administered 2019-07-07 (×2): 50 ug via INTRAVENOUS

## 2019-07-07 MED ORDER — TRAMADOL HCL 50 MG PO TABS
50.0000 mg | ORAL_TABLET | Freq: Four times a day (QID) | ORAL | Status: DC | PRN
  Administered 2019-07-07 – 2019-07-08 (×3): 50 mg via ORAL
  Filled 2019-07-07 (×3): qty 1

## 2019-07-07 MED ORDER — ONDANSETRON HCL 4 MG/2ML IJ SOLN
INTRAMUSCULAR | Status: DC | PRN
  Administered 2019-07-07: 4 mg via INTRAVENOUS

## 2019-07-07 MED ORDER — CHLORHEXIDINE GLUCONATE 0.12 % MT SOLN
15.0000 mL | Freq: Once | OROMUCOSAL | Status: AC
  Administered 2019-07-07: 15 mL via OROMUCOSAL
  Filled 2019-07-07: qty 15

## 2019-07-07 MED ORDER — SODIUM CHLORIDE 0.9% FLUSH
3.0000 mL | Freq: Two times a day (BID) | INTRAVENOUS | Status: DC
  Administered 2019-07-07 – 2019-07-08 (×2): 3 mL via INTRAVENOUS

## 2019-07-07 MED ORDER — ACETAMINOPHEN 650 MG RE SUPP: 325.0000 mg | RECTAL | Status: DC | PRN

## 2019-07-07 MED ORDER — ALUM & MAG HYDROXIDE-SIMETH 200-200-20 MG/5ML PO SUSP: 15.0000 mL | ORAL | Status: DC | PRN

## 2019-07-07 MED ORDER — PHENOL 1.4 % MT LIQD: 1.0000 | OROMUCOSAL | Status: DC | PRN

## 2019-07-07 MED ORDER — PANTOPRAZOLE SODIUM 40 MG PO TBEC
40.0000 mg | DELAYED_RELEASE_TABLET | Freq: Every day | ORAL | Status: DC
  Administered 2019-07-07 – 2019-07-08 (×2): 40 mg via ORAL
  Filled 2019-07-07 (×2): qty 1

## 2019-07-07 MED ORDER — HEPARIN SODIUM (PORCINE) 5000 UNIT/ML IJ SOLN
5000.0000 [IU] | Freq: Three times a day (TID) | INTRAMUSCULAR | Status: DC
  Administered 2019-07-07 – 2019-07-08 (×2): 5000 [IU] via SUBCUTANEOUS
  Filled 2019-07-07 (×2): qty 1

## 2019-07-07 MED ORDER — DIGOXIN 125 MCG PO TABS
0.1250 mg | ORAL_TABLET | Freq: Every day | ORAL | Status: DC
  Administered 2019-07-08: 0.125 mg via ORAL
  Filled 2019-07-07: qty 1

## 2019-07-07 MED ORDER — BUPIVACAINE HCL (PF) 0.5 % IJ SOLN
INTRAMUSCULAR | Status: AC
  Filled 2019-07-07: qty 30

## 2019-07-07 MED ORDER — ROCURONIUM BROMIDE 10 MG/ML (PF) SYRINGE
PREFILLED_SYRINGE | INTRAVENOUS | Status: AC
  Filled 2019-07-07: qty 10

## 2019-07-07 MED ORDER — 0.9 % SODIUM CHLORIDE (POUR BTL) OPTIME
TOPICAL | Status: DC | PRN
  Administered 2019-07-07: 1000 mL

## 2019-07-07 MED ORDER — ACETAMINOPHEN 325 MG PO TABS
325.0000 mg | ORAL_TABLET | ORAL | Status: DC | PRN
  Administered 2019-07-07 – 2019-07-08 (×2): 650 mg via ORAL
  Filled 2019-07-07: qty 2

## 2019-07-07 MED ORDER — ISOSORB DINITRATE-HYDRALAZINE 20-37.5 MG PO TABS
1.0000 | ORAL_TABLET | Freq: Three times a day (TID) | ORAL | Status: DC
  Administered 2019-07-07 – 2019-07-08 (×3): 1 via ORAL
  Filled 2019-07-07 (×3): qty 1

## 2019-07-07 MED ORDER — ACETAMINOPHEN 10 MG/ML IV SOLN
INTRAVENOUS | Status: AC
  Filled 2019-07-07: qty 100

## 2019-07-07 MED ORDER — ONDANSETRON HCL 4 MG/2ML IJ SOLN: 4.0000 mg | Freq: Four times a day (QID) | INTRAMUSCULAR | Status: DC | PRN

## 2019-07-07 MED ORDER — LOSARTAN POTASSIUM 50 MG PO TABS
50.0000 mg | ORAL_TABLET | Freq: Every day | ORAL | Status: DC
  Administered 2019-07-08: 50 mg via ORAL
  Filled 2019-07-07: qty 1

## 2019-07-07 MED ORDER — CEFAZOLIN SODIUM-DEXTROSE 2-4 GM/100ML-% IV SOLN
2.0000 g | INTRAVENOUS | Status: AC
  Administered 2019-07-07: 2 mg via INTRAVENOUS
  Filled 2019-07-07: qty 100

## 2019-07-07 MED ORDER — SODIUM CHLORIDE 0.9 % IV SOLN: 250.0000 mL | INTRAVENOUS | Status: DC | PRN

## 2019-07-07 MED ORDER — SODIUM CHLORIDE 0.9 % IV SOLN
INTRAVENOUS | Status: DC | PRN
  Administered 2019-07-07: 500 mL

## 2019-07-07 MED ORDER — FUROSEMIDE 80 MG PO TABS: 80.0000 mg | ORAL_TABLET | ORAL | Status: DC

## 2019-07-07 MED ORDER — ACETAMINOPHEN 10 MG/ML IV SOLN
1000.0000 mg | Freq: Once | INTRAVENOUS | Status: DC | PRN
  Administered 2019-07-07: 1000 mg via INTRAVENOUS

## 2019-07-07 MED ORDER — AMIODARONE HCL 200 MG PO TABS
200.0000 mg | ORAL_TABLET | Freq: Every day | ORAL | Status: DC
  Administered 2019-07-08: 200 mg via ORAL
  Filled 2019-07-07: qty 1

## 2019-07-07 MED ORDER — LACTATED RINGERS IV SOLN: INTRAVENOUS | Status: DC | PRN

## 2019-07-07 MED ORDER — SPIRONOLACTONE 12.5 MG HALF TABLET
12.5000 mg | ORAL_TABLET | Freq: Every day | ORAL | Status: DC | PRN
  Filled 2019-07-07: qty 1

## 2019-07-07 MED ORDER — NITROGLYCERIN 0.4 MG SL SUBL: 0.4000 mg | SUBLINGUAL_TABLET | SUBLINGUAL | Status: DC | PRN

## 2019-07-07 MED ORDER — ORAL CARE MOUTH RINSE: 15.0000 mL | Freq: Once | OROMUCOSAL | Status: AC

## 2019-07-07 MED ORDER — ACETAMINOPHEN 160 MG/5ML PO SOLN: 1000.0000 mg | Freq: Once | ORAL | Status: DC | PRN

## 2019-07-07 MED ORDER — OXYCODONE HCL 5 MG PO TABS: 5.0000 mg | ORAL_TABLET | Freq: Once | ORAL | Status: DC | PRN

## 2019-07-07 MED ORDER — LABETALOL HCL 5 MG/ML IV SOLN: 10.0000 mg | INTRAVENOUS | Status: DC | PRN

## 2019-07-07 MED ORDER — SODIUM CHLORIDE 0.9% FLUSH
INTRAVENOUS | Status: DC | PRN
  Administered 2019-07-07: 5 mL

## 2019-07-07 MED ORDER — ACETAMINOPHEN 500 MG PO TABS: 1000.0000 mg | ORAL_TABLET | Freq: Once | ORAL | Status: DC | PRN

## 2019-07-07 MED ORDER — AMISULPRIDE (ANTIEMETIC) 5 MG/2ML IV SOLN
INTRAVENOUS | Status: AC
  Filled 2019-07-07: qty 4

## 2019-07-07 MED ORDER — MAGNESIUM SULFATE 2 GM/50ML IV SOLN: 2.0000 g | Freq: Every day | INTRAVENOUS | Status: DC | PRN

## 2019-07-07 MED ORDER — LIDOCAINE HCL (PF) 1 % IJ SOLN
INTRAMUSCULAR | Status: AC
  Filled 2019-07-07: qty 5

## 2019-07-07 MED ORDER — SODIUM CHLORIDE 0.9 % IV SOLN
0.0125 ug/kg/min | INTRAVENOUS | Status: DC
  Administered 2019-07-07: .3 ug/kg/min via INTRAVENOUS
  Filled 2019-07-07 (×2): qty 2000

## 2019-07-07 MED ORDER — POTASSIUM CHLORIDE CRYS ER 20 MEQ PO TBCR: 20.0000 meq | EXTENDED_RELEASE_TABLET | Freq: Every day | ORAL | Status: DC | PRN

## 2019-07-07 MED ORDER — LACTATED RINGERS IV SOLN: INTRAVENOUS | Status: DC

## 2019-07-07 SURGICAL SUPPLY — 45 items
ADH SKN CLS APL DERMABOND .7 (GAUZE/BANDAGES/DRESSINGS) ×1
CANISTER SUCT 3000ML PPV (MISCELLANEOUS) ×3 IMPLANT
CLIP VESOCCLUDE MED 6/CT (CLIP) ×2 IMPLANT
CLIP VESOCCLUDE SM WIDE 6/CT (CLIP) IMPLANT
COVER WAND RF STERILE (DRAPES) ×3 IMPLANT
DERMABOND ADVANCED (GAUZE/BANDAGES/DRESSINGS) ×2
DERMABOND ADVANCED .7 DNX12 (GAUZE/BANDAGES/DRESSINGS) ×1 IMPLANT
DRAPE HALF SHEET 40X57 (DRAPES) ×2 IMPLANT
ELECT REM PT RETURN 9FT ADLT (ELECTROSURGICAL) ×3
ELECTRODE REM PT RTRN 9FT ADLT (ELECTROSURGICAL) ×1 IMPLANT
GENERATOR IPG BAROSTIM 2102 (Generator) ×2 IMPLANT
GLOVE BIOGEL PI IND STRL 7.5 (GLOVE) ×1 IMPLANT
GLOVE BIOGEL PI INDICATOR 7.5 (GLOVE) ×2
GLOVE SURG SS PI 7.5 STRL IVOR (GLOVE) ×3 IMPLANT
GOWN STRL REUS W/ TWL LRG LVL3 (GOWN DISPOSABLE) ×3 IMPLANT
GOWN STRL REUS W/ TWL XL LVL3 (GOWN DISPOSABLE) ×1 IMPLANT
GOWN STRL REUS W/TWL LRG LVL3 (GOWN DISPOSABLE) ×9
GOWN STRL REUS W/TWL XL LVL3 (GOWN DISPOSABLE) ×3
HEMOSTAT SNOW SURGICEL 2X4 (HEMOSTASIS) IMPLANT
KIT BASIN OR (CUSTOM PROCEDURE TRAY) ×3 IMPLANT
KIT TURNOVER KIT B (KITS) ×3 IMPLANT
LEAD CAROTID BAROSTIM 1036 (Lead) ×2 IMPLANT
MARKER SKIN DUAL TIP RULER LAB (MISCELLANEOUS) ×3 IMPLANT
NDL 18GX1X1/2 (RX/OR ONLY) (NEEDLE) ×1 IMPLANT
NEEDLE 18GX1X1/2 (RX/OR ONLY) (NEEDLE) ×3 IMPLANT
NS IRRIG 1000ML POUR BTL (IV SOLUTION) ×6 IMPLANT
PACK CAROTID (CUSTOM PROCEDURE TRAY) ×3 IMPLANT
PAD ARMBOARD 7.5X6 YLW CONV (MISCELLANEOUS) ×6 IMPLANT
POSITIONER HEAD DONUT 9IN (MISCELLANEOUS) ×3 IMPLANT
SUT ETHIBOND CT1 BRD #0 30IN (SUTURE) ×6 IMPLANT
SUT ETHILON 3 0 PS 1 (SUTURE) IMPLANT
SUT MNCRL AB 4-0 PS2 18 (SUTURE) ×2 IMPLANT
SUT PROLENE 6 0 BV (SUTURE) ×26 IMPLANT
SUT SILK 0 FSL (SUTURE) IMPLANT
SUT SILK 3 0 SH CR/8 (SUTURE) ×2 IMPLANT
SUT VIC AB 2-0 CT1 27 (SUTURE) ×3
SUT VIC AB 2-0 CT1 TAPERPNT 27 (SUTURE) IMPLANT
SUT VIC AB 3-0 SH 27 (SUTURE) ×6
SUT VIC AB 3-0 SH 27X BRD (SUTURE) ×2 IMPLANT
SUT VIC AB 3-0 X1 27 (SUTURE) ×4 IMPLANT
SUT VICRYL 4-0 PS2 18IN ABS (SUTURE) ×4 IMPLANT
SYR 5ML LL (SYRINGE) ×3 IMPLANT
SYR BULB IRRIG 60ML STRL (SYRINGE) ×3 IMPLANT
TOWEL GREEN STERILE (TOWEL DISPOSABLE) ×3 IMPLANT
WATER STERILE IRR 1000ML POUR (IV SOLUTION) ×3 IMPLANT

## 2019-07-07 NOTE — Op Note (Signed)
    Patient name: Stanley [name] MRN: [id number] DOB: 02-16-1965 Sex: male  07/07/2019 Pre-operative Diagnosis: #3 heart failure Post-operative diagnosis:  Same Surgeon:  Annamarie Major Assistants:  Ivin Booty Procedure:   Barostim Neo device implantatio Anesthesia: General Blood Loss: 50 cc Specimens: None  Findings: The carotid artery was externally rotated approximately 90 degrees.  The device was implanted at position a with a good hemodynamic response  Indications: Patient comes in today for device implantation.  We discussed the risks and benefits of the procedure as well as the details at his preoperative clinic visit.  All questions were answered.  Serial number: [id number]  Model [id number]  Model 2102  Procedure:  The patient was identified in the holding area and taken to [place] [facility]  The patient was then placed supine on the table. general anesthesia was administered.  The patient was prepped and draped in the usual sterile fashion.  A time out was called and antibiotics were administered.  PA assistance was required for suction retraction, and suture closure.  Ultrasound was used to locate the carotid bifurcation.  A 3 cm oblique incision was made in a skin crease.  Cautery was used about subcutaneous tissue and platysma muscle.  The medial edge of the sternocleidomastoid was dissected free and the sternocleidomastoid was retracted laterally.  I then dissected down to the carotid artery.  The facial vein was ligated between silk ties.  The carotid artery was externally rotated.  I had to mobilize the artery so that it could be rotated so that I could see the anterior surface of the carotid bifurcation.  Once I had adequate exposure, the device was prepared on the back table.  An 18-gauge needle was then inserted into the infraclavicular space and infiltrated with saline.  The lead was then grounded and connected to the generator.  The electrode was then placed on the carotid  bifurcation at position A.  We had an excellent hemodynamic response.  2 sutures of 6-0 Prolene were then placed in the device anchoring it to the carotid adventitia.  The device was then retested and we continue to have a good response.  For additional 6-0 Prolene sutures were then placed.  Again we had another good hemodynamic response.  Next, a infraclavicular incision was made.  Cautery was used about subcutaneous tissue down to the pectoral fascia.  A pocket was created for the can.  Once this was done I created a tunnel on the medial side of the incision up to the carotid incision.  The strain relief loop was then sutured to the common carotid artery adventitia on anterior surface.  The lead was then brought through the tunnel and connected to the can.  The incisions were irrigated with antibiotic solution.  The camera was then placed into the pocket and sutured in place with Ethibond suture.  Irrigation was again performed with antibiotic solution.  The lead was placed to the medial side of the can.  The subcutaneous tissue on the infraclavicular incision was closed with 3-0 Vicryl.  4-0 Vicryl was used to close the skin.  The carotid incision was closed by reapproximating the platysma with 3-0 Vicryl and the skin with 4-0 Monocryl.  Dermabond was placed on the incisions.  There were no immediate complications.   Disposition: To PACU stable.   Theotis Burrow, M.D., [facility] [place] Office: [phone] Pager:  [phone]

## 2019-07-07 NOTE — Anesthesia Procedure Notes (Signed)
Arterial Line Insertion Start/End7/02/2019 8:34 AM, 07/07/2019 8:34 AM Performed by: Oleta Mouse, MD  Patient location: OR. Preanesthetic checklist: patient identified, IV checked, site marked, risks and benefits discussed, surgical consent, monitors and equipment checked, pre-op evaluation and timeout performed Lidocaine 1% used for infiltration Left, radial was placed Catheter size: 20 Fr Hand hygiene performed  and maximum sterile barriers used   Attempts: 2 Procedure performed without using ultrasound guided technique. Following insertion, dressing applied and Biopatch. Post procedure assessment: normal and unchanged  Post procedure complications: hemorrhage. Patient tolerated the procedure well with no immediate complications.

## 2019-07-07 NOTE — Progress Notes (Signed)
Pt arrived from unit from PACU. VSS.Will continue to monitor. Pt. Oriented to unit   Lounette Sloan K Vivien Barretto, RN  

## 2019-07-07 NOTE — Anesthesia Procedure Notes (Signed)
Procedure Name: Intubation Date/Time: 07/07/2019 8:33 AM Performed by: Kathryne Hitch, CRNA Pre-anesthesia Checklist: Patient identified, Emergency Drugs available, Suction available and Patient being monitored Patient Re-evaluated:Patient Re-evaluated prior to induction Oxygen Delivery Method: Circle system utilized Preoxygenation: Pre-oxygenation with 100% oxygen Induction Type: IV induction Ventilation: Mask ventilation without difficulty Laryngoscope Size: Miller and 3 Grade View: Grade II Tube type: Oral Tube size: 7.5 mm Number of attempts: 2 Airway Equipment and Method: Stylet and Oral airway Placement Confirmation: ETT inserted through vocal cords under direct vision,  positive ETCO2 and breath sounds checked- equal and bilateral Secured at: 23 cm Tube secured with: Tape Dental Injury: Teeth and Oropharynx as per pre-operative assessment

## 2019-07-07 NOTE — Transfer of Care (Signed)
Immediate Anesthesia Transfer of Care Note  Patient: Stanley [name]  Procedure(s) Performed: INSERTION OF Acquanetta Belling RIGHT CAROTID (Right Neck)  Patient Location: PACU  Anesthesia Type:General  Level of Consciousness: drowsy and patient cooperative  Airway & Oxygen Therapy: Patient Spontanous Breathing and Patient connected to face mask oxygen  Post-op Assessment: Report given to RN and Post -op Vital signs reviewed and stable  Post vital signs: Reviewed and stable  Last Vitals:  Vitals Value Taken Time  BP 151/75 07/07/19 0955  Temp 37 C 07/07/19 0955  Pulse 85 07/07/19 0955  Resp 30 07/07/19 0955  SpO2 95 % 07/07/19 0955  Vitals shown include unvalidated device data.  Last Pain:  Vitals:   07/07/19 0615  TempSrc: Oral  PainSc:          Complications: No complications documented.

## 2019-07-07 NOTE — H&P (Signed)
[facility]  Patient name: Stanley [name]       MRN: [id number]        DOB: 02/14/1965          Sex: male   REQUESTING PROVIDER:   Dr. Rayann Heman   REASON FOR CONSULT:   CHF, Batwire evaluation  HISTORY OF PRESENT ILLNESS:  Stanley [name] is a 54 y.o. male, who is here today to discuss possible treatment for his heart failure.  He has had a ICD in place in 2015.  He suffers from coronary artery disease, status post PCI.  He is on a statin for hypercholesterolemia.  He is medically managed for hypertension.  PAST MEDICAL HISTORY   Past Medical History: Diagnosis Date . Acute renal failure (ARF) ([place])  . Acute respiratory failure with hypoxia ([place]) 12/29/2018 . Alcohol abuse  . CAD (coronary artery disease)   a. dx unclear, reported PCI in 2011 but cath 2014 normal coronaries and cardiac CT 07/2016 normal coronaries, no mention of stents. . Chronic chest pain  . Chronic systolic CHF (congestive heart failure) ([place])  . Depression  . Financial difficulties  . Gout  . Gouty arthritis  . History of noncompliance with medical treatment   financial challenges . Hypertension  . ICD (implantable cardioverter-defibrillator) in place   [place] SciICD implant done in [place] 2015 . Insomnia  . Lobar pneumonia ([place]) 12/28/2018 . Nonischemic cardiomyopathy ([place])  . Ventricular tachycardia ([place]) 01/2015  2017 - ATP delivered for sinus tach, degenerated into VT for which he received shock. Recurrent VT 03/2018.    FAMILY HISTORY  Family History Problem Relation Age of Onset . Cancer Mother       Pancreatic . Hypertension Father  . Alzheimer's disease Father  . Gout Father  . Dementia Father  . Hypertension Sister  . Clotting disorder Sister  . Obesity Brother  . Bronchitis Brother  . Heart disease Maternal Grandmother  . Gout Paternal Grandfather    SOCIAL HISTORY:  Social  History  Socioeconomic History . Marital status: Divorced   Spouse name: Not on file . Number of children: 1 . Years of education: 66 . Highest education level: Not on file Occupational History . Occupation: Disability Tobacco Use . Smoking status: Never Smoker . Smokeless tobacco: Never Used Substance and Sexual Activity . Alcohol use: Yes   Alcohol/week: 0.0 standard drinks . Drug use: No . Sexual activity: Not Currently Other Topics Concern . Not on file Social History Narrative  Lives in [place] alone.  Disabled.  Previously worked as a Careers adviser.  Fun: Rest, Read and watch movies.   Social Determinants of Health  Financial Resource Strain:  . Difficulty of Paying Living Expenses:  Food Insecurity:  . Worried About Charity fundraiser in the Last Year:  . Arboriculturist in the Last Year:  Transportation Needs:  . Film/video editor (Medical):  Marland Kitchen Lack of Transportation (Non-Medical):  Physical Activity:  . Days of Exercise per Week:  . Minutes of Exercise per Session:  Stress:  . Feeling of Stress :  Social Connections:  . Frequency of Communication with Friends and Family:  . Frequency of Social Gatherings with Friends and Family:  . Attends Religious Services:  . Active Member of Clubs or Organizations:  . Attends Archivist Meetings:  Marland Kitchen Marital Status:  Intimate Partner Violence:  . Fear of Current or Ex-Partner:  . Emotionally Abused:  Marland Kitchen Physically Abused:  . Sexually Abused:  ALLERGIES:   Allergies Allergen Reactions . Lisinopril Shortness Of Breath . Apple Swelling   Lip Swelling . Other Swelling   Nuts - Lip Swelling . Shellfish Allergy Swelling   Lip Swelling   CURRENT MEDICATIONS:   Current Outpatient Medications Medication Sig Dispense Refill . amiodarone (PACERONE) 200 MG tablet Take 1 tablet (200 mg total) by mouth daily. 90 tablet 3 . atorvastatin (LIPITOR) 40 MG tablet Take 1  tablet (40 mg total) by mouth daily. 90 tablet 3 . busPIRone (BUSPAR) 10 MG tablet Take 1 tablet (10 mg total) by mouth daily. 60 tablet 1 . carvedilol (COREG) 25 MG tablet Take 1 tablet (25 mg total) by mouth 2 (two) times daily with a meal. 180 tablet 3 . digoxin (LANOXIN) 0.125 MG tablet Take 1 tablet (0.125 mg total) by mouth daily. 90 tablet 3 . furosemide (LASIX) 80 MG tablet Take 1 tablet (80 mg total) by mouth See admin instructions. Take 1 tablet (80 mg total) by mouth every Monday, Wednesday, and Friday. May also take 1 tablet (80 mg total) as needed for swelling. 60 tablet 2 . isosorbide-hydrALAZINE (BIDIL) 20-37.5 MG tablet Take 1 tablet by mouth 3 (three) times daily. 90 tablet 3 . losartan (COZAAR) 100 MG tablet Take 1 tablet (100 mg total) by mouth daily. 30 tablet 6 . nitroGLYCERIN (NITROSTAT) 0.4 MG SL tablet Place 1 tablet (0.4 mg total) under the tongue every 5 (five) minutes x 3 doses as needed for chest pain. 30 tablet 6 . potassium chloride SA (KLOR-CON) 20 MEQ tablet Take 1 tablet (20 mEq total) by mouth daily. 90 tablet 3 . spironolactone (ALDACTONE) 25 MG tablet Take 0.5 tablets (12.5 mg total) by mouth daily. 30 tablet 2 . tamsulosin (FLOMAX) 0.4 MG CAPS capsule Take 1 capsule (0.4 mg total) by mouth daily. 30 capsule 3  No current facility-administered medications for this visit.   REVIEW OF SYSTEMS:  [X]  denotes positive finding, [ ]  denotes negative finding Cardiac  Comments: Chest pain or chest pressure: x  Shortness of breath upon exertion: x  Short of breath when lying flat:   Irregular heart rhythm: x     Vascular   Pain in calf, thigh, or hip brought on by ambulation:   Pain in feet at night that wakes you up from your sleep:  x  Blood clot in your veins:   Leg swelling:       Pulmonary   Oxygen at home:   Productive cough:    Wheezing:       Neurologic   Sudden weakness in arms or legs:    Sudden numbness  in arms or legs:    Sudden onset of difficulty speaking or slurred speech:   Temporary loss of vision in one eye:    Problems with dizziness:       Gastrointestinal   Blood in stool:     Vomited blood:       Genitourinary   Burning when urinating:    Blood in urine:      Psychiatric   Major depression:       Hematologic   Bleeding problems:   Problems with blood clotting too easily:      Skin   Rashes or ulcers:      Constitutional   Fever or chills:    PHYSICAL EXAM:  There were no vitals filed for this visit.  GENERAL: The patient is a well-nourished male, in no acute distress. The vital signs are documented above. CARDIAC:  There is a regular rate and rhythm.  VASCULAR: Sonosite was used to evaluate the right carotid artery which appears disease-free.  The bifurcation was located in the mid neck. PULMONARY: Nonlabored respirations MUSCULOSKELETAL: There are no major deformities or cyanosis. NEUROLOGIC: No focal weakness or paresthesias are detected. SKIN: There are no ulcers or rashes noted. PSYCHIATRIC: The patient has a normal affect.  STUDIES:  I reviewed his CT scan with the following findings: Negative neck CTA. No stenosis, beading, or atheromatous changes.  Carotid duplex: Right Carotid: There is no evidence of stenosis in the right ICA.  Bifurcation         inferior to mandible.   Left Carotid: There is no evidence of stenosis in the left ICA.  Bifurcation        inferior to mandible.   Vertebrals: Bilateral vertebral arteries demonstrate antegrade flow.  Subclavians: Normal flow hemodynamics were seen in bilateral subclavian        arteries.   ASSESSMENT and PLAN  I discussed that I think the patient would be a good candidate for either Batwire or Barostim implant.  I discussed the 2 procedures in detail and the discrepancies.  Further decisions will be made  based off of protocols.  Hopefully we can get him a device in the immediate future.  He is eager to proceed.   Leia Alf, MD, FACS [facility] [phone] Pager [phone]   PAtient did not meet criteria for BATWIRE, but is a good candidate for the commercial Barostim. CV:RRR PULM:CTA Neuro;  No focal deficits  Plan for Barostim implant.  Details and risks discussed.  Annamarie Major

## 2019-07-08 DIAGNOSIS — I2510 Atherosclerotic heart disease of native coronary artery without angina pectoris: Secondary | ICD-10-CM | POA: Diagnosis not present

## 2019-07-08 MED ORDER — OXYCODONE-ACETAMINOPHEN 5-325 MG PO TABS: 1.0000 | ORAL_TABLET | ORAL | 0 refills | Status: DC | PRN

## 2019-07-08 NOTE — Discharge Summary (Signed)
[facility]ists Discharge Summary   Patient ID:  Stanley [name] MRN: [id number] DOB/AGE: 56-Jan-1967 56 y.o.  Admit date: 07/07/2019 Discharge date: 07/08/2019 Date of Surgery: 07/07/2019 Surgeon: Surgeon(s): Serafina Mitchell, MD  Admission Diagnosis: CAD (coronary artery disease) [I25.10] Congestive heart failure (CHF) ([place]) [I50.9]  Discharge Diagnoses:  CAD (coronary artery disease) [I25.10] Congestive heart failure (CHF) ([place]) [I50.9]  Secondary Diagnoses: Past Medical History:  Diagnosis Date   Acute renal failure (ARF) ([place])    Acute respiratory failure with hypoxia ([place]) 12/29/2018   Alcohol abuse    Anxiety    CAD (coronary artery disease)    a. dx unclear, reported PCI in 2011 but cath 2014 normal coronaries and cardiac CT 07/2016 normal coronaries, no mention of stents.   Chronic chest pain    Chronic systolic CHF (congestive heart failure) ([place])    Depression    Dyspnea    07/05/2019: per patient some times with exertion, "has to catch breath"   Financial difficulties    Gout    Gouty arthritis    Headache    History of noncompliance with medical treatment    financial challenges   Hypertension    ICD (implantable cardioverter-defibrillator) in place    [facility] SciICD implant done in [place] 2015   Insomnia    Lobar pneumonia ([place]) 12/28/2018   Myocardial infarction ([place]) 2005   Nonischemic cardiomyopathy ([place])    Sleep apnea    per patient, has CPAP does not wear it every night   Ventricular tachycardia ([place]) 01/2015   2017 - ATP delivered for sinus tach, degenerated into VT for which he received shock. Recurrent VT 03/2018.    Procedure(s): INSERTION OF BAROSTIM RIGHT CAROTID  Discharged Condition: good  HPI: Stanley [name] is a 54 y.o. male, who is here today to discuss possible treatment for his heart failure.  He has had a ICD in place in 2015.  He suffers from coronary artery disease, status post PCI.  He is on a  statin for hypercholesterolemia.  He is medically managed for hypertension.   Hospital Course:  Stanley [name] is a 54 y.o. male is S/P  Procedure(s): INSERTION OF BAROSTIM RIGHT CAROTID Right neck incision healing well N/V/M intact, no neurologic deficits.  Stable for discharge home.   Significant Diagnostic Studies: CBC Lab Results  Component Value Date   WBC 13.2 (H) 07/07/2019   HGB 12.2 (L) 07/07/2019   HCT 37.5 (L) 07/07/2019   MCV 102.5 (H) 07/07/2019   PLT 274 07/07/2019    BMET    Component Value Date/Time   NA 140 07/07/2019 0605   NA 137 03/08/2019 1220   K 3.0 (L) 07/07/2019 0605   CL 105 07/07/2019 0605   CO2 25 07/05/2019 1422   GLUCOSE 104 (H) 07/07/2019 0605   BUN 21 (H) 07/07/2019 0605   BUN 19 03/08/2019 1220   CREATININE 1.12 07/07/2019 1332   CALCIUM 9.2 07/05/2019 1422   GFRNONAA >60 07/07/2019 1332   GFRAA >60 07/07/2019 1332   COAG Lab Results  Component Value Date   INR 1.0 07/05/2019     Disposition:  Discharge to :Home Discharge Instructions    Call MD for:  redness, tenderness, or signs of infection (pain, swelling, bleeding, redness, odor or green/yellow discharge around incision site)   Complete by: As directed    Call MD for:  redness, tenderness, or signs of infection (pain, swelling, bleeding, redness, odor or green/yellow discharge around incision site)   Complete by:  As directed    Call MD for:  severe or increased pain, loss or decreased feeling  in affected limb(s)   Complete by: As directed    Call MD for:  severe or increased pain, loss or decreased feeling  in affected limb(s)   Complete by: As directed    Call MD for:  temperature >100.5   Complete by: As directed    Call MD for:  temperature >100.5   Complete by: As directed    Resume previous diet   Complete by: As directed    Resume previous diet   Complete by: As directed      Allergies as of 07/08/2019      Reactions   Lisinopril Shortness Of Breath    Apple Swelling   Lip Swelling   Other Swelling   Nuts - Lip Swelling   Shellfish Allergy Swelling   Lip Swelling      Medication List    STOP taking these medications   atorvastatin 40 MG tablet Commonly known as: LIPITOR   busPIRone 10 MG tablet Commonly known as: BUSPAR     TAKE these medications   amiodarone 200 MG tablet Commonly known as: PACERONE Take 1 tablet (200 mg total) by mouth daily.   BiDil 20-37.5 MG tablet Generic drug: isosorbide-hydrALAZINE Take 1 tablet by mouth 3 (three) times daily.   carvedilol 25 MG tablet Commonly known as: COREG Take 1 tablet (25 mg total) by mouth 2 (two) times daily with a meal.   digoxin 0.125 MG tablet Commonly known as: LANOXIN Take 1 tablet (0.125 mg total) by mouth daily.   furosemide 80 MG tablet Commonly known as: LASIX Take 1 tablet (80 mg total) by mouth See admin instructions. Take 1 tablet (80 mg total) by mouth every Monday, Wednesday, and Friday. May also take 1 tablet (80 mg total) as needed for swelling. What changed: additional instructions   losartan 100 MG tablet Commonly known as: COZAAR Take 1 tablet (100 mg total) by mouth daily. What changed: how much to take   nitroGLYCERIN 0.4 MG SL tablet Commonly known as: NITROSTAT Place 1 tablet (0.4 mg total) under the tongue every 5 (five) minutes x 3 doses as needed for chest pain.   oxyCODONE-acetaminophen 5-325 MG tablet Commonly known as: Percocet Take 1 tablet by mouth every 4 (four) hours as needed for severe pain.   potassium chloride SA 20 MEQ tablet Commonly known as: KLOR-CON Take 1 tablet (20 mEq total) by mouth daily.   sodium chloride 0.65 % Soln nasal spray Commonly known as: OCEAN Place 1 spray into both nostrils daily as needed for congestion.   spironolactone 25 MG tablet Commonly known as: ALDACTONE Take 0.5 tablets (12.5 mg total) by mouth daily. What changed:   when to take this  reasons to take this   tamsulosin 0.4 MG  Caps capsule Commonly known as: FLOMAX Take 1 capsule (0.4 mg total) by mouth daily.      Verbal and written Discharge instructions given to the patient. Wound care per Discharge AVS  Follow-up Information    Serafina Mitchell, MD In 2 weeks.   Specialties: Vascular Surgery, Cardiology Why: The office will call the patient with an appointment(sent) Contact information: [address] [place] [address] [phone]               Signed: Roxy Horseman 07/08/2019, 10:21 AM

## 2019-07-08 NOTE — Care Management CC44 (Signed)
Condition Code 44 Documentation Completed  Patient Details  Name: Benoit Meech MRN: [id number] Date of Birth: 11/21/1965   Condition Code 44 given:  Yes Patient signature on Condition Code 44 notice:  Yes Documentation of 2 MD's agreement:  Yes Code 44 added to claim:  Yes    Bartholomew Crews, RN 07/08/2019, 10:35 AM

## 2019-07-08 NOTE — Progress Notes (Signed)
Order received to discharge patient.  Telemetry monitor removed and CCMD notified.  PIV access removed.  Discharge instructions, follow up, medications and instructions for their use discussed with patient. 

## 2019-07-08 NOTE — Progress Notes (Signed)
[facility] [place] [facility]  Subjective  -neck is little bit sore but no other complaints.   Objective (!) 149/83 77 98.2 F (36.8 C) (Oral) 18 98%  Intake/Output Summary (Last 24 hours) at 07/08/2019 0912 Last data filed at 07/08/2019 0400 Gross per 24 hour  Intake 1430 ml  Output 430 ml  Net 1000 ml    Right neck incision clean dry and intact with no hematoma  Laboratory Lab Results: Recent Labs    07/05/19 1422 07/05/19 1422 07/07/19 0605 07/07/19 1332  WBC 12.6*  --   --  13.2*  HGB 13.5   < > 14.3 12.2*  HCT 41.7   < > 42.0 37.5*  PLT 294  --   --  274   < > = values in this interval not displayed.   BMET Recent Labs    07/05/19 1422 07/05/19 1422 07/07/19 0605 07/07/19 1332  NA 138  --  140  --   K 3.0*  --  3.0*  --   CL 104  --  105  --   CO2 25  --   --   --   GLUCOSE 117*  --  104*  --   BUN 14  --  21*  --   CREATININE 1.12   < > 1.30* 1.12  CALCIUM 9.2  --   --   --    < > = values in this interval not displayed.    COAG Lab Results  Component Value Date   INR 1.0 07/05/2019   No results found for: PTT  Assessment/Planning:  Postop day 1 status post barostim implant for heart failure.  Doing well this morning.  Neck incision is clean dry and intact.  Plan for discharge today and follow-up in 2 weeks with Dr. Trula Dickison.  We will send pain meds to his pharmacy.  He knows to call with questions or concerns.  Stanley [name] 07/08/2019 9:12 AM --

## 2019-07-08 NOTE — Discharge Instructions (Signed)
May shower daily, no wound care needed.  Gradually get back into normal daily activities.

## 2019-07-08 NOTE — Care Management Obs Status (Signed)
[facility] NOTIFICATION   Patient Details  Name: Stanley [name] MRN: [id number] Date of Birth: 08/24/1965   Medicare Observation Status Notification Given:  Yes    Bartholomew Crews, RN 07/08/2019, 10:35 AM

## 2019-07-11 ENCOUNTER — Other Ambulatory Visit: Payer: Self-pay

## 2019-07-11 MED ORDER — AMIODARONE HCL 200 MG PO TABS: 200.0000 mg | ORAL_TABLET | Freq: Every day | ORAL | 1 refills | Status: DC

## 2019-07-13 NOTE — Anesthesia Postprocedure Evaluation (Signed)
Anesthesia Post Note  Patient: Stanley [name]  Procedure(s) Performed: INSERTION OF Acquanetta Belling RIGHT CAROTID (Right Neck)     Patient location during evaluation: PACU Anesthesia Type: General Level of consciousness: awake and alert Pain management: pain level controlled Vital Signs Assessment: post-procedure vital signs reviewed and stable Respiratory status: spontaneous breathing, nonlabored ventilation, respiratory function stable and patient connected to nasal cannula oxygen Cardiovascular status: blood pressure returned to baseline and stable Postop Assessment: no apparent nausea or vomiting Anesthetic complications: no   No complications documented.  Last Vitals:  Vitals:   07/08/19 0633 07/08/19 0834  BP: 140/74 (!) 149/83  Pulse: 65 77  Resp: 13 18  Temp: 36.7 C 36.8 C  SpO2: 100% 98%    Last Pain:  Vitals:   07/08/19 0900  TempSrc:   PainSc: 0-No pain                 Donte Sarely Stracener

## 2019-07-18 ENCOUNTER — Other Ambulatory Visit: Payer: Self-pay

## 2019-07-18 ENCOUNTER — Telehealth: Payer: Self-pay | Admitting: Student

## 2019-07-18 ENCOUNTER — Ambulatory Visit: Payer: Medicaid Other | Admitting: Student

## 2019-07-18 ENCOUNTER — Encounter: Payer: Self-pay | Admitting: Student

## 2019-07-18 VITALS — BP 115/70 | HR 88

## 2019-07-18 DIAGNOSIS — I428 Other cardiomyopathies: Secondary | ICD-10-CM

## 2019-07-18 DIAGNOSIS — I5022 Chronic systolic (congestive) heart failure: Secondary | ICD-10-CM

## 2019-07-18 DIAGNOSIS — E876 Hypokalemia: Secondary | ICD-10-CM | POA: Diagnosis not present

## 2019-07-18 DIAGNOSIS — I10 Essential (primary) hypertension: Secondary | ICD-10-CM

## 2019-07-18 NOTE — Telephone Encounter (Signed)
Pt was seen today by Oda Kilts, PA. I will forward call to Covering CMA for PA as they have reached out to the pt.

## 2019-07-18 NOTE — Patient Instructions (Addendum)
Medication Instructions:  *If you need a refill on your cardiac medications before your next appointment, please call your pharmacy*  Lab Work: If you have labs (blood work) drawn today and your tests are completely normal, you will receive your results only by: Marland Kitchen MyChart Message (if you have MyChart) OR . A paper copy in the mail If you have any lab test that is abnormal or we need to change your treatment, we will call you to review the results.  Follow-Up: At [facility] you and your health needs are our priority.  As part of our continuing mission to provide you with exceptional heart care, we have created designated Provider Care Teams.  These Care Teams include your primary Cardiologist (physician) and Advanced Practice Providers (APPs -  Physician Assistants and Nurse Practitioners) who all work together to provide you with the care you need, when you need it.  We recommend signing up for the patient portal called "MyChart".  Sign up information is provided on this After Visit Summary.  MyChart is used to connect with patients for Virtual Visits (Telemedicine).  Patients are able to view lab/test results, encounter notes, upcoming appointments, etc.  Non-urgent messages can be sent to your provider as well.   To learn more about what you can do with MyChart, go to [email or web address].    Your next appointment:   You have a scheduled follow-up appointment on Tuesday 08/29/19 at 1:00 pm  The format for your next appointment:   In Person with Legrand Como "[name]" Chalmers Cater, PA-C

## 2019-07-18 NOTE — Telephone Encounter (Signed)
Scheduler thought we called the pt but we did not.

## 2019-07-18 NOTE — Telephone Encounter (Signed)
Stanley [name] is calling sating he is returning a call, but I was unable to find documentation. Please advise.

## 2019-07-18 NOTE — Telephone Encounter (Signed)
Pt was calling to confirm Post OP appt with Dr. Arita Miss tomorrow.

## 2019-07-18 NOTE — Progress Notes (Signed)
Electrophysiology Office Note Date: 07/18/2019  ID:  Stanley [name], DOB [birth year], MRN [id number]  PCP: Default, Provider, MD Primary Cardiologist: Dr. [name]  Electrophysiologist: Thompson Grayer, MD   CC: Barostim titration   Stanley [name] is a 55 y.o. male seen today for barostim titration. He is doing well s/p implantation. He has chronic "aches and pains". The device is programmed low currently so would not expect changes in symptoms. He would like to get back to his "normal, strong, fun loving self". His main complaint is lack of energy and he would like to see improvement here. He remains SOB just getting around the house, and this is his first goal.   Device History: Barostim device implanted 07/07/2019 for chronic systolic heart failure, NYHA III symptoms  Past Medical History:  Diagnosis Date  . Acute renal failure (ARF) ([place])   . Acute respiratory failure with hypoxia ([place]) 12/29/2018  . Alcohol abuse   . Anxiety   . CAD (coronary artery disease)    a. dx unclear, reported PCI in 2011 but cath 2014 normal coronaries and cardiac CT 07/2016 normal coronaries, no mention of stents.  . Chronic chest pain   . Chronic systolic CHF (congestive heart failure) ([place])   . Depression   . Dyspnea    07/05/2019: per patient some times with exertion, "has to catch breath"  . Financial difficulties   . Gout   . Gouty arthritis   . Headache   . History of noncompliance with medical treatment    financial challenges  . Hypertension   . ICD (implantable cardioverter-defibrillator) in place    [employer] implant done in [place] 2015  . Insomnia   . Lobar pneumonia ([place]) 12/28/2018  . Myocardial infarction ([place]) 2005  . Nonischemic cardiomyopathy ([place])   . Sleep apnea    per patient, has CPAP does not wear it every night  . Ventricular tachycardia ([place]) 01/2015   2017 - ATP delivered for sinus tach, degenerated into VT for which he received shock. Recurrent VT 03/2018.    Past Surgical History:  Procedure Laterality Date  . CARDIAC DEFIBRILLATOR PLACEMENT  06/05/13   [employer] Inogen ICD implanted at [facility] in [place] for primary prevention  . HERNIA REPAIR    . PERCUTANEOUS CORONARY STENT INTERVENTION (PCI-S)  2011    Current Outpatient Medications  Medication Sig Dispense Refill  . amiodarone (PACERONE) 200 MG tablet Take 1 tablet (200 mg total) by mouth daily. 90 tablet 1  . carvedilol (COREG) 25 MG tablet Take 1 tablet (25 mg total) by mouth 2 (two) times daily with a meal. 180 tablet 3  . digoxin (LANOXIN) 0.125 MG tablet Take 1 tablet (0.125 mg total) by mouth daily. 90 tablet 3  . furosemide (LASIX) 80 MG tablet Take 1 tablet (80 mg total) by mouth See admin instructions. Take 1 tablet (80 mg total) by mouth every Monday, Wednesday, and Friday. May also take 1 tablet (80 mg total) as needed for swelling. (Patient taking differently: Take 80 mg by mouth See admin instructions. Take 1 tablet (80 mg total) by mouth every Monday, Tuesday, Thursday and Friday. May also take 1 tablet (80 mg total) as needed for swelling.) 60 tablet 2  . isosorbide-hydrALAZINE (BIDIL) 20-37.5 MG tablet Take 1 tablet by mouth 3 (three) times daily. 90 tablet 3  . losartan (COZAAR) 100 MG tablet Take 1 tablet (100 mg total) by mouth daily. (Patient taking differently: Take 50 mg by mouth  daily. ) 30 tablet 6  . nitroGLYCERIN (NITROSTAT) 0.4 MG SL tablet Place 1 tablet (0.4 mg total) under the tongue every 5 (five) minutes x 3 doses as needed for chest pain. 30 tablet 6  . oxyCODONE-acetaminophen (PERCOCET) 5-325 MG tablet Take 1 tablet by mouth every 4 (four) hours as needed for severe pain. 6 tablet 0  . potassium chloride SA (KLOR-CON) 20 MEQ tablet Take 1 tablet (20 mEq total) by mouth daily. 90 tablet 3  . sodium chloride (OCEAN) 0.65 % SOLN nasal spray Place 1 spray into both nostrils daily as needed for congestion.    Marland Kitchen spironolactone (ALDACTONE) 25  MG tablet Take 0.5 tablets (12.5 mg total) by mouth daily. (Patient taking differently: Take 12.5 mg by mouth daily as needed (Fluid). ) 30 tablet 2  . tamsulosin (FLOMAX) 0.4 MG CAPS capsule Take 1 capsule (0.4 mg total) by mouth daily. 30 capsule 3   No current facility-administered medications for this visit.    Allergies:   Lisinopril, Apple, Other, and Shellfish allergy   Social History: Social History   Socioeconomic History  . Marital status: Divorced    Spouse name: Not on file  . Number of children: 1  . Years of education: 56  . Highest education level: Not on file  Occupational History  . Occupation: Disability  Tobacco Use  . Smoking status: Never Smoker  . Smokeless tobacco: Never Used  Vaping Use  . Vaping Use: Never used  Substance and Sexual Activity  . Alcohol use: Yes    Alcohol/week: 0.0 standard drinks    Comment: rarely  . Drug use: No  . Sexual activity: Not Currently  Other Topics Concern  . Not on file  Social History Narrative   Lives in [place] alone.  Disabled.  Previously worked as a Careers adviser.   Fun: Rest, Read and watch movies.    Social Determinants of Health   Financial Resource Strain:   . Difficulty of Paying Living Expenses:   Food Insecurity:   . Worried About Charity fundraiser in the Last Year:   . Arboriculturist in the Last Year:   Transportation Needs:   . Film/video editor (Medical):   Marland Kitchen Lack of Transportation (Non-Medical):   Physical Activity:   . Days of Exercise per Week:   . Minutes of Exercise per Session:   Stress:   . Feeling of Stress :   Social Connections:   . Frequency of Communication with Friends and Family:   . Frequency of Social Gatherings with Friends and Family:   . Attends Religious Services:   . Active Member of Clubs or Organizations:   . Attends Archivist Meetings:   Marland Kitchen Marital Status:   Intimate Partner Violence:   . Fear of Current or Ex-Partner:   . Emotionally  Abused:   Marland Kitchen Physically Abused:   . Sexually Abused:     Family History: Family History  Problem Relation Age of Onset  . Cancer Mother        Pancreatic  . Hypertension Father   . Alzheimer's disease Father   . Gout Father   . Dementia Father   . Hypertension Sister   . Clotting disorder Sister   . Obesity Brother   . Bronchitis Brother   . Heart disease Maternal Grandmother   . Gout Paternal Grandfather      Review of Systems: All other systems reviewed and are otherwise negative except as noted above.  Physical Exam: Vitals:   07/18/19 0931  BP: 115/70  Pulse: 88     GEN- The patient is well appearing, alert and oriented x 3 today.   HEENT: normocephalic, atraumatic; sclera clear, conjunctiva pink; hearing intact; oropharynx clear; neck supple  Lungs- Clear to ausculation bilaterally, normal work of breathing.  No wheezes, rales, rhonchi Heart- Regular rate and rhythm, no murmurs, rubs or gallops  GI- soft, non-tender, non-distended, bowel sounds present  Extremities- no clubbing, cyanosis, or edema  MS- no significant deformity or atrophy Skin- warm and dry, no rash or lesion; PPM pocket well healed Psych- euthymic mood, full affect Neuro- strength and sensation are intact  Device Interrogation- reviewed in detail today. See below for programming, and see SCANNED report.    EKG:  EKG is not ordered today.  Recent Labs: 12/28/2018: B Natriuretic Peptide 464.9 12/31/2018: Magnesium 2.1 01/27/2019: NT-Pro BNP 383; TSH 0.319 07/05/2019: ALT 30 07/07/2019: BUN 21; Creatinine, Ser 1.12; Hemoglobin 12.2; Platelets 274; Potassium 3.0; Sodium 140   Wt Readings from Last 3 Encounters:  07/07/19 180 lb (81.6 kg)  07/05/19 181 lb 9.6 oz (82.4 kg)  05/22/19 178 lb (80.7 kg)     Other studies Reviewed: Additional studies/ records that were reviewed today include: Previous office notes. HF notes.   Assessment and Plan:  1. Chronic systolic CHF s/p Barostim  implantation  Neck and chest wound sites appear stable.  NYHA III symptoms.  Device titrated from 1.0 millamp to 3.0 milliamp by increments of 0.4 with good blood pressure response and no stimulation.  Device impedence stable.  His goals are to be able to get around the house without SOB or fatigue. Ultimately, he would like to feel how he felt before he was diagnosed with heart failure, and be able to move around and do the things he loves without SOB, fatigue, and "aches and pains" Normal device function See scanned report. Will follow up in 4 weeks to continue titration with goal of 6-8 milliamps for chronic settings.   2. HTN Stable today in the setting of barostim titration.  3. Hypokalemia 3.0 day of surgery. This was seen at the end of his appointment. Pt had already left the building and did not answer when called back, so will request repeat BMET at visit with Dr. Trula Furney tomorrow.   Current medicines are reviewed at length with the patient today.   The patient does not have concerns regarding his medicines.  The following medications changes were made today:  none  Disposition:   Follow up with Me in 4-6 Weeks for further barostim titration.  Jacalyn Lefevre, PA-C  07/18/2019 10:03 AM  [facility] [address] [place] [address] [phone] (office) [phone] (fax)

## 2019-07-19 ENCOUNTER — Ambulatory Visit (INDEPENDENT_AMBULATORY_CARE_PROVIDER_SITE_OTHER): Payer: Self-pay | Admitting: Surgery

## 2019-07-19 ENCOUNTER — Encounter: Payer: Self-pay | Admitting: Surgery

## 2019-07-19 VITALS — BP 110/72 | HR 85 | Temp 97.5°F | Resp 20 | Ht 65.0 in | Wt 187.0 lb

## 2019-07-19 DIAGNOSIS — I5040 Unspecified combined systolic (congestive) and diastolic (congestive) heart failure: Secondary | ICD-10-CM

## 2019-07-19 NOTE — Progress Notes (Signed)
Patient name: Stanley [name] MRN: [id number] DOB: [birth year] Sex: male  REASON FOR VISIT:     post op  HISTORY OF PRESENT ILLNESS:   Stanley [name] is a 54 y.o. male who returns today for his first postoperative visit.  On 07/07/2019 he underwent implantation of a who returns today for his first postoperative visit.  On 07/07/2019 he underwent implantation of a Barostim device.  He was seen in device clinic yesterday and had his device turned up.  He says he is feling better  CURRENT MEDICATIONS:    Current Outpatient Medications  Medication Sig Dispense Refill  . amiodarone (PACERONE) 200 MG tablet Take 1 tablet (200 mg total) by mouth daily. 90 tablet 1  . carvedilol (COREG) 25 MG tablet Take 1 tablet (25 mg total) by mouth 2 (two) times daily with a meal. 180 tablet 3  . digoxin (LANOXIN) 0.125 MG tablet Take 1 tablet (0.125 mg total) by mouth daily. 90 tablet 3  . furosemide (LASIX) 80 MG tablet Take 1 tablet (80 mg total) by mouth See admin instructions. Take 1 tablet (80 mg total) by mouth every Monday, Wednesday, and Friday. May also take 1 tablet (80 mg total) as needed for swelling. (Patient taking differently: Take 80 mg by mouth See admin instructions. Take 1 tablet (80 mg total) by mouth every Monday, Tuesday, Thursday and Friday. May also take 1 tablet (80 mg total) as needed for swelling.) 60 tablet 2  . isosorbide-hydrALAZINE (BIDIL) 20-37.5 MG tablet Take 1 tablet by mouth 3 (three) times daily. 90 tablet 3  . losartan (COZAAR) 100 MG tablet Take 1 tablet (100 mg total) by mouth daily. (Patient taking differently: Take 50 mg by mouth daily. ) 30 tablet 6  . nitroGLYCERIN (NITROSTAT) 0.4 MG SL tablet Place 1 tablet (0.4 mg total) under the tongue every 5 (five) minutes x 3 doses as needed for chest pain. 30 tablet 6  . oxyCODONE-acetaminophen (PERCOCET) 5-325 MG tablet Take 1 tablet by mouth every 4 (four) hours as needed for severe pain. 6  tablet 0  . potassium chloride SA (KLOR-CON) 20 MEQ tablet Take 1 tablet (20 mEq total) by mouth daily. 90 tablet 3  . sodium chloride (OCEAN) 0.65 % SOLN nasal spray Place 1 spray into both nostrils daily as needed for congestion.    Marland Kitchen spironolactone (ALDACTONE) 25 MG tablet Take 0.5 tablets (12.5 mg total) by mouth daily. (Patient taking differently: Take 12.5 mg by mouth daily as needed (Fluid). ) 30 tablet 2  . tamsulosin (FLOMAX) 0.4 MG CAPS capsule Take 1 capsule (0.4 mg total) by mouth daily. 30 capsule 3   No current facility-administered medications for this visit.    REVIEW OF SYSTEMS:   [X]  denotes positive finding, [ ]  denotes negative finding Cardiac  Comments:  Chest pain or chest pressure:    Shortness of breath upon exertion:    Short of breath when lying flat:    Irregular heart rhythm:    Constitutional    Fever or chills:      PHYSICAL EXAM:   Vitals:   07/19/19 1011  BP: 110/72  Pulse: 85  Resp: 20  Temp: (!) 97.5 F (36.4 C)  SpO2: 95%  Weight: 187 lb (84.8 kg)  Height: 5\' 5"  (1.651 m)    GENERAL: The patient is a well-nourished male, in no acute distress. The vital signs are documented above. CARDIOVASCULAR: There is a regular rate and rhythm. PULMONARY: Non-labored respirations Incisions healing nicely  STUDIES:  none   MEDICAL ISSUES:   Doing very well status post device implant.  He is doing well from my perspective.  He will get his future follow up in device clinic  Annamarie Major, IV, MD, FACS [facility] [phone] Pager [phone]

## 2019-07-27 NOTE — Research (Signed)
Six Minute Hall Walk Test Date:    _03_/_MAR_/_2021_  (DD / MMM / [place] Breeding)  Distance Walked _289_ meters  Were there any devices used to assist the subject in walking (e.g. cane, walker, etc.)? [x]  No   []  Yes specify:_______________________  Was the walk terminated before 6 minutes? [x]  No   []  Yes specify reason: (select all that apply)    []  Angina    []   Dyspnea    []   Fatigue    []   Dizziness    []   Syncope    []   Other, specify:  Name of person conducting 6-Minute Hall Walk: __Kimberly Alba Destine :) ___   Page 1 of 1     Confidential   Form Protocol [phone] Rev. B dated 12-Oct-2018   Effective Date: 24-Nov-2018  Subject ID: _[id number]_ - _003_ - 015 Subject Initials: _C_ _-_ _S_ Visit Interval:  [x]   Baseline     []  6 Month  Section B:  NYHA   NYHA Classification Date:    _03_/_MAR_/_2021_  (DD / MMM / [place] Breeding)  Select One Class Subject Symptoms  []  I No limitations of physical activity, No undue fatigue, palpitation or dyspnea  []  II Slight limitation of physical activity, Comfortable at rest, Less than ordinary activity results in fatigue, Palpitation, or dyspnea  [x]  III Marked limitation of physical activity, Comfortable at rest, Less than ordinary activity results in fatigue, palpitation, or dyspnea  []  IV Unable to carry out any physical activity without discomfort, Symptoms of cardiac insufficiency at rest, Physical activity causes increased discomfort  Name of person conducting NYHA: ________________________________________________________

## 2019-07-27 NOTE — Research (Signed)
Section A:  Demographic Section  Subject ID: _[id number]_ - _003_ - 015 Subject Initials: _C_ _-_ _S_  Date of Birth: _[birth year]_/_[birth year]_/_1967____      (DD / MMM / [place] Breeding) Gender: [x]  Male    []  Male  Ethnicity  []  Asian    [x] Black/African Descent [] Middle Eastern        []  White/Caucasian     [] Other/Refused   Section B:  Medical History  1. BAROSTIM NEO FDA Indication for Use  NYHA Classification: Class: _3_ Date:        _22_ / __JAN__ / __2021__                   DD       MMM          YYYY  LVEF: ___35___ % Date:       __25__ / _DEC_ / _2020_                   DD         MMM      YYYY  NT-proBNP: ____383__ pg/ml Date:       _22_ / _JAN_ / _2021_                  DD     MMM      YYYY  CRT Indication: []     Yes          [x]   No Assessment Date:  _22_ / _JAN_/ 2021___                                  DD      MMM   YYYY  2. Type of Cardiomyopathy []  Ischemic [x]  Non-ischemic []  Other, specify: ___________    Yes No Unknown  3. 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 CardioMEMS) Specify Device: ____________ Implant Date: ________/________ (MMM / [place] Breeding) [x]  []    (f) Myocardial Infarction []  [x]  []    (g) CABG []  [x]  []    (h) Coronary Intervention [x]  []  []    (i) Aortic Valve Disease []  [x]  []    (j) Mitral Valve Disease []  [x]  []    (k) Left Ventricular Hypertrophy []  [x]  []   4. Cardiac Arrhythmia (a) Atrial fibrillation []  [x]  []    (b) Ventricular Tachycardia/Fibrillation [x]  []  []    (c) PVC []  [x]  []    (d) Pacemaker []  [x]  []    (e) ICD [x]  []  []    (f) Ablation Procedure Specify:[]  atrial []  ventricular Last procedure date:         ________/________ (MMM / [place] Breeding) [x]  []     (g) Cardioversion Procedure Specify: [] Shock [] Meds Last procedure date:         ________/________ (MMM / [place] Breeding) [x]  []    (h) Sudden Cardiac Death []  [x]  []     Yes No Unknown  4. Peripheral Vascular (a) Peripheral Vascular Disease []  [x]  []    (b) PVD Intervention (e.g. stent, angioplasty, etc.). []  [x]  []   5. Cerebrovascular / Neurological (a) Stroke []  [x]  []    (b) TIA []  [x]  []   6. Renal / Hepatic (a) Prior Renal Denervation Date of Denervation:        ________/________ (MMM / [place] Breeding) [x]  []    (b) Chronic Kidney Disease Stage (1-5): ________   [x]  []    (c) Chronic Dialysis []  [x]  []    (d) Kidney Transplant []  [x]  []   7. Metabolic / Nutritional (a) Diabetes Mellitus  []   Type 1  []   Type 2 [x]  []    (b) Hypokalemia []  [x]  []   Section C:  Comments  none        Section D:  Signature     Person completing form (Print Name): Philemon Kingdom :) __________________   Signature: Philemon Kingdom :) ___ Date: ___07/22/2021_______________________

## 2019-07-27 NOTE — Research (Addendum)
Section A:  Administrative Section  Subject ID: _[id number] - _003_ - 015 Subject Initials: _C_ _-_ _S_ Visit Interval:  (*if required) [x]  Baseline     []  3 Month     []  6 Month     []  12 Month     []  Unscheduled*  Section B:  Carotid Duplex Ultrasound (CDU)  Date:  _03_/_Mar_/_2021_ (DD / MMM / [place] Breeding)  [x]  Images sent to [facility]  % atherosclerosis in the internal or distal common carotid and duplex measurements:  Right Side Measurements    Both internal and distal common carotids are less than 30% [x] Yes  [] No (cannot be used for implant)   Comments on determination of <30% stenosis:  Internal Carotid [x]  Normal (no plaque) PSV (peak systolic velocity) __95_____ cm/sec   []  ?09% EDV (end diastolic velocity) __32_____ cm/sec   []  16-49% (*see CTA) Linear diameter __12_____ mm   []  50-69% Normal spectral waveform  [x]  Yes   []  ?70%  []  No, specify pattern: __________________  Distal Common Carotid [x]  Normal (no plaque) PSV (peak systolic velocity) __67_____ cm/sec   []  ?12% EDV (end diastolic velocity) __45_____ cm/sec   []  16-49% (*see CTA) Linear diameter __13_____ mm   []  50-69% Normal spectral waveform  [x]  Yes   []  ?70%  []  No, specify pattern: __________________  Left Side Measurements   Both internal and distal common carotids are less than 30% [x] Yes  [] No (cannot be used for implant)   Comments on determination of <30% stenosis:  Internal Carotid [x]  Normal (no plaque) PSV (peak systolic velocity) __80_____ cm/sec   []  ?99% EDV (end diastolic velocity) __83_____ cm/sec   []  16-49% (*see CTA) Linear diameter __14_____ mm   []  50-69% Normal spectral waveform  [x]  Yes   []  ?70%  []  No, specify pattern: __________________  Distal Common Carotid [x]  Normal (no plaque) PSV (peak systolic velocity) __38_____ cm/sec   []  ?25% EDV (end diastolic velocity) __05_____ cm/sec   []  16-49% (*see CTA) Linear diameter __15_____ mm   []  50-69% Normal spectral waveform  [x]  Yes   []  ?70%  []   No, specify pattern: __________________  Name of person conducting CDU: [employer] RDMS, RVT  Name of person reading CDU: Harold Barban, MD  Section C:  Computed Tomography Angiogram (CTA) (if required)   []  CDU was inconclusive (*16-49%) Date:  _10_/_Mar_/_2021_           (DD / MMM / [place] Breeding)  [x] Images sent to [facility]  []  CDU was incomplete     Internal Carotid Distal Common Carotid  Right Side _0____% _0____%  Left Side _0____% _0____%  Name of person conducting CTA: Jayme Cloud RT  Name of person reading CTA: Monte Fantasia, MD  Section D:  Baseline Only  Are there any vascular structures or orientations or neck anomalies that would be obstructive to the implantation path? [x] No [] Yes Specify side: (check all that apply)    [] Left [] Right  Has the subject had prior surgery, radiation, or endovascular stent placement in the carotid artery or the carotid sinus region? [x] No [] Yes Specify side: (check all that apply)    [] Left [] Right  Is at least one carotid bifurcation below the level of the mandible? [] No  [] Yes (specify side)     [] Left  [] Right [] Both  Are there any ulcerative carotid arterial plaques? [x] No  [] Yes (specify side)     [] Left  [] Right [] Both  Is this subject a candidate for the BATwire procedure? [x] No  []   Yes (specify side)     [] Left  [] Right [] Both  Section E:  Follow-up Visits Only  Is there new stenosis noted on CDU or CTA? [] No  [] Yes (specify side)     [] Left  [] Right [] Both   If yes is there a greater than 50% stenosis in either artery? [] No [] Yes Specify side: (check all that apply)**     [] Left [] Right   ** CTA should be completed if ?50% stenosis on the implanted side only at 63-month visit  Section F:  Follow-up Only (47-month only)  If the subject had a CTA at baseline, a CTA at 37-month should be performed  Has the subject had new stenosis >40% since baseline in the artery where the lead was implanted? [] No [] Yes# (comment below)  Comments on  determination of >40% stenosis:  # Subject must be followed after the 39-month visit, please see section 3.4.27 Final Study Visit at 12 Months  Section F:  Comments  None              Section G:  Signature    Person completing form (Print Name): Philemon Kingdom :) _______________________   Signature: ___Kimberly Alba Destine :)  Date: _07/22/2021_________________________

## 2019-08-28 NOTE — Progress Notes (Signed)
Electrophysiology Office Note Date: 08/29/2019  ID:  Stanley [name], DOB 1965-02-05, MRN [id number]  PCP: Default, Provider, MD Primary Cardiologist: Dr. Aundra Dubin Electrophysiologist: Thompson Grayer, MD   CC: Barostim titration   Stanley [name] is a 54 y.o. male seen today for barostim titration. He is doing well overall since implantation. At last visit, pt device titrated from 1.0 to 3.0 by 0.4 ma intervals with good BP/HR response and no stimulation.  The patients goals for barostim titration are still to be able get around the house without SOB or fatigue. He has had some weight gain, and doesn't feel like he has made very much progress. He continues to have "aches and pains", including an atypical chest pain that is at times related to eating, movement, and sometimes (though rarely) occurs purely at rest.  This is a chronic issue for him, and not new.   Device History: Barostim (standard) implanted 07/07/2019 for Chronic systolic CHF Boston Scientific single chamber ICD 06/05/2013 for chronic systolic CHF   Past Medical History:  Diagnosis Date  . Acute renal failure (ARF) ([place])   . Acute respiratory failure with hypoxia ([place]) 12/29/2018  . Alcohol abuse   . Anxiety   . CAD (coronary artery disease)    a. dx unclear, reported PCI in 2011 but cath 2014 normal coronaries and cardiac CT 07/2016 normal coronaries, no mention of stents.  . Chronic chest pain   . Chronic systolic CHF (congestive heart failure) ([place])   . Depression   . Dyspnea    07/05/2019: per patient some times with exertion, "has to catch breath"  . Financial difficulties   . Gout   . Gouty arthritis   . Headache   . History of noncompliance with medical treatment    financial challenges  . Hypertension   . ICD (implantable cardioverter-defibrillator) in place    [place] SciICD implant done in [place] 2015  . Insomnia   . Lobar pneumonia ([place]) 12/28/2018  . Myocardial infarction ([place]) 2005  . Nonischemic  cardiomyopathy ([place])   . Sleep apnea    per patient, has CPAP does not wear it every night  . Ventricular tachycardia ([place]) 01/2015   2017 - ATP delivered for sinus tach, degenerated into VT for which he received shock. Recurrent VT 03/2018.   Past Surgical History:  Procedure Laterality Date  . CARDIAC DEFIBRILLATOR PLACEMENT  06/05/13   Boston Scientific Inogen ICD implanted at [facility] in [place] for primary prevention  . HERNIA REPAIR    . PERCUTANEOUS CORONARY STENT INTERVENTION (PCI-S)  2011    Current Outpatient Medications  Medication Sig Dispense Refill  . amiodarone (PACERONE) 200 MG tablet Take 1 tablet (200 mg total) by mouth daily. 90 tablet 1  . carvedilol (COREG) 25 MG tablet Take 1 tablet (25 mg total) by mouth 2 (two) times daily with a meal. 180 tablet 3  . digoxin (LANOXIN) 0.125 MG tablet Take 1 tablet (0.125 mg total) by mouth daily. 90 tablet 3  . furosemide (LASIX) 80 MG tablet Take 1 tablet (80 mg total) by mouth See admin instructions. Take 1 tablet (80 mg total) by mouth every Monday, Wednesday, and Friday. May also take 1 tablet (80 mg total) as needed for swelling. (Patient taking differently: Take 80 mg by mouth See admin instructions. Take 1 tablet (80 mg total) by mouth every Monday, Tuesday, Thursday and Friday. May also take 1 tablet (80 mg total) as needed for swelling.) 60 tablet 2  .  isosorbide-hydrALAZINE (BIDIL) 20-37.5 MG tablet Take 1 tablet by mouth 3 (three) times daily. 90 tablet 3  . losartan (COZAAR) 100 MG tablet Take 1 tablet (100 mg total) by mouth daily. (Patient taking differently: Take 50 mg by mouth daily. ) 30 tablet 6  . nitroGLYCERIN (NITROSTAT) 0.4 MG SL tablet Place 1 tablet (0.4 mg total) under the tongue every 5 (five) minutes x 3 doses as needed for chest pain. 30 tablet 6  . oxyCODONE-acetaminophen (PERCOCET) 5-325 MG tablet Take 1 tablet by mouth every 4 (four) hours as needed for severe pain. 6 tablet 0  . potassium  chloride SA (KLOR-CON) 20 MEQ tablet Take 1 tablet (20 mEq total) by mouth daily. 90 tablet 3  . sodium chloride (OCEAN) 0.65 % SOLN nasal spray Place 1 spray into both nostrils daily as needed for congestion.    Marland Kitchen spironolactone (ALDACTONE) 25 MG tablet Take 0.5 tablets (12.5 mg total) by mouth daily. (Patient taking differently: Take 12.5 mg by mouth daily as needed (Fluid). ) 30 tablet 2  . tamsulosin (FLOMAX) 0.4 MG CAPS capsule Take 1 capsule (0.4 mg total) by mouth daily. 30 capsule 3   No current facility-administered medications for this visit.    Allergies:   Lisinopril, Apple, Other, and Shellfish allergy   Social History: Social History   Socioeconomic History  . Marital status: Divorced    Spouse name: Not on file  . Number of children: 1  . Years of education: 56  . Highest education level: Not on file  Occupational History  . Occupation: Disability  Tobacco Use  . Smoking status: Never Smoker  . Smokeless tobacco: Never Used  Vaping Use  . Vaping Use: Never used  Substance and Sexual Activity  . Alcohol use: Yes    Alcohol/week: 0.0 standard drinks    Comment: rarely  . Drug use: No  . Sexual activity: Not Currently  Other Topics Concern  . Not on file  Social History Narrative   Lives in [place] alone.  Disabled.  Previously worked as a Careers adviser.   Fun: Rest, Read and watch movies.    Social Determinants of Health   Financial Resource Strain:   . Difficulty of Paying Living Expenses: Not on file  Food Insecurity:   . Worried About Charity fundraiser in the Last Year: Not on file  . Ran Out of Food in the Last Year: Not on file  Transportation Needs:   . Lack of Transportation (Medical): Not on file  . Lack of Transportation (Non-Medical): Not on file  Physical Activity:   . Days of Exercise per Week: Not on file  . Minutes of Exercise per Session: Not on file  Stress:   . Feeling of Stress : Not on file  Social Connections:   .  Frequency of Communication with Friends and Family: Not on file  . Frequency of Social Gatherings with Friends and Family: Not on file  . Attends Religious Services: Not on file  . Active Member of Clubs or Organizations: Not on file  . Attends Archivist Meetings: Not on file  . Marital Status: Not on file  Intimate Partner Violence:   . Fear of Current or Ex-Partner: Not on file  . Emotionally Abused: Not on file  . Physically Abused: Not on file  . Sexually Abused: Not on file    Family History: Family History  Problem Relation Age of Onset  . Cancer Mother  Pancreatic  . Hypertension Father   . Alzheimer's disease Father   . Gout Father   . Dementia Father   . Hypertension Sister   . Clotting disorder Sister   . Obesity Brother   . Bronchitis Brother   . Heart disease Maternal Grandmother   . Gout Paternal Grandfather      Review of Systems: All other systems reviewed and are otherwise negative except as noted above.  Physical Exam: Vitals:   08/29/19 1331  BP: (!) 177/100  Pulse: 87    BP improved to 157/95 after device titration, having not taken his afternoon dose of BIDIL.   GEN- The patient is well appearing, alert and oriented x 3 today.   HEENT: normocephalic, atraumatic; sclera clear, conjunctiva pink; hearing intact; oropharynx clear; neck supple  Lungs- Clear to ausculation bilaterally, normal work of breathing.  No wheezes, rales, rhonchi Heart- Regular rate and rhythm, no murmurs, rubs or gallops  GI- soft, non-tender, non-distended, bowel sounds present  Extremities- no clubbing, cyanosis, or edema  MS- no significant deformity or atrophy Skin- warm and dry, no rash or lesion; PPM pocket well healed Psych- euthymic mood, full affect Neuro- strength and sensation are intact  PPM Interrogation- Not performed today.  Barostim interrogation - Performed personally and reviewed in detail today. Programming as below, see scanned report.      EKG:  EKG is not ordered today.  Recent Labs: 12/28/2018: B Natriuretic Peptide 464.9 12/31/2018: Magnesium 2.1 01/27/2019: NT-Pro BNP 383; TSH 0.319 07/05/2019: ALT 30 07/07/2019: BUN 21; Creatinine, Ser 1.12; Hemoglobin 12.2; Platelets 274; Potassium 3.0; Sodium 140   Wt Readings from Last 3 Encounters:  07/19/19 187 lb (84.8 kg)  07/07/19 180 lb (81.6 kg)  07/05/19 181 lb 9.6 oz (82.4 kg)     Other studies Reviewed: Additional studies/ records that were reviewed today include: Echo 09/16/2018 shows LVEF 20-25%, Previous EP office notes. Previous HF clinic notes  Assessment and Plan:  1. Chronic systolic CHF s/p Barostim (standard)  NYHA III symptoms, chronically  Device titrated from 3.0 millamp to 5.0 milliamp by increments of 0.4 with good blood pressure response and no stimulation.  Device impedence stable. Pt goals are to move around the house and do ADLs without SOB or fatigue.   Normal device function See scanned report. Will follow up in 4-6 weeks to continue titration with goal of 6-8 milliamps for chronic settings.   2. HTN Stable currently.   3. Hypokalemia BMET today  4. Atypical chest pain This is not clearly exertional, lasts for as few as "seconds" at a time, and he states it is chronic (as in > 1 year).  Gave him alarm symptoms such as chest pain that does not resolve or his worse than anything he has previously experienced, especially if associated with exertion or resulting in breathlessness.  Current medicines are reviewed at length with the patient today.   The patient does not have concerns regarding his medicines.  The following changes were made today:  none  Labs/ tests ordered today include:  No orders of the defined types were placed in this encounter.  Disposition:   Follow up with me in 4-6 Weeks for further barostim titration.  Jacalyn Lefevre, PA-C  08/29/2019 1:38 PM  [place] [address] [phone] (office) [phone] (fax)

## 2019-08-29 ENCOUNTER — Other Ambulatory Visit: Payer: Self-pay

## 2019-08-29 ENCOUNTER — Encounter: Payer: Self-pay | Admitting: Student

## 2019-08-29 ENCOUNTER — Ambulatory Visit (INDEPENDENT_AMBULATORY_CARE_PROVIDER_SITE_OTHER): Payer: Medicaid Other | Admitting: Student

## 2019-08-29 VITALS — BP 177/100 | HR 87 | Wt 187.0 lb

## 2019-08-29 DIAGNOSIS — I5022 Chronic systolic (congestive) heart failure: Secondary | ICD-10-CM | POA: Diagnosis not present

## 2019-08-29 DIAGNOSIS — E876 Hypokalemia: Secondary | ICD-10-CM | POA: Diagnosis not present

## 2019-08-29 DIAGNOSIS — I428 Other cardiomyopathies: Secondary | ICD-10-CM | POA: Diagnosis not present

## 2019-08-29 DIAGNOSIS — I472 Ventricular tachycardia, unspecified: Secondary | ICD-10-CM

## 2019-08-29 DIAGNOSIS — I10 Essential (primary) hypertension: Secondary | ICD-10-CM

## 2019-08-29 NOTE — Patient Instructions (Addendum)
Medication Instructions:  *If you need a refill on your cardiac medications before your next appointment, please call your pharmacy*  Lab Work: Your physician has recommended that you have lab work today: BMET If you have labs (blood work) drawn today and your tests are completely normal, you will receive your results only by:  MyChart Message (if you have MyChart) OR  A paper copy in the mail If you have any lab test that is abnormal or we need to change your treatment, we will call you to review the results.  Follow-Up: At [facility], you and your health needs are our priority.  As part of our continuing mission to provide you with exceptional heart care, we have created designated Provider Care Teams.  These Care Teams include your primary Cardiologist (physician) and Advanced Practice Providers (APPs -  Physician Assistants and Nurse Practitioners) who all work together to provide you with the care you need, when you need it.  We recommend signing up for the patient portal called "MyChart".  Sign up information is provided on this After Visit Summary.  MyChart is used to connect with patients for Virtual Visits (Telemedicine).  Patients are able to view lab/test results, encounter notes, upcoming appointments, etc.  Non-urgent messages can be sent to your provider as well.   To learn more about what you can do with MyChart, go to [email or web address].    Your next appointment:   Your physician recommends that you schedule a follow-up appointment in 6 WEEKS with Oda Kilts, PA-C -- Monday, 10/16/19 at 12:35 pm  The format for your next appointment:   In Person with Legrand Como "[name]" Chalmers Cater, PA-C

## 2019-08-30 ENCOUNTER — Telehealth: Payer: Self-pay

## 2019-08-30 DIAGNOSIS — E876 Hypokalemia: Secondary | ICD-10-CM

## 2019-08-30 LAB — BASIC METABOLIC PANEL
BUN/Creatinine Ratio: 12 (ref 9–20)
BUN: 12 mg/dL (ref 6–24)
CO2: 22 mmol/L (ref 20–29)
Calcium: 9.6 mg/dL (ref 8.7–10.2)
Chloride: 100 mmol/L (ref 96–106)
Creatinine, Ser: 1.02 mg/dL (ref 0.76–1.27)
GFR calc Af Amer: 95 mL/min/{1.73_m2} (ref >59)
GFR calc non Af Amer: 82 mL/min/{1.73_m2} (ref >59)
Glucose: 108 mg/dL — ABNORMAL HIGH (ref 65–99)
Potassium: 3.1 mmol/L — ABNORMAL LOW (ref 3.5–5.2)
Sodium: 141 mmol/L (ref 134–144)

## 2019-08-30 NOTE — Telephone Encounter (Signed)
-----   Message from Shirley Friar, PA-C sent at 08/30/2019  8:33 AM EDT ----- Potassium 3.1.   Pt needs to take potassium 3 tablets (60 meq) now, then 2 tablets (40 meq) in 4-5 hours to total 100 meq today. (Spreading them out improves absorption)  Then increase chronic dosing to 40 meq daily.   He needs a repeat BMET early next week.

## 2019-08-30 NOTE — Telephone Encounter (Signed)
Pt is aware and agreeable to results and recommendations. Pt will come in Tuesday 9/1 for repeat BMET. Order is in and appt is made

## 2019-09-05 ENCOUNTER — Other Ambulatory Visit: Payer: Medicaid Other | Admitting: *Deleted

## 2019-09-05 ENCOUNTER — Other Ambulatory Visit: Payer: Self-pay

## 2019-09-05 DIAGNOSIS — E876 Hypokalemia: Secondary | ICD-10-CM

## 2019-09-06 ENCOUNTER — Other Ambulatory Visit: Payer: Medicaid Other

## 2019-09-06 LAB — BASIC METABOLIC PANEL
BUN/Creatinine Ratio: 13 (ref 9–20)
BUN: 15 mg/dL (ref 6–24)
CO2: 24 mmol/L (ref 20–29)
Calcium: 9.6 mg/dL (ref 8.7–10.2)
Chloride: 102 mmol/L (ref 96–106)
Creatinine, Ser: 1.13 mg/dL (ref 0.76–1.27)
GFR calc Af Amer: 84 mL/min/{1.73_m2} (ref >59)
GFR calc non Af Amer: 72 mL/min/{1.73_m2} (ref >59)
Glucose: 101 mg/dL — ABNORMAL HIGH (ref 65–99)
Potassium: 3.7 mmol/L (ref 3.5–5.2)
Sodium: 141 mmol/L (ref 134–144)

## 2019-09-27 ENCOUNTER — Ambulatory Visit (INDEPENDENT_AMBULATORY_CARE_PROVIDER_SITE_OTHER): Payer: Medicaid Other | Admitting: *Deleted

## 2019-09-27 DIAGNOSIS — I428 Other cardiomyopathies: Secondary | ICD-10-CM | POA: Diagnosis not present

## 2019-09-27 LAB — CUP PACEART REMOTE DEVICE CHECK
Battery Remaining Longevity: 108 mo
Battery Remaining Percentage: 97 %
Brady Statistic RV Percent Paced: 0 %
Date Time Interrogation Session: 20210922090200
HighPow Impedance: 49 Ohm
Implantable Lead Implant Date: 20150601
Implantable Lead Location: 753860
Implantable Lead Model: 295
Implantable Lead Serial Number: 134196
Implantable Pulse Generator Implant Date: 20150601
Lead Channel Impedance Value: 402 Ohm
Lead Channel Pacing Threshold Amplitude: 1.5 V
Lead Channel Pacing Threshold Pulse Width: 0.4 ms
Lead Channel Setting Pacing Amplitude: 2.5 V
Lead Channel Setting Pacing Pulse Width: 0.4 ms
Lead Channel Setting Sensing Sensitivity: 0.6 mV
Pulse Gen Serial Number: 190689

## 2019-09-29 NOTE — Progress Notes (Signed)
Remote ICD transmission.   

## 2019-10-16 ENCOUNTER — Encounter: Payer: Medicaid Other | Admitting: Student

## 2019-10-20 ENCOUNTER — Encounter: Payer: Medicaid Other | Admitting: Student

## 2019-10-20 NOTE — Progress Notes (Deleted)
Electrophysiology Office Note Date: 10/20/2019  ID:  Stanley [name], DOB 1965-06-10, MRN [id number]  PCP: Default, Provider, MD Primary Cardiologist: Dr. Aundra Dubin Electrophysiologist: Thompson Grayer, MD   CC: Barostim titration   Stanley [name] is a 54 y.o. male seen today for barostim titration. he is doing well since implantation. At last visit, pt device titrated from 3.0 to 5.0 by 0.4 ma intervals with good BP/HR response and no stimulation  The patients goals for barostim titration have been to move around the house and perform all of his ADLs without SOB or fatigue.   ***   Device History: Barostim (standard) implanted 07/07/2019 for Chronic systolic CHF Boston Scientific single chamber ICD 06/2013 for chronic systolic CHF   Past Medical History:  Diagnosis Date  . Acute renal failure (ARF) ([place])   . Acute respiratory failure with hypoxia ([place]) 12/29/2018  . Alcohol abuse   . Anxiety   . CAD (coronary artery disease)    a. dx unclear, reported PCI in 2011 but cath 2014 normal coronaries and cardiac CT 07/2016 normal coronaries, no mention of stents.  . Chronic chest pain   . Chronic systolic CHF (congestive heart failure) ([place])   . Depression   . Dyspnea    07/05/2019: per patient some times with exertion, "has to catch breath"  . Financial difficulties   . Gout   . Gouty arthritis   . Headache   . History of noncompliance with medical treatment    financial challenges  . Hypertension   . ICD (implantable cardioverter-defibrillator) in place    [place] SciICD implant done in [place] 2015  . Insomnia   . Lobar pneumonia ([place]) 12/28/2018  . Myocardial infarction ([place]) 2005  . Nonischemic cardiomyopathy ([place])   . Sleep apnea    per patient, has CPAP does not wear it every night  . Ventricular tachycardia ([place]) 01/2015   2017 - ATP delivered for sinus tach, degenerated into VT for which he received shock. Recurrent VT 03/2018.   Past Surgical History:  Procedure  Laterality Date  . CARDIAC DEFIBRILLATOR PLACEMENT  06/05/13   [employer] Scientific Inogen ICD implanted at [facility] in [place] for primary prevention  . HERNIA REPAIR    . PERCUTANEOUS CORONARY STENT INTERVENTION (PCI-S)  2011    Current Outpatient Medications  Medication Sig Dispense Refill  . amiodarone (PACERONE) 200 MG tablet Take 1 tablet (200 mg total) by mouth daily. 90 tablet 1  . carvedilol (COREG) 25 MG tablet Take 1 tablet (25 mg total) by mouth 2 (two) times daily with a meal. 180 tablet 3  . digoxin (LANOXIN) 0.125 MG tablet Take 1 tablet (0.125 mg total) by mouth daily. 90 tablet 3  . furosemide (LASIX) 80 MG tablet Take 1 tablet (80 mg total) by mouth See admin instructions. Take 1 tablet (80 mg total) by mouth every Monday, Wednesday, and Friday. May also take 1 tablet (80 mg total) as needed for swelling. (Patient taking differently: Take 80 mg by mouth See admin instructions. Take 1 tablet (80 mg total) by mouth every Monday, Tuesday, Thursday and Friday. May also take 1 tablet (80 mg total) as needed for swelling.) 60 tablet 2  . isosorbide-hydrALAZINE (BIDIL) 20-37.5 MG tablet Take 1 tablet by mouth 3 (three) times daily. 90 tablet 3  . losartan (COZAAR) 100 MG tablet Take 1 tablet (100 mg total) by mouth daily. (Patient taking differently: Take 50 mg by mouth daily. ) 30 tablet 6  .  nitroGLYCERIN (NITROSTAT) 0.4 MG SL tablet Place 1 tablet (0.4 mg total) under the tongue every 5 (five) minutes x 3 doses as needed for chest pain. 30 tablet 6  . oxyCODONE-acetaminophen (PERCOCET) 5-325 MG tablet Take 1 tablet by mouth every 4 (four) hours as needed for severe pain. 6 tablet 0  . potassium chloride SA (KLOR-CON) 20 MEQ tablet Take 1 tablet (20 mEq total) by mouth daily. 90 tablet 3  . sodium chloride (OCEAN) 0.65 % SOLN nasal spray Place 1 spray into both nostrils daily as needed for congestion.    Marland Kitchen spironolactone (ALDACTONE) 25 MG tablet Take 0.5 tablets (12.5 mg  total) by mouth daily. (Patient taking differently: Take 12.5 mg by mouth daily as needed (Fluid). ) 30 tablet 2  . tamsulosin (FLOMAX) 0.4 MG CAPS capsule Take 1 capsule (0.4 mg total) by mouth daily. 30 capsule 3   No current facility-administered medications for this visit.    Allergies:   Lisinopril, Apple, Other, and Shellfish allergy   Social History: Social History   Socioeconomic History  . Marital status: Divorced    Spouse name: Not on file  . Number of children: 1  . Years of education: 38  . Highest education level: Not on file  Occupational History  . Occupation: Disability  Tobacco Use  . Smoking status: Never Smoker  . Smokeless tobacco: Never Used  Vaping Use  . Vaping Use: Never used  Substance and Sexual Activity  . Alcohol use: Yes    Alcohol/week: 0.0 standard drinks    Comment: rarely  . Drug use: No  . Sexual activity: Not Currently  Other Topics Concern  . Not on file  Social History Narrative   Lives in [place] alone.  Disabled.  Previously worked as a Careers adviser.   Fun: Rest, Read and watch movies.    Social Determinants of Health   Financial Resource Strain:   . Difficulty of Paying Living Expenses: Not on file  Food Insecurity:   . Worried About Charity fundraiser in the Last Year: Not on file  . Ran Out of Food in the Last Year: Not on file  Transportation Needs:   . Lack of Transportation (Medical): Not on file  . Lack of Transportation (Non-Medical): Not on file  Physical Activity:   . Days of Exercise per Week: Not on file  . Minutes of Exercise per Session: Not on file  Stress:   . Feeling of Stress : Not on file  Social Connections:   . Frequency of Communication with Friends and Family: Not on file  . Frequency of Social Gatherings with Friends and Family: Not on file  . Attends Religious Services: Not on file  . Active Member of Clubs or Organizations: Not on file  . Attends Archivist Meetings: Not on  file  . Marital Status: Not on file  Intimate Partner Violence:   . Fear of Current or Ex-Partner: Not on file  . Emotionally Abused: Not on file  . Physically Abused: Not on file  . Sexually Abused: Not on file    Family History: Family History  Problem Relation Age of Onset  . Cancer Mother        Pancreatic  . Hypertension Father   . Alzheimer's disease Father   . Gout Father   . Dementia Father   . Hypertension Sister   . Clotting disorder Sister   . Obesity Brother   . Bronchitis Brother   . Heart  disease Maternal Grandmother   . Gout Paternal Grandfather      Review of Systems: All other systems reviewed and are otherwise negative except as noted above.  Physical Exam: There were no vitals filed for this visit.   GEN- The patient is well appearing, alert and oriented x 3 today.   HEENT: normocephalic, atraumatic; sclera clear, conjunctiva pink; hearing intact; oropharynx clear; neck supple  Lungs- Clear to ausculation bilaterally, normal work of breathing.  No wheezes, rales, rhonchi Heart- Regular rate and rhythm, no murmurs, rubs or gallops  GI- soft, non-tender, non-distended, bowel sounds present  Extremities- no clubbing, cyanosis, or edema  MS- no significant deformity or atrophy Skin- warm and dry, no rash or lesion; PPM pocket well healed Psych- euthymic mood, full affect Neuro- strength and sensation are intact  PPM Interrogation- reviewed in detail today,  See PACEART report  EKG:  EKG {ACTION; IS/IS [id number]} ordered today.  Recent Labs: 12/28/2018: B Natriuretic Peptide 464.9 12/31/2018: Magnesium 2.1 01/27/2019: NT-Pro BNP 383; TSH 0.319 07/05/2019: ALT 30 07/07/2019: Hemoglobin 12.2; Platelets 274 09/05/2019: BUN 15; Creatinine, Ser 1.13; Potassium 3.7; Sodium 141   Wt Readings from Last 3 Encounters:  08/29/19 187 lb (84.8 kg)  07/19/19 187 lb (84.8 kg)  07/07/19 180 lb (81.6 kg)     Other studies Reviewed: Additional studies/ records  that were reviewed today include: ***  Assessment and Plan:  1. {Blank single:19197::"Cryptogenic Stroke","Palpitations","Syncope"} s/p {Blank single:19197::"Medtronic","St. Jude","Boston Scientific","Biotronik"}  NYHA *** symptoms.   Device titrated from *** millamp to *** milliamp by increments of 0.4 with good blood pressure response and no stimulation.  Device impedence stable. Pt goals are to ***  Normal device function See scanned report. Will follow up in *** weeks to continue titration with goal of 6-8 milliamps for chronic settings.   2. HTN Stable currently  3. Hypokalemia Improved by labs 09/05/2019  4. Atypical chest pain Chronic and unchanged  Current medicines are reviewed at length with the patient today.   The patient {ACTIONS; HAS/DOES NOT HAVE:19233} concerns regarding his medicines.  The following changes were made today:  none  Labs/ tests ordered today include:  No orders of the defined types were placed in this encounter.   Disposition:   Follow up with {Blank single:19197::"Dr. Allred","Dr. Arlan Organ. Klein","Dr. Camnitz","EP APP"} in 4 {Blank single:19197::"Months","Weeks"} for further barostim titration.   Jacalyn Lefevre, PA-C  10/20/2019 7:55 AM  [facility] [address] [place] [address] [phone] (office) [phone] (fax)

## 2019-12-06 ENCOUNTER — Other Ambulatory Visit: Payer: Self-pay

## 2019-12-06 ENCOUNTER — Encounter: Payer: Self-pay | Admitting: Student

## 2019-12-06 ENCOUNTER — Ambulatory Visit: Payer: Medicaid Other | Admitting: Student

## 2019-12-06 VITALS — BP 170/104 | Wt 191.8 lb

## 2019-12-06 DIAGNOSIS — I472 Ventricular tachycardia, unspecified: Secondary | ICD-10-CM

## 2019-12-06 DIAGNOSIS — Z006 Encounter for examination for normal comparison and control in clinical research program: Secondary | ICD-10-CM

## 2019-12-06 DIAGNOSIS — I5022 Chronic systolic (congestive) heart failure: Secondary | ICD-10-CM | POA: Diagnosis not present

## 2019-12-06 DIAGNOSIS — Z79899 Other long term (current) drug therapy: Secondary | ICD-10-CM | POA: Diagnosis not present

## 2019-12-06 NOTE — Progress Notes (Signed)
Electrophysiology Office Note Date: 12/06/2019  ID:  Stanley [name], DOB Mar 10, 1965, MRN [id number]  PCP: Default, Provider, MD Primary Cardiologist: Dr. Aundra Dubin Electrophysiologist: Thompson Grayer, MD   CC: Barostim titration   Stanley [name] is a 54 y.o. male seen today for barostim titration. he is doing well since implantation. At last visit, pt device titrated from 3.0 to 5.0 by 0.4 ma intervals with good BP/HR response and no stimulation  The patients goals for barostim titration are to move around the house and do ADLs without SOB or fatigue.    Device History: Barostim (standard) implanted 07/07/2019 for Chronic systolic CHF [employer] single chamber ICD 06/05/2013 for chronic systolic CHF   Past Medical History:  Diagnosis Date  . Acute renal failure (ARF) ([place])   . Acute respiratory failure with hypoxia ([place]) 12/29/2018  . Alcohol abuse   . Anxiety   . CAD (coronary artery disease)    a. dx unclear, reported PCI in 2011 but cath 2014 normal coronaries and cardiac CT 07/2016 normal coronaries, no mention of stents.  . Chronic chest pain   . Chronic systolic CHF (congestive heart failure) ([place])   . Depression   . Dyspnea    07/05/2019: per patient some times with exertion, "has to catch breath"  . Financial difficulties   . Gout   . Gouty arthritis   . Headache   . History of noncompliance with medical treatment    financial challenges  . Hypertension   . ICD (implantable cardioverter-defibrillator) in place    Stanley [name] implant done in [place] 2015  . Insomnia   . Lobar pneumonia ([place]) 12/28/2018  . Myocardial infarction ([place]) 2005  . Nonischemic cardiomyopathy ([place])   . Sleep apnea    per patient, has CPAP does not wear it every night  . Ventricular tachycardia ([place]) 01/2015   2017 - ATP delivered for sinus tach, degenerated into VT for which he received shock. Recurrent VT 03/2018.   Past Surgical History:  Procedure Laterality Date  . CARDIAC  DEFIBRILLATOR PLACEMENT  06/05/13   [employer] Inogen ICD implanted at [facility] in [place] for primary prevention  . HERNIA REPAIR    . PERCUTANEOUS CORONARY STENT INTERVENTION (PCI-S)  2011    Current Outpatient Medications  Medication Sig Dispense Refill  . amiodarone (PACERONE) 200 MG tablet Take 1 tablet (200 mg total) by mouth daily. 90 tablet 1  . carvedilol (COREG) 25 MG tablet Take 1 tablet (25 mg total) by mouth 2 (two) times daily with a meal. 180 tablet 3  . digoxin (LANOXIN) 0.125 MG tablet Take 1 tablet (0.125 mg total) by mouth daily. 90 tablet 3  . furosemide (LASIX) 80 MG tablet Take 1 tablet (80 mg total) by mouth See admin instructions. Take 1 tablet (80 mg total) by mouth every Monday, Wednesday, and Friday. May also take 1 tablet (80 mg total) as needed for swelling. 60 tablet 2  . isosorbide-hydrALAZINE (BIDIL) 20-37.5 MG tablet Take 1 tablet by mouth 3 (three) times daily. 90 tablet 3  . losartan (COZAAR) 100 MG tablet Take 1 tablet (100 mg total) by mouth daily. 30 tablet 6  . nitroGLYCERIN (NITROSTAT) 0.4 MG SL tablet Place 1 tablet (0.4 mg total) under the tongue every 5 (five) minutes x 3 doses as needed for chest pain. 30 tablet 6  . oxyCODONE-acetaminophen (PERCOCET) 5-325 MG tablet Take 1 tablet by mouth every 4 (four) hours as needed for severe pain. 6 tablet  0  . potassium chloride SA (KLOR-CON) 20 MEQ tablet Take 1 tablet (20 mEq total) by mouth daily. 90 tablet 3  . sodium chloride (OCEAN) 0.65 % SOLN nasal spray Place 1 spray into both nostrils daily as needed for congestion.    Stanley [name] spironolactone (ALDACTONE) 25 MG tablet Take 0.5 tablets (12.5 mg total) by mouth daily. 30 tablet 2  . tamsulosin (FLOMAX) 0.4 MG CAPS capsule Take 1 capsule (0.4 mg total) by mouth daily. 30 capsule 3   No current facility-administered medications for this visit.    Allergies:   Lisinopril, Apple, Other, and Shellfish allergy   Social History: Social History    Socioeconomic History  . Marital status: Divorced    Spouse name: Not on file  . Number of children: 1  . Years of education: 41  . Highest education level: Not on file  Occupational History  . Occupation: Disability  Tobacco Use  . Smoking status: Never Smoker  . Smokeless tobacco: Never Used  Vaping Use  . Vaping Use: Never used  Substance and Sexual Activity  . Alcohol use: Yes    Alcohol/week: 0.0 standard drinks    Comment: rarely  . Drug use: No  . Sexual activity: Not Currently  Other Topics Concern  . Not on file  Social History Narrative   Lives in [place] alone.  Disabled.  Previously worked as a Careers adviser.   Fun: Rest, Read and watch movies.    Social Determinants of Health   Financial Resource Strain:   . Difficulty of Paying Living Expenses: Not on file  Food Insecurity:   . Worried About Charity fundraiser in the Last Year: Not on file  . Ran Out of Food in the Last Year: Not on file  Transportation Needs:   . Lack of Transportation (Medical): Not on file  . Lack of Transportation (Non-Medical): Not on file  Physical Activity:   . Days of Exercise per Week: Not on file  . Minutes of Exercise per Session: Not on file  Stress:   . Feeling of Stress : Not on file  Social Connections:   . Frequency of Communication with Friends and Family: Not on file  . Frequency of Social Gatherings with Friends and Family: Not on file  . Attends Religious Services: Not on file  . Active Member of Clubs or Organizations: Not on file  . Attends Archivist Meetings: Not on file  . Marital Status: Not on file  Intimate Partner Violence:   . Fear of Current or Ex-Partner: Not on file  . Emotionally Abused: Not on file  . Physically Abused: Not on file  . Sexually Abused: Not on file    Family History: Family History  Problem Relation Age of Onset  . Cancer Mother        Pancreatic  . Hypertension Father   . Alzheimer's disease Father   .  Gout Father   . Dementia Father   . Hypertension Sister   . Clotting disorder Sister   . Obesity Brother   . Bronchitis Brother   . Heart disease Maternal Grandmother   . Gout Paternal Grandfather      Review of Systems: All other systems reviewed and are otherwise negative except as noted above.  Physical Exam: Vitals:   12/06/19 1250  Weight: 191 lb 12.8 oz (87 kg)     GEN- The patient is well appearing, alert and oriented x 3 today.   HEENT: normocephalic, atraumatic;  sclera clear, conjunctiva pink; hearing intact; oropharynx clear; neck supple  Lungs- Clear to ausculation bilaterally, normal work of breathing.  No wheezes, rales, rhonchi Heart- Regular rate and rhythm, no murmurs, rubs or gallops  GI- soft, non-tender, non-distended, bowel sounds present  Extremities- no clubbing, cyanosis, or edema  MS- no significant deformity or atrophy Skin- warm and dry, no rash or lesion; PPM pocket well healed Psych- euthymic mood, full affect Neuro- strength and sensation are intact  Barostim Interrogation- Performed personally and reviewed in detail today,  See scanned report  EKG:  EKG is not ordered today.  Recent Labs: 12/28/2018: B Natriuretic Peptide 464.9 12/31/2018: Magnesium 2.1 01/27/2019: NT-Pro BNP 383; TSH 0.319 07/05/2019: ALT 30 07/07/2019: Hemoglobin 12.2; Platelets 274 09/05/2019: BUN 15; Creatinine, Ser 1.13; Potassium 3.7; Sodium 141   Wt Readings from Last 3 Encounters:  12/06/19 191 lb 12.8 oz (87 kg)  08/29/19 187 lb (84.8 kg)  07/19/19 187 lb (84.8 kg)     Other studies Reviewed: Additional studies/ records that were reviewed today include: Previous EP office notes.   Assessment and Plan:  1. Chronic systolic CHF s/p Barostim (standard)  NYHA II-III symptoms.   Device titrated from 5.0 millamp to 7.0 milliamp by increments of 0.4 with good blood pressure response and no stimulation.  Device impedence stable. Pt goals are to continue to improve  his energy and perform ADLs without difficult. He is now able to walk from his car to buildings and back without SOB.   Normal device function See scanned report. He has reached chronic settings today. Will check barostim every 6 months.   2. Poorly controlled HTN, HTN urgency BP 170/100 on arrival. 160/100 after resting and with device titration. He has only been taking BIDIL twice daily, and misses it 2-3 DAYS a week. We discussed increasing BIDIL but he prefers to take it as directed first at its current dose of 1 tab TID.  We will schedule RN visit next week for BP check, and then see back in office in 3 weeks for ICD check and further BP management as needed.  Will get looped back in with HF clinic follow up.  He has had angioedema on ACE and is NOT an Entresto candidate.   3. Hypokalemia CMET today with sporadic follow up and on amiodarione  4. Atypical chest pain Chronic, unchanged.  This is not clearly exertional, lasts for as few as "seconds" at a time, and he states it is chronic (as in > 1 year).  We have discussed him alarm symptoms such as chest pain that does not resolve or his worse than anything he has previously experienced, especially if associated with exertion or resulting in breathlessness. We reviewed symptoms of stroke and alarm symptoms of worsening chest pain or syncope as reasons to seek immediate care.   5. H/o VT No recent recurrence by remotes Amiodarone surveillance labs today  Current medicines are reviewed at length with the patient today.   The patient does not have concerns regarding his medicines.  The following changes were made today:  none  Labs/ tests ordered today include:  No orders of the defined types were placed in this encounter.   Disposition:   Follow up next week for RN/BP check with EP APP in 3 Weeks for med titration as needed/tolerated.    Jacalyn Lefevre, PA-C  12/06/2019 12:51 PM  [facility] [address] [place] [address] [phone] (office) [phone] (fax)

## 2019-12-06 NOTE — Patient Instructions (Addendum)
Medication Instructions:  *If you need a refill on your cardiac medications before your next appointment, please call your pharmacy*  Lab Work: Your physician has recommended that you have lab work today: CMET, TSH, and Digoxin Level  If you have labs (blood work) drawn today and your tests are completely normal, you will receive your results only by: Marland Kitchen MyChart Message (if you have MyChart) OR . A paper copy in the mail If you have any lab test that is abnormal or we need to change your treatment, we will call you to review the results.  Follow-Up: At [facility], you and your health needs are our priority.  As part of our continuing mission to provide you with exceptional heart care, we have created designated Provider Care Teams.  These Care Teams include your primary Cardiologist (physician) and Advanced Practice Providers (APPs -  Physician Assistants and Nurse Practitioners) who all work together to provide you with the care you need, when you need it.  We recommend signing up for the patient portal called "MyChart".  Sign up information is provided on this After Visit Summary.  MyChart is used to connect with patients for Virtual Visits (Telemedicine).  Patients are able to view lab/test results, encounter notes, upcoming appointments, etc.  Non-urgent messages can be sent to your provider as well.   To learn more about what you can do with MyChart, go to [email or web address].    Your next appointment:   Your physician recommends that you schedule a follow-up appointment in: 3 WEEKS with Oda Kilts, PA-C  Your physician recommends that you schedule a nurse visit in 1 week for a BP check -- Wednesday 12/8 at 2 pm  The format for your next appointment:   In Person with Legrand Como "[name]" Chalmers Cater, PA-C

## 2019-12-07 LAB — COMPREHENSIVE METABOLIC PANEL
ALT: 24 IU/L (ref 0–44)
AST: 26 IU/L (ref 0–40)
Albumin/Globulin Ratio: 1.3 (ref 1.2–2.2)
Albumin: 4.5 g/dL (ref 3.8–4.9)
Alkaline Phosphatase: 70 IU/L (ref 44–121)
BUN/Creatinine Ratio: 12 (ref 9–20)
BUN: 15 mg/dL (ref 6–24)
Bilirubin Total: 0.5 mg/dL (ref 0.0–1.2)
CO2: 21 mmol/L (ref 20–29)
Calcium: 9.2 mg/dL (ref 8.7–10.2)
Chloride: 99 mmol/L (ref 96–106)
Creatinine, Ser: 1.24 mg/dL (ref 0.76–1.27)
GFR calc Af Amer: 75 mL/min/{1.73_m2} (ref >59)
GFR calc non Af Amer: 65 mL/min/{1.73_m2} (ref >59)
Globulin, Total: 3.4 g/dL (ref 1.5–4.5)
Glucose: 108 mg/dL — ABNORMAL HIGH (ref 65–99)
Potassium: 3.1 mmol/L — ABNORMAL LOW (ref 3.5–5.2)
Sodium: 141 mmol/L (ref 134–144)
Total Protein: 7.9 g/dL (ref 6.0–8.5)

## 2019-12-07 LAB — DIGOXIN LEVEL: Digoxin, Serum: 0.8 ng/mL (ref 0.5–0.9)

## 2019-12-07 LAB — TSH: TSH: 0.542 u[IU]/mL (ref 0.450–4.500)

## 2019-12-08 ENCOUNTER — Telehealth: Payer: Self-pay | Admitting: *Deleted

## 2019-12-08 DIAGNOSIS — E876 Hypokalemia: Secondary | ICD-10-CM

## 2019-12-08 MED ORDER — POTASSIUM CHLORIDE CRYS ER 20 MEQ PO TBCR: 40.0000 meq | EXTENDED_RELEASE_TABLET | Freq: Every day | ORAL | 3 refills | Status: DC

## 2019-12-08 MED ORDER — SPIRONOLACTONE 25 MG PO TABS: 25.0000 mg | ORAL_TABLET | Freq: Every day | ORAL | 3 refills | Status: DC

## 2019-12-08 NOTE — Telephone Encounter (Signed)
Potassium low at 3.1.  Please have him take an extra 60 meq (3 tablets) of potassium today ( so 4 tabs or 80 meq total), and increase his potassium to 40 meq daily chronically starting tomorrow.   Please also increase spironolactone to 25 mg daily. (which will also help a little with his BP.)  Can we please also add a BMET to his RN BP check next week?  Thank you! Legrand Como [address]" [place], PA-C  12/07/2019 8:43 AM    Gave patient recommendations. Orders placed. Patient verbalized understanding

## 2019-12-13 ENCOUNTER — Other Ambulatory Visit: Payer: Medicaid Other

## 2019-12-13 ENCOUNTER — Telehealth: Payer: Self-pay

## 2019-12-13 ENCOUNTER — Ambulatory Visit: Payer: Medicaid Other

## 2019-12-13 NOTE — Telephone Encounter (Signed)
Pt was scheduled for a RN visit on 12/8 for BP check after medication and barostim changes. There are no providers in the office on 12/8 who manage barostim. Pt agreed to reschedule his check for 12/10 when Dr. Rayann Heman is in the office.   We reviewed his BP medication instructions. He states he is taking his BP medication as prescribed.

## 2019-12-15 ENCOUNTER — Other Ambulatory Visit: Payer: Self-pay

## 2019-12-15 ENCOUNTER — Ambulatory Visit (INDEPENDENT_AMBULATORY_CARE_PROVIDER_SITE_OTHER): Payer: Medicaid Other | Admitting: *Deleted

## 2019-12-15 ENCOUNTER — Other Ambulatory Visit: Payer: Medicaid Other | Admitting: *Deleted

## 2019-12-15 VITALS — BP 140/88 | HR 80 | Ht 65.0 in | Wt 193.4 lb

## 2019-12-15 DIAGNOSIS — I10 Essential (primary) hypertension: Secondary | ICD-10-CM

## 2019-12-15 DIAGNOSIS — E876 Hypokalemia: Secondary | ICD-10-CM

## 2019-12-15 NOTE — Progress Notes (Signed)
Spoke with pt and B/P reading is prior to taking second Bidil .Also pt notes stinging sensation to Barostim device site when turns head, coughs or yawn Disused with Dr Rayann Heman and B/P is good and give reassurance that site is fine continue to monitor if no improvement call back Pt verbalized understanding ./cy

## 2019-12-16 LAB — BASIC METABOLIC PANEL
BUN/Creatinine Ratio: 13 (ref 9–20)
BUN: 20 mg/dL (ref 6–24)
CO2: 21 mmol/L (ref 20–29)
Calcium: 9.4 mg/dL (ref 8.7–10.2)
Chloride: 100 mmol/L (ref 96–106)
Creatinine, Ser: 1.49 mg/dL — ABNORMAL HIGH (ref 0.76–1.27)
GFR calc Af Amer: 60 mL/min/{1.73_m2} (ref >59)
GFR calc non Af Amer: 52 mL/min/{1.73_m2} — ABNORMAL LOW (ref >59)
Glucose: 114 mg/dL — ABNORMAL HIGH (ref 65–99)
Potassium: 3.6 mmol/L (ref 3.5–5.2)
Sodium: 139 mmol/L (ref 134–144)

## 2019-12-26 ENCOUNTER — Ambulatory Visit (INDEPENDENT_AMBULATORY_CARE_PROVIDER_SITE_OTHER): Payer: Medicaid Other | Admitting: Student

## 2019-12-26 ENCOUNTER — Other Ambulatory Visit: Payer: Self-pay

## 2019-12-26 ENCOUNTER — Encounter: Payer: Self-pay | Admitting: Student

## 2019-12-26 VITALS — BP 124/70 | HR 72 | Ht 65.0 in | Wt 193.0 lb

## 2019-12-26 DIAGNOSIS — I10 Essential (primary) hypertension: Secondary | ICD-10-CM | POA: Diagnosis not present

## 2019-12-26 DIAGNOSIS — E876 Hypokalemia: Secondary | ICD-10-CM | POA: Diagnosis not present

## 2019-12-26 DIAGNOSIS — I428 Other cardiomyopathies: Secondary | ICD-10-CM

## 2019-12-26 DIAGNOSIS — I472 Ventricular tachycardia, unspecified: Secondary | ICD-10-CM

## 2019-12-26 DIAGNOSIS — I519 Heart disease, unspecified: Secondary | ICD-10-CM

## 2019-12-26 MED ORDER — DIGOXIN 125 MCG PO TABS: 0.1250 mg | ORAL_TABLET | Freq: Every day | ORAL | 3 refills | Status: DC

## 2019-12-26 NOTE — Patient Instructions (Signed)
Medication Instructions:  *If you need a refill on your cardiac medications before your next appointment, please call your pharmacy*  Follow-Up: At [facility], you and your health needs are our priority.  As part of our continuing mission to provide you with exceptional heart care, we have created designated Provider Care Teams.  These Care Teams include your primary Cardiologist (physician) and Advanced Practice Providers (APPs -  Physician Assistants and Nurse Practitioners) who all work together to provide you with the care you need, when you need it.  We recommend signing up for the patient portal called "MyChart".  Sign up information is provided on this After Visit Summary.  MyChart is used to connect with patients for Virtual Visits (Telemedicine).  Patients are able to view lab/test results, encounter notes, upcoming appointments, etc.  Non-urgent messages can be sent to your provider as well.   To learn more about what you can do with MyChart, go to [email or web address].    Your next appointment:   Your physician recommends that you schedule a follow-up appointment on 03/19/20 at 11:45 am with Oda Kilts, PA-C  The format for your next appointment:   In Person with Legrand Como "[name]" Chalmers Cater, PA-C

## 2019-12-26 NOTE — Progress Notes (Signed)
Electrophysiology Office Note Date: 12/26/2019  ID:  Stanley [name], DOB 1965-05-23, MRN [id number]  PCP: Stanley Pounds, NP Primary Cardiologist: Dr. Aundra Dubin Electrophysiologist: Thompson Grayer, MD   CC: Barostim titration   Stanley [name] is a 54 y.o. male seen today for barostim titration. he is doing well since implantation. At last visit, pt device titrated from 5.0 to 7.0 by 0.4 ma intervals with good BP/HR response and no stimulation  Pt presents today for BP check and states he has had intermittent stim since shortly after last adjustment. Occurs with head turning, coughing, or yawning, and feels like a vibration.   The patients goals for barostim titration remain "to move around the house and do ADLs without SOB or fatigue."  Device History: Barostim (standard) implanted 07/07/2019 for Chronic systolic CHF [employer] single chamber ICD 06/05/2013 for chronic systolic CHF   Past Medical History:  Diagnosis Date  . Acute renal failure (ARF) ([place])   . Acute respiratory failure with hypoxia ([place]) 12/29/2018  . Alcohol abuse   . Anxiety   . CAD (coronary artery disease)    a. dx unclear, reported PCI in 2011 but cath 2014 normal coronaries and cardiac CT 07/2016 normal coronaries, no mention of stents.  . Chronic chest pain   . Chronic systolic CHF (congestive heart failure) ([place])   . Depression   . Dyspnea    07/05/2019: per patient some times with exertion, "has to catch breath"  . Financial difficulties   . Gout   . Gouty arthritis   . Headache   . History of noncompliance with medical treatment    financial challenges  . Hypertension   . ICD (implantable cardioverter-defibrillator) in place    [place] SciICD implant done in [place] 2015  . Insomnia   . Lobar pneumonia ([place]) 12/28/2018  . Myocardial infarction ([place]) 2005  . Nonischemic cardiomyopathy ([place])   . Sleep apnea    per patient, has CPAP does not wear it every night  . Ventricular tachycardia  ([place]) 01/2015   2017 - ATP delivered for sinus tach, degenerated into VT for which he received shock. Recurrent VT 03/2018.   Past Surgical History:  Procedure Laterality Date  . CARDIAC DEFIBRILLATOR PLACEMENT  06/05/13   [employer] Inogen ICD implanted at [facility] in [place] for primary prevention  . HERNIA REPAIR    . PERCUTANEOUS CORONARY STENT INTERVENTION (PCI-S)  2011    Current Outpatient Medications  Medication Sig Dispense Refill  . amiodarone (PACERONE) 200 MG tablet Take 1 tablet (200 mg total) by mouth daily. 90 tablet 1  . carvedilol (COREG) 25 MG tablet Take 1 tablet (25 mg total) by mouth 2 (two) times daily with a meal. 180 tablet 3  . furosemide (LASIX) 80 MG tablet Take 1 tablet (80 mg total) by mouth See admin instructions. Take 1 tablet (80 mg total) by mouth every Monday, Wednesday, and Friday. May also take 1 tablet (80 mg total) as needed for swelling. 60 tablet 2  . isosorbide-hydrALAZINE (BIDIL) 20-37.5 MG tablet Take 1 tablet by mouth 3 (three) times daily. 90 tablet 3  . losartan (COZAAR) 100 MG tablet Take 1 tablet (100 mg total) by mouth daily. 30 tablet 6  . nitroGLYCERIN (NITROSTAT) 0.4 MG SL tablet Place 1 tablet (0.4 mg total) under the tongue every 5 (five) minutes x 3 doses as needed for chest pain. 30 tablet 6  . oxyCODONE-acetaminophen (PERCOCET) 5-325 MG tablet Take 1 tablet by  mouth every 4 (four) hours as needed for severe pain. 6 tablet 0  . potassium chloride SA (KLOR-CON) 20 MEQ tablet Take 2 tablets (40 mEq total) by mouth daily. 180 tablet 3  . sodium chloride (OCEAN) 0.65 % SOLN nasal spray Place 1 spray into both nostrils daily as needed for congestion.    Marland Kitchen spironolactone (ALDACTONE) 25 MG tablet Take 1 tablet (25 mg total) by mouth daily. 90 tablet 3  . tamsulosin (FLOMAX) 0.4 MG CAPS capsule Take 1 capsule (0.4 mg total) by mouth daily. 30 capsule 3  . digoxin (LANOXIN) 0.125 MG tablet Take 1 tablet (0.125 mg total) by mouth  daily. 90 tablet 3   No current facility-administered medications for this visit.    Allergies:   Lisinopril, Apple, Entresto [sacubitril-valsartan], Other, and Shellfish allergy   Social History: Social History   Socioeconomic History  . Marital status: Divorced    Spouse name: Not on file  . Number of children: 1  . Years of education: 84  . Highest education level: Not on file  Occupational History  . Occupation: Disability  Tobacco Use  . Smoking status: Never Smoker  . Smokeless tobacco: Never Used  Vaping Use  . Vaping Use: Never used  Substance and Sexual Activity  . Alcohol use: Yes    Alcohol/week: 0.0 standard drinks    Comment: rarely  . Drug use: No  . Sexual activity: Not Currently  Other Topics Concern  . Not on file  Social History Narrative   Lives in [place] alone.  Disabled.  Previously worked as a Careers adviser.   Fun: Rest, Read and watch movies.    Social Determinants of Health   Financial Resource Strain: Not on file  Food Insecurity: Not on file  Transportation Needs: Not on file  Physical Activity: Not on file  Stress: Not on file  Social Connections: Not on file  Intimate Partner Violence: Not on file    Family History: Family History  Problem Relation Age of Onset  . Cancer Mother        Pancreatic  . Hypertension Father   . Alzheimer's disease Father   . Gout Father   . Dementia Father   . Hypertension Sister   . Clotting disorder Sister   . Obesity Brother   . Bronchitis Brother   . Heart disease Maternal Grandmother   . Gout Paternal Grandfather      Review of Systems: All other systems reviewed and are otherwise negative except as noted above.  Physical Exam: Vitals:   12/26/19 1311  BP: 124/70  Pulse: 72  SpO2: 98%  Weight: 193 lb (87.5 kg)  Height: 5\' 5"  (1.651 m)     GEN- The patient is well appearing, alert and oriented x 3 today.   HEENT: normocephalic, atraumatic; sclera clear, conjunctiva pink;  hearing intact; oropharynx clear; neck supple  Lungs- Clear to ausculation bilaterally, normal work of breathing.  No wheezes, rales, rhonchi Heart- Regular rate and rhythm, no murmurs, rubs or gallops  GI- soft, non-tender, non-distended, bowel sounds present  Extremities- no clubbing, cyanosis, or edema  MS- no significant deformity or atrophy Skin- warm and dry, no rash or lesion; PPM pocket well healed Psych- euthymic mood, full affect Neuro- strength and sensation are intact  Barostim Interrogation- Performed personally and reviewed in detail today,  See scanned report  EKG:  EKG is not ordered today.  Recent Labs: 12/28/2018: B Natriuretic Peptide 464.9 12/31/2018: Magnesium 2.1 01/27/2019: NT-Pro BNP 383  07/07/2019: Hemoglobin 12.2; Platelets 274 12/06/2019: ALT 24; TSH 0.542 12/15/2019: BUN 20; Creatinine, Ser 1.49; Potassium 3.6; Sodium 139   Wt Readings from Last 3 Encounters:  12/26/19 193 lb (87.5 kg)  12/15/19 193 lb 6.4 oz (87.7 kg)  12/06/19 191 lb 12.8 oz (87 kg)     Other studies Reviewed: Additional studies/ records that were reviewed today include: Previous EP office notes.   Assessment and Plan:  1. Chronic systolic CHF s/p Barostim (standard)  NYHA II-III symptoms.   Device titrated from 7.0 millamp to 6.0 milliamp for 1 ma safety margin from stim. Pt had complete resolution of stim at this sitting and was unable to reproduce stim.  Device impedence stable.  Pt goals are to continue to improve his energy and perform ADLs without difficult. He is now able to walk from his car to buildings and back without SOB.   Normal device function See scanned report.  Remains at chronic settings. Will check barostim every 6 months.   2. Poorly controlled HTN, HTN urgency BP much improved with medication compliance. No further adjustment at this time.   3. Hypokalemia Improved by last check, and now with more med compliance.   4. Atypical chest pain Chronic,  unchanged.   5. H/o VT No recent recurrence by remotes. Next remote tomorrow.  Amiodarone surveillance labs stable at last visit.   Current medicines are reviewed at length with the patient today.   The patient does not have concerns regarding his medicines.  The following changes were made today:  None  Labs/ tests ordered today include:  No orders of the defined types were placed in this encounter.   Disposition:   RTC 3 months for ov.    Jacalyn Lefevre, PA-C  12/26/2019 2:19 PM  [place] [address] [phone] (office) [phone] (fax)

## 2019-12-27 ENCOUNTER — Ambulatory Visit (INDEPENDENT_AMBULATORY_CARE_PROVIDER_SITE_OTHER): Payer: Medicaid Other

## 2019-12-27 DIAGNOSIS — I428 Other cardiomyopathies: Secondary | ICD-10-CM

## 2019-12-27 LAB — CUP PACEART REMOTE DEVICE CHECK
Battery Remaining Longevity: 108 mo
Battery Remaining Percentage: 98 %
Brady Statistic RV Percent Paced: 0 %
Date Time Interrogation Session: 20211222023100
HighPow Impedance: 50 Ohm
Implantable Lead Implant Date: 20150601
Implantable Lead Location: 753860
Implantable Lead Model: 295
Implantable Lead Serial Number: 134196
Implantable Pulse Generator Implant Date: 20150601
Lead Channel Impedance Value: 406 Ohm
Lead Channel Pacing Threshold Amplitude: 1.5 V
Lead Channel Pacing Threshold Pulse Width: 0.4 ms
Lead Channel Setting Pacing Amplitude: 2.5 V
Lead Channel Setting Pacing Pulse Width: 0.4 ms
Lead Channel Setting Sensing Sensitivity: 0.6 mV
Pulse Gen Serial Number: 190689

## 2020-01-01 ENCOUNTER — Ambulatory Visit: Payer: Self-pay | Admitting: *Deleted

## 2020-01-01 NOTE — Telephone Encounter (Signed)
Patient calling with complaints of difficulty x 1 month. Patient states that sometimes he has a good flow and sometimes he does not. Patient denies any blood with urination. Patient states that he is still having adequate urine output.Patient states that pain with urination comes and goes and the last time it occurred was last month. Patient scheduled for appt with Zelda,NP on 02/19/20. Attempted to offer patient a sooner appt with another provider but patient preferred to be seen by Zelda,NP. Patient advised to return call to office if symptoms become worse before scheduled appt and to seek treatment in the ED. Understanding verbalized.  Reason for Disposition  Urination is difficult to start (i.e., hesitancy) or straining  Answer Assessment - Initial Assessment Questions 1. SYMPTOM: "What's the main symptom you're concerned about?" (e.g., frequency, incontinence)     Difficulty urinating at times 2. ONSET: "When did the difficulty urinating  start?"     A month ago 3. PAIN: "Is there any pain?" If Yes, ask: "How bad is it?" (Scale: 1-10; mild, moderate, severe)    Did experience pain last month but it comes and goes 4. CAUSE: "What do you think is causing the symptoms?"     Concerned he may have prostate cancer 5. OTHER SYMPTOMS: "Do you have any other symptoms?" (e.g., fever, flank pain, blood in urine, pain with urination)     No 6. PREGNANCY: "Is there any chance you are pregnant?" "When was your last menstrual period?"     n/a  Protocols used: URINARY [facility]

## 2020-01-07 NOTE — Telephone Encounter (Signed)
NEEDS UA AND CYTOLOGY Lab appt.

## 2020-01-10 NOTE — Progress Notes (Signed)
Remote ICD transmission.   

## 2020-01-11 NOTE — Telephone Encounter (Signed)
Attempt to reach patient to inform on PCP advising. °No answer and LVM.  °

## 2020-01-15 ENCOUNTER — Other Ambulatory Visit: Payer: Self-pay | Admitting: Internal Medicine

## 2020-01-19 ENCOUNTER — Other Ambulatory Visit: Payer: Self-pay | Admitting: Critical Care Medicine

## 2020-01-19 DIAGNOSIS — I10 Essential (primary) hypertension: Secondary | ICD-10-CM

## 2020-02-09 ENCOUNTER — Other Ambulatory Visit: Payer: Self-pay | Admitting: Nurse Practitioner

## 2020-02-19 ENCOUNTER — Encounter: Payer: Self-pay | Admitting: Nurse Practitioner

## 2020-02-19 ENCOUNTER — Other Ambulatory Visit: Payer: Self-pay

## 2020-02-19 ENCOUNTER — Ambulatory Visit: Payer: Medicaid Other | Attending: Nurse Practitioner | Admitting: Nurse Practitioner

## 2020-02-19 VITALS — BP 124/74 | HR 71 | Temp 98.0°F | Ht 65.0 in | Wt 195.0 lb

## 2020-02-19 DIAGNOSIS — Z7901 Long term (current) use of anticoagulants: Secondary | ICD-10-CM | POA: Diagnosis not present

## 2020-02-19 DIAGNOSIS — F32A Depression, unspecified: Secondary | ICD-10-CM | POA: Insufficient documentation

## 2020-02-19 DIAGNOSIS — Z95810 Presence of automatic (implantable) cardiac defibrillator: Secondary | ICD-10-CM | POA: Diagnosis not present

## 2020-02-19 DIAGNOSIS — I700 Atherosclerosis of aorta: Secondary | ICD-10-CM | POA: Diagnosis not present

## 2020-02-19 DIAGNOSIS — Z1211 Encounter for screening for malignant neoplasm of colon: Secondary | ICD-10-CM

## 2020-02-19 DIAGNOSIS — R3911 Hesitancy of micturition: Secondary | ICD-10-CM | POA: Diagnosis not present

## 2020-02-19 DIAGNOSIS — I252 Old myocardial infarction: Secondary | ICD-10-CM | POA: Diagnosis not present

## 2020-02-19 DIAGNOSIS — R300 Dysuria: Secondary | ICD-10-CM | POA: Insufficient documentation

## 2020-02-19 DIAGNOSIS — F418 Other specified anxiety disorders: Secondary | ICD-10-CM

## 2020-02-19 DIAGNOSIS — I5022 Chronic systolic (congestive) heart failure: Secondary | ICD-10-CM | POA: Diagnosis not present

## 2020-02-19 DIAGNOSIS — F5105 Insomnia due to other mental disorder: Secondary | ICD-10-CM | POA: Insufficient documentation

## 2020-02-19 DIAGNOSIS — D72829 Elevated white blood cell count, unspecified: Secondary | ICD-10-CM | POA: Diagnosis not present

## 2020-02-19 DIAGNOSIS — Z79899 Other long term (current) drug therapy: Secondary | ICD-10-CM | POA: Diagnosis not present

## 2020-02-19 DIAGNOSIS — I10 Essential (primary) hypertension: Secondary | ICD-10-CM | POA: Diagnosis not present

## 2020-02-19 DIAGNOSIS — F419 Anxiety disorder, unspecified: Secondary | ICD-10-CM | POA: Diagnosis not present

## 2020-02-19 DIAGNOSIS — N401 Enlarged prostate with lower urinary tract symptoms: Secondary | ICD-10-CM | POA: Diagnosis not present

## 2020-02-19 DIAGNOSIS — Z9119 Patient's noncompliance with other medical treatment and regimen: Secondary | ICD-10-CM | POA: Diagnosis not present

## 2020-02-19 DIAGNOSIS — I110 Hypertensive heart disease with heart failure: Secondary | ICD-10-CM | POA: Insufficient documentation

## 2020-02-19 DIAGNOSIS — I2510 Atherosclerotic heart disease of native coronary artery without angina pectoris: Secondary | ICD-10-CM | POA: Insufficient documentation

## 2020-02-19 DIAGNOSIS — L811 Chloasma: Secondary | ICD-10-CM

## 2020-02-19 LAB — POCT URINALYSIS DIP (CLINITEK)
Bilirubin, UA: NEGATIVE
Blood, UA: NEGATIVE
Glucose, UA: NEGATIVE mg/dL
Ketones, POC UA: NEGATIVE mg/dL
Leukocytes, UA: NEGATIVE
Nitrite, UA: NEGATIVE
POC PROTEIN,UA: NEGATIVE
Spec Grav, UA: 1.015 (ref 1.010–1.025)
Urobilinogen, UA: 0.2 E.U./dL
pH, UA: 6.5 (ref 5.0–8.0)

## 2020-02-19 MED ORDER — ATORVASTATIN CALCIUM 20 MG PO TABS: 20.0000 mg | ORAL_TABLET | Freq: Every day | ORAL | 3 refills | Status: DC

## 2020-02-19 MED ORDER — TRAZODONE HCL 150 MG PO TABS: 150.0000 mg | ORAL_TABLET | Freq: Every day | ORAL | 0 refills | Status: DC

## 2020-02-19 MED ORDER — HYDROQUINONE 4 % EX CREA: TOPICAL_CREAM | Freq: Two times a day (BID) | CUTANEOUS | 1 refills | Status: DC

## 2020-02-19 NOTE — Progress Notes (Signed)
Assessment & Plan:  Vedanth was seen today for dysuria.  Diagnoses and all orders for this visit:  Essential hypertension -     CMP14+EGFR Continue all antihypertensives as prescribed.  Remember to bring in your blood pressure log with you for your follow up appointment.  DASH/Mediterranean Diets are healthier choices for HTN.    Urinary hesitancy due to benign prostatic hyperplasia -     POCT URINALYSIS DIP (CLINITEK) -     PSA  Leukocytosis, unspecified type -     CBC with Differential  Aortic atherosclerosis (HCC) -     atorvastatin (LIPITOR) 20 MG tablet; Take 1 tablet (20 mg total) by mouth daily. -     Lipid panel  Insomnia secondary to depression with anxiety -     traZODone (DESYREL) 150 MG tablet; Take 1 tablet (150 mg total) by mouth at bedtime.  Melasma -     hydroquinone 4 % cream; Apply topically 2 (two) times daily.  Colon cancer screening -     Ambulatory referral to Gastroenterology    Patient has been counseled on age-appropriate routine health concerns for screening and prevention. These are reviewed and up-to-date. Referrals have been placed accordingly. Immunizations are up-to-date or declined.    Subjective:   Chief Complaint  Patient presents with  . Dysuria    Patient stated when he try to urinate it does not come out easily and discomfort.    HPI Stanley [name] 55 y.o. male presents to office today for follow up.  He has a past medical history of Acute renal failure, Alcohol abuse, Anxiety, CAD, Chronic systolic CHF, Depression, Financial difficulties, Gout, History of noncompliance with medical treatment, HTN,  ICD, Insomnia, Lobar pneumonia (12/28/2018), MI (2005), Sleep apnea, and Ventricular tachycardia (01/2015).   He is currently being followed by Cardiology with last office visit 12-15-2019.   States he was diagnosed with prostatitis over 10 years ago. Now experiencing urinary hesitancy which has been going on for 6-7 months.  He was prescribed tamsulosin a few years ago but is no longer taking. Denies dysuria, hematuria, flank pain or urinary frequency. He is also experiencing erectile dysfunction. Low risk for STD as he states he has not been sexually active in 3 years  Depression with anxiety Endorses Depression and insomnia. Will try trazodone. Denies any thoughts of self harm.  Depression screen [facility] 2/9 02/19/2020 02/07/2019 01/10/2019 09/20/2017 05/12/2017  Decreased Interest 0 3 2 - 3  Down, Depressed, Hopeless 0 1 3 3 3   PHQ - 2 Score 0 4 5 3 6   Altered sleeping 3 3 3 3 2   Tired, decreased energy 3 3 2 3 3   Change in appetite 2 3 2 2 2   Feeling bad or failure about yourself  1 3 3 3 3   Trouble concentrating 3 3 3 2 3   Moving slowly or fidgety/restless 3 2 2 3 3   Suicidal thoughts 1 3 1 2 2   PHQ-9 Score 16 24 21 21 24        Essential Hypertension Blood pressure is well controlled. He is taking amidarone 200 mg daily, coreg 25 mg BID, lasix 80 mg daily, losartan 100 mg daily and spironolactone 25 mg daily.  BP Readings from Last 3 Encounters:  02/19/20 124/74  12/26/19 124/70  12/15/19 140/88      Review of Systems  Constitutional: Negative for fever, malaise/fatigue and weight loss.  HENT: Negative.  Negative for nosebleeds.   Eyes: Negative.  Negative for blurred vision, double  vision and photophobia.  Respiratory: Negative.  Negative for cough and shortness of breath.   Cardiovascular: Negative.  Negative for chest pain, palpitations and leg swelling.  Gastrointestinal: Negative.  Negative for heartburn, nausea and vomiting.  Genitourinary: Positive for urgency. Negative for dysuria, flank pain, frequency and hematuria.       SEE HPI  Musculoskeletal: Negative.  Negative for myalgias.  Neurological: Negative.  Negative for dizziness, focal weakness, seizures and headaches.  Psychiatric/Behavioral: Positive for depression. Negative for substance abuse and suicidal ideas. The patient has insomnia.      Past Medical History:  Diagnosis Date  . Acute renal failure (ARF) ([place])   . Acute respiratory failure with hypoxia ([place]) 12/29/2018  . Alcohol abuse   . Anxiety   . CAD (coronary artery disease)    a. dx unclear, reported PCI in 2011 but cath 2014 normal coronaries and cardiac CT 07/2016 normal coronaries, no mention of stents.  . Chronic chest pain   . Chronic systolic CHF (congestive heart failure) ([place])   . Depression   . Dyspnea    07/05/2019: per patient some times with exertion, "has to catch breath"  . Financial difficulties   . Gout   . Gouty arthritis   . Headache   . History of noncompliance with medical treatment    financial challenges  . Hypertension   . ICD (implantable cardioverter-defibrillator) in place    [place] SciICD implant done in [place] 2015  . Insomnia   . Lobar pneumonia ([place]) 12/28/2018  . Myocardial infarction ([place]) 2005  . Nonischemic cardiomyopathy ([place])   . Sleep apnea    per patient, has CPAP does not wear it every night  . Ventricular tachycardia ([place]) 01/2015   2017 - ATP delivered for sinus tach, degenerated into VT for which he received shock. Recurrent VT 03/2018.    Past Surgical History:  Procedure Laterality Date  . CARDIAC DEFIBRILLATOR PLACEMENT  06/05/13   [employer] Inogen ICD implanted at [facility] in [place] for primary prevention  . HERNIA REPAIR    . PERCUTANEOUS CORONARY STENT INTERVENTION (PCI-S)  2011    Family History  Problem Relation Age of Onset  . Cancer Mother        Pancreatic  . Hypertension Father   . Alzheimer's disease Father   . Gout Father   . Dementia Father   . Hypertension Sister   . Clotting disorder Sister   . Obesity Brother   . Bronchitis Brother   . Heart disease Maternal Grandmother   . Gout Paternal Grandfather     Social History Reviewed with no changes to be made today.   Outpatient Medications Prior to Visit  Medication Sig Dispense Refill  . amiodarone (PACERONE)  200 MG tablet TAKE 1 TABLET(200 MG) BY MOUTH DAILY 90 tablet 3  . carvedilol (COREG) 25 MG tablet TAKE 1 TABLET(25 MG) BY MOUTH TWICE DAILY WITH A MEAL 180 tablet 3  . digoxin (LANOXIN) 0.125 MG tablet Take 1 tablet (0.125 mg total) by mouth daily. 90 tablet 3  . furosemide (LASIX) 80 MG tablet TAKE 1 TABLET BY MOUTH ON MONDAY, WEDNESDAY, AND FRIDAY. MAY ALSO TAKE 1 TABLET AS NEEDED FOR SWELLING 60 tablet 2  . isosorbide-hydrALAZINE (BIDIL) 20-37.5 MG tablet Take 1 tablet by mouth 3 (three) times daily. 90 tablet 3  . losartan (COZAAR) 100 MG tablet Take 1 tablet (100 mg total) by mouth daily. 30 tablet 6  . nitroGLYCERIN (NITROSTAT) 0.4 MG SL tablet  Place 1 tablet (0.4 mg total) under the tongue every 5 (five) minutes x 3 doses as needed for chest pain. 30 tablet 6  . potassium chloride SA (KLOR-CON) 20 MEQ tablet Take 2 tablets (40 mEq total) by mouth daily. 180 tablet 3  . sodium chloride (OCEAN) 0.65 % SOLN nasal spray Place 1 spray into both nostrils daily as needed for congestion.    Marland Kitchen spironolactone (ALDACTONE) 25 MG tablet Take 1 tablet (25 mg total) by mouth daily. 90 tablet 3  . oxyCODONE-acetaminophen (PERCOCET) 5-325 MG tablet Take 1 tablet by mouth every 4 (four) hours as needed for severe pain. (Patient not taking: Reported on 02/19/2020) 6 tablet 0  . tamsulosin (FLOMAX) 0.4 MG CAPS capsule Take 1 capsule (0.4 mg total) by mouth daily. (Patient not taking: Reported on 02/19/2020) 30 capsule 3   No facility-administered medications prior to visit.    Allergies  Allergen Reactions  . Lisinopril Shortness Of Breath  . Apple Swelling    Lip Swelling  . Entresto [Sacubitril-Valsartan]     History of angioedema  . Other Swelling    Nuts - Lip Swelling  . Shellfish Allergy Swelling    Lip Swelling       Objective:    BP 124/74 (BP Location: Right Arm, Patient Position: Sitting, Cuff Size: Large)   Pulse 71   Temp 98 F (36.7 C) (Oral)   Ht $R'5\' 5"'Su$  (1.651 m)   Wt 195 lb (88.5  kg)   SpO2 96%   BMI 32.45 kg/m  Wt Readings from Last 3 Encounters:  02/19/20 195 lb (88.5 kg)  12/26/19 193 lb (87.5 kg)  12/15/19 193 lb 6.4 oz (87.7 kg)    Physical Exam Vitals and nursing note reviewed.  Constitutional:      Appearance: He is well-developed and well-nourished.  HENT:     Head: Normocephalic and atraumatic.  Eyes:     Extraocular Movements: EOM normal.  Cardiovascular:     Rate and Rhythm: Normal rate and regular rhythm.     Pulses: Intact distal pulses.     Heart sounds: Normal heart sounds. No murmur heard. No friction rub. No gallop.   Pulmonary:     Effort: Pulmonary effort is normal. No tachypnea or respiratory distress.     Breath sounds: Normal breath sounds. No decreased breath sounds, wheezing, rhonchi or rales.  Chest:     Chest wall: No tenderness.  Abdominal:     General: Bowel sounds are normal.     Palpations: Abdomen is soft.  Musculoskeletal:        General: No edema. Normal range of motion.     Cervical back: Normal range of motion.  Skin:    General: Skin is warm and dry.     Comments: Melasma on sides of face  Neurological:     Mental Status: He is alert and oriented to person, place, and time.     Coordination: Coordination normal.  Psychiatric:        Attention and Perception: Attention normal.        Mood and Affect: Mood and affect, mood and affect normal.        Speech: Speech normal.        Behavior: Behavior normal. Behavior is cooperative.        Thought Content: Thought content normal.        Cognition and Memory: Cognition and memory normal.        Judgment: Judgment normal.  Patient has been counseled extensively about nutrition and exercise as well as the importance of adherence with medications and regular follow-up. The patient was given clear instructions to go to ER or return to medical center if symptoms don't improve, worsen or new problems develop. The patient verbalized understanding.    Follow-up: Return in about 3 weeks (around 03/11/2020) for f/u insomnia.   Gildardo Pounds, FNP-BC [facility], [place]   02/25/2020, 8:07 PM

## 2020-02-20 LAB — CMP14+EGFR
ALT: 20 IU/L (ref 0–44)
AST: 24 IU/L (ref 0–40)
Albumin/Globulin Ratio: 1.4 (ref 1.2–2.2)
Albumin: 4.6 g/dL (ref 3.8–4.9)
Alkaline Phosphatase: 68 IU/L (ref 44–121)
BUN/Creatinine Ratio: 14 (ref 9–20)
BUN: 18 mg/dL (ref 6–24)
Bilirubin Total: 0.6 mg/dL (ref 0.0–1.2)
CO2: 20 mmol/L (ref 20–29)
Calcium: 9.4 mg/dL (ref 8.7–10.2)
Chloride: 98 mmol/L (ref 96–106)
Creatinine, Ser: 1.26 mg/dL (ref 0.76–1.27)
GFR calc Af Amer: 73 mL/min/{1.73_m2} (ref >59)
GFR calc non Af Amer: 63 mL/min/{1.73_m2} (ref >59)
Globulin, Total: 3.2 g/dL (ref 1.5–4.5)
Glucose: 103 mg/dL — ABNORMAL HIGH (ref 65–99)
Potassium: 3.7 mmol/L (ref 3.5–5.2)
Sodium: 140 mmol/L (ref 134–144)
Total Protein: 7.8 g/dL (ref 6.0–8.5)

## 2020-02-20 LAB — CBC WITH DIFFERENTIAL/PLATELET
Basophils Absolute: 0.1 10*3/uL (ref 0.0–0.2)
Basos: 0 %
EOS (ABSOLUTE): 0.3 10*3/uL (ref 0.0–0.4)
Eos: 2 %
Hematocrit: 43.1 % (ref 37.5–51.0)
Hemoglobin: 14.2 g/dL (ref 13.0–17.7)
Immature Grans (Abs): 0.1 10*3/uL (ref 0.0–0.1)
Immature Granulocytes: 1 %
Lymphocytes Absolute: 4 10*3/uL — ABNORMAL HIGH (ref 0.7–3.1)
Lymphs: 28 %
MCH: 32 pg (ref 26.6–33.0)
MCHC: 32.9 g/dL (ref 31.5–35.7)
MCV: 97 fL (ref 79–97)
Monocytes Absolute: 0.9 10*3/uL (ref 0.1–0.9)
Monocytes: 6 %
Neutrophils Absolute: 9.1 10*3/uL — ABNORMAL HIGH (ref 1.4–7.0)
Neutrophils: 63 %
Platelets: 265 10*3/uL (ref 150–450)
RBC: 4.44 x10E6/uL (ref 4.14–5.80)
RDW: 12.1 % (ref 11.6–15.4)
WBC: 14.3 10*3/uL — ABNORMAL HIGH (ref 3.4–10.8)

## 2020-02-20 LAB — LIPID PANEL
Chol/HDL Ratio: 4.7 ratio (ref 0.0–5.0)
Cholesterol, Total: 273 mg/dL — ABNORMAL HIGH (ref 100–199)
HDL: 58 mg/dL (ref >39)
LDL Chol Calc (NIH): 171 mg/dL — ABNORMAL HIGH (ref 0–99)
Triglycerides: 234 mg/dL — ABNORMAL HIGH (ref 0–149)
VLDL Cholesterol Cal: 44 mg/dL — ABNORMAL HIGH (ref 5–40)

## 2020-02-20 LAB — PSA: Prostate Specific Ag, Serum: 1.6 ng/mL (ref 0.0–4.0)

## 2020-02-23 ENCOUNTER — Telehealth: Payer: Self-pay | Admitting: Nurse Practitioner

## 2020-02-23 NOTE — Telephone Encounter (Signed)
Copied from [place] [id number]. Topic: General - Inquiry >> Feb 23, 2020  1:56 PM Greggory Keen D wrote: Reason for CRM: Pt called saying the cream that was sent in to the pharmacy was too expensive .it was 50 $.  He wants to know if there is some thing else he can use or a way he can get it for less money.  CB#  [phone]

## 2020-02-25 ENCOUNTER — Encounter: Payer: Self-pay | Admitting: Nurse Practitioner

## 2020-02-25 NOTE — Telephone Encounter (Signed)
Not that I am aware of. I can refer him to dermatology if he would like.

## 2020-02-26 ENCOUNTER — Telehealth: Payer: Self-pay | Admitting: Nurse Practitioner

## 2020-02-26 NOTE — Telephone Encounter (Signed)
Copied from [place] [id number]. Topic: General - Inquiry >> Feb 26, 2020 10:38 AM Gillis Ends D wrote: Reason for CRM: Patient would like a call back with his lab results. He can be reached at [phone]. Please advise

## 2020-02-27 ENCOUNTER — Telehealth: Payer: Self-pay | Admitting: Hematology and Oncology

## 2020-02-27 NOTE — Telephone Encounter (Signed)
Received a new hem referral from [name], NP for leukocytosis. Stanley [name] has been cld and scheduled to see Dr. [name] on 3/3 at 1pm. Pt aware to arrive 20 minutes early.

## 2020-02-29 NOTE — Telephone Encounter (Signed)
CMA spoke to patient and informed on lab results.

## 2020-02-29 NOTE — Telephone Encounter (Signed)
Patient stated he picked up the medication using Rx coupon.

## 2020-03-04 ENCOUNTER — Encounter: Payer: Self-pay | Admitting: Internal Medicine

## 2020-03-07 ENCOUNTER — Encounter: Payer: Self-pay | Admitting: Hematology and Oncology

## 2020-03-07 ENCOUNTER — Inpatient Hospital Stay: Payer: Medicaid Other | Attending: Hematology and Oncology | Admitting: Hematology and Oncology

## 2020-03-07 ENCOUNTER — Other Ambulatory Visit: Payer: Self-pay

## 2020-03-07 ENCOUNTER — Inpatient Hospital Stay: Payer: Medicaid Other

## 2020-03-07 ENCOUNTER — Telehealth: Payer: Self-pay | Admitting: Hematology and Oncology

## 2020-03-07 VITALS — BP 155/80 | HR 79 | Temp 98.1°F | Resp 15 | Ht 65.0 in | Wt 195.4 lb

## 2020-03-07 DIAGNOSIS — Z800 Family history of malignant neoplasm of digestive organs: Secondary | ICD-10-CM | POA: Insufficient documentation

## 2020-03-07 DIAGNOSIS — D729 Disorder of white blood cells, unspecified: Secondary | ICD-10-CM

## 2020-03-07 DIAGNOSIS — D72820 Lymphocytosis (symptomatic): Secondary | ICD-10-CM | POA: Insufficient documentation

## 2020-03-07 LAB — CBC WITH DIFFERENTIAL/PLATELET
Abs Immature Granulocytes: 0.09 10*3/uL — ABNORMAL HIGH (ref 0.00–0.07)
Basophils Absolute: 0.1 10*3/uL (ref 0.0–0.1)
Basophils Relative: 0 %
Eosinophils Absolute: 0.2 10*3/uL (ref 0.0–0.5)
Eosinophils Relative: 2 %
HCT: 38.5 % — ABNORMAL LOW (ref 39.0–52.0)
Hemoglobin: 12.7 g/dL — ABNORMAL LOW (ref 13.0–17.0)
Immature Granulocytes: 1 %
Lymphocytes Relative: 18 %
Lymphs Abs: 2.5 10*3/uL (ref 0.7–4.0)
MCH: 32.4 pg (ref 26.0–34.0)
MCHC: 33 g/dL (ref 30.0–36.0)
MCV: 98.2 fL (ref 80.0–100.0)
Monocytes Absolute: 1 10*3/uL (ref 0.1–1.0)
Monocytes Relative: 7 %
Neutro Abs: 9.8 10*3/uL — ABNORMAL HIGH (ref 1.7–7.7)
Neutrophils Relative %: 72 %
Platelets: 250 10*3/uL (ref 150–400)
RBC: 3.92 MIL/uL — ABNORMAL LOW (ref 4.22–5.81)
RDW: 12.7 % (ref 11.5–15.5)
WBC: 13.6 10*3/uL — ABNORMAL HIGH (ref 4.0–10.5)
nRBC: 0 % (ref 0.0–0.2)

## 2020-03-07 LAB — CMP (CANCER CENTER ONLY)
ALT: 19 U/L (ref 0–44)
AST: 25 U/L (ref 15–41)
Albumin: 3.7 g/dL (ref 3.5–5.0)
Alkaline Phosphatase: 66 U/L (ref 38–126)
Anion gap: 13 (ref 5–15)
BUN: 19 mg/dL (ref 6–20)
CO2: 23 mmol/L (ref 22–32)
Calcium: 9.5 mg/dL (ref 8.9–10.3)
Chloride: 105 mmol/L (ref 98–111)
Creatinine: 1.54 mg/dL — ABNORMAL HIGH (ref 0.61–1.24)
GFR, Estimated: 53 mL/min — ABNORMAL LOW (ref >60)
Glucose, Bld: 93 mg/dL (ref 70–99)
Potassium: 3.1 mmol/L — ABNORMAL LOW (ref 3.5–5.1)
Sodium: 141 mmol/L (ref 135–145)
Total Bilirubin: 0.5 mg/dL (ref 0.3–1.2)
Total Protein: 8.7 g/dL — ABNORMAL HIGH (ref 6.5–8.1)

## 2020-03-07 NOTE — Progress Notes (Signed)
[place] NOTE  Patient Care Team: Gildardo Pounds, NP as PCP - General (Nurse Practitioner) [name], Shaune Pascal, MD as PCP - Cardiology (Cardiology) Thompson Grayer, MD as PCP - Electrophysiology (Cardiology)  CHIEF COMPLAINTS/PURPOSE OF CONSULTATION:   Leukocytosis, lymphocytosis  ASSESSMENT & PLAN:  No problem-specific Assessment & Plan notes found for this encounter.  No orders of the defined types were placed in this encounter.  This is a very pleasant 55 year old male patient with past medical history significant for coronary artery disease, congestive heart failure, hypertension, insomnia, myocardial infarction referred to hematology for evaluation of persistent leukocytosis. Mr. Vavra arrived to the appointment today by himself.  He denies any new complaints except for intermittent gouty arthritis which is very bothersome to him.  He denies any fevers, infections, chronic steroid use, erythromelalgia, intractable pruritus. Physical examination unremarkable, no lymphadenopathy or hepatosplenomegaly. I reviewed his labs for the past several years which showed mild neutrophilic leukocytosis with some lymphocytosis. At this time, I have less concern for myeloproliferative disorders but given chronicity, I think it is reasonable to rule out myeloproliferative disorders.  We have ordered myeloproliferative work-up today, he was asked to return to clinic in 4 weeks to review labs and to discuss any additional recommendations.  2.  Family history of pancreatic cancer in mom.  I recommended genetic testing he agrees to the referral.  With regards to his prostate issues and screening colonoscopy, encouraged him to discuss with his primary care physician.  HISTORY OF PRESENTING ILLNESS:  Stanley [name] 55 y.o. male is here because of leukocytosis.  This is a very pleasant 55 year old male patient with past medical history significant for coronary artery disease,  myocardial infarction gout, hypertension referred to hematology for evaluation of persistent leukocytosis.  To the appointment today by himself.  He reports some ongoing gout issues but otherwise denies any recent infections or hospitalizations.  He denies any fevers, drenching night sweats, loss of appetite or loss of weight.  He might have gained some weight overall.  He denies any steroid no change in breathing except for some increased orthopnea.  No change in bowel habits, he has some intermittent constipation at baseline.  No change in urinary habits except for the past 2 days when he has noticed that he needs to strain more to urinate. No erythromelalgia, intractable pruritus, personal history of thromboembolic episodes.  He is a non-smoker.  He drinks about 3 glasses of wine every weekend but previously used to drink a lot more. Rest of the pertinent 10 point ROS as mentioned above and unremarkable.  REVIEW OF SYSTEMS:   Constitutional: Denies fevers, chills or abnormal night sweats Eyes: Denies blurriness of vision, double vision or watery eyes Ears, nose, mouth, throat, and face: Denies mucositis or sore throat Respiratory: Denies cough, dyspnea or wheezes Cardiovascular: Denies palpitation, chest discomfort or lower extremity swelling Gastrointestinal:  Denies nausea, heartburn or change in bowel habits Skin: Denies abnormal skin rashes Lymphatics: Denies new lymphadenopathy or easy bruising Neurological:Denies numbness, tingling or new weaknesses Behavioral/Psych: Mood is stable, no new changes  All other systems were reviewed with the patient and are negative.  MEDICAL HISTORY:  Past Medical History:  Diagnosis Date  . Acute renal failure (ARF) ([place])   . Acute respiratory failure with hypoxia ([place]) 12/29/2018  . Alcohol abuse   . Anxiety   . CAD (coronary artery disease)    a. dx unclear, reported PCI in 2011 but cath 2014 normal coronaries and cardiac CT  07/2016 normal  coronaries, no mention of stents.  . Chronic chest pain   . Chronic systolic CHF (congestive heart failure) ([place])   . Depression   . Dyspnea    07/05/2019: per patient some times with exertion, "has to catch breath"  . Financial difficulties   . Gout   . Gouty arthritis   . Headache   . History of noncompliance with medical treatment    financial challenges  . Hypertension   . ICD (implantable cardioverter-defibrillator) in place    [place] SciICD implant done in [place] 2015  . Insomnia   . Lobar pneumonia ([place]) 12/28/2018  . Myocardial infarction ([place]) 2005  . Nonischemic cardiomyopathy ([place])   . Sleep apnea    per patient, has CPAP does not wear it every night  . Ventricular tachycardia ([place]) 01/2015   2017 - ATP delivered for sinus tach, degenerated into VT for which he received shock. Recurrent VT 03/2018.    SURGICAL HISTORY: Past Surgical History:  Procedure Laterality Date  . CARDIAC DEFIBRILLATOR PLACEMENT  06/05/13   [employer] Inogen ICD implanted at [facility] in [place] for primary prevention  . HERNIA REPAIR    . PERCUTANEOUS CORONARY STENT INTERVENTION (PCI-S)  2011    SOCIAL HISTORY: Social History   Socioeconomic History  . Marital status: Divorced    Spouse name: Not on file  . Number of children: 1  . Years of education: 70  . Highest education level: Not on file  Occupational History  . Occupation: Disability  Tobacco Use  . Smoking status: Never Smoker  . Smokeless tobacco: Never Used  Vaping Use  . Vaping Use: Never used  Substance and Sexual Activity  . Alcohol use: Yes    Alcohol/week: 0.0 standard drinks    Comment: rarely  . Drug use: No  . Sexual activity: Not Currently  Other Topics Concern  . Not on file  Social History Narrative   Lives in [place] alone.  Disabled.  Previously worked as a Careers adviser.   Fun: Rest, Read and watch movies.    Social Determinants of Health   Financial Resource Strain: Not  on file  Food Insecurity: Not on file  Transportation Needs: Not on file  Physical Activity: Not on file  Stress: Not on file  Social Connections: Not on file  Intimate Partner Violence: Not on file    FAMILY HISTORY: Family History  Problem Relation Age of Onset  . Cancer Mother        Pancreatic  . Hypertension Father   . Alzheimer's disease Father   . Gout Father   . Dementia Father   . Hypertension Sister   . Clotting disorder Sister   . Obesity Brother   . Bronchitis Brother   . Heart disease Maternal Grandmother   . Gout Paternal Grandfather     ALLERGIES:  is allergic to lisinopril, apple, entresto [sacubitril-valsartan], other, and shellfish allergy.  MEDICATIONS:  Current Outpatient Medications  Medication Sig Dispense Refill  . amiodarone (PACERONE) 200 MG tablet TAKE 1 TABLET(200 MG) BY MOUTH DAILY 90 tablet 3  . atorvastatin (LIPITOR) 20 MG tablet Take 1 tablet (20 mg total) by mouth daily. 90 tablet 3  . carvedilol (COREG) 25 MG tablet TAKE 1 TABLET(25 MG) BY MOUTH TWICE DAILY WITH A MEAL 180 tablet 3  . digoxin (LANOXIN) 0.125 MG tablet Take 1 tablet (0.125 mg total) by mouth daily. 90 tablet 3  . furosemide (LASIX) 80 MG tablet TAKE  1 TABLET BY MOUTH ON MONDAY, WEDNESDAY, AND FRIDAY. MAY ALSO TAKE 1 TABLET AS NEEDED FOR SWELLING 60 tablet 2  . hydroquinone 4 % cream Apply topically 2 (two) times daily. 50 g 1  . isosorbide-hydrALAZINE (BIDIL) 20-37.5 MG tablet Take 1 tablet by mouth 3 (three) times daily. 90 tablet 3  . losartan (COZAAR) 100 MG tablet Take 1 tablet (100 mg total) by mouth daily. 30 tablet 6  . nitroGLYCERIN (NITROSTAT) 0.4 MG SL tablet Place 1 tablet (0.4 mg total) under the tongue every 5 (five) minutes x 3 doses as needed for chest pain. 30 tablet 6  . potassium chloride SA (KLOR-CON) 20 MEQ tablet Take 2 tablets (40 mEq total) by mouth daily. 180 tablet 3  . sodium chloride (OCEAN) 0.65 % SOLN nasal spray Place 1 spray into both nostrils  daily as needed for congestion.    Marland Kitchen spironolactone (ALDACTONE) 25 MG tablet Take 1 tablet (25 mg total) by mouth daily. 90 tablet 3  . traZODone (DESYREL) 150 MG tablet Take 1 tablet (150 mg total) by mouth at bedtime. 90 tablet 0   No current facility-administered medications for this visit.     PHYSICAL EXAMINATION:  ECOG PERFORMANCE STATUS: 0 - Asymptomatic  Vitals:   03/07/20 1239  BP: (!) 155/80  Pulse: 79  Resp: 15  Temp: 98.1 F (36.7 C)  SpO2: 99%   Filed Weights   03/07/20 1239  Weight: 195 lb 6.4 oz (88.6 kg)    GENERAL:alert, no distress and comfortable, abdominal obesity. SKIN: skin color, texture, turgor are normal, no rashes or significant lesions EYES: normal, conjunctiva are pink and non-injected, sclera clear OROPHARYNX:no exudate, no erythema and lips, buccal mucosa, and tongue normal  NECK: supple, thyroid normal size, non-tender, without nodularity LYMPH:  no palpable lymphadenopathy in the cervical, axillary or inguinal LUNGS: clear to auscultation and percussion with normal breathing effort HEART: regular rate & rhythm and no murmurs and no lower extremity edema ABDOMEN:abdomen soft, non-tender and normal bowel sounds, midline hernia noted.  No hepatosplenomegaly. Musculoskeletal:no cyanosis of digits and no clubbing  PSYCH: alert & oriented x 3 with fluent speech NEURO: no focal motor/sensory deficits  LABORATORY DATA:  I have reviewed the data as listed Lab Results  Component Value Date   WBC 14.3 (H) 02/19/2020   HGB 14.2 02/19/2020   HCT 43.1 02/19/2020   MCV 97 02/19/2020   PLT 265 02/19/2020     Chemistry      Component Value Date/Time   NA 140 02/19/2020 1054   K 3.7 02/19/2020 1054   CL 98 02/19/2020 1054   CO2 20 02/19/2020 1054   BUN 18 02/19/2020 1054   CREATININE 1.26 02/19/2020 1054      Component Value Date/Time   CALCIUM 9.4 02/19/2020 1054   ALKPHOS 68 02/19/2020 1054   AST 24 02/19/2020 1054   ALT 20 02/19/2020  1054   BILITOT 0.6 02/19/2020 1054     I have reviewed pertinent labs He has had longstanding leukocytosis for several years.  Mostly neutrophilia with some occasional lymphocytosis.  I have not seen polycythemia, thrombocytosis or absolute basophilia.  RADIOGRAPHIC STUDIES: I have personally reviewed the radiological images as listed and agreed with the findings in the report. No results found.  All questions were answered. The patient knows to call the clinic with any problems, questions or concerns. I spent 45 minutes in the care of this patient including H and P, review of records, counseling and coordination of  care.     Benay Pike, MD 03/07/2020 1:04 PM

## 2020-03-07 NOTE — Telephone Encounter (Signed)
Scheduled per los. Gave avs and calendar  

## 2020-03-08 LAB — SURGICAL PATHOLOGY

## 2020-03-11 LAB — FLOW CYTOMETRY

## 2020-03-15 LAB — BCR ABL1 FISH (GENPATH)

## 2020-03-15 LAB — JAK2 (INCLUDING V617F AND EXON 12), MPL,& CALR W/RFL MPN PANEL (NGS)

## 2020-03-16 IMAGING — CR DG CHEST 2V
2 series · 2 of 2 positions shown · non-contrast
Comparison: Chest x-ray dated 03/21/2018.

CLINICAL DATA: Chest pain and shortness of breath.

EXAM:
CHEST - 2 VIEW

[x chest ap]
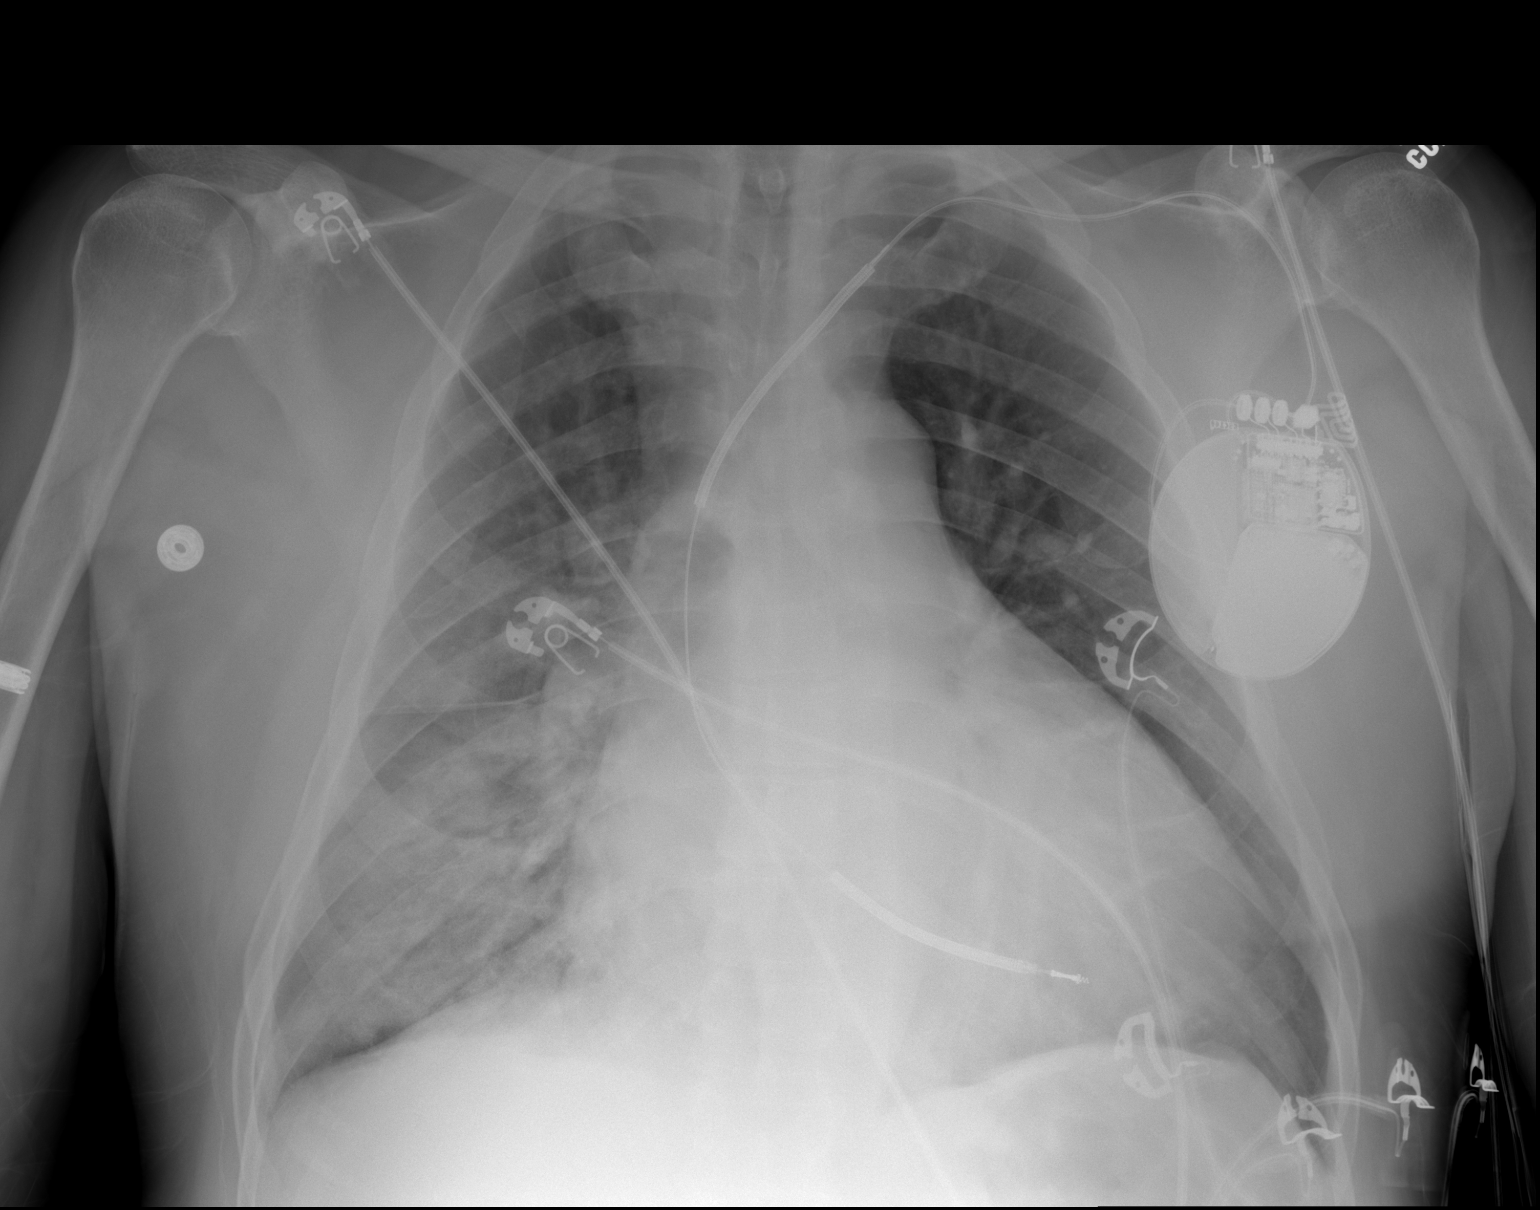

[w chest lat]
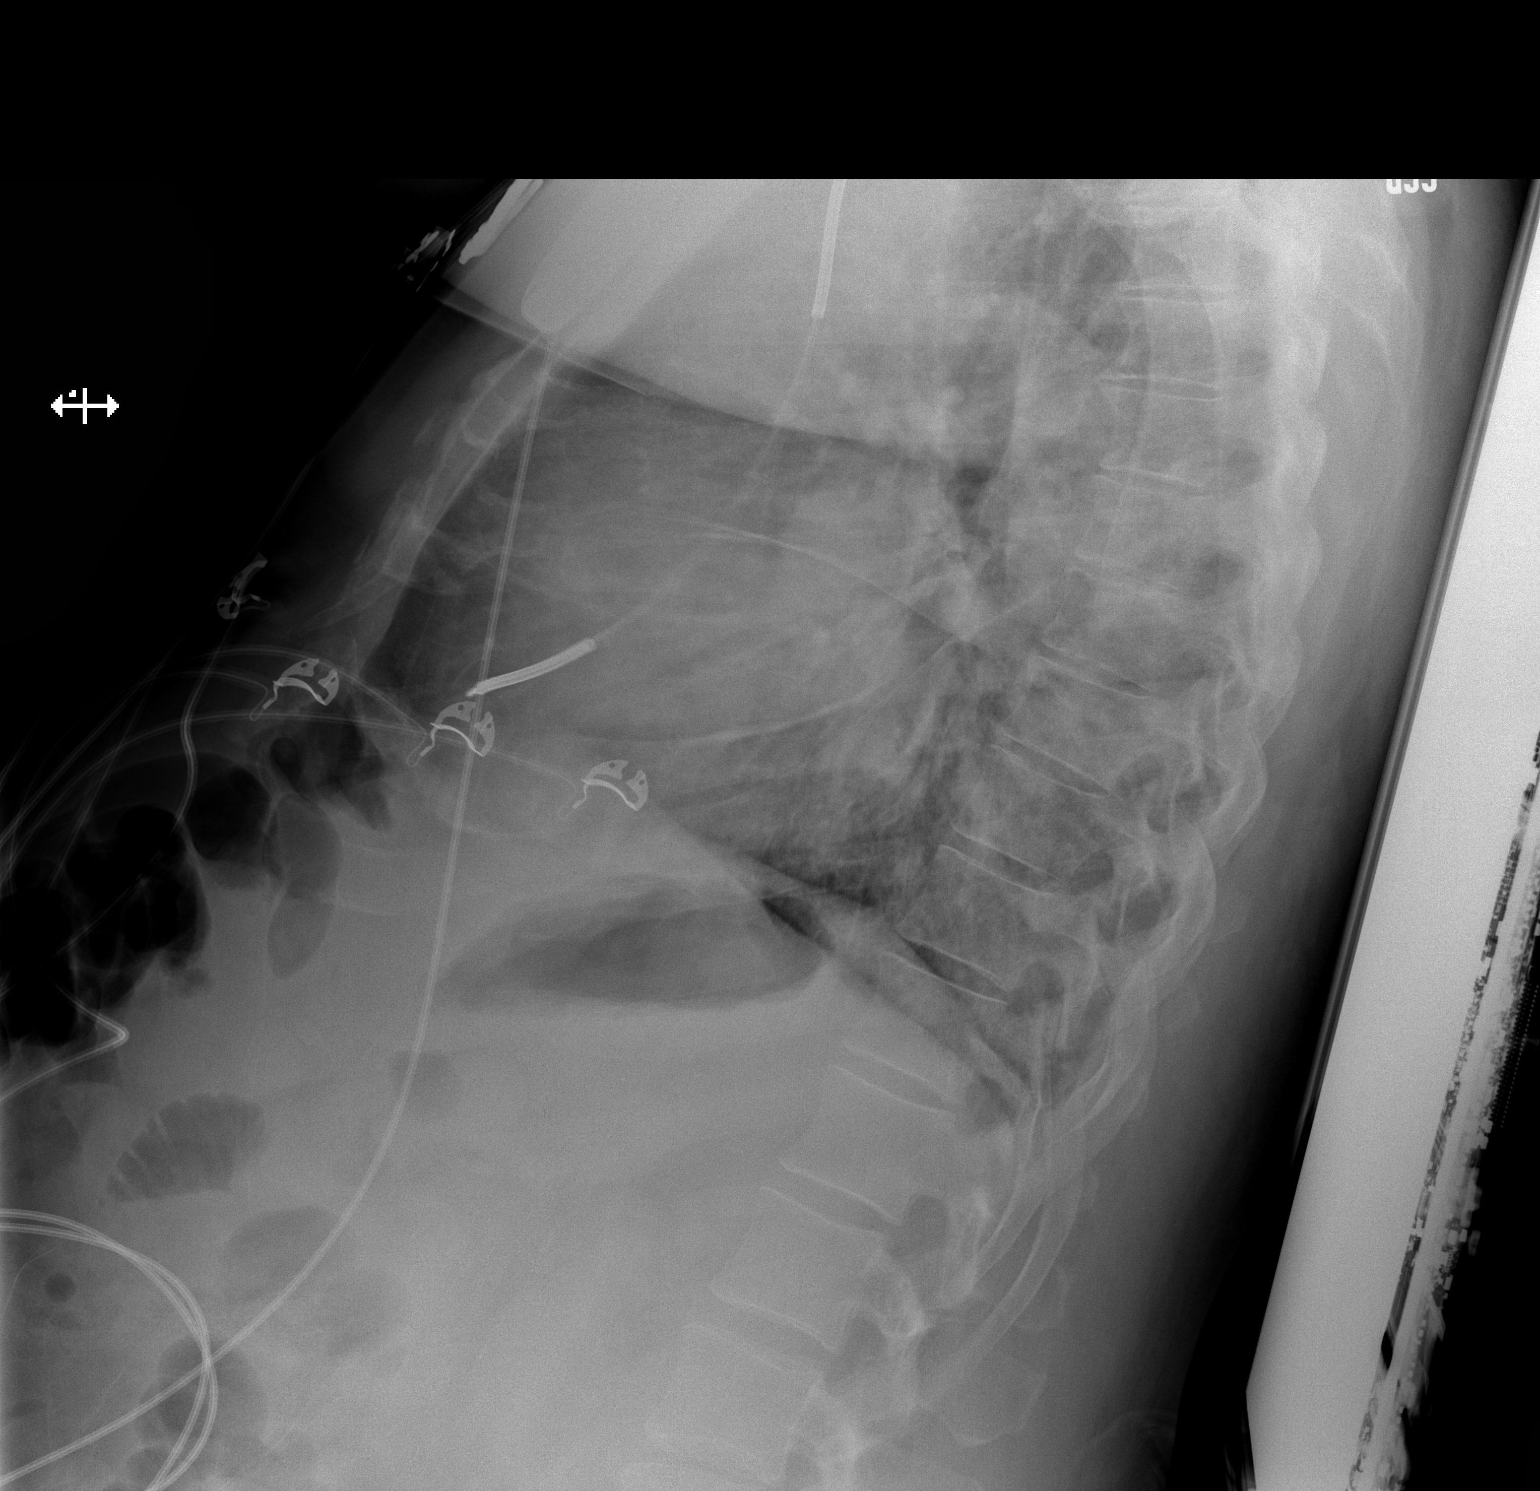

[2 of 2 positions shown; findings below may reference images not displayed]

FINDINGS: Dense opacity within the RIGHT lower lobe, consistent with
pneumonia. LEFT lung is clear. Stable cardiomegaly. LEFT chest wall
pacemaker/ICD hardware is stable in alignment. Osseous structures
about the chest are unremarkable.
IMPRESSION: RIGHT lower lobe pneumonia.

## 2020-03-19 ENCOUNTER — Encounter: Payer: Medicaid Other | Admitting: Student

## 2020-03-21 ENCOUNTER — Ambulatory Visit: Payer: Medicaid Other | Admitting: Nurse Practitioner

## 2020-03-22 ENCOUNTER — Other Ambulatory Visit: Payer: Self-pay

## 2020-03-22 ENCOUNTER — Encounter: Payer: Self-pay | Admitting: Nurse Practitioner

## 2020-03-22 ENCOUNTER — Ambulatory Visit: Payer: Medicaid Other | Attending: Nurse Practitioner | Admitting: Nurse Practitioner

## 2020-03-22 DIAGNOSIS — F5105 Insomnia due to other mental disorder: Secondary | ICD-10-CM | POA: Diagnosis not present

## 2020-03-22 DIAGNOSIS — F418 Other specified anxiety disorders: Secondary | ICD-10-CM | POA: Diagnosis not present

## 2020-03-22 DIAGNOSIS — R3911 Hesitancy of micturition: Secondary | ICD-10-CM

## 2020-03-22 DIAGNOSIS — N401 Enlarged prostate with lower urinary tract symptoms: Secondary | ICD-10-CM

## 2020-03-22 NOTE — Progress Notes (Signed)
Virtual Visit via Telephone Note Due to national recommendations of social distancing due to [name] 19, telehealth visit is felt to be most appropriate for this patient at this time.  I discussed the limitations, risks, security and privacy concerns of performing an evaluation and management service by telephone and the availability of in person appointments. I also discussed with the patient that there may be a patient responsible charge related to this service. The patient expressed understanding and agreed to proceed.    I connected with Stanley [name] on 03/22/20  at   2:30 PM EDT  EDT by telephone and verified that I am speaking with the correct person using two identifiers.   Consent I discussed the limitations, risks, security and privacy concerns of performing an evaluation and management service by telephone and the availability of in person appointments. I also discussed with the patient that there may be a patient responsible charge related to this service. The patient expressed understanding and agreed to proceed.   Location of Patient: Private Residence   Location of Provider: [place] participating in Telemedicine visit: [name] FNP-BC [place]    History of Present Illness: Telemedicine visit for: Insomnia He has a past medical history of Acute renal failure, Alcohol abuse, Anxiety, CAD, Chronic systolic CHF, Depression, Financial difficulties, Gout, History of noncompliance with medical treatment, HTN,  ICD, Insomnia, Lobar pneumonia (12/28/2018), MI (2005), Sleep apnea, and Ventricular tachycardia (01/2015).    He started back taking trazodone 150 mg. States it helps him sleep like a baby. He has no questions or concerns at this time.   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 Awaiting additional blood tests and has follow up in a few weeks with Dr. [name].        Past Medical History:  Diagnosis Date  . Acute renal failure (ARF) ([place])   . Acute respiratory failure with hypoxia ([place]) 12/29/2018  . Alcohol abuse   . Anxiety   . CAD (coronary artery disease)    a. dx unclear, reported PCI in 2011 but cath 2014 normal coronaries and cardiac CT 07/2016 normal coronaries, no mention of stents.  . Chronic chest pain   . Chronic systolic CHF (congestive heart failure) ([place])   . Depression   . Dyspnea    07/05/2019: per patient some times with exertion, "has to catch breath"  . Financial difficulties   . Gout   . Gouty arthritis   . Headache   . History of noncompliance with medical treatment    financial challenges  . Hypertension   . ICD (implantable cardioverter-defibrillator) in place    [name] implant done in [place] 2015  . Insomnia   . Lobar pneumonia ([place]) 12/28/2018  . Myocardial infarction ([name]) 2005  . Nonischemic cardiomyopathy ([name])   . Sleep apnea    per patient, has CPAP does not wear it every night  . Ventricular tachycardia ([place]) 01/2015   2017 - ATP delivered for sinus tach, degenerated into VT for which he received shock. Recurrent VT 03/2018.    Past Surgical History:  Procedure Laterality Date  . CARDIAC DEFIBRILLATOR PLACEMENT  06/05/13   [employer] Inogen ICD implanted at [facility] in [place] for primary prevention  . HERNIA REPAIR    . PERCUTANEOUS CORONARY STENT INTERVENTION (PCI-S)  2011    Family History  Problem Relation Age of Onset  . Cancer Mother  Pancreatic  . Hypertension Father   . Alzheimer's disease Father   . Gout Father   . Dementia Father   . Hypertension Sister   . Clotting disorder Sister   . Obesity Brother   . Bronchitis Brother   . Heart disease Maternal Grandmother   . Gout Paternal Grandfather     Social History   Socioeconomic History  . Marital status: Divorced    Spouse name: Not on file  . Number of children: 1  . Years of education: 19  .  Highest education level: Not on file  Occupational History  . Occupation: Disability  Tobacco Use  . Smoking status: Never Smoker  . Smokeless tobacco: Never Used  Vaping Use  . Vaping Use: Never used  Substance and Sexual Activity  . Alcohol use: Yes    Alcohol/week: 0.0 standard drinks    Comment: rarely  . Drug use: No  . Sexual activity: Not Currently  Other Topics Concern  . Not on file  Social History Narrative   Lives in [place] alone.  Disabled.  Previously worked as a Careers adviser.   Fun: Rest, Read and watch movies.    Social Determinants of Health   Financial Resource Strain: Not on file  Food Insecurity: Not on file  Transportation Needs: Not on file  Physical Activity: Not on file  Stress: Not on file  Social Connections: Not on file     Observations/Objective: Awake, alert and oriented x 3   Review of Systems  Constitutional: Negative for fever, malaise/fatigue and weight loss.  HENT: Negative.  Negative for nosebleeds.   Eyes: Negative.  Negative for blurred vision, double vision and photophobia.  Respiratory: Negative.  Negative for cough and shortness of breath.   Cardiovascular: Negative.  Negative for chest pain, palpitations and leg swelling.  Gastrointestinal: Negative.  Negative for heartburn, nausea and vomiting.  Genitourinary:       Hesitancy past several months along with ED.   Musculoskeletal: Negative.  Negative for myalgias.  Neurological: Negative.  Negative for dizziness, focal weakness, seizures and headaches.  Psychiatric/Behavioral: Negative for suicidal ideas. The patient has insomnia.     Assessment and Plan: [place] was seen today for insomnia and hypertension.  Diagnoses and all orders for this visit:  Insomnia secondary to depression with anxiety Continue trazodone as prescribed.   Urinary hesitancy due to benign prostatic hyperplasia -     Ambulatory referral to Urology     Follow Up Instructions Return  in about 3 months (around 06/22/2020).     I discussed the assessment and treatment plan with the patient. The patient was provided an opportunity to ask questions and all were answered. The patient agreed with the plan and demonstrated an understanding of the instructions.   The patient was advised to call back or seek an in-person evaluation if the symptoms worsen or if the condition fails to improve as anticipated.  I provided 11 minutes of non-face-to-face time during this encounter including median intraservice time, reviewing previous notes, labs, imaging, medications and explaining diagnosis and management.  Gildardo Pounds, FNP-BC

## 2020-03-27 ENCOUNTER — Ambulatory Visit (INDEPENDENT_AMBULATORY_CARE_PROVIDER_SITE_OTHER): Payer: Medicaid Other

## 2020-03-27 DIAGNOSIS — I428 Other cardiomyopathies: Secondary | ICD-10-CM | POA: Diagnosis not present

## 2020-03-28 ENCOUNTER — Encounter: Payer: Medicaid Other | Admitting: Physician Assistant

## 2020-03-28 LAB — CUP PACEART REMOTE DEVICE CHECK
Battery Remaining Longevity: 114 mo
Battery Remaining Percentage: 100 %
Brady Statistic RV Percent Paced: 0 %
Date Time Interrogation Session: 20220324113600
HighPow Impedance: 56 Ohm
Implantable Lead Implant Date: 20150601
Implantable Lead Location: 753860
Implantable Lead Model: 295
Implantable Lead Serial Number: 134196
Implantable Pulse Generator Implant Date: 20150601
Lead Channel Impedance Value: 429 Ohm
Lead Channel Pacing Threshold Amplitude: 1.5 V
Lead Channel Pacing Threshold Pulse Width: 0.4 ms
Lead Channel Setting Pacing Amplitude: 2.5 V
Lead Channel Setting Pacing Pulse Width: 0.4 ms
Lead Channel Setting Sensing Sensitivity: 0.6 mV
Pulse Gen Serial Number: 190689

## 2020-03-28 NOTE — Progress Notes (Deleted)
Cardiology Office Note Date:  03/28/2020  Patient ID:  Stanley [name], Stanley [name] February 16, 1965, MRN [id number] PCP:  Gildardo Pounds, NP  Cardiologist/AHF:  Dr. [name] Electrophysiologist: Dr. Rayann Heman  ***refresh   Chief Complaint: *** 67mo   History of Present Illness: Stanley [name] is a 55 y.o. male with history of CAD (PCI 2011 > cath 2014 without CAD, normal coronaries again by CT  2018), NICM (? HTN, ETOH), VT, ICD, chronic CP (unknown source), HTN, gout.  He comes in today to be seen for Dr. Rayann Heman.  He has been seen since the a few times by A. [place], [place] ,  the last Dec 2021, particularly for barostim titration.  Though his BP was elevated at that visit.  He was not taking his Bidil regularly, missing 2-3 days a week. Discussed adjustment in meds, though he preferred 1st to take his medicines as prescribed 1st and revisit his BP, Also discussed getting him back on track with the HF team.  He has early follow up, mentioned some occ stim noted with barostim device though not reproducible, no changes were made to the device, planned for q 58mo checks,  BP was much imporved   Today's visit is for an ICD check and BP eval.   *** Due to see [place] *** hx of angioedema, not ACE/ARB, entresto candidate *** amio labs *** meds, CM *** labs, lipids... *** volue    Device information BSCi single chamber ICD implanted 06/05/2013 Inappropriate therapy (ATP) for ST 2017 >> VT > multiple shocks Appropriate therapy 2020 in setting of medicine noncompliance  Barostim(standard)implanted 5/3/6144RXV Chronic systolic CHF  AAD Amiodarone started March 2020   Past Medical History:  Diagnosis Date  . Acute renal failure (ARF) ([place])   . Acute respiratory failure with hypoxia ([place]) 12/29/2018  . Alcohol abuse   . Anxiety   . CAD (coronary artery disease)    a. dx unclear, reported PCI in 2011 but cath 2014 normal coronaries and cardiac CT 07/2016 normal coronaries, no mention of  stents.  . Chronic chest pain   . Chronic systolic CHF (congestive heart failure) ([place])   . Depression   . Dyspnea    07/05/2019: per patient some times with exertion, "has to catch breath"  . Financial difficulties   . Gout   . Gouty arthritis   . Headache   . History of noncompliance with medical treatment    financial challenges  . Hypertension   . ICD (implantable cardioverter-defibrillator) in place    [place] SciICD implant done in [place] 2015  . Insomnia   . Lobar pneumonia ([place]) 12/28/2018  . Myocardial infarction ([place]) 2005  . Nonischemic cardiomyopathy ([place])   . Sleep apnea    per patient, has CPAP does not wear it every night  . Ventricular tachycardia ([place]) 01/2015   2017 - ATP delivered for sinus tach, degenerated into VT for which he received shock. Recurrent VT 03/2018.    Past Surgical History:  Procedure Laterality Date  . CARDIAC DEFIBRILLATOR PLACEMENT  06/05/13   [employer] Inogen ICD implanted at [facility] in [place] for primary prevention  . HERNIA REPAIR    . PERCUTANEOUS CORONARY STENT INTERVENTION (PCI-S)  2011    Current Outpatient Medications  Medication Sig Dispense Refill  . amiodarone (PACERONE) 200 MG tablet TAKE 1 TABLET(200 MG) BY MOUTH DAILY 90 tablet 3  . atorvastatin (LIPITOR) 20 MG tablet Take 1 tablet (20 mg total) by mouth daily. 90 tablet 3  .  carvedilol (COREG) 25 MG tablet TAKE 1 TABLET(25 MG) BY MOUTH TWICE DAILY WITH A MEAL 180 tablet 3  . digoxin (LANOXIN) 0.125 MG tablet Take 1 tablet (0.125 mg total) by mouth daily. 90 tablet 3  . furosemide (LASIX) 80 MG tablet TAKE 1 TABLET BY MOUTH ON MONDAY, WEDNESDAY, AND FRIDAY. MAY ALSO TAKE 1 TABLET AS NEEDED FOR SWELLING 60 tablet 2  . hydroquinone 4 % cream Apply topically 2 (two) times daily. 50 g 1  . isosorbide-hydrALAZINE (BIDIL) 20-37.5 MG tablet Take 1 tablet by mouth 3 (three) times daily. 90 tablet 3  . losartan (COZAAR) 100 MG tablet Take 1 tablet (100 mg total) by  mouth daily. 30 tablet 6  . nitroGLYCERIN (NITROSTAT) 0.4 MG SL tablet Place 1 tablet (0.4 mg total) under the tongue every 5 (five) minutes x 3 doses as needed for chest pain. 30 tablet 6  . potassium chloride SA (KLOR-CON) 20 MEQ tablet Take 2 tablets (40 mEq total) by mouth daily. 180 tablet 3  . sodium chloride (OCEAN) 0.65 % SOLN nasal spray Place 1 spray into both nostrils daily as needed for congestion.    Marland Kitchen spironolactone (ALDACTONE) 25 MG tablet Take 1 tablet (25 mg total) by mouth daily. 90 tablet 3  . traZODone (DESYREL) 150 MG tablet Take 1 tablet (150 mg total) by mouth at bedtime. 90 tablet 0   No current facility-administered medications for this visit.    Allergies:   Lisinopril, Apple, Entresto [sacubitril-valsartan], Other, and Shellfish allergy   Social History:  The patient  reports that he has never smoked. He has never used smokeless tobacco. He reports current alcohol use. He reports that he does not use drugs.   Family History:  The patient's family history includes Alzheimer's disease in his father; Bronchitis in his brother; Cancer in his mother; Clotting disorder in his sister; Dementia in his father; Gout in his father and paternal grandfather; Heart disease in his maternal grandmother; Hypertension in his father and sister; Obesity in his brother.  ROS:  Please see the history of present illness.    All other systems are reviewed and otherwise negative.   PHYSICAL EXAM:  VS:  There were no vitals taken for this visit. BMI: There is no height or weight on file to calculate BMI. Well nourished, well developed, in no acute distress HEENT: normocephalic, atraumatic Neck: no JVD, carotid bruits or masses Cardiac:  *** RRR; no significant murmurs, no rubs, or gallops Lungs:  *** CTA b/l, no wheezing, rhonchi or rales Abd: soft, nontender MS: no deformity or *** atrophy Ext: *** no edema Skin: warm and dry, no rash Neuro:  No gross deficits appreciated Psych:  euthymic mood, full affect  *** ICD *** and barostim sites are stable, no tethering or discomfort   EKG:  Not done today  Device interrogation done today and reviewed by myself:  ***  03/10/2019: TTE IMPRESSIONS  1. Left ventricular ejection fraction, by estimation, is 25 to 30%. The  left ventricle has severely decreased function. The left ventricle  demonstrates global hypokinesis. The left ventricular internal cavity size  was mildly dilated. Left ventricular  diastolic parameters are consistent with Grade I diastolic dysfunction  (impaired relaxation). Elevated left atrial pressure.  2. Right ventricular systolic function is normal. The right ventricular  size is normal. There is mildly elevated pulmonary artery systolic  pressure. The estimated right ventricular systolic pressure is 75.9 mmHg.  3. The mitral valve is normal in structure and function.  Mild mitral  valve regurgitation. No evidence of mitral stenosis.  4. The aortic valve is normal in structure and function. Aortic valve  regurgitation is not visualized. No aortic stenosis is present.  5. The inferior vena cava is normal in size with greater than 50%  respiratory variability, suggesting right atrial pressure of 3 mmHg.    CPX 09/19/18 FVC 1.96 (57%)    FEV1 1.60 (58%)     FEV1/FVC 81 (101%)     MVV 80 (61%)  Resting HR: 77 Peak HR: 131  (79% age predicted max HR)  BP rest: 168/80     Standing BP: 156/74 BP peak: 196/80  Peak VO2: 14.9 (52% predicted peak VO2)  VE/VCO2 slope: 31  OUES: 1.43  Peak RER: 1.05  VE/MVV: 50% O2pulse: 10  (67% predicted O2pulse)   Moderate to severe HF limitation but normal VeVCO2 slope is reassuring. Restrictive lung physiology.    7/112018: Coronary CT IMPRESSION: 1) Normal right dominant coronary arteries 2) Calcium score 0   Recent Labs: 12/06/2019: TSH 0.542 03/07/2020: ALT 19; BUN 19; Creatinine 1.54; Hemoglobin 12.7; Platelets 250; Potassium  3.1; Sodium 141  02/19/2020: Chol/HDL Ratio 4.7; Cholesterol, Total 273; HDL 58; LDL Chol Calc (NIH) 171; Triglycerides 234   CrCl cannot be calculated (Unknown ideal weight.).   Wt Readings from Last 3 Encounters:  03/07/20 195 lb 6.4 oz (88.6 kg)  02/19/20 195 lb (88.5 kg)  12/26/19 193 lb (87.5 kg)     Other studies reviewed: Additional studies/records reviewed today include: summarized above  ASSESSMENT AND PLAN:  1. ICD     ***  2. VT     On amiodarone     ***  3. NICM 4. Chronic CHF (sy[place])      Barostim implant 07/2019      H/o angioedema, not ACE/ARB/Entresto candidate      ***  5. CAD (?)     Non by cath or coronary CT  6. Chronic CP     Unclear source     ***  7. HTN     ***  8. Medication noncompliance     ***  Disposition: F/u with ***  Current medicines are reviewed at length with the patient today.  The patient did not have any concerns regarding medicines.  Venetia Night, PA-C 03/28/2020 5:53 AM     [address] [place] [address] [phone] (office)  [phone] (fax)

## 2020-04-08 NOTE — Progress Notes (Signed)
Remote ICD transmission.   

## 2020-04-09 ENCOUNTER — Encounter: Payer: Self-pay | Admitting: Hematology and Oncology

## 2020-04-09 ENCOUNTER — Inpatient Hospital Stay: Payer: Medicaid Other | Attending: Hematology and Oncology | Admitting: Hematology and Oncology

## 2020-04-09 ENCOUNTER — Other Ambulatory Visit: Payer: Self-pay

## 2020-04-09 VITALS — BP 139/81 | HR 91 | Temp 97.8°F | Resp 20 | Ht 65.0 in | Wt 186.3 lb

## 2020-04-09 DIAGNOSIS — D72829 Elevated white blood cell count, unspecified: Secondary | ICD-10-CM | POA: Diagnosis present

## 2020-04-09 DIAGNOSIS — R079 Chest pain, unspecified: Secondary | ICD-10-CM | POA: Insufficient documentation

## 2020-04-09 DIAGNOSIS — Z800 Family history of malignant neoplasm of digestive organs: Secondary | ICD-10-CM

## 2020-04-09 DIAGNOSIS — R194 Change in bowel habit: Secondary | ICD-10-CM | POA: Diagnosis not present

## 2020-04-09 NOTE — Progress Notes (Signed)
[place] NOTE  Patient Care Team: Gildardo Pounds, NP as PCP - General (Nurse Practitioner) [name], Shaune Pascal, MD as PCP - Cardiology (Cardiology) Thompson Grayer, MD as PCP - Electrophysiology (Cardiology)  CHIEF COMPLAINTS/PURPOSE OF CONSULTATION:   Leukocytosis, lymphocytosis  ASSESSMENT & PLAN:   No problem-specific Assessment & Plan notes found for this encounter.  No orders of the defined types were placed in this encounter.  This is a very pleasant 55 year old male patient with past medical history significant for coronary artery disease, congestive heart failure, hypertension, insomnia, myocardial infarction referred to hematology for evaluation of persistent leukocytosis. He is here to review labs from last visit and to discuss any additional recommendations.  Flow cytometry unremarkable, MPN work-up negative, no evidence of BCR ABL rearrangement.  At this time, I have less suspicion for this leukocytosis to be related to a primary bone marrow process.  He does have chronic gout related pain which could be contributing to the leukocytosis.  I recommended that he come back to see [place] in about a year and he can continue to follow-up with his PCP and his other specialist in the meantime.  2.  Change in bowel habits, he has a colonoscopy scheduled in a couple weeks and he will follow up with his gastroenterologist.  3.  Family history of pancreatic cancer in mom.  I recommended genetic testing he agrees to the referral.  Referral has been placed.  #4.  Chest pain radiating to left arm, intermittent, recommended that he immediately contact his cardiologist regarding this issue.  He expressed understanding.  HISTORY OF PRESENTING ILLNESS:  Stanley [name] 55 y.o. male is here because of leukocytosis.  This is a very pleasant 55 year old male patient with past medical history significant for coronary artery disease, myocardial infarction gout, hypertension  referred to hematology for evaluation of persistent leukocytosis.   No erythromelalgia, intractable pruritus, personal history of thromboembolic episodes.  He is a non-smoker.  He drinks about 3 glasses of wine every weekend but previously used to drink a lot more.  Interim history  He is here for follow-up.  He is not doing very well today.  He has been having some loose stool and he has a colonoscopy upcoming.  He feels tired, has his cough and he feels some chest pressure with pain radiating to the left arm, has an AICD in place which has been evaluated a month ago.  He has chronic gout related pain.  Overall he does not feel very well today.  Rest of the pertinent 10 point ROS as mentioned above and unremarkable.  REVIEW OF SYSTEMS:   Constitutional: Denies fevers, chills or abnormal night sweats Eyes: Denies blurriness of vision, double vision or watery eyes Ears, nose, mouth, throat, and face: Denies mucositis or sore throat Respiratory: Denies cough, dyspnea or wheezes Cardiovascular: Denies palpitation, chest discomfort or lower extremity swelling Gastrointestinal:  Denies nausea, heartburn or change in bowel habits Skin: Denies abnormal skin rashes Lymphatics: Denies new lymphadenopathy or easy bruising Neurological:Denies numbness, tingling or new weaknesses Behavioral/Psych: Mood is stable, no new changes  All other systems were reviewed with the patient and are negative.  MEDICAL HISTORY:  Past Medical History:  Diagnosis Date  . Acute renal failure (ARF) ([place])   . Acute respiratory failure with hypoxia ([place]) 12/29/2018  . Alcohol abuse   . Anxiety   . CAD (coronary artery disease)    a. dx unclear, reported PCI in 2011 but cath 2014 normal coronaries  and cardiac CT 07/2016 normal coronaries, no mention of stents.  . Chronic chest pain   . Chronic systolic CHF (congestive heart failure) ([place])   . Depression   . Dyspnea    07/05/2019: per patient some times with exertion,  "has to catch breath"  . Financial difficulties   . Gout   . Gouty arthritis   . Headache   . History of noncompliance with medical treatment    financial challenges  . Hypertension   . ICD (implantable cardioverter-defibrillator) in place    [place] SciICD implant done in [place] 2015  . Insomnia   . Lobar pneumonia ([place]) 12/28/2018  . Myocardial infarction ([place]) 2005  . Nonischemic cardiomyopathy ([place])   . Sleep apnea    per patient, has CPAP does not wear it every night  . Ventricular tachycardia ([place]) 01/2015   2017 - ATP delivered for sinus tach, degenerated into VT for which he received shock. Recurrent VT 03/2018.    SURGICAL HISTORY: Past Surgical History:  Procedure Laterality Date  . CARDIAC DEFIBRILLATOR PLACEMENT  06/05/13   [employer] Inogen ICD implanted at [facility] in [place] for primary prevention  . HERNIA REPAIR    . PERCUTANEOUS CORONARY STENT INTERVENTION (PCI-S)  2011    SOCIAL HISTORY: Social History   Socioeconomic History  . Marital status: Divorced    Spouse name: Not on file  . Number of children: 1  . Years of education: 62  . Highest education level: Not on file  Occupational History  . Occupation: Disability  Tobacco Use  . Smoking status: Never Smoker  . Smokeless tobacco: Never Used  Vaping Use  . Vaping Use: Never used  Substance and Sexual Activity  . Alcohol use: Yes    Alcohol/week: 0.0 standard drinks    Comment: rarely  . Drug use: No  . Sexual activity: Not Currently  Other Topics Concern  . Not on file  Social History Narrative   Lives in [place] alone.  Disabled.  Previously worked as a Careers adviser.   Fun: Rest, Read and watch movies.    Social Determinants of Health   Financial Resource Strain: Not on file  Food Insecurity: Not on file  Transportation Needs: Not on file  Physical Activity: Not on file  Stress: Not on file  Social Connections: Not on file  Intimate Partner Violence: Not on  file    FAMILY HISTORY: Family History  Problem Relation Age of Onset  . Cancer Mother        Pancreatic  . Hypertension Father   . Alzheimer's disease Father   . Gout Father   . Dementia Father   . Hypertension Sister   . Clotting disorder Sister   . Obesity Brother   . Bronchitis Brother   . Heart disease Maternal Grandmother   . Gout Paternal Grandfather     ALLERGIES:  is allergic to lisinopril, apple, entresto [sacubitril-valsartan], other, and shellfish allergy.  MEDICATIONS:  Current Outpatient Medications  Medication Sig Dispense Refill  . amiodarone (PACERONE) 200 MG tablet TAKE 1 TABLET(200 MG) BY MOUTH DAILY 90 tablet 3  . atorvastatin (LIPITOR) 20 MG tablet Take 1 tablet (20 mg total) by mouth daily. 90 tablet 3  . carvedilol (COREG) 25 MG tablet TAKE 1 TABLET(25 MG) BY MOUTH TWICE DAILY WITH A MEAL 180 tablet 3  . digoxin (LANOXIN) 0.125 MG tablet Take 1 tablet (0.125 mg total) by mouth daily. 90 tablet 3  . furosemide (LASIX) 80  MG tablet TAKE 1 TABLET BY MOUTH ON MONDAY, WEDNESDAY, AND FRIDAY. MAY ALSO TAKE 1 TABLET AS NEEDED FOR SWELLING 60 tablet 2  . hydroquinone 4 % cream Apply topically 2 (two) times daily. 50 g 1  . isosorbide-hydrALAZINE (BIDIL) 20-37.5 MG tablet Take 1 tablet by mouth 3 (three) times daily. 90 tablet 3  . losartan (COZAAR) 100 MG tablet Take 1 tablet (100 mg total) by mouth daily. 30 tablet 6  . potassium chloride SA (KLOR-CON) 20 MEQ tablet Take 2 tablets (40 mEq total) by mouth daily. 180 tablet 3  . sodium chloride (OCEAN) 0.65 % SOLN nasal spray Place 1 spray into both nostrils daily as needed for congestion.    Marland Kitchen spironolactone (ALDACTONE) 25 MG tablet Take 1 tablet (25 mg total) by mouth daily. 90 tablet 3  . traZODone (DESYREL) 150 MG tablet Take 1 tablet (150 mg total) by mouth at bedtime. 90 tablet 0  . nitroGLYCERIN (NITROSTAT) 0.4 MG SL tablet Place 1 tablet (0.4 mg total) under the tongue every 5 (five) minutes x 3 doses as  needed for chest pain. 30 tablet 6   No current facility-administered medications for this visit.     PHYSICAL EXAMINATION:  ECOG PERFORMANCE STATUS: 0 - Asymptomatic  Vitals:   04/09/20 1414  BP: 139/81  Pulse: 91  Resp: 20  Temp: 97.8 F (36.6 C)  SpO2: 99%   Filed Weights   04/09/20 1414  Weight: 186 lb 4.8 oz (84.5 kg)    GENERAL:alert, no distress and comfortable, abdominal obesity. SKIN: skin color, texture, turgor are normal, no rashes or significant lesions EYES: normal, conjunctiva are pink and non-injected, sclera clear OROPHARYNX:no exudate, no erythema and lips, buccal mucosa, and tongue normal  NECK: supple, thyroid normal size, non-tender, without nodularity LYMPH:  no palpable lymphadenopathy in the cervical, axillary or inguinal LUNGS: clear to auscultation and percussion with normal breathing effort HEART: regular rate & rhythm and no murmurs and no lower extremity edema ABDOMEN:abdomen soft, non-tender and normal bowel sounds, midline hernia noted.  No hepatosplenomegaly. Musculoskeletal:no cyanosis of digits and no clubbing  PSYCH: alert & oriented x 3 with fluent speech NEURO: no focal motor/sensory deficits  LABORATORY DATA:  I have reviewed the data as listed Lab Results  Component Value Date   WBC 13.6 (H) 03/07/2020   HGB 12.7 (L) 03/07/2020   HCT 38.5 (L) 03/07/2020   MCV 98.2 03/07/2020   PLT 250 03/07/2020     Chemistry      Component Value Date/Time   NA 141 03/07/2020 1355   NA 140 02/19/2020 1054   K 3.1 (L) 03/07/2020 1355   CL 105 03/07/2020 1355   CO2 23 03/07/2020 1355   BUN 19 03/07/2020 1355   BUN 18 02/19/2020 1054   CREATININE 1.54 (H) 03/07/2020 1355      Component Value Date/Time   CALCIUM 9.5 03/07/2020 1355   ALKPHOS 66 03/07/2020 1355   AST 25 03/07/2020 1355   ALT 19 03/07/2020 1355   BILITOT 0.5 03/07/2020 1355     Reviewed his myeloproliferative work-up with him today.  Flow is negative.  Persistent mild  leukocytosis likely reactive since it is mostly neutrophilia.  Mild anemia which has essentially remained stable  RADIOGRAPHIC STUDIES: I have personally reviewed the radiological images as listed and agreed with the findings in the report. CUP PACEART REMOTE DEVICE CHECK  Result Date: 03/28/2020 Scheduled remote reviewed. Normal device function.  Next remote 91 days. Kathy Breach, RN,  CCDS, [employer]   All questions were answered. The patient knows to call the clinic with any problems, questions or concerns. I spent 30 minutes in the care of this patient including H and P, review of records, counseling and coordination of care.     Benay Pike, MD 04/09/2020 2:48 PM

## 2020-04-10 ENCOUNTER — Telehealth: Payer: Self-pay | Admitting: Hematology and Oncology

## 2020-04-10 NOTE — Telephone Encounter (Signed)
Scheduled appt per 4/6 sch msg. Left vm with appt date and time.

## 2020-04-18 ENCOUNTER — Encounter: Payer: Medicaid Other | Admitting: Genetic Counselor

## 2020-04-18 ENCOUNTER — Other Ambulatory Visit: Payer: Medicaid Other

## 2020-04-23 ENCOUNTER — Encounter: Payer: Medicaid Other | Admitting: Internal Medicine

## 2020-04-23 ENCOUNTER — Ambulatory Visit (INDEPENDENT_AMBULATORY_CARE_PROVIDER_SITE_OTHER): Payer: Medicaid Other | Admitting: Gastroenterology

## 2020-04-23 ENCOUNTER — Encounter: Payer: Self-pay | Admitting: Gastroenterology

## 2020-04-23 VITALS — BP 116/70 | HR 60 | Ht 65.0 in | Wt 184.8 lb

## 2020-04-23 DIAGNOSIS — Z1211 Encounter for screening for malignant neoplasm of colon: Secondary | ICD-10-CM | POA: Insufficient documentation

## 2020-04-23 DIAGNOSIS — K219 Gastro-esophageal reflux disease without esophagitis: Secondary | ICD-10-CM | POA: Insufficient documentation

## 2020-04-23 MED ORDER — PLENVU 140 G PO SOLR: 140.0000 g | ORAL | 0 refills | Status: DC

## 2020-04-23 MED ORDER — PANTOPRAZOLE SODIUM 40 MG PO TBEC: 40.0000 mg | DELAYED_RELEASE_TABLET | Freq: Every day | ORAL | 5 refills | Status: DC

## 2020-04-23 NOTE — Patient Instructions (Addendum)
If you are age 57 or older, your body mass index should be between 23-30. Your Body mass index is 30.75 kg/m. If this is out of the aforementioned range listed, please consider follow up with your Primary Care Provider.  If you are age 60 or younger, your body mass index should be between 19-25. Your Body mass index is 30.75 kg/m. If this is out of the aformentioned range listed, please consider follow up with your Primary Care Provider.   You have been scheduled for a colonoscopy/EGD. Please follow written instructions given to you at your visit today.  Please pick up your prep supplies at the pharmacy within the next 1-3 days. If you use inhalers (even only as needed), please bring them with you on the day of your procedure.  Thank you for choosing me and [facility].  Alonza Bogus, PA-C

## 2020-04-23 NOTE — Progress Notes (Signed)
04/23/2020 Stanley [name] [id number] Jan 26, 1965   HISTORY OF PRESENT ILLNESS: This is a 55 year old male who is new to our office.  He was referred here by his PCP, [name], NP, in order to discuss colon cancer screening.  Patient tells me that he had a colonoscopy in 2012 in [place], [place].  He would really like to have a colonoscopy performed.  He has history of chronic systolic CHF for which he has an ICD in place and has a Barostim in place.  He denies any significant bowel issues, but complains of heartburn/reflux/indigestion along with epigastric abdominal pain.  He is concerned about a stomach ulcer.  He said he has taken over-the-counter Nexium in the past, but it did not seem to help.  He is not currently on any type of acid reflux medication.  He reports some new chest pain, which he thinks may be a component of acid reflux.  He reported this to hematology when he was seen there a couple weeks ago and they asked him to contact his cardiologist immediately to make them aware.  He has not yet done so.  Past Medical History:  Diagnosis Date  . Acute renal failure (ARF) ([place])   . Acute respiratory failure with hypoxia ([place]) 12/29/2018  . Alcohol abuse   . Anxiety   . CAD (coronary artery disease)    a. dx unclear, reported PCI in 2011 but cath 2014 normal coronaries and cardiac CT 07/2016 normal coronaries, no mention of stents.  . Chronic chest pain   . Chronic systolic CHF (congestive heart failure) ([place])   . Depression   . Dyspnea    07/05/2019: per patient some times with exertion, "has to catch breath"  . Financial difficulties   . Gout   . Gouty arthritis   . Headache   . History of noncompliance with medical treatment    financial challenges  . Hypertension   . ICD (implantable cardioverter-defibrillator) in place    [place] SciICD implant done in [place] 2015  . Insomnia   . Lobar pneumonia ([place]) 12/28/2018  . Myocardial infarction ([place]) 2005  . Nonischemic  cardiomyopathy ([place])   . Sleep apnea    per patient, has CPAP does not wear it every night  . Ventricular tachycardia ([place]) 01/2015   2017 - ATP delivered for sinus tach, degenerated into VT for which he received shock. Recurrent VT 03/2018.   Past Surgical History:  Procedure Laterality Date  . CARDIAC DEFIBRILLATOR PLACEMENT  06/05/13   [employer] Inogen ICD implanted at [facility] in [place] for primary prevention  . HERNIA REPAIR    . PERCUTANEOUS CORONARY STENT INTERVENTION (PCI-S)  2011    reports that he has never smoked. He has never used smokeless tobacco. He reports current alcohol use. He reports that he does not use drugs. family history includes Alzheimer's disease in his father; Bronchitis in his brother; Cancer in his mother; Clotting disorder in his sister; Dementia in his father; Gout in his father and paternal grandfather; Heart disease in his maternal grandmother; Hypertension in his father and sister; Obesity in his brother. Allergies  Allergen Reactions  . Lisinopril Shortness Of Breath    Makes lips swell  . Apple Swelling    Lip Swelling  . Entresto [Sacubitril-Valsartan]     History of angioedema  . Other Swelling    Nuts - Lip Swelling  . Shellfish Allergy Swelling    Lip Swelling  Outpatient Encounter Medications as of 04/23/2020  Medication Sig  . amiodarone (PACERONE) 200 MG tablet TAKE 1 TABLET(200 MG) BY MOUTH DAILY  . atorvastatin (LIPITOR) 20 MG tablet Take 1 tablet (20 mg total) by mouth daily.  . carvedilol (COREG) 25 MG tablet TAKE 1 TABLET(25 MG) BY MOUTH TWICE DAILY WITH A MEAL  . digoxin (LANOXIN) 0.125 MG tablet Take 1 tablet (0.125 mg total) by mouth daily.  . furosemide (LASIX) 80 MG tablet TAKE 1 TABLET BY MOUTH ON MONDAY, WEDNESDAY, AND FRIDAY. MAY ALSO TAKE 1 TABLET AS NEEDED FOR SWELLING  . hydroquinone 4 % cream Apply topically 2 (two) times daily.  . isosorbide-hydrALAZINE (BIDIL) 20-37.5 MG tablet Take 1 tablet  by mouth 3 (three) times daily.  Marland Kitchen losartan (COZAAR) 100 MG tablet Take 1 tablet (100 mg total) by mouth daily.  . nitroGLYCERIN (NITROSTAT) 0.4 MG SL tablet Place 1 tablet (0.4 mg total) under the tongue every 5 (five) minutes x 3 doses as needed for chest pain.  . potassium chloride SA (KLOR-CON) 20 MEQ tablet Take 2 tablets (40 mEq total) by mouth daily.  . sodium chloride (OCEAN) 0.65 % SOLN nasal spray Place 1 spray into both nostrils daily as needed for congestion.  Marland Kitchen spironolactone (ALDACTONE) 25 MG tablet Take 1 tablet (25 mg total) by mouth daily.  . traZODone (DESYREL) 150 MG tablet Take 1 tablet (150 mg total) by mouth at bedtime.   No facility-administered encounter medications on file as of 04/23/2020.     REVIEW OF SYSTEMS  : All other systems reviewed and negative except where noted in the History of Present Illness.   PHYSICAL EXAM: BP 116/70   Pulse 60   Ht 5\' 5"  (1.651 m)   Wt 184 lb 12.8 oz (83.8 kg)   BMI 30.75 kg/m  General: Well developed AA male in no acute distress Head: Normocephalic and atraumatic Eyes:  Sclerae anicteric, conjunctiva pink. Ears: Normal auditory acuity Lungs: Clear throughout to auscultation; no W/R/R. Heart: Regular rate and rhythm; no M/R/G. Abdomen: Soft, non-distended.  BS present.  Ventral hernia/diastasis recti noted.  Mild epigastric TTP. Rectal:  Will be done at the time of colonoscopy. Musculoskeletal: Symmetrical with no gross deformities  Skin: No lesions on visible extremities Extremities: No edema  Neurological: Alert oriented x 4, grossly non-focal Psychological:  Alert and cooperative. Normal mood and affect  ASSESSMENT AND PLAN: *Colorectal cancer screening: He tells me that his last colonoscopy was 10 years ago in [place], [place].  We will plan for colonoscopy at [facility] with Dr. Havery Moros. *GERD, chest pain, intermittent dysphagia: Says that over-the-counter Nexium did not help previously.  Is not  currently on any acid reflux medication.  Will prescribe pantoprazole 40 mg daily.  We will tentatively schedule EGD at [facility] with Dr. Havery Moros as well.  Prescription sent to pharmacy. *Chronic systolic CHF status post Barostim and defibrillator, EF of 25-30%.  Patient has not been seen by cardiology since 12/2019.  I have reached out to them and they will contact the patient for appt to be seen soon, prior to his scheduled endoscopic procedures.  **The risks, benefits, and alternatives to EGD and colonoscopy were discussed with the patient and he consents to proceed.   CC:  Gildardo Pounds, NP

## 2020-04-23 NOTE — Progress Notes (Signed)
Agree with assessment and plan as outlined.  

## 2020-05-01 ENCOUNTER — Ambulatory Visit (INDEPENDENT_AMBULATORY_CARE_PROVIDER_SITE_OTHER): Payer: Medicaid Other | Admitting: Student

## 2020-05-01 ENCOUNTER — Telehealth: Payer: Self-pay | Admitting: Internal Medicine

## 2020-05-01 ENCOUNTER — Encounter: Payer: Self-pay | Admitting: Student

## 2020-05-01 ENCOUNTER — Other Ambulatory Visit: Payer: Self-pay

## 2020-05-01 VITALS — BP 118/78 | HR 67 | Ht 65.0 in | Wt 180.0 lb

## 2020-05-01 DIAGNOSIS — I519 Heart disease, unspecified: Secondary | ICD-10-CM

## 2020-05-01 DIAGNOSIS — E876 Hypokalemia: Secondary | ICD-10-CM

## 2020-05-01 DIAGNOSIS — I428 Other cardiomyopathies: Secondary | ICD-10-CM | POA: Diagnosis not present

## 2020-05-01 DIAGNOSIS — R079 Chest pain, unspecified: Secondary | ICD-10-CM

## 2020-05-01 DIAGNOSIS — I10 Essential (primary) hypertension: Secondary | ICD-10-CM | POA: Diagnosis not present

## 2020-05-01 DIAGNOSIS — E079 Disorder of thyroid, unspecified: Secondary | ICD-10-CM

## 2020-05-01 DIAGNOSIS — R7989 Other specified abnormal findings of blood chemistry: Secondary | ICD-10-CM

## 2020-05-01 LAB — COMPREHENSIVE METABOLIC PANEL
ALT: 32 IU/L (ref 0–44)
AST: 29 IU/L (ref 0–40)
Albumin/Globulin Ratio: 1.2 (ref 1.2–2.2)
Albumin: 4.3 g/dL (ref 3.8–4.9)
Alkaline Phosphatase: 90 IU/L (ref 44–121)
BUN/Creatinine Ratio: 13 (ref 9–20)
BUN: 15 mg/dL (ref 6–24)
Bilirubin Total: 0.7 mg/dL (ref 0.0–1.2)
CO2: 22 mmol/L (ref 20–29)
Calcium: 9.6 mg/dL (ref 8.7–10.2)
Chloride: 99 mmol/L (ref 96–106)
Creatinine, Ser: 1.13 mg/dL (ref 0.76–1.27)
Globulin, Total: 3.5 g/dL (ref 1.5–4.5)
Glucose: 107 mg/dL — ABNORMAL HIGH (ref 65–99)
Potassium: 2.9 mmol/L — CL (ref 3.5–5.2)
Sodium: 139 mmol/L (ref 134–144)
Total Protein: 7.8 g/dL (ref 6.0–8.5)
eGFR: 76 mL/min/{1.73_m2} (ref >59)

## 2020-05-01 LAB — CBC
Hematocrit: 37.6 % (ref 37.5–51.0)
Hemoglobin: 13.4 g/dL (ref 13.0–17.7)
MCH: 33.9 pg — ABNORMAL HIGH (ref 26.6–33.0)
MCHC: 35.6 g/dL (ref 31.5–35.7)
MCV: 95 fL (ref 79–97)
Platelets: 287 10*3/uL (ref 150–450)
RBC: 3.95 x10E6/uL — ABNORMAL LOW (ref 4.14–5.80)
RDW: 12 % (ref 11.6–15.4)
WBC: 15.5 10*3/uL — ABNORMAL HIGH (ref 3.4–10.8)

## 2020-05-01 LAB — TSH: TSH: 0.268 u[IU]/mL — ABNORMAL LOW (ref 0.450–4.500)

## 2020-05-01 NOTE — Addendum Note (Signed)
Addended by: Barrington Ellison A on: 05/01/2020 12:27 PM   Modules accepted: Orders

## 2020-05-01 NOTE — Telephone Encounter (Signed)
Attempted phone call to pt.  Per Epic ok to leave a detailed message on voicemail.  Pt advised per Dr Tamala Julian, critical K+ level of 2.9 received today.  Pt will need to take 2 additional Potassium Chloride 18meq today.   Requested pt contact [place] at 8am 05/02/2020 at [phone] to let us know if he has been compliant with his Furosemide, Spironolactone and Klor-Con as knowing this information will be imperative for further treatment of pt's low potassium.

## 2020-05-01 NOTE — Telephone Encounter (Signed)
Reviewed labs and changes    Patient should come in for repeat BMET in 1 week

## 2020-05-01 NOTE — Progress Notes (Addendum)
Electrophysiology Office Note Date: 05/01/2020  ID:  Stanley [name], DOB 07-21-1965, MRN [id number]  PCP: Gildardo Pounds, NP Primary Cardiologist: Dr. Aundra Dubin Electrophysiologist: Thompson Grayer, MD   CC: SOB  Stanley [name] is a 55 y.o. male seen today for acute visit due to SOB and CP per GI note. Since last visit, he reports "very well when he takes his medicine"  He reports intermittent chest pain, similar to his chronic, atypical chest pain. No clear relation to exertion. No clear aggravating or relieving factors. Feels like a twinge of pain or "soreness" that lasts up to 8 seconds, usually not longer.   He denies any SOB with ADLs or doing the things he wants currently. He had presented for HF symptoms as above, but also reports intermittent stim at his Barostim site. Its described as a "vibrating" sensation with he yawns or looks to the left. NOT reproducible in office today. Otherwise he feels like he is doing very well. Pending colonoscopy for screening purposes.   Device History: Barostim (standard) implanted 07/07/2019 for Chronic systolic CHF Boston Scientific single chamber ICD 06/05/2013 for chronic systolic CHF   Past Medical History:  Diagnosis Date  . Acute renal failure (ARF) ([place])   . Acute respiratory failure with hypoxia ([place]) 12/29/2018  . Alcohol abuse   . Anxiety   . CAD (coronary artery disease)    a. dx unclear, reported PCI in 2011 but cath 2014 normal coronaries and cardiac CT 07/2016 normal coronaries, no mention of stents.  . Chronic chest pain   . Chronic systolic CHF (congestive heart failure) ([place])   . Depression   . Dyspnea    07/05/2019: per patient some times with exertion, "has to catch breath"  . Financial difficulties   . Gout   . Gouty arthritis   . Headache   . History of noncompliance with medical treatment    financial challenges  . Hypertension   . ICD (implantable cardioverter-defibrillator) in place    [place] SciICD implant  done in [place] 2015  . Insomnia   . Lobar pneumonia ([place]) 12/28/2018  . Myocardial infarction ([place]) 2005  . Nonischemic cardiomyopathy ([place])   . Sleep apnea    per patient, has CPAP does not wear it every night  . Ventricular tachycardia ([place]) 01/2015   2017 - ATP delivered for sinus tach, degenerated into VT for which he received shock. Recurrent VT 03/2018.   Past Surgical History:  Procedure Laterality Date  . CARDIAC DEFIBRILLATOR PLACEMENT  06/05/13   Boston Scientific Inogen ICD implanted at [facility] in [place] for primary prevention  . HERNIA REPAIR    . PERCUTANEOUS CORONARY STENT INTERVENTION (PCI-S)  2011    Current Outpatient Medications  Medication Sig Dispense Refill  . amiodarone (PACERONE) 200 MG tablet TAKE 1 TABLET(200 MG) BY MOUTH DAILY 90 tablet 3  . atorvastatin (LIPITOR) 20 MG tablet Take 1 tablet (20 mg total) by mouth daily. 90 tablet 3  . carvedilol (COREG) 25 MG tablet TAKE 1 TABLET(25 MG) BY MOUTH TWICE DAILY WITH A MEAL 180 tablet 3  . digoxin (LANOXIN) 0.125 MG tablet Take 1 tablet (0.125 mg total) by mouth daily. 90 tablet 3  . furosemide (LASIX) 80 MG tablet TAKE 1 TABLET BY MOUTH ON MONDAY, WEDNESDAY, AND FRIDAY. MAY ALSO TAKE 1 TABLET AS NEEDED FOR SWELLING 60 tablet 2  . hydroquinone 4 % cream Apply topically 2 (two) times daily. 50 g 1  . isosorbide-hydrALAZINE (BIDIL) 20-37.5  MG tablet Take 1 tablet by mouth 3 (three) times daily. 90 tablet 3  . losartan (COZAAR) 100 MG tablet Take 1 tablet (100 mg total) by mouth daily. 30 tablet 6  . nitroGLYCERIN (NITROSTAT) 0.4 MG SL tablet Place 1 tablet (0.4 mg total) under the tongue every 5 (five) minutes x 3 doses as needed for chest pain. 30 tablet 6  . pantoprazole (PROTONIX) 40 MG tablet Take 1 tablet (40 mg total) by mouth daily. 30 tablet 5  . potassium chloride SA (KLOR-CON) 20 MEQ tablet Take 2 tablets (40 mEq total) by mouth daily. 180 tablet 3  . sodium chloride (OCEAN) 0.65 % SOLN nasal spray  Place 1 spray into both nostrils daily as needed for congestion.    Marland Kitchen spironolactone (ALDACTONE) 25 MG tablet Take 1 tablet (25 mg total) by mouth daily. 90 tablet 3  . traZODone (DESYREL) 150 MG tablet Take 1 tablet (150 mg total) by mouth at bedtime. 90 tablet 0   No current facility-administered medications for this visit.    Allergies:   Lisinopril, Apple, Entresto [sacubitril-valsartan], Other, and Shellfish allergy   Social History: Social History   Socioeconomic History  . Marital status: Divorced    Spouse name: Not on file  . Number of children: 1  . Years of education: 28  . Highest education level: Not on file  Occupational History  . Occupation: Disability  Tobacco Use  . Smoking status: Never Smoker  . Smokeless tobacco: Never Used  Vaping Use  . Vaping Use: Never used  Substance and Sexual Activity  . Alcohol use: Yes    Alcohol/week: 0.0 standard drinks    Comment: rarely  . Drug use: No  . Sexual activity: Not Currently  Other Topics Concern  . Not on file  Social History Narrative   Lives in [place] alone.  Disabled.  Previously worked as a Careers adviser.   Fun: Rest, Read and watch movies.    Social Determinants of Health   Financial Resource Strain: Not on file  Food Insecurity: Not on file  Transportation Needs: Not on file  Physical Activity: Not on file  Stress: Not on file  Social Connections: Not on file  Intimate Partner Violence: Not on file    Family History: Family History  Problem Relation Age of Onset  . Cancer Mother        Pancreatic  . Hypertension Father   . Alzheimer's disease Father   . Gout Father   . Dementia Father   . Hypertension Sister   . Clotting disorder Sister   . Obesity Brother   . Bronchitis Brother   . Heart disease Maternal Grandmother   . Gout Paternal Grandfather   . Colon polyps Neg Hx   . Colon cancer Neg Hx      Review of Systems: All other systems reviewed and are otherwise negative  except as noted above.  Physical Exam: Vitals:   05/01/20 1013  BP: 118/78  Pulse: 67  SpO2: 97%  Weight: 180 lb (81.6 kg)  Height: 5\' 5"  (1.651 m)     General: Well appearing. No resp difficulty. HEENT: Normal Neck: Supple. JVP 5-6. Carotids 2+ bilat; no bruits. No thyromegaly or nodule noted. Cor: PMI nondisplaced. RRR, No M/G/R noted Lungs: CTAB, normal effort. Abdomen: Soft, non-tender, non-distended, no HSM. No bruits or masses. +BS  Extremities: No cyanosis, clubbing, or rash. R and LLE no edema.  Neuro: Alert & orientedx3, cranial nerves grossly intact. moves all 4  extremities w/o difficulty. Affect pleasant   ICD interrogation: Reviewed in detail today, see Paceart report.   EKG:  EKG is ordered today. Personal review shows NSR at 67 bpm, IVCD with QRS of 138 ms  Recent Labs: 12/06/2019: TSH 0.542 03/07/2020: ALT 19; BUN 19; Creatinine 1.54; Hemoglobin 12.7; Platelets 250; Potassium 3.1; Sodium 141   Wt Readings from Last 3 Encounters:  05/01/20 180 lb (81.6 kg)  04/23/20 184 lb 12.8 oz (83.8 kg)  04/09/20 186 lb 4.8 oz (84.5 kg)     Other studies Reviewed: Additional studies/ records that were reviewed today include: Previous EP office notes.   Assessment and Plan:  1. Chronic systolic CHF s/p Barostim (standard) and Boston Sci single chamber ICD NYHA II symptoms Barostim is programmed at 6.0 ma for chronic settings. Pt reports intermittent stim NOT reproducible in office. Decreased to 5.4 ma today. Battery life estimated to last until 05/25/2024 at new settings. Normal ICD function today.  See PaceArt report.   2. Poorly controlled HTN, HTN urgency Very well controlled today. Continued to encouraged med compliance  3. Hypokalemia K 3.3 03/07/20. Labs today.   4. Atypical chest pain Chronic, unchanged.  Myoview 05/2016 with artifact as CTA 07/15/2016 with Ca score of 0 and no evidence of CAD In setting of upcoming colonoscopy/sedation, will update Myoview.  Overall low pre-test probability.   5. H/o VT No recent recurrences.   Amiodarone surveillance labs at today.    I suspect patient will be fine to proceed with Colonoscopy. His myoview is scheduled for 5/20 and his colonoscopy is scheduled for 5/23. I'll reach out to [facility] as they may want to move back a week to make sure there is enough time in between.   Current medicines are reviewed at length with the patient today.   The patient does not have concerns regarding his medicines.  The following changes were made today:  None.  Labs/ tests ordered today include:  Orders Placed This Encounter  Procedures  . Comprehensive metabolic panel  . CBC  . TSH  . MYOCARDIAL PERFUSION IMAGING  . EKG 12-Lead   Disposition:  RTC for EP visit/barostim check in 6 months.   Jacalyn Lefevre, PA-C  05/01/2020 10:42 AM  [facility] [address] [place] [address] [phone] (office) [phone] (fax)

## 2020-05-01 NOTE — Patient Instructions (Signed)
Medication Instructions:  Your physician recommends that you continue on your current medications as directed. Please refer to the Current Medication list given to you today.  *If you need a refill on your cardiac medications before your next appointment, please call your pharmacy*   Lab Work: TODAY: CMET, CBC, TSH  If you have labs (blood work) drawn today and your tests are completely normal, you will receive your results only by: Marland Kitchen MyChart Message (if you have MyChart) OR . A paper copy in the mail If you have any lab test that is abnormal or we need to change your treatment, we will call you to review the results.   Testing/Procedures: Your physician has requested that you have a lexiscan myoview. For further information please visit [email or web address]. Please follow instruction sheet, as given.  Follow-Up: At [facility], you and your health needs are our priority.  As part of our continuing mission to provide you with exceptional heart care, we have created designated Provider Care Teams.  These Care Teams include your primary Cardiologist (physician) and Advanced Practice Providers (APPs -  Physician Assistants and Nurse Practitioners) who all work together to provide you with the care you need, when you need it.   Your next appointment:   6 month(s)  The format for your next appointment:   In Person  Provider:   Legrand Como "Oda Kilts, PA-C

## 2020-05-02 NOTE — Telephone Encounter (Signed)
Informed patient of results and verbal understanding expressed. Pt will take extra K+ today as instructed. Pt will stop by office next Wednesday, 5/4, for follow up BMET.

## 2020-05-02 NOTE — Addendum Note (Signed)
Addended by: Stanton Kidney on: 05/02/2020 08:49 AM   Modules accepted: Orders

## 2020-05-02 NOTE — Telephone Encounter (Signed)
After speaking w/ pt and giving Dr. Alan Ripper recommendation, Stanley [name] reached out to make sure we had seen his note as well (see below). Called pt back to discuss further.  Pt then tells me he hasn't been taking his Potassium daily (yet he said he was in our earlier conversation). Pt instructed to start taking it daily going forward and reviewed the importance of compliance w/ this. Pt is taking his Lasix 3 x weekly. Pt will stop by office next week for follow up blood work. [name] made aware

## 2020-05-02 NOTE — Telephone Encounter (Signed)
Please confirm how he is taking potassium.  Pt needs to take total of 120 meq tomorrow, 4/28. Best accomplished by taking 60 meq (3 tabs) BID with at least 4 hours in between doses or 40 meq (2 tabs) 3 times with at least 3-4 hours in between doses . Any more at one time will have poor absorption.  Pt should take a total of 6 tabs (120 meq 4/28, Thursday.   If he is taking 40 meq daily as prescribed, please double to 40 meq BID chronically starting 4/29.  If he is taking any less than 40 meq daily, please double for chronic dosing starting 4/29, splitting dose to BID if >24meq.   If he is not taking at all, please stress importance of taking as prescribed and would just restart at 40 meq daily after 120 meq boost as above.   Needs repeat BMET MONDAY. Please repeat TSH and add Free T4 at recheck as well with low TSH.   Legrand Como [address] [place], [place]

## 2020-05-02 NOTE — Telephone Encounter (Signed)
Left message to call back  

## 2020-05-08 ENCOUNTER — Other Ambulatory Visit: Payer: Self-pay

## 2020-05-08 ENCOUNTER — Inpatient Hospital Stay: Payer: Medicaid Other

## 2020-05-08 ENCOUNTER — Inpatient Hospital Stay: Payer: Medicaid Other | Admitting: Genetic Counselor

## 2020-05-08 ENCOUNTER — Other Ambulatory Visit: Payer: Medicaid Other | Admitting: *Deleted

## 2020-05-08 DIAGNOSIS — E876 Hypokalemia: Secondary | ICD-10-CM

## 2020-05-08 DIAGNOSIS — E079 Disorder of thyroid, unspecified: Secondary | ICD-10-CM

## 2020-05-08 DIAGNOSIS — R7989 Other specified abnormal findings of blood chemistry: Secondary | ICD-10-CM

## 2020-05-09 LAB — BASIC METABOLIC PANEL
BUN/Creatinine Ratio: 13 (ref 9–20)
BUN: 16 mg/dL (ref 6–24)
CO2: 21 mmol/L (ref 20–29)
Calcium: 9.7 mg/dL (ref 8.7–10.2)
Chloride: 100 mmol/L (ref 96–106)
Creatinine, Ser: 1.22 mg/dL (ref 0.76–1.27)
Glucose: 117 mg/dL — ABNORMAL HIGH (ref 65–99)
Potassium: 3.5 mmol/L (ref 3.5–5.2)
Sodium: 140 mmol/L (ref 134–144)
eGFR: 70 mL/min/{1.73_m2} (ref >59)

## 2020-05-09 LAB — TSH: TSH: 0.273 u[IU]/mL — ABNORMAL LOW (ref 0.450–4.500)

## 2020-05-09 LAB — T4, FREE: Free T4: 1.51 ng/dL (ref 0.82–1.77)

## 2020-05-10 ENCOUNTER — Telehealth: Payer: Self-pay

## 2020-05-10 NOTE — Telephone Encounter (Signed)
Zehr, Laban Emperor, PA-C  Elias Else, CMA Do not forget that this patient needs to be rescheduled since his stress test is not until May 20 his procedure will need to be postponed later than his already scheduled May 23 appointment. Just wanted to remind you.   Thank you,   Jess    Left message to return call. Needs to move procedure from the 5-23 date. Possibly move to 06-13-2020 or 6-28 with Dr Havery Moros

## 2020-05-12 LAB — CUP PACEART INCLINIC DEVICE CHECK
Date Time Interrogation Session: 20220427103035
Implantable Lead Implant Date: 20150601
Implantable Lead Location: 753860
Implantable Lead Model: 295
Implantable Lead Serial Number: 134196
Implantable Pulse Generator Implant Date: 20150601
Lead Channel Pacing Threshold Amplitude: 1.4 V
Lead Channel Pacing Threshold Pulse Width: 0.6 ms
Lead Channel Sensing Intrinsic Amplitude: 9.7 mV
Pulse Gen Serial Number: 190689

## 2020-05-15 ENCOUNTER — Other Ambulatory Visit: Payer: Self-pay | Admitting: Internal Medicine

## 2020-05-17 NOTE — Telephone Encounter (Signed)
Patient has not returned call.  Procedure has been cancelled.  Left another voicemail ti inform patient

## 2020-05-17 NOTE — Telephone Encounter (Signed)
Patient returned the call I advised him of procedure cancellation due to upcoming stress test that is needed prior to colonoscopy. Patient had questions on the scheduled stress test appointment asked to please call him back.

## 2020-05-20 ENCOUNTER — Other Ambulatory Visit: Payer: Self-pay

## 2020-05-20 DIAGNOSIS — I428 Other cardiomyopathies: Secondary | ICD-10-CM

## 2020-05-20 DIAGNOSIS — I519 Heart disease, unspecified: Secondary | ICD-10-CM

## 2020-05-20 NOTE — Telephone Encounter (Signed)
Patient aware we will call him back with a new date

## 2020-05-22 ENCOUNTER — Telehealth (HOSPITAL_COMMUNITY): Payer: Self-pay | Admitting: *Deleted

## 2020-05-22 NOTE — Telephone Encounter (Signed)
Patient given detailed instructions per Myocardial Perfusion Study Information Sheet for the test on 05/24/20 at 10:45. Patient notified to arrive 15 minutes early and that it is imperative to arrive on time for appointment to keep from having the test rescheduled.  If you need to cancel or reschedule your appointment, please call the office within 24 hours of your appointment. . Patient verbalized understanding.Stanley [name]

## 2020-05-23 ENCOUNTER — Other Ambulatory Visit: Payer: Self-pay

## 2020-05-23 DIAGNOSIS — K219 Gastro-esophageal reflux disease without esophagitis: Secondary | ICD-10-CM

## 2020-05-23 DIAGNOSIS — Z1211 Encounter for screening for malignant neoplasm of colon: Secondary | ICD-10-CM

## 2020-05-23 NOTE — Telephone Encounter (Signed)
I have spoke to Stanley [name], He has been scheduled for 07-29-2020 with Dr Havery Moros at [facility]. New instructions have been emailed to him as requested and I have also mailed the paper copy

## 2020-05-24 ENCOUNTER — Ambulatory Visit (HOSPITAL_COMMUNITY): Payer: Medicaid Other | Attending: Internal Medicine

## 2020-05-24 ENCOUNTER — Other Ambulatory Visit: Payer: Medicaid Other | Admitting: *Deleted

## 2020-05-24 ENCOUNTER — Other Ambulatory Visit: Payer: Self-pay

## 2020-05-24 DIAGNOSIS — R079 Chest pain, unspecified: Secondary | ICD-10-CM | POA: Insufficient documentation

## 2020-05-24 DIAGNOSIS — I519 Heart disease, unspecified: Secondary | ICD-10-CM

## 2020-05-24 DIAGNOSIS — I428 Other cardiomyopathies: Secondary | ICD-10-CM

## 2020-05-24 LAB — BASIC METABOLIC PANEL
BUN/Creatinine Ratio: 11 (ref 9–20)
BUN: 15 mg/dL (ref 6–24)
CO2: 19 mmol/L — ABNORMAL LOW (ref 20–29)
Calcium: 9.4 mg/dL (ref 8.7–10.2)
Chloride: 100 mmol/L (ref 96–106)
Creatinine, Ser: 1.4 mg/dL — ABNORMAL HIGH (ref 0.76–1.27)
Glucose: 114 mg/dL — ABNORMAL HIGH (ref 65–99)
Potassium: 3 mmol/L — ABNORMAL LOW (ref 3.5–5.2)
Sodium: 137 mmol/L (ref 134–144)
eGFR: 59 mL/min/{1.73_m2} — ABNORMAL LOW (ref >59)

## 2020-05-24 LAB — MYOCARDIAL PERFUSION IMAGING
LV dias vol: 170 mL (ref 62–150)
LV sys vol: 113 mL
SDS: 7
SRS: 0
SSS: 7
TID: 0.94

## 2020-05-24 LAB — PRO B NATRIURETIC PEPTIDE: NT-Pro BNP: 180 pg/mL (ref 0–210)

## 2020-05-24 MED ORDER — REGADENOSON 0.4 MG/5ML IV SOLN
0.4000 mg | Freq: Once | INTRAVENOUS | Status: AC
  Administered 2020-05-24: 0.4 mg via INTRAVENOUS

## 2020-05-24 MED ORDER — TECHNETIUM TC 99M TETROFOSMIN IV KIT
10.2000 | PACK | Freq: Once | INTRAVENOUS | Status: AC | PRN
Start: 2020-05-24 — End: 2020-05-24
  Administered 2020-05-24: 10.2 via INTRAVENOUS
  Filled 2020-05-24: qty 11

## 2020-05-24 MED ORDER — TECHNETIUM TC 99M TETROFOSMIN IV KIT
32.2000 | PACK | Freq: Once | INTRAVENOUS | Status: AC | PRN
Start: 2020-05-24 — End: 2020-05-24
  Administered 2020-05-24: 32.2 via INTRAVENOUS
  Filled 2020-05-24: qty 33

## 2020-05-27 ENCOUNTER — Encounter (HOSPITAL_COMMUNITY): Admission: RE | Payer: Self-pay | Source: Home / Self Care

## 2020-05-27 ENCOUNTER — Ambulatory Visit (HOSPITAL_COMMUNITY): Admission: RE | Admit: 2020-05-27 | Payer: Medicaid Other | Source: Home / Self Care | Admitting: Gastroenterology

## 2020-05-27 ENCOUNTER — Other Ambulatory Visit: Payer: Self-pay

## 2020-05-27 DIAGNOSIS — E876 Hypokalemia: Secondary | ICD-10-CM

## 2020-05-27 SURGERY — ESOPHAGOGASTRODUODENOSCOPY (EGD) WITH PROPOFOL
Anesthesia: Monitor Anesthesia Care

## 2020-05-30 ENCOUNTER — Telehealth (HOSPITAL_COMMUNITY): Payer: Self-pay | Admitting: Vascular Surgery

## 2020-05-30 ENCOUNTER — Telehealth: Payer: Self-pay | Admitting: Internal Medicine

## 2020-05-30 NOTE — Telephone Encounter (Signed)
Left pt message to make f/u appt w/ db/ app before 7/26

## 2020-05-30 NOTE — Telephone Encounter (Signed)
RETURNING PHONE CALL

## 2020-06-01 IMAGING — CT CT ANGIO NECK
2 of 7 series · 8 of 33 positions shown · IV contrast (omnipaque)
Comparison: None.

CLINICAL DATA: Screening for carotid stenosis due to risk factors.

EXAM:
CT ANGIOGRAPHY NECK
TECHNIQUE: Multidetector CT imaging of the neck was performed using the
standard protocol during bolus administration of intravenous
contrast. Multiplanar CT image reconstructions and MIPs were
obtained to evaluate the vascular anatomy. Carotid stenosis
measurements (when applicable) are obtained utilizing NASCET
criteria, using the distal internal carotid diameter as the
denominator.
CONTRAST:  75mL OMNIPAQUE IOHEXOL 350 MG/ML SOLN

[Series 5: cta neck · axial · 0.51mm/px · z∈[+946,+1010]mm · 2 of 98 slices shown]
[im 33/98  soft-tissue]
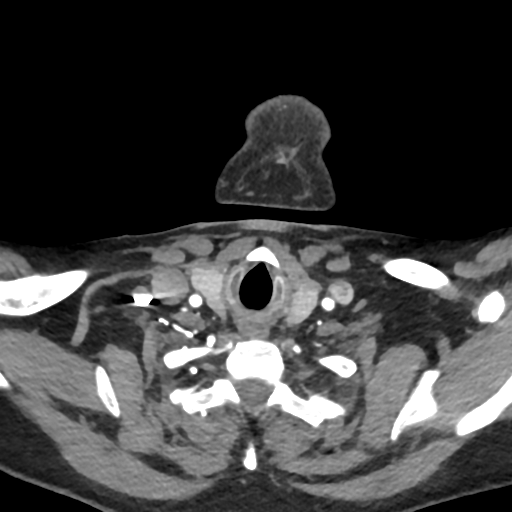
[im 65/98  soft-tissue]
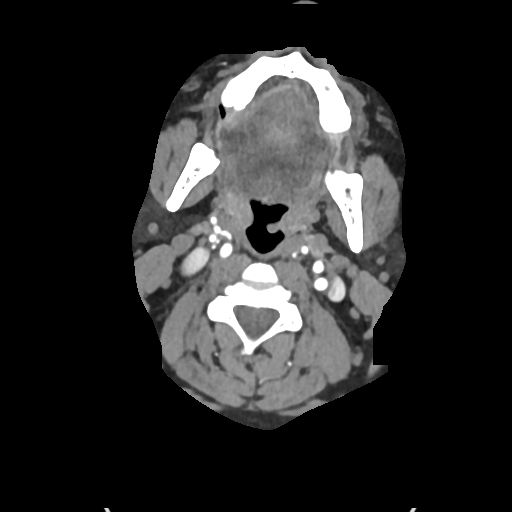

[Series 7: ax thins · axial · 0.39mm/px · z∈[+864,+1000]mm · 6 of 216 slices shown]
[im 31/216  soft-tissue]
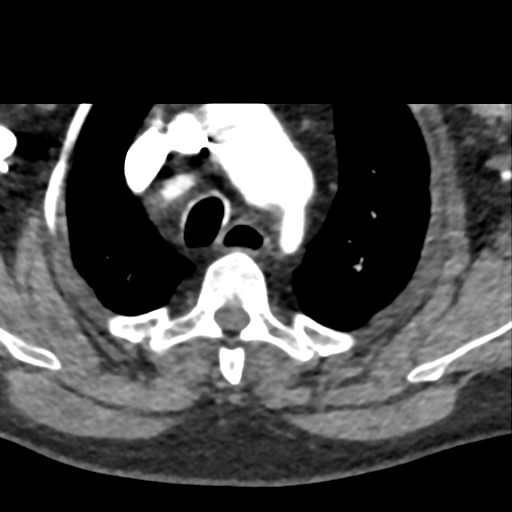
[im 62/216  bone]
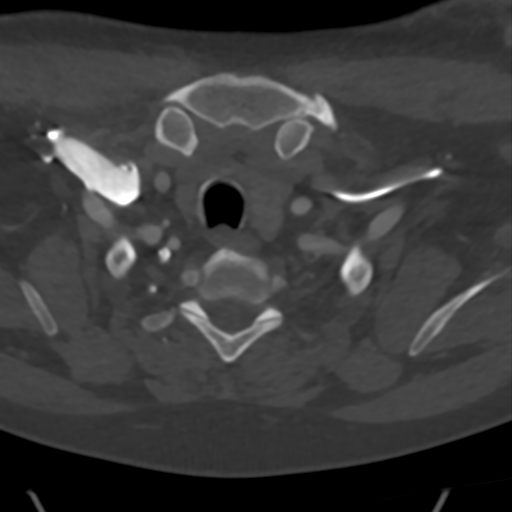
[im 93/216  soft-tissue]
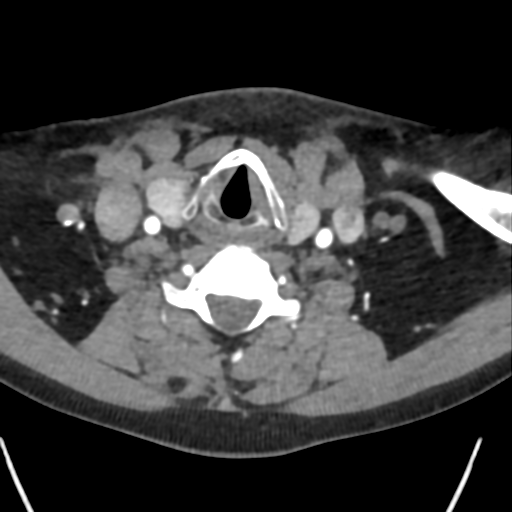
[im 123/216  bone]
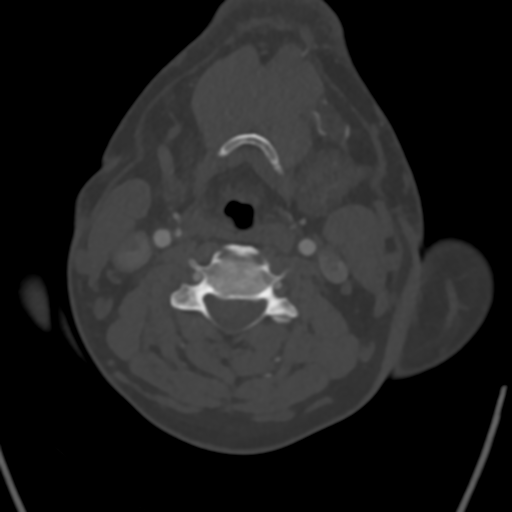
[im 154/216  soft-tissue]
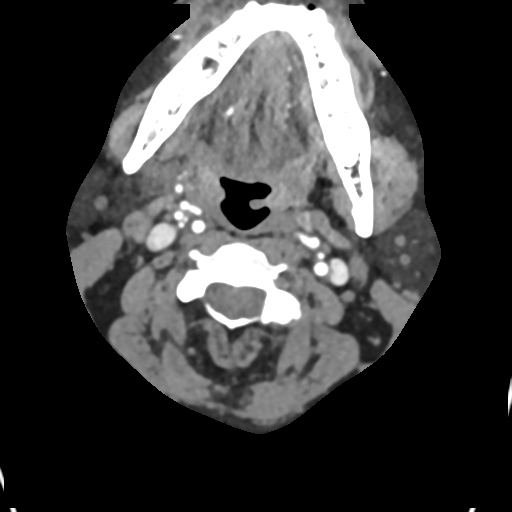
[im 185/216  bone]
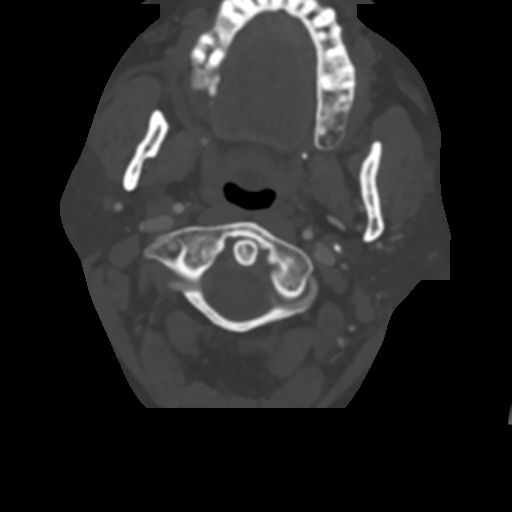

[8 of 33 positions shown; findings below may reference images not displayed]

FINDINGS: Aortic arch: Unremarkable where covered.

Right carotid system: Tortuous brachiocephalic artery without
stenosis. Carotid arteries are smooth and widely patent with no
atheromatous changes.

Left carotid system: Smooth and widely patent with no atheromatous
changes.

Vertebral arteries: No proximal subclavian stenosis or irregularity.
The right vertebral artery is dominant. Both vertebral arteries are
smooth and widely patent to the dura.

Skeleton: Disc narrowing and ventral spurring in the cervical spine.
Right C2-3 facet osteoarthritis.

Other neck: Negative

Upper chest: Negative
IMPRESSION: Negative neck CTA.  No stenosis, beading, or atheromatous changes.

## 2020-06-03 ENCOUNTER — Encounter: Payer: Self-pay | Admitting: Nurse Practitioner

## 2020-06-04 ENCOUNTER — Telehealth: Payer: Self-pay | Admitting: *Deleted

## 2020-06-04 ENCOUNTER — Other Ambulatory Visit: Payer: Medicaid Other | Admitting: *Deleted

## 2020-06-04 ENCOUNTER — Other Ambulatory Visit: Payer: Self-pay

## 2020-06-04 ENCOUNTER — Other Ambulatory Visit: Payer: Self-pay | Admitting: Nurse Practitioner

## 2020-06-04 DIAGNOSIS — E876 Hypokalemia: Secondary | ICD-10-CM

## 2020-06-04 DIAGNOSIS — L811 Chloasma: Secondary | ICD-10-CM

## 2020-06-04 DIAGNOSIS — F418 Other specified anxiety disorders: Secondary | ICD-10-CM

## 2020-06-04 LAB — BASIC METABOLIC PANEL
BUN/Creatinine Ratio: 13 (ref 9–20)
BUN: 18 mg/dL (ref 6–24)
CO2: 17 mmol/L — ABNORMAL LOW (ref 20–29)
Calcium: 10.1 mg/dL (ref 8.7–10.2)
Chloride: 100 mmol/L (ref 96–106)
Creatinine, Ser: 1.37 mg/dL — ABNORMAL HIGH (ref 0.76–1.27)
Glucose: 134 mg/dL — ABNORMAL HIGH (ref 65–99)
Potassium: 2.8 mmol/L — CL (ref 3.5–5.2)
Sodium: 138 mmol/L (ref 134–144)
eGFR: 61 mL/min/{1.73_m2} (ref >59)

## 2020-06-04 MED ORDER — TRAZODONE HCL 150 MG PO TABS: 150.0000 mg | ORAL_TABLET | Freq: Every day | ORAL | 0 refills | Status: DC

## 2020-06-04 MED ORDER — HYDROQUINONE 4 % EX CREA: TOPICAL_CREAM | Freq: Two times a day (BID) | CUTANEOUS | 1 refills | Status: DC

## 2020-06-04 NOTE — Telephone Encounter (Signed)
Per Oda Kilts to notify patient of critical lab result of low potassium 2.8.  Patient is taking 20 meq of K every other day. (prescribed 40 meq daily). Advised patient to take 80 mg total today, then take 40 daily. Recheck lab on Friday morning. Patient in agreement and verbalized understanding.

## 2020-06-07 ENCOUNTER — Other Ambulatory Visit: Payer: Self-pay

## 2020-06-07 ENCOUNTER — Other Ambulatory Visit: Payer: Medicaid Other | Admitting: *Deleted

## 2020-06-07 DIAGNOSIS — E876 Hypokalemia: Secondary | ICD-10-CM

## 2020-06-07 LAB — BASIC METABOLIC PANEL
BUN/Creatinine Ratio: 11 (ref 9–20)
BUN: 16 mg/dL (ref 6–24)
CO2: 16 mmol/L — ABNORMAL LOW (ref 20–29)
Calcium: 9.3 mg/dL (ref 8.7–10.2)
Chloride: 103 mmol/L (ref 96–106)
Creatinine, Ser: 1.43 mg/dL — ABNORMAL HIGH (ref 0.76–1.27)
Glucose: 99 mg/dL (ref 65–99)
Potassium: 3.6 mmol/L (ref 3.5–5.2)
Sodium: 139 mmol/L (ref 134–144)
eGFR: 58 mL/min/{1.73_m2} — ABNORMAL LOW (ref >59)

## 2020-06-11 ENCOUNTER — Telehealth (HOSPITAL_COMMUNITY): Payer: Self-pay | Admitting: Vascular Surgery

## 2020-06-11 NOTE — Telephone Encounter (Signed)
3 rd attempt to reach pt for f/u  appt

## 2020-06-14 ENCOUNTER — Telehealth: Payer: Self-pay | Admitting: Student

## 2020-06-14 NOTE — Telephone Encounter (Signed)
    Pt is returning call for his lab result 

## 2020-06-14 NOTE — Telephone Encounter (Signed)
Shirley Friar, PA-C  06/11/2020  8:44 AM EDT      Please let him know his potassium is near normal now and re-iterate we want himto CONTINUE K 40 meq daily     Thank you!    The patient has been notified of the result and verbalized understanding.  All questions (if any) were answered. Antonieta Iba, RN 06/14/2020 1:31 PM

## 2020-06-18 ENCOUNTER — Telehealth (HOSPITAL_COMMUNITY): Payer: Self-pay | Admitting: Vascular Surgery

## 2020-06-18 NOTE — Telephone Encounter (Signed)
Left pt several messages to make f/u appt for surgical clearance

## 2020-06-26 ENCOUNTER — Ambulatory Visit: Payer: Medicaid Other

## 2020-07-10 NOTE — Telephone Encounter (Signed)
Inbound call from patient stating due to health issues, wants to cancel their procedure scheduled for 07/30/20.  Will call at a later date to reschedule.

## 2020-07-18 ENCOUNTER — Other Ambulatory Visit: Payer: Self-pay | Admitting: Family Medicine

## 2020-07-18 ENCOUNTER — Other Ambulatory Visit: Payer: Self-pay | Admitting: Critical Care Medicine

## 2020-07-18 ENCOUNTER — Encounter (HOSPITAL_COMMUNITY): Payer: Self-pay | Admitting: *Deleted

## 2020-07-18 NOTE — Telephone Encounter (Signed)
  Notes to clinic:  request script has expired  Review for continued use and refill    Requested Prescriptions  Pending Prescriptions Disp Refills   isosorbide-hydrALAZINE (BIDIL) 20-37.5 MG tablet [Pharmacy Med Name: ISOSO/HYDRAL 20MG -37.5MG  TABLETS] 90 tablet 3    Sig: TAKE 1 TABLET BY MOUTH THREE TIMES DAILY      Cardiovascular:  Vasodilators Failed - 07/18/2020 12:40 PM      Failed - RBC in normal range and within 360 days    RBC  Date Value Ref Range Status  05/01/2020 3.95 (L) 4.14 - 5.80 x10E6/uL Final  03/07/2020 3.92 (L) 4.22 - 5.81 MIL/uL Final          Failed - WBC in normal range and within 360 days    WBC  Date Value Ref Range Status  05/01/2020 15.5 (H) 3.4 - 10.8 x10E3/uL Final  03/07/2020 13.6 (H) 4.0 - 10.5 K/uL Final          Passed - HCT in normal range and within 360 days    Hematocrit  Date Value Ref Range Status  05/01/2020 37.6 37.5 - 51.0 % Final          Passed - HGB in normal range and within 360 days    Hemoglobin  Date Value Ref Range Status  05/01/2020 13.4 13.0 - 17.7 g/dL Final          Passed - PLT in normal range and within 360 days    Platelets  Date Value Ref Range Status  05/01/2020 287 150 - 450 x10E3/uL Final          Passed - Last BP in normal range    BP Readings from Last 1 Encounters:  05/01/20 118/78          Passed - Valid encounter within last 12 months    Recent Outpatient Visits           3 months ago Insomnia secondary to depression with anxiety   [place], Vernia Buff, NP   5 months ago Essential hypertension   [place], Vernia Buff, NP   1 year ago Nonischemic cardiomyopathy [facility])   [place] Elsie Stain, MD   1 year ago Pneumonia due to infectious organism, unspecified laterality, unspecified part of lung   [place], Patrick E, MD   2 years ago  Essential hypertension   [place], [place], [place]

## 2020-07-18 NOTE — Telephone Encounter (Signed)
Notes to clinic:  Patient requests 90 days supply   Requested Prescriptions  Pending Prescriptions Disp Refills   isosorbide dinitrate (ISORDIL) 20 MG tablet [Pharmacy Med Name: ISOSORBIDE DINITRATE 20MG  TABLETS] 270 tablet     Sig: TAKE 1 TABLET BY MOUTH THREE TIMES DAILY      Cardiovascular:  Nitrates Passed - 07/18/2020  2:25 PM      Passed - Last BP in normal range    BP Readings from Last 1 Encounters:  05/01/20 118/78          Passed - Last Heart Rate in normal range    Pulse Readings from Last 1 Encounters:  05/01/20 67          Passed - Valid encounter within last 12 months    Recent Outpatient Visits           3 months ago Insomnia secondary to depression with anxiety   [place], Vernia Buff, NP   5 months ago Essential hypertension   [place], Vernia Buff, NP   1 year ago Nonischemic cardiomyopathy [facility])   [place] Elsie Stain, MD   1 year ago Pneumonia due to infectious organism, unspecified laterality, unspecified part of lung   [place], Patrick E, MD   2 years ago Essential hypertension   [place], [place]                  hydrALAZINE (APRESOLINE) 25 MG tablet [Pharmacy Med Name: HYDRALAZINE  25MG  TABLETS(ORANGE)] 405 tablet     Sig: TAKE 1 AND 1/2 TABLETS BY MOUTH THREE TIMES DAILY      Cardiovascular:  Vasodilators Failed - 07/18/2020  2:25 PM      Failed - RBC in normal range and within 360 days    RBC  Date Value Ref Range Status  05/01/2020 3.95 (L) 4.14 - 5.80 x10E6/uL Final  03/07/2020 3.92 (L) 4.22 - 5.81 MIL/uL Final          Failed - WBC in normal range and within 360 days    WBC  Date Value Ref Range Status  05/01/2020 15.5 (H) 3.4 - 10.8 x10E3/uL Final  03/07/2020 13.6 (H) 4.0 - 10.5 K/uL Final          Passed - HCT in normal  range and within 360 days    Hematocrit  Date Value Ref Range Status  05/01/2020 37.6 37.5 - 51.0 % Final          Passed - HGB in normal range and within 360 days    Hemoglobin  Date Value Ref Range Status  05/01/2020 13.4 13.0 - 17.7 g/dL Final          Passed - PLT in normal range and within 360 days    Platelets  Date Value Ref Range Status  05/01/2020 287 150 - 450 x10E3/uL Final          Passed - Last BP in normal range    BP Readings from Last 1 Encounters:  05/01/20 118/78          Passed - Valid encounter within last 12 months    Recent Outpatient Visits           3 months ago Insomnia secondary to depression with anxiety   [place], Vernia Buff, NP  5 months ago Essential hypertension   [place], NP   1 year ago Nonischemic cardiomyopathy [facility])   [place] Elsie Stain, MD   1 year ago Pneumonia due to infectious organism, unspecified laterality, unspecified part of lung   [place], Patrick E, MD   2 years ago Essential hypertension   [place], [place]

## 2020-07-19 ENCOUNTER — Other Ambulatory Visit: Payer: Self-pay | Admitting: Critical Care Medicine

## 2020-07-19 NOTE — Telephone Encounter (Signed)
  Notes to clinic:  The original prescription was discontinued on 07/18/2020 by Daisy Blossom, Jarome Matin, RPH-CPP. Renewing this prescription may not be appropriate   Requested Prescriptions  Pending Prescriptions Disp Refills   isosorbide-hydrALAZINE (BIDIL) 20-37.5 MG tablet [Pharmacy Med Name: ISOSO/HYDRAL 20MG -37.5MG  TABLETS] 90 tablet 3    Sig: TAKE 1 TABLET BY MOUTH THREE TIMES DAILY      Cardiovascular:  Vasodilators Failed - 07/19/2020  1:31 PM      Failed - RBC in normal range and within 360 days    RBC  Date Value Ref Range Status  05/01/2020 3.95 (L) 4.14 - 5.80 x10E6/uL Final  03/07/2020 3.92 (L) 4.22 - 5.81 MIL/uL Final          Failed - WBC in normal range and within 360 days    WBC  Date Value Ref Range Status  05/01/2020 15.5 (H) 3.4 - 10.8 x10E3/uL Final  03/07/2020 13.6 (H) 4.0 - 10.5 K/uL Final          Passed - HCT in normal range and within 360 days    Hematocrit  Date Value Ref Range Status  05/01/2020 37.6 37.5 - 51.0 % Final          Passed - HGB in normal range and within 360 days    Hemoglobin  Date Value Ref Range Status  05/01/2020 13.4 13.0 - 17.7 g/dL Final          Passed - PLT in normal range and within 360 days    Platelets  Date Value Ref Range Status  05/01/2020 287 150 - 450 x10E3/uL Final          Passed - Last BP in normal range    BP Readings from Last 1 Encounters:  05/01/20 118/78          Passed - Valid encounter within last 12 months    Recent Outpatient Visits           3 months ago Insomnia secondary to depression with anxiety   [place], Vernia Buff, NP   5 months ago Essential hypertension   [place], Vernia Buff, NP   1 year ago Nonischemic cardiomyopathy [facility])   [place] Elsie Stain, MD   1 year ago Pneumonia due to infectious organism, unspecified laterality, unspecified part of lung   [place], Patrick E, MD   2 years ago Essential hypertension   [place], [place], [place]

## 2020-07-24 ENCOUNTER — Telehealth: Payer: Self-pay

## 2020-07-24 NOTE — Telephone Encounter (Signed)
Patient called in stating that he would like a letter from [place] stating he has a device and it is being checked by [place] with all the information to the device as well. Patient wants to have this information for his own reason. After getting off the phone with patient I saw that his monitor is disconnected and I have called him back with no answer and left a VM with the device clinic number to call us back.

## 2020-07-25 NOTE — Telephone Encounter (Signed)
Attempted to contact patient about request. Phone went straight to VM. LMTCB.

## 2020-07-26 NOTE — Telephone Encounter (Signed)
Spoke with pt.  Advised his monitor is not connecting per [employer].  Attempted to assist pt with sending manual transmission.  Monitor still unable to connect.  Provided patient with [employer] tech support number.  He will try calling for assistance with monitor.

## 2020-07-29 NOTE — Telephone Encounter (Signed)
[employer] website checked and monitor is not connected.   Patient has not called [employer] as of yet. States he will call this morning and call back with update. Appreciative of follow up.

## 2020-07-30 ENCOUNTER — Ambulatory Visit (HOSPITAL_COMMUNITY): Admit: 2020-07-30 | Payer: Medicaid Other | Admitting: Gastroenterology

## 2020-07-30 ENCOUNTER — Encounter (HOSPITAL_COMMUNITY): Payer: Self-pay

## 2020-07-30 SURGERY — COLONOSCOPY WITH PROPOFOL
Anesthesia: Monitor Anesthesia Care

## 2020-07-31 NOTE — Telephone Encounter (Signed)
FYI--Patient is following up.  He states he received his new monitor and he does have a connection in [place].

## 2020-07-31 NOTE — Telephone Encounter (Signed)
Spoke with patient and he states he has called [employer] and they are sending him a new monitor as his is outdated. They did not specify when he will receive it as they are on back order. Patient is aware to call the DC if he were to have any symptoms since he cannot be remotely monitored

## 2020-08-01 LAB — CUP PACEART REMOTE DEVICE CHECK
Battery Remaining Longevity: 96 mo
Battery Remaining Percentage: 87 %
Brady Statistic RV Percent Paced: 0 %
Date Time Interrogation Session: 20220623081100
HighPow Impedance: 51 Ohm
Implantable Lead Implant Date: 20150601
Implantable Lead Location: 753860
Implantable Lead Model: 295
Implantable Lead Serial Number: 134196
Implantable Pulse Generator Implant Date: 20150601
Lead Channel Impedance Value: 425 Ohm
Lead Channel Pacing Threshold Amplitude: 1.4 V
Lead Channel Pacing Threshold Pulse Width: 0.6 ms
Lead Channel Setting Pacing Amplitude: 3 V
Lead Channel Setting Pacing Pulse Width: 0.6 ms
Lead Channel Setting Sensing Sensitivity: 0.6 mV
Pulse Gen Serial Number: 190689

## 2020-08-05 NOTE — Telephone Encounter (Signed)
Transmission received 08-02-2020.

## 2020-08-08 ENCOUNTER — Telehealth: Payer: Self-pay | Admitting: Student

## 2020-08-08 DIAGNOSIS — Z0279 Encounter for issue of other medical certificate: Secondary | ICD-10-CM

## 2020-08-08 NOTE — Telephone Encounter (Signed)
The patient came into the office to drop off a form, pay $29 fee, and sign authorization. HIM received the form. The form has been placed in Dr. Norwood Levo box to be completed. AO 08/08/20

## 2020-09-12 ENCOUNTER — Other Ambulatory Visit: Payer: Self-pay | Admitting: Nurse Practitioner

## 2020-09-12 DIAGNOSIS — F418 Other specified anxiety disorders: Secondary | ICD-10-CM

## 2020-09-12 DIAGNOSIS — F5105 Insomnia due to other mental disorder: Secondary | ICD-10-CM

## 2020-09-13 NOTE — Telephone Encounter (Signed)
Requested medications are due for refill today yes  Requested medications are on the active medication list yes  Last refill 06/04/20  Last visit 03/22/20  Future visit scheduled 10/01/20, was asked to return in June and did not.  Notes to clinic Did not return in 3 months as requested, does have visit scheduled for Sept. Please assess.

## 2020-09-25 ENCOUNTER — Ambulatory Visit (INDEPENDENT_AMBULATORY_CARE_PROVIDER_SITE_OTHER): Payer: Medicaid Other

## 2020-09-25 DIAGNOSIS — I428 Other cardiomyopathies: Secondary | ICD-10-CM | POA: Diagnosis not present

## 2020-09-26 LAB — CUP PACEART REMOTE DEVICE CHECK
Battery Remaining Longevity: 90 mo
Battery Remaining Percentage: 82 %
Brady Statistic RV Percent Paced: 0 %
Date Time Interrogation Session: 20220921180700
HighPow Impedance: 48 Ohm
Implantable Lead Implant Date: 20150601
Implantable Lead Location: 753860
Implantable Lead Model: 295
Implantable Lead Serial Number: 134196
Implantable Pulse Generator Implant Date: 20150601
Lead Channel Impedance Value: 413 Ohm
Lead Channel Pacing Threshold Amplitude: 1.4 V
Lead Channel Pacing Threshold Pulse Width: 0.6 ms
Lead Channel Setting Pacing Amplitude: 3 V
Lead Channel Setting Pacing Pulse Width: 0.6 ms
Lead Channel Setting Sensing Sensitivity: 0.6 mV
Pulse Gen Serial Number: 190689

## 2020-10-01 ENCOUNTER — Encounter: Payer: Self-pay | Admitting: Nurse Practitioner

## 2020-10-01 ENCOUNTER — Other Ambulatory Visit: Payer: Self-pay

## 2020-10-01 ENCOUNTER — Ambulatory Visit: Payer: Medicaid Other | Attending: Nurse Practitioner | Admitting: Nurse Practitioner

## 2020-10-01 VITALS — BP 120/74 | HR 77 | Resp 16 | Wt 160.8 lb

## 2020-10-01 DIAGNOSIS — M1A09X0 Idiopathic chronic gout, multiple sites, without tophus (tophi): Secondary | ICD-10-CM

## 2020-10-01 DIAGNOSIS — Z8249 Family history of ischemic heart disease and other diseases of the circulatory system: Secondary | ICD-10-CM | POA: Insufficient documentation

## 2020-10-01 DIAGNOSIS — I10 Essential (primary) hypertension: Secondary | ICD-10-CM | POA: Diagnosis not present

## 2020-10-01 DIAGNOSIS — Z95810 Presence of automatic (implantable) cardiac defibrillator: Secondary | ICD-10-CM | POA: Diagnosis not present

## 2020-10-01 DIAGNOSIS — Z79899 Other long term (current) drug therapy: Secondary | ICD-10-CM | POA: Insufficient documentation

## 2020-10-01 DIAGNOSIS — Z7901 Long term (current) use of anticoagulants: Secondary | ICD-10-CM | POA: Diagnosis not present

## 2020-10-01 DIAGNOSIS — I110 Hypertensive heart disease with heart failure: Secondary | ICD-10-CM | POA: Diagnosis not present

## 2020-10-01 DIAGNOSIS — Z9119 Patient's noncompliance with other medical treatment and regimen: Secondary | ICD-10-CM | POA: Insufficient documentation

## 2020-10-01 DIAGNOSIS — I2510 Atherosclerotic heart disease of native coronary artery without angina pectoris: Secondary | ICD-10-CM | POA: Diagnosis not present

## 2020-10-01 DIAGNOSIS — Z888 Allergy status to other drugs, medicaments and biological substances status: Secondary | ICD-10-CM | POA: Diagnosis not present

## 2020-10-01 DIAGNOSIS — I5022 Chronic systolic (congestive) heart failure: Secondary | ICD-10-CM | POA: Diagnosis present

## 2020-10-01 DIAGNOSIS — D72829 Elevated white blood cell count, unspecified: Secondary | ICD-10-CM | POA: Diagnosis not present

## 2020-10-01 DIAGNOSIS — R7989 Other specified abnormal findings of blood chemistry: Secondary | ICD-10-CM

## 2020-10-01 DIAGNOSIS — Z1211 Encounter for screening for malignant neoplasm of colon: Secondary | ICD-10-CM

## 2020-10-01 DIAGNOSIS — M109 Gout, unspecified: Secondary | ICD-10-CM

## 2020-10-01 DIAGNOSIS — L811 Chloasma: Secondary | ICD-10-CM

## 2020-10-01 DIAGNOSIS — I252 Old myocardial infarction: Secondary | ICD-10-CM | POA: Diagnosis not present

## 2020-10-01 MED ORDER — TRIAMCINOLONE ACETONIDE 0.1 % EX CREA: 1.0000 "application " | TOPICAL_CREAM | Freq: Two times a day (BID) | CUTANEOUS | 1 refills | Status: DC

## 2020-10-01 MED ORDER — HYDROQUINONE 4 % EX CREA: TOPICAL_CREAM | Freq: Two times a day (BID) | CUTANEOUS | 1 refills | Status: DC

## 2020-10-01 NOTE — Progress Notes (Signed)
Assessment & Plan:  Stanley [name] was seen today for hypertension.  Diagnoses and all orders for this visit:  Essential hypertension -     CMP14+EGFR Continue all antihypertensives as prescribed.  Remember to bring in your blood pressure log with you for your follow up appointment.  DASH/Mediterranean Diets are healthier choices for HTN.    Melasma Noticing some improvement in pigment  -     hydroquinone 4 % cream; Apply topically 2 (two) times daily.  Leukocytosis, unspecified type -     CBC with Differential  Colon cancer screening -     Ambulatory referral to Gastroenterology  Acute gout of multiple sites, unspecified cause -     Uric Acid  Low thyroid stimulating hormone (TSH) level -     Thyroid Panel With TSH   Patient has been counseled on age-appropriate routine health concerns for screening and prevention. These are reviewed and up-to-date. Referrals have been placed accordingly. Immunizations are up-to-date or declined.    Subjective:   Chief Complaint  Patient presents with   Hypertension   HPI Stanley [name] 55 y.o. male presents to office today for follow up to HTN. He has a past medical history of Acute renal failure, Alcohol abuse, Anxiety, CAD, Chronic systolic CHF, Depression, Financial difficulties, Gout, History of noncompliance with medical treatment, HTN,  ICD, Insomnia, Lobar pneumonia (12/28/2018), MI (2005), Sleep apnea, and Ventricular tachycardia (01/2015).    HTN Blood pressure is well controlled. He is taking is taking spironolactone 25 mg daily, Bidil 20.37.5 mg TID losartan 100 g daily, carvedilol 25 mg BID, digoxin 0.125 mg daily, furosemide 80 mg daily, and amiodarone 200 mg daily. Denies chest pain, shortness of breath, palpitations, lightheadedness, dizziness, headaches or BLE edema.   BP Readings from Last 3 Encounters:  10/01/20 120/74  05/01/20 118/78  04/23/20 116/70     Dyslipidemia Not taking cholesterol medication. LDL not  at goal.  Lab Results  Component Value Date   LDLCALC 171 (H) 02/19/2020     Endorses right arm pain. Wants to be checked for Gout today.   Low TSH Continues low and decreased from last draw. Will refer to endo Lab Results  Component Value Date   TSH 0.088 (L) 10/01/2020    Review of Systems  Constitutional:  Negative for fever, malaise/fatigue and weight loss.  HENT: Negative.  Negative for nosebleeds.   Eyes: Negative.  Negative for blurred vision, double vision and photophobia.  Respiratory: Negative.  Negative for cough and shortness of breath.   Cardiovascular: Negative.  Negative for chest pain, palpitations and leg swelling.  Gastrointestinal: Negative.  Negative for heartburn, nausea and vomiting.  Musculoskeletal:  Positive for joint pain. Negative for myalgias.  Skin:  Positive for rash.  Neurological: Negative.  Negative for dizziness, focal weakness, seizures and headaches.  Psychiatric/Behavioral: Negative.  Negative for suicidal ideas.    Past Medical History:  Diagnosis Date   Acute renal failure (ARF) ([place])    Acute respiratory failure with hypoxia ([place]) 12/29/2018   Alcohol abuse    Anxiety    CAD (coronary artery disease)    a. dx unclear, reported PCI in 2011 but cath 2014 normal coronaries and cardiac CT 07/2016 normal coronaries, no mention of stents.   Chronic chest pain    Chronic systolic CHF (congestive heart failure) ([place])    Depression    Dyspnea    07/05/2019: per patient some times with exertion, "has to catch breath"   Financial difficulties  Gout    Gouty arthritis    Headache    History of noncompliance with medical treatment    financial challenges   Hypertension    ICD (implantable cardioverter-defibrillator) in place    [place] Sci ICD implant done in [place] 2015   Insomnia    Lobar pneumonia (H[facility]) 12/28/2018   Myocardial infarction (HCC) 2005   Nonischemic cardiomyopathy (HCC)    Sleep apnea    per patient, has CPAP does not wear it  every night   Ventricular tachycardia (H[facility]) 01/2015   2017 - ATP delivered for sinus tach, degenerated into VT for which he received shock. Recurrent VT 03/2018.    Past Surgical History:  Procedure Laterality Date   CARDIAC DEFIBRILLATOR PLACEMENT  06/05/13   [employer] Inogen ICD implanted at [facility] in [place] for primary prevention   HERNIA REPAIR     PERCUTANEOUS CORONARY STENT INTERVENTION (PCI-S)  2011    Family History  Problem Relation Age of Onset   Cancer Mother        Pancreatic   Hypertension Father    Alzheimer's disease Father    Gout Father    Dementia Father    Hypertension Sister    Clotting disorder Sister    Obesity Brother    Bronchitis Brother    Heart disease Maternal Grandmother    Gout Paternal Grandfather    Colon polyps Neg Hx    Colon cancer Neg Hx     Social History Reviewed with no changes to be made today.   Outpatient Medications Prior to Visit  Medication Sig Dispense Refill   amiodarone (PACERONE) 200 MG tablet TAKE 1 TABLET(200 MG) BY MOUTH DAILY 90 tablet 3   carvedilol (COREG) 25 MG tablet TAKE 1 TABLET(25 MG) BY MOUTH TWICE DAILY WITH A MEAL 180 tablet 3   digoxin (LANOXIN) 0.125 MG tablet Take 1 tablet (0.125 mg total) by mouth daily. 90 tablet 3   furosemide (LASIX) 80 MG tablet TAKE 1 TABLET BY MOUTH ON MONDAY, WEDNESDAY& FRIDAY. MAY TAKE 1 TABLET AS NEEDED FOR SWELLING 30 tablet 6   isosorbide-hydrALAZINE (BIDIL) 20-37.5 MG tablet TAKE 1 TABLET BY MOUTH THREE TIMES DAILY 90 tablet 0   losartan (COZAAR) 100 MG tablet Take 1 tablet (100 mg total) by mouth daily. 30 tablet 6   nitroGLYCERIN (NITROSTAT) 0.4 MG SL tablet Place 1 tablet (0.4 mg total) under the tongue every 5 (five) minutes x 3 doses as needed for chest pain. 30 tablet 6   pantoprazole (PROTONIX) 40 MG tablet Take 1 tablet (40 mg total) by mouth daily. 30 tablet 5   potassium chloride SA (KLOR-CON) 20 MEQ tablet Take 2 tablets (40 mEq total) by mouth  daily. 180 tablet 3   spironolactone (ALDACTONE) 25 MG tablet Take 1 tablet (25 mg total) by mouth daily. 90 tablet 3   traZODone (DESYREL) 150 MG tablet TAKE 1 TABLET(150 MG) BY MOUTH AT BEDTIME 30 tablet 0   atorvastatin (LIPITOR) 20 MG tablet Take 1 tablet (20 mg total) by mouth daily. (Patient not taking: Reported on 10/01/2020) 90 tablet 3   sodium chloride (OCEAN) 0.65 % SOLN nasal spray Place 1 spray into both nostrils daily as needed for congestion. (Patient not taking: Reported on 10/01/2020)     hydroquinone 4 % cream Apply topically 2 (two) times daily. (Patient not taking: Reported on 10/01/2020) 50 g 1   No facility-administered medications prior to visit.    Allergies  Allergen Reactions  Lisinopril Shortness Of Breath    Makes lips swell   Apple Swelling    Lip Swelling   Entresto [Sacubitril-Valsartan]     History of angioedema   Other Swelling    Nuts - Lip Swelling   Shellfish Allergy Swelling    Lip Swelling       Objective:    BP 120/74   Pulse 77   Resp 16   Wt 160 lb 12.8 oz (72.9 kg)   SpO2 98%   BMI 26.76 kg/m  Wt Readings from Last 3 Encounters:  10/01/20 160 lb 12.8 oz (72.9 kg)  05/24/20 180 lb (81.6 kg)  05/01/20 180 lb (81.6 kg)    Physical Exam Vitals and nursing note reviewed.  Constitutional:      Appearance: He is well-developed.  HENT:     Head: Normocephalic and atraumatic.  Cardiovascular:     Rate and Rhythm: Normal rate and regular rhythm.     Heart sounds: Normal heart sounds. No murmur heard.   No friction rub. No gallop.  Pulmonary:     Effort: Pulmonary effort is normal. No tachypnea or respiratory distress.     Breath sounds: Normal breath sounds. No decreased breath sounds, wheezing, rhonchi or rales.  Chest:     Chest wall: No tenderness.  Abdominal:     General: Bowel sounds are normal.     Palpations: Abdomen is soft.  Musculoskeletal:        General: Normal range of motion.     Cervical back: Normal range of  motion.  Skin:    General: Skin is warm and dry.     Comments: Melasma left side of face  Neurological:     Mental Status: He is alert and oriented to person, place, and time.     Coordination: Coordination normal.  Psychiatric:        Behavior: Behavior normal. Behavior is cooperative.        Thought Content: Thought content normal.        Judgment: Judgment normal.         Patient has been counseled extensively about nutrition and exercise as well as the importance of adherence with medications and regular follow-up. The patient was given clear instructions to go to ER or return to medical center if symptoms don't improve, worsen or new problems develop. The patient verbalized understanding.   Follow-up: Return in about 3 months (around 12/31/2020).   Gildardo Pounds, FNP-BC [facility] [place], [place]   10/03/2020, 3:53 PM

## 2020-10-02 LAB — CBC WITH DIFFERENTIAL/PLATELET
Basophils Absolute: 0.1 10*3/uL (ref 0.0–0.2)
Basos: 0 %
EOS (ABSOLUTE): 0.2 10*3/uL (ref 0.0–0.4)
Eos: 2 %
Hematocrit: 36.3 % — ABNORMAL LOW (ref 37.5–51.0)
Hemoglobin: 12.2 g/dL — ABNORMAL LOW (ref 13.0–17.7)
Immature Grans (Abs): 0.1 10*3/uL (ref 0.0–0.1)
Immature Granulocytes: 1 %
Lymphocytes Absolute: 2.6 10*3/uL (ref 0.7–3.1)
Lymphs: 16 %
MCH: 33.9 pg — ABNORMAL HIGH (ref 26.6–33.0)
MCHC: 33.6 g/dL (ref 31.5–35.7)
MCV: 101 fL — ABNORMAL HIGH (ref 79–97)
Monocytes Absolute: 1 10*3/uL — ABNORMAL HIGH (ref 0.1–0.9)
Monocytes: 6 %
Neutrophils Absolute: 11.8 10*3/uL — ABNORMAL HIGH (ref 1.4–7.0)
Neutrophils: 75 %
Platelets: 298 10*3/uL (ref 150–450)
RBC: 3.6 x10E6/uL — ABNORMAL LOW (ref 4.14–5.80)
RDW: 14.6 % (ref 11.6–15.4)
WBC: 15.7 10*3/uL — ABNORMAL HIGH (ref 3.4–10.8)

## 2020-10-02 LAB — URIC ACID: Uric Acid: 13.1 mg/dL — ABNORMAL HIGH (ref 3.8–8.4)

## 2020-10-02 LAB — CMP14+EGFR
ALT: 13 IU/L (ref 0–44)
AST: 21 IU/L (ref 0–40)
Albumin/Globulin Ratio: 1.2 (ref 1.2–2.2)
Albumin: 4 g/dL (ref 3.8–4.9)
Alkaline Phosphatase: 76 IU/L (ref 44–121)
BUN/Creatinine Ratio: 15 (ref 9–20)
BUN: 14 mg/dL (ref 6–24)
Bilirubin Total: 0.3 mg/dL (ref 0.0–1.2)
CO2: 23 mmol/L (ref 20–29)
Calcium: 9.4 mg/dL (ref 8.7–10.2)
Chloride: 99 mmol/L (ref 96–106)
Creatinine, Ser: 0.96 mg/dL (ref 0.76–1.27)
Globulin, Total: 3.4 g/dL (ref 1.5–4.5)
Glucose: 91 mg/dL (ref 70–99)
Potassium: 3 mmol/L — ABNORMAL LOW (ref 3.5–5.2)
Sodium: 141 mmol/L (ref 134–144)
Total Protein: 7.4 g/dL (ref 6.0–8.5)
eGFR: 92 mL/min/{1.73_m2} (ref >59)

## 2020-10-02 LAB — THYROID PANEL WITH TSH
Free Thyroxine Index: 2 (ref 1.2–4.9)
T3 Uptake Ratio: 33 % (ref 24–39)
T4, Total: 6.1 ug/dL (ref 4.5–12.0)
TSH: 0.088 u[IU]/mL — ABNORMAL LOW (ref 0.450–4.500)

## 2020-10-02 NOTE — Progress Notes (Signed)
Remote ICD transmission.   

## 2020-10-03 ENCOUNTER — Encounter: Payer: Self-pay | Admitting: Nurse Practitioner

## 2020-10-03 MED ORDER — POTASSIUM CHLORIDE CRYS ER 20 MEQ PO TBCR: 40.0000 meq | EXTENDED_RELEASE_TABLET | Freq: Every day | ORAL | 3 refills | Status: DC

## 2020-10-03 MED ORDER — ALLOPURINOL 100 MG PO TABS: 100.0000 mg | ORAL_TABLET | Freq: Every day | ORAL | 6 refills | Status: DC

## 2020-10-03 MED ORDER — PREDNISONE 10 MG PO TABS: 30.0000 mg | ORAL_TABLET | Freq: Every day | ORAL | 0 refills | Status: AC

## 2020-10-07 ENCOUNTER — Encounter (HOSPITAL_COMMUNITY): Payer: Medicaid Other | Admitting: Internal Medicine

## 2020-10-19 ENCOUNTER — Other Ambulatory Visit: Payer: Self-pay | Admitting: Nurse Practitioner

## 2020-10-19 DIAGNOSIS — F5105 Insomnia due to other mental disorder: Secondary | ICD-10-CM

## 2020-10-19 DIAGNOSIS — F418 Other specified anxiety disorders: Secondary | ICD-10-CM

## 2020-10-19 NOTE — Telephone Encounter (Signed)
Requested medication (s) are due for refill today: yes  Requested medication (s) are on the active medication list: yes  Last refill:  09/13/20 #30  Future visit scheduled: 01/01/21  Notes to clinic:  note attached to last RF- no more refills until office visit   Requested Prescriptions  Pending Prescriptions Disp Refills   traZODone (DESYREL) 150 MG tablet [Pharmacy Med Name: TRAZODONE 150MG  (HUNDRED-FIFTY) TAB] 90 tablet     Sig: TAKE 1 TABLET(150 MG) BY MOUTH AT BEDTIME     Psychiatry: Antidepressants - Serotonin Modulator Passed - 10/19/2020  3:01 PM      Passed - Completed PHQ-2 or PHQ-9 in the last 360 days      Passed - Valid encounter within last 6 months    Recent Outpatient Visits           2 weeks ago Essential hypertension   [place], [place] W, NP   7 months ago Insomnia secondary to depression with anxiety   [place], Vernia Buff, NP   8 months ago Essential hypertension   [place], Vernia Buff, NP   1 year ago Nonischemic cardiomyopathy [facility])   [place] Elsie Stain, MD   1 year ago Pneumonia due to infectious organism, unspecified laterality, unspecified part of lung   [place], MD       Future Appointments             In 1 week [place], Satira Mccallum, PA-C [facility], [facility]   In 2 months Gildardo Pounds, NP [place]

## 2020-10-25 NOTE — Progress Notes (Signed)
Electrophysiology Office Note Date: 10/28/2020  ID:  Stanley [name], DOB September 18, 1965, MRN [id number]  PCP: Gildardo Pounds, NP Primary Cardiologist: Glori Bickers, MD Electrophysiologist: Thompson Grayer, MD   CC: Routine ICD follow-up  Stanley [name] is a 55 y.o. male seen today for Thompson Grayer, MD for routine electrophysiology followup.  Since last being seen in our clinic the patient reports doing about the same. He has chronic, intermittent atypical chest pain. SOB with more than mild exertion at baseline, though at sometimes does better. Sister has moved to town and is helping him.  He continues to have intermittent fullness/discomfort in his R neck with looking in certain directions or forceful swallowing.   Device History: Barostim (standard) implanted 07/07/2019 for Chronic systolic CHF Boston Scientific single chamber ICD 06/05/2013 for chronic systolic CHF  Past Medical History:  Diagnosis Date   Acute renal failure (ARF) ([place])    Acute respiratory failure with hypoxia ([place]) 12/29/2018   Alcohol abuse    Anxiety    CAD (coronary artery disease)    a. dx unclear, reported PCI in 2011 but cath 2014 normal coronaries and cardiac CT 07/2016 normal coronaries, no mention of stents.   Chronic chest pain    Chronic systolic CHF (congestive heart failure) ([place])    Depression    Dyspnea    07/05/2019: per patient some times with exertion, "has to catch breath"   Financial difficulties    Gout    Gouty arthritis    Headache    History of noncompliance with medical treatment    financial challenges   Hypertension    ICD (implantable cardioverter-defibrillator) in place    [place] Sci ICD implant done in [place] 2015   Insomnia    Lobar pneumonia ([place]) 12/28/2018   Myocardial infarction ([place]) 2005   Nonischemic cardiomyopathy ([place])    Sleep apnea    per patient, has CPAP does not wear it every night   Ventricular tachycardia 01/2015   2017 - ATP delivered for sinus tach,  degenerated into VT for which he received shock. Recurrent VT 03/2018.   Past Surgical History:  Procedure Laterality Date   CARDIAC DEFIBRILLATOR PLACEMENT  06/05/13   Boston Scientific Inogen ICD implanted at [facility] in [place] for primary prevention   HERNIA REPAIR     PERCUTANEOUS CORONARY STENT INTERVENTION (PCI-S)  2011    Current Outpatient Medications  Medication Sig Dispense Refill   amiodarone (PACERONE) 200 MG tablet TAKE 1 TABLET(200 MG) BY MOUTH DAILY 90 tablet 3   carvedilol (COREG) 25 MG tablet TAKE 1 TABLET(25 MG) BY MOUTH TWICE DAILY WITH A MEAL 180 tablet 3   digoxin (LANOXIN) 0.125 MG tablet Take 1 tablet (0.125 mg total) by mouth daily. 90 tablet 3   furosemide (LASIX) 80 MG tablet TAKE 1 TABLET BY MOUTH ON MONDAY, WEDNESDAY& FRIDAY. MAY TAKE 1 TABLET AS NEEDED FOR SWELLING 30 tablet 6   hydroquinone 4 % cream Apply topically 2 (two) times daily. (Patient taking differently: Apply topically as needed. Skin discoloration) 50 g 1   isosorbide-hydrALAZINE (BIDIL) 20-37.5 MG tablet TAKE 1 TABLET BY MOUTH THREE TIMES DAILY 90 tablet 0   losartan (COZAAR) 100 MG tablet Take 1 tablet (100 mg total) by mouth daily. 30 tablet 6   nitroGLYCERIN (NITROSTAT) 0.4 MG SL tablet Place 1 tablet (0.4 mg total) under the tongue every 5 (five) minutes x 3 doses as needed for chest pain. 30 tablet 6   pantoprazole (PROTONIX) 40  MG tablet Take 1 tablet (40 mg total) by mouth daily. (Patient taking differently: Take 40 mg by mouth as needed (heartburn).) 30 tablet 5   potassium chloride SA (KLOR-CON) 20 MEQ tablet Take 2 tablets (40 mEq total) by mouth daily. 180 tablet 3   sodium chloride (OCEAN) 0.65 % SOLN nasal spray Place 1 spray into both nostrils daily as needed for congestion.     spironolactone (ALDACTONE) 25 MG tablet Take 1 tablet (25 mg total) by mouth daily. 90 tablet 3   traZODone (DESYREL) 150 MG tablet TAKE 1 TABLET(150 MG) BY MOUTH AT BEDTIME 90 tablet 1    allopurinol (ZYLOPRIM) 100 MG tablet Take 1 tablet (100 mg total) by mouth daily. FOR GOUT (Patient not taking: Reported on 10/28/2020) 30 tablet 6   atorvastatin (LIPITOR) 20 MG tablet Take 1 tablet (20 mg total) by mouth daily. (Patient not taking: No sig reported) 90 tablet 3   No current facility-administered medications for this visit.    Allergies:   Lisinopril, Allopurinol, Apple, Entresto [sacubitril-valsartan], Other, and Shellfish allergy   Social History: Social History   Socioeconomic History   Marital status: Divorced    Spouse name: Not on file   Number of children: 1   Years of education: 12   Highest education level: Not on file  Occupational History   Occupation: Disability  Tobacco Use   Smoking status: Never   Smokeless tobacco: Never  Vaping Use   Vaping Use: Never used  Substance and Sexual Activity   Alcohol use: Yes    Alcohol/week: 0.0 standard drinks    Comment: rarely   Drug use: No   Sexual activity: Not Currently  Other Topics Concern   Not on file  Social History Narrative   Lives in [place] alone.  Disabled.  Previously worked as a Careers adviser.   Fun: Rest, Read and watch movies.    Social Determinants of Health   Financial Resource Strain: Not on file  Food Insecurity: Not on file  Transportation Needs: Not on file  Physical Activity: Not on file  Stress: Not on file  Social Connections: Not on file  Intimate Partner Violence: Not on file    Family History: Family History  Problem Relation Age of Onset   Cancer Mother        Pancreatic   Hypertension Father    Alzheimer's disease Father    Gout Father    Dementia Father    Hypertension Sister    Clotting disorder Sister    Obesity Brother    Bronchitis Brother    Heart disease Maternal Grandmother    Gout Paternal Grandfather    Colon polyps Neg Hx    Colon cancer Neg Hx    Review of Systems: All other systems reviewed and are otherwise negative except as  noted above.  Physical Exam: Vitals:   10/28/20 1150  BP: 130/68  Pulse: 73  SpO2: 99%  Weight: 161 lb (73 kg)  Height: 5\' 5"  (1.651 m)     GEN- The patient is well appearing, alert and oriented x 3 today.   HEENT: normocephalic, atraumatic; sclera clear, conjunctiva pink; hearing intact; oropharynx clear; neck supple, no JVP Lymph- no cervical lymphadenopathy Lungs- Clear to ausculation bilaterally, normal work of breathing.  No wheezes, rales, rhonchi Heart- Regular rate and rhythm, no murmurs, rubs or gallops, PMI not laterally displaced GI- soft, non-tender, non-distended, bowel sounds present, no hepatosplenomegaly Extremities- no clubbing or cyanosis. No edema; DP/PT/radial pulses 2+  bilaterally MS- no significant deformity or atrophy Skin- warm and dry, no rash or lesion; ICD pocket well healed Psych- euthymic mood, full affect Neuro- strength and sensation are intact  ICD interrogation- reviewed in detail today,  See PACEART report  EKG:  EKG is not ordered today.  Recent Labs: 05/24/2020: NT-Pro BNP 180 10/01/2020: ALT 13; BUN 14; Creatinine, Ser 0.96; Hemoglobin 12.2; Platelets 298; Potassium 3.0; Sodium 141; TSH 0.088   Wt Readings from Last 3 Encounters:  10/28/20 161 lb (73 kg)  10/01/20 160 lb 12.8 oz (72.9 kg)  05/24/20 180 lb (81.6 kg)     Other studies Reviewed: Additional studies/ records that were reviewed today include: Previous EP office notes.   Assessment and Plan:  1.  Chronic systolic CHF s/p Barostim (standard) and [employer] Sci single chamber ICD NYHA III chronically. euvolemic today Stable on an appropriate medical regimen Barostim programmed to 5.0 ma with resolution of his reproducible stim.  Normal ICD function See Claudia Desanctis Art report No changes today  2. HTN Stable on current regimen. Chronic issues with compliance.   3. Hypokalemia K 3.0 10/01/2020 Taking K 20 meq daily currently. BMET today.   4. Atypical chest pain Chronic.   Myoview 05/2020 with fixed chronic, persistent defect. No ischemia.   5. H/o VT Quiescent recently Amio surveillance labs stable 10/01/20.  Current medicines are reviewed at length with the patient today.    Labs/ tests ordered today include:  Orders Placed This Encounter  Procedures   Basic metabolic panel   Digoxin level   Disposition:   Follow up with EP APP in 6 months    Signed, Shirley Friar, PA-C  10/28/2020 12:04 PM  [place] [phone] (office) [phone] (fax)

## 2020-10-28 ENCOUNTER — Other Ambulatory Visit: Payer: Self-pay

## 2020-10-28 ENCOUNTER — Ambulatory Visit (INDEPENDENT_AMBULATORY_CARE_PROVIDER_SITE_OTHER): Payer: Medicaid Other | Admitting: Student

## 2020-10-28 ENCOUNTER — Encounter: Payer: Self-pay | Admitting: Student

## 2020-10-28 ENCOUNTER — Telehealth: Payer: Self-pay

## 2020-10-28 VITALS — BP 130/68 | HR 73 | Ht 65.0 in | Wt 161.0 lb

## 2020-10-28 DIAGNOSIS — I472 Ventricular tachycardia, unspecified: Secondary | ICD-10-CM

## 2020-10-28 DIAGNOSIS — I428 Other cardiomyopathies: Secondary | ICD-10-CM

## 2020-10-28 DIAGNOSIS — E876 Hypokalemia: Secondary | ICD-10-CM

## 2020-10-28 DIAGNOSIS — I519 Heart disease, unspecified: Secondary | ICD-10-CM

## 2020-10-28 DIAGNOSIS — R079 Chest pain, unspecified: Secondary | ICD-10-CM

## 2020-10-28 MED ORDER — DIGOXIN 125 MCG PO TABS: 0.1250 mg | ORAL_TABLET | Freq: Every day | ORAL | 3 refills | Status: DC

## 2020-10-28 NOTE — Telephone Encounter (Signed)
Pt advised his Low K 3.4 and per Dr. Irish Lack the DOD pt to take K 40 meq po bis X1 day and to return in one week for repeat labs.

## 2020-10-28 NOTE — Patient Instructions (Signed)
Medication Instructions:  Your physician recommends that you continue on your current medications as directed. Please refer to the Current Medication list given to you today.  *If you need a refill on your cardiac medications before your next appointment, please call your pharmacy*   Lab Work: TODAY: BMET, Digoxin level  If you have labs (blood work) drawn today and your tests are completely normal, you will receive your results only by: [place] (if you have MyChart) OR A paper copy in the mail If you have any lab test that is abnormal or we need to change your treatment, we will call you to review the results.   Follow-Up: At [facility], you and your health needs are our priority.  As part of our continuing mission to provide you with exceptional heart care, we have created designated Provider Care Teams.  These Care Teams include your primary Cardiologist (physician) and Advanced Practice Providers (APPs -  Physician Assistants and Nurse Practitioners) who all work together to provide you with the care you need, when you need it.   Your next appointment:   6 month(s)  The format for your next appointment:   In Person  Provider:   You may see Thompson Grayer, MD or one of the following Advanced Practice Providers on your designated Care Team:   Tommye Standard, [place] Legrand Como "[facility]" [place], [place]

## 2020-10-29 ENCOUNTER — Other Ambulatory Visit: Payer: Self-pay

## 2020-10-29 DIAGNOSIS — I472 Ventricular tachycardia, unspecified: Secondary | ICD-10-CM

## 2020-10-29 LAB — BASIC METABOLIC PANEL
BUN/Creatinine Ratio: 10 (ref 9–20)
BUN: 9 mg/dL (ref 6–24)
CO2: 23 mmol/L (ref 20–29)
Calcium: 8.8 mg/dL (ref 8.7–10.2)
Chloride: 101 mmol/L (ref 96–106)
Creatinine, Ser: 0.94 mg/dL (ref 0.76–1.27)
Glucose: 116 mg/dL — ABNORMAL HIGH (ref 70–99)
Potassium: 3.1 mmol/L — ABNORMAL LOW (ref 3.5–5.2)
Sodium: 141 mmol/L (ref 134–144)
eGFR: 95 mL/min/{1.73_m2} (ref >59)

## 2020-10-29 LAB — DIGOXIN LEVEL: Digoxin, Serum: 1 ng/mL — ABNORMAL HIGH (ref 0.5–0.9)

## 2020-10-29 MED ORDER — DIGOXIN 125 MCG PO TABS: 0.0625 mg | ORAL_TABLET | Freq: Every day | ORAL | 3 refills | Status: DC

## 2020-11-04 ENCOUNTER — Other Ambulatory Visit: Payer: Medicaid Other | Admitting: *Deleted

## 2020-11-04 ENCOUNTER — Other Ambulatory Visit: Payer: Self-pay

## 2020-11-04 DIAGNOSIS — E876 Hypokalemia: Secondary | ICD-10-CM

## 2020-11-05 ENCOUNTER — Other Ambulatory Visit: Payer: Self-pay

## 2020-11-05 DIAGNOSIS — E876 Hypokalemia: Secondary | ICD-10-CM

## 2020-11-05 LAB — BASIC METABOLIC PANEL
BUN/Creatinine Ratio: 11 (ref 9–20)
BUN: 10 mg/dL (ref 6–24)
CO2: 20 mmol/L (ref 20–29)
Calcium: 9.7 mg/dL (ref 8.7–10.2)
Chloride: 104 mmol/L (ref 96–106)
Creatinine, Ser: 0.9 mg/dL (ref 0.76–1.27)
Glucose: 132 mg/dL — ABNORMAL HIGH (ref 70–99)
Potassium: 3 mmol/L — ABNORMAL LOW (ref 3.5–5.2)
Sodium: 141 mmol/L (ref 134–144)
eGFR: 100 mL/min/{1.73_m2} (ref >59)

## 2020-11-05 MED ORDER — POTASSIUM CHLORIDE CRYS ER 20 MEQ PO TBCR: 60.0000 meq | EXTENDED_RELEASE_TABLET | Freq: Every day | ORAL | 3 refills | Status: DC

## 2020-11-08 ENCOUNTER — Encounter: Payer: Self-pay | Admitting: Gastroenterology

## 2020-11-13 ENCOUNTER — Other Ambulatory Visit: Payer: Medicaid Other

## 2020-12-12 ENCOUNTER — Encounter: Payer: Self-pay | Admitting: Gastroenterology

## 2020-12-12 ENCOUNTER — Ambulatory Visit (INDEPENDENT_AMBULATORY_CARE_PROVIDER_SITE_OTHER): Payer: Medicaid Other | Admitting: Gastroenterology

## 2020-12-12 VITALS — BP 124/70 | HR 68 | Ht 65.0 in | Wt 162.0 lb

## 2020-12-12 DIAGNOSIS — R6881 Early satiety: Secondary | ICD-10-CM | POA: Diagnosis not present

## 2020-12-12 DIAGNOSIS — K219 Gastro-esophageal reflux disease without esophagitis: Secondary | ICD-10-CM

## 2020-12-12 DIAGNOSIS — R1310 Dysphagia, unspecified: Secondary | ICD-10-CM

## 2020-12-12 DIAGNOSIS — I509 Heart failure, unspecified: Secondary | ICD-10-CM

## 2020-12-12 DIAGNOSIS — R1013 Epigastric pain: Secondary | ICD-10-CM | POA: Diagnosis not present

## 2020-12-12 DIAGNOSIS — Z1211 Encounter for screening for malignant neoplasm of colon: Secondary | ICD-10-CM

## 2020-12-12 MED ORDER — ONDANSETRON 4 MG PO TBDP: 4.0000 mg | ORAL_TABLET | Freq: Three times a day (TID) | ORAL | 1 refills | Status: DC | PRN

## 2020-12-12 MED ORDER — SUTAB 1479-225-188 MG PO TABS: 1.0000 | ORAL_TABLET | Freq: Once | ORAL | 0 refills | Status: AC

## 2020-12-12 MED ORDER — PANTOPRAZOLE SODIUM 40 MG PO TBEC: 40.0000 mg | DELAYED_RELEASE_TABLET | Freq: Two times a day (BID) | ORAL | 3 refills | Status: DC

## 2020-12-12 NOTE — Patient Instructions (Addendum)
If you are age 57 or older, your body mass index should be between 23-30. Your Body mass index is 26.96 kg/m. If this is out of the aforementioned range listed, please consider follow up with your Primary Care Provider.  If you are age 85 or younger, your body mass index should be between 19-25. Your Body mass index is 26.96 kg/m. If this is out of the aformentioned range listed, please consider follow up with your Primary Care Provider.   ________________________________________________________  The [place] [facility] providers would like to encourage you to use [facility] to communicate with providers for non-urgent requests or questions.  Due to long hold times on the telephone, sending your provider a message by [facility] may be a faster and more efficient way to get a response.  Please allow 48 business hours for a response.  Please remember that this is for non-urgent requests.  _______________________________________________________  Stanley [name] have been scheduled for an endoscopy and colonoscopy on March 2nd at 11:15 am.  You will need to arrive at 9:45 am. Please follow the written instructions given to you at your visit today. Please pick up your prep supplies at the pharmacy within the next 1-3 days. If you use inhalers (even only as needed), please bring them with you on the day of your procedure.  Increase your Protonix to twice daily.  We have sent the following medications to your pharmacy for you to pick up at your convenience: Zofran 4 mg ODT: Take every 8 hours as needed  Thank you for entrusting me with your care and for choosing [facility], Dr. Reubens Cellar

## 2020-12-12 NOTE — Progress Notes (Signed)
HPI :  55 y/o male here for a follow up visit.   The patient was last seen in April of this year for epigastric pain, reflux, that was not improving with Nexium.  He had also had his last colonoscopy more than 10 years ago and was scheduled for a screening colonoscopy.  He has a history of CHF with ICD in place for history of VT, as well as Barostim.  Given his cardiovascular history he was scheduled to have this done at the hospital.  Unfortunately his procedures were scheduled in May and then again in July but both were canceled by the patient.  He is here to be reassessed prior to rescheduling his exams.  He states that he continues to have some poor appetite and some intermittent epigastric discomfort that comes and goes.  He states he will have epigastric pain about 4 days a week, will last for about 30 minutes at a time and then goes away.  He does not think that it is related to eating.  However he does have a decreased appetite with some nausea and has occasional vomiting.  He also has early satiety and has been eating small amounts of food to minimize his symptoms.  He does have some reflux that he endorses and states the Protonix does help some dosed at 40 mg once daily.  He does have some occasional dysphagia to both solids and liquids at his lower chest that has been ongoing.  He thinks he is lost about 7 or 8 pounds since we have last seen him, he is not trying to lose weight.  He is overdue for his colonoscopy which was last done in 2012.  He states he has a few loose stools daily.  Has endorsed loose stools for years.  Does not appear to bother him too much but consistently is just loose at baseline.  No blood in his stools.  No family history of colon cancer.  From a cardiovascular standpoint he states he is stable.  He denies any new cardiopulmonary symptoms.  He had nuclear stress done this past May 2022 which showed medium to moderate scar consistent with previous stress test in 2018  with an EF of 34%.  He is followed closely by cardiology.      Past Medical History:  Diagnosis Date   Acute renal failure (ARF) ([place])    Acute respiratory failure with hypoxia ([place]) 12/29/2018   Alcohol abuse    Anxiety    CAD (coronary artery disease)    a. dx unclear, reported PCI in 2011 but cath 2014 normal coronaries and cardiac CT 07/2016 normal coronaries, no mention of stents.   Chronic chest pain    Chronic systolic CHF (congestive heart failure) ([place])    Depression    Dyspnea    07/05/2019: per patient some times with exertion, "has to catch breath"   Financial difficulties    Gout    Gouty arthritis    Headache    History of noncompliance with medical treatment    financial challenges   Hypertension    ICD (implantable cardioverter-defibrillator) in place    [place] Sci ICD implant done in [place] 2015   Insomnia    Lobar pneumonia ([place]) 12/28/2018   Myocardial infarction ([place]) 2005   Nonischemic cardiomyopathy ([place])    Sleep apnea    per patient, has CPAP does not wear it every night   Ventricular tachycardia 01/2015   2017 - ATP delivered for sinus tach,  degenerated into VT for which he received shock. Recurrent VT 03/2018.     Past Surgical History:  Procedure Laterality Date   BAROREFLEX SYSTEM INSERTION     CARDIAC DEFIBRILLATOR PLACEMENT  06/05/2013   [employer] Inogen ICD implanted at [facility] in [place] for primary prevention   [place] (PCI-S)  01/05/2009   Family History  Problem Relation Age of Onset   Breast cancer Mother    Pancreatic cancer Mother    Cancer Mother        Pancreatic   Hypertension Father    Alzheimer's disease Father    Gout Father    Dementia Father    Hypertension Sister    Clotting disorder Sister    Obesity Brother    Bronchitis Brother    Heart disease Maternal Grandmother    Gout Paternal Grandfather    Colon polyps Neg Hx    Colon cancer Neg Hx     Esophageal cancer Neg Hx    Stomach cancer Neg Hx    Social History   Tobacco Use   Smoking status: Never   Smokeless tobacco: Never  Vaping Use   Vaping Use: Never used  Substance Use Topics   Alcohol use: Yes    Alcohol/week: 0.0 standard drinks    Comment: rarely   Drug use: No   Current Outpatient Medications  Medication Sig Dispense Refill   allopurinol (ZYLOPRIM) 100 MG tablet Take 1 tablet (100 mg total) by mouth daily. FOR GOUT 30 tablet 6   amiodarone (PACERONE) 200 MG tablet TAKE 1 TABLET(200 MG) BY MOUTH DAILY 90 tablet 3   atorvastatin (LIPITOR) 20 MG tablet Take 1 tablet (20 mg total) by mouth daily. 90 tablet 3   carvedilol (COREG) 25 MG tablet TAKE 1 TABLET(25 MG) BY MOUTH TWICE DAILY WITH A MEAL 180 tablet 3   digoxin (LANOXIN) 0.125 MG tablet Take 0.5 tablets (0.0625 mg total) by mouth daily. 45 tablet 3   furosemide (LASIX) 80 MG tablet TAKE 1 TABLET BY MOUTH ON MONDAY, WEDNESDAY& FRIDAY. MAY TAKE 1 TABLET AS NEEDED FOR SWELLING 30 tablet 6   hydroquinone 4 % cream Apply topically 2 (two) times daily. (Patient taking differently: Apply topically as needed. Skin discoloration) 50 g 1   isosorbide-hydrALAZINE (BIDIL) 20-37.5 MG tablet TAKE 1 TABLET BY MOUTH THREE TIMES DAILY 90 tablet 0   losartan (COZAAR) 100 MG tablet Take 1 tablet (100 mg total) by mouth daily. (Patient taking differently: Take 100 mg by mouth 2 (two) times daily.) 30 tablet 6   nitroGLYCERIN (NITROSTAT) 0.4 MG SL tablet Place 1 tablet (0.4 mg total) under the tongue every 5 (five) minutes x 3 doses as needed for chest pain. 30 tablet 6   pantoprazole (PROTONIX) 40 MG tablet Take 1 tablet (40 mg total) by mouth daily. (Patient taking differently: Take 40 mg by mouth as needed (heartburn).) 30 tablet 5   potassium chloride SA (KLOR-CON) 20 MEQ tablet Take 3 tablets (60 mEq total) by mouth daily. 180 tablet 3   sodium chloride (OCEAN) 0.65 % SOLN nasal spray Place 1 spray into both nostrils daily as  needed for congestion.     spironolactone (ALDACTONE) 25 MG tablet Take 1 tablet (25 mg total) by mouth daily. (Patient taking differently: Take 25 mg by mouth as needed.) 90 tablet 3   traZODone (DESYREL) 150 MG tablet TAKE 1 TABLET(150 MG) BY MOUTH AT BEDTIME (Patient taking differently:  every other day.) 90 tablet 1   No current facility-administered medications for this visit.   Allergies  Allergen Reactions   Lisinopril Shortness Of Breath    Makes lips swell   Allopurinol Nausea Only    dizziness   Apple Swelling    Lip Swelling   Entresto [Sacubitril-Valsartan]     History of angioedema   Other Swelling    Nuts - Lip Swelling   Shellfish Allergy Swelling    Lip Swelling     Review of Systems: All systems reviewed and negative except where noted in HPI.   Lab Results  Component Value Date   WBC 15.7 (H) 10/01/2020   HGB 12.2 (L) 10/01/2020   HCT 36.3 (L) 10/01/2020   MCV 101 (H) 10/01/2020   PLT 298 10/01/2020    Lab Results  Component Value Date   CREATININE 0.90 11/04/2020   BUN 10 11/04/2020   NA 141 11/04/2020   K 3.0 (L) 11/04/2020   CL 104 11/04/2020   CO2 20 11/04/2020    Lab Results  Component Value Date   ALT 13 10/01/2020   AST 21 10/01/2020   ALKPHOS 76 10/01/2020   BILITOT 0.3 10/01/2020     Physical Exam: BP 124/70   Pulse 68   Ht 5\' 5"  (1.651 m)   Wt 162 lb (73.5 kg)   SpO2 96%   BMI 26.96 kg/m  Constitutional: Pleasant,well-developed, male in no acute distress. HEENT: Normocephalic and atraumatic. Conjunctivae are normal. No scleral icterus. Neck supple.  Cardiovascular: Normal rate, regular rhythm.  Pulmonary/chest: Effort normal and breath sounds normal.  Extremities: no edema Neurological: Alert and oriented to person place and time. Skin: Skin is warm and dry. No rashes noted. Psychiatric: Normal mood and affect. Behavior is normal.   ASSESSMENT AND PLAN: 55 year old male here for reassessment of  following:  GERD Epigastric pain Dysphagia Early satiety Colon cancer screening CHF  Patient with significant cardiovascular history as outlined above but appears stable on his current regimen.  He has been having ongoing upper tract symptoms as outlined above, canceled 2 prior procedures at the hospital earlier this year.  He states he is hoping to proceed at this point time with procedures at the hospital.  I think EGD is reasonable to assess his dysphagia, perform dilation if possible, rule out PUD, gastritis, H. pylori, gastric outlet obstruction.  Discussed risks and benefits and he wants to proceed.  Will increase Protonix to twice daily dosing until that time as he states this is helped him so far to help minimize symptoms somewhat.  I will also give him some Zofran to use as needed.  If EGD is negative and symptoms persist, will consider gastric emptying study.  He is agreeable with this plan.  Otherwise we will perform colonoscopy at the same time for screening.  Plan: - schedule EGD and colonoscopy at hospital - increase protonix to BID dosing in the interim - start Zofran 4mg  ODT every 8 hours PRN  Call in the interim with any questions or worsening cardiopulmonary status in the interim  Jolly Mango, MD [facility]

## 2020-12-25 ENCOUNTER — Ambulatory Visit (INDEPENDENT_AMBULATORY_CARE_PROVIDER_SITE_OTHER): Payer: Medicaid Other

## 2020-12-25 DIAGNOSIS — I428 Other cardiomyopathies: Secondary | ICD-10-CM

## 2020-12-25 LAB — CUP PACEART REMOTE DEVICE CHECK
Battery Remaining Longevity: 96 mo
Battery Remaining Percentage: 84 %
Brady Statistic RV Percent Paced: 0 %
Date Time Interrogation Session: 20221221045500
HighPow Impedance: 49 Ohm
Implantable Lead Implant Date: 20150601
Implantable Lead Location: 753860
Implantable Lead Model: 295
Implantable Lead Serial Number: 134196
Implantable Pulse Generator Implant Date: 20150601
Lead Channel Impedance Value: 393 Ohm
Lead Channel Pacing Threshold Amplitude: 1.4 V
Lead Channel Pacing Threshold Pulse Width: 0.6 ms
Lead Channel Setting Pacing Amplitude: 3 V
Lead Channel Setting Pacing Pulse Width: 0.6 ms
Lead Channel Setting Sensing Sensitivity: 0.6 mV
Pulse Gen Serial Number: 190689

## 2021-01-01 ENCOUNTER — Encounter: Payer: Self-pay | Admitting: Nurse Practitioner

## 2021-01-01 ENCOUNTER — Ambulatory Visit: Payer: Medicaid Other | Attending: Nurse Practitioner | Admitting: Nurse Practitioner

## 2021-01-01 ENCOUNTER — Other Ambulatory Visit: Payer: Self-pay

## 2021-01-01 ENCOUNTER — Other Ambulatory Visit: Payer: Self-pay | Admitting: Nurse Practitioner

## 2021-01-01 VITALS — BP 106/65 | HR 88 | Ht 65.0 in | Wt 159.4 lb

## 2021-01-01 DIAGNOSIS — L811 Chloasma: Secondary | ICD-10-CM

## 2021-01-01 DIAGNOSIS — K219 Gastro-esophageal reflux disease without esophagitis: Secondary | ICD-10-CM

## 2021-01-01 DIAGNOSIS — E876 Hypokalemia: Secondary | ICD-10-CM

## 2021-01-01 DIAGNOSIS — I10 Essential (primary) hypertension: Secondary | ICD-10-CM | POA: Diagnosis not present

## 2021-01-01 DIAGNOSIS — D649 Anemia, unspecified: Secondary | ICD-10-CM

## 2021-01-01 DIAGNOSIS — F32A Depression, unspecified: Secondary | ICD-10-CM

## 2021-01-01 DIAGNOSIS — D72829 Elevated white blood cell count, unspecified: Secondary | ICD-10-CM

## 2021-01-01 DIAGNOSIS — F419 Anxiety disorder, unspecified: Secondary | ICD-10-CM | POA: Diagnosis not present

## 2021-01-01 DIAGNOSIS — R7989 Other specified abnormal findings of blood chemistry: Secondary | ICD-10-CM

## 2021-01-01 MED ORDER — PANTOPRAZOLE SODIUM 40 MG PO TBEC: 40.0000 mg | DELAYED_RELEASE_TABLET | Freq: Two times a day (BID) | ORAL | 0 refills | Status: DC

## 2021-01-01 MED ORDER — PAROXETINE HCL 20 MG PO TABS
20.0000 mg | ORAL_TABLET | Freq: Every day | ORAL | 1 refills | Status: DC
Start: 2021-01-01 — End: 2021-02-27

## 2021-01-01 MED ORDER — HYDROQUINONE 4 % EX CREA: TOPICAL_CREAM | Freq: Two times a day (BID) | CUTANEOUS | 1 refills | Status: DC

## 2021-01-01 NOTE — Progress Notes (Signed)
Assessment & Plan:  Stanley [name] was seen today for hypertension.  Diagnoses and all orders for this visit:  Essential hypertension Continue all antihypertensives as prescribed.  Remember to bring in your blood pressure log with you for your follow up appointment.  DASH/Mediterranean Diets are healthier choices for HTN.    Anxiety and depression -     PARoxetine (PAXIL) 20 MG tablet; Take 1 tablet (20 mg total) by mouth daily.  Gastroesophageal reflux disease without esophagitis -     pantoprazole (PROTONIX) 40 MG tablet; Take 1 tablet (40 mg total) by mouth 2 (two) times daily.  Melasma -     hydroquinone 4 % cream; Apply topically 2 (two) times daily.  Anemia, unspecified type -     Iron, TIBC and Ferritin Panel -     CBC with Differential  Leukocytosis, unspecified type -     CBC with Differential  Low TSH level -     Thyroid Panel With TSH  Hypokalemia -     CMP14+EGFR    Patient has been counseled on age-appropriate routine health concerns for screening and prevention. These are reviewed and up-to-date. Referrals have been placed accordingly. Immunizations are up-to-date or declined.    Subjective:   Chief Complaint  Patient presents with   Hypertension   Hypertension Pertinent negatives include no blurred vision, chest pain, headaches, malaise/fatigue, palpitations or shortness of breath.  Stanley [name] 55 y.o. male presents to office today for follow up to HTN. He has a past medical history of Acute renal failure (ARF) ([place]), Acute respiratory failure with hypoxia ([place]) (12/29/2018), Alcohol abuse, Anxiety, CAD (coronary artery disease), Chronic chest pain, Chronic systolic CHF (congestive heart failure) ([place]), Depression, Dyspnea, Financial difficulties, Gout, Gouty arthritis, Headache, History of noncompliance with medical treatment, Hypertension, ICD (implantable cardioverter-defibrillator) in place, Insomnia, Lobar pneumonia ([place]) (12/28/2018), Myocardial  infarction ([place]) (2005), Nonischemic cardiomyopathy ([place]), Sleep apnea, and Ventricular tachycardia (01/2015).    HTN  Blood pressure is well controlled. He is taking amiodarone 200 mg daily, carvedilol 25 mg BID, digoxin 0.0625 mg daily, bidil 20-37.5 mg BID, losartan 100 mg daily, spironolactone 25 mg daily.  BP Readings from Last 3 Encounters:  01/01/21 106/65  12/12/20 124/70  10/28/20 130/68    Anxiety and Depression with Insomnia Taking trazodone 150 mg daily as prescribed. Endorses significant relief of insomnia with taking. Would like to start medication for depression. He was previously taking lexapro but stopped taking. Due to potential EKG changes (prolonged QT interval) I will send paxil today instead.    GERD  He has not been taking protonix as prescribed. States he never picked it up when the dosage was changed to 40 mg BID.   Review of Systems  Constitutional:  Negative for fever, malaise/fatigue and weight loss.  HENT: Negative.  Negative for nosebleeds.   Eyes: Negative.  Negative for blurred vision, double vision and photophobia.  Respiratory: Negative.  Negative for cough and shortness of breath.   Cardiovascular: Negative.  Negative for chest pain, palpitations and leg swelling.  Gastrointestinal:  Positive for heartburn. Negative for nausea and vomiting.  Musculoskeletal: Negative.  Negative for myalgias.  Neurological: Negative.  Negative for dizziness, focal weakness, seizures and headaches.  Psychiatric/Behavioral:  Positive for depression. Negative for suicidal ideas. The patient is nervous/anxious and has insomnia.    Past Medical History:  Diagnosis Date   Acute renal failure (ARF) ([place])    Acute respiratory failure with hypoxia ([place]) 12/29/2018   Alcohol abuse  Anxiety    CAD (coronary artery disease)    a. dx unclear, reported PCI in 2011 but cath 2014 normal coronaries and cardiac CT 07/2016 normal coronaries, no mention of stents.   Chronic chest  pain    Chronic systolic CHF (congestive heart failure) ([place])    Depression    Dyspnea    07/05/2019: per patient some times with exertion, "has to catch breath"   Financial difficulties    Gout    Gouty arthritis    Headache    History of noncompliance with medical treatment    financial challenges   Hypertension    ICD (implantable cardioverter-defibrillator) in place    [place] Sci ICD implant done in [place] 2015   Insomnia    Lobar pneumonia ([place]) 12/28/2018   Myocardial infarction ([place]) 2005   Nonischemic cardiomyopathy ([place])    Sleep apnea    per patient, has CPAP does not wear it every night   Ventricular tachycardia 01/2015   2017 - ATP delivered for sinus tach, degenerated into VT for which he received shock. Recurrent VT 03/2018.    Past Surgical History:  Procedure Laterality Date   BAROREFLEX SYSTEM INSERTION     CARDIAC DEFIBRILLATOR PLACEMENT  06/05/2013   [employer] Inogen ICD implanted at [facility] in [place] for primary prevention   [place] (PCI-S)  01/05/2009    Family History  Problem Relation Age of Onset   Breast cancer Mother    Pancreatic cancer Mother    Cancer Mother        Pancreatic   Hypertension Father    Alzheimer's disease Father    Gout Father    Dementia Father    Hypertension Sister    Clotting disorder Sister    Obesity Brother    Bronchitis Brother    Heart disease Maternal Grandmother    Gout Paternal Grandfather    Colon polyps Neg Hx    Colon cancer Neg Hx    Esophageal cancer Neg Hx    Stomach cancer Neg Hx     Social History Reviewed with no changes to be made today.   Outpatient Medications Prior to Visit  Medication Sig Dispense Refill   allopurinol (ZYLOPRIM) 100 MG tablet Take 1 tablet (100 mg total) by mouth daily. FOR GOUT 30 tablet 6   amiodarone (PACERONE) 200 MG tablet TAKE 1 TABLET(200 MG) BY MOUTH DAILY 90 tablet 3   atorvastatin (LIPITOR) 20  MG tablet Take 1 tablet (20 mg total) by mouth daily. 90 tablet 3   carvedilol (COREG) 25 MG tablet TAKE 1 TABLET(25 MG) BY MOUTH TWICE DAILY WITH A MEAL 180 tablet 3   digoxin (LANOXIN) 0.125 MG tablet Take 0.5 tablets (0.0625 mg total) by mouth daily. 45 tablet 3   furosemide (LASIX) 80 MG tablet TAKE 1 TABLET BY MOUTH ON MONDAY, WEDNESDAY& FRIDAY. MAY TAKE 1 TABLET AS NEEDED FOR SWELLING 30 tablet 6   isosorbide-hydrALAZINE (BIDIL) 20-37.5 MG tablet TAKE 1 TABLET BY MOUTH THREE TIMES DAILY 90 tablet 0   losartan (COZAAR) 100 MG tablet Take 1 tablet (100 mg total) by mouth daily. (Patient taking differently: Take 100 mg by mouth 2 (two) times daily.) 30 tablet 6   nitroGLYCERIN (NITROSTAT) 0.4 MG SL tablet Place 1 tablet (0.4 mg total) under the tongue every 5 (five) minutes x 3 doses as needed for chest pain. 30 tablet 6   potassium chloride SA (KLOR-CON) 20  MEQ tablet Take 3 tablets (60 mEq total) by mouth daily. 180 tablet 3   spironolactone (ALDACTONE) 25 MG tablet Take 1 tablet (25 mg total) by mouth daily. (Patient taking differently: Take 25 mg by mouth as needed.) 90 tablet 3   traZODone (DESYREL) 150 MG tablet TAKE 1 TABLET(150 MG) BY MOUTH AT BEDTIME (Patient taking differently: every other day.) 90 tablet 1   hydroquinone 4 % cream Apply topically 2 (two) times daily. (Patient taking differently: Apply topically as needed. Skin discoloration) 50 g 1   ondansetron (ZOFRAN-ODT) 4 MG disintegrating tablet Take 1 tablet (4 mg total) by mouth every 8 (eight) hours as needed for nausea or vomiting. (Patient not taking: Reported on 01/01/2021) 30 tablet 1   sodium chloride (OCEAN) 0.65 % SOLN nasal spray Place 1 spray into both nostrils daily as needed for congestion. (Patient not taking: Reported on 01/01/2021)     pantoprazole (PROTONIX) 40 MG tablet Take 1 tablet (40 mg total) by mouth 2 (two) times daily. (Patient not taking: Reported on 01/01/2021) 60 tablet 3   No facility-administered  medications prior to visit.    Allergies  Allergen Reactions   Lisinopril Shortness Of Breath    Makes lips swell   Allopurinol Nausea Only    dizziness   Apple Swelling    Lip Swelling   Entresto [Sacubitril-Valsartan]     History of angioedema   Other Swelling    Nuts - Lip Swelling   Shellfish Allergy Swelling    Lip Swelling       Objective:    BP 106/65    Pulse 88    Ht $R'5\' 5"'qO$  (1.651 m)    Wt 159 lb 6.4 oz (72.3 kg)    SpO2 97%    BMI 26.53 kg/m  Wt Readings from Last 3 Encounters:  01/01/21 159 lb 6.4 oz (72.3 kg)  12/12/20 162 lb (73.5 kg)  10/28/20 161 lb (73 kg)    Physical Exam       Patient has been counseled extensively about nutrition and exercise as well as the importance of adherence with medications and regular follow-up. The patient was given clear instructions to go to ER or return to medical center if symptoms don't improve, worsen or new problems develop. The patient verbalized understanding.   Follow-up: Return in about 3 months (around 04/01/2021).   Gildardo Pounds, FNP-BC [facility] [place] [place], [place]   01/01/2021, 3:26 PM

## 2021-01-01 NOTE — Patient Instructions (Addendum)
Placed in [facility]  [address] [place] [address] Ph# [phone]   Fax [phone]

## 2021-01-02 LAB — CBC WITH DIFFERENTIAL/PLATELET
Basophils Absolute: 0.1 10*3/uL (ref 0.0–0.2)
Basos: 1 %
EOS (ABSOLUTE): 0.2 10*3/uL (ref 0.0–0.4)
Eos: 1 %
Hematocrit: 36.6 % — ABNORMAL LOW (ref 37.5–51.0)
Hemoglobin: 12.5 g/dL — ABNORMAL LOW (ref 13.0–17.7)
Immature Grans (Abs): 0.1 10*3/uL (ref 0.0–0.1)
Immature Granulocytes: 1 %
Lymphocytes Absolute: 2.3 10*3/uL (ref 0.7–3.1)
Lymphs: 15 %
MCH: 34.1 pg — ABNORMAL HIGH (ref 26.6–33.0)
MCHC: 34.2 g/dL (ref 31.5–35.7)
MCV: 100 fL — ABNORMAL HIGH (ref 79–97)
Monocytes Absolute: 0.9 10*3/uL (ref 0.1–0.9)
Monocytes: 6 %
Neutrophils Absolute: 11.6 10*3/uL — ABNORMAL HIGH (ref 1.4–7.0)
Neutrophils: 76 %
Platelets: 331 10*3/uL (ref 150–450)
RBC: 3.67 x10E6/uL — ABNORMAL LOW (ref 4.14–5.80)
RDW: 12.1 % (ref 11.6–15.4)
WBC: 15.1 10*3/uL — ABNORMAL HIGH (ref 3.4–10.8)

## 2021-01-02 LAB — IRON,TIBC AND FERRITIN PANEL
Ferritin: 1132 ng/mL — ABNORMAL HIGH (ref 30–400)
Iron Saturation: 28 % (ref 15–55)
Iron: 70 ug/dL (ref 38–169)
Total Iron Binding Capacity: 252 ug/dL (ref 250–450)
UIBC: 182 ug/dL (ref 111–343)

## 2021-01-02 LAB — CMP14+EGFR
ALT: 12 IU/L (ref 0–44)
AST: 25 IU/L (ref 0–40)
Albumin/Globulin Ratio: 1.2 (ref 1.2–2.2)
Albumin: 4.3 g/dL (ref 3.8–4.9)
Alkaline Phosphatase: 77 IU/L (ref 44–121)
BUN/Creatinine Ratio: 14 (ref 9–20)
BUN: 16 mg/dL (ref 6–24)
Bilirubin Total: 0.6 mg/dL (ref 0.0–1.2)
CO2: 21 mmol/L (ref 20–29)
Calcium: 9.5 mg/dL (ref 8.7–10.2)
Chloride: 101 mmol/L (ref 96–106)
Creatinine, Ser: 1.16 mg/dL (ref 0.76–1.27)
Globulin, Total: 3.6 g/dL (ref 1.5–4.5)
Glucose: 87 mg/dL (ref 70–99)
Potassium: 3.2 mmol/L — ABNORMAL LOW (ref 3.5–5.2)
Sodium: 140 mmol/L (ref 134–144)
Total Protein: 7.9 g/dL (ref 6.0–8.5)
eGFR: 73 mL/min/{1.73_m2} (ref >59)

## 2021-01-02 LAB — THYROID PANEL WITH TSH
Free Thyroxine Index: 2.3 (ref 1.2–4.9)
T3 Uptake Ratio: 30 % (ref 24–39)
T4, Total: 7.5 ug/dL (ref 4.5–12.0)
TSH: 0.73 u[IU]/mL (ref 0.450–4.500)

## 2021-01-03 NOTE — Progress Notes (Signed)
Remote ICD transmission.   

## 2021-01-06 NOTE — Progress Notes (Signed)
-- Patient did not show for appt. Note left for templating purposes only --   ADVANCED HF CLINIC NOTE  Referring Physician: Dr. Rayann Heman Primary Cardiologist: Dr. Rayann Heman   HPI:  Stanley [name] is a 55 y.o. male with systolic HF due to NICM d/x'd in 2006 (felt 2/2 HTN/ETOH), ? CAD s/p PCI to unknown vessel 2011 at outside location (cath 2014 without CAD and normal coronaries by CT 07/2016), VT s/p [facility] ICD implant ([place] 2015), noncompliance, chronic noncardiac chest pain, depression, HTN and ETOH abuse.    In 2017 he received inappropriate therapy for ST with ATP (after a work out at [employer] and reported noncompliance with meds) that degenerated into VT and received several shocks. He has had issues with chronic chest pain without evidence for ischemic etiology including normal cath and cardiac CT as above. This is relatively unchanged. Echo 03/2016  EF 35-40%, grade 1 DD, mild MR, moderate LAE  Had recurrent ICD shock in 3/20 in setting of medicine noncompliance. Amio continued. Echo 3/20 EF 25-30% Normal RV  We have not seen him since 3/21. Has been following with EP and has Barostim device in place.   Echo 3/21: EF 25-30% RV ok Myoview 5/22: EF 34% fixed inferior defect  Here for f/u  CPX 09/19/18   FVC 1.96 (57%)      FEV1 1.60 (58%)        FEV1/FVC 81 (101%)        MVV 80 (61%)        Resting HR: 77 Peak HR: 131   (79% age predicted max HR)  BP rest: 168/80          Standing BP: 156/74 BP peak: 196/80   Peak VO2: 14.9 (52% predicted peak VO2)  VE/VCO2 slope:  31  OUES: 1.43  Peak RER: 1.05  VE/MVV:  50% O2pulse:  10   (67% predicted O2pulse)   Moderate to severe HF limitation but normal VeVCO2 slope is reassuring. Restrictive lung physiology.   Past Medical History:  Diagnosis Date   Acute renal failure (ARF) ([place])    Acute respiratory failure with hypoxia ([place]) 12/29/2018   Alcohol abuse    Anxiety    CAD (coronary artery disease)    a. dx unclear,  reported PCI in 2011 but cath 2014 normal coronaries and cardiac CT 07/2016 normal coronaries, no mention of stents.   Chronic chest pain    Chronic systolic CHF (congestive heart failure) ([place])    Depression    Dyspnea    07/05/2019: per patient some times with exertion, "has to catch breath"   Financial difficulties    Gout    Gouty arthritis    Headache    History of noncompliance with medical treatment    financial challenges   Hypertension    ICD (implantable cardioverter-defibrillator) in place    [place] [employer] ICD implant done in [place] 2015   Insomnia    Lobar pneumonia ([place]) 12/28/2018   Myocardial infarction ([place]) 2005   Nonischemic cardiomyopathy ([place])    Sleep apnea    per patient, has CPAP does not wear it every night   Ventricular tachycardia 01/2015   2017 - ATP delivered for sinus tach, degenerated into VT for which he received shock. Recurrent VT 03/2018.    Current Outpatient Medications  Medication Sig Dispense Refill   allopurinol (ZYLOPRIM) 100 MG tablet Take 1 tablet (100 mg total) by mouth daily. FOR GOUT 30 tablet 6   amiodarone (  PACERONE) 200 MG tablet TAKE 1 TABLET(200 MG) BY MOUTH DAILY 90 tablet 3   atorvastatin (LIPITOR) 20 MG tablet Take 1 tablet (20 mg total) by mouth daily. 90 tablet 3   carvedilol (COREG) 25 MG tablet TAKE 1 TABLET(25 MG) BY MOUTH TWICE DAILY WITH A MEAL 180 tablet 3   digoxin (LANOXIN) 0.125 MG tablet Take 0.5 tablets (0.0625 mg total) by mouth daily. 45 tablet 3   furosemide (LASIX) 80 MG tablet TAKE 1 TABLET BY MOUTH ON MONDAY, WEDNESDAY& FRIDAY. MAY TAKE 1 TABLET AS NEEDED FOR SWELLING 30 tablet 6   hydroquinone 4 % cream Apply topically 2 (two) times daily. 50 g 1   isosorbide-hydrALAZINE (BIDIL) 20-37.5 MG tablet TAKE 1 TABLET BY MOUTH THREE TIMES DAILY 90 tablet 0   losartan (COZAAR) 100 MG tablet Take 1 tablet (100 mg total) by mouth daily. (Patient taking differently: Take 100 mg by mouth 2 (two) times daily.) 30 tablet 6    nitroGLYCERIN (NITROSTAT) 0.4 MG SL tablet Place 1 tablet (0.4 mg total) under the tongue every 5 (five) minutes x 3 doses as needed for chest pain. 30 tablet 6   ondansetron (ZOFRAN-ODT) 4 MG disintegrating tablet Take 1 tablet (4 mg total) by mouth every 8 (eight) hours as needed for nausea or vomiting. (Patient not taking: Reported on 01/01/2021) 30 tablet 1   pantoprazole (PROTONIX) 40 MG tablet Take 1 tablet (40 mg total) by mouth 2 (two) times daily. 60 tablet 0   PARoxetine (PAXIL) 20 MG tablet Take 1 tablet (20 mg total) by mouth daily. 30 tablet 1   potassium chloride SA (KLOR-CON) 20 MEQ tablet Take 3 tablets (60 mEq total) by mouth daily. 180 tablet 3   sodium chloride (OCEAN) 0.65 % SOLN nasal spray Place 1 spray into both nostrils daily as needed for congestion. (Patient not taking: Reported on 01/01/2021)     spironolactone (ALDACTONE) 25 MG tablet Take 1 tablet (25 mg total) by mouth daily. (Patient taking differently: Take 25 mg by mouth as needed.) 90 tablet 3   traZODone (DESYREL) 150 MG tablet TAKE 1 TABLET(150 MG) BY MOUTH AT BEDTIME (Patient taking differently: every other day.) 90 tablet 1   No current facility-administered medications for this encounter.    Allergies  Allergen Reactions   Lisinopril Shortness Of Breath    Makes lips swell   Allopurinol Nausea Only    dizziness   Apple Swelling    Lip Swelling   Entresto [Sacubitril-Valsartan]     History of angioedema   Other Swelling    Nuts - Lip Swelling   Shellfish Allergy Swelling    Lip Swelling      Social History   Socioeconomic History   Marital status: Divorced    Spouse name: Not on file   Number of children: 1   Years of education: 12   Highest education level: Not on file  Occupational History   Occupation: Disability  Tobacco Use   Smoking status: Never   Smokeless tobacco: Never  Vaping Use   Vaping Use: Never used  Substance and Sexual Activity   Alcohol use: Yes    Alcohol/week:  0.0 standard drinks    Comment: rarely   Drug use: No   Sexual activity: Not Currently  Other Topics Concern   Not on file  Social History Narrative   Lives in [place] alone.  Disabled.  Previously worked as a Careers adviser.   Fun: Rest, Read and watch movies.  Social Determinants of Health   Financial Resource Strain: Not on file  Food Insecurity: Not on file  Transportation Needs: Not on file  Physical Activity: Not on file  Stress: Not on file  Social Connections: Not on file  Intimate Partner Violence: Not on file      Family History  Problem Relation Age of Onset   Breast cancer Mother    Pancreatic cancer Mother    Cancer Mother        Pancreatic   Hypertension Father    Alzheimer's disease Father    Gout Father    Dementia Father    Hypertension Sister    Clotting disorder Sister    Obesity Brother    Bronchitis Brother    Heart disease Maternal Grandmother    Gout Paternal Grandfather    Colon polyps Neg Hx    Colon cancer Neg Hx    Esophageal cancer Neg Hx    Stomach cancer Neg Hx     There were no vitals filed for this visit.   PHYSICAL EXAM: General:  Well appearing. No resp difficulty HEENT: normal Neck: supple. no JVD. Carotids 2+ bilat; no bruits. No lymphadenopathy or thryomegaly appreciated. Cor: PMI nondisplaced. Regular rate & rhythm. No rubs, gallops or murmurs. Lungs: clear Abdomen: soft, nontender, nondistended. No hepatosplenomegaly. No bruits or masses. Good bowel sounds. Extremities: no cyanosis, clubbing, rash, edema Neuro: alert & orientedx3, cranial nerves grossly intact. moves all 4 extremities w/o difficulty. Affect pleasant   ECG 03/15/19: NSR 89 IVCD 136ms LVH with non-specific ST abnormalities Personally reviewed    ASSESSMENT & PLAN:  1. Chronic systolic heart failure - Mostly NICM (? H/o PCI to unknown vessel in 2011.) CTA 2018 normal cors - s/p [employer] ICD - EF 25-30% echo 3/20 - Echo 03/10/19 EF 25-30% RV  ok Mild MR - Myoview 5/22: EF 34% fixed inferior defect - Stable NYHA III - CPX 9/20 moderate to severe HF limitation. pVO2: 14.9 (52% predicted peak VO2).  slope: 31  pRER: 1.05  - Volume status ok on lasix MWF - Continue carvedilol 25 bid - Increase losartan to 100 daily  (No ARNI with angioedema with ACE-I) - Continue digoxin 0.125 daily - Continue spiro 12.5 - Continue Bidil 1 tid - Consider Farxiga at next visit  - Continue to follow closely for need for advanced therapies. For [employer] implant on 03/29/19 - ICD interrogated personally. No further VT. Activity 4 hrs per day.   2. VT - quiescent on amio - followed by Dr. Rayann Heman - No VT on ICD interrogation today  3. HTN - BP ok. Increasing losartan as above  4. OSA - using CPAP  5. CKD 3 - creatinine 1.3-1.6 - recheck today  6. CAD  - h/o single vessel PCI by report - Coronary CT  7/18 No CAD. CAC = 0  - Myoview 5/22: EF 34% fixed inferior defect - continue ASA/statin  7. Depession - h/o suicidal ideation - brother killed in 2014 - Much improved  8. ETOH - likely related to depression - reports he has cut back dramatically. Congratulated him .   Glori Bickers, MD  5:31 PM

## 2021-01-07 ENCOUNTER — Inpatient Hospital Stay (HOSPITAL_COMMUNITY)
Admission: RE | Admit: 2021-01-07 | Discharge: 2021-01-07 | Disposition: A | Discharge status: Home / Self Care | Payer: Medicaid Other | Source: Ambulatory Visit | Attending: Internal Medicine | Admitting: Internal Medicine

## 2021-01-07 DIAGNOSIS — I5022 Chronic systolic (congestive) heart failure: Secondary | ICD-10-CM

## 2021-01-15 ENCOUNTER — Encounter: Payer: Medicaid Other | Admitting: Gastroenterology

## 2021-02-12 ENCOUNTER — Other Ambulatory Visit: Payer: Self-pay

## 2021-02-12 DIAGNOSIS — I472 Ventricular tachycardia, unspecified: Secondary | ICD-10-CM

## 2021-02-12 MED ORDER — FUROSEMIDE 80 MG PO TABS: ORAL_TABLET | ORAL | 2 refills | Status: DC

## 2021-02-12 MED ORDER — DIGOXIN 125 MCG PO TABS: 0.0625 mg | ORAL_TABLET | Freq: Every day | ORAL | 2 refills | Status: DC

## 2021-02-12 NOTE — Addendum Note (Signed)
Addended by: Carter Kitten D on: 02/12/2021 10:04 AM   Modules accepted: Orders

## 2021-02-17 ENCOUNTER — Other Ambulatory Visit: Payer: Self-pay | Admitting: Nurse Practitioner

## 2021-02-17 DIAGNOSIS — F5105 Insomnia due to other mental disorder: Secondary | ICD-10-CM

## 2021-02-17 DIAGNOSIS — F418 Other specified anxiety disorders: Secondary | ICD-10-CM

## 2021-02-17 NOTE — Telephone Encounter (Signed)
Medication Refill - Medication: traZODone (DESYREL) 150 MG tablet  Has the patient contacted their pharmacy? NoNira [name], from [facility] calling requesting refill.   (Agent: If no, request that the patient contact the pharmacy for the refill. If patient does not wish to contact the pharmacy document the reason why and proceed with request.)   Preferred Pharmacy (with phone number or street name):  [facility], [address]  [place] [address]  Phone: [phone] Fax: [phone]  Hours: Not open 24 hours   Has the patient been seen for an appointment in the last year OR does the patient have an upcoming appointment? Yes.    Agent: Please be advised that RX refills may take up to 3 business days. We ask that you follow-up with your pharmacy.

## 2021-02-18 MED ORDER — TRAZODONE HCL 150 MG PO TABS: ORAL_TABLET | ORAL | 1 refills | Status: DC

## 2021-02-18 NOTE — Telephone Encounter (Signed)
Requested Prescriptions  Pending Prescriptions Disp Refills   traZODone (DESYREL) 150 MG tablet 90 tablet 1    Sig: TAKE 1 TABLET(150 MG) BY MOUTH AT BEDTIME Strength: 150 mg     Psychiatry: Antidepressants - Serotonin Modulator Passed - 02/17/2021  2:54 PM      Passed - Completed PHQ-2 or PHQ-9 in the last 360 days      Passed - Valid encounter within last 6 months    Recent Outpatient Visits          1 month ago Essential hypertension   [place], Vernia Buff, NP   4 months ago Essential hypertension   [place], [place] W, NP   11 months ago Insomnia secondary to depression with anxiety   [place], Vernia Buff, NP   1 year ago Essential hypertension   [place], NP   2 years ago Nonischemic cardiomyopathy [facility])   [place] Elsie Stain, MD      Future Appointments            In 1 month Gildardo Pounds, NP [place]

## 2021-02-18 NOTE — Telephone Encounter (Signed)
Attempted to refill medication per protocol- original Rx marked as print- will need to be changed at office

## 2021-02-24 ENCOUNTER — Other Ambulatory Visit: Payer: Self-pay | Admitting: Family Medicine

## 2021-02-24 DIAGNOSIS — F5105 Insomnia due to other mental disorder: Secondary | ICD-10-CM

## 2021-02-24 DIAGNOSIS — F418 Other specified anxiety disorders: Secondary | ICD-10-CM

## 2021-02-26 ENCOUNTER — Encounter (HOSPITAL_COMMUNITY): Payer: Self-pay | Admitting: Gastroenterology

## 2021-02-26 NOTE — Progress Notes (Signed)
Attempted to obtain medical history via telephone, unable to reach at this time. I left a voicemail to return pre surgical testing department's phone call.  

## 2021-02-27 ENCOUNTER — Telehealth: Payer: Self-pay | Admitting: Gastroenterology

## 2021-02-27 NOTE — Telephone Encounter (Signed)
Called and spoke to patient. He did not know that they had been trying to reach him since he didn't recognize that number.  He will call them to coordinate getting his Sutab.

## 2021-02-27 NOTE — Telephone Encounter (Signed)
Called and left detailed message for [facility] that patient needs SUTAB for procedure next week and needs to be contacted immediately or we will need to send to local pharmacy. Please advise

## 2021-02-27 NOTE — Telephone Encounter (Signed)
Inbound call from patient states he never received hisSutab prescription for upcoming procedure 3/2. Requesting it be sent to [facility] on [address]

## 2021-02-27 NOTE — Telephone Encounter (Signed)
[facility] called back. They have tried to reach the patient 5 times but he has not called them back. They will try again and let me know if they can't reach him and can't get him his prep in time.

## 2021-02-28 ENCOUNTER — Telehealth: Payer: Self-pay | Admitting: Gastroenterology

## 2021-02-28 ENCOUNTER — Other Ambulatory Visit: Payer: Self-pay | Admitting: *Deleted

## 2021-02-28 ENCOUNTER — Other Ambulatory Visit: Payer: Self-pay | Admitting: Nurse Practitioner

## 2021-02-28 DIAGNOSIS — I10 Essential (primary) hypertension: Secondary | ICD-10-CM

## 2021-02-28 MED ORDER — AMIODARONE HCL 200 MG PO TABS: ORAL_TABLET | ORAL | 3 refills | Status: DC

## 2021-02-28 NOTE — Telephone Encounter (Signed)
Carrie from Pre-Surgery at [facility] [phone]) called regarding patient's upcoming procedure.  Patient could not get the Sutabs from his pharmacy and just wants to use the regular liquid prep.  Because of this, he will need new instructions, which can be posted on his My Chart.  Procedure date is 03/06/21 at [facility].  Thank you.

## 2021-02-28 NOTE — Progress Notes (Signed)
Incoming call from patient today  for colonoscopy on 3/2, apparently patient has had issues getting prep for procedure. He states he went to pick up and pharmacy said nothing was there. He is getting uneasy about making sure to get his prep in time so he would prefer at this point to do the powder mix instead of sutab. I called Dr Ozella Rocks office and let them know to send out the powder mix to his pharmacy and send updated instructions. I told the patient to give the office time to send the prescription so to pick up on Monday and if not there to call the office. Patient understands.

## 2021-02-28 NOTE — Telephone Encounter (Signed)
Requested Prescriptions  Pending Prescriptions Disp Refills   carvedilol (COREG) 25 MG tablet [Pharmacy Med Name: CARVEDILOL 25MG  TABLETS] 180 tablet 3    Sig: TAKE 1 TABLET(25 MG) BY MOUTH TWICE DAILY WITH A MEAL     Cardiovascular: Beta Blockers 3 Passed - 02/28/2021  6:14 AM      Passed - Cr in normal range and within 360 days    Creatinine  Date Value Ref Range Status  03/07/2020 1.54 (H) 0.61 - 1.24 mg/dL Final   Creatinine, Ser  Date Value Ref Range Status  01/01/2021 1.16 0.76 - 1.27 mg/dL Final         Passed - AST in normal range and within 360 days    AST  Date Value Ref Range Status  01/01/2021 25 0 - 40 IU/L Final  03/07/2020 25 15 - 41 U/L Final         Passed - ALT in normal range and within 360 days    ALT  Date Value Ref Range Status  01/01/2021 12 0 - 44 IU/L Final  03/07/2020 19 0 - 44 U/L Final         Passed - Last BP in normal range    BP Readings from Last 1 Encounters:  01/01/21 106/65         Passed - Last Heart Rate in normal range    Pulse Readings from Last 1 Encounters:  01/01/21 88         Passed - Valid encounter within last 6 months    Recent Outpatient Visits          1 month ago Essential hypertension   [place], Vernia Buff, NP   5 months ago Essential hypertension   [place], [place] W, NP   11 months ago Insomnia secondary to depression with anxiety   [place], Vernia Buff, NP   1 year ago Essential hypertension   [place], NP   2 years ago Nonischemic cardiomyopathy [facility])   [place], MD      Future Appointments            In 1 month Gildardo Pounds, NP [place]

## 2021-03-02 NOTE — Telephone Encounter (Signed)
Contacted patient on Thursday and spoke with [facility] on Thursday regarding patient getting his Sutab for procedure next week. [facility] indicated they had tried to reach patient multiple times but he did not respond, patient was not aware the number calling him was [facility]. Sent MyChart message to patient asking him to confirm he connected with [facility] and will have his Sutab in time for prepping on Wednesday.

## 2021-03-03 ENCOUNTER — Telehealth: Payer: Self-pay | Admitting: Hematology and Oncology

## 2021-03-03 ENCOUNTER — Telehealth: Payer: Self-pay | Admitting: Gastroenterology

## 2021-03-03 NOTE — Telephone Encounter (Signed)
Patient called and stated that he does not have 50 dollars for the Sutab for his procedure on 3/2 at [facility].  Patient is seeking advice as to what he needs to do. Please advise.

## 2021-03-03 NOTE — Telephone Encounter (Signed)
Inbound call from patient states he did not contact [facility] and would like prescription sent to [facility] on [address]

## 2021-03-03 NOTE — Telephone Encounter (Signed)
Rescheduled appointment per provider template. Left message. ?

## 2021-03-03 NOTE — Telephone Encounter (Signed)
Spoke to patient.  I am leaving a Sutab sample at the front desk of the 3rd floor for patient to pick up today for procedure on Thursday.

## 2021-03-05 ENCOUNTER — Encounter (HOSPITAL_COMMUNITY): Payer: Self-pay | Admitting: Gastroenterology

## 2021-03-06 ENCOUNTER — Other Ambulatory Visit: Payer: Self-pay

## 2021-03-06 ENCOUNTER — Ambulatory Visit (HOSPITAL_BASED_OUTPATIENT_CLINIC_OR_DEPARTMENT_OTHER): Payer: Medicaid Other | Admitting: Anesthesiology

## 2021-03-06 ENCOUNTER — Ambulatory Visit (HOSPITAL_COMMUNITY)
Admission: RE | Admit: 2021-03-06 | Discharge: 2021-03-06 | Disposition: A | Discharge status: Home / Self Care | Payer: Medicaid Other | Source: Ambulatory Visit | Attending: Gastroenterology | Admitting: Gastroenterology

## 2021-03-06 ENCOUNTER — Encounter (HOSPITAL_COMMUNITY)
Admission: RE | Disposition: A | Discharge status: Home / Self Care | Payer: Self-pay | Source: Ambulatory Visit | Attending: Gastroenterology

## 2021-03-06 ENCOUNTER — Encounter (HOSPITAL_COMMUNITY): Payer: Self-pay | Admitting: Gastroenterology

## 2021-03-06 ENCOUNTER — Ambulatory Visit (HOSPITAL_COMMUNITY): Payer: Medicaid Other | Admitting: Anesthesiology

## 2021-03-06 DIAGNOSIS — R1013 Epigastric pain: Secondary | ICD-10-CM

## 2021-03-06 DIAGNOSIS — I509 Heart failure, unspecified: Secondary | ICD-10-CM

## 2021-03-06 DIAGNOSIS — D122 Benign neoplasm of ascending colon: Secondary | ICD-10-CM | POA: Insufficient documentation

## 2021-03-06 DIAGNOSIS — D126 Benign neoplasm of colon, unspecified: Secondary | ICD-10-CM | POA: Diagnosis not present

## 2021-03-06 DIAGNOSIS — Z1211 Encounter for screening for malignant neoplasm of colon: Secondary | ICD-10-CM

## 2021-03-06 DIAGNOSIS — I252 Old myocardial infarction: Secondary | ICD-10-CM | POA: Diagnosis not present

## 2021-03-06 DIAGNOSIS — K5730 Diverticulosis of large intestine without perforation or abscess without bleeding: Secondary | ICD-10-CM | POA: Diagnosis not present

## 2021-03-06 DIAGNOSIS — R1310 Dysphagia, unspecified: Secondary | ICD-10-CM | POA: Diagnosis not present

## 2021-03-06 DIAGNOSIS — I5022 Chronic systolic (congestive) heart failure: Secondary | ICD-10-CM | POA: Insufficient documentation

## 2021-03-06 DIAGNOSIS — K219 Gastro-esophageal reflux disease without esophagitis: Secondary | ICD-10-CM | POA: Diagnosis not present

## 2021-03-06 DIAGNOSIS — K449 Diaphragmatic hernia without obstruction or gangrene: Secondary | ICD-10-CM | POA: Diagnosis not present

## 2021-03-06 DIAGNOSIS — K2100 Gastro-esophageal reflux disease with esophagitis, without bleeding: Secondary | ICD-10-CM | POA: Diagnosis not present

## 2021-03-06 DIAGNOSIS — Z79899 Other long term (current) drug therapy: Secondary | ICD-10-CM | POA: Insufficient documentation

## 2021-03-06 DIAGNOSIS — K635 Polyp of colon: Secondary | ICD-10-CM

## 2021-03-06 DIAGNOSIS — K3189 Other diseases of stomach and duodenum: Secondary | ICD-10-CM | POA: Diagnosis not present

## 2021-03-06 DIAGNOSIS — I110 Hypertensive heart disease with heart failure: Secondary | ICD-10-CM | POA: Insufficient documentation

## 2021-03-06 DIAGNOSIS — K648 Other hemorrhoids: Secondary | ICD-10-CM | POA: Insufficient documentation

## 2021-03-06 DIAGNOSIS — R6881 Early satiety: Secondary | ICD-10-CM

## 2021-03-06 HISTORY — PX: COLONOSCOPY WITH PROPOFOL: SHX5780

## 2021-03-06 HISTORY — PX: BIOPSY: SHX5522

## 2021-03-06 HISTORY — PX: ESOPHAGOGASTRODUODENOSCOPY (EGD) WITH PROPOFOL: SHX5813

## 2021-03-06 HISTORY — PX: POLYPECTOMY: SHX5525

## 2021-03-06 HISTORY — PX: ESOPHAGEAL DILATION: SHX303

## 2021-03-06 SURGERY — COLONOSCOPY WITH PROPOFOL
Anesthesia: Monitor Anesthesia Care

## 2021-03-06 MED ORDER — PROPOFOL 500 MG/50ML IV EMUL
INTRAVENOUS | Status: DC | PRN
  Administered 2021-03-06: 150 ug/kg/min via INTRAVENOUS

## 2021-03-06 MED ORDER — PROPOFOL 500 MG/50ML IV EMUL
INTRAVENOUS | Status: DC | PRN
  Administered 2021-03-06 (×4): 30 mg via INTRAVENOUS

## 2021-03-06 MED ORDER — PROPOFOL 500 MG/50ML IV EMUL
INTRAVENOUS | Status: AC
  Filled 2021-03-06: qty 50

## 2021-03-06 MED ORDER — SODIUM CHLORIDE 0.9 % IV SOLN: INTRAVENOUS | Status: DC

## 2021-03-06 MED ORDER — LACTATED RINGERS IV SOLN: INTRAVENOUS | Status: DC

## 2021-03-06 SURGICAL SUPPLY — 25 items

## 2021-03-06 NOTE — Discharge Instructions (Signed)
YOU HAD AN ENDOSCOPIC PROCEDURE TODAY: Refer to the procedure report and other information in the discharge instructions given to you for any specific questions about what was found during the examination. If this information does not answer your questions, please call [place] office at [phone] to clarify.  ° °YOU SHOULD EXPECT: Some feelings of bloating in the abdomen. Passage of more gas than usual. Walking can help get rid of the air that was put into your GI tract during the procedure and reduce the bloating. If you had a lower endoscopy (such as a colonoscopy or flexible sigmoidoscopy) you may notice spotting of blood in your stool or on the toilet paper. Some abdominal soreness may be present for a day or two, also. ° °DIET: Your first meal following the procedure should be a light meal and then it is ok to progress to your normal diet. A half-sandwich or bowl of soup is an example of a good first meal. Heavy or fried foods are harder to digest and may make you feel nauseous or bloated. Drink plenty of fluids but you should avoid alcoholic beverages for 24 hours. If you had a esophageal dilation, please see attached instructions for diet.   ° °ACTIVITY: Your care partner should take you home directly after the procedure. You should plan to take it easy, moving slowly for the rest of the day. You can resume normal activity the day after the procedure however YOU SHOULD NOT DRIVE, use power tools, machinery or perform tasks that involve climbing or major physical exertion for 24 hours (because of the sedation medicines used during the test).  ° °SYMPTOMS TO REPORT IMMEDIATELY: °A gastroenterologist can be reached at any hour. Please call [phone]  for any of the following symptoms:  °Following lower endoscopy (colonoscopy, flexible sigmoidoscopy) °Excessive amounts of blood in the stool  °Significant tenderness, worsening of abdominal pains  °Swelling of the abdomen that is new, acute  °Fever of 100° or  higher  °Following upper endoscopy (EGD, EUS, ERCP, esophageal dilation) °Vomiting of blood or coffee ground material  °New, significant abdominal pain  °New, significant chest pain or pain under the shoulder blades  °Painful or persistently difficult swallowing  °New shortness of breath  °Black, tarry-looking or red, bloody stools ° °FOLLOW UP:  °If any biopsies were taken you will be contacted by phone or by letter within the next 1-3 weeks. Call [phone]  if you have not heard about the biopsies in 3 weeks.  °Please also call with any specific questions about appointments or follow up tests. ° °

## 2021-03-06 NOTE — H&P (Signed)
[facility] History and Physical ? ? ?Primary Care Physician:  Gildardo Pounds, NP ? ? ?Reason for Procedure:   Colon cancer screening, epigastric pain / dysphagia / GERD / early satiety ? ?Plan:    EGD and colonoscopy ? ? ? ? ?HPI: Stanley [name] is a 56 y.o. male  here for colonoscopy screening and EGD to evaluate multiple upper tract symptoms as outlined above. Patient denies any bowel symptoms at this time. No family history of colon cancer known. Otherwise feels well without any cardiopulmonary symptoms today. Case done at the hospital given his comorbidities and cardiac history. Denies complaints today. Feeling better on BID protonix since I have last seen him.  ? ?I have discussed risks / benefits of the exam and anesthesia with him, and he wishes to proceed.  ? ? ?Past Medical History:  ?Diagnosis Date  ? Acute renal failure (ARF) (HCC)   ? Acute respiratory failure with hypoxia ([place]) 12/29/2018  ? Alcohol abuse   ? Anxiety   ? CAD (coronary artery disease)   ? a. dx unclear, reported PCI in 2011 but cath 2014 normal coronaries and cardiac CT 07/2016 normal coronaries, no mention of stents.  ? Chronic chest pain   ? Chronic systolic CHF (congestive heart failure) ([place])   ? Depression   ? Dyspnea   ? 07/05/2019: per patient some times with exertion, "has to catch breath"  ? Financial difficulties   ? Gout   ? Gouty arthritis   ? Headache   ? History of noncompliance with medical treatment   ? financial challenges  ? Hypertension   ? ICD (implantable cardioverter-defibrillator) in place   ? [employer] ICD implant done in [place] 2015  ? Insomnia   ? Lobar pneumonia ([place]) 12/28/2018  ? Myocardial infarction [facility]) 2005  ? Nonischemic cardiomyopathy ([place])   ? Sleep apnea   ? per patient, has CPAP does not wear it every night  ? Ventricular tachycardia 01/2015  ? 2017 - ATP delivered for sinus tach, degenerated into VT for which he received shock. Recurrent VT 03/2018.  ? ? ?Past Surgical History:   ?Procedure Laterality Date  ? BAROREFLEX SYSTEM INSERTION    ? CARDIAC DEFIBRILLATOR PLACEMENT  06/05/2013  ? [employer] ICD implanted at [facility] [facility] in [place] for primary prevention  ? HERNIA REPAIR    ? PERCUTANEOUS CORONARY STENT INTERVENTION (PCI-S)  01/05/2009  ? ? ?Prior to Admission medications   ?Medication Sig Start Date End Date Taking? Authorizing Provider  ?allopurinol (ZYLOPRIM) 100 MG tablet Take 1 tablet (100 mg total) by mouth daily. FOR GOUT 10/03/20  Yes Gildardo Pounds, NP  ?amiodarone (PACERONE) 200 MG tablet TAKE 1 TABLET(200 MG) BY MOUTH DAILY 02/28/21  Yes Allred, Jeneen Rinks, MD  ?Ascorbic Acid (VITAMIN C) 1000 MG tablet Take 1,000 mg by mouth daily.   Yes [provider]  ?atorvastatin (LIPITOR) 20 MG tablet Take 1 tablet (20 mg total) by mouth daily. 02/19/20  Yes Gildardo Pounds, NP  ?carvedilol (COREG) 25 MG tablet TAKE 1 TABLET(25 MG) BY MOUTH TWICE DAILY WITH A MEAL 02/28/21  Yes Gildardo Pounds, NP  ?digoxin (LANOXIN) 0.125 MG tablet Take 0.5 tablets (0.0625 mg total) by mouth daily. 02/12/21  Yes Shirley Friar, PA-C  ?furosemide (LASIX) 80 MG tablet TAKE 1 TABLET BY MOUTH ON MONDAY, WEDNESDAY& FRIDAY. MAY TAKE 1 TABLET AS NEEDED FOR SWELLING 02/12/21  Yes Shirley Friar, PA-C  ?isosorbide-hydrALAZINE (BIDIL) 20-37.5 MG tablet  TAKE 1 TABLET BY MOUTH THREE TIMES DAILY 07/19/20  Yes Charlott Rakes, MD  ?losartan (COZAAR) 100 MG tablet Take 1 tablet (100 mg total) by mouth daily. 03/15/19  Yes Bensimhon, Shaune Pascal, MD  ?nitroGLYCERIN (NITROSTAT) 0.4 MG SL tablet Place 1 tablet (0.4 mg total) under the tongue every 5 (five) minutes x 3 doses as needed for chest pain. 03/22/18  Yes Baldwin Jamaica, PA-C  ?pantoprazole (PROTONIX) 40 MG tablet Take 1 tablet (40 mg total) by mouth 2 (two) times daily. 01/01/21  Yes Gildardo Pounds, NP  ?PARoxetine (PAXIL) 20 MG tablet Take 20 mg by mouth daily.   Yes [provider]  ?potassium chloride SA  (KLOR-CON) 20 MEQ tablet Take 3 tablets (60 mEq total) by mouth daily. 11/05/20  Yes Shirley Friar, PA-C  ?sodium chloride (OCEAN) 0.65 % SOLN nasal spray Place 1 spray into both nostrils daily as needed for congestion.   Yes [provider]  ?spironolactone (ALDACTONE) 25 MG tablet Take 1 tablet (25 mg total) by mouth daily. ?Patient taking differently: Take 25 mg by mouth daily as needed (fluid). 12/08/19  Yes Shirley Friar, PA-C  ?traZODone (DESYREL) 150 MG tablet TAKE 1 TABLET(150 MG) BY MOUTH AT BEDTIME ?Strength: 150 mg 02/18/21  Yes Gildardo Pounds, NP  ?hydroquinone 4 % cream Apply topically 2 (two) times daily. ?Patient not taking: Reported on 02/27/2021 01/01/21   Gildardo Pounds, NP  ?ondansetron (ZOFRAN-ODT) 4 MG disintegrating tablet Take 1 tablet (4 mg total) by mouth every 8 (eight) hours as needed for nausea or vomiting. ?Patient not taking: Reported on 01/01/2021 12/12/20   Yetta Flock, MD  ? ? ?Current Facility-Administered Medications  ?Medication Dose Route Frequency Provider Last Rate Last Admin  ? 0.9 %  sodium chloride infusion   Intravenous Continuous Mady Oubre, Carlota Raspberry, MD      ? lactated ringers infusion   Intravenous Continuous Sheyli Horwitz, Carlota Raspberry, MD 10 mL/hr at 03/06/21 1028 New Bag at 03/06/21 1028  ? ? ?Allergies as of 12/12/2020 - Review Complete 12/12/2020  ?Allergen Reaction Noted  ? Lisinopril Shortness Of Breath 06/20/2015  ? Allopurinol Nausea Only 10/28/2020  ? Apple juice Swelling 07/03/2015  ? Entresto [sacubitril-valsartan]  12/06/2019  ? Other Swelling 07/03/2015  ? Shellfish allergy Swelling 06/20/2015  ? ? ?Family History  ?Problem Relation Age of Onset  ? Breast cancer Mother   ? Pancreatic cancer Mother   ? Cancer Mother   ?     Pancreatic  ? Hypertension Father   ? Alzheimer's disease Father   ? Gout Father   ? Dementia Father   ? Hypertension Sister   ? Clotting disorder Sister   ? Obesity Brother   ? Bronchitis Brother   ? Heart  disease Maternal Grandmother   ? Gout Paternal Grandfather   ? Colon polyps Neg Hx   ? Colon cancer Neg Hx   ? Esophageal cancer Neg Hx   ? Stomach cancer Neg Hx   ? ? ?Social History  ? ?Socioeconomic History  ? Marital status: Divorced  ?  Spouse name: Not on file  ? Number of children: 1  ? Years of education: 76  ? Highest education level: Not on file  ?Occupational History  ? Occupation: Disability  ?Tobacco Use  ? Smoking status: Never  ? Smokeless tobacco: Never  ?Vaping Use  ? Vaping Use: Never used  ?Substance and Sexual Activity  ? Alcohol use: Yes  ?  Alcohol/week: 0.0  standard drinks  ?  Comment: rarely  ? Drug use: No  ? Sexual activity: Not Currently  ?Other Topics Concern  ? Not on file  ?Social History Narrative  ? Lives in [place] alone.  Disabled.  Previously worked as a Careers adviser.  ? Fun: Rest, Read and watch movies.   ? ?Social Determinants of Health  ? ?Financial Resource Strain: Not on file  ?Food Insecurity: Not on file  ?Transportation Needs: Not on file  ?Physical Activity: Not on file  ?Stress: Not on file  ?Social Connections: Not on file  ?Intimate Partner Violence: Not on file  ? ? ?Review of Systems: ?All other review of systems negative except as mentioned in the HPI. ? ?Physical Exam: ?Vital signs ?BP (!) 142/69   Pulse 60   Temp 98.3 ?F (36.8 ?C) (Oral)   Ht 5\' 5"  (1.651 m)   Wt 68 kg   SpO2 99%   BMI 24.96 kg/m?  ? ?General:   Alert,  Well-developed, pleasant and cooperative in NAD ?Lungs:  Clear throughout to auscultation.   ?Heart:  Regular rate and rhythm ?Abdomen:  Soft, nontender and nondistended.   ?Neuro/Psych:  Alert and cooperative. Normal mood and affect. A and O x 3 ? ?Jolly Mango, MD ?[facility] ? ? ?

## 2021-03-06 NOTE — Anesthesia Preprocedure Evaluation (Signed)
Anesthesia Evaluation  ? ? ?Airway ?Mallampati: II ? ?TM Distance: >3 FB ?Neck ROM: Full ? ? ? Dental ?no notable dental hx. ? ?  ?Pulmonary ?sleep apnea ,  ?  ?Pulmonary exam normal ?breath sounds clear to auscultation ? ? ? ? ? ? Cardiovascular ?hypertension, + Past MI, +CHF and + DOE  ?Normal cardiovascular exam+ Cardiac Defibrillator ? ?Rhythm:Regular Rate:Normal ? ?. Left ventricular ejection fraction, by estimation, is 25 to 30%. The  ?left ventricle has severely decreased function. The left ventricle  ?demonstrates global hypokinesis. The left ventricular internal cavity size  ?was mildly dilated. Left ventricular  ?diastolic parameters are consistent with Grade I diastolic dysfunction  ?(impaired relaxation). Elevated left atrial pressure.  ??2. Right ventricular systolic function is normal. The right ventricular  ?size is normal. There is mildly elevated pulmonary artery systolic  ?pressure. The estimated right ventricular systolic pressure is 18.2 mmHg.  ??3. The mitral valve is normal in structure and function. Mild mitral  ?valve regurgitation. No evidence of mitral stenosis.  ??4. The aortic valve is normal in structure and function. Aortic valve  ?regurgitation is not visualized. No aortic stenosis is present.  ??5. The inferior vena cava is normal in size with greater than 50%  ?respiratory variability, suggesting right atrial pressure of 3 mmHg.  ?  ?Neuro/Psych ?  ? GI/Hepatic ?  ?Endo/Other  ? ? Renal/GU ?  ? ?  ?Musculoskeletal ? ? Abdominal ?  ?Peds ? Hematology ?  ?Anesthesia Other Findings ? ? Reproductive/Obstetrics ? ?  ? ? ? ? ? ? ? ? ? ? ? ? ? ?  ?  ? ? ? ? ? ? ? ? ?Anesthesia Physical ?Anesthesia Plan ? ?ASA: 4 ? ?Anesthesia Plan: MAC  ? ?Post-op Pain Management: Minimal or no pain anticipated  ? ?Induction: Intravenous ? ?PONV Risk Score and Plan: 1 and Propofol infusion and Treatment may vary due to age or medical condition ? ?Airway Management Planned:  Simple Face Mask ? ?Additional Equipment:  ? ?Intra-op Plan:  ? ?Post-operative Plan:  ? ?Informed Consent: I have reviewed the patients History and Physical, chart, labs and discussed the procedure including the risks, benefits and alternatives for the proposed anesthesia with the patient or authorized representative who has indicated his/her understanding and acceptance.  ? ? ? ?Dental advisory given ? ?Plan Discussed with: CRNA and Surgeon ? ?Anesthesia Plan Comments:   ? ? ? ? ? ? ?Anesthesia Quick Evaluation ? ?

## 2021-03-06 NOTE — Op Note (Signed)
[facility] ?Patient Name: Stanley [name] ?Procedure Date: 03/06/2021 ?MRN: [id number] ?Attending MD: Carlota Raspberry. Havery Moros , MD ?Date of Birth: 1965/11/28 ?CSN: [id number] ?Age: 56 ?Admit Type: Outpatient ?Procedure:                Upper GI endoscopy ?Indications:              Epigastric abdominal pain, Dysphagia, Follow-up of  ?                          gastro-esophageal reflux disease, Early satiety -  ?                          improved on twice daily Protonix since clinic  ?                          visit, with exception of dysphagia which still  ?                          occurs periodically ?Providers:                Carlota Raspberry. Havery Moros, MD, Ladoris Gene, RN,  ?                          William Dalton, Technician ?Referring MD:              ?Medicines:                Monitored Anesthesia Care ?Complications:            No immediate complications. Estimated blood loss:  ?                          Minimal. ?Estimated Blood Loss:     Estimated blood loss was minimal. ?Procedure:                Pre-Anesthesia Assessment: ?                          - Prior to the procedure, a History and Physical  ?                          was performed, and patient medications and  ?                          allergies were reviewed. The patient's tolerance of  ?                          previous anesthesia was also reviewed. The risks  ?                          and benefits of the procedure and the sedation  ?                          options and risks were discussed with the patient.  ?                          All questions were answered,  and informed consent  ?                          was obtained. Prior Anticoagulants: The patient has  ?                          taken no previous anticoagulant or antiplatelet  ?                          agents. ASA Grade Assessment: IV - A patient with  ?                          severe systemic disease that is a constant threat  ?                          to life.  After reviewing the risks and benefits,  ?                          the patient was deemed in satisfactory condition to  ?                          undergo the procedure. ?                          After obtaining informed consent, the endoscope was  ?                          passed under direct vision. Throughout the  ?                          procedure, the patient's blood pressure, pulse, and  ?                          oxygen saturations were monitored continuously. The  ?                          [id number]-[id number] ([id number]) Olympus endoscope was introduced  ?                          through the mouth, and advanced to the second part  ?                          of duodenum. The upper GI endoscopy was  ?                          accomplished without difficulty. The patient  ?                          tolerated the procedure well. ?Scope In: ?Scope Out: ?Findings: ?     Esophagogastric landmarks were identified: the Z-line was found at 42  ?     cm, the gastroesophageal junction was found at 42 cm and the upper  ?     extent of the gastric folds was found at 43 cm from the incisors. ?  A 1 cm hiatal hernia was present. ?     The exam of the esophagus was otherwise normal. ?     A guidewire was placed and the scope was withdrawn. Empiric dilation was  ?     performed in the entire esophagus with a Savary dilator with mild  ?     resistance at 17 mm. Relook endoscopy showed no mucosal wrents. ?     Patchy mildly erythematous mucosa was found in the gastric body and in  ?     the gastric antrum. Biopsies were taken with a cold forceps for  ?     Helicobacter pylori testing. ?     The exam of the stomach was otherwise normal. ?     The duodenal bulb and second portion of the duodenum were normal. ?Impression:               - Esophagogastric landmarks identified. ?                          - 1 cm hiatal hernia. ?                          - Normal esophagus otherwise - empiric dilation  ?                          performed  to 49m without any mucosal wrents noted ?                          - Erythematous mucosa in the gastric body and  ?                          antrum. Biopsied. ?                          - Normal stomach otherwise ?                          - Normal duodenal bulb and second portion of the  ?                          duodenum. ?Moderate Sedation: ?     No moderate sedation, case performed with MAC ?Recommendation:           - Patient has a contact number available for  ?                          emergencies. The signs and symptoms of potential  ?                          delayed complications were discussed with the  ?                          patient. Return to normal activities tomorrow.  ?                          Written discharge instructions were provided to the  ?  patient. ?                          - Resume previous diet. ?                          - Continue present medications including twice  ?                          daily protonix ?                          - Await pathology results and course post-dilation ?Procedure Code(s):        --- Professional --- ?                          [phone], Esophagogastroduodenoscopy, flexible,  ?                          transoral; with insertion of guide wire followed by  ?                          passage of dilator(s) through esophagus over guide  ?                          wire ?                          43239, 59, Esophagogastroduodenoscopy, flexible,  ?                          transoral; with biopsy, single or multiple ?Diagnosis Code(s):        --- Professional --- ?                          K44.9, Diaphragmatic hernia without obstruction or  ?                          gangrene ?                          K31.89, Other diseases of stomach and duodenum ?                          R10.13, Epigastric pain ?                          R13.10, Dysphagia, unspecified ?                          K21.9, Gastro-esophageal reflux disease without  ?                           esophagitis ?                          R68.81, Early satiety ?CPT copyright 2019 American Medical Association. All rights reserved. ?The codes documented in this report are preliminary and upon coder review may  ?  be revised to meet current compliance requirements. ?Carlota Raspberry. Zamya Culhane, MD ?03/06/2021 11:59:35 AM ?This report has been signed electronically. ?Number of Addenda: 0 ?

## 2021-03-06 NOTE — Anesthesia Postprocedure Evaluation (Signed)
Anesthesia Post Note ? ?Patient: Stanley [name] ? ?Procedure(s) Performed: COLONOSCOPY WITH PROPOFOL ?ESOPHAGOGASTRODUODENOSCOPY (EGD) WITH PROPOFOL ?BIOPSY ?ESOPHAGEAL DILATION ?POLYPECTOMY ? ?  ? ?Patient location during evaluation: PACU ?Anesthesia Type: MAC ?Level of consciousness: awake and alert ?Pain management: pain level controlled ?Vital Signs Assessment: post-procedure vital signs reviewed and stable ?Respiratory status: spontaneous breathing, nonlabored ventilation, respiratory function stable and patient connected to nasal cannula oxygen ?Cardiovascular status: stable and blood pressure returned to baseline ?Postop Assessment: no apparent nausea or vomiting ?Anesthetic complications: no ? ? ?No notable events documented. ? ?Last Vitals:  ?Vitals:  ? 03/06/21 1015 03/06/21 1155  ?BP: (!) 142/69 120/64  ?Pulse: 60 62  ?Resp:  16  ?Temp: 36.8 ?C (!) 36.2 ?C  ?SpO2: 99% 100%  ?  ?Last Pain:  ?Vitals:  ? 03/06/21 1155  ?TempSrc: Temporal  ?PainSc: 0-No pain  ? ? ?  ?  ?  ?  ?  ?  ? ?Earlisha Sharples S ? ? ? ? ?

## 2021-03-06 NOTE — Op Note (Signed)
[facility] ?Patient Name: Stanley [name] ?Procedure Date: 03/06/2021 ?MRN: [id number] ?Attending MD: Carlota Raspberry. Havery Moros , MD ?Date of Birth: [birth year] ?CSN: [id number] ?Age: 56 ?Admit Type: Outpatient ?Procedure:                Colonoscopy ?Indications:              Screening for colorectal malignant neoplasm ?Providers:                Carlota Raspberry. Havery Moros, MD, Ladoris Gene, RN,  ?                          William Dalton, Technician ?Referring MD:              ?Medicines:                Monitored Anesthesia Care ?Complications:            No immediate complications. Estimated blood loss:  ?                          Minimal. ?Estimated Blood Loss:     Estimated blood loss was minimal. ?Procedure:                Pre-Anesthesia Assessment: ?                          - Prior to the procedure, a History and Physical  ?                          was performed, and patient medications and  ?                          allergies were reviewed. The patient's tolerance of  ?                          previous anesthesia was also reviewed. The risks  ?                          and benefits of the procedure and the sedation  ?                          options and risks were discussed with the patient.  ?                          All questions were answered, and informed consent  ?                          was obtained. Prior Anticoagulants: The patient has  ?                          taken no previous anticoagulant or antiplatelet  ?                          agents. ASA Grade Assessment: IV - A patient with  ?  severe systemic disease that is a constant threat  ?                          to life. After reviewing the risks and benefits,  ?                          the patient was deemed in satisfactory condition to  ?                          undergo the procedure. ?                          After obtaining informed consent, the colonoscope  ?                          was passed under  direct vision. Throughout the  ?                          procedure, the patient's blood pressure, pulse, and  ?                          oxygen saturations were monitored continuously. The  ?                          [id number]-HQ190L ([id number]) Olympus colonoscope was  ?                          introduced through the anus and advanced to the the  ?                          terminal ileum, with identification of the  ?                          appendiceal orifice and IC valve. The colonoscopy  ?                          was performed without difficulty. The patient  ?                          tolerated the procedure well. The quality of the  ?                          bowel preparation was good. The terminal ileum,  ?                          ileocecal valve, appendiceal orifice, and rectum  ?                          were photographed. ?Scope In: 11:29:47 AM ?Scope Out: 11:48:13 AM ?Scope Withdrawal Time: 0 hours 16 minutes 15 seconds  ?Total Procedure Duration: 0 hours 18 minutes 26 seconds  ?Findings: ?     The perianal and digital rectal examinations were normal. ?     The terminal ileum appeared normal. ?     A few  small-mouthed diverticula were found in the ascending colon and  ?     left colon. ?     Two pedunculated polyps were found in the ascending colon. The polyps  ?     were roughly 10 mm in size. These polyps were removed with a hot snare.  ?     Resection and retrieval were complete. They would not fit through the  ?     colonoscope en bloc, were cut into pieces with snare to be suctioned. To  ?     prevent bleeding after the polypectomy given location in ascending colon  ?     and the patient's comorbidities, two hemostatic clips were successfully  ?     placed (one across each polypectomy site), to reduce risk for post  ?     polypectomy bleeding. ?     Internal hemorrhoids were found during retroflexion. The hemorrhoids  ?     were small. ?     The exam was otherwise without abnormality. ?Impression:                - The examined portion of the ileum was normal. ?                          - Diverticulosis in the ascending colon and in the  ?                          left colon. ?                          - Two roughly 10 mm polyps in the ascending colon,  ?                          removed with a hot snare. Resected and retrieved.  ?                          Clips were placed. ?                          - Internal hemorrhoids. ?                          - The examination was otherwise normal. ?Moderate Sedation: ?     No moderate sedation, case performed with MAC ?Recommendation:           - Patient has a contact number available for  ?                          emergencies. The signs and symptoms of potential  ?                          delayed complications were discussed with the  ?                          patient. Return to normal activities tomorrow.  ?                          Written discharge instructions were provided to the  ?  patient. ?                          - Resume previous diet. ?                          - Continue present medications. ?                          - Await pathology results. ?                          - No ibuprofen, naproxen, or other non-steroidal  ?                          anti-inflammatory drugs for 2 weeks after polyp  ?                          removal. ?Procedure Code(s):        --- Professional --- ?                          [phone], Colonoscopy, flexible; with removal of  ?                          tumor(s), polyp(s), or other lesion(s) by snare  ?                          technique ?Diagnosis Code(s):        --- Professional --- ?                          Z12.11, Encounter for screening for malignant  ?                          neoplasm of colon ?                          K64.8, Other hemorrhoids ?                          K63.5, Polyp of colon ?                          K57.30, Diverticulosis of large intestine without  ?                           perforation or abscess without bleeding ?CPT copyright 2019 American Medical Association. All rights reserved. ?The codes documented in this report are preliminary and upon coder review may  ?be revised to meet current compliance requirements. ?Carlota Raspberry. Gaelan Glennon, MD ?03/06/2021 11:55:06 AM ?This report has been signed electronically. ?Number of Addenda: 0 ?

## 2021-03-06 NOTE — Transfer of Care (Signed)
Immediate Anesthesia Transfer of Care Note ? ?Patient: Stanley [name] ? ?Procedure(s) Performed: Procedure(s): ?COLONOSCOPY WITH PROPOFOL (N/A) ?ESOPHAGOGASTRODUODENOSCOPY (EGD) WITH PROPOFOL (N/A) ?BIOPSY ?ESOPHAGEAL DILATION ?POLYPECTOMY ? ?Patient Location: PACU ? ?Anesthesia Type:MAC ? ?Level of Consciousness:  sedated, patient cooperative and responds to stimulation ? ?Airway & Oxygen Therapy:Patient Spontanous Breathing and Patient connected to face mask oxgen ? ?Post-op Assessment:  Report given to PACU RN and Post -op Vital signs reviewed and stable ? ?Post vital signs:  Reviewed and stable ? ?Last Vitals:  ?Vitals:  ? 03/06/21 1015  ?BP: (!) 142/69  ?Pulse: 60  ?Temp: 36.8 ?C  ?SpO2: 99%  ? ? ?Complications: No apparent anesthesia complications ? ?

## 2021-03-07 LAB — SURGICAL PATHOLOGY

## 2021-03-09 ENCOUNTER — Encounter (HOSPITAL_COMMUNITY): Payer: Self-pay | Admitting: Gastroenterology

## 2021-03-14 ENCOUNTER — Other Ambulatory Visit: Payer: Self-pay | Admitting: Family Medicine

## 2021-03-14 DIAGNOSIS — F418 Other specified anxiety disorders: Secondary | ICD-10-CM

## 2021-03-14 DIAGNOSIS — F5105 Insomnia due to other mental disorder: Secondary | ICD-10-CM

## 2021-03-14 NOTE — Telephone Encounter (Signed)
Requested Prescriptions  ?Pending Prescriptions Disp Refills  ?? traZODone (DESYREL) 150 MG tablet [Pharmacy Med Name: TRAZODONE '150MG'$  (HUNDRED-FIFTY) TAB] 90 tablet 1  ?  Sig: TAKE 1 TABLET(150 MG) BY MOUTH AT BEDTIME  ?  ? Psychiatry: Antidepressants - Serotonin Modulator Passed - 03/14/2021 12:11 PM  ?  ?  Passed - Completed PHQ-2 or PHQ-9 in the last 360 days  ?  ?  Passed - Valid encounter within last 6 months  ?  Recent Outpatient Visits   ?      ? 2 months ago Essential hypertension  ? [place], [place] W, NP  ? 5 months ago Essential hypertension  ? [place], [place] W, NP  ? 11 months ago Insomnia secondary to depression with anxiety  ? [place], Vernia Buff, NP  ? 1 year ago Essential hypertension  ? [place], [place] W, NP  ? 2 years ago Nonischemic cardiomyopathy [facility])  ? [place] Elsie Stain, MD  ?  ?  ?Future Appointments   ?        ? In 2 weeks Gildardo Pounds, NP [place]  ?  ? ?  ?  ?  ? ?

## 2021-03-26 ENCOUNTER — Ambulatory Visit (INDEPENDENT_AMBULATORY_CARE_PROVIDER_SITE_OTHER): Payer: Medicaid Other

## 2021-03-26 DIAGNOSIS — I5022 Chronic systolic (congestive) heart failure: Secondary | ICD-10-CM

## 2021-03-26 DIAGNOSIS — I428 Other cardiomyopathies: Secondary | ICD-10-CM

## 2021-03-27 LAB — CUP PACEART REMOTE DEVICE CHECK
Battery Remaining Longevity: 90 mo
Battery Remaining Percentage: 83 %
Brady Statistic RV Percent Paced: 0 %
Date Time Interrogation Session: 20230322032400
HighPow Impedance: 49 Ohm
Implantable Lead Implant Date: 20150601
Implantable Lead Location: 753860
Implantable Lead Model: 295
Implantable Lead Serial Number: 134196
Implantable Pulse Generator Implant Date: 20150601
Lead Channel Impedance Value: 418 Ohm
Lead Channel Pacing Threshold Amplitude: 1.4 V
Lead Channel Pacing Threshold Pulse Width: 0.6 ms
Lead Channel Setting Pacing Amplitude: 3 V
Lead Channel Setting Pacing Pulse Width: 0.6 ms
Lead Channel Setting Sensing Sensitivity: 0.6 mV
Pulse Gen Serial Number: 190689

## 2021-04-02 ENCOUNTER — Ambulatory Visit: Payer: Medicaid Other | Admitting: Nurse Practitioner

## 2021-04-09 ENCOUNTER — Ambulatory Visit: Payer: Medicaid Other | Admitting: Hematology and Oncology

## 2021-04-09 NOTE — Progress Notes (Signed)
Remote ICD transmission.   

## 2021-04-13 NOTE — Progress Notes (Deleted)
[place] ?CONSULT NOTE ? ?Patient Care Team: ?Gildardo Pounds, NP as PCP - General (Nurse Practitioner) ?Bensimhon, Shaune Pascal, MD as PCP - Cardiology (Cardiology) ?Thompson Grayer, MD as PCP - Electrophysiology (Cardiology) ? ?CHIEF COMPLAINTS/PURPOSE OF CONSULTATION:  ? ?Leukocytosis, lymphocytosis ? ?ASSESSMENT & PLAN:  ? ?No problem-specific Assessment & Plan notes found for this encounter. ? ?No orders of the defined types were placed in this encounter. ? ?This is a very pleasant 56 year old male patient with past medical history significant for coronary artery disease, congestive heart failure, hypertension, insomnia, myocardial infarction referred to hematology for evaluation of persistent leukocytosis. ? ?3.  Family history of pancreatic cancer in mom.  I recommended genetic testing he agrees to the referral.  Referral has been placed. ? ?#4.  Chest pain radiating to left arm, intermittent, recommended that he immediately contact his cardiologist regarding this issue.  He expressed understanding. ? ?HISTORY OF PRESENTING ILLNESS:  ?Stanley [name] 56 y.o. male is here because of leukocytosis. ? ?This is a very pleasant 56 year old male patient with past medical history significant for coronary artery disease, myocardial infarction gout, hypertension referred to hematology for evaluation of persistent leukocytosis.   ?No erythromelalgia, intractable pruritus, personal history of thromboembolic episodes.  He is a non-smoker.  ?He drinks about 3 glasses of wine every weekend but previously used to drink a lot more. ? ?Interim history ? ?He is here for follow-up.  He is not doing very well today.  He has been having some loose stool and he has a colonoscopy upcoming.  He feels tired, has his cough and he feels some chest pressure with pain radiating to the left arm, has an AICD in place which has been evaluated a month ago.  He has chronic gout related pain.  Overall he does not feel very well  today. ? ?Rest of the pertinent 10 point ROS as mentioned above and unremarkable. ? ?REVIEW OF SYSTEMS:   ?Constitutional: Denies fevers, chills or abnormal night sweats ?Eyes: Denies blurriness of vision, double vision or watery eyes ?Ears, nose, mouth, throat, and face: Denies mucositis or sore throat ?Respiratory: Denies cough, dyspnea or wheezes ?Cardiovascular: Denies palpitation, chest discomfort or lower extremity swelling ?Gastrointestinal:  Denies nausea, heartburn or change in bowel habits ?Skin: Denies abnormal skin rashes ?Lymphatics: Denies new lymphadenopathy or easy bruising ?Neurological:Denies numbness, tingling or new weaknesses ?Behavioral/Psych: Mood is stable, no new changes  ?All other systems were reviewed with the patient and are negative. ? ?MEDICAL HISTORY:  ?Past Medical History:  ?Diagnosis Date  ? Acute renal failure (ARF) (HCC)   ? Acute respiratory failure with hypoxia ([place]) 12/29/2018  ? Alcohol abuse   ? Anxiety   ? CAD (coronary artery disease)   ? a. dx unclear, reported PCI in 2011 but cath 2014 normal coronaries and cardiac CT 07/2016 normal coronaries, no mention of stents.  ? Chronic chest pain   ? Chronic systolic CHF (congestive heart failure) ([place])   ? Depression   ? Dyspnea   ? 07/05/2019: per patient some times with exertion, "has to catch breath"  ? Financial difficulties   ? Gout   ? Gouty arthritis   ? Headache   ? History of noncompliance with medical treatment   ? financial challenges  ? Hypertension   ? ICD (implantable cardioverter-defibrillator) in place   ? [employer] ICD implant done in [place] 2015  ? Insomnia   ? Lobar pneumonia ([place]) 12/28/2018  ? Myocardial infarction [facility]) 2005  ?  Nonischemic cardiomyopathy ([place])   ? Sleep apnea   ? per patient, has CPAP does not wear it every night  ? Ventricular tachycardia 01/2015  ? 2017 - ATP delivered for sinus tach, degenerated into VT for which he received shock. Recurrent VT 03/2018.  ? ? ?SURGICAL HISTORY: ?Past Surgical  History:  ?Procedure Laterality Date  ? BAROREFLEX SYSTEM INSERTION    ? BIOPSY  03/06/2021  ? Procedure: BIOPSY;  Surgeon: Yetta Flock, MD;  Location: [facility];  Service: Gastroenterology;;  ? CARDIAC DEFIBRILLATOR PLACEMENT  06/05/2013  ? [employer] ICD implanted at [employer] in [place] for primary prevention  ? COLONOSCOPY WITH PROPOFOL N/A 03/06/2021  ? Procedure: COLONOSCOPY WITH PROPOFOL;  Surgeon: Yetta Flock, MD;  Location: [facility];  Service: Gastroenterology;  Laterality: N/A;  ? ESOPHAGEAL DILATION  03/06/2021  ? Procedure: ESOPHAGEAL DILATION;  Surgeon: Yetta Flock, MD;  Location: [facility];  Service: Gastroenterology;;  ? ESOPHAGOGASTRODUODENOSCOPY (EGD) WITH PROPOFOL N/A 03/06/2021  ? Procedure: ESOPHAGOGASTRODUODENOSCOPY (EGD) WITH PROPOFOL;  Surgeon: Yetta Flock, MD;  [facility]: [facility];  Service: Gastroenterology;  Laterality: N/A;  ? HERNIA REPAIR    ? PERCUTANEOUS CORONARY STENT INTERVENTION (PCI-S)  01/05/2009  ? POLYPECTOMY  03/06/2021  ? Procedure: POLYPECTOMY;  Surgeon: Yetta Flock, MD;  Location: [facility];  Service: Gastroenterology;;  ? ? ?SOCIAL HISTORY: ?Social History  ? ?Socioeconomic History  ? Marital status: Divorced  ?  Spouse name: Not on file  ? Number of children: 1  ? Years of education: 71  ? Highest education level: Not on file  ?Occupational History  ? Occupation: Disability  ?Tobacco Use  ? Smoking status: Never  ? Smokeless tobacco: Never  ?Vaping Use  ? Vaping Use: Never used  ?Substance and Sexual Activity  ? Alcohol use: Yes  ?  Alcohol/week: 0.0 standard drinks  ?  Comment: rarely  ? Drug use: No  ? Sexual activity: Not Currently  ?Other Topics Concern  ? Not on file  ?Social History Narrative  ? Lives in [place] alone.  Disabled.  Previously worked as a Careers adviser.  ? Fun: Rest, Read and watch movies.   ? ?Social Determinants of Health  ? ?Financial Resource Strain: Not on file   ?Food Insecurity: Not on file  ?Transportation Needs: Not on file  ?Physical Activity: Not on file  ?Stress: Not on file  ?Social Connections: Not on file  ?Intimate Partner Violence: Not on file  ? ? ?FAMILY HISTORY: ?Family History  ?Problem Relation Age of Onset  ? Breast cancer Mother   ? Pancreatic cancer Mother   ? Cancer Mother   ?     Pancreatic  ? Hypertension Father   ? Alzheimer's disease Father   ? Gout Father   ? Dementia Father   ? Hypertension Sister   ? Clotting disorder Sister   ? Obesity Brother   ? Bronchitis Brother   ? Heart disease Maternal Grandmother   ? Gout Paternal Grandfather   ? Colon polyps Neg Hx   ? Colon cancer Neg Hx   ? Esophageal cancer Neg Hx   ? Stomach cancer Neg Hx   ? ? ?ALLERGIES:  is allergic to lisinopril, allopurinol, apple juice, entresto [sacubitril-valsartan], other, and shellfish allergy. ? ?MEDICATIONS:  ?Current Outpatient Medications  ?Medication Sig Dispense Refill  ? allopurinol (ZYLOPRIM) 100 MG tablet Take 1 tablet (100 mg total) by mouth daily. FOR GOUT 30 tablet 6  ? amiodarone (  PACERONE) 200 MG tablet TAKE 1 TABLET(200 MG) BY MOUTH DAILY 90 tablet 3  ? Ascorbic Acid (VITAMIN C) 1000 MG tablet Take 1,000 mg by mouth daily.    ? atorvastatin (LIPITOR) 20 MG tablet Take 1 tablet (20 mg total) by mouth daily. 90 tablet 3  ? carvedilol (COREG) 25 MG tablet TAKE 1 TABLET(25 MG) BY MOUTH TWICE DAILY WITH A MEAL 180 tablet 0  ? digoxin (LANOXIN) 0.125 MG tablet Take 0.5 tablets (0.0625 mg total) by mouth daily. 45 tablet 2  ? furosemide (LASIX) 80 MG tablet TAKE 1 TABLET BY MOUTH ON MONDAY, WEDNESDAY& FRIDAY. MAY TAKE 1 TABLET AS NEEDED FOR SWELLING 45 tablet 2  ? hydroquinone 4 % cream Apply topically 2 (two) times daily. (Patient not taking: Reported on 02/27/2021) 50 g 1  ? isosorbide-hydrALAZINE (BIDIL) 20-37.5 MG tablet TAKE 1 TABLET BY MOUTH THREE TIMES DAILY 90 tablet 0  ? losartan (COZAAR) 100 MG tablet Take 1 tablet (100 mg total) by mouth daily. 30 tablet  6  ? nitroGLYCERIN (NITROSTAT) 0.4 MG SL tablet Place 1 tablet (0.4 mg total) under the tongue every 5 (five) minutes x 3 doses as needed for chest pain. 30 tablet 6  ? ondansetron (ZOFRAN-ODT) 4 MG disi

## 2021-04-14 ENCOUNTER — Inpatient Hospital Stay: Payer: Medicaid Other | Admitting: Hematology and Oncology

## 2021-04-16 ENCOUNTER — Other Ambulatory Visit: Payer: Self-pay | Admitting: Critical Care Medicine

## 2021-05-01 ENCOUNTER — Inpatient Hospital Stay: Payer: Medicaid Other

## 2021-05-01 ENCOUNTER — Other Ambulatory Visit: Payer: Self-pay

## 2021-05-01 ENCOUNTER — Inpatient Hospital Stay: Payer: Medicaid Other | Attending: Hematology and Oncology | Admitting: Hematology and Oncology

## 2021-05-01 ENCOUNTER — Encounter: Payer: Self-pay | Admitting: Hematology and Oncology

## 2021-05-01 VITALS — BP 134/95 | HR 74 | Temp 97.9°F | Resp 16 | Ht 65.0 in | Wt 153.5 lb

## 2021-05-01 DIAGNOSIS — N400 Enlarged prostate without lower urinary tract symptoms: Secondary | ICD-10-CM | POA: Diagnosis not present

## 2021-05-01 DIAGNOSIS — I428 Other cardiomyopathies: Secondary | ICD-10-CM | POA: Insufficient documentation

## 2021-05-01 DIAGNOSIS — D72829 Elevated white blood cell count, unspecified: Secondary | ICD-10-CM | POA: Insufficient documentation

## 2021-05-01 DIAGNOSIS — Z800 Family history of malignant neoplasm of digestive organs: Secondary | ICD-10-CM | POA: Diagnosis not present

## 2021-05-01 DIAGNOSIS — D72820 Lymphocytosis (symptomatic): Secondary | ICD-10-CM | POA: Diagnosis not present

## 2021-05-01 DIAGNOSIS — R5383 Other fatigue: Secondary | ICD-10-CM | POA: Diagnosis not present

## 2021-05-01 DIAGNOSIS — J029 Acute pharyngitis, unspecified: Secondary | ICD-10-CM

## 2021-05-01 DIAGNOSIS — Z95810 Presence of automatic (implantable) cardiac defibrillator: Secondary | ICD-10-CM | POA: Insufficient documentation

## 2021-05-01 DIAGNOSIS — D729 Disorder of white blood cells, unspecified: Secondary | ICD-10-CM

## 2021-05-01 DIAGNOSIS — R634 Abnormal weight loss: Secondary | ICD-10-CM | POA: Insufficient documentation

## 2021-05-01 LAB — CBC WITH DIFFERENTIAL/PLATELET
Abs Immature Granulocytes: 0.09 10*3/uL — ABNORMAL HIGH (ref 0.00–0.07)
Basophils Absolute: 0.1 10*3/uL (ref 0.0–0.1)
Basophils Relative: 1 %
Eosinophils Absolute: 0.2 10*3/uL (ref 0.0–0.5)
Eosinophils Relative: 2 %
HCT: 40.1 % (ref 39.0–52.0)
Hemoglobin: 13.5 g/dL (ref 13.0–17.0)
Immature Granulocytes: 1 %
Lymphocytes Relative: 14 %
Lymphs Abs: 2.2 10*3/uL (ref 0.7–4.0)
MCH: 32.7 pg (ref 26.0–34.0)
MCHC: 33.7 g/dL (ref 30.0–36.0)
MCV: 97.1 fL (ref 80.0–100.0)
Monocytes Absolute: 1 10*3/uL (ref 0.1–1.0)
Monocytes Relative: 7 %
Neutro Abs: 11.6 10*3/uL — ABNORMAL HIGH (ref 1.7–7.7)
Neutrophils Relative %: 75 %
Platelets: 300 10*3/uL (ref 150–400)
RBC: 4.13 MIL/uL — ABNORMAL LOW (ref 4.22–5.81)
RDW: 12.5 % (ref 11.5–15.5)
WBC: 15.2 10*3/uL — ABNORMAL HIGH (ref 4.0–10.5)
nRBC: 0 % (ref 0.0–0.2)

## 2021-05-01 LAB — CMP (CANCER CENTER ONLY)
ALT: 21 U/L (ref 0–44)
AST: 28 U/L (ref 15–41)
Albumin: 4 g/dL (ref 3.5–5.0)
Alkaline Phosphatase: 56 U/L (ref 38–126)
Anion gap: 10 (ref 5–15)
BUN: 18 mg/dL (ref 6–20)
CO2: 26 mmol/L (ref 22–32)
Calcium: 9.2 mg/dL (ref 8.9–10.3)
Chloride: 105 mmol/L (ref 98–111)
Creatinine: 1.18 mg/dL (ref 0.61–1.24)
GFR, Estimated: >60 mL/min (ref >60)
Glucose, Bld: 94 mg/dL (ref 70–99)
Potassium: 3.1 mmol/L — ABNORMAL LOW (ref 3.5–5.1)
Sodium: 141 mmol/L (ref 135–145)
Total Bilirubin: 0.7 mg/dL (ref 0.3–1.2)
Total Protein: 8.5 g/dL — ABNORMAL HIGH (ref 6.5–8.1)

## 2021-05-01 NOTE — Progress Notes (Signed)
Stanley [name] ?CONSULT NOTE ? ?Patient Care Team: ?Gildardo Pounds, NP as PCP - General (Nurse Practitioner) ?Bensimhon, Shaune Pascal, MD as PCP - Cardiology (Cardiology) ?Thompson Grayer, MD as PCP - Electrophysiology (Cardiology) ? ?CHIEF COMPLAINTS/PURPOSE OF CONSULTATION:  ? ?Leukocytosis, lymphocytosis ? ?ASSESSMENT & PLAN:  ? ?This is a very pleasant 56 year old male patient with past medical history significant for coronary artery disease, congestive heart failure, hypertension, insomnia, myocardial infarction referred to hematology for evaluation of persistent leukocytosis.  Since last visit, he continues to lose weight, tells me that he has lost about 25 pounds in the past 6 months.  He does not feel well, has low appetite.  He had an endoscopy and colonoscopy which did not show any evidence of malignancy.  He follows up with urology for benign prostatic hyperplasia.  He denies any B symptoms or recent infections.  He does have an AICD in place for cardiac dysfunction and continues to be monitored by cardiology. ?During his previous visits we have planned myeloproliferative panel which was unremarkable.  We have been monitoring his labs closely.  No change in leukocytosis compared to April 2022 no anemia no thrombocytopenia.  Many years ago when he was in [place], he had a bone marrow aspiration and biopsy for the same complaint and this according to the patient was unremarkable. ?At this time I do not believe his current symptoms are related to his leukocytosis.  He very well could have progressive fatigue because of his cardiac history.  He should continue follow-up with his PCP and do all age-appropriate cancer screening. ? ?I have referred him to genetics in the past but not quite sure if he was able to make to his appointment.  I will once again send another referral.  Family history significant for pancreatic cancer in mom. ? ?Unexplained weight loss, I will order CT abdomen/pelvis given his  mom with pancreatic cancer. I called him to inform this. I gave the phone number to call and schedule. ? ?HISTORY OF PRESENTING ILLNESS:  ?Stanley [name] 56 y.o. male is here because of leukocytosis. ? ?This is a very pleasant 56 year old male patient with past medical history significant for coronary artery disease, myocardial infarction gout, hypertension referred to hematology for evaluation of persistent leukocytosis.   ?No erythromelalgia, intractable pruritus, personal history of thromboembolic episodes.  He is a non-smoker.  ?He drinks about 3 glasses of wine every weekend but previously used to drink a lot more. ? ?Interim history ? ?He is here for follow-up.  He says he is not feeling well. He lost about 30 lbs of weight in the past 6 months.  He tells me that his body is trying to tell him something and he does not feel well.  He is worried that he could have some kind of cancer.  He had an endoscopy and colonoscopy which did not show any evidence of malignancy.  Follows up with urology for benign prostatic hyperplasia.  He also has nonischemic cardiomyopathy with an AICD in place.  No other B symptoms reported.  No change in breathing/bowel habits or urinary habits. ? ?Rest of the pertinent 10 point ROS as mentioned above  ?MEDICAL HISTORY:  ?Past Medical History:  ?Diagnosis Date  ? Acute renal failure (ARF) (HCC)   ? Acute respiratory failure with hypoxia ([place]) 12/29/2018  ? Alcohol abuse   ? Anxiety   ? CAD (coronary artery disease)   ? a. dx unclear, reported PCI in 2011 but cath 2014  normal coronaries and cardiac CT 07/2016 normal coronaries, no mention of stents.  ? Chronic chest pain   ? Chronic systolic CHF (congestive heart failure) ([place])   ? Depression   ? Dyspnea   ? 07/05/2019: per patient some times with exertion, "has to catch breath"  ? Financial difficulties   ? Gout   ? Gouty arthritis   ? Headache   ? History of noncompliance with medical treatment   ? financial challenges  ?  Hypertension   ? ICD (implantable cardioverter-defibrillator) in place   ? [employer] ICD implant done in [place] 2015  ? Insomnia   ? Lobar pneumonia ([place]) 12/28/2018  ? Myocardial infarction [facility]) 2005  ? Nonischemic cardiomyopathy ([place])   ? Sleep apnea   ? per patient, has CPAP does not wear it every night  ? Ventricular tachycardia 01/2015  ? 2017 - ATP delivered for sinus tach, degenerated into VT for which he received shock. Recurrent VT 03/2018.  ? ? ?SURGICAL HISTORY: ?Past Surgical History:  ?Procedure Laterality Date  ? BAROREFLEX SYSTEM INSERTION    ? BIOPSY  03/06/2021  ? Procedure: BIOPSY;  Surgeon: Yetta Flock, MD;  Location: [facility];  Service: Gastroenterology;;  ? CARDIAC DEFIBRILLATOR PLACEMENT  06/05/2013  ? [employer] ICD implanted at [employer] in [place] for primary prevention  ? COLONOSCOPY WITH PROPOFOL N/A 03/06/2021  ? Procedure: COLONOSCOPY WITH PROPOFOL;  Surgeon: Yetta Flock, MD;  [facility];  Service: Gastroenterology;  Laterality: N/A;  ? ESOPHAGEAL DILATION  03/06/2021  ? Procedure: ESOPHAGEAL DILATION;  Surgeon: Yetta Flock, MD;  [facility];  Service: Gastroenterology;;  ? ESOPHAGOGASTRODUODENOSCOPY (EGD) WITH PROPOFOL N/A 03/06/2021  ? Procedure: ESOPHAGOGASTRODUODENOSCOPY (EGD) WITH PROPOFOL;  Surgeon: Yetta Flock, MD;  [facility];  Service: Gastroenterology;  Laterality: N/A;  ? HERNIA REPAIR    ? PERCUTANEOUS CORONARY STENT INTERVENTION (PCI-S)  01/05/2009  ? POLYPECTOMY  03/06/2021  ? Procedure: POLYPECTOMY;  Surgeon: Yetta Flock, MD;  Location: [facility];  Service: Gastroenterology;;  ? ? ?SOCIAL HISTORY: ?Social History  ? ?Socioeconomic History  ? Marital status: Divorced  ?  Spouse name: Not on file  ? Number of children: 1  ? Years of education: 29  ? Highest education level: Not on file  ?Occupational History  ? Occupation: Disability  ?Tobacco Use  ? Smoking status: Never  ?  Smokeless tobacco: Never  ?Vaping Use  ? Vaping Use: Never used  ?Substance and Sexual Activity  ? Alcohol use: Yes  ?  Alcohol/week: 0.0 standard drinks  ?  Comment: rarely  ? Drug use: No  ? Sexual activity: Not Currently  ?Other Topics Concern  ? Not on file  ?Social History Narrative  ? Lives in [place] alone.  Disabled.  Previously worked as a Careers adviser.  ? Fun: Rest, Read and watch movies.   ? ?Social Determinants of Health  ? ?Financial Resource Strain: Not on file  ?Food Insecurity: Not on file  ?Transportation Needs: Not on file  ?Physical Activity: Not on file  ?Stress: Not on file  ?Social Connections: Not on file  ?Intimate Partner Violence: Not on file  ? ? ?FAMILY HISTORY: ?Family History  ?Problem Relation Age of Onset  ? Breast cancer Mother   ? Pancreatic cancer Mother   ? Cancer Mother   ?     Pancreatic  ? Hypertension Father   ? Alzheimer's disease Father   ? Gout Father   ?  Dementia Father   ? Hypertension Sister   ? Clotting disorder Sister   ? Obesity Brother   ? Bronchitis Brother   ? Heart disease Maternal Grandmother   ? Gout Paternal Grandfather   ? Colon polyps Neg Hx   ? Colon cancer Neg Hx   ? Esophageal cancer Neg Hx   ? Stomach cancer Neg Hx   ? ? ?ALLERGIES:  is allergic to lisinopril, allopurinol, apple juice, entresto [sacubitril-valsartan], other, and shellfish allergy. ? ?MEDICATIONS:  ?Current Outpatient Medications  ?Medication Sig Dispense Refill  ? allopurinol (ZYLOPRIM) 100 MG tablet Take 1 tablet (100 mg total) by mouth daily. FOR GOUT 30 tablet 6  ? amiodarone (PACERONE) 200 MG tablet TAKE 1 TABLET(200 MG) BY MOUTH DAILY 90 tablet 3  ? Ascorbic Acid (VITAMIN C) 1000 MG tablet Take 1,000 mg by mouth daily.    ? atorvastatin (LIPITOR) 20 MG tablet Take 1 tablet (20 mg total) by mouth daily. 90 tablet 3  ? carvedilol (COREG) 25 MG tablet TAKE 1 TABLET(25 MG) BY MOUTH TWICE DAILY WITH A MEAL 180 tablet 0  ? digoxin (LANOXIN) 0.125 MG tablet Take 0.5 tablets  (0.0625 mg total) by mouth daily. 45 tablet 2  ? furosemide (LASIX) 80 MG tablet TAKE 1 TABLET BY MOUTH ON MONDAY, WEDNESDAY& FRIDAY. MAY TAKE 1 TABLET AS NEEDED FOR SWELLING 45 tablet 2  ? hydroquinone 4 % cream Appl

## 2021-05-02 ENCOUNTER — Telehealth: Payer: Self-pay | Admitting: Genetic Counselor

## 2021-05-02 NOTE — Telephone Encounter (Signed)
Per 4/27 in basket called and spoke to pt about GEN appointment pt confirmed appointment  ?

## 2021-05-17 ENCOUNTER — Emergency Department (HOSPITAL_COMMUNITY)
Admission: EM | Admit: 2021-05-17 | Discharge: 2021-05-17 | Disposition: A | Discharge status: Home / Self Care | Payer: Medicaid Other | Attending: Emergency Medicine | Admitting: Emergency Medicine

## 2021-05-17 ENCOUNTER — Other Ambulatory Visit: Payer: Self-pay

## 2021-05-17 ENCOUNTER — Emergency Department (HOSPITAL_COMMUNITY): Payer: Medicare Other

## 2021-05-17 ENCOUNTER — Emergency Department (HOSPITAL_COMMUNITY): Payer: Medicaid Other

## 2021-05-17 ENCOUNTER — Inpatient Hospital Stay (HOSPITAL_COMMUNITY)
Admission: EM | Admit: 2021-05-17 | Discharge: 2021-05-20 | DRG: 378 | Disposition: A | Discharge status: Home / Self Care | Payer: Medicare Other | Attending: Internal Medicine | Admitting: Internal Medicine

## 2021-05-17 ENCOUNTER — Encounter (HOSPITAL_COMMUNITY): Payer: Self-pay | Admitting: *Deleted

## 2021-05-17 DIAGNOSIS — D62 Acute posthemorrhagic anemia: Secondary | ICD-10-CM | POA: Diagnosis present

## 2021-05-17 DIAGNOSIS — Z825 Family history of asthma and other chronic lower respiratory diseases: Secondary | ICD-10-CM

## 2021-05-17 DIAGNOSIS — Z8249 Family history of ischemic heart disease and other diseases of the circulatory system: Secondary | ICD-10-CM

## 2021-05-17 DIAGNOSIS — K219 Gastro-esophageal reflux disease without esophagitis: Secondary | ICD-10-CM

## 2021-05-17 DIAGNOSIS — I252 Old myocardial infarction: Secondary | ICD-10-CM

## 2021-05-17 DIAGNOSIS — K449 Diaphragmatic hernia without obstruction or gangrene: Secondary | ICD-10-CM | POA: Diagnosis present

## 2021-05-17 DIAGNOSIS — K254 Chronic or unspecified gastric ulcer with hemorrhage: Principal | ICD-10-CM | POA: Diagnosis present

## 2021-05-17 DIAGNOSIS — Z20822 Contact with and (suspected) exposure to covid-19: Secondary | ICD-10-CM | POA: Insufficient documentation

## 2021-05-17 DIAGNOSIS — I5022 Chronic systolic (congestive) heart failure: Secondary | ICD-10-CM | POA: Diagnosis present

## 2021-05-17 DIAGNOSIS — K922 Gastrointestinal hemorrhage, unspecified: Principal | ICD-10-CM

## 2021-05-17 DIAGNOSIS — R7989 Other specified abnormal findings of blood chemistry: Secondary | ICD-10-CM | POA: Diagnosis not present

## 2021-05-17 DIAGNOSIS — F32A Depression, unspecified: Secondary | ICD-10-CM | POA: Diagnosis present

## 2021-05-17 DIAGNOSIS — G8929 Other chronic pain: Secondary | ICD-10-CM | POA: Diagnosis present

## 2021-05-17 DIAGNOSIS — Z820 Family history of epilepsy and other diseases of the nervous system: Secondary | ICD-10-CM

## 2021-05-17 DIAGNOSIS — Z955 Presence of coronary angioplasty implant and graft: Secondary | ICD-10-CM

## 2021-05-17 DIAGNOSIS — Z832 Family history of diseases of the blood and blood-forming organs and certain disorders involving the immune mechanism: Secondary | ICD-10-CM

## 2021-05-17 DIAGNOSIS — K920 Hematemesis: Secondary | ICD-10-CM | POA: Diagnosis present

## 2021-05-17 DIAGNOSIS — Z7141 Alcohol abuse counseling and surveillance of alcoholic: Secondary | ICD-10-CM

## 2021-05-17 DIAGNOSIS — K222 Esophageal obstruction: Secondary | ICD-10-CM | POA: Diagnosis present

## 2021-05-17 DIAGNOSIS — M109 Gout, unspecified: Secondary | ICD-10-CM | POA: Diagnosis present

## 2021-05-17 DIAGNOSIS — R079 Chest pain, unspecified: Secondary | ICD-10-CM | POA: Diagnosis present

## 2021-05-17 DIAGNOSIS — E876 Hypokalemia: Secondary | ICD-10-CM | POA: Diagnosis not present

## 2021-05-17 DIAGNOSIS — Y92009 Unspecified place in unspecified non-institutional (private) residence as the place of occurrence of the external cause: Secondary | ICD-10-CM

## 2021-05-17 DIAGNOSIS — N179 Acute kidney failure, unspecified: Secondary | ICD-10-CM | POA: Diagnosis present

## 2021-05-17 DIAGNOSIS — I428 Other cardiomyopathies: Secondary | ICD-10-CM | POA: Diagnosis present

## 2021-05-17 DIAGNOSIS — G4730 Sleep apnea, unspecified: Secondary | ICD-10-CM | POA: Diagnosis present

## 2021-05-17 DIAGNOSIS — I509 Heart failure, unspecified: Secondary | ICD-10-CM | POA: Insufficient documentation

## 2021-05-17 DIAGNOSIS — Z888 Allergy status to other drugs, medicaments and biological substances status: Secondary | ICD-10-CM

## 2021-05-17 DIAGNOSIS — K5731 Diverticulosis of large intestine without perforation or abscess with bleeding: Secondary | ICD-10-CM | POA: Diagnosis present

## 2021-05-17 DIAGNOSIS — I10 Essential (primary) hypertension: Secondary | ICD-10-CM | POA: Diagnosis present

## 2021-05-17 DIAGNOSIS — Z95810 Presence of automatic (implantable) cardiac defibrillator: Secondary | ICD-10-CM

## 2021-05-17 DIAGNOSIS — R1310 Dysphagia, unspecified: Secondary | ICD-10-CM | POA: Diagnosis present

## 2021-05-17 DIAGNOSIS — Z79899 Other long term (current) drug therapy: Secondary | ICD-10-CM

## 2021-05-17 DIAGNOSIS — F419 Anxiety disorder, unspecified: Secondary | ICD-10-CM | POA: Diagnosis present

## 2021-05-17 DIAGNOSIS — F1010 Alcohol abuse, uncomplicated: Secondary | ICD-10-CM | POA: Diagnosis present

## 2021-05-17 DIAGNOSIS — Z800 Family history of malignant neoplasm of digestive organs: Secondary | ICD-10-CM

## 2021-05-17 DIAGNOSIS — Z91018 Allergy to other foods: Secondary | ICD-10-CM

## 2021-05-17 DIAGNOSIS — Z803 Family history of malignant neoplasm of breast: Secondary | ICD-10-CM

## 2021-05-17 DIAGNOSIS — E785 Hyperlipidemia, unspecified: Secondary | ICD-10-CM | POA: Diagnosis present

## 2021-05-17 DIAGNOSIS — K5730 Diverticulosis of large intestine without perforation or abscess without bleeding: Secondary | ICD-10-CM

## 2021-05-17 DIAGNOSIS — I2510 Atherosclerotic heart disease of native coronary artery without angina pectoris: Secondary | ICD-10-CM | POA: Diagnosis present

## 2021-05-17 DIAGNOSIS — Y900 Blood alcohol level of less than 20 mg/100 ml: Secondary | ICD-10-CM | POA: Diagnosis present

## 2021-05-17 DIAGNOSIS — T39315A Adverse effect of propionic acid derivatives, initial encounter: Secondary | ICD-10-CM | POA: Diagnosis present

## 2021-05-17 DIAGNOSIS — I110 Hypertensive heart disease with heart failure: Secondary | ICD-10-CM | POA: Diagnosis present

## 2021-05-17 DIAGNOSIS — Z91013 Allergy to seafood: Secondary | ICD-10-CM

## 2021-05-17 LAB — BASIC METABOLIC PANEL
Anion gap: 10 (ref 5–15)
Anion gap: 8 (ref 5–15)
Anion gap: 9 (ref 5–15)
BUN: 76 mg/dL — ABNORMAL HIGH (ref 6–20)
BUN: 71 mg/dL — ABNORMAL HIGH (ref 6–20)
BUN: 66 mg/dL — ABNORMAL HIGH (ref 6–20)
CO2: 24 mmol/L (ref 22–32)
CO2: 19 mmol/L — ABNORMAL LOW (ref 22–32)
CO2: 18 mmol/L — ABNORMAL LOW (ref 22–32)
Calcium: 9.3 mg/dL (ref 8.9–10.3)
Calcium: 8.4 mg/dL — ABNORMAL LOW (ref 8.9–10.3)
Calcium: 8.7 mg/dL — ABNORMAL LOW (ref 8.9–10.3)
Chloride: 104 mmol/L (ref 98–111)
Chloride: 111 mmol/L (ref 98–111)
Chloride: 111 mmol/L (ref 98–111)
Creatinine, Ser: 1.44 mg/dL — ABNORMAL HIGH (ref 0.61–1.24)
Creatinine, Ser: 1.31 mg/dL — ABNORMAL HIGH (ref 0.61–1.24)
Creatinine, Ser: 1.43 mg/dL — ABNORMAL HIGH (ref 0.61–1.24)
GFR, Estimated: 57 mL/min — ABNORMAL LOW (ref >60)
GFR, Estimated: >60 mL/min (ref >60)
GFR, Estimated: 57 mL/min — ABNORMAL LOW (ref >60)
Glucose, Bld: 130 mg/dL — ABNORMAL HIGH (ref 70–99)
Glucose, Bld: 123 mg/dL — ABNORMAL HIGH (ref 70–99)
Glucose, Bld: 127 mg/dL — ABNORMAL HIGH (ref 70–99)
Potassium: 2.6 mmol/L — CL (ref 3.5–5.1)
Potassium: 4.5 mmol/L (ref 3.5–5.1)
Potassium: 3.2 mmol/L — ABNORMAL LOW (ref 3.5–5.1)
Sodium: 138 mmol/L (ref 135–145)
Sodium: 138 mmol/L (ref 135–145)
Sodium: 138 mmol/L (ref 135–145)

## 2021-05-17 LAB — CBC
HCT: 34.5 % — ABNORMAL LOW (ref 39.0–52.0)
Hemoglobin: 11.7 g/dL — ABNORMAL LOW (ref 13.0–17.0)
MCH: 33.5 pg (ref 26.0–34.0)
MCHC: 33.9 g/dL (ref 30.0–36.0)
MCV: 98.9 fL (ref 80.0–100.0)
Platelets: 329 10*3/uL (ref 150–400)
RBC: 3.49 MIL/uL — ABNORMAL LOW (ref 4.22–5.81)
RDW: 12.3 % (ref 11.5–15.5)
WBC: 18.4 10*3/uL — ABNORMAL HIGH (ref 4.0–10.5)
nRBC: 0 % (ref 0.0–0.2)

## 2021-05-17 LAB — CBC WITH DIFFERENTIAL/PLATELET
Abs Immature Granulocytes: 0.21 10*3/uL — ABNORMAL HIGH (ref 0.00–0.07)
Basophils Absolute: 0.1 10*3/uL (ref 0.0–0.1)
Basophils Relative: 0 %
Eosinophils Absolute: 0 10*3/uL (ref 0.0–0.5)
Eosinophils Relative: 0 %
HCT: 26.8 % — ABNORMAL LOW (ref 39.0–52.0)
Hemoglobin: 8.8 g/dL — ABNORMAL LOW (ref 13.0–17.0)
Immature Granulocytes: 1 %
Lymphocytes Relative: 16 %
Lymphs Abs: 3.3 10*3/uL (ref 0.7–4.0)
MCH: 33.1 pg (ref 26.0–34.0)
MCHC: 32.8 g/dL (ref 30.0–36.0)
MCV: 100.8 fL — ABNORMAL HIGH (ref 80.0–100.0)
Monocytes Absolute: 1.4 10*3/uL — ABNORMAL HIGH (ref 0.1–1.0)
Monocytes Relative: 7 %
Neutro Abs: 15.4 10*3/uL — ABNORMAL HIGH (ref 1.7–7.7)
Neutrophils Relative %: 76 %
Platelets: 298 10*3/uL (ref 150–400)
RBC: 2.66 MIL/uL — ABNORMAL LOW (ref 4.22–5.81)
RDW: 12.2 % (ref 11.5–15.5)
WBC: 20.3 10*3/uL — ABNORMAL HIGH (ref 4.0–10.5)
nRBC: 0 % (ref 0.0–0.2)

## 2021-05-17 LAB — HEPATIC FUNCTION PANEL
ALT: 17 U/L (ref 0–44)
AST: 19 U/L (ref 15–41)
Albumin: 3.6 g/dL (ref 3.5–5.0)
Alkaline Phosphatase: 61 U/L (ref 38–126)
Bilirubin, Direct: <0.1 mg/dL (ref 0.0–0.2)
Total Bilirubin: 0.9 mg/dL (ref 0.3–1.2)
Total Protein: 8.1 g/dL (ref 6.5–8.1)

## 2021-05-17 LAB — TROPONIN I (HIGH SENSITIVITY)
Troponin I (High Sensitivity): 13 ng/L (ref <18)
Troponin I (High Sensitivity): 17 ng/L (ref <18)
Troponin I (High Sensitivity): 17 ng/L (ref <18)

## 2021-05-17 LAB — MAGNESIUM: Magnesium: 2.2 mg/dL (ref 1.7–2.4)

## 2021-05-17 LAB — PROTIME-INR
INR: 1.1 (ref 0.8–1.2)
Prothrombin Time: 13.7 seconds (ref 11.4–15.2)

## 2021-05-17 LAB — RAPID URINE DRUG SCREEN, HOSP PERFORMED
Amphetamines: NOT DETECTED
Barbiturates: NOT DETECTED
Benzodiazepines: NOT DETECTED
Cocaine: NOT DETECTED
Opiates: NOT DETECTED
Tetrahydrocannabinol: NOT DETECTED

## 2021-05-17 LAB — RESP PANEL BY RT-PCR (FLU A&B, COVID) ARPGX2
Influenza A by PCR: NEGATIVE
Influenza B by PCR: NEGATIVE
SARS Coronavirus 2 by RT PCR: NEGATIVE

## 2021-05-17 LAB — ETHANOL: Alcohol, Ethyl (B): <10 mg/dL (ref <10)

## 2021-05-17 LAB — POC OCCULT BLOOD, ED: Fecal Occult Bld: POSITIVE — AB

## 2021-05-17 MED ORDER — PANTOPRAZOLE SODIUM 40 MG IV SOLR
40.0000 mg | Freq: Once | INTRAVENOUS | Status: AC
  Administered 2021-05-17: 40 mg via INTRAVENOUS
  Filled 2021-05-17: qty 10

## 2021-05-17 MED ORDER — ONDANSETRON HCL 4 MG/2ML IJ SOLN: 4.0000 mg | Freq: Once | INTRAMUSCULAR | Status: DC | PRN

## 2021-05-17 MED ORDER — PANTOPRAZOLE SODIUM 40 MG PO TBEC: 40.0000 mg | DELAYED_RELEASE_TABLET | Freq: Two times a day (BID) | ORAL | 0 refills | Status: DC

## 2021-05-17 MED ORDER — POTASSIUM CHLORIDE 10 MEQ/100ML IV SOLN
10.0000 meq | INTRAVENOUS | Status: AC
  Administered 2021-05-17 (×3): 10 meq via INTRAVENOUS
  Filled 2021-05-17 (×3): qty 100

## 2021-05-17 MED ORDER — AMIODARONE HCL 200 MG PO TABS: 200.0000 mg | ORAL_TABLET | Freq: Every day | ORAL | 0 refills | Status: DC

## 2021-05-17 MED ORDER — ACETAMINOPHEN 325 MG PO TABS
650.0000 mg | ORAL_TABLET | Freq: Once | ORAL | Status: AC
Start: 2021-05-17 — End: 2021-05-17
  Administered 2021-05-17: 650 mg via ORAL
  Filled 2021-05-17: qty 2

## 2021-05-17 MED ORDER — AMIODARONE HCL 200 MG PO TABS
200.0000 mg | ORAL_TABLET | Freq: Every day | ORAL | Status: DC
  Administered 2021-05-17: 200 mg via ORAL
  Filled 2021-05-17: qty 1

## 2021-05-17 MED ORDER — POTASSIUM CHLORIDE CRYS ER 20 MEQ PO TBCR
40.0000 meq | EXTENDED_RELEASE_TABLET | Freq: Once | ORAL | Status: AC
  Administered 2021-05-17: 40 meq via ORAL
  Filled 2021-05-17: qty 2

## 2021-05-17 MED ORDER — ONDANSETRON HCL 4 MG/2ML IJ SOLN
4.0000 mg | INTRAMUSCULAR | Status: AC
  Administered 2021-05-17: 4 mg via INTRAVENOUS
  Filled 2021-05-17: qty 2

## 2021-05-17 NOTE — Discharge Instructions (Addendum)
Your potassium was found to be quite low today.  Please restart taking the potassium that you are prescribed.  Please start taking the Protonix that I prescribed for you.  Please follow-up with [facility] to further evaluate the bleeding that used to be happening in your intestines.  You may always return to the emergency room for any new or concerning symptoms.  Please restart taking your amiodarone.  Please take it as prescribed.  Please follow-up with Dr. [name] I given you the information for his office ?

## 2021-05-17 NOTE — ED Triage Notes (Signed)
The pt is c/OBJECTIVE: chest pain sob bloody vomit and bloody stools since Wednesday  he was treated and released from [place] ed  he came here after he left  there with the same complaints ?

## 2021-05-17 NOTE — ED Triage Notes (Signed)
Pt presents with L sided CP radiating to his arm described as "throbbing". Pt reports pain started yesterday. Also during triage pt reports ongoing N/V, gout flare up, hematemesis, hematochezia. Pt is not on blood thinners. Hx of three MI's in the past.  ?

## 2021-05-17 NOTE — ED Notes (Signed)
Pt is vomiting blood, provider notified.  ?

## 2021-05-17 NOTE — ED Provider Notes (Signed)
I provided a substantive portion of the care of this patient.  I personally performed the entirety of the history, exam, and medical decision making for this encounter. ? ?EKG Interpretation ? ?Date/Time:  Saturday May 17 2021 10:13:28 EDT ?Ventricular Rate:  121 ?PR Interval:  128 ?QRS Duration: 134 ?QT Interval:  380 ?QTC Calculation: 539 ?R Axis:   -31 ?Text Interpretation: Interpretation limited secondary to artifact When compared with ECG of 15-Mar-2019 12:16, PREVIOUS ECG IS PRESENT Questionable paced Confirmed by Fredia Sorrow [phone]) on 05/17/2021 10:56:00 AM ? ?Patient seen by me along with the physician assistant. ? ?Patient followed by heart care and seen Dr. Rayann Heman and Dr. [name].  Last seen by them in January 2023.  Patient known to have systolic heart failure due to NICM diagnosed in 2006.  Felt secondary to hypertension and alcohol abuse.  Had a coronary disease status post PCI unknown vessel 2011 but had a cath in 214 without coronary disease and normal coronaries by CT again in July 2018.  Had difficulty with ventricular tachycardia so got a [employer] ICD implant in [place] in 2015.  Patient known for his noncompliance chronic noncardiac chest pain depression hypertension alcohol abuse. ? ?Patient presenting today with intermittent chest pain by the story he gave me none of the chest pain never lasting more than 5 minutes.  Actually usually lasts a couple minutes.  Is been going on since yesterday. ? ?Work-up here troponins without significant abnormality.  This first 1 was 13 repeat was 17.  No significant change.  Point-of-care fecal occult was positive.  Some concerns for a upper GI bleed.  Because patient's BUN was elevated as well at 76 potassium of 2.6.  We given IV potassium here and oral potassium.  GFR fairly normal at 57.  Had a leukocytosis with a white count of 18.4 hemoglobin down a little bit at 11.7 but not critical.  Platelets were normal.  Alcohol was less than 10  INR was normal magnesium was normal chest x-ray negative. ? ?Physician assistant discussed it with cardiology and they recommended repeat BMP.  And outpatient follow-up by them. ?  ?Fredia Sorrow, MD ?05/17/21 1444 ? ?

## 2021-05-17 NOTE — ED Provider Notes (Signed)
?[facility] DEPT ?Provider Note ? ? ?CSN: [id number] ?Arrival date & time: 05/17/21  0954 ? ?  ? ?History ? ?Chief Complaint  ?Patient presents with  ? Chest Pain  ? ? ?Stanley [name] is a 56 y.o. male. ? ? ?Chest Pain ? ?Patient past medical history significant for CHF thought to be due to nonischemic cardiomyopathy discharged in 2006 alcohol was thought to be the insult.  Seems to have some degree of CAD however he is informed prior history checkers that he had PCI done in 2011 seems that cath in 2014 however was without significant CAD.  He has a Chemical engineer ICD implant.  He has had issues with noncompliance of his medications. EF  ? ?Patient is a 56 year old male presented emergency room today with complaints of chest pain since 3 PM yesterday.  Seems to have been intermittent episodes of 10 to 15 minutes of pain is nonexertional  ? ?Endorses nausea and vomiting but states this has been going on for the past 3 to 4 days.  He states over the past 3 to 4 days he has been more fatigued he has been vomiting he had 3-4 episodes of emesis per day over the past few days over the past 2 days he has had episodes of bright red blood in his emesis.  Some BRBPR as well.  He states this is not new and he recently had a colonoscopy and upper endoscopy to evaluate this but was told that they were normal. ? ?He states his symptoms are seem to come and go without any provocation and do not seem to be exertional.  Denies any fevers cough congestion lightheadedness or dizziness. ? ? ?He also stopped taking his amiodarone 3 days ago he states that he feels that this makes him feel fatigued ?  ? ?Home Medications ?Prior to Admission medications   ?Medication Sig Start Date End Date Taking? Authorizing Provider  ?allopurinol (ZYLOPRIM) 100 MG tablet Take 1 tablet (100 mg total) by mouth daily. FOR GOUT 10/03/20  Yes Gildardo Pounds, NP  ?Ascorbic Acid (VITAMIN C) 1000 MG tablet Take 1,000 mg by  mouth daily.   Yes [provider]  ?atorvastatin (LIPITOR) 20 MG tablet Take 1 tablet (20 mg total) by mouth daily. 02/19/20  Yes Gildardo Pounds, NP  ?carvedilol (COREG) 25 MG tablet TAKE 1 TABLET(25 MG) BY MOUTH TWICE DAILY WITH A MEAL ?Patient taking differently: Take 25 mg by mouth 2 (two) times daily with a meal. 02/28/21  Yes Gildardo Pounds, NP  ?digoxin (LANOXIN) 0.125 MG tablet Take 0.5 tablets (0.0625 mg total) by mouth daily. 02/12/21  Yes Shirley Friar, PA-C  ?furosemide (LASIX) 80 MG tablet TAKE 1 TABLET BY MOUTH ON MONDAY, WEDNESDAY& FRIDAY. MAY TAKE 1 TABLET AS NEEDED FOR SWELLING ?Patient taking differently: Take 80 mg by mouth 3 (three) times a week. 02/12/21  Yes Shirley Friar, PA-C  ?ibuprofen (ADVIL) 200 MG tablet Take 400 mg by mouth every 6 (six) hours as needed.   Yes [provider]  ?isosorbide dinitrate (ISORDIL) 20 MG tablet Take 20 mg by mouth 3 (three) times daily. 05/14/21  Yes [provider]  ?isosorbide-hydrALAZINE (BIDIL) 20-37.5 MG tablet TAKE 1 TABLET BY MOUTH THREE TIMES DAILY ?Patient taking differently: Take 1 tablet by mouth daily. 04/16/21  Yes Charlott Rakes, MD  ?losartan (COZAAR) 100 MG tablet Take 1 tablet (100 mg total) by mouth daily. 03/15/19  Yes Bensimhon, Shaune Pascal, MD  ?nitroGLYCERIN (  NITROSTAT) 0.4 MG SL tablet Place 1 tablet (0.4 mg total) under the tongue every 5 (five) minutes x 3 doses as needed for chest pain. 03/22/18  Yes Baldwin Jamaica, PA-C  ?PARoxetine (PAXIL) 20 MG tablet Take 20 mg by mouth daily.   Yes [provider]  ?potassium chloride SA (KLOR-CON) 20 MEQ tablet Take 3 tablets (60 mEq total) by mouth daily. ?Patient taking differently: Take 40 mEq by mouth daily. 11/05/20  Yes Shirley Friar, PA-C  ?sodium chloride (OCEAN) 0.65 % SOLN nasal spray Place 1 spray into both nostrils daily as needed for congestion.   Yes [provider]  ?spironolactone (ALDACTONE) 25 MG tablet Take 1  tablet (25 mg total) by mouth daily. ?Patient taking differently: Take 25 mg by mouth daily as needed (fluid). 12/08/19  Yes Shirley Friar, PA-C  ?traZODone (DESYREL) 150 MG tablet TAKE 1 TABLET(150 MG) BY MOUTH AT BEDTIME ?Patient taking differently: Take 150 mg by mouth at bedtime. 03/14/21  Yes Gildardo Pounds, NP  ?amiodarone (PACERONE) 200 MG tablet Take 1 tablet (200 mg total) by mouth daily. 05/17/21   Tedd Sias, PA  ?pantoprazole (PROTONIX) 40 MG tablet Take 1 tablet (40 mg total) by mouth 2 (two) times daily. 05/17/21   Tedd Sias, PA  ?   ? ?Allergies    ?Lisinopril, Allopurinol, Apple juice, Entresto [sacubitril-valsartan], Other, and Shellfish allergy   ? ?Review of Systems   ?Review of Systems  ?Cardiovascular:  Positive for chest pain.  ? ?Physical Exam ?Updated Vital Signs ?BP 109/66   Pulse (!) 104   Temp (S) 98.7 ?F (37.1 ?C) (Rectal)   Resp 13   SpO2 100%  ?Physical Exam ?Vitals and nursing note reviewed.  ?Constitutional:   ?   General: He is not in acute distress. ?HENT:  ?   Head: Normocephalic and atraumatic.  ?   Nose: Nose normal.  ?   Mouth/Throat:  ?   Mouth: Mucous membranes are moist.  ?Eyes:  ?   General: No scleral icterus. ?Cardiovascular:  ?   Rate and Rhythm: Normal rate and regular rhythm.  ?   Pulses: Normal pulses.  ?   Heart sounds: Normal heart sounds.  ?Pulmonary:  ?   Effort: Pulmonary effort is normal. No respiratory distress.  ?   Breath sounds: No wheezing.  ?Abdominal:  ?   Palpations: Abdomen is soft.  ?   Tenderness: There is no abdominal tenderness.  ?Musculoskeletal:  ?   Cervical back: Normal range of motion.  ?   Right lower leg: No edema.  ?   Left lower leg: No edema.  ?Skin: ?   General: Skin is warm and dry.  ?   Capillary Refill: Capillary refill takes less than 2 seconds.  ?Neurological:  ?   Mental Status: He is alert. Mental status is at baseline.  ?Psychiatric:     ?   Mood and Affect: Mood normal.     ?   Behavior: Behavior normal.   ? ? ?ED Results / Procedures / Treatments   ?Labs ?(all labs ordered are listed, but only abnormal results are displayed) ?Labs Reviewed  ?BASIC METABOLIC PANEL - Abnormal; Notable for the following components:  ?    Result Value  ? Potassium 2.6 (*)   ? Glucose, Bld 130 (*)   ? BUN 76 (*)   ? Creatinine, Ser 1.44 (*)   ? GFR, Estimated 57 (*)   ? All other components  within normal limits  ?CBC - Abnormal; Notable for the following components:  ? WBC 18.4 (*)   ? RBC 3.49 (*)   ? Hemoglobin 11.7 (*)   ? HCT 34.5 (*)   ? All other components within normal limits  ?POC OCCULT BLOOD, ED - Abnormal; Notable for the following components:  ? Fecal Occult Bld POSITIVE (*)   ? All other components within normal limits  ?RESP PANEL BY RT-PCR (FLU A&B, COVID) ARPGX2  ?HEPATIC FUNCTION PANEL  ?RAPID URINE DRUG SCREEN, HOSP PERFORMED  ?ETHANOL  ?PROTIME-INR  ?MAGNESIUM  ?BASIC METABOLIC PANEL  ?TROPONIN I (HIGH SENSITIVITY)  ?TROPONIN I (HIGH SENSITIVITY)  ? ? ?EKG ?EKG Interpretation ? ?Date/Time:  Saturday May 17 2021 10:13:28 EDT ?Ventricular Rate:  121 ?PR Interval:  128 ?QRS Duration: 134 ?QT Interval:  380 ?QTC Calculation: 539 ?R Axis:   -31 ?Text Interpretation: Interpretation limited secondary to artifact When compared with ECG of 15-Mar-2019 12:16, PREVIOUS ECG IS PRESENT Questionable paced Confirmed by Fredia Sorrow [phone]) on 05/17/2021 10:56:00 AM ? ?Radiology ?DG Chest 2 View ? ?Result Date: 05/17/2021 ?CLINICAL DATA:  Chest pain. EXAM: CHEST - 2 VIEW COMPARISON:  Chest radiographs and CTA 12/28/2018 FINDINGS: An ICD remains in place. The cardiac silhouette is upper limits of normal in size. The lungs are well inflated. No airspace consolidation, edema, pleural effusion, or pneumothorax is identified. A neural stimulator has been placed with lead coursing into the right neck. No acute osseous abnormality is seen. IMPRESSION: No active cardiopulmonary disease. Electronically Signed   By: Logan Bores M.D.   On:  05/17/2021 11:03   ? ?Procedures ?Marland KitchenCritical Care ?Performed by: Tedd Sias, PA ?Authorized by: Tedd Sias, PA  ? ?Critical care provider statement:  ?  Critical care time (minutes):  35 ?  Criti

## 2021-05-17 NOTE — ED Provider Triage Note (Signed)
Emergency Medicine Provider Triage Evaluation Note ? ?Stanley [name] , a 56 y.o. male  was evaluated in triage.  Pt complains of having a bowel movement with blood in it.  He says this has been ongoing for four days.  He got short of breath with this as like his shortness of breath is worse.  He was at [facility] earlier today and discharged. He reports that this is the same pain as when he had the colonoscopy 03/06/21.  ? ?Chart review shows that he did not have a digoxin level checked.  I have ordered this. ? ?With his shortness of breath being worse than it was earlier per his report we will additionally repeat chest x-ray. ?Additionally as he had abnormal electrolytes we will recheck those and with his report of ongoing bright red blood per rectum we will recheck CBC. ? ? ? ?Physical Exam  ?BP 94/66 (BP Location: Left Arm)   Pulse 94   Temp 99.6 ?F (37.6 ?C) (Oral)   Resp 20   Ht '5\' 5"'$  (1.651 m)   Wt 69.6 kg   SpO2 100%   BMI 25.53 kg/m?  ?Gen:   Awake, no distress   ?Resp:  Normal effort  ?MSK:   Moves extremities without difficulty  ?Other:  Normal speech. ? ?Medical Decision Making  ?Medically screening exam initiated at 10:35 PM.  Appropriate orders placed.  Stanley [name] was informed that the remainder of the evaluation will be completed by another provider, this initial triage assessment does not replace that evaluation, and the importance of remaining in the ED until their evaluation is complete. ? ? ?  ?Lorin Glass, PA-C ?05/17/21 2243 ? ?

## 2021-05-18 ENCOUNTER — Inpatient Hospital Stay (HOSPITAL_COMMUNITY): Payer: Medicare Other | Admitting: Anesthesiology

## 2021-05-18 ENCOUNTER — Encounter (HOSPITAL_COMMUNITY): Payer: Self-pay | Admitting: Internal Medicine

## 2021-05-18 ENCOUNTER — Encounter (HOSPITAL_COMMUNITY)
Admission: EM | Disposition: A | Discharge status: Home / Self Care | Payer: Self-pay | Source: Home / Self Care | Attending: Internal Medicine

## 2021-05-18 DIAGNOSIS — D62 Acute posthemorrhagic anemia: Secondary | ICD-10-CM | POA: Diagnosis present

## 2021-05-18 DIAGNOSIS — I5022 Chronic systolic (congestive) heart failure: Secondary | ICD-10-CM | POA: Diagnosis present

## 2021-05-18 DIAGNOSIS — I428 Other cardiomyopathies: Secondary | ICD-10-CM | POA: Diagnosis present

## 2021-05-18 DIAGNOSIS — M109 Gout, unspecified: Secondary | ICD-10-CM | POA: Diagnosis present

## 2021-05-18 DIAGNOSIS — K222 Esophageal obstruction: Secondary | ICD-10-CM

## 2021-05-18 DIAGNOSIS — K922 Gastrointestinal hemorrhage, unspecified: Secondary | ICD-10-CM

## 2021-05-18 DIAGNOSIS — K449 Diaphragmatic hernia without obstruction or gangrene: Secondary | ICD-10-CM | POA: Diagnosis not present

## 2021-05-18 DIAGNOSIS — N179 Acute kidney failure, unspecified: Secondary | ICD-10-CM

## 2021-05-18 DIAGNOSIS — K920 Hematemesis: Secondary | ICD-10-CM | POA: Diagnosis present

## 2021-05-18 DIAGNOSIS — Z7141 Alcohol abuse counseling and surveillance of alcoholic: Secondary | ICD-10-CM | POA: Diagnosis not present

## 2021-05-18 DIAGNOSIS — Z8249 Family history of ischemic heart disease and other diseases of the circulatory system: Secondary | ICD-10-CM | POA: Diagnosis not present

## 2021-05-18 DIAGNOSIS — G4730 Sleep apnea, unspecified: Secondary | ICD-10-CM | POA: Diagnosis present

## 2021-05-18 DIAGNOSIS — K921 Melena: Secondary | ICD-10-CM

## 2021-05-18 DIAGNOSIS — Z803 Family history of malignant neoplasm of breast: Secondary | ICD-10-CM | POA: Diagnosis not present

## 2021-05-18 DIAGNOSIS — K5730 Diverticulosis of large intestine without perforation or abscess without bleeding: Secondary | ICD-10-CM | POA: Diagnosis not present

## 2021-05-18 DIAGNOSIS — R1013 Epigastric pain: Secondary | ICD-10-CM | POA: Diagnosis not present

## 2021-05-18 DIAGNOSIS — E785 Hyperlipidemia, unspecified: Secondary | ICD-10-CM | POA: Diagnosis present

## 2021-05-18 DIAGNOSIS — K5731 Diverticulosis of large intestine without perforation or abscess with bleeding: Secondary | ICD-10-CM | POA: Diagnosis present

## 2021-05-18 DIAGNOSIS — I110 Hypertensive heart disease with heart failure: Secondary | ICD-10-CM | POA: Diagnosis present

## 2021-05-18 DIAGNOSIS — I10 Essential (primary) hypertension: Secondary | ICD-10-CM

## 2021-05-18 DIAGNOSIS — Z91018 Allergy to other foods: Secondary | ICD-10-CM | POA: Diagnosis not present

## 2021-05-18 DIAGNOSIS — Z79899 Other long term (current) drug therapy: Secondary | ICD-10-CM | POA: Diagnosis not present

## 2021-05-18 DIAGNOSIS — Y900 Blood alcohol level of less than 20 mg/100 ml: Secondary | ICD-10-CM | POA: Diagnosis present

## 2021-05-18 DIAGNOSIS — I2510 Atherosclerotic heart disease of native coronary artery without angina pectoris: Secondary | ICD-10-CM | POA: Diagnosis present

## 2021-05-18 DIAGNOSIS — F1010 Alcohol abuse, uncomplicated: Secondary | ICD-10-CM

## 2021-05-18 DIAGNOSIS — Z888 Allergy status to other drugs, medicaments and biological substances status: Secondary | ICD-10-CM | POA: Diagnosis not present

## 2021-05-18 DIAGNOSIS — Z20822 Contact with and (suspected) exposure to covid-19: Secondary | ICD-10-CM | POA: Diagnosis present

## 2021-05-18 DIAGNOSIS — F32A Depression, unspecified: Secondary | ICD-10-CM | POA: Diagnosis present

## 2021-05-18 DIAGNOSIS — Z800 Family history of malignant neoplasm of digestive organs: Secondary | ICD-10-CM | POA: Diagnosis not present

## 2021-05-18 DIAGNOSIS — Z91013 Allergy to seafood: Secondary | ICD-10-CM | POA: Diagnosis not present

## 2021-05-18 DIAGNOSIS — I252 Old myocardial infarction: Secondary | ICD-10-CM | POA: Diagnosis not present

## 2021-05-18 DIAGNOSIS — K259 Gastric ulcer, unspecified as acute or chronic, without hemorrhage or perforation: Secondary | ICD-10-CM | POA: Diagnosis not present

## 2021-05-18 DIAGNOSIS — Y92009 Unspecified place in unspecified non-institutional (private) residence as the place of occurrence of the external cause: Secondary | ICD-10-CM | POA: Diagnosis not present

## 2021-05-18 DIAGNOSIS — K254 Chronic or unspecified gastric ulcer with hemorrhage: Secondary | ICD-10-CM | POA: Diagnosis present

## 2021-05-18 HISTORY — PX: ESOPHAGOGASTRODUODENOSCOPY (EGD) WITH PROPOFOL: SHX5813

## 2021-05-18 LAB — VITAMIN B12: Vitamin B-12: 304 pg/mL (ref 180–914)

## 2021-05-18 LAB — RETICULOCYTES
Immature Retic Fract: 33.9 % — ABNORMAL HIGH (ref 2.3–15.9)
RBC.: 1.93 MIL/uL — ABNORMAL LOW (ref 4.22–5.81)
Retic Count, Absolute: 79.5 10*3/uL (ref 19.0–186.0)
Retic Ct Pct: 4.1 % — ABNORMAL HIGH (ref 0.4–3.1)

## 2021-05-18 LAB — IRON AND TIBC
Iron: 71 ug/dL (ref 45–182)
Saturation Ratios: 26 % (ref 17.9–39.5)
TIBC: 274 ug/dL (ref 250–450)
UIBC: 203 ug/dL

## 2021-05-18 LAB — HIV ANTIBODY (ROUTINE TESTING W REFLEX): HIV Screen 4th Generation wRfx: NONREACTIVE

## 2021-05-18 LAB — FERRITIN: Ferritin: 327 ng/mL (ref 24–336)

## 2021-05-18 LAB — TROPONIN I (HIGH SENSITIVITY): Troponin I (High Sensitivity): 16 ng/L (ref <18)

## 2021-05-18 LAB — PREPARE RBC (CROSSMATCH)

## 2021-05-18 LAB — HEMOGLOBIN AND HEMATOCRIT, BLOOD
HCT: 23.2 % — ABNORMAL LOW (ref 39.0–52.0)
HCT: 21 % — ABNORMAL LOW (ref 39.0–52.0)
Hemoglobin: 7.6 g/dL — ABNORMAL LOW (ref 13.0–17.0)
Hemoglobin: 6.9 g/dL — CL (ref 13.0–17.0)

## 2021-05-18 LAB — FOLATE: Folate: 19.3 ng/mL (ref >5.9)

## 2021-05-18 LAB — ABO/RH: ABO/RH(D): O POS

## 2021-05-18 LAB — DIGOXIN LEVEL: Digoxin Level: 0.6 ng/mL — ABNORMAL LOW (ref 0.8–2.0)

## 2021-05-18 SURGERY — ESOPHAGOGASTRODUODENOSCOPY (EGD) WITH PROPOFOL
Anesthesia: Monitor Anesthesia Care

## 2021-05-18 MED ORDER — THIAMINE HCL 100 MG PO TABS
100.0000 mg | ORAL_TABLET | Freq: Every day | ORAL | Status: DC
  Administered 2021-05-18 – 2021-05-20 (×3): 100 mg via ORAL
  Filled 2021-05-18 (×3): qty 1

## 2021-05-18 MED ORDER — ATORVASTATIN CALCIUM 10 MG PO TABS
20.0000 mg | ORAL_TABLET | Freq: Every day | ORAL | Status: DC
  Administered 2021-05-18 – 2021-05-20 (×3): 20 mg via ORAL
  Filled 2021-05-18 (×3): qty 2

## 2021-05-18 MED ORDER — POTASSIUM CHLORIDE 10 MEQ/100ML IV SOLN
10.0000 meq | INTRAVENOUS | Status: AC
  Administered 2021-05-18 (×3): 10 meq via INTRAVENOUS
  Filled 2021-05-18 (×3): qty 100

## 2021-05-18 MED ORDER — PANTOPRAZOLE INFUSION (NEW) - SIMPLE MED
8.0000 mg/h | INTRAVENOUS | Status: DC
  Administered 2021-05-18: 8 mg/h via INTRAVENOUS
  Filled 2021-05-18: qty 100
  Filled 2021-05-18: qty 80

## 2021-05-18 MED ORDER — ACETAMINOPHEN 650 MG RE SUPP: 650.0000 mg | Freq: Four times a day (QID) | RECTAL | Status: DC | PRN

## 2021-05-18 MED ORDER — FOLIC ACID 1 MG PO TABS
1.0000 mg | ORAL_TABLET | Freq: Every day | ORAL | Status: DC
  Administered 2021-05-18 – 2021-05-20 (×3): 1 mg via ORAL
  Filled 2021-05-18 (×3): qty 1

## 2021-05-18 MED ORDER — BOOST / RESOURCE BREEZE PO LIQD CUSTOM
1.0000 | Freq: Three times a day (TID) | ORAL | Status: DC
  Administered 2021-05-18 – 2021-05-20 (×4): 1 via ORAL

## 2021-05-18 MED ORDER — PANTOPRAZOLE SODIUM 40 MG PO TBEC: 40.0000 mg | DELAYED_RELEASE_TABLET | Freq: Two times a day (BID) | ORAL | Status: DC

## 2021-05-18 MED ORDER — ACETAMINOPHEN 325 MG PO TABS
650.0000 mg | ORAL_TABLET | Freq: Four times a day (QID) | ORAL | Status: DC | PRN
  Administered 2021-05-18 – 2021-05-19 (×2): 650 mg via ORAL
  Filled 2021-05-18 (×3): qty 2

## 2021-05-18 MED ORDER — LORAZEPAM 2 MG/ML IJ SOLN: 1.0000 mg | INTRAMUSCULAR | Status: DC | PRN

## 2021-05-18 MED ORDER — TRAZODONE HCL 50 MG PO TABS
150.0000 mg | ORAL_TABLET | Freq: Every day | ORAL | Status: DC
  Administered 2021-05-18 – 2021-05-19 (×2): 150 mg via ORAL
  Filled 2021-05-18 (×2): qty 1

## 2021-05-18 MED ORDER — LORAZEPAM 1 MG PO TABS: 1.0000 mg | ORAL_TABLET | ORAL | Status: DC | PRN

## 2021-05-18 MED ORDER — PROPOFOL 10 MG/ML IV BOLUS
INTRAVENOUS | Status: DC | PRN
  Administered 2021-05-18 (×5): 20 mg via INTRAVENOUS

## 2021-05-18 MED ORDER — PHENYLEPHRINE HCL-NACL 20-0.9 MG/250ML-% IV SOLN
INTRAVENOUS | Status: DC | PRN
Start: 2021-05-18 — End: 2021-05-18
  Administered 2021-05-18: 25 ug/min via INTRAVENOUS

## 2021-05-18 MED ORDER — THIAMINE HCL 100 MG/ML IJ SOLN
100.0000 mg | Freq: Every day | INTRAMUSCULAR | Status: DC
  Filled 2021-05-18: qty 2

## 2021-05-18 MED ORDER — ONDANSETRON HCL 4 MG/2ML IJ SOLN
4.0000 mg | Freq: Once | INTRAMUSCULAR | Status: AC
  Administered 2021-05-18: 4 mg via INTRAVENOUS
  Filled 2021-05-18: qty 2

## 2021-05-18 MED ORDER — PROPOFOL 500 MG/50ML IV EMUL
INTRAVENOUS | Status: DC | PRN
Start: 2021-05-18 — End: 2021-05-18

## 2021-05-18 MED ORDER — PANTOPRAZOLE 80MG IVPB - SIMPLE MED
80.0000 mg | Freq: Once | INTRAVENOUS | Status: AC
Start: 2021-05-18 — End: 2021-05-18
  Administered 2021-05-18: 80 mg via INTRAVENOUS
  Filled 2021-05-18: qty 80

## 2021-05-18 MED ORDER — ADULT MULTIVITAMIN W/MINERALS CH
1.0000 | ORAL_TABLET | Freq: Every day | ORAL | Status: DC
  Administered 2021-05-18 – 2021-05-20 (×3): 1 via ORAL
  Filled 2021-05-18 (×3): qty 1

## 2021-05-18 MED ORDER — ONDANSETRON HCL 4 MG PO TABS: 4.0000 mg | ORAL_TABLET | Freq: Four times a day (QID) | ORAL | Status: DC | PRN

## 2021-05-18 MED ORDER — AMIODARONE HCL 200 MG PO TABS
200.0000 mg | ORAL_TABLET | Freq: Every day | ORAL | Status: DC
  Administered 2021-05-18 – 2021-05-20 (×3): 200 mg via ORAL
  Filled 2021-05-18 (×3): qty 1

## 2021-05-18 MED ORDER — PANTOPRAZOLE SODIUM 40 MG IV SOLR: 40.0000 mg | Freq: Two times a day (BID) | INTRAVENOUS | Status: DC

## 2021-05-18 MED ORDER — MORPHINE SULFATE (PF) 4 MG/ML IV SOLN
4.0000 mg | Freq: Once | INTRAVENOUS | Status: AC
  Administered 2021-05-18: 4 mg via INTRAVENOUS
  Filled 2021-05-18: qty 1

## 2021-05-18 MED ORDER — ALLOPURINOL 100 MG PO TABS
100.0000 mg | ORAL_TABLET | Freq: Every day | ORAL | Status: DC
  Administered 2021-05-18 – 2021-05-20 (×3): 100 mg via ORAL
  Filled 2021-05-18 (×3): qty 1

## 2021-05-18 MED ORDER — ONDANSETRON HCL 4 MG/2ML IJ SOLN
4.0000 mg | Freq: Four times a day (QID) | INTRAMUSCULAR | Status: DC | PRN
Start: 2021-05-18 — End: 2021-05-20

## 2021-05-18 MED ORDER — LACTATED RINGERS IV SOLN: INTRAVENOUS | Status: DC

## 2021-05-18 MED ORDER — DIGOXIN 125 MCG PO TABS
0.0625 mg | ORAL_TABLET | Freq: Every day | ORAL | Status: DC
  Administered 2021-05-18 – 2021-05-20 (×3): 0.0625 mg via ORAL
  Filled 2021-05-18 (×3): qty 1

## 2021-05-18 MED ORDER — PROPOFOL 500 MG/50ML IV EMUL
INTRAVENOUS | Status: DC | PRN
  Administered 2021-05-18: 75 ug/kg/min via INTRAVENOUS

## 2021-05-18 MED ORDER — PHENYLEPHRINE 80 MCG/ML (10ML) SYRINGE FOR IV PUSH (FOR BLOOD PRESSURE SUPPORT)
PREFILLED_SYRINGE | INTRAVENOUS | Status: DC | PRN
  Administered 2021-05-18: 160 ug via INTRAVENOUS
  Administered 2021-05-18 (×4): 80 ug via INTRAVENOUS

## 2021-05-18 MED ORDER — SODIUM CHLORIDE 0.9% IV SOLUTION: Freq: Once | INTRAVENOUS | Status: AC

## 2021-05-18 SURGICAL SUPPLY — 15 items

## 2021-05-18 NOTE — Assessment & Plan Note (Signed)
Holding most BP meds: see CHF discussion above. ?

## 2021-05-18 NOTE — TOC Initial Note (Signed)
Transition of Care (TOC) - Initial/Assessment Note  ? ? ?Patient Details  ?Name: Stanley [name] ?MRN: [id number] ?Date of Birth: [birth year] ? ?Transition of Care (TOC) CM/SW Contact:    ?Verdell Carmine, RN ?Phone Number: ?05/18/2021, 12:16 PM ? ?Clinical Narrative:                 ? ?Transition of Care Department [facility]) has reviewed patient and no TOC needs have been identified at this time. We will continue to monitor patient advancement through interdisciplinary progression rounds. If new patient transition needs arise, please place a TOC consult. ?  ? SA resources added to patient instructions  ?  ?  ? ? ?Patient Goals and CMS Choice ?  ?  ?  ? ?Expected Discharge Plan and Services ?  ?  ?  ?  ?  ?                ?  ?  ?  ?  ?  ?  ?  ?  ?  ?  ? ?Prior Living Arrangements/Services ?  ?  ?  ?       ?  ?  ?  ?  ? ?Activities of Daily Living ?  ?  ? ?Permission Sought/Granted ?  ?  ?   ?   ?   ?   ? ?Emotional Assessment ?  ?  ?  ?  ?  ?  ? ?Admission diagnosis:  Hematemesis [K92.0] ?Patient Active Problem List  ? Diagnosis Date Noted  ? Hematemesis 05/18/2021  ? ABLA (acute blood loss anemia) 05/18/2021  ? Benign neoplasm of colon   ? Abdominal pain, epigastric   ? Early satiety   ? Dysphagia   ? Gastroesophageal reflux disease 04/23/2020  ? Colon cancer screening 04/23/2020  ? CAD (coronary artery disease) 07/07/2019  ? Congestive heart failure (CHF) ([place]) 07/07/2019  ? Moderate episode of recurrent major depressive disorder ([place]) 01/11/2019  ? Chronic systolic heart failure ([place])   ? Obstructive sleep apnea 08/11/2018  ? 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 ETOH abuse 03/21/2018  ? AKI (acute kidney injury) ([place]) 03/21/2018  ? History of noncompliance with medical treatment, presenting hazards to health 03/21/2018  ? Ventricular tachycardia ([place]) 03/21/2018  ? Gout 09/25/2015  ? Nonischemic cardiomyopathy ([place]) 07/03/2015  ? Chronic systolic dysfunction of left ventricle 07/03/2015   ? Essential hypertension 07/03/2015  ? ?PCP:  Gildardo Pounds, NP ?Pharmacy:   ?[id number] [address] [place] [place] [address] ?[address] ?[place] [address] ?Phone: [phone] Fax: [phone] ? ?[facility] ?[address] ?[place] [address] ?Phone: [phone] Fax: [phone] ? ?[place] ?[place] [address] ?Phone: [phone] Fax: [phone] ? ?[place] ?[place] [address] ?Phone: [phone] Fax: [phone] ? ? ? ? ?Social Determinants of Health (SDOH) Interventions ?  ? ?Readmission Risk Interventions ?   ? View : No data to display.  ?  ?  ?  ? ? ? ?

## 2021-05-18 NOTE — H&P (View-Only) (Signed)
? ? ?Consultation ? ?Referring Provider:  [facility]/ [place] ?Primary Care Physician:  Gildardo Pounds, NP ?Primary Gastroenterologist:  Dr. Havery Moros ? ?Reason for Consultation: GI bleed, melena, hematemesis ? ?HPI: Stanley [name] is a 56 y.o. male, known to Dr. Havery Moros, who underwent very recent endoscopic evaluation, for colon screening, and complaints of GERD and dysphagia. ?He had colonoscopy in March 2023 with removal of 210 mm polyps and was noted to have a few diverticuli and internal hemorrhoids, polyps were tubular adenomas.  EGD at that same setting with finding of a 1 cm hiatal hernia, he underwent empiric dilation to 17 mm was noted to have patchy gastritis, biopsies were taken and showed reactive gastropathy no H. pylori otherwise negative exam. ? ?Since his his current symptoms started on Tuesday, 05/13/2021 with a tarry black bowel movement, followed later that day with nausea and vomiting at which point he got up straining dark.  The following day he had another black tarry stool and continued to have some episodes of dark blood with emesis.  He is also complaining of upper abdominal cramping and pain.  By the following day he was having some dizziness and shortness of breath with ambulation.  He presented to the emergency room, finally yesterday with complaints of shortness of breath, dizziness etc.  Labs were stable, hemoglobin 11.7, heme positive and elevated BUN, troponins were negative.  Decision was made to discharge him home with cardiology follow-up. ?He says when he was getting out of the car to go back into his house he felt dizzy and short of breath and his sister told him they were going back to the emergency room.  On arrival here with initial labs hemoglobin down to 8.8/hematocrit 26/WBC 20.3, BUN 76/creatinine 1.44 ?INR 1.1 ?LFTs within normal limits ? ?This morning hemoglobin down to 7.6/23.3 hematocrit ? ?Patient has been hemodynamically stable, has been n.p.o., on IV PPI  infusion. ?He has not had any further melena though feels as if he needs to have a bowel movement this morning, no further nausea or vomiting or hematemesis. ? ?On further questioning he has been taking 1800 mg of ibuprofen at least every other day for recent gout flare and has been doing this for many weeks.  He also has been drinking on a daily basis. ?Eitel signs are stable this morning, he is comfortable lying down has no complaints of chest pain or shortness of breath and is satting well.131/72 ?123/65 pulse in the 70s ? ?Patient does have history of nonischemic cardiomyopathy/congestive heart failure with EF of about 34%, coronary artery disease status post remote PCI, he is status post ICD, history of V. Tach. ? ?No current antiplatelet therapy ? ?Past Medical History:  ?Diagnosis Date  ? Acute renal failure (ARF) (HCC)   ? Acute respiratory failure with hypoxia ([place]) 12/29/2018  ? Alcohol abuse   ? Anxiety   ? CAD (coronary artery disease)   ? a. dx unclear, reported PCI in 2011 but cath 2014 normal coronaries and cardiac CT 07/2016 normal coronaries, no mention of stents.  ? Chronic chest pain   ? Chronic systolic CHF (congestive heart failure) ([place])   ? Depression   ? Dyspnea   ? 07/05/2019: per patient some times with exertion, "has to catch breath"  ? Financial difficulties   ? Gout   ? Gouty arthritis   ? Headache   ? History of noncompliance with medical treatment   ? financial challenges  ? Hypertension   ? ICD (implantable cardioverter-defibrillator)  in place   ? [employer] ICD implant done in [place] 2015  ? Insomnia   ? Lobar pneumonia ([place]) 12/28/2018  ? Myocardial infarction [facility]) 2005  ? Nonischemic cardiomyopathy ([place])   ? Sleep apnea   ? per patient, has CPAP does not wear it every night  ? Ventricular tachycardia ([place]) 01/2015  ? 2017 - ATP delivered for sinus tach, degenerated into VT for which he received shock. Recurrent VT 03/2018.  ? ? ?Past Surgical History:  ?Procedure Laterality Date  ?  BAROREFLEX SYSTEM INSERTION    ? BIOPSY  03/06/2021  ? Procedure: BIOPSY;  Surgeon: Yetta Flock, MD;  Location: [facility];  Service: Gastroenterology;;  ? CARDIAC DEFIBRILLATOR PLACEMENT  06/05/2013  ? [employer] ICD implanted at [employer] in [place] for primary prevention  ? COLONOSCOPY WITH PROPOFOL N/A 03/06/2021  ? Procedure: COLONOSCOPY WITH PROPOFOL;  Surgeon: Yetta Flock, MD;  Location: [facility];  Service: Gastroenterology;  Laterality: N/A;  ? ESOPHAGEAL DILATION  03/06/2021  ? Procedure: ESOPHAGEAL DILATION;  Surgeon: Yetta Flock, MD;  Location: [facility];  Service: Gastroenterology;;  ? ESOPHAGOGASTRODUODENOSCOPY (EGD) WITH PROPOFOL N/A 03/06/2021  ? Procedure: ESOPHAGOGASTRODUODENOSCOPY (EGD) WITH PROPOFOL;  Surgeon: Yetta Flock, MD;  Location: [facility];  Service: Gastroenterology;  Laterality: N/A;  ? HERNIA REPAIR    ? PERCUTANEOUS CORONARY STENT INTERVENTION (PCI-S)  01/05/2009  ? POLYPECTOMY  03/06/2021  ? Procedure: POLYPECTOMY;  Surgeon: Yetta Flock, MD;  Location: [facility];  Service: Gastroenterology;;  ? ? ?Prior to Admission medications   ?Medication Sig Start Date End Date Taking? Authorizing Provider  ?allopurinol (ZYLOPRIM) 100 MG tablet Take 1 tablet (100 mg total) by mouth daily. FOR GOUT 10/03/20  Yes Gildardo Pounds, NP  ?amiodarone (PACERONE) 200 MG tablet Take 1 tablet (200 mg total) by mouth daily. 05/17/21  Yes Tedd Sias, PA  ?Ascorbic Acid (VITAMIN C) 1000 MG tablet Take 1,000 mg by mouth daily.   Yes [provider]  ?atorvastatin (LIPITOR) 20 MG tablet Take 1 tablet (20 mg total) by mouth daily. 02/19/20  Yes Gildardo Pounds, NP  ?carvedilol (COREG) 25 MG tablet TAKE 1 TABLET(25 MG) BY MOUTH TWICE DAILY WITH A MEAL ?Patient taking differently: Take 25 mg by mouth 2 (two) times daily with a meal. 02/28/21  Yes Gildardo Pounds, NP  ?digoxin (LANOXIN) 0.125 MG tablet Take 0.5 tablets (0.0625  mg total) by mouth daily. 02/12/21  Yes Shirley Friar, PA-C  ?furosemide (LASIX) 80 MG tablet TAKE 1 TABLET BY MOUTH ON MONDAY, WEDNESDAY& FRIDAY. MAY TAKE 1 TABLET AS NEEDED FOR SWELLING ?Patient taking differently: Take 80 mg by mouth every Monday, Wednesday, and Friday. 02/12/21  Yes Shirley Friar, PA-C  ?ibuprofen (ADVIL) 200 MG tablet Take 400 mg by mouth every 6 (six) hours as needed for headache or moderate pain.   Yes [provider]  ?isosorbide dinitrate (ISORDIL) 20 MG tablet Take 20 mg by mouth 3 (three) times daily. 05/14/21  Yes [provider]  ?isosorbide-hydrALAZINE (BIDIL) 20-37.5 MG tablet TAKE 1 TABLET BY MOUTH THREE TIMES DAILY ?Patient taking differently: Take 1 tablet by mouth daily. 04/16/21  Yes Charlott Rakes, MD  ?losartan (COZAAR) 100 MG tablet Take 1 tablet (100 mg total) by mouth daily. 03/15/19  Yes Bensimhon, Shaune Pascal, MD  ?nitroGLYCERIN (NITROSTAT) 0.4 MG SL tablet Place 1 tablet (0.4 mg total) under the tongue every 5 (five) minutes x 3 doses as needed  for chest pain. 03/22/18  Yes Baldwin Jamaica, PA-C  ?pantoprazole (PROTONIX) 40 MG tablet Take 1 tablet (40 mg total) by mouth 2 (two) times daily. 05/17/21  Yes [place], Ova [place] S, PA  ?potassium chloride SA (KLOR-CON) 20 MEQ tablet Take 3 tablets (60 mEq total) by mouth daily. ?Patient taking differently: Take 40 mEq by mouth daily. 11/05/20  Yes Shirley Friar, PA-C  ?sodium chloride (OCEAN) 0.65 % SOLN nasal spray Place 1 spray into both nostrils daily as needed for congestion.   Yes [provider]  ?spironolactone (ALDACTONE) 25 MG tablet Take 1 tablet (25 mg total) by mouth daily. ?Patient taking differently: Take 25 mg by mouth daily as needed (fluid). 12/08/19  Yes Shirley Friar, PA-C  ?traZODone (DESYREL) 150 MG tablet TAKE 1 TABLET(150 MG) BY MOUTH AT BEDTIME ?Patient taking differently: Take 150 mg by mouth at bedtime. 03/14/21  Yes Gildardo Pounds, NP  ? ? ?Current  Facility-Administered Medications  ?Medication Dose Route Frequency Provider Last Rate Last Admin  ? acetaminophen (TYLENOL) tablet 650 mg  650 mg Oral Q6H PRN Etta Quill, DO      ? Or  ? acetaminophen (TYLEN

## 2021-05-18 NOTE — Assessment & Plan Note (Addendum)
With AICD, h/o VT. ?1. Hold lasix ?2. Hold aldactone ?3. Hold losartan ?4. Continue amiodarone and digoxin ?5. Holding coreg, bidil and isordil since it looks like he hasnt taken these in 3 days and BP 120/78 right now ?1. May need to restart depending on what BP does today. ?6. Looks like he is majorly overdue for cards office visit having missed visits for past 2 years, most recently missed visit in Jan from what I can tell? ?1. Dr. B has a note in chart from Jan but that note says he no showed for the office visit at the top. ?7. Watch for exacerbation with IVF ?

## 2021-05-18 NOTE — Anesthesia Preprocedure Evaluation (Addendum)
Anesthesia Evaluation  ?Patient identified by MRN, date of birth, ID band ?Patient awake ? ? ? ?Reviewed: ?Allergy & Precautions, NPO status , Patient's Chart, lab work & pertinent test results ? ?Airway ?Mallampati: II ? ?TM Distance: >3 FB ?Neck ROM: Full ? ? ? Dental ? ?(+) Missing, Dental Advisory Given ?  ?Pulmonary ?sleep apnea and Continuous Positive Airway Pressure Ventilation ,  ?  ?Pulmonary exam normal ? ? ? ? ? ? ? Cardiovascular ?hypertension, Pt. on home beta blockers and Pt. on medications ?+ angina + CAD, + Past MI, + Cardiac Stents and +CHF  ?Normal cardiovascular exam+ Cardiac Defibrillator ? ? ?The left ventricular ejection fraction is moderately decreased (30-44%). ?Nuclear stress EF: 34%. ?No T wave inversion was noted during stress. ?There was no ST segment deviation noted during stress. ?Defect 1: There is a medium defect of moderate severity present in the mid inferior, apical inferior and apex location. ?This is a high risk study. ?Findings consistent with prior myocardial infarction. ? ?  ?Neuro/Psych ? Headaches, PSYCHIATRIC DISORDERS Anxiety Depression   ? GI/Hepatic ?GERD  Medicated and Controlled,(+)  ?  ? substance abuse ? ,   ?Endo/Other  ?negative endocrine ROS ? Renal/GU ?Renal disease  ? ?  ?Musculoskeletal ? ?(+) Arthritis , Gout  ? Abdominal ?  ?Peds ? Hematology ? ?(+) Blood dyscrasia, anemia ,   ?Anesthesia Other Findings ?GI bleed ? Reproductive/Obstetrics ? ?  ? ? ? ? ? ? ? ? ? ? ? ? ? ?  ?  ? ? ? ? ? ? ?Anesthesia Physical ?Anesthesia Plan ? ?ASA: 4 ? ?Anesthesia Plan: MAC  ? ?Post-op Pain Management:   ? ?Induction: Intravenous ? ?PONV Risk Score and Plan: 1 and Propofol infusion and Treatment may vary due to age or medical condition ? ?Airway Management Planned: Nasal Cannula ? ?Additional Equipment:  ? ?Intra-op Plan:  ? ?Post-operative Plan:  ? ?Informed Consent: I have reviewed the patients History and Physical, chart, labs and  discussed the procedure including the risks, benefits and alternatives for the proposed anesthesia with the patient or authorized representative who has indicated his/her understanding and acceptance.  ? ? ? ?Dental advisory given ? ?Plan Discussed with: CRNA ? ?Anesthesia Plan Comments:   ? ? ? ? ? ? ?Anesthesia Quick Evaluation ? ?

## 2021-05-18 NOTE — Anesthesia Postprocedure Evaluation (Signed)
Anesthesia Post Note ? ?Patient: Stanley [name] ? ?Procedure(s) Performed: ESOPHAGOGASTRODUODENOSCOPY (EGD) WITH PROPOFOL ? ?  ? ?Patient location during evaluation: PACU ?Anesthesia Type: MAC ?Level of consciousness: awake ?Pain management: pain level controlled ?Vital Signs Assessment: post-procedure vital signs reviewed and stable ?Respiratory status: spontaneous breathing, nonlabored ventilation, respiratory function stable and patient connected to nasal cannula oxygen ?Cardiovascular status: stable and blood pressure returned to baseline ?Postop Assessment: no apparent nausea or vomiting ?Anesthetic complications: no ? ? ?No notable events documented. ? ?Last Vitals:  ?Vitals:  ? 05/18/21 1544 05/18/21 1547  ?BP:  110/67  ?Pulse:  70  ?Resp:  18  ?Temp: 36.8 ?C 36.8 ?C  ?SpO2:  100%  ?  ?Last Pain:  ?Vitals:  ? 05/18/21 1547  ?TempSrc: Oral  ?PainSc: 0-No pain  ? ? ?  ?  ?  ?  ?  ?  ? ?Stirling Orton P Nysia Dell ? ? ? ? ?

## 2021-05-18 NOTE — Discharge Instructions (Signed)
                  Intensive Outpatient Programs  [facility]    [facility] [address]     [address] [place],  [place]     [place], [place] [phone]      [phone]  [facility]   [facility]  (Inpatient and outpatient)  [phone] (Suboxone and Methadone) [address]           [phone]           [facility]    [facility] - Intensive Outpatient [address]     [address] [place], [place] [address]     [place], [place]  [phone]      [phone]  [facility] (Outpatient, Inpatient, [facility]  [facility] (Groups and [facility]idental) (insurance only) [phone]    [place], [place]          [phone]       [facility]    [facility] (for caregivers and family) [address]     [address] [place], [place]     [place], [place] [phone]      [phone]  Residential Treatment Programs  [facility]  [facility](2 years) Residential: 90 days)  [facility] [address]      [address] [place], [place]     [place], [place] [phone]      [phone] or [phone]  [facility]    [facility] [address]      [address] [place]     [place], [place] [phone]      [phone]  [facility]   [facility] ([facility] [address]     [address] [place], [place] [address]     [place], [place] [phone]      [phone] Admissions: 8am-3pm M-F  [facility]: Residential Program (90 Days)              [facility]: [facility]  [place], [place]     [place], [place]  [phone] or [phone]    (Walk in Hours over the weekend or by referral)   [facility]: [facility]:[phone] (for crisis  response 24 hours a day) 

## 2021-05-18 NOTE — Progress Notes (Signed)
?PROGRESS NOTE ? ? ? ?Stanley [name]  XBJ:[id number] DOB: 10-18-1965 DOA: 05/17/2021 ?PCP: Gildardo Pounds, NP  ? ? ?Brief Narrative:  ? ?Stanley [name] is a 56 y.o. male with past medical history significant for nonischemic cardiomyopathy, chronic systolic congestive heart failure, history of ventricular tachycardia s/p AICD, EtOH use disorder who presented to [facility] ED on 5/13 with shortness of breath, chest pain, hematemesis and dark stools.  Patient reports onset since Wednesday.  Chest pain is reported as intermittent, nonexertional.  He reports continued use of ibuprofen for his "gout". ? ?Patient with previous EGD/colonoscopy in March 2023 for GERD symptoms that was unrevealing. ? ?In the ED, temperature 99.6 ?F, HR 94, RR 20, BP 94/66, SPO2 100% on room air.  WBC 20.3, hemoglobin 8.8, platelets 298, MCV 100.8.  Sodium 138, potassium 3.2, chloride 111, CO2 18, glucose 127, BUN 66, creatinine 1.43.  High sensitive troponin 17>16.  FOBT positive.  UDS negative.  Chest x-ray with no active cardiopulmonary disease process.  EtOH level less than 10.  COVID-19 PCR negative.  Influenza A/B PCR negative.  GI consulted.  Hospital service consulted for further evaluation management with concerns for acute GI bleed likely secondary to gastric ulcer from NSAID abuse. ? ?Assessment & Plan: ?  ?Upper GI bleed ?Acute blood loss anemia ?Patient presenting to ED with recurrent episodes of hematemesis in the setting of dark tarry stools with reported NSAID abuse.  No use of anticoagulants/antiplatelets outpatient. ?--[facility] following; appreciate assistance ?--Hgb 11.7>8.8>7.6 ?--Protonix drip ?--H&H every 6 hours ?--Anemia panel ?--Transfuse for hemoglobin less than 7.0 ?--GI plans EGD today ? ?Nonischemic cardiomyopathy ?Chronic systolic congestive heart failure ?TTE 03/10/2019 with LVEF 30-86%, grade 1 diastolic dysfunction, LV with global hypokinesis, LV mildly dilated, RVSP 31.5 mmHg, mild MR, no aortic stenosis, IVC  normal in size.  Home regimen includes amiodarone 200 mg p.o. daily, carvedilol 25 mg p.o. twice daily, digoxin 0.125 mg p.o. daily, furosemide 80 mg M/W/F, isosorbide dinitrate 20 mg p.o. 3 times daily, BiDil 20-37.5 mg daily, losartan 100 mg p.o. daily, spironolactone 25 mg p.o. daily. ?--Continue amiodarone 200 mg p.o. daily ?--Continue digoxin 0.125 mg p.o. daily ?--Holding home carvedilol, furosemide, isosorbide dinitrate, BiDil, losartan, spironolactone due to AKI and borderline hypotension ?--Continue monitor BP closely ? ?Acute renal failure ?Baseline creatinine 0.9-1.2.  On presentation, creatinine elevated 1.44.  Etiology likely secondary to prerenal azotemia in the setting of vomiting versus ATN from hypotension. ?--Cr 1.44>1.31>1.43 ?--Holding carvedilol, furosemide, isosorbide dinitrate, BiDil, losartan, spironolactone ?--LR at 100 MLS per hour ?--Avoid nephrotoxins, renally dose all medications ?--Strict I's and O's ?--BMP daily ? ?Hyperlipidemia ?--Atorvastatin 10 mg p.o. daily ? ?EtOH abuse ?Counseled on need for complete cessation.  EtOH level less than 10. ?--CIWA protocol with symptom triggered Ativan ?--Multivitamin, folate, thiamine ? ? ?DVT prophylaxis: SCDs Start: 05/18/21 0542 ? ?  Code Status: Full Code ?Family Communication: Updated family present at bedside this morning ? ?Disposition Plan:  ?Level of care: Progressive ?Status is: Inpatient ?Remains inpatient appropriate because: Hemoglobin continued to trend down, GI plans EGD today, anticipate discharge home in 1-2 days ?  ? ?Consultants:  ?[facility] ? ?Procedures:  ?EGD pending ? ?Antimicrobials:  ?None ? ? ?Subjective: ?Patient seen examined bedside, resting comfortably.  Family present.  No further hematemesis or stool since yesterday.  Discussed with patient that hemoglobin continues to trend down and may require transfusion.  Awaiting GI for EGD today.  No other specific questions or concerns at this time.  Denies  headache, no dizziness, no chest pain, no palpitations, no shortness of breath, no cough/congestion, no fever/chills/night sweats, no abdominal pain, no weakness, no fatigue, no paresthesias.  No acute events overnight per nurse staff. ? ?Objective: ?Vitals:  ? 05/18/21 0953 05/18/21 1330 05/18/21 1332 05/18/21 1353  ?BP:  118/77  125/62  ?Pulse: 70 65  69  ?Resp:  14  14  ?Temp:   98 ?F (36.7 ?C) 97.8 ?F (36.6 ?C)  ?TempSrc:   Oral Temporal  ?SpO2:  100%  100%  ?Weight:      ?Height:      ? ? ?Intake/Output Summary (Last 24 hours) at 05/18/2021 1411 ?Last data filed at 05/18/2021 0941 ?Gross per 24 hour  ?Intake 200 ml  ?Output --  ?Net 200 ml  ? ?Filed Weights  ? 05/17/21 2211  ?Weight: 69.6 kg  ? ? ?Examination: ? ?Physical Exam: ?GEN: NAD, alert and oriented x 3, wd/wn ?HEENT: NCAT, PERRL, EOMI, sclera clear, MMM ?PULM: CTAB w/o wheezes/crackles, normal respiratory effort, on room air ?CV: RRR w/o M/G/R ?GI: abd soft, NTND, NABS, no R/G/M ?MSK: no peripheral edema, muscle strength globally intact 5/5 bilateral upper/lower extremities ?NEURO: CN II-XII intact, no focal deficits, sensation to light touch intact ?PSYCH: normal mood/affect ?Integumentary: dry/intact, no rashes or wounds ? ? ? ?Data Reviewed: I have personally reviewed following labs and imaging studies ? ?CBC: ?Recent Labs  ?Lab 05/17/21 ?1055 05/17/21 ?2246 05/18/21 ?5643  ?WBC 18.4* 20.3*  --   ?NEUTROABS  --  15.4*  --   ?HGB 11.7* 8.8* 7.6*  ?HCT 34.5* 26.8* 23.2*  ?MCV 98.9 100.8*  --   ?PLT 329 298  --   ? ?Basic Metabolic Panel: ?Recent Labs  ?Lab 05/17/21 ?1055 05/17/21 ?1629 05/17/21 ?2246  ?NA 138 138 138  ?K 2.6* 4.5 3.2*  ?CL 104 111 111  ?CO2 24 19* 18*  ?GLUCOSE 130* 123* 127*  ?BUN 76* 71* 66*  ?CREATININE 1.44* 1.31* 1.43*  ?CALCIUM 9.3 8.4* 8.7*  ?MG 2.2  --   --   ? ?GFR: ?Estimated Creatinine Clearance: 49.6 mL/min (A) (by C-G formula based on SCr of 1.43 mg/dL (H)). ?Liver Function Tests: ?Recent Labs  ?Lab 05/17/21 ?1055  ?AST 19   ?ALT 17  ?ALKPHOS 61  ?BILITOT 0.9  ?PROT 8.1  ?ALBUMIN 3.6  ? ?No results for input(s): LIPASE, AMYLASE in the last 168 hours. ?No results for input(s): AMMONIA in the last 168 hours. ?Coagulation Profile: ?Recent Labs  ?Lab 05/17/21 ?1055  ?INR 1.1  ? ?Cardiac Enzymes: ?No results for input(s): CKTOTAL, CKMB, CKMBINDEX, TROPONINI in the last 168 hours. ?BNP (last 3 results) ?Recent Labs  ?  05/24/20 ?1029  ?PROBNP 180  ? ?HbA1C: ?No results for input(s): HGBA1C in the last 72 hours. ?CBG: ?No results for input(s): GLUCAP in the last 168 hours. ?Lipid Profile: ?No results for input(s): CHOL, HDL, LDLCALC, TRIG, CHOLHDL, LDLDIRECT in the last 72 hours. ?Thyroid Function Tests: ?No results for input(s): TSH, T4TOTAL, FREET4, T3FREE, THYROIDAB in the last 72 hours. ?Anemia Panel: ?Recent Labs  ?  05/18/21 ?0735  ?FERRITIN 327  ? ?Sepsis Labs: ?No results for input(s): PROCALCITON, LATICACIDVEN in the last 168 hours. ? ?Recent Results (from the past 240 hour(s))  ?Resp Panel by RT-PCR (Flu A&B, Covid) Nasopharyngeal Swab     Status: None  ? Collection Time: 05/17/21 11:32 AM  ? Specimen: Nasopharyngeal Swab; Nasopharyngeal(NP) swabs in vial transport medium  ?Result Value Ref Range Status  ? SARS Coronavirus  2 by RT PCR NEGATIVE NEGATIVE Final  ?  Comment: (NOTE) ?SARS-CoV-2 target nucleic acids are NOT DETECTED. ? ?The SARS-CoV-2 RNA is generally detectable in upper respiratory ?specimens during the acute phase of infection. The lowest ?concentration of SARS-CoV-2 viral copies this assay can detect is ?138 copies/mL. A negative result does not preclude SARS-Cov-2 ?infection and should not be used as the sole basis for treatment or ?other patient management decisions. A negative result may occur with  ?improper specimen collection/handling, submission of specimen other ?than nasopharyngeal swab, presence of viral mutation(s) within the ?areas targeted by this assay, and inadequate number of viral ?copies(<138  copies/mL). A negative result must be combined with ?clinical observations, patient history, and epidemiological ?information. The expected result is Negative. ? ?Fact Sheet for Patients:  ?http://garza.org/

## 2021-05-18 NOTE — Anesthesia Procedure Notes (Signed)
Procedure Name: [place] ?Date/Time: 05/18/2021 2:29 PM ?Performed by: Jenne Campus, CRNA ?Pre-anesthesia Checklist: Patient identified, Emergency Drugs available, Suction available and Patient being monitored ?Oxygen Delivery Method: Nasal cannula ? ? ? ? ?

## 2021-05-18 NOTE — Consult Note (Addendum)
Consultation  Referring Provider:  [facility]/ [place] Primary Care Physician:  [name], NP Primary Gastroenterologist:  Dr. Adela Lank  Reason for Consultation: GI bleed, melena, hematemesis  HPI: Stanley [name] is a 56 y.o. male, known to Dr. Adela Lank, who underwent very recent endoscopic evaluation, for colon screening, and complaints of GERD and dysphagia. He had colonoscopy in March 2023 with removal of 210 mm polyps and was noted to have a few diverticuli and internal hemorrhoids, polyps were tubular adenomas.  EGD at that same setting with finding of a 1 cm hiatal hernia, he underwent empiric dilation to 17 mm was noted to have patchy gastritis, biopsies were taken and showed reactive gastropathy no H. pylori otherwise negative exam.  Since his his current symptoms started on Tuesday, 05/13/2021 with a tarry black bowel movement, followed later that day with nausea and vomiting at which point he got up straining dark.  The following day he had another black tarry stool and continued to have some episodes of dark blood with emesis.  He is also complaining of upper abdominal cramping and pain.  By the following day he was having some dizziness and shortness of breath with ambulation.  He presented to the emergency room, finally yesterday with complaints of shortness of breath, dizziness etc.  Labs were stable, hemoglobin 11.7, heme positive and elevated BUN, troponins were negative.  Decision was made to discharge him home with cardiology follow-up. He says when he was getting out of the car to go back into his house he felt dizzy and short of breath and his sister told him they were going back to the emergency room.  On arrival here with initial labs hemoglobin down to 8.8/hematocrit 26/WBC 20.3, BUN 76/creatinine 1.44 INR 1.1 LFTs within normal limits  This morning hemoglobin down to 7.6/23.3 hematocrit  Patient has been hemodynamically stable, has been n.p.o., on IV PPI  infusion. He has not had any further melena though feels as if he needs to have a bowel movement this morning, no further nausea or vomiting or hematemesis.  On further questioning he has been taking 1800 mg of ibuprofen at least every other day for recent gout flare and has been doing this for many weeks.  He also has been drinking on a daily basis. Eitel signs are stable this morning, he is comfortable lying down has no complaints of chest pain or shortness of breath and is satting well.131/72 123/65 pulse in the 70s  Patient does have history of nonischemic cardiomyopathy/congestive heart failure with EF of about 34%, coronary artery disease status post remote PCI, he is status post ICD, history of V. Tach.  No current antiplatelet therapy  Past Medical History:  Diagnosis Date   Acute renal failure (ARF) (HCC)    Acute respiratory failure with hypoxia (HCC) 12/29/2018   Alcohol abuse    Anxiety    CAD (coronary artery disease)    a. dx unclear, reported PCI in 2011 but cath 2014 normal coronaries and cardiac CT 07/2016 normal coronaries, no mention of stents.   Chronic chest pain    Chronic systolic CHF (congestive heart failure) (HCC)    Depression    Dyspnea    07/05/2019: per patient some times with exertion, "has to catch breath"   Financial difficulties    Gout    Gouty arthritis    Headache    History of noncompliance with medical treatment    financial challenges   Hypertension    ICD (implantable cardioverter-defibrillator)  in place    [employer] ICD implant done in [place] 2015   Insomnia    Lobar pneumonia (HCC) 12/28/2018   Myocardial infarction [facility]) 2005   Nonischemic cardiomyopathy (HCC)    Sleep apnea    per patient, has CPAP does not wear it every night   Ventricular tachycardia (HCC) 01/2015   2017 - ATP delivered for sinus tach, degenerated into VT for which he received shock. Recurrent VT 03/2018.    Past Surgical History:  Procedure Laterality Date    BAROREFLEX SYSTEM INSERTION     BIOPSY  03/06/2021   Procedure: BIOPSY;  Surgeon: Benancio Deeds, MD;  Location: [facility];  Service: Gastroenterology;;   CARDIAC DEFIBRILLATOR PLACEMENT  06/05/2013   [employer] Inogen ICD implanted at [facility] in [place] for primary prevention   COLONOSCOPY WITH PROPOFOL N/A 03/06/2021   Procedure: COLONOSCOPY WITH PROPOFOL;  Surgeon: Benancio Deeds, MD;  Location: [facility];  Service: Gastroenterology;  Laterality: N/A;   ESOPHAGEAL DILATION  03/06/2021   Procedure: ESOPHAGEAL DILATION;  Surgeon: Benancio Deeds, MD;  Location: [facility];  Service: Gastroenterology;;   ESOPHAGOGASTRODUODENOSCOPY (EGD) WITH PROPOFOL N/A 03/06/2021   Procedure: ESOPHAGOGASTRODUODENOSCOPY (EGD) WITH PROPOFOL;  Surgeon: Benancio Deeds, MD;  Location: [facility];  Service: Gastroenterology;  Laterality: N/A;   HERNIA REPAIR     PERCUTANEOUS CORONARY STENT INTERVENTION (PCI-S)  01/05/2009   POLYPECTOMY  03/06/2021   Procedure: POLYPECTOMY;  Surgeon: Benancio Deeds, MD;  Location: [facility];  Service: Gastroenterology;;    Prior to Admission medications   Medication Sig Start Date End Date Taking? Authorizing Provider  allopurinol (ZYLOPRIM) 100 MG tablet Take 1 tablet (100 mg total) by mouth daily. FOR GOUT 10/03/20  Yes [name], NP  amiodarone (PACERONE) 200 MG tablet Take 1 tablet (200 mg total) by mouth daily. 05/17/21  Yes Fondaw, Stevphen Meuse S, PA  Ascorbic Acid (VITAMIN C) 1000 MG tablet Take 1,000 mg by mouth daily.   Yes [provider]  atorvastatin (LIPITOR) 20 MG tablet Take 1 tablet (20 mg total) by mouth daily. 02/19/20  Yes [name], NP  carvedilol (COREG) 25 MG tablet TAKE 1 TABLET(25 MG) BY MOUTH TWICE DAILY WITH A MEAL Patient taking differently: Take 25 mg by mouth 2 (two) times daily with a meal. 02/28/21  Yes [name], NP  digoxin (LANOXIN) 0.125 MG tablet Take 0.5 tablets (0.0625  mg total) by mouth daily. 02/12/21  Yes Graciella Freer, PA-C  furosemide (LASIX) 80 MG tablet TAKE 1 TABLET BY MOUTH ON MONDAY, WEDNESDAY& FRIDAY. MAY TAKE 1 TABLET AS NEEDED FOR SWELLING Patient taking differently: Take 80 mg by mouth every Monday, Wednesday, and Friday. 02/12/21  Yes Graciella Freer, PA-C  ibuprofen (ADVIL) 200 MG tablet Take 400 mg by mouth every 6 (six) hours as needed for headache or moderate pain.   Yes [provider]  isosorbide dinitrate (ISORDIL) 20 MG tablet Take 20 mg by mouth 3 (three) times daily. 05/14/21  Yes [provider]  isosorbide-hydrALAZINE (BIDIL) 20-37.5 MG tablet TAKE 1 TABLET BY MOUTH THREE TIMES DAILY Patient taking differently: Take 1 tablet by mouth daily. 04/16/21  Yes Hoy Register, MD  losartan (COZAAR) 100 MG tablet Take 1 tablet (100 mg total) by mouth daily. 03/15/19  Yes Bensimhon, Bevelyn Buckles, MD  nitroGLYCERIN (NITROSTAT) 0.4 MG SL tablet Place 1 tablet (0.4 mg total) under the tongue every 5 (five) minutes x 3 doses as needed  for chest pain. 03/22/18  Yes Sheilah Pigeon, PA-C  pantoprazole (PROTONIX) 40 MG tablet Take 1 tablet (40 mg total) by mouth 2 (two) times daily. 05/17/21  Yes Fondaw, Wylder S, PA  potassium chloride SA (KLOR-CON) 20 MEQ tablet Take 3 tablets (60 mEq total) by mouth daily. Patient taking differently: Take 40 mEq by mouth daily. 11/05/20  Yes Graciella Freer, PA-C  sodium chloride (OCEAN) 0.65 % SOLN nasal spray Place 1 spray into both nostrils daily as needed for congestion.   Yes [provider]  spironolactone (ALDACTONE) 25 MG tablet Take 1 tablet (25 mg total) by mouth daily. Patient taking differently: Take 25 mg by mouth daily as needed (fluid). 12/08/19  Yes Graciella Freer, PA-C  traZODone (DESYREL) 150 MG tablet TAKE 1 TABLET(150 MG) BY MOUTH AT BEDTIME Patient taking differently: Take 150 mg by mouth at bedtime. 03/14/21  Yes [name], NP    Current  Facility-Administered Medications  Medication Dose Route Frequency Provider Last Rate Last Admin   acetaminophen (TYLENOL) tablet 650 mg  650 mg Oral Q6H PRN Hillary Bow, DO       Or   acetaminophen (TYLENOL) suppository 650 mg  650 mg Rectal Q6H PRN Hillary Bow, DO       allopurinol (ZYLOPRIM) tablet 100 mg  100 mg Oral Daily Julian Reil, Jared M, DO       amiodarone (PACERONE) tablet 200 mg  200 mg Oral Daily Julian Reil, Jared M, DO       atorvastatin (LIPITOR) tablet 20 mg  20 mg Oral Daily Julian Reil, Jared M, DO       digoxin (LANOXIN) tablet 0.0625 mg  0.0625 mg Oral Daily Julian Reil, Jared M, DO       folic acid (FOLVITE) tablet 1 mg  1 mg Oral Daily Julian Reil, Jared M, DO       lactated ringers infusion   Intravenous Continuous Hillary Bow, DO 100 mL/hr at 05/18/21 0601 New Bag at 05/18/21 0601   LORazepam (ATIVAN) tablet 1-4 mg  1-4 mg Oral Q1H PRN Hillary Bow, DO       Or   LORazepam (ATIVAN) injection 1-4 mg  1-4 mg Intravenous Q1H PRN Hillary Bow, DO       multivitamin with minerals tablet 1 tablet  1 tablet Oral Daily Lyda Perone M, DO       ondansetron [facility]) tablet 4 mg  4 mg Oral Q6H PRN Hillary Bow, DO       Or   ondansetron [facility]) injection 4 mg  4 mg Intravenous Q6H PRN Hillary Bow, DO       [START ON 05/21/2021] pantoprazole (PROTONIX) injection 40 mg  40 mg Intravenous Q12H Lyda Perone M, DO       pantoprozole (PROTONIX) 80 mg /NS 100 mL infusion  8 mg/hr Intravenous Continuous Lyda Perone M, DO 10 mL/hr at 05/18/21 0554 8 mg/hr at 05/18/21 0554   potassium chloride 10 mEq in 100 mL IVPB  10 mEq Intravenous Q1 Hr x 3 Hillary Bow, DO 100 mL/hr at 05/18/21 0841 10 mEq at 05/18/21 0841   thiamine tablet 100 mg  100 mg Oral Daily Hillary Bow, DO       Or   thiamine (B-1) injection 100 mg  100 mg Intravenous Daily Lyda Perone M, DO       traZODone (DESYREL) tablet 150 mg  150 mg Oral QHS Hillary Bow, DO  Current  Outpatient Medications  Medication Sig Dispense Refill   allopurinol (ZYLOPRIM) 100 MG tablet Take 1 tablet (100 mg total) by mouth daily. FOR GOUT 30 tablet 6   amiodarone (PACERONE) 200 MG tablet Take 1 tablet (200 mg total) by mouth daily. 21 tablet 0   Ascorbic Acid (VITAMIN C) 1000 MG tablet Take 1,000 mg by mouth daily.     atorvastatin (LIPITOR) 20 MG tablet Take 1 tablet (20 mg total) by mouth daily. 90 tablet 3   carvedilol (COREG) 25 MG tablet TAKE 1 TABLET(25 MG) BY MOUTH TWICE DAILY WITH A MEAL (Patient taking differently: Take 25 mg by mouth 2 (two) times daily with a meal.) 180 tablet 0   digoxin (LANOXIN) 0.125 MG tablet Take 0.5 tablets (0.0625 mg total) by mouth daily. 45 tablet 2   furosemide (LASIX) 80 MG tablet TAKE 1 TABLET BY MOUTH ON MONDAY, WEDNESDAY& FRIDAY. MAY TAKE 1 TABLET AS NEEDED FOR SWELLING (Patient taking differently: Take 80 mg by mouth every Monday, Wednesday, and Friday.) 45 tablet 2   ibuprofen (ADVIL) 200 MG tablet Take 400 mg by mouth every 6 (six) hours as needed for headache or moderate pain.     isosorbide dinitrate (ISORDIL) 20 MG tablet Take 20 mg by mouth 3 (three) times daily.     isosorbide-hydrALAZINE (BIDIL) 20-37.5 MG tablet TAKE 1 TABLET BY MOUTH THREE TIMES DAILY (Patient taking differently: Take 1 tablet by mouth daily.) 270 tablet 1   losartan (COZAAR) 100 MG tablet Take 1 tablet (100 mg total) by mouth daily. 30 tablet 6   nitroGLYCERIN (NITROSTAT) 0.4 MG SL tablet Place 1 tablet (0.4 mg total) under the tongue every 5 (five) minutes x 3 doses as needed for chest pain. 30 tablet 6   pantoprazole (PROTONIX) 40 MG tablet Take 1 tablet (40 mg total) by mouth 2 (two) times daily. 60 tablet 0   potassium chloride SA (KLOR-CON) 20 MEQ tablet Take 3 tablets (60 mEq total) by mouth daily. (Patient taking differently: Take 40 mEq by mouth daily.) 180 tablet 3   sodium chloride (OCEAN) 0.65 % SOLN nasal spray Place 1 spray into both nostrils daily as  needed for congestion.     spironolactone (ALDACTONE) 25 MG tablet Take 1 tablet (25 mg total) by mouth daily. (Patient taking differently: Take 25 mg by mouth daily as needed (fluid).) 90 tablet 3   traZODone (DESYREL) 150 MG tablet TAKE 1 TABLET(150 MG) BY MOUTH AT BEDTIME (Patient taking differently: Take 150 mg by mouth at bedtime.) 90 tablet 1    Allergies as of 05/17/2021 - Review Complete 05/17/2021  Allergen Reaction Noted   Lisinopril Shortness Of Breath 06/20/2015   Allopurinol Nausea Only 10/28/2020   Apple juice Swelling 07/03/2015   Entresto [sacubitril-valsartan]  12/06/2019   Other Swelling 07/03/2015   Shellfish allergy Swelling 06/20/2015    Family History  Problem Relation Age of Onset   Breast cancer Mother    Pancreatic cancer Mother    Cancer Mother        Pancreatic   Hypertension Father    Alzheimer's disease Father    Gout Father    Dementia Father    Hypertension Sister    Clotting disorder Sister    Obesity Brother    Bronchitis Brother    Heart disease Maternal Grandmother    Gout Paternal Grandfather    Colon polyps Neg Hx    Colon cancer Neg Hx    Esophageal cancer Neg Hx  Stomach cancer Neg Hx     Social History   Socioeconomic History   Marital status: Divorced    Spouse name: Not on file   Number of children: 1   Years of education: 12   Highest education level: Not on file  Occupational History   Occupation: Disability  Tobacco Use   Smoking status: Never   Smokeless tobacco: Never  Vaping Use   Vaping Use: Never used  Substance and Sexual Activity   Alcohol use: Yes    Comment: Admits to ongoing heavy drinking   Drug use: No   Sexual activity: Not Currently  Other Topics Concern   Not on file  Social History Narrative   Lives in [place] alone.  Disabled.  Previously worked as a Geophysicist/field seismologist.   Fun: Rest, Read and watch movies.    Social Determinants of Health   Financial Resource Strain: Not on file  Food  Insecurity: Not on file  Transportation Needs: Not on file  Physical Activity: Not on file  Stress: Not on file  Social Connections: Not on file  Intimate Partner Violence: Not on file    Review of Systems: Pertinent positive and negative review of systems were noted in the above HPI section.  All other review of systems was otherwise negative.   Physical Exam: Vital signs in last 24 hours: Temp:  [98.7 F (37.1 C)-100.1 F (37.8 C)] 99.6 F (37.6 C) (05/13 2203) Pulse Rate:  [65-122] 71 (05/14 0915) Resp:  [8-24] 17 (05/14 0915) BP: (94-154)/(59-114) 131/72 (05/14 0915) SpO2:  [96 %-100 %] 99 % (05/14 0915) Weight:  [69.6 kg] 69.6 kg (05/13 2211)   General:   Alert,  Well-developed, well-nourished, older African-American male pleasant and cooperative in NAD Head:  Normocephalic and atraumatic. Eyes:  Sclera clear, no icterus.   Conjunctiva pink. Ears:  Normal auditory acuity. Nose:  No deformity, discharge,  or lesions. Mouth:  No deformity or lesions.   Neck:  Supple; no masses or thyromegaly. Lungs:  Clear throughout to auscultation.   No wheezes, crackles, or rhonchi.  Heart:  Regular rate and rhythm; soft murmur clicks, rubs,  or gallops. Abdomen:  Soft, tender in the epigastrium and hypogastrium BS active,nonpalp mass or hsm.   Rectal: Not done, Msk:  Symmetrical without gross deformities. . Pulses:  Normal pulses noted. Extremities:  Without clubbing or edema. Neurologic:  Alert and  oriented x4;  grossly normal neurologically. Skin:  Intact without significant lesions or rashes.. Psych:  Alert and cooperative. Normal mood and affect.  Intake/Output from previous day: No intake/output data recorded. Intake/Output this shift: Total I/O In: 100 [IV Piggyback:100] Out: -   Lab Results: Recent Labs    05/17/21 1055 05/17/21 2246 05/18/21 0539  WBC 18.4* 20.3*  --   HGB 11.7* 8.8* 7.6*  HCT 34.5* 26.8* 23.2*  PLT 329 298  --    BMET Recent Labs     05/17/21 1055 05/17/21 1629 05/17/21 2246  NA 138 138 138  K 2.6* 4.5 3.2*  CL 104 111 111  CO2 24 19* 18*  GLUCOSE 130* 123* 127*  BUN 76* 71* 66*  CREATININE 1.44* 1.31* 1.43*  CALCIUM 9.3 8.4* 8.7*   LFT Recent Labs    05/17/21 1055  PROT 8.1  ALBUMIN 3.6  AST 19  ALT 17  ALKPHOS 61  BILITOT 0.9  BILIDIR <0.1  IBILI NOT CALCULATED   PT/INR Recent Labs    05/17/21 1055  LABPROT 13.7  INR 1.1  Hepatitis Panel No results for input(s): HEPBSAG, HCVAB, HEPAIGM, HEPBIGM in the last 72 hours.     IMPRESSION:  #23 56 year old African-American male with 5 to 6-day history of melena, upper abdominal pain, nausea vomiting and hematemesis describing dark blood.  This is in the setting of recent high-dose NSAID use/ibuprofen.  High suspicion for peptic ulcer disease. Had very recent EGD 03/27/2021 negative other than mild gastritis and empiric dilation for dysphagia. Also had colonoscopy 03/27/2021 pertinent for 2 adenomatous polyps and mild diverticulosis, internal hemorrhoids.  #2 anemia acute secondary to GI bleed #3 nonischemic cardiomyopathy EF 34% #4 coronary artery disease #5 history of V. tach status post ICD  #6 gout #7 EtOH abuse  PLAN: Keep n.p.o. Continue IV PPI infusion We will go ahead and transfuse 1 unit of packed RBCs, then transfuse to keep hemoglobin at least 7.5 given underlying significant coronary artery disease and cardiomyopathy  Patient has been scheduled for EGD with Dr. Myrtie Neither later this morning.  Procedure was discussed in detail with patient including indications risk and benefits and he is agreeable to proceed. We discussed importance of discontinuing ibuprofen altogether, not using any NSAIDs, and he will need to discuss alternative treatment for his gout with his PCP Also knows that he needs to discontinue EtOH.  Further recommendations pending findings at EGD   Amy Esterwood PA-C 05/18/2021, 9:24 AM  I have taken an interval  history, thoroughly reviewed the chart and examined the patient. I agree with the Advanced Practitioner's note, impression and recommendations, and have recorded additional findings, impressions and recommendations below. I performed a substantive portion of this encounter (>50% time spent), including a complete performance of the medical decision making.  My additional thoughts are as follows:  56 year old man with a relatively unremarkable upper endoscopy in the last 2 to 3 months, now presenting with melena and marked acute blood loss anemia worsening over the last 24 hours.  Heavy NSAID use recently for gout, recent concern for peptic ulcer.  He has lost dropped several grams of hemoglobin since his first presentation to the ED yesterday, symptoms continued and he presented to the ED again overnight.  He is currently hemodynamically stable, alert and conversational without chest pain.  He is getting progressively fatigued in the last 24 hours.  He also has some epigastric discomfort in the last few days. Cardiac rhythm is regular, lungs are clear bilaterally he is breathing comfortably on room air.  Abdomen soft nondistended with moderate epigastric tenderness with normal bowel sounds, no distention or guarding.  I suspicion for upper GI bleed, patient has good IV access, is on a Protonix drip, received a unit of PRBCs earlier today for hemoglobin that dropped down to 7.6, and plan is for an upper endoscopy soon.  He was agreeable after discussion of procedure and risks.  The benefits and risks of the planned procedure were described in detail with the patient or (when appropriate) their health care proxy.  Risks were outlined as including, but not limited to, bleeding, infection, perforation, adverse medication reaction leading to cardiac or pulmonary decompensation, pancreatitis (if ERCP).  The limitation of incomplete mucosal visualization was also discussed.  No guarantees or warranties were  given.  Patient at increased risk for cardiopulmonary complications of procedure due to medical comorbidities. Further recommendations to follow endoscopic findings. Charlie Pitter III [phone]

## 2021-05-18 NOTE — Interval H&P Note (Signed)
History and Physical Interval Note: ? ?05/18/2021 ?2:08 PM ? ?[name]  has presented today for surgery, with the diagnosis of GI bleed.  The various methods of treatment have been discussed with the patient and family. After consideration of risks, benefits and other options for treatment, the patient has consented to  Procedure(s): ?ESOPHAGOGASTRODUODENOSCOPY (EGD) WITH PROPOFOL (N/A) as a surgical intervention.  The patient's history has been reviewed, patient examined, no change in status, stable for surgery.  I have reviewed the patient's chart and labs.  Questions were answered to the patient's satisfaction.   ? ? ?Stanley [name] ? ? ?

## 2021-05-18 NOTE — Transfer of Care (Signed)
Immediate Anesthesia Transfer of Care Note ? ?Patient: Stanley [name] ? ?Procedure(s) Performed: ESOPHAGOGASTRODUODENOSCOPY (EGD) WITH PROPOFOL ? ?Patient Location: PACU ? ?Anesthesia Type:MAC ? ?Level of Consciousness: awake, oriented and patient cooperative ? ?Airway & Oxygen Therapy: Patient Spontanous Breathing and Patient connected to nasal cannula oxygen ? ?Post-op Assessment: Report given to RN and Post -op Vital signs reviewed and stable ? ?Post vital signs: Reviewed ? ?Last Vitals:  ?Vitals Value Taken Time  ?BP 96/52 05/18/21 1456  ?Temp 36.8 ?C 05/18/21 1452  ?Pulse 71 05/18/21 1456  ?Resp 18 05/18/21 1456  ?SpO2 98 % 05/18/21 1456  ?Vitals shown include unvalidated device data. ? ?Last Pain:  ?Vitals:  ? 05/18/21 1452  ?TempSrc: Temporal  ?PainSc: 0-No pain  ?   ? ?  ? ?Complications: No notable events documented. ?

## 2021-05-18 NOTE — ED Provider Notes (Signed)
?[facility] ?Provider Note ? ? ?CSN: [id number] ?Arrival date & time: 05/17/21  2131 ? ?  ? ?History ? ?Chief Complaint  ?Patient presents with  ? Chest Pain  ? Rectal Bleeding  ? ? ?Stanley [name] is a 56 y.o. male. ? ?56 yo M with a chief complaint of GI bleeding.  Patient tells me that he has been having issues for about 3 to 4 days.  Has been throwing up and having dark stools.  He was seen recently in the ER and had a stable hemoglobin and was discharged home.  He felt like the number of episodes had increased and so came for evaluation.  He has some mild abdominal discomfort that he thinks is due to the vomiting.  Denies any fevers or chills.  Denies blood thinner use.  Had a recent endoscopy. ? ? ?Chest Pain ?Rectal Bleeding ? ?  ? ?Home Medications ?Prior to Admission medications   ?Medication Sig Start Date End Date Taking? Authorizing Provider  ?allopurinol (ZYLOPRIM) 100 MG tablet Take 1 tablet (100 mg total) by mouth daily. FOR GOUT 10/03/20   Gildardo Pounds, NP  ?amiodarone (PACERONE) 200 MG tablet Take 1 tablet (200 mg total) by mouth daily. 05/17/21   Tedd Sias, PA  ?Ascorbic Acid (VITAMIN C) 1000 MG tablet Take 1,000 mg by mouth daily.    [provider]  ?atorvastatin (LIPITOR) 20 MG tablet Take 1 tablet (20 mg total) by mouth daily. 02/19/20   Gildardo Pounds, NP  ?carvedilol (COREG) 25 MG tablet TAKE 1 TABLET(25 MG) BY MOUTH TWICE DAILY WITH A MEAL ?Patient taking differently: Take 25 mg by mouth 2 (two) times daily with a meal. 02/28/21   Gildardo Pounds, NP  ?digoxin (LANOXIN) 0.125 MG tablet Take 0.5 tablets (0.0625 mg total) by mouth daily. 02/12/21   Shirley Friar, PA-C  ?furosemide (LASIX) 80 MG tablet TAKE 1 TABLET BY MOUTH ON MONDAY, WEDNESDAY& FRIDAY. MAY TAKE 1 TABLET AS NEEDED FOR SWELLING ?Patient taking differently: Take 80 mg by mouth 3 (three) times a week. 02/12/21   Shirley Friar, PA-C  ?ibuprofen (ADVIL) 200  MG tablet Take 400 mg by mouth every 6 (six) hours as needed.    [provider]  ?isosorbide dinitrate (ISORDIL) 20 MG tablet Take 20 mg by mouth 3 (three) times daily. 05/14/21   [provider]  ?isosorbide-hydrALAZINE (BIDIL) 20-37.5 MG tablet TAKE 1 TABLET BY MOUTH THREE TIMES DAILY ?Patient taking differently: Take 1 tablet by mouth daily. 04/16/21   Charlott Rakes, MD  ?losartan (COZAAR) 100 MG tablet Take 1 tablet (100 mg total) by mouth daily. 03/15/19   Bensimhon, Shaune Pascal, MD  ?nitroGLYCERIN (NITROSTAT) 0.4 MG SL tablet Place 1 tablet (0.4 mg total) under the tongue every 5 (five) minutes x 3 doses as needed for chest pain. 03/22/18   Baldwin Jamaica, PA-C  ?pantoprazole (PROTONIX) 40 MG tablet Take 1 tablet (40 mg total) by mouth 2 (two) times daily. 05/17/21   Tedd Sias, PA  ?PARoxetine (PAXIL) 20 MG tablet Take 20 mg by mouth daily.    [provider]  ?potassium chloride SA (KLOR-CON) 20 MEQ tablet Take 3 tablets (60 mEq total) by mouth daily. ?Patient taking differently: Take 40 mEq by mouth daily. 11/05/20   Shirley Friar, PA-C  ?sodium chloride (OCEAN) 0.65 % SOLN nasal spray Place 1 spray into both nostrils daily as needed for congestion.    [provider]  ?spironolactone (ALDACTONE) 25 MG tablet Take 1 tablet (25 mg total) by mouth daily. ?Patient taking differently: Take 25 mg by mouth daily as needed (fluid). 12/08/19   Shirley Friar, PA-C  ?traZODone (DESYREL) 150 MG tablet TAKE 1 TABLET(150 MG) BY MOUTH AT BEDTIME ?Patient taking differently: Take 150 mg by mouth at bedtime. 03/14/21   Gildardo Pounds, NP  ?   ? ?Allergies    ?Lisinopril, Allopurinol, Apple juice, Entresto [sacubitril-valsartan], Other, and Shellfish allergy   ? ?Review of Systems   ?Review of Systems  ?Cardiovascular:  Positive for chest pain.  ?Gastrointestinal:  Positive for hematochezia.  ? ?Physical Exam ?Updated Vital Signs ?BP 116/76   Pulse 73   Temp 99.6  ?F (37.6 ?C) (Oral)   Resp 12   Ht '5\' 5"'$  (1.651 m)   Wt 69.6 kg   SpO2 100%   BMI 25.53 kg/m?  ?Physical Exam ?Vitals and nursing note reviewed.  ?Constitutional:   ?   Appearance: He is well-developed.  ?HENT:  ?   Head: Normocephalic and atraumatic.  ?Eyes:  ?   Pupils: Pupils are equal, round, and reactive to light.  ?Neck:  ?   Vascular: No JVD.  ?Cardiovascular:  ?   Rate and Rhythm: Normal rate and regular rhythm.  ?   Heart sounds: No murmur heard. ?  No friction rub. No gallop.  ?Pulmonary:  ?   Effort: No respiratory distress.  ?   Breath sounds: No wheezing.  ?Abdominal:  ?   General: There is no distension.  ?   Tenderness: There is no abdominal tenderness. There is no guarding or rebound.  ?   Comments: Benign abdominal exam.  Tells me it hurts in the left upper and epigastrium  ?Musculoskeletal:     ?   General: Normal range of motion.  ?   Cervical back: Normal range of motion and neck supple.  ?Skin: ?   Coloration: Skin is not pale.  ?   Findings: No rash.  ?Neurological:  ?   Mental Status: He is alert and oriented to person, place, and time.  ?Psychiatric:     ?   Behavior: Behavior normal.  ? ? ?ED Results / Procedures / Treatments   ?Labs ?(all labs ordered are listed, but only abnormal results are displayed) ?Labs Reviewed  ?BASIC METABOLIC PANEL - Abnormal; Notable for the following components:  ?    Result Value  ? Potassium 3.2 (*)   ? CO2 18 (*)   ? Glucose, Bld 127 (*)   ? BUN 66 (*)   ? Creatinine, Ser 1.43 (*)   ? Calcium 8.7 (*)   ? GFR, Estimated 57 (*)   ? All other components within normal limits  ?CBC WITH DIFFERENTIAL/PLATELET - Abnormal; Notable for the following components:  ? WBC 20.3 (*)   ? RBC 2.66 (*)   ? Hemoglobin 8.8 (*)   ? HCT 26.8 (*)   ? MCV 100.8 (*)   ? Neutro Abs 15.4 (*)   ? Monocytes Absolute 1.4 (*)   ? Abs Immature Granulocytes 0.21 (*)   ? All other components within normal limits  ?DIGOXIN LEVEL - Abnormal; Notable for the following components:  ? Digoxin  Level 0.6 (*)   ? All other components within normal limits  ?TYPE AND SCREEN  ?TROPONIN I (HIGH SENSITIVITY)  ?TROPONIN I (HIGH SENSITIVITY)  ? ? ?EKG ?None ? ?Radiology ?DG Chest 2 View ? ?Result Date: 05/17/2021 ?CLINICAL  DATA:  Shortness of breath. EXAM: CHEST - 2 VIEW COMPARISON:  05/17/2021. FINDINGS: The heart size and mediastinal contours are within normal limits. No consolidation, effusion, or pneumothorax. A single lead pacemaker device is present over the left checked. A neurostimulator device with leads extending over the cervical soft tissues on the right. No acute osseous abnormality. IMPRESSION: No active cardiopulmonary disease. Electronically Signed   By: Brett Fairy M.D.   On: 05/17/2021 23:03  ? ?DG Chest 2 View ? ?Result Date: 05/17/2021 ?CLINICAL DATA:  Chest pain. EXAM: CHEST - 2 VIEW COMPARISON:  Chest radiographs and CTA 12/28/2018 FINDINGS: An ICD remains in place. The cardiac silhouette is upper limits of normal in size. The lungs are well inflated. No airspace consolidation, edema, pleural effusion, or pneumothorax is identified. A neural stimulator has been placed with lead coursing into the right neck. No acute osseous abnormality is seen. IMPRESSION: No active cardiopulmonary disease. Electronically Signed   By: Logan Bores M.D.   On: 05/17/2021 11:03   ? ?Procedures ?Procedures  ? ? ?Medications Ordered in ED ?Medications  ?morphine (PF) 4 MG/ML injection 4 mg (has no administration in time range)  ?ondansetron (ZOFRAN) injection 4 mg (has no administration in time range)  ? ? ?ED Course/ Medical Decision Making/ A&P ?  ?                        ?Medical Decision Making ?Amount and/or Complexity of Data Reviewed ?Labs: ordered. ? ?Risk ?Prescription drug management. ?Decision regarding hospitalization. ? ? ?56 yo M with a chief complaint of sound like gross melena.  He is describing dark and tarry stools but with some surrounding maroon-colored blood.  He is also had some coffee-ground  type emesis.  This been going on for about 3 or 4 days.  He was seen in the ED yesterday and had a stable hemoglobin but had worsening symptoms and so came here. ? ?His hemoglobin is dropped 3 g since yes

## 2021-05-18 NOTE — Op Note (Signed)
[facility] ?Patient Name: Stanley [name] ?Procedure Date : 05/18/2021 ?MRN: [id number] ?Attending MD: Estill Cotta. Loletha Carrow , MD ?Date of Birth: 1965/08/12 ?CSN: [id number] ?Age: 56 ?Admit Type: Inpatient ?Procedure:                Upper GI endoscopy ?Indications:              Acute post hemorrhagic anemia, Hematemesis, Melena ?                          Recent heavy NSAID use for gout. ?                          Patient has had 4 gram Hgb drop in 24 hours with  ?                          melena. There was reportedly an episode of  ?                          hematemesis prior to admission. ?Providers:                Estill Cotta. Loletha Carrow, MD, Doristine Johns, RN, Elberta Fortis  ?                          Johnella Moloney, Technician ?Referring MD:              ?Medicines:                Monitored Anesthesia Care ?Complications:            No immediate complications. ?Estimated Blood Loss:     Estimated blood loss: none. ?Procedure:                Pre-Anesthesia Assessment: ?                          - Prior to the procedure, a History and Physical  ?                          was performed, and patient medications and  ?                          allergies were reviewed. The patient's tolerance of  ?                          previous anesthesia was also reviewed. The risks  ?                          and benefits of the procedure and the sedation  ?                          options and risks were discussed with the patient.  ?                          All questions were answered, and informed consent  ?  was obtained. Prior Anticoagulants: The patient has  ?                          taken no previous anticoagulant or antiplatelet  ?                          agents. ASA Grade Assessment: IV - A patient with  ?                          severe systemic disease that is a constant threat  ?                          to life. After reviewing the risks and benefits,  ?                          the patient was  deemed in satisfactory condition to  ?                          undergo the procedure. ?                          After obtaining informed consent, the endoscope was  ?                          passed under direct vision. Throughout the  ?                          procedure, the patient's blood pressure, pulse, and  ?                          oxygen saturations were monitored continuously. The  ?                          [id number]-[id number] ([id number]) Olympus endoscope was introduced  ?                          through the mouth, and advanced to the third/4th  ?                          part of duodenum. The upper GI endoscopy was  ?                          somewhat difficult due to the patient's agitation.  ?                          The patient tolerated the procedure poorly due to  ?                          retching toward end of procedure. Difficuilt to  ?                          sedate - required vasocontrictor for BP support to  ?  allow sufficient propofol to complete procedure.  ?                          Even with that, patient moving and intermittently  ?                          agaitated. ?Scope In: ?Scope Out: ?Findings: ?     One benign-appearing, intrinsic moderate stenosis was found at the  ?     gastroesophageal junction. The stenosis was traversed. ?     A 1-2 cm hiatal hernia was present. ?     Few non-bleeding superficial gastric ulcers with no stigmata of bleeding  ?     were found in the gastric antrum. The largest lesion was 5 mm in largest  ?     dimension. No fresh or old blood in the UGIT. ?     The examined duodenum was normal. ?Impression:               - Benign-appearing esophageal stenosis. ?                          - 2 cm hiatal hernia. ?                          - Non-bleeding gastric ulcers with no stigmata of  ?                          bleeding. ?                          - Normal examined duodenum. ?                          - No specimens collected. ?                           The findings do not appear to explain the degree of  ?                          bleeding that occurred. Last passage of melena last  ?                          evening. ?                          This may have been right sided diverticular  ?                          bleeding (right and left colon tics found on recent  ?                          colonoscopy). ?Recommendation:           - Return patient to hospital ward for ongoing care. ?                          - Clear liquid diet. ?                          -  Continue present medications. ?                          - Serum H pylori antibody if he has not been  ?                          previously treated. (could not Bx stomach due to  ?                          patient agitation and need to complete procedure.) ?                          - Q 6 hr Hgb/Hct ?                          If continues to pass melena and drop Hgb, Abdominal  ?                          CTA if creatinine will allow, Tagged RBC scan if  ?                          not. ?Procedure Code(s):        --- Professional --- ?                          [phone], Esophagogastroduodenoscopy, flexible,  ?                          transoral; diagnostic, including collection of  ?                          specimen(s) by brushing or washing, when performed  ?                          (separate procedure) ?Diagnosis Code(s):        --- Professional --- ?                          K22.2, Esophageal obstruction ?                          K44.9, Diaphragmatic hernia without obstruction or  ?                          gangrene ?                          K25.9, Gastric ulcer, unspecified as acute or  ?                          chronic, without hemorrhage or perforation ?                          D62, Acute posthemorrhagic anemia ?                          K92.0, Hematemesis ?  K92.1, Melena (includes Hematochezia) ?CPT copyright 2019 American Medical Association. All rights  reserved. ?The codes documented in this report are preliminary and upon coder review may  ?be revised to meet current compliance requirements. ?Brylee Berk L. Loletha Carrow, MD ?05/18/2021 2:58:11 PM ?This report has been signed electronically. ?Number of Addenda: 0 ?

## 2021-05-18 NOTE — H&P (Signed)
?History and Physical  ? ? ?Patient: Stanley [name] YBO:[id number] DOB: [birth year] ?DOA: 05/17/2021 ?DOS: the patient was seen and examined on 05/18/2021 ?PCP: Gildardo Pounds, NP  ?Patient coming from: Home ? ?Chief Complaint:  ?Chief Complaint  ?Patient presents with  ? Chest Pain  ? Rectal Bleeding  ? ?HPI: Stanley [name] is a 56 y.o. male with medical history significant of NICM, VT s/p AICD placement, EtOH abuse. ? ?Pt with EGD for GERD like symptoms back in March, colonoscopy then too.  EGD at that time was mostly unremarkable / unimpressive. ? ?Pt presents to ED with ~3 day history of N/V, hematemesis, and dark stools as well.  Never had this before.  Some associated CP for the past day he says.  CP intermittent, not exertional. ? ?Seen in ED yesterday early afternoon but discharged home. ? ?After discharge the number of episodes increased so he comes back to the ED. ? ?Review of Systems: As mentioned in the history of present illness. All other systems reviewed and are negative. ?Past Medical History:  ?Diagnosis Date  ? Acute renal failure (ARF) (HCC)   ? Acute respiratory failure with hypoxia ([place]) 12/29/2018  ? Alcohol abuse   ? Anxiety   ? CAD (coronary artery disease)   ? a. dx unclear, reported PCI in 2011 but cath 2014 normal coronaries and cardiac CT 07/2016 normal coronaries, no mention of stents.  ? Chronic chest pain   ? Chronic systolic CHF (congestive heart failure) ([place])   ? Depression   ? Dyspnea   ? 07/05/2019: per patient some times with exertion, "has to catch breath"  ? Financial difficulties   ? Gout   ? Gouty arthritis   ? Headache   ? History of noncompliance with medical treatment   ? financial challenges  ? Hypertension   ? ICD (implantable cardioverter-defibrillator) in place   ? [employer] ICD implant done in [place] 2015  ? Insomnia   ? Lobar pneumonia ([place]) 12/28/2018  ? Myocardial infarction [facility]) 2005  ? Nonischemic cardiomyopathy ([place])   ? Sleep apnea   ? per patient, has CPAP  does not wear it every night  ? Ventricular tachycardia ([place]) 01/2015  ? 2017 - ATP delivered for sinus tach, degenerated into VT for which he received shock. Recurrent VT 03/2018.  ? ?Past Surgical History:  ?Procedure Laterality Date  ? BAROREFLEX SYSTEM INSERTION    ? BIOPSY  03/06/2021  ? Procedure: BIOPSY;  Surgeon: Yetta Flock, MD;  Location: [facility];  Service: Gastroenterology;;  ? CARDIAC DEFIBRILLATOR PLACEMENT  06/05/2013  ? [employer] ICD implanted at [employer] in [place] for primary prevention  ? COLONOSCOPY WITH PROPOFOL N/A 03/06/2021  ? Procedure: COLONOSCOPY WITH PROPOFOL;  Surgeon: Yetta Flock, MD;  Location: [facility];  Service: Gastroenterology;  Laterality: N/A;  ? ESOPHAGEAL DILATION  03/06/2021  ? Procedure: ESOPHAGEAL DILATION;  Surgeon: Yetta Flock, MD;  Location: [facility];  Service: Gastroenterology;;  ? ESOPHAGOGASTRODUODENOSCOPY (EGD) WITH PROPOFOL N/A 03/06/2021  ? Procedure: ESOPHAGOGASTRODUODENOSCOPY (EGD) WITH PROPOFOL;  Surgeon: Yetta Flock, MD;  Location: [facility];  Service: Gastroenterology;  Laterality: N/A;  ? HERNIA REPAIR    ? PERCUTANEOUS CORONARY STENT INTERVENTION (PCI-S)  01/05/2009  ? POLYPECTOMY  03/06/2021  ? Procedure: POLYPECTOMY;  Surgeon: Yetta Flock, MD;  Location: [facility];  Service: Gastroenterology;;  ? ?Social History:  reports that he has never smoked. He has never used smokeless tobacco. He reports current  alcohol use. He reports that he does not use drugs. ? ?Allergies  ?Allergen Reactions  ? Lisinopril Shortness Of Breath  ?  Makes lips swell  ? Allopurinol Nausea Only  ?  dizziness  ? Apple Juice Swelling  ?  Causes Gout to flare up   ? Entresto [Sacubitril-Valsartan]   ?  History of angioedema  ? Other Swelling  ?  Nuts - Lip Swelling  ? Shellfish Allergy Swelling  ?  Lip Swelling  ? ? ?Family History  ?Problem Relation Age of Onset  ? Breast cancer Mother   ? Pancreatic cancer  Mother   ? Cancer Mother   ?     Pancreatic  ? Hypertension Father   ? Alzheimer's disease Father   ? Gout Father   ? Dementia Father   ? Hypertension Sister   ? Clotting disorder Sister   ? Obesity Brother   ? Bronchitis Brother   ? Heart disease Maternal Grandmother   ? Gout Paternal Grandfather   ? Colon polyps Neg Hx   ? Colon cancer Neg Hx   ? Esophageal cancer Neg Hx   ? Stomach cancer Neg Hx   ? ? ?Prior to Admission medications   ?Medication Sig Start Date End Date Taking? Authorizing Provider  ?allopurinol (ZYLOPRIM) 100 MG tablet Take 1 tablet (100 mg total) by mouth daily. FOR GOUT 10/03/20  Yes Gildardo Pounds, NP  ?amiodarone (PACERONE) 200 MG tablet Take 1 tablet (200 mg total) by mouth daily. 05/17/21  Yes Tedd Sias, PA  ?Ascorbic Acid (VITAMIN C) 1000 MG tablet Take 1,000 mg by mouth daily.   Yes [provider]  ?atorvastatin (LIPITOR) 20 MG tablet Take 1 tablet (20 mg total) by mouth daily. 02/19/20  Yes Gildardo Pounds, NP  ?carvedilol (COREG) 25 MG tablet TAKE 1 TABLET(25 MG) BY MOUTH TWICE DAILY WITH A MEAL ?Patient taking differently: Take 25 mg by mouth 2 (two) times daily with a meal. 02/28/21  Yes Gildardo Pounds, NP  ?digoxin (LANOXIN) 0.125 MG tablet Take 0.5 tablets (0.0625 mg total) by mouth daily. 02/12/21  Yes Shirley Friar, PA-C  ?furosemide (LASIX) 80 MG tablet TAKE 1 TABLET BY MOUTH ON MONDAY, WEDNESDAY& FRIDAY. MAY TAKE 1 TABLET AS NEEDED FOR SWELLING ?Patient taking differently: Take 80 mg by mouth every Monday, Wednesday, and Friday. 02/12/21  Yes Shirley Friar, PA-C  ?ibuprofen (ADVIL) 200 MG tablet Take 400 mg by mouth every 6 (six) hours as needed for headache or moderate pain.   Yes [provider]  ?isosorbide dinitrate (ISORDIL) 20 MG tablet Take 20 mg by mouth 3 (three) times daily. 05/14/21  Yes [provider]  ?isosorbide-hydrALAZINE (BIDIL) 20-37.5 MG tablet TAKE 1 TABLET BY MOUTH THREE TIMES DAILY ?Patient taking  differently: Take 1 tablet by mouth daily. 04/16/21  Yes Charlott Rakes, MD  ?losartan (COZAAR) 100 MG tablet Take 1 tablet (100 mg total) by mouth daily. 03/15/19  Yes Bensimhon, Shaune Pascal, MD  ?nitroGLYCERIN (NITROSTAT) 0.4 MG SL tablet Place 1 tablet (0.4 mg total) under the tongue every 5 (five) minutes x 3 doses as needed for chest pain. 03/22/18  Yes Baldwin Jamaica, PA-C  ?pantoprazole (PROTONIX) 40 MG tablet Take 1 tablet (40 mg total) by mouth 2 (two) times daily. 05/17/21  Yes [place], Ova [place] S, PA  ?potassium chloride SA (KLOR-CON) 20 MEQ tablet Take 3 tablets (60 mEq total) by mouth daily. ?Patient taking differently: Take 40 mEq by mouth daily. 11/05/20  Yes Shirley Friar, PA-C  ?sodium chloride (OCEAN) 0.65 % SOLN nasal spray Place 1 spray into both nostrils daily as needed for congestion.   Yes [provider]  ?spironolactone (ALDACTONE) 25 MG tablet Take 1 tablet (25 mg total) by mouth daily. ?Patient taking differently: Take 25 mg by mouth daily as needed (fluid). 12/08/19  Yes Shirley Friar, PA-C  ?traZODone (DESYREL) 150 MG tablet TAKE 1 TABLET(150 MG) BY MOUTH AT BEDTIME ?Patient taking differently: Take 150 mg by mouth at bedtime. 03/14/21  Yes Gildardo Pounds, NP  ? ? ?Physical Exam: ?Vitals:  ? 05/18/21 0400 05/18/21 0415 05/18/21 0430 05/18/21 0445  ?BP: 116/76 120/73 129/75 120/73  ?Pulse: 73 74 69 75  ?Resp: 12 12  (!) 23  ?Temp:      ?TempSrc:      ?SpO2: 100% 100% 100% 100%  ?Weight:      ?Height:      ? ?Constitutional: NAD, calm, comfortable ?Eyes: PERRL, lids and conjunctivae normal ?ENMT: Mucous membranes are moist. Posterior pharynx clear of any exudate or lesions.Normal dentition.  ?Neck: normal, supple, no masses, no thyromegaly ?Respiratory: clear to auscultation bilaterally, no wheezing, no crackles. Normal respiratory effort. No accessory muscle use.  ?Cardiovascular: Regular rate and rhythm, no murmurs / rubs / gallops. No extremity edema. 2+ pedal  pulses. No carotid bruits.  ?Abdomen: no tenderness, no masses palpated. No hepatosplenomegaly. Bowel sounds positive.  ?Musculoskeletal: no clubbing / cyanosis. No joint deformity upper and lower extremities. Goo

## 2021-05-18 NOTE — Assessment & Plan Note (Signed)
Due to UGIB ?1. Pt is no longer a practicing Jehovah's witness, he states he is willing to have blood transfusion if needed at this time.  He has signed consent form for blood, I have removed the blood refusal notice from the chart ?2. Type and screen ?3. H/H Q6H, first draw now. ?4. Transfuse if HGB below 7.0 ?

## 2021-05-18 NOTE — Assessment & Plan Note (Signed)
Likely pre-renal in setting of acute GIB. ?1. Hold lasix, aldactone, and losartan ?2. IVF: LR at 100 ?3. Strict intake and output ?4. Repeat BMP tomorrow AM ?

## 2021-05-18 NOTE — Progress Notes (Signed)
Date and time results received: 05/18/21 1655 ?(use smartphrase ".now" to insert current time) ? ?Test: Hemoglobin ?Critical Value: 6.9 ? ?Name of Provider Notified: Dr. [place] ? ?Orders Received? Or Actions Taken?:  Pt already has an order for blood transfusion.  Awaiting for a blood.   Received order to add BMP with post transfusion H/H. ?

## 2021-05-18 NOTE — Assessment & Plan Note (Addendum)
Pt with reported hematemesis, hematochezia.  HGB drop 13.5 on 4/27 -> 11.7 at 10am yesterday 5/13 -> 8.8 just a few hours later at 10pm ?AKI with very significant BUN elevation also supports this. ?DDx includes EtOH gastritis from ongoing EtOH use/abuse, NSAID ulcer from Ibuprofen use, other. ?Doubt variceal bleed: despite h/o EtOH abuse, pt has no known h/o cirrhosis, furthermore he had no esophageal nor gastric varices seen on EGD that was done a mere 2 months ago in March. ?1. NPO ?2. IVF: LR at 125 ?3. GI consult in AM (EDP sent message to [facility]) ?4. Tele monitor ?5. See ABLA below ?6. On no blood thinners ?7. plt normal ?8. INR was 1.1 yesterday ?

## 2021-05-18 NOTE — Assessment & Plan Note (Signed)
Admits to ongoing heavy use. ?Will put on CIWA ?

## 2021-05-19 ENCOUNTER — Encounter (HOSPITAL_COMMUNITY): Payer: Self-pay | Admitting: Gastroenterology

## 2021-05-19 DIAGNOSIS — D62 Acute posthemorrhagic anemia: Secondary | ICD-10-CM | POA: Diagnosis not present

## 2021-05-19 DIAGNOSIS — N179 Acute kidney failure, unspecified: Secondary | ICD-10-CM | POA: Diagnosis not present

## 2021-05-19 DIAGNOSIS — K5730 Diverticulosis of large intestine without perforation or abscess without bleeding: Secondary | ICD-10-CM

## 2021-05-19 DIAGNOSIS — K259 Gastric ulcer, unspecified as acute or chronic, without hemorrhage or perforation: Secondary | ICD-10-CM

## 2021-05-19 DIAGNOSIS — K920 Hematemesis: Secondary | ICD-10-CM | POA: Diagnosis not present

## 2021-05-19 DIAGNOSIS — I5022 Chronic systolic (congestive) heart failure: Secondary | ICD-10-CM | POA: Diagnosis not present

## 2021-05-19 LAB — CBC
HCT: 24.9 % — ABNORMAL LOW (ref 39.0–52.0)
HCT: 21.3 % — ABNORMAL LOW (ref 39.0–52.0)
Hemoglobin: 7.4 g/dL — ABNORMAL LOW (ref 13.0–17.0)
Hemoglobin: 7 g/dL — ABNORMAL LOW (ref 13.0–17.0)
MCH: 32.3 pg (ref 26.0–34.0)
MCH: 32.7 pg (ref 26.0–34.0)
MCHC: 29.7 g/dL — ABNORMAL LOW (ref 30.0–36.0)
MCHC: 32.9 g/dL (ref 30.0–36.0)
MCV: 108.7 fL — ABNORMAL HIGH (ref 80.0–100.0)
MCV: 99.5 fL (ref 80.0–100.0)
Platelets: 172 10*3/uL (ref 150–400)
Platelets: 227 10*3/uL (ref 150–400)
RBC: 2.29 MIL/uL — ABNORMAL LOW (ref 4.22–5.81)
RBC: 2.14 MIL/uL — ABNORMAL LOW (ref 4.22–5.81)
RDW: 14.2 % (ref 11.5–15.5)
RDW: 14 % (ref 11.5–15.5)
WBC: 15.2 10*3/uL — ABNORMAL HIGH (ref 4.0–10.5)
WBC: 14.7 10*3/uL — ABNORMAL HIGH (ref 4.0–10.5)
nRBC: 0.2 % (ref 0.0–0.2)
nRBC: 0.2 % (ref 0.0–0.2)

## 2021-05-19 LAB — BASIC METABOLIC PANEL
Anion gap: 5 (ref 5–15)
Anion gap: 4 — ABNORMAL LOW (ref 5–15)
BUN: 29 mg/dL — ABNORMAL HIGH (ref 6–20)
BUN: 23 mg/dL — ABNORMAL HIGH (ref 6–20)
CO2: 20 mmol/L — ABNORMAL LOW (ref 22–32)
CO2: 21 mmol/L — ABNORMAL LOW (ref 22–32)
Calcium: 8.3 mg/dL — ABNORMAL LOW (ref 8.9–10.3)
Calcium: 8.4 mg/dL — ABNORMAL LOW (ref 8.9–10.3)
Chloride: 114 mmol/L — ABNORMAL HIGH (ref 98–111)
Chloride: 114 mmol/L — ABNORMAL HIGH (ref 98–111)
Creatinine, Ser: 1.13 mg/dL (ref 0.61–1.24)
Creatinine, Ser: 1.15 mg/dL (ref 0.61–1.24)
GFR, Estimated: >60 mL/min (ref >60)
GFR, Estimated: >60 mL/min (ref >60)
Glucose, Bld: 77 mg/dL (ref 70–99)
Glucose, Bld: 82 mg/dL (ref 70–99)
Potassium: 2.9 mmol/L — ABNORMAL LOW (ref 3.5–5.1)
Potassium: 3.4 mmol/L — ABNORMAL LOW (ref 3.5–5.1)
Sodium: 139 mmol/L (ref 135–145)
Sodium: 139 mmol/L (ref 135–145)

## 2021-05-19 LAB — MAGNESIUM
Magnesium: 1.7 mg/dL (ref 1.7–2.4)
Magnesium: 2.4 mg/dL (ref 1.7–2.4)

## 2021-05-19 LAB — HEMOGLOBIN AND HEMATOCRIT, BLOOD
HCT: 28.2 % — ABNORMAL LOW (ref 39.0–52.0)
HCT: 27.4 % — ABNORMAL LOW (ref 39.0–52.0)
Hemoglobin: 9.3 g/dL — ABNORMAL LOW (ref 13.0–17.0)
Hemoglobin: 8.9 g/dL — ABNORMAL LOW (ref 13.0–17.0)

## 2021-05-19 LAB — URIC ACID: Uric Acid, Serum: 6.8 mg/dL (ref 3.7–8.6)

## 2021-05-19 LAB — PREPARE RBC (CROSSMATCH)

## 2021-05-19 MED ORDER — TRAMADOL HCL 50 MG PO TABS
50.0000 mg | ORAL_TABLET | Freq: Four times a day (QID) | ORAL | Status: DC | PRN
  Administered 2021-05-19 – 2021-05-20 (×3): 50 mg via ORAL
  Filled 2021-05-19 (×3): qty 1

## 2021-05-19 MED ORDER — MAGNESIUM SULFATE IN D5W 1-5 GM/100ML-% IV SOLN
1.0000 g | Freq: Once | INTRAVENOUS | Status: AC
  Administered 2021-05-19: 1 g via INTRAVENOUS
  Filled 2021-05-19: qty 100

## 2021-05-19 MED ORDER — POTASSIUM CHLORIDE 20 MEQ PO PACK
40.0000 meq | PACK | ORAL | Status: AC
  Administered 2021-05-19 (×2): 40 meq via ORAL
  Filled 2021-05-19 (×2): qty 2

## 2021-05-19 MED ORDER — LIDOCAINE 5 % EX PTCH
1.0000 | MEDICATED_PATCH | CUTANEOUS | Status: DC
  Administered 2021-05-19: 1 via TRANSDERMAL
  Filled 2021-05-19: qty 1

## 2021-05-19 MED ORDER — MUSCLE RUB 10-15 % EX CREA
TOPICAL_CREAM | CUTANEOUS | Status: DC | PRN
  Filled 2021-05-19: qty 85

## 2021-05-19 MED ORDER — OXYCODONE HCL 5 MG PO TABS
5.0000 mg | ORAL_TABLET | Freq: Once | ORAL | Status: AC | PRN
  Administered 2021-05-20: 5 mg via ORAL
  Filled 2021-05-19: qty 1

## 2021-05-19 MED ORDER — SODIUM CHLORIDE 0.9% IV SOLUTION: Freq: Once | INTRAVENOUS | Status: DC

## 2021-05-19 NOTE — Progress Notes (Signed)
Initial Nutrition Assessment ? ?DOCUMENTATION CODES:  ?Not applicable ? ?INTERVENTION:  ?Continue current diet as ordered, encourage PO intake ?Continue vitamin regimen ?Continue Boost Breeze po TID, each supplement provides 250 kcal and 9 grams of protein ? ?NUTRITION DIAGNOSIS:  ?Inadequate oral intake related to decreased appetite as evidenced by per patient/family report. ? ?GOAL:  ?Patient will meet greater than or equal to 90% of their needs ? ?MONITOR:  ?PO intake, Labs, Supplement acceptance, Weight trends, Diet advancement ? ?REASON FOR ASSESSMENT:  ?Malnutrition Screening Tool ?  ? ?ASSESSMENT:  ?Pt with hx of HTN, gout, CAD with hx of MI, CHF, and hx EtOH abuse presented to ED with bloody stools and coffee ground emesis.  ? ?Pt reported on admission to ongoing heavy EtOH use and that he has been taking NSAIDs for several weeks for a gout flareup. EGD performed 5/14 was unrevealing  - did not show findings that explained his degree of bleeding. ? ?Pt unavailable x 2 attempts today. Pt reported poor intake and weight loss on RN admission screen. Noted that diet was advanced to regular by GI earlier today but pt appears to have a good appetite with 100% x 2 meals. Reviewed weight, 14.7% weight loss noted x 1 year which is not severe, but concerning if unintentional. ? ?Boost breeze currently in place, with documented acceptance. Will continue for now and follow-up to determine if a higher kcal supplement is needed for pt. Vitamin regimen in place. ? ?Average Meal Intake: ?5/15: 100% intake x 2 recorded meals ? ?Nutritionally Relevant Medications: ?Scheduled Meds: ? atorvastatin  20 mg Oral Daily  ? Boost Breeze  1 Container Oral TID BM  ? folic acid  1 mg Oral Daily  ? multivitamin with minerals  1 tablet Oral Daily  ? potassium chloride  40 mEq Oral Q4H  ? thiamine  100 mg Oral Daily  ? ?Continuous Infusions: ? lactated ringers 100 mL/hr at 05/19/21 0500  ? pantoprazole 8 mg/hr (05/18/21 0554)  ? ?PRN Meds:  ondansetron ? ?Labs Reviewed: ?K 3.4 ?BUN 23 ? ?NUTRITION - FOCUSED PHYSICAL EXAM: ?Defer to in-person assessment. ? ?Diet Order:   ?Diet Order   ? ?       ?  Diet regular Room service appropriate? Yes; Fluid consistency: Thin  Diet effective now       ?  ? ?  ?  ? ?  ? ? ?EDUCATION NEEDS:  ?No education needs have been identified at this time ? ?Skin:  Skin Assessment: Reviewed RN Assessment ? ?Last BM:  5/13 ? ?Height:  ?Ht Readings from Last 1 Encounters:  ?05/17/21 '5\' 5"'$  (1.651 m)  ? ? ?Weight:  ?Wt Readings from Last 1 Encounters:  ?05/17/21 69.6 kg  ? ? ?Ideal Body Weight:  61.8 kg ? ?BMI:  Body mass index is 25.53 kg/m?. ? ?Estimated Nutritional Needs:  ?Kcal:  1800-2000 kcal/d ?Protein:  90-100 g/d ?Fluid:  >/=2L/d ? ? ?Ranell Patrick, RD, LDN ?Clinical Dietitian ?RD pager # available in [place]  ?After hours/weekend pager # available in [place] ?

## 2021-05-19 NOTE — Progress Notes (Addendum)
? ? Attending physician's note  ? ?I have taken a history, reviewed the chart, and examined the patient. I performed a substantive portion of this encounter, including complete performance of at least one of the key components, in conjunction with the APP. I agree with the APP's note, impression, and recommendations with my edits.  ? ?Transfused another unit PRBCs earlier today for hemoglobin 7.0.  Total 2 units on this admission.  Did have some darker stools earlier today, but second BM without overt bleeding.  Tolerating full diet late this afternoon without any issues. ? ?- Continue monitoring for recurrence of overt bleeding overnight ?- Check posttransfusion CBC and repeat CBC in the a.m. for stability ?- If acute rebleeding event, plan for CTA (if large volume/brisk) ?- NSAID avoidance ?- If no recurrence of bleeding, anticipate discharge in the next 24 hours or so ? ?[address] DO, FACG ?[phone] office  ? ?   ? ?        ? ?Daily Rounding Note ? ?05/19/2021, 2:00 PM ? LOS: 1 day  ? ?SUBJECTIVE:   ?Chief complaint:   GI bleed w dark emesis and dark stool  ? ?Vital signs stable. ?Patient just had a bowel movement.  Stool was formed small pieces of very dark stool but no blood leaching out from the stool. ?Patient feels well.  No dizziness with getting up out of bed to use the restroom.  No shortness of breath.  No nausea or abdominal pain.  Still on clear liquid diet. ? ?OBJECTIVE:        ? Vital signs in last 24 hours:    ?Temp:  [97.9 ?F (36.6 ?C)-99 ?F (37.2 ?C)] 98.1 ?F (36.7 ?C) (05/15 1145) ?Pulse Rate:  [48-88] 48 (05/15 1200) ?Resp:  [15-21] 17 (05/15 1200) ?BP: (87-146)/(37-106) 123/69 (05/15 1200) ?SpO2:  [91 %-100 %] 91 % (05/15 1200) ?Last BM Date : 05/17/21 ?Filed Weights  ? 05/17/21 2211  ?Weight: 69.6 kg  ? ?General: Looks well.  NAD. ?Heart: RRR. ?Chest: No labored breathing or cough.  Lungs clear bilaterally. ?Abdomen: Soft without  tenderness.  No distention.  Active bowel sounds. ?Extremities: No CCE. ?Neuro/Psych: Pleasant, cooperative, fully oriented.  No gross deficits, tremors. ? ?Intake/Output from previous day: ?05/14 0701 - 05/15 0700 ?In: 3039.6 [P.O.:240; I.V.:2052.8; Blood:458; IV Piggyback:288.8] ?Out: 625 [Urine:625] ? ?Intake/Output this shift: ?Total I/O ?In: 1054.9 [P.O.:240; I.V.:499.9; Blood:315] ?Out: 250 [Urine:250] ? ?Lab Results: ?Recent Labs  ?  05/17/21 ?2246 05/18/21 ?4709 05/18/21 ?1545 05/19/21 ?0023 05/19/21 ?6283  ?WBC 20.3*  --   --  15.2* 14.7*  ?HGB 8.8*   < > 6.9* 7.4* 7.0*  ?HCT 26.8*   < > 21.0* 24.9* 21.3*  ?PLT 298  --   --  172 227  ? < > = values in this interval not displayed.  ? ?BMET ?Recent Labs  ?  05/17/21 ?2246 05/19/21 ?0023 05/19/21 ?6629  ?NA 138 139 139  ?K 3.2* 2.9* 3.4*  ?CL 111 114* 114*  ?CO2 18* 20* 21*  ?GLUCOSE 127* 77 82  ?BUN 66* 29* 23*  ?CREATININE 1.43* 1.13 1.15  ?CALCIUM 8.7* 8.3* 8.4*  ? ?LFT ?Recent Labs  ?  05/17/21 ?1055  ?PROT 8.1  ?ALBUMIN 3.6  ?AST 19  ?ALT 17  ?ALKPHOS 61  ?BILITOT 0.9  ?BILIDIR <0.1  ?IBILI NOT CALCULATED  ? ?PT/INR ?Recent Labs  ?  05/17/21 ?1055  ?LABPROT 13.7  ?INR 1.1  ? ?Hepatitis Panel ?No results for input(s): HEPBSAG,  HCVAB, [place], HEPBIGM in the last 72 hours. ? ?Studies/Results: ?DG Chest 2 View ? ?Result Date: 05/17/2021 ?CLINICAL DATA:  Shortness of breath. EXAM: CHEST - 2 VIEW COMPARISON:  05/17/2021. FINDINGS: The heart size and mediastinal contours are within normal limits. No consolidation, effusion, or pneumothorax. A single lead pacemaker device is present over the left checked. A neurostimulator device with leads extending over the cervical soft tissues on the right. No acute osseous abnormality. IMPRESSION: No active cardiopulmonary disease. Electronically Signed   By: Brett Fairy M.D.   On: 05/17/2021 23:03   ? ?ASSESMENT:  ? ?    Dark emesis, melena.  05/18/2021 EGD showing benign esophageal stenosis, HH.  Nonbleeding gastric  ulcers with no stigmata of bleeding.  Findings do not explain the level of bleeding reported.  May have had right-sided diverticular bleed. Patient had been using daily high doses of ibuprofen to address gout flare   Established diagnosis of right and left colon diverticulosis based on 03/2021 colonoscopy with polypectomies. ? ?Blood loss anemia.  No evidence of iron deficiency, B12 or folate deficiencies.   ?Hgb 6.9... 7.  Getting PRBC # 2 this AM.   ? ?PLAN  ? ?Changed to regular diet.   ? ?Make sure patient has adequate supply of colchicine to use for gout flares.  He says this is worked well in the past.  Not sure if there is any contraindications to its use. ? ? ? ?Stanley [name]  05/19/2021, 2:00 PM ?Phone [phone]  ?

## 2021-05-19 NOTE — Progress Notes (Signed)
?PROGRESS NOTE ? ? ? ?Stanley [name]  BWG:[id number] DOB: July 03, 1965 DOA: 05/17/2021 ?PCP: Gildardo Pounds, NP  ? ? ?Brief Narrative:  ? ?Stanley [name] is a 56 y.o. male with past medical history significant for nonischemic cardiomyopathy, chronic systolic congestive heart failure, history of ventricular tachycardia s/p AICD, EtOH use disorder who presented to [facility] ED on 5/13 with shortness of breath, chest pain, hematemesis and dark stools.  Patient reports onset since Wednesday.  Chest pain is reported as intermittent, nonexertional.  He reports continued use of ibuprofen for his "gout". ? ?Patient with previous EGD/colonoscopy in March 2023 for GERD symptoms that was unrevealing. ? ?In the ED, temperature 99.6 ?F, HR 94, RR 20, BP 94/66, SPO2 100% on room air.  WBC 20.3, hemoglobin 8.8, platelets 298, MCV 100.8.  Sodium 138, potassium 3.2, chloride 111, CO2 18, glucose 127, BUN 66, creatinine 1.43.  High sensitive troponin 17>16.  FOBT positive.  UDS negative.  Chest x-ray with no active cardiopulmonary disease process.  EtOH level less than 10.  COVID-19 PCR negative.  Influenza A/B PCR negative.  GI consulted.  Hospital service consulted for further evaluation management with concerns for acute GI bleed likely secondary to gastric ulcer from NSAID abuse. ? ?Assessment & Plan: ?  ?Upper GI bleed ?Acute blood loss anemia ?Patient presenting to ED with recurrent episodes of hematemesis in the setting of dark tarry stools with reported NSAID abuse.  No use of anticoagulants/antiplatelets outpatient.  Anemia panel with iron 71, TIBC 274, ferritin 327, folate 19.3, B12 304.  EGD 5/14 with benign-appearing esophageal stenosis, 2 cm hiatal hernia, nonbleeding gastric ulcers with no stigmata of bleeding. ?--[facility] following; appreciate assistance ?--Hgb 11.7>8.8>7.6>6.9>7.4>7.0 ?--s/p 1u pRBC 5/14; will transfuse 1 unit pRBC today ?--Protonix drip ?--Repeat Hgb 2 hours following transfusion and continue to  monitor q6h ?--Consider CTA abdomen/pelvis; but given lack of significant bleeding currently with resolution of hematemesis and no bowel movement, may be nondiagnostic ? ?Nonischemic cardiomyopathy ?Chronic systolic congestive heart failure ?TTE 03/10/2019 with LVEF 17-79%, grade 1 diastolic dysfunction, LV with global hypokinesis, LV mildly dilated, RVSP 31.5 mmHg, mild MR, no aortic stenosis, IVC normal in size.  Home regimen includes amiodarone 200 mg p.o. daily, carvedilol 25 mg p.o. twice daily, digoxin 0.125 mg p.o. daily, furosemide 80 mg M/W/F, isosorbide dinitrate 20 mg p.o. 3 times daily, BiDil 20-37.5 mg daily, losartan 100 mg p.o. daily, spironolactone 25 mg p.o. daily. ?--Continue amiodarone 200 mg p.o. daily ?--Continue digoxin 0.125 mg p.o. daily ?--Holding home carvedilol, furosemide, isosorbide dinitrate, BiDil, losartan, spironolactone due to AKI and borderline hypotension ?--Continue monitor BP closely ? ?Acute renal failure ?Baseline creatinine 0.9-1.2.  On presentation, creatinine elevated 1.44.  Etiology likely secondary to prerenal azotemia in the setting of vomiting versus ATN from hypotension. ?--Cr 1.44>1.31>1.43>1.12>1.16 ?--Holding carvedilol, furosemide, isosorbide dinitrate, BiDil, losartan, spironolactone ?--LR at 100 mL/h ?--Avoid nephrotoxins, renally dose all medications ?--Strict I's and O's ?--BMP daily ? ?Hyperlipidemia ?--Atorvastatin 10 mg p.o. daily ? ?EtOH abuse ?Counseled on need for complete cessation.  EtOH level less than 10. ?--CIWA protocol with symptom triggered Ativan ?--Multivitamin, folate, thiamine ? ? ?DVT prophylaxis: SCDs Start: 05/18/21 0542 ? ?  Code Status: Full Code ?Family Communication: No family present at bedside this morning ? ?Disposition Plan:  ?Level of care: Progressive ?Status is: Inpatient ?Remains inpatient appropriate because: Hemoglobin continued to trend down, receiving additional transfusion today ?  ? ?Consultants:  ?[facility] ? ?Procedures:  ?EGD 5/14; Dr. Loletha Carrow ? ?Antimicrobials:  ?  None ? ? ?Subjective: ?Patient seen examined bedside, resting comfortably.  Sleeping but easily arousable.  He hemoglobin now trending down to 7.0, discussed additional transfusion today.  Remains on Protonix drip.  Denies any further hematemesis, and no bowel movement since admission.  EGD yesterday showed nonbleeding gastric ulcers with no stigmata of bleeding.  No other specific questions or concerns at this time.  Denies headache, no dizziness, no chest pain, no palpitations, no shortness of breath, no cough/congestion, no fever/chills/night sweats, no abdominal pain, no weakness, no fatigue, no paresthesias.  No acute events overnight per nurse staff. ? ?Objective: ?Vitals:  ? 05/19/21 0706 05/19/21 6767 05/19/21 0742 05/19/21 0815  ?BP: (!) 143/71 117/74 108/60 (!) 104/59  ?Pulse: 73  67 68  ?Resp:   18 16  ?Temp:    97.9 ?F (36.6 ?C)  ?TempSrc:    Oral  ?SpO2: 99%  98% 98%  ?Weight:      ?Height:      ? ? ?Intake/Output Summary (Last 24 hours) at 05/19/2021 0924 ?Last data filed at 05/19/2021 0827 ?Gross per 24 hour  ?Intake 2939.56 ml  ?Output 875 ml  ?Net 2064.56 ml  ? ?Filed Weights  ? 05/17/21 2211  ?Weight: 69.6 kg  ? ? ?Examination: ? ?Physical Exam: ?GEN: NAD, alert and oriented x 3, wd/wn ?HEENT: NCAT, PERRL, EOMI, sclera clear, MMM ?PULM: CTAB w/o wheezes/crackles, normal respiratory effort, on room air ?CV: RRR w/o M/G/R ?GI: abd soft, NTND, NABS, no R/G/M ?MSK: no peripheral edema, muscle strength globally intact 5/5 bilateral upper/lower extremities ?NEURO: CN II-XII intact, no focal deficits, sensation to light touch intact ?PSYCH: normal mood/affect ?Integumentary: dry/intact, no rashes or wounds ? ? ? ?Data Reviewed: I have personally reviewed following labs and imaging studies ? ?CBC: ?Recent Labs  ?Lab 05/17/21 ?1055 05/17/21 ?2246 05/18/21 ?2094 05/18/21 ?1545 05/19/21 ?0023 05/19/21 ?7096  ?WBC 18.4* 20.3*  --   --   15.2* 14.7*  ?NEUTROABS  --  15.4*  --   --   --   --   ?HGB 11.7* 8.8* 7.6* 6.9* 7.4* 7.0*  ?HCT 34.5* 26.8* 23.2* 21.0* 24.9* 21.3*  ?MCV 98.9 100.8*  --   --  108.7* 99.5  ?PLT 329 298  --   --  172 227  ? ?Basic Metabolic Panel: ?Recent Labs  ?Lab 05/17/21 ?1055 05/17/21 ?1629 05/17/21 ?2246 05/19/21 ?0023 05/19/21 ?2836  ?NA 138 138 138 139 139  ?K 2.6* 4.5 3.2* 2.9* 3.4*  ?CL 104 111 111 114* 114*  ?CO2 24 19* 18* 20* 21*  ?GLUCOSE 130* 123* 127* 77 82  ?BUN 76* 71* 66* 29* 23*  ?CREATININE 1.44* 1.31* 1.43* 1.13 1.15  ?CALCIUM 9.3 8.4* 8.7* 8.3* 8.4*  ?MG 2.2  --   --  1.7 2.4  ? ?GFR: ?Estimated Creatinine Clearance: 61.6 mL/min (by C-G formula based on SCr of 1.15 mg/dL). ?Liver Function Tests: ?Recent Labs  ?Lab 05/17/21 ?1055  ?AST 19  ?ALT 17  ?ALKPHOS 61  ?BILITOT 0.9  ?PROT 8.1  ?ALBUMIN 3.6  ? ?No results for input(s): LIPASE, AMYLASE in the last 168 hours. ?No results for input(s): AMMONIA in the last 168 hours. ?Coagulation Profile: ?Recent Labs  ?Lab 05/17/21 ?1055  ?INR 1.1  ? ?Cardiac Enzymes: ?No results for input(s): CKTOTAL, CKMB, CKMBINDEX, TROPONINI in the last 168 hours. ?BNP (last 3 results) ?Recent Labs  ?  05/24/20 ?1029  ?PROBNP 180  ? ?HbA1C: ?No results for input(s): HGBA1C in the last 72 hours. ?CBG: ?  No results for input(s): GLUCAP in the last 168 hours. ?Lipid Profile: ?No results for input(s): CHOL, HDL, LDLCALC, TRIG, CHOLHDL, LDLDIRECT in the last 72 hours. ?Thyroid Function Tests: ?No results for input(s): TSH, T4TOTAL, FREET4, T3FREE, THYROIDAB in the last 72 hours. ?Anemia Panel: ?Recent Labs  ?  05/18/21 ?0735 05/18/21 ?1545  ?[id number]  --  304  ?FOLATE  --  19.3  ?FERRITIN 327  --   ?TIBC  --  274  ?IRON  --  71  ?RETICCTPCT  --  4.1*  ? ?Sepsis Labs: ?No results for input(s): PROCALCITON, LATICACIDVEN in the last 168 hours. ? ?Recent Results (from the past 240 hour(s))  ?Resp Panel by RT-PCR (Flu A&B, Covid) Nasopharyngeal Swab     Status: None  ? Collection Time:  05/17/21 11:32 AM  ? Specimen: Nasopharyngeal Swab; Nasopharyngeal(NP) swabs in vial transport medium  ?Result Value Ref Range Status  ? SARS Coronavirus 2 by RT PCR NEGATIVE NEGATIVE Final  ?  Comment: (NOTE) ?SARS-CoV-2 target n

## 2021-05-19 NOTE — Plan of Care (Signed)
?  Problem: Education: ?Goal: Knowledge of General Education information will improve ?Description: Including pain rating scale, medication(s)/side effects and non-pharmacologic comfort measures ?Outcome: Progressing ?  ?Problem: Health Behavior/Discharge Planning: ?Goal: Ability to manage health-related needs will improve ?Outcome: Progressing ?  ?Problem: Clinical Measurements: ?Goal: Ability to maintain clinical measurements within normal limits will improve ?Outcome: Progressing ? ?Problem: Clinical Measurements: ?Goal: Cardiovascular complication will be avoided ?Outcome: Progressing ?  ?Problem: Pain Managment: ?Goal: General experience of comfort will improve ?Outcome: Progressing ?  ?

## 2021-05-20 DIAGNOSIS — K5730 Diverticulosis of large intestine without perforation or abscess without bleeding: Secondary | ICD-10-CM

## 2021-05-20 DIAGNOSIS — I5022 Chronic systolic (congestive) heart failure: Secondary | ICD-10-CM | POA: Diagnosis not present

## 2021-05-20 DIAGNOSIS — D62 Acute posthemorrhagic anemia: Secondary | ICD-10-CM | POA: Diagnosis not present

## 2021-05-20 DIAGNOSIS — K920 Hematemesis: Secondary | ICD-10-CM | POA: Diagnosis not present

## 2021-05-20 DIAGNOSIS — M109 Gout, unspecified: Secondary | ICD-10-CM

## 2021-05-20 DIAGNOSIS — N179 Acute kidney failure, unspecified: Secondary | ICD-10-CM | POA: Diagnosis not present

## 2021-05-20 LAB — CBC
HCT: 25.6 % — ABNORMAL LOW (ref 39.0–52.0)
Hemoglobin: 8.3 g/dL — ABNORMAL LOW (ref 13.0–17.0)
MCH: 32 pg (ref 26.0–34.0)
MCHC: 32.4 g/dL (ref 30.0–36.0)
MCV: 98.8 fL (ref 80.0–100.0)
Platelets: 239 10*3/uL (ref 150–400)
RBC: 2.59 MIL/uL — ABNORMAL LOW (ref 4.22–5.81)
RDW: 14.5 % (ref 11.5–15.5)
WBC: 15.3 10*3/uL — ABNORMAL HIGH (ref 4.0–10.5)
nRBC: 0.3 % — ABNORMAL HIGH (ref 0.0–0.2)

## 2021-05-20 LAB — BASIC METABOLIC PANEL
Anion gap: 8 (ref 5–15)
BUN: 14 mg/dL (ref 6–20)
CO2: 19 mmol/L — ABNORMAL LOW (ref 22–32)
Calcium: 8.7 mg/dL — ABNORMAL LOW (ref 8.9–10.3)
Chloride: 114 mmol/L — ABNORMAL HIGH (ref 98–111)
Creatinine, Ser: 1.06 mg/dL (ref 0.61–1.24)
GFR, Estimated: >60 mL/min (ref >60)
Glucose, Bld: 97 mg/dL (ref 70–99)
Potassium: 3.5 mmol/L (ref 3.5–5.1)
Sodium: 141 mmol/L (ref 135–145)

## 2021-05-20 MED ORDER — ISOSORB DINITRATE-HYDRALAZINE 20-37.5 MG PO TABS
1.0000 | ORAL_TABLET | Freq: Three times a day (TID) | ORAL | Status: DC
  Administered 2021-05-20: 1 via ORAL
  Filled 2021-05-20: qty 1

## 2021-05-20 MED ORDER — PREDNISONE 10 MG PO TABS: ORAL_TABLET | ORAL | 0 refills | Status: AC

## 2021-05-20 MED ORDER — OXYCODONE HCL 5 MG PO TABS
5.0000 mg | ORAL_TABLET | Freq: Four times a day (QID) | ORAL | Status: AC | PRN
  Administered 2021-05-20: 5 mg via ORAL
  Filled 2021-05-20: qty 1

## 2021-05-20 MED ORDER — PANTOPRAZOLE SODIUM 40 MG PO TBEC: DELAYED_RELEASE_TABLET | ORAL | 0 refills | Status: DC

## 2021-05-20 MED ORDER — PREDNISONE 20 MG PO TABS
40.0000 mg | ORAL_TABLET | Freq: Every day | ORAL | Status: DC
  Administered 2021-05-20: 40 mg via ORAL
  Filled 2021-05-20: qty 2

## 2021-05-20 MED ORDER — POTASSIUM CHLORIDE CRYS ER 20 MEQ PO TBCR
40.0000 meq | EXTENDED_RELEASE_TABLET | Freq: Once | ORAL | Status: AC
  Administered 2021-05-20: 40 meq via ORAL
  Filled 2021-05-20: qty 2

## 2021-05-20 MED ORDER — LOSARTAN POTASSIUM 50 MG PO TABS
100.0000 mg | ORAL_TABLET | Freq: Every day | ORAL | Status: DC
  Administered 2021-05-20: 100 mg via ORAL
  Filled 2021-05-20: qty 2

## 2021-05-20 NOTE — Discharge Summary (Signed)
?Physician Discharge Summary  ?Stanley [name] HWK:[id number] DOB: 09-09-1965 DOA: 05/17/2021 ? ?PCP: Gildardo Pounds, NP ? ?Admit date: 05/17/2021 ?Discharge date: 05/20/2021 ? ?Admitted From: Home ?Disposition: Home ? ?Recommendations for Outpatient Follow-up:  ?Follow up with PCP in 1-2 weeks ?Continue Protonix 40 mg p.o. twice daily x1 month followed by once daily ?Continue prednisone taper for acute gouty arthritis right knee ?Continue to encourage avoidance of NSAIDs given gastric ulcers on EGD ?Please obtain CBC in one week to reassess hemoglobin level to ensure stability ? ?Home Health: No ?Equipment/Devices: Has cane at home ? ?Discharge Condition: Stable ?CODE STATUS: Full code ?Diet recommendation: Heart healthy diet ? ?History of present illness: ? ?Stanley [name] is a 56 y.o. male with past medical history significant for nonischemic cardiomyopathy, chronic systolic congestive heart failure, history of ventricular tachycardia s/p AICD, EtOH use disorder who presented to [facility] ED on 5/13 with shortness of breath, chest pain, hematemesis and dark stools.  Patient reports onset since Wednesday.  Chest pain is reported as intermittent, nonexertional.  He reports continued use of ibuprofen for his "gout". ?  ?Patient with previous EGD/colonoscopy in March 2023 for GERD symptoms that was unrevealing other than diverticulosis. ?  ?In the ED, temperature 99.6 ?F, HR 94, RR 20, BP 94/66, SPO2 100% on room air.  WBC 20.3, hemoglobin 8.8, platelets 298, MCV 100.8.  Sodium 138, potassium 3.2, chloride 111, CO2 18, glucose 127, BUN 66, creatinine 1.43.  High sensitive troponin 17>16.  FOBT positive.  UDS negative.  Chest x-ray with no active cardiopulmonary disease process.  EtOH level less than 10.  COVID-19 PCR negative.  Influenza A/B PCR negative.  GI consulted.  Hospital service consulted for further evaluation management with concerns for acute GI bleed likely secondary to gastric ulcer from NSAID  abuse. ? ?Hospital course: ? ?Upper GI bleed ?Acute blood loss anemia ?Patient presenting to ED with recurrent episodes of hematemesis in the setting of dark tarry stools with reported NSAID abuse.  No use of anticoagulants/antiplatelets outpatient.  Anemia panel with iron 71, TIBC 274, ferritin 327, folate 19.3, B12 304.  [facility] was consulted and followed during hospital course.  Underwent EGD 5/14 with benign-appearing esophageal stenosis, 2 cm hiatal hernia, nonbleeding gastric ulcers with no stigmata of bleeding.  Patient was transfused 2 units.  Received during hospitalization with stabilize hemoglobin.  Hemoglobin 8.3 at time of discharge.  Continue Protonix 40 g p.o. twice daily x30 days followed by once daily.  Recommend repeat CBC 1 week to ensure stability of hemoglobin. ?  ?Nonischemic cardiomyopathy ?Chronic systolic congestive heart failure ?TTE 03/10/2019 with LVEF 94-58%, grade 1 diastolic dysfunction, LV with global hypokinesis, LV mildly dilated, RVSP 31.5 mmHg, mild MR, no aortic stenosis, IVC normal in size.  Home regimen includes amiodarone 200 mg p.o. daily, carvedilol 25 mg p.o. twice daily, digoxin 0.125 mg p.o. daily, furosemide 80 mg M/W/F, isosorbide dinitrate 20 mg p.o. 3 times daily, BiDil 20-37.5 mg daily, losartan 100 mg p.o. daily, spironolactone 25 mg p.o. daily.  Outpatient follow-up with cardiology, heart failure service. ?  ?Acute renal failure: Resolved ?Baseline creatinine 0.9-1.2.  On presentation, creatinine elevated 1.44.  Etiology likely secondary to prerenal azotemia in the setting of vomiting versus ATN from hypotension.  Patient's antihypertensives were initially held due to borderline hypotension in the setting of acute blood loss anemia as above.  Patient was supported with IV fluid hydration with improvement of blood pressure and creatinine to 1.06 at time of discharge.  May  resume home and hypertensive regimen.  Recommend repeat BMP 1 week. ?  ?Acute gouty  arthritis ?Patient complaining of right knee pain with edema on exam.  Will avoid NSAIDs with GI bleed/gastric ulcers noted on EGD.  Started on prednisone 40 mg p.o. daily, will continue for 7 days followed by taper.  Patient reports allopurinol not effective.  Outpatient follow-up with PCP, consideration of Uloric for chronic management of his gout. ?  ?Hyperlipidemia ?Continue atorvastatin 10 mg p.o. daily ?  ?EtOH abuse ?Counseled on need for complete cessation.  EtOH level less than 10 on admission. ? ?Discharge Diagnoses:  ?Principal Problem: ?  Hematemesis ?Active Problems: ?  ABLA (acute blood loss anemia) ?  AKI (acute kidney injury) ([place]) ?  Essential hypertension ?  ETOH abuse ?  Chronic systolic heart failure ([place]) ?  Diverticulosis of colon without hemorrhage ? ? ? ?Discharge Instructions ? ?Discharge Instructions   ? ? Call MD for:  difficulty breathing, headache or visual disturbances   Complete by: As directed ?  ? Call MD for:  extreme fatigue   Complete by: As directed ?  ? Call MD for:  persistant dizziness or light-headedness   Complete by: As directed ?  ? Call MD for:  persistant nausea and vomiting   Complete by: As directed ?  ? Call MD for:  severe uncontrolled pain   Complete by: As directed ?  ? Call MD for:  temperature >100.4   Complete by: As directed ?  ? Diet - low sodium heart healthy   Complete by: As directed ?  ? Increase activity slowly   Complete by: As directed ?  ? ?  ? ?Allergies as of 05/20/2021   ? ?   Reactions  ? Lisinopril Shortness Of Breath  ? Makes lips swell  ? Allopurinol Nausea Only  ? dizziness  ? Apple Juice Swelling  ? Causes Gout to flare up   ? Entresto [sacubitril-valsartan]   ? History of angioedema  ? Other Swelling  ? Nuts - Lip Swelling  ? Shellfish Allergy Swelling  ? Lip Swelling  ? ?  ? ?  ?Medication List  ?  ? ?STOP taking these medications   ? ?ibuprofen 200 MG tablet ?Commonly known as: ADVIL ?  ? ?  ? ?TAKE these medications   ? ?allopurinol 100 MG  tablet ?Commonly known as: ZYLOPRIM ?Take 1 tablet (100 mg total) by mouth daily. FOR GOUT ?  ?amiodarone 200 MG tablet ?Commonly known as: PACERONE ?Take 1 tablet (200 mg total) by mouth daily. ?  ?atorvastatin 20 MG tablet ?Commonly known as: LIPITOR ?Take 1 tablet (20 mg total) by mouth daily. ?  ?carvedilol 25 MG tablet ?Commonly known as: COREG ?TAKE 1 TABLET(25 MG) BY MOUTH TWICE DAILY WITH A MEAL ?What changed: See the new instructions. ?  ?digoxin 0.125 MG tablet ?Commonly known as: LANOXIN ?Take 0.5 tablets (0.0625 mg total) by mouth daily. ?  ?furosemide 80 MG tablet ?Commonly known as: LASIX ?TAKE 1 TABLET BY MOUTH ON MONDAY, WEDNESDAY& FRIDAY. MAY TAKE 1 TABLET AS NEEDED FOR SWELLING ?What changed:  ?how much to take ?how to take this ?when to take this ?additional instructions ?  ?isosorbide dinitrate 20 MG tablet ?Commonly known as: ISORDIL ?Take 20 mg by mouth 3 (three) times daily. ?  ?isosorbide-hydrALAZINE 20-37.5 MG tablet ?Commonly known as: BIDIL ?TAKE 1 TABLET BY MOUTH THREE TIMES DAILY ?What changed: when to take this ?  ?losartan 100 MG tablet ?Commonly  known as: COZAAR ?Take 1 tablet (100 mg total) by mouth daily. ?  ?nitroGLYCERIN 0.4 MG SL tablet ?Commonly known as: NITROSTAT ?Place 1 tablet (0.4 mg total) under the tongue every 5 (five) minutes x 3 doses as needed for chest pain. ?  ?pantoprazole 40 MG tablet ?Commonly known as: Protonix ?Take 1 tablet (40 mg total) by mouth 2 (two) times daily for 30 days, THEN 1 tablet (40 mg total) daily. ?Start taking on: May 20, 2021 ?What changed: See the new instructions. ?  ?potassium chloride SA 20 MEQ tablet ?Commonly known as: KLOR-CON M ?Take 3 tablets (60 mEq total) by mouth daily. ?What changed: how much to take ?  ?predniSONE 10 MG tablet ?Commonly known as: DELTASONE ?Take 4 tablets (40 mg total) by mouth daily for 7 days, THEN 3 tablets (30 mg total) daily for 2 days, THEN 2 tablets (20 mg total) daily for 2 days, THEN 1 tablet (10 mg  total) daily for 2 days, THEN 0.5 tablets (5 mg total) daily for 2 days. ?Start taking on: May 21, 2021 ?  ?sodium chloride 0.65 % Soln nasal spray ?Commonly known as: OCEAN ?Place 1 spray into both nostrils daily as needed f

## 2021-05-20 NOTE — Progress Notes (Signed)
Pt to follow up on Isordil and Bidil with Doctor ?

## 2021-05-20 NOTE — Progress Notes (Signed)
?PROGRESS NOTE ? ? ? ?Stanley [name]  IBB:[id number] DOB: August 01, 1965 DOA: 05/17/2021 ?PCP: Gildardo Pounds, NP  ? ? ?Brief Narrative:  ? ?Stanley [name] is a 56 y.o. male with past medical history significant for nonischemic cardiomyopathy, chronic systolic congestive heart failure, history of ventricular tachycardia s/p AICD, EtOH use disorder who presented to [facility] ED on 5/13 with shortness of breath, chest pain, hematemesis and dark stools.  Patient reports onset since Wednesday.  Chest pain is reported as intermittent, nonexertional.  He reports continued use of ibuprofen for his "gout". ? ?Patient with previous EGD/colonoscopy in March 2023 for GERD symptoms that was unrevealing. ? ?In the ED, temperature 99.6 ?F, HR 94, RR 20, BP 94/66, SPO2 100% on room air.  WBC 20.3, hemoglobin 8.8, platelets 298, MCV 100.8.  Sodium 138, potassium 3.2, chloride 111, CO2 18, glucose 127, BUN 66, creatinine 1.43.  High sensitive troponin 17>16.  FOBT positive.  UDS negative.  Chest x-ray with no active cardiopulmonary disease process.  EtOH level less than 10.  COVID-19 PCR negative.  Influenza A/B PCR negative.  GI consulted.  Hospital service consulted for further evaluation management with concerns for acute GI bleed likely secondary to gastric ulcer from NSAID abuse. ? ?Assessment & Plan: ?  ?Upper GI bleed ?Acute blood loss anemia ?Patient presenting to ED with recurrent episodes of hematemesis in the setting of dark tarry stools with reported NSAID abuse.  No use of anticoagulants/antiplatelets outpatient.  Anemia panel with iron 71, TIBC 274, ferritin 327, folate 19.3, B12 304.  EGD 5/14 with benign-appearing esophageal stenosis, 2 cm hiatal hernia, nonbleeding gastric ulcers with no stigmata of bleeding. ?--[facility] following; appreciate assistance ?--Hgb 11.7>8.8>7.6>6.9>7.4>7.0>9.3>8.9>8.3 ?--s/p 1u pRBC 5/14 and 1u pRBC 5/15 ?--Protonix drip ?--Await further recommendations per GI, anticipate likely discharge  home this afternoon if no further concerns ? ?Nonischemic cardiomyopathy ?Chronic systolic congestive heart failure ?TTE 03/10/2019 with LVEF 45-03%, grade 1 diastolic dysfunction, LV with global hypokinesis, LV mildly dilated, RVSP 31.5 mmHg, mild MR, no aortic stenosis, IVC normal in size.  Home regimen includes amiodarone 200 mg p.o. daily, carvedilol 25 mg p.o. twice daily, digoxin 0.125 mg p.o. daily, furosemide 80 mg M/W/F, isosorbide dinitrate 20 mg p.o. 3 times daily, BiDil 20-37.5 mg daily, losartan 100 mg p.o. daily, spironolactone 25 mg p.o. daily. ?--Continue amiodarone 200 mg p.o. daily ?--Continue digoxin 0.125 mg p.o. daily ?--Restart by daily, losartan today ?--Holding home carvedilol, furosemide, isosorbide dinitrate, spironolactone due to AKI and borderline hypotension ?--Continue monitor BP closely ? ?Acute renal failure: Resolved ?Baseline creatinine 0.9-1.2.  On presentation, creatinine elevated 1.44.  Etiology likely secondary to prerenal azotemia in the setting of vomiting versus ATN from hypotension. ?--Cr 1.44>1.31>1.43>1.12>1.16>1.06 ?--Holding carvedilol, furosemide, isosorbide dinitrate, BiDil, losartan, spironolactone ?--LR at 100 mL/h ?--Avoid nephrotoxins, renally dose all medications ?--Strict I's and O's ?--BMP daily ? ?Acute gouty arthritis ?Patient complaining of right knee pain with edema on exam.  Will avoid NSAIDs with GI bleed/gastric ulcers noted on EGD. ?--Start prednisone 40 mg p.o. daily ? ?Hyperlipidemia ?--Atorvastatin 10 mg p.o. daily ? ?EtOH abuse ?Counseled on need for complete cessation.  EtOH level less than 10. ?--CIWA protocol with symptom triggered Ativan ?--Multivitamin, folate, thiamine ? ? ?DVT prophylaxis: SCDs Start: 05/18/21 0542 ? ?  Code Status: Full Code ?Family Communication: No family present at bedside this morning ? ?Disposition Plan:  ?Level of care: Progressive ?Status is: Inpatient ?Remains inpatient appropriate because: Awaiting further GI  recommendations, possible discharge home today ?  ? ?  Consultants:  ?[facility] ? ?Procedures:  ?EGD 5/14; Dr. Loletha Carrow ? ?Antimicrobials:  ?None ? ? ?Subjective: ?Patient seen examined bedside, resting comfortably.  Complaining about right knee pain.  Concern for acute gout flare, discussed with patient needs to avoid NSAIDs given gastric ulcers and will start prednisone today.  No other specific questions or concerns at this time. Denies headache, no dizziness, no chest pain, no palpitations, no shortness of breath, no cough/congestion, no fever/chills/night sweats, no abdominal pain, no weakness, no fatigue, no paresthesias.  No acute events overnight per nurse staff. ? ?Objective: ?Vitals:  ? 05/19/21 1906 05/19/21 2315 05/20/21 0420 05/20/21 0855  ?BP: 129/72 (!) 148/77 127/67   ?Pulse: 89 67 65 72  ?Resp: '18 19 19   '$ ?Temp: 98.6 ?F (37 ?C) 98.1 ?F (36.7 ?C) 98.4 ?F (36.9 ?C)   ?TempSrc: Oral Oral Oral   ?SpO2: 99% 100% 98%   ?Weight:      ?Height:      ? ? ?Intake/Output Summary (Last 24 hours) at 05/20/2021 1307 ?Last data filed at 05/20/2021 5631 ?Gross per 24 hour  ?Intake 570 ml  ?Output 875 ml  ?Net -305 ml  ? ?Filed Weights  ? 05/17/21 2211  ?Weight: 69.6 kg  ? ? ?Examination: ? ?Physical Exam: ?GEN: NAD, alert and oriented x 3, wd/wn ?HEENT: NCAT, PERRL, EOMI, sclera clear, MMM ?PULM: CTAB w/o wheezes/crackles, normal respiratory effort, on room air ?CV: RRR w/o M/G/R ?GI: abd soft, NTND, NABS, no R/G/M ?MSK: Tenderness to palpation with edema right knee, normal active/passive range of motion, muscle strength globally intact bilateral upper/lower extremities, no peripheral edema ?NEURO: CN II-XII intact, no focal deficits, sensation to light touch intact ?PSYCH: normal mood/affect ?Integumentary: dry/intact, no rashes or wounds ? ? ? ?Data Reviewed: I have personally reviewed following labs and imaging studies ? ?CBC: ?Recent Labs  ?Lab 05/17/21 ?1055 05/17/21 ?2246 05/18/21 ?4970 05/19/21 ?0023  05/19/21 ?2637 05/19/21 ?1621 05/19/21 ?2009 05/20/21 ?0404  ?WBC 18.4* 20.3*  --  15.2* 14.7*  --   --  15.3*  ?NEUTROABS  --  15.4*  --   --   --   --   --   --   ?HGB 11.7* 8.8*   < > 7.4* 7.0* 9.3* 8.9* 8.3*  ?HCT 34.5* 26.8*   < > 24.9* 21.3* 28.2* 27.4* 25.6*  ?MCV 98.9 100.8*  --  108.7* 99.5  --   --  98.8  ?PLT 329 298  --  172 227  --   --  239  ? < > = values in this interval not displayed.  ? ?Basic Metabolic Panel: ?Recent Labs  ?Lab 05/17/21 ?1055 05/17/21 ?1629 05/17/21 ?2246 05/19/21 ?0023 05/19/21 ?8588 05/20/21 ?0404  ?NA 138 138 138 139 139 141  ?K 2.6* 4.5 3.2* 2.9* 3.4* 3.5  ?CL 104 111 111 114* 114* 114*  ?CO2 24 19* 18* 20* 21* 19*  ?GLUCOSE 130* 123* 127* 77 82 97  ?BUN 76* 71* 66* 29* 23* 14  ?CREATININE 1.44* 1.31* 1.43* 1.13 1.15 1.06  ?CALCIUM 9.3 8.4* 8.7* 8.3* 8.4* 8.7*  ?MG 2.2  --   --  1.7 2.4  --   ? ?GFR: ?Estimated Creatinine Clearance: 66.9 mL/min (by C-G formula based on SCr of 1.06 mg/dL). ?Liver Function Tests: ?Recent Labs  ?Lab 05/17/21 ?1055  ?AST 19  ?ALT 17  ?ALKPHOS 61  ?BILITOT 0.9  ?PROT 8.1  ?ALBUMIN 3.6  ? ?No results for input(s): LIPASE, AMYLASE in the last 168  hours. ?No results for input(s): AMMONIA in the last 168 hours. ?Coagulation Profile: ?Recent Labs  ?Lab 05/17/21 ?1055  ?INR 1.1  ? ?Cardiac Enzymes: ?No results for input(s): CKTOTAL, CKMB, CKMBINDEX, TROPONINI in the last 168 hours. ?BNP (last 3 results) ?Recent Labs  ?  05/24/20 ?1029  ?PROBNP 180  ? ?HbA1C: ?No results for input(s): HGBA1C in the last 72 hours. ?CBG: ?No results for input(s): GLUCAP in the last 168 hours. ?Lipid Profile: ?No results for input(s): CHOL, HDL, LDLCALC, TRIG, CHOLHDL, LDLDIRECT in the last 72 hours. ?Thyroid Function Tests: ?No results for input(s): TSH, T4TOTAL, FREET4, T3FREE, THYROIDAB in the last 72 hours. ?Anemia Panel: ?Recent Labs  ?  05/18/21 ?0735 05/18/21 ?1545  ?UKGURKYH06  --  304  ?FOLATE  --  19.3  ?FERRITIN 327  --   ?TIBC  --  274  ?IRON  --  71  ?RETICCTPCT   --  4.1*  ? ?Sepsis Labs: ?No results for input(s): PROCALCITON, LATICACIDVEN in the last 168 hours. ? ?Recent Results (from the past 240 hour(s))  ?Resp Panel by RT-PCR (Flu A&B, Covid) Nasopharyngeal Swab

## 2021-05-20 NOTE — Progress Notes (Signed)
CSW received consult for substance use. CSW met with patient at bedside. CSW offered patient outpatient substance use treatment services resources. Patient accepted. All questions answered. No further questions reported at this time. 

## 2021-05-21 ENCOUNTER — Telehealth: Payer: Self-pay

## 2021-05-21 NOTE — Telephone Encounter (Signed)
Transition Care Management Unsuccessful Follow-up Telephone Call ? ?Date of discharge and from where:  05/20/2021, [facility]  ? ?Attempts:  1st Attempt ? ?Reason for unsuccessful TCM follow-up call:  Left voice message on # [phone].  Call back requested.  ? ? ? ?

## 2021-05-22 ENCOUNTER — Telehealth: Payer: Self-pay | Admitting: Nurse Practitioner

## 2021-05-22 ENCOUNTER — Telehealth: Payer: Self-pay

## 2021-05-22 ENCOUNTER — Other Ambulatory Visit: Payer: Self-pay | Admitting: *Deleted

## 2021-05-22 ENCOUNTER — Other Ambulatory Visit: Payer: Self-pay | Admitting: Nurse Practitioner

## 2021-05-22 DIAGNOSIS — I10 Essential (primary) hypertension: Secondary | ICD-10-CM

## 2021-05-22 LAB — BPAM RBC
Blood Product Expiration Date: 202306062359
Blood Product Expiration Date: 202306072359
Blood Product Expiration Date: 202306072359
ISSUE DATE / TIME: 202305141823
ISSUE DATE / TIME: 202305151039
Unit Type and Rh: 5100
Unit Type and Rh: 5100
Unit Type and Rh: 5100

## 2021-05-22 LAB — TYPE AND SCREEN
ABO/RH(D): O POS
Antibody Screen: NEGATIVE
Unit division: 0
Unit division: 0
Unit division: 0

## 2021-05-22 MED ORDER — ISOSORBIDE DINITRATE 20 MG PO TABS: 20.0000 mg | ORAL_TABLET | Freq: Three times a day (TID) | ORAL | 0 refills | Status: DC

## 2021-05-22 NOTE — Telephone Encounter (Signed)
The patient has returned missed call and requested to be contacted if/when possible

## 2021-05-22 NOTE — Telephone Encounter (Signed)
Transition Care Management Follow-up Telephone Call Date of discharge and from where: 05/20/2021, [facility] How have you been since you were released from the hospital? He said he is a lot better than when he was in the hospital but is still weak and has occasional headaches.  Any questions or concerns? Yes- he said he is worried about side effects from pantoprazole. He understands it is for his stomach but he said it is not helping and he is concerned that it will aggravate his stomach problems.   Items Reviewed: Did the pt receive and understand the discharge instructions provided? Yes  Medications obtained and verified? Yes  - he said he has all of his medications and did not have any questions about the med regime.  Other? No  Any new allergies since your discharge? No  Dietary orders reviewed? Yes Do you have support at home?  Lives alone but his sister to comes to check on him occasionally.  Home Care and Equipment/Supplies: Were home health services ordered? no If so, what is the name of the agency? N/a  Has the agency set up a time to come to the patient's home? not applicable Were any new equipment or medical supplies ordered?  No What is the name of the medical supply agency? N/a Were you able to get the supplies/equipment? not applicable Do you have any questions related to the use of the equipment or supplies? No  Functional Questionnaire: (I = Independent and D = Dependent) ADLs: independent  Follow up appointments reviewed:  PCP Hospital f/u appt confirmed? Yes  Scheduled to see [name], NP - 05/28/2021.  [facility] f/u appt confirmed? Yes  Scheduled to see oncology- 05/29/2021. Are transportation arrangements needed? No  If their condition worsens, is the pt aware to call PCP or go to the Emergency Dept.? Yes Was the patient provided with contact information for the PCP's office or ED? Yes Was to pt encouraged to call back with questions or  concerns? Yes

## 2021-05-22 NOTE — Telephone Encounter (Signed)
Call returned to patient # [phone] left with call back requested

## 2021-05-22 NOTE — Telephone Encounter (Signed)
Transition Care Management Unsuccessful Follow-up Telephone Call  Date of discharge and from where:  05/20/2021, [facility]   Attempts:  2nd Attempt  Reason for unsuccessful TCM follow-up call:  Left voice message on # [phone].  Call back requested.   Patient has an appointment at [facility] with [name], NP - 06/23/2021.

## 2021-05-22 NOTE — Telephone Encounter (Signed)
Copied from [place] [phone]. Topic: General - Other >> May 22, 2021  2:14 PM Tessa Lerner A wrote: Reason for CRM: The patient has returned a missed call from [place]   Please contact again when possible

## 2021-05-23 NOTE — Telephone Encounter (Signed)
Requested Prescriptions  Pending Prescriptions Disp Refills  . carvedilol (COREG) 25 MG tablet [Pharmacy Med Name: CARVEDILOL '25MG'$  TABLETS] 180 tablet 0    Sig: TAKE 1 TABLET(25 MG) BY MOUTH TWICE DAILY WITH A MEAL     Cardiovascular: Beta Blockers 3 Passed - 05/22/2021  7:05 PM      Passed - Cr in normal range and within 360 days    Creatinine  Date Value Ref Range Status  05/01/2021 1.18 0.61 - 1.24 mg/dL Final   Creatinine, Ser  Date Value Ref Range Status  05/20/2021 1.06 0.61 - 1.24 mg/dL Final         Passed - AST in normal range and within 360 days    AST  Date Value Ref Range Status  05/17/2021 19 15 - 41 U/L Final  05/01/2021 28 15 - 41 U/L Final         Passed - ALT in normal range and within 360 days    ALT  Date Value Ref Range Status  05/17/2021 17 0 - 44 U/L Final  05/01/2021 21 0 - 44 U/L Final         Passed - Last BP in normal range    BP Readings from Last 1 Encounters:  05/20/21 127/67         Passed - Last Heart Rate in normal range    Pulse Readings from Last 1 Encounters:  05/20/21 72         Passed - Valid encounter within last 6 months    Recent Outpatient Visits          4 months ago Essential hypertension   [place], Vernia Buff, NP   7 months ago Essential hypertension   [place], Vernia Buff, NP   1 year ago Insomnia secondary to depression with anxiety   [place] Gildardo Pounds, NP   1 year ago Essential hypertension   [place], NP   2 years ago Nonischemic cardiomyopathy [facility])   [place], MD      Future Appointments            In 5 days Gildardo Pounds, NP [place]

## 2021-05-24 NOTE — Telephone Encounter (Signed)
Opal Sidles,  Please have Mr Gassett reach out to GI regarding the pantoprazole. Thanks

## 2021-05-26 NOTE — Telephone Encounter (Signed)
I have spoken to the patient since he called 4 days ago

## 2021-05-26 NOTE — Telephone Encounter (Signed)
I called patient # [phone] to inform him that Ms Raul Del, NP recommends that he contact GI with her concerns about the pantoprazole. Message left with call back requested to this CM

## 2021-05-27 NOTE — Telephone Encounter (Signed)
I attempted again to contact patient # [phone] to inform him that Ms Raul Del, NP recommends that he contact GI with her concerns about the pantoprazole. Message left with call back requested to this CM. The patient has an appointment with Ms Raul Del, NP tomorrow - 05/28/2021 @ [place]

## 2021-05-28 ENCOUNTER — Ambulatory Visit: Payer: Medicaid Other | Attending: Nurse Practitioner | Admitting: Nurse Practitioner

## 2021-05-28 ENCOUNTER — Telehealth: Payer: Self-pay | Admitting: Student

## 2021-05-28 ENCOUNTER — Encounter: Payer: Self-pay | Admitting: Nurse Practitioner

## 2021-05-28 VITALS — BP 111/69 | HR 68 | Ht 64.5 in | Wt 156.0 lb

## 2021-05-28 DIAGNOSIS — I10 Essential (primary) hypertension: Secondary | ICD-10-CM | POA: Diagnosis not present

## 2021-05-28 DIAGNOSIS — Z09 Encounter for follow-up examination after completed treatment for conditions other than malignant neoplasm: Secondary | ICD-10-CM

## 2021-05-28 DIAGNOSIS — F331 Major depressive disorder, recurrent, moderate: Secondary | ICD-10-CM | POA: Diagnosis not present

## 2021-05-28 DIAGNOSIS — M1A09X0 Idiopathic chronic gout, multiple sites, without tophus (tophi): Secondary | ICD-10-CM | POA: Diagnosis not present

## 2021-05-28 DIAGNOSIS — F5105 Insomnia due to other mental disorder: Secondary | ICD-10-CM

## 2021-05-28 DIAGNOSIS — F418 Other specified anxiety disorders: Secondary | ICD-10-CM

## 2021-05-28 DIAGNOSIS — D649 Anemia, unspecified: Secondary | ICD-10-CM

## 2021-05-28 MED ORDER — PAROXETINE HCL 20 MG PO TABS: 20.0000 mg | ORAL_TABLET | Freq: Every day | ORAL | 1 refills | Status: DC

## 2021-05-28 MED ORDER — TRAZODONE HCL 150 MG PO TABS: ORAL_TABLET | ORAL | 1 refills | Status: DC

## 2021-05-28 MED ORDER — ALLOPURINOL 200 MG PO TABS: 200.0000 mg | ORAL_TABLET | Freq: Every day | ORAL | 1 refills | Status: DC

## 2021-05-28 NOTE — Telephone Encounter (Signed)
Pt c/o medication issue:  1. Name of Medication: isosorbide dinitrate (ISORDIL) 20 MG tablet isosorbide-hydrALAZINE (BIDIL) 20-37.5 MG tablet  2. How are you currently taking this medication (dosage and times per day)? Take 1 tablet (20 mg total) by mouth 3 (three) times daily. TAKE 1 TABLET BY MOUTH THREE TIMES DAILYPatient taking differently: Take 1 tablet by mouth daily.  3. Are you having a reaction (difficulty breathing--STAT)?   4. What is your medication issue? She wants to make sure patient is to be on both of these medication since the both have isosorbide in them.  She said the best way to reach her would be through secure chat.

## 2021-05-28 NOTE — Telephone Encounter (Signed)
Discussed with HF clinic.  Pt should only be on Bidil.  Isordil removed from med list.  NP aware.

## 2021-05-28 NOTE — Progress Notes (Addendum)
Assessment & Plan:  Stanley [name] was seen today for hospitalization follow-up.  Diagnoses and all orders for this visit:  Hospital discharge follow-up  Essential hypertension -     CMP14+EGFR  Chronic gout of multiple sites, unspecified cause -     allopurinol 200 MG TABS; Take 200 mg by mouth daily. FOR GOUT  Moderate episode of recurrent major depressive disorder ([place]) -     Ambulatory referral to [facility] -     PARoxetine (PAXIL) 20 MG tablet; Take 1 tablet (20 mg total) by mouth daily.  Insomnia secondary to depression with anxiety -     traZODone (DESYREL) 150 MG tablet; TAKE 1 TABLET(150 MG) BY MOUTH AT BEDTIME  Anemia, unspecified type -     CBC with Differential    Patient has been counseled on age-appropriate routine health concerns for screening and prevention. These are reviewed and up-to-date. Referrals have been placed accordingly. Immunizations are up-to-date or declined.    Subjective:   Chief Complaint  Patient presents with   Hospitalization Follow-up   HPI Stanley [name] 56 y.o. male presents to office today for  [place].   Admitted to the hospital from 05/17/2021 through 05/20/2021. Past medical history significant for nonischemic cardiomyopathy, chronic systolic congestive heart failure, history of ventricular tachycardia s/p AICD, EtOH use disorder.   Patient presented for shortness of breath, chest pain hematemesis and dark stools.  FOBT positive.  EGD with benign-appearing esophageal stenosis, 2 cm hiatal hernia, nonbleeding gastric ulcer.  Hemoglobin 8.8 and patient was transfused 2 units of PRBC.  He was instructed to continue pantoprazole 40 mg twice daily for 30 days and then decrease to once daily thereafter (he is aware).  Hospital course was complicated by acute renal insufficiency with elevated creatinine of 1.44 and baseline 0.9-1.2.  He was treated with IV fluid hydration and creatinine stabilized to 1.06 at time of  discharge. He was started on prednisone for acute gout flare of the right knee and instructed to avoid NSAIDs.  HTN Home regimen includes amiodarone 200 mg p.o. daily, carvedilol 25 mg p.o. twice daily, digoxin 0.125 mg p.o. daily, furosemide 80 mg M/W/F, isosorbide dinitrate 20 mg p.o. 3 times daily, BiDil 20-37.5 mg daily, losartan 100 mg p.o. daily, spironolactone 25 mg p.o. daily.  Patient did not have his losartan or spironolactone with him in office today.  BP Readings from Last 3 Encounters:  05/28/21 111/69  05/20/21 127/67  05/17/21 111/70    Chronic Gout Uric acid not quite at goal. Will increase allopurinol to 200 mg daily. He mostly has pain in his right knee. Currently taking prednisone for flare Lab Results  Component Value Date   LABURIC 6.8 05/19/2021     Anxiety and Depression He is currently taking trazodone for insomnia related to depression.  Went out of the paroxetine but states it was effective when he was taking it.  We will resume today at 20 mg daily.  He is also requesting referral for psychotherapy.  Depression is mainly due to current chronic health issues and finances. Review of Systems  Constitutional:  Negative for fever, malaise/fatigue and weight loss.  HENT: Negative.  Negative for nosebleeds.   Eyes: Negative.  Negative for blurred vision, double vision and photophobia.  Respiratory: Negative.  Negative for cough and shortness of breath.   Cardiovascular: Negative.  Negative for chest pain, palpitations and leg swelling.  Gastrointestinal: Negative.  Negative for heartburn, nausea and vomiting.  Musculoskeletal:  Positive for joint pain.  Negative for myalgias.  Neurological: Negative.  Negative for dizziness, focal weakness, seizures and headaches.  Psychiatric/Behavioral:  Positive for depression. Negative for suicidal ideas. The patient is nervous/anxious.    Past Medical History:  Diagnosis Date   Acute renal failure (ARF) ([place])    Acute  respiratory failure with hypoxia ([place]) 12/29/2018   Alcohol abuse    Anxiety    CAD (coronary artery disease)    a. dx unclear, reported PCI in 2011 but cath 2014 normal coronaries and cardiac CT 07/2016 normal coronaries, no mention of stents.   Chronic chest pain    Chronic systolic CHF (congestive heart failure) ([place])    Depression    Dyspnea    07/05/2019: per patient some times with exertion, "has to catch breath"   Financial difficulties    Gout    Gouty arthritis    Headache    History of noncompliance with medical treatment    financial challenges   Hypertension    ICD (implantable cardioverter-defibrillator) in place    [place] [employer] ICD implant done in [place] 2015   Insomnia    Lobar pneumonia ([place]) 12/28/2018   Myocardial infarction ([place]) 2005   Nonischemic cardiomyopathy ([place])    Sleep apnea    per patient, has CPAP does not wear it every night   Ventricular tachycardia ([place]) 01/2015   2017 - ATP delivered for sinus tach, degenerated into VT for which he received shock. Recurrent VT 03/2018.    Past Surgical History:  Procedure Laterality Date   BAROREFLEX SYSTEM INSERTION     BIOPSY  03/06/2021   Procedure: BIOPSY;  Surgeon: Yetta Flock, MD;  [facility];  Service: Gastroenterology;;   CARDIAC DEFIBRILLATOR PLACEMENT  06/05/2013   [employer] Inogen ICD implanted at [facility] in [place] for primary prevention   COLONOSCOPY WITH PROPOFOL N/A 03/06/2021   Procedure: COLONOSCOPY WITH PROPOFOL;  Surgeon: Yetta Flock, MD;  [facility];  Service: Gastroenterology;  Laterality: N/A;   ESOPHAGEAL DILATION  03/06/2021   Procedure: ESOPHAGEAL DILATION;  Surgeon: Yetta Flock, MD;  [facility];  Service: Gastroenterology;;   ESOPHAGOGASTRODUODENOSCOPY (EGD) WITH PROPOFOL N/A 03/06/2021   Procedure: ESOPHAGOGASTRODUODENOSCOPY (EGD) WITH PROPOFOL;  Surgeon: Yetta Flock, MD;  [facility];  Service:  Gastroenterology;  Laterality: N/A;   ESOPHAGOGASTRODUODENOSCOPY (EGD) WITH PROPOFOL N/A 05/18/2021   Procedure: ESOPHAGOGASTRODUODENOSCOPY (EGD) WITH PROPOFOL;  Surgeon: Doran Stabler, MD;  Location: [facility];  Service: Gastroenterology;  Laterality: N/A;   HERNIA REPAIR     PERCUTANEOUS CORONARY STENT INTERVENTION (PCI-S)  01/05/2009   POLYPECTOMY  03/06/2021   Procedure: POLYPECTOMY;  Surgeon: Yetta Flock, MD;  [facility];  Service: Gastroenterology;;    Family History  Problem Relation Age of Onset   Breast cancer Mother    Pancreatic cancer Mother    Cancer Mother        Pancreatic   Hypertension Father    Alzheimer's disease Father    Gout Father    Dementia Father    Hypertension Sister    Clotting disorder Sister    Obesity Brother    Bronchitis Brother    Heart disease Maternal Grandmother    Gout Paternal Grandfather    Colon polyps Neg Hx    Colon cancer Neg Hx    Esophageal cancer Neg Hx    Stomach cancer Neg Hx     Social History Reviewed with no changes to be made today.  Outpatient Medications Prior to Visit  Medication Sig Dispense Refill   amiodarone (PACERONE) 200 MG tablet Take 1 tablet (200 mg total) by mouth daily. 21 tablet 0   Ascorbic Acid (VITAMIN C) 1000 MG tablet Take 1,000 mg by mouth daily.     atorvastatin (LIPITOR) 20 MG tablet Take 1 tablet (20 mg total) by mouth daily. 90 tablet 3   carvedilol (COREG) 25 MG tablet TAKE 1 TABLET(25 MG) BY MOUTH TWICE DAILY WITH A MEAL 180 tablet 0   digoxin (LANOXIN) 0.125 MG tablet Take 0.5 tablets (0.0625 mg total) by mouth daily. 45 tablet 2   furosemide (LASIX) 80 MG tablet TAKE 1 TABLET BY MOUTH ON MONDAY, WEDNESDAY& FRIDAY. MAY TAKE 1 TABLET AS NEEDED FOR SWELLING (Patient taking differently: Take 80 mg by mouth every Monday, Wednesday, and Friday.) 45 tablet 2   isosorbide dinitrate (ISORDIL) 20 MG tablet Take 1 tablet (20 mg total) by mouth 3 (three) times daily. 90 tablet 0    isosorbide-hydrALAZINE (BIDIL) 20-37.5 MG tablet TAKE 1 TABLET BY MOUTH THREE TIMES DAILY (Patient taking differently: Take 1 tablet by mouth daily.) 270 tablet 1   losartan (COZAAR) 100 MG tablet Take 1 tablet (100 mg total) by mouth daily. 30 tablet 6   nitroGLYCERIN (NITROSTAT) 0.4 MG SL tablet Place 1 tablet (0.4 mg total) under the tongue every 5 (five) minutes x 3 doses as needed for chest pain. 30 tablet 6   pantoprazole (PROTONIX) 40 MG tablet Take 1 tablet (40 mg total) by mouth 2 (two) times daily for 30 days, THEN 1 tablet (40 mg total) daily. 150 tablet 0   potassium chloride SA (KLOR-CON) 20 MEQ tablet Take 3 tablets (60 mEq total) by mouth daily. (Patient taking differently: Take 40 mEq by mouth daily.) 180 tablet 3   predniSONE (DELTASONE) 10 MG tablet Take 4 tablets (40 mg total) by mouth daily for 7 days, THEN 3 tablets (30 mg total) daily for 2 days, THEN 2 tablets (20 mg total) daily for 2 days, THEN 1 tablet (10 mg total) daily for 2 days, THEN 0.5 tablets (5 mg total) daily for 2 days. 41 tablet 0   sodium chloride (OCEAN) 0.65 % SOLN nasal spray Place 1 spray into both nostrils daily as needed for congestion.     spironolactone (ALDACTONE) 25 MG tablet Take 1 tablet (25 mg total) by mouth daily. (Patient taking differently: Take 25 mg by mouth daily as needed (fluid).) 90 tablet 3   allopurinol (ZYLOPRIM) 100 MG tablet Take 1 tablet (100 mg total) by mouth daily. FOR GOUT 30 tablet 6   traZODone (DESYREL) 150 MG tablet TAKE 1 TABLET(150 MG) BY MOUTH AT BEDTIME (Patient taking differently: Take 150 mg by mouth at bedtime.) 90 tablet 1   No facility-administered medications prior to visit.    Allergies  Allergen Reactions   Lisinopril Shortness Of Breath    Makes lips swell   Allopurinol Nausea Only    dizziness   Apple Juice Swelling    Causes Gout to flare up    Entresto [Sacubitril-Valsartan]     History of angioedema   Other Swelling    Nuts - Lip Swelling    Shellfish Allergy Swelling    Lip Swelling       Objective:    BP 111/69   Pulse 68   Ht 5' 4.5" (1.638 m)   Wt 156 lb (70.8 kg)   SpO2 98%   BMI 26.36 kg/m  Wt Readings from  Last 3 Encounters:  05/28/21 156 lb (70.8 kg)  05/17/21 153 lb 7 oz (69.6 kg)  05/01/21 153 lb 8 oz (69.6 kg)    Physical Exam Vitals and nursing note reviewed.  Constitutional:      Appearance: He is well-developed.  HENT:     Head: Normocephalic and atraumatic.  Cardiovascular:     Rate and Rhythm: Normal rate and regular rhythm.     Heart sounds: Normal heart sounds. No murmur heard.   No friction rub. No gallop.  Pulmonary:     Effort: Pulmonary effort is normal. No tachypnea or respiratory distress.     Breath sounds: Normal breath sounds. No decreased breath sounds, wheezing, rhonchi or rales.  Chest:     Chest wall: No tenderness.  Abdominal:     General: Bowel sounds are normal.     Palpations: Abdomen is soft.  Musculoskeletal:        General: Normal range of motion.     Cervical back: Normal range of motion.  Skin:    General: Skin is warm and dry.  Neurological:     Mental Status: He is alert and oriented to person, place, and time.     Coordination: Coordination normal.  Psychiatric:        Behavior: Behavior normal. Behavior is cooperative.        Thought Content: Thought content normal.        Judgment: Judgment normal.         Patient has been counseled extensively about nutrition and exercise as well as the importance of adherence with medications and regular follow-up. The patient was given clear instructions to go to ER or return to medical center if symptoms don't improve, worsen or new problems develop. The patient verbalized understanding.   Follow-up: Return in about 3 months (around 08/28/2021).   Gildardo Pounds, FNP-BC [facility] and [facility] [place], [place]   05/28/2021, 12:29 PM

## 2021-05-29 ENCOUNTER — Other Ambulatory Visit: Payer: Self-pay | Admitting: Genetic Counselor

## 2021-05-29 ENCOUNTER — Inpatient Hospital Stay: Payer: Medicaid Other

## 2021-05-29 ENCOUNTER — Other Ambulatory Visit: Payer: Self-pay

## 2021-05-29 ENCOUNTER — Inpatient Hospital Stay: Payer: Medicaid Other | Attending: Hematology and Oncology | Admitting: Genetic Counselor

## 2021-05-29 DIAGNOSIS — Z803 Family history of malignant neoplasm of breast: Secondary | ICD-10-CM

## 2021-05-29 DIAGNOSIS — Z800 Family history of malignant neoplasm of digestive organs: Secondary | ICD-10-CM

## 2021-05-29 DIAGNOSIS — D729 Disorder of white blood cells, unspecified: Secondary | ICD-10-CM

## 2021-05-29 DIAGNOSIS — D72829 Elevated white blood cell count, unspecified: Secondary | ICD-10-CM | POA: Insufficient documentation

## 2021-05-29 LAB — CBC WITH DIFFERENTIAL/PLATELET
Abs Immature Granulocytes: 0.2 10*3/uL — ABNORMAL HIGH (ref 0.00–0.07)
Basophils Absolute: 0.1 10*3/uL (ref 0.0–0.2)
Basophils Absolute: 0.1 10*3/uL (ref 0.0–0.1)
Basophils Relative: 0 %
Basos: 0 %
EOS (ABSOLUTE): 0.1 10*3/uL (ref 0.0–0.4)
Eos: 0 %
Eosinophils Absolute: 0 10*3/uL (ref 0.0–0.5)
Eosinophils Relative: 0 %
HCT: 31.4 % — ABNORMAL LOW (ref 39.0–52.0)
Hematocrit: 34.4 % — ABNORMAL LOW (ref 37.5–51.0)
Hemoglobin: 11.1 g/dL — ABNORMAL LOW (ref 13.0–17.7)
Hemoglobin: 10.2 g/dL — ABNORMAL LOW (ref 13.0–17.0)
Immature Grans (Abs): 0.3 10*3/uL — ABNORMAL HIGH (ref 0.0–0.1)
Immature Granulocytes: 1 %
Immature Granulocytes: 1 %
Lymphocytes Absolute: 1.9 10*3/uL (ref 0.7–3.1)
Lymphocytes Relative: 9 %
Lymphs Abs: 1.6 10*3/uL (ref 0.7–4.0)
Lymphs: 9 %
MCH: 30.9 pg (ref 26.6–33.0)
MCH: 31.8 pg (ref 26.0–34.0)
MCHC: 32.3 g/dL (ref 31.5–35.7)
MCHC: 32.5 g/dL (ref 30.0–36.0)
MCV: 96 fL (ref 79–97)
MCV: 97.8 fL (ref 80.0–100.0)
Monocytes Absolute: 0.6 10*3/uL (ref 0.1–0.9)
Monocytes Absolute: 0.9 10*3/uL (ref 0.1–1.0)
Monocytes Relative: 5 %
Monocytes: 3 %
Neutro Abs: 14.5 10*3/uL — ABNORMAL HIGH (ref 1.7–7.7)
Neutrophils Absolute: 17.8 10*3/uL — ABNORMAL HIGH (ref 1.4–7.0)
Neutrophils Relative %: 85 %
Neutrophils: 87 %
Platelets: 591 10*3/uL — ABNORMAL HIGH (ref 150–450)
Platelets: 487 10*3/uL — ABNORMAL HIGH (ref 150–400)
RBC: 3.59 x10E6/uL — ABNORMAL LOW (ref 4.14–5.80)
RBC: 3.21 MIL/uL — ABNORMAL LOW (ref 4.22–5.81)
RDW: 12.8 % (ref 11.6–15.4)
RDW: 14.6 % (ref 11.5–15.5)
WBC: 20.7 10*3/uL (ref 3.4–10.8)
WBC: 17.2 10*3/uL — ABNORMAL HIGH (ref 4.0–10.5)
nRBC: 0.2 % (ref 0.0–0.2)

## 2021-05-29 LAB — CMP14+EGFR
ALT: 34 IU/L (ref 0–44)
AST: 21 IU/L (ref 0–40)
Albumin/Globulin Ratio: 1.3 (ref 1.2–2.2)
Albumin: 4.4 g/dL (ref 3.8–4.9)
Alkaline Phosphatase: 71 IU/L (ref 44–121)
BUN/Creatinine Ratio: 18 (ref 9–20)
BUN: 24 mg/dL (ref 6–24)
Bilirubin Total: 0.4 mg/dL (ref 0.0–1.2)
CO2: 23 mmol/L (ref 20–29)
Calcium: 10.1 mg/dL (ref 8.7–10.2)
Chloride: 102 mmol/L (ref 96–106)
Creatinine, Ser: 1.33 mg/dL — ABNORMAL HIGH (ref 0.76–1.27)
Globulin, Total: 3.4 g/dL (ref 1.5–4.5)
Glucose: 114 mg/dL — ABNORMAL HIGH (ref 70–99)
Potassium: 3.8 mmol/L (ref 3.5–5.2)
Sodium: 141 mmol/L (ref 134–144)
Total Protein: 7.8 g/dL (ref 6.0–8.5)
eGFR: 62 mL/min/{1.73_m2} (ref >59)

## 2021-05-29 LAB — COMPREHENSIVE METABOLIC PANEL
ALT: 28 U/L (ref 0–44)
AST: 19 U/L (ref 15–41)
Albumin: 4.1 g/dL (ref 3.5–5.0)
Alkaline Phosphatase: 55 U/L (ref 38–126)
Anion gap: 10 (ref 5–15)
BUN: 23 mg/dL — ABNORMAL HIGH (ref 6–20)
CO2: 25 mmol/L (ref 22–32)
Calcium: 9.2 mg/dL (ref 8.9–10.3)
Chloride: 104 mmol/L (ref 98–111)
Creatinine, Ser: 1.26 mg/dL — ABNORMAL HIGH (ref 0.61–1.24)
GFR, Estimated: >60 mL/min (ref >60)
Glucose, Bld: 100 mg/dL — ABNORMAL HIGH (ref 70–99)
Potassium: 3.6 mmol/L (ref 3.5–5.1)
Sodium: 139 mmol/L (ref 135–145)
Total Bilirubin: 0.4 mg/dL (ref 0.3–1.2)
Total Protein: 7.9 g/dL (ref 6.5–8.1)

## 2021-05-29 LAB — GENETIC SCREENING ORDER

## 2021-05-29 NOTE — Progress Notes (Signed)
Make sure you are only taking BiDil and not Isordil. I did confirm this with cardiology

## 2021-05-30 ENCOUNTER — Encounter: Payer: Self-pay | Admitting: Genetic Counselor

## 2021-05-30 DIAGNOSIS — Z803 Family history of malignant neoplasm of breast: Secondary | ICD-10-CM | POA: Insufficient documentation

## 2021-05-30 DIAGNOSIS — Z800 Family history of malignant neoplasm of digestive organs: Secondary | ICD-10-CM

## 2021-05-30 HISTORY — DX: Family history of malignant neoplasm of digestive organs: Z80.0

## 2021-05-30 HISTORY — DX: Family history of malignant neoplasm of breast: Z80.3

## 2021-05-30 NOTE — Progress Notes (Signed)
REFERRING PROVIDER: Benay Pike, MD [place],  [place] [address]  PRIMARY PROVIDER:  Gildardo Pounds, NP  PRIMARY REASON FOR VISIT:  1. Family history of breast cancer   2. Family history of pancreatic cancer      HISTORY OF PRESENT ILLNESS:   Stanley [name], a 56 y.o. male, was seen for a [place] cancer genetics consultation at the request of Dr. [name] due to a family history of cancer.  Stanley [name] presents to clinic today to discuss the possibility of a hereditary predisposition to cancer, to discuss genetic testing, and to further clarify his future cancer risks, as well as potential cancer risks for family members.   Stanley [name] is a 56 y.o. male with no personal history of cancer.    CANCER HISTORY:  Oncology History   No history exists.     RISK FACTORS:  Colonoscopy: yes;  most recent in March 2023; less than 5 tubular adenomas PSA screening: none reported  Past Medical History:  Diagnosis Date   Acute renal failure (ARF) ([place])    Acute respiratory failure with hypoxia ([place]) 12/29/2018   Alcohol abuse    Anxiety    CAD (coronary artery disease)    a. dx unclear, reported PCI in 2011 but cath 2014 normal coronaries and cardiac CT 07/2016 normal coronaries, no mention of stents.   Chronic chest pain    Chronic systolic CHF (congestive heart failure) ([place])    Depression    Dyspnea    07/05/2019: per patient some times with exertion, "has to catch breath"   Family history of breast cancer 05/30/2021   Family history of pancreatic cancer 05/30/2021   Financial difficulties    Gout    Gouty arthritis    Headache    History of noncompliance with medical treatment    financial challenges   Hypertension    ICD (implantable cardioverter-defibrillator) in place    [place] [employer] ICD implant done in [place] 2015   Insomnia    Lobar pneumonia ([place]) 12/28/2018   Myocardial infarction ([place]) 2005   Nonischemic cardiomyopathy ([place])    Sleep apnea    per patient, has CPAP  does not wear it every night   Ventricular tachycardia ([place]) 01/2015   2017 - ATP delivered for sinus tach, degenerated into VT for which he received shock. Recurrent VT 03/2018.    Past Surgical History:  Procedure Laterality Date   BAROREFLEX SYSTEM INSERTION     BIOPSY  03/06/2021   Procedure: BIOPSY;  Surgeon: Yetta Flock, MD;  [facility];  Service: Gastroenterology;;   CARDIAC DEFIBRILLATOR PLACEMENT  06/05/2013   [employer] Inogen ICD implanted at [facility] in [place] for primary prevention   COLONOSCOPY WITH PROPOFOL N/A 03/06/2021   Procedure: COLONOSCOPY WITH PROPOFOL;  Surgeon: Yetta Flock, MD;  [facility];  Service: Gastroenterology;  Laterality: N/A;   ESOPHAGEAL DILATION  03/06/2021   Procedure: ESOPHAGEAL DILATION;  Surgeon: Yetta Flock, MD;  [facility];  Service: Gastroenterology;;   ESOPHAGOGASTRODUODENOSCOPY (EGD) WITH PROPOFOL N/A 03/06/2021   Procedure: ESOPHAGOGASTRODUODENOSCOPY (EGD) WITH PROPOFOL;  Surgeon: Yetta Flock, MD;  [facility];  Service: Gastroenterology;  Laterality: N/A;   ESOPHAGOGASTRODUODENOSCOPY (EGD) WITH PROPOFOL N/A 05/18/2021   Procedure: ESOPHAGOGASTRODUODENOSCOPY (EGD) WITH PROPOFOL;  Surgeon: Doran Stabler, MD;  Location: [facility];  Service: Gastroenterology;  Laterality: N/A;   HERNIA REPAIR     PERCUTANEOUS CORONARY STENT INTERVENTION (PCI-S)  01/05/2009  POLYPECTOMY  03/06/2021   Procedure: POLYPECTOMY;  Surgeon: Yetta Flock, MD;  Location: [facility];  Service: Gastroenterology;;    FAMILY HISTORY:  We obtained a detailed, 4-generation family history.  Significant diagnoses are listed below: Family History  Problem Relation Age of Onset   Breast cancer Mother    Pancreatic cancer Mother    Cancer Maternal Uncle        unknown type; dx after 35; x2 maternal uncles   Breast cancer Paternal Aunt        dx after 12   Lung  cancer Paternal Uncle      Stanley [name] is unaware of previous family history of genetic testing for hereditary cancer risks. There is no reported Ashkenazi Jewish ancestry. There is no known consanguinity.  GENETIC COUNSELING ASSESSMENT: Mr. Brackins is a 56 y.o. male with a family history of cancer which is somewhat suggestive of a hereditary cancer syndrome and predisposition to cancer given his mother's history of breast/pancreatic cancer before age 27. We, therefore, discussed and recommended the following at today's visit.   DISCUSSION: We discussed that 5 - 10% of cancer is hereditary, with most cases of hereditary breast and pancreatic cancer associated with mutations in BRCA1/2.  There are other genes that can be associated with hereditary breast or pancreatic cancer syndromes.  We discussed that testing is beneficial for several reasons, including knowing about other cancer risks, identifying potential screening and risk-reduction options that may be appropriate, and to understanding if other family members could be at risk for cancer and allowing them to undergo genetic testing.  We reviewed the characteristics, features and inheritance patterns of hereditary cancer syndromes. We also discussed genetic testing, including the appropriate family members to test, the process of testing, insurance coverage and turn-around-time for results. We discussed the implications of a negative, positive, carrier and/or variant of uncertain significant result. We discussed that negative results would be uninformative given that Stanley [name] does not have a personal history of cancer. We recommended Stanley [name] pursue genetic testing for a panel that contains genes associated with breast cancer, pancreatic, and other cancers given the limited information he has about family history of cancer.   The CancerNext-Expanded gene panel offered by [facility] and includes sequencing, rearrangement, and RNA analysis for the  following 77 genes: AIP, ALK, APC, ATM, AXIN2, BAP1, BARD1, BLM, BMPR1A, BRCA1, BRCA2, BRIP1, CDC73, CDH1, CDK4, CDKN1B, CDKN2A, CHEK2, CTNNA1, DICER1, FANCC, FH, FLCN, GALNT12, KIF1B, LZTR1, MAX, MEN1, MET, MLH1, MSH2, MSH3, MSH6, MUTYH, NBN, NF1, NF2, NTHL1, PALB2, PHOX2B, PMS2, POT1, PRKAR1A, PTCH1, PTEN, RAD51C, RAD51D, RB1, RECQL, RET, SDHA, SDHAF2, SDHB, SDHC, SDHD, SMAD4, SMARCA4, SMARCB1, SMARCE1, STK11, SUFU, TMEM127, TP53, TSC1, TSC2, VHL and XRCC2 (sequencing and deletion/duplication); EGFR, EGLN1, HOXB13, KIT, MITF, PDGFRA, POLD1, and POLE (sequencing only); EPCAM and GREM1 (deletion/duplication only).   Based on Stanley [name] family history of cancer, he meets medical criteria for genetic testing.  We discussed that some people do not want to undergo genetic testing due to fear of genetic discrimination.  A federal law called the Genetic Information Non-Discrimination Act (GINA) of 2008 helps protect individuals against genetic discrimination based on their genetic test results.  It impacts both health insurance and employment.  With health insurance, it protects against increased premiums, being kicked off insurance or being forced to take a test in order to be insured.  For employment it protects against hiring, firing and promoting decisions based on genetic test results.  GINA does not apply  to those in the [employer], those who work for companies with less than 15 employees, and new life insurance or long-term disability Engineer, structural.  Health status due to a cancer diagnosis is not protected under GINA.  PLAN: After considering the risks, benefits, and limitations, Stanley [name] provided informed consent to pursue genetic testing and the blood sample was sent to [facility] for analysis of the CancerNext-Expanded +RNAinsight Panel. Results should be available within approximately 3 weeks' time, at which point they will be disclosed by telephone to Stanley [name], as will any additional  recommendations warranted by these results. Stanley [name] will receive a summary of his genetic counseling visit and a copy of his results once available. This information will also be available in Epic.   Based on Stanley [name] family history, we recommended his siblings also have genetic counseling and testing. Stanley [name] will let us know if we can be of any assistance in coordinating genetic counseling and/or testing for this family member.   Lastly, we encouraged Stanley [name] to remain in contact with cancer genetics annually so that we can continuously update the family history and inform him of any changes in cancer genetics and testing that may be of benefit for this family.   Stanley [name] questions were answered to his satisfaction today. Our contact information was provided should additional questions or concerns arise. Thank you for the referral and allowing [place] to share in the care of your patient.   Cari M. Joette Catching, [place], [facility] Genetic Counselor [email or web address] (P) [phone]   The patient was seen for a total of 40 minutes in face-to-face genetic counseling.  The patient was seen alone.  Drs. Lindi Adie and/or Burr Medico were available to discuss this case as needed.  _______________________________________________________________________ For Office Staff:  Number of people involved in session: 1 Was an Intern/ student involved with case: no

## 2021-06-04 ENCOUNTER — Telehealth: Payer: Self-pay | Admitting: Nurse Practitioner

## 2021-06-04 NOTE — Telephone Encounter (Signed)
I text paged Stanley [name] that he should be taking BiDil and not Isordil.  PHONE [phone]

## 2021-06-05 ENCOUNTER — Other Ambulatory Visit: Payer: Self-pay | Admitting: Nurse Practitioner

## 2021-06-05 DIAGNOSIS — D72829 Elevated white blood cell count, unspecified: Secondary | ICD-10-CM

## 2021-06-13 ENCOUNTER — Other Ambulatory Visit: Payer: Self-pay | Admitting: Nurse Practitioner

## 2021-06-13 ENCOUNTER — Telehealth: Payer: Self-pay | Admitting: Pharmacist

## 2021-06-13 ENCOUNTER — Encounter: Payer: Self-pay | Admitting: Genetic Counselor

## 2021-06-13 ENCOUNTER — Telehealth: Payer: Self-pay | Admitting: Genetic Counselor

## 2021-06-13 ENCOUNTER — Ambulatory Visit: Payer: Self-pay | Admitting: Genetic Counselor

## 2021-06-13 DIAGNOSIS — M1A09X0 Idiopathic chronic gout, multiple sites, without tophus (tophi): Secondary | ICD-10-CM

## 2021-06-13 DIAGNOSIS — Z1379 Encounter for other screening for genetic and chromosomal anomalies: Secondary | ICD-10-CM

## 2021-06-13 DIAGNOSIS — Z803 Family history of malignant neoplasm of breast: Secondary | ICD-10-CM

## 2021-06-13 DIAGNOSIS — Z800 Family history of malignant neoplasm of digestive organs: Secondary | ICD-10-CM

## 2021-06-13 MED ORDER — ALLOPURINOL 300 MG PO TABS: 300.0000 mg | ORAL_TABLET | Freq: Every day | ORAL | 6 refills | Status: DC

## 2021-06-13 NOTE — Progress Notes (Signed)
HPI:   Mr. Hinderman was previously seen in the [facility] clinic due to a family history of cancer and concerns regarding a hereditary predisposition to cancer. Please refer to our prior cancer genetics clinic note for more information regarding our discussion, assessment and recommendations, at the time. Mr. Guymon recent genetic test results were disclosed to him, as were recommendations warranted by these results. These results and recommendations are discussed in more detail below.  CANCER HISTORY:  Oncology History   No history exists.    FAMILY HISTORY:  We obtained a detailed, 4-generation family history.  Significant diagnoses are listed below:      Family History  Problem Relation Age of Onset   Breast cancer Mother     Pancreatic cancer Mother     Cancer Maternal Uncle          unknown type; dx after 37; x2 maternal uncles   Breast cancer Paternal Aunt          dx after 60   Lung cancer Paternal Uncle         Mr. Tippin is unaware of previous family history of genetic testing for hereditary cancer risks. There is no reported Ashkenazi Jewish ancestry. There is no known consanguinity.  GENETIC TEST RESULTS:  The [facility] CancerNext-Expanded +RNAinsight Panel found no pathogenic mutations.  The CancerNext-Expanded gene panel offered by [facility] and includes sequencing, rearrangement, and RNA analysis for the following 77 genes: AIP, ALK, APC, ATM, AXIN2, BAP1, BARD1, BLM, BMPR1A, BRCA1, BRCA2, BRIP1, CDC73, CDH1, CDK4, CDKN1B, CDKN2A, CHEK2, CTNNA1, DICER1, FANCC, FH, FLCN, GALNT12, KIF1B, LZTR1, MAX, MEN1, MET, MLH1, MSH2, MSH3, MSH6, MUTYH, NBN, NF1, NF2, NTHL1, PALB2, PHOX2B, PMS2, POT1, PRKAR1A, PTCH1, PTEN, RAD51C, RAD51D, RB1, RECQL, RET, SDHA, SDHAF2, SDHB, SDHC, SDHD, SMAD4, SMARCA4, SMARCB1, SMARCE1, STK11, SUFU, TMEM127, TP53, TSC1, TSC2, VHL and XRCC2 (sequencing and deletion/duplication); EGFR, EGLN1, HOXB13, KIT, MITF, PDGFRA, POLD1, and POLE  (sequencing only); EPCAM and GREM1 (deletion/duplication only).   The test report has been scanned into EPIC and is located under the Molecular Pathology section of the Results Review tab.  A portion of the result report is included below for reference. Genetic testing reported out on June 11, 2021.      Even though a pathogenic variant was not identified, possible explanations for the cancer in the family may include: There may be no hereditary risk for cancer in the family. The cancers in Mr. Lemming family may be sporadic/familial or due to other genetic and environmental factors. There may be a gene mutation in one of these genes that current testing methods cannot detect but that chance is small. There could be another gene that has not yet been discovered, or that we have not yet tested, that is responsible for the cancer diagnoses in the family.  It is also possible there is a hereditary cause for the cancer in the family that Mr. Strine did not inherit.  Therefore, it is important to remain in touch with cancer genetics in the future so that we can continue to offer Mr. Womac the most up to date genetic testing.   ADDITIONAL GENETIC TESTING:  We discussed with Mr. Cass that his genetic testing was fairly extensive.  If there are additional relevant genes identified to increase cancer risk that can be analyzed in the future, we would be happy to discuss and coordinate this testing at that time.     CANCER SCREENING RECOMMENDATIONS:  Mr. Marro test result is considered  negative (normal).  This means that we have not identified a hereditary cause for his family history of cancer at this time.   An individual's cancer risk and medical management are not determined by genetic test results alone. Overall cancer risk assessment incorporates additional factors, including personal medical history, family history, and any available genetic information that may result in a personalized plan for  cancer prevention and surveillance. Therefore, it is recommended he continue to follow the cancer management and screening guidelines provided by his primary healthcare provider.  RECOMMENDATIONS FOR FAMILY MEMBERS:   Since he did not inherit a identifiable mutation in a cancer predisposition gene included on this panel, his son could not have inherited a known mutation from him in one of these genes. Individuals in this family might be at some increased risk of developing cancer, over the general population risk, due to the family history of cancer.  Individuals in the family should notify their providers of the family history of cancer. We recommend women in this family have a yearly mammogram beginning at age 73, or 66 years younger than the earliest onset of cancer, an annual clinical breast exam, and perform monthly breast self-exams.   Other members of the family may still carry a pathogenic variant in one of these genes that Mr. Hassing did not inherit. Based on the family history, we recommend his siblings have genetic counseling and testing. Mr. Prokop will let us know if we can be of any assistance in coordinating genetic counseling and/or testing for this family member.     FOLLOW-UP:  Lastly, we discussed with Mr. Redner that cancer genetics is a rapidly advancing field and it is possible that new genetic tests will be appropriate for him and/or his family members in the future. We encouraged him to remain in contact with cancer genetics on an annual basis so we can update his personal and family histories and let him know of advances in cancer genetics that may benefit this family.   Our contact number was provided. Mr. Meech questions were answered to his satisfaction, and he knows he is welcome to call us at anytime with additional questions or concerns.   Jordani Nunn M. Joette Catching, [place], [facility] Genetic Counselor [email or web address] (P) [phone]

## 2021-06-13 NOTE — Telephone Encounter (Signed)
Revealed negative genetic testing.  Discussed that we do not know why there is cancer in the family. It could be sporadic/famillial, due to a change in a gene that he did not inherit, due to a different gene that we are not testing, or maybe our current technology may not be able to pick something up.  It will be important for him to keep in contact with genetics to keep up with whether additional testing may be needed.

## 2021-06-13 NOTE — Telephone Encounter (Signed)
Per pt's insurance, allopurinol is only covered at the '100mg'$  or 300 mg dosage strengths. Will route to provider for change if appropriate.

## 2021-06-19 ENCOUNTER — Other Ambulatory Visit: Payer: Self-pay

## 2021-06-19 ENCOUNTER — Ambulatory Visit: Payer: Medicaid Other | Attending: Nurse Practitioner

## 2021-06-19 DIAGNOSIS — D72829 Elevated white blood cell count, unspecified: Secondary | ICD-10-CM

## 2021-06-20 LAB — CBC WITH DIFFERENTIAL/PLATELET
Basophils Absolute: 0.1 10*3/uL (ref 0.0–0.2)
Basos: 1 %
EOS (ABSOLUTE): 0.3 10*3/uL (ref 0.0–0.4)
Eos: 3 %
Hematocrit: 40 % (ref 37.5–51.0)
Hemoglobin: 12.9 g/dL — ABNORMAL LOW (ref 13.0–17.7)
Immature Grans (Abs): 0 10*3/uL (ref 0.0–0.1)
Immature Granulocytes: 0 %
Lymphocytes Absolute: 2.6 10*3/uL (ref 0.7–3.1)
Lymphs: 26 %
MCH: 31.3 pg (ref 26.6–33.0)
MCHC: 32.3 g/dL (ref 31.5–35.7)
MCV: 97 fL (ref 79–97)
Monocytes Absolute: 0.7 10*3/uL (ref 0.1–0.9)
Monocytes: 7 %
Neutrophils Absolute: 6.2 10*3/uL (ref 1.4–7.0)
Neutrophils: 63 %
Platelets: 288 10*3/uL (ref 150–450)
RBC: 4.12 x10E6/uL — ABNORMAL LOW (ref 4.14–5.80)
RDW: 12.8 % (ref 11.6–15.4)
WBC: 9.7 10*3/uL (ref 3.4–10.8)

## 2021-06-23 ENCOUNTER — Ambulatory Visit: Payer: Medicaid Other | Admitting: Nurse Practitioner

## 2021-06-24 ENCOUNTER — Ambulatory Visit: Payer: Medicaid Other | Attending: Nurse Practitioner | Admitting: Nurse Practitioner

## 2021-06-24 ENCOUNTER — Telehealth: Payer: Self-pay | Admitting: Nurse Practitioner

## 2021-06-24 DIAGNOSIS — F419 Anxiety disorder, unspecified: Secondary | ICD-10-CM

## 2021-06-24 NOTE — Telephone Encounter (Signed)
Sent link to connect to video. Patient did not sign on   Called and LVM twice

## 2021-06-24 NOTE — Progress Notes (Signed)
NO answer. LVM   Also sent link for video visit. Patient did not sign on.

## 2021-06-25 ENCOUNTER — Ambulatory Visit (INDEPENDENT_AMBULATORY_CARE_PROVIDER_SITE_OTHER): Payer: Medicaid Other

## 2021-06-25 DIAGNOSIS — I428 Other cardiomyopathies: Secondary | ICD-10-CM | POA: Diagnosis not present

## 2021-06-27 LAB — CUP PACEART REMOTE DEVICE CHECK
Battery Remaining Longevity: 90 mo
Battery Remaining Percentage: 81 %
Brady Statistic RV Percent Paced: 0 %
Date Time Interrogation Session: 20230621025800
HighPow Impedance: 51 Ohm
Implantable Lead Implant Date: 20150601
Implantable Lead Location: 753860
Implantable Lead Model: 295
Implantable Lead Serial Number: 134196
Implantable Pulse Generator Implant Date: 20150601
Lead Channel Impedance Value: 431 Ohm
Lead Channel Pacing Threshold Amplitude: 1.4 V
Lead Channel Pacing Threshold Pulse Width: 0.6 ms
Lead Channel Setting Pacing Amplitude: 3 V
Lead Channel Setting Pacing Pulse Width: 0.6 ms
Lead Channel Setting Sensing Sensitivity: 0.6 mV
Pulse Gen Serial Number: 190689

## 2021-06-30 ENCOUNTER — Ambulatory Visit (HOSPITAL_COMMUNITY)
Admission: RE | Admit: 2021-06-30 | Discharge: 2021-06-30 | Disposition: A | Discharge status: Home / Self Care | Payer: Medicaid Other | Source: Ambulatory Visit | Attending: Hematology and Oncology | Admitting: Hematology and Oncology

## 2021-06-30 DIAGNOSIS — R634 Abnormal weight loss: Secondary | ICD-10-CM | POA: Diagnosis present

## 2021-06-30 MED ORDER — IOHEXOL 300 MG/ML  SOLN
100.0000 mL | Freq: Once | INTRAMUSCULAR | Status: AC | PRN
  Administered 2021-06-30: 100 mL via INTRAVENOUS

## 2021-07-02 ENCOUNTER — Inpatient Hospital Stay: Payer: Medicaid Other | Attending: Hematology and Oncology | Admitting: Hematology and Oncology

## 2021-07-02 ENCOUNTER — Encounter: Payer: Self-pay | Admitting: Hematology and Oncology

## 2021-07-02 VITALS — BP 125/86 | HR 78 | Temp 98.3°F | Resp 16 | Ht 64.0 in | Wt 155.6 lb

## 2021-07-02 DIAGNOSIS — I428 Other cardiomyopathies: Secondary | ICD-10-CM | POA: Insufficient documentation

## 2021-07-02 DIAGNOSIS — D729 Disorder of white blood cells, unspecified: Secondary | ICD-10-CM

## 2021-07-02 DIAGNOSIS — R634 Abnormal weight loss: Secondary | ICD-10-CM | POA: Diagnosis not present

## 2021-07-02 DIAGNOSIS — Z800 Family history of malignant neoplasm of digestive organs: Secondary | ICD-10-CM | POA: Diagnosis not present

## 2021-07-02 DIAGNOSIS — D649 Anemia, unspecified: Secondary | ICD-10-CM | POA: Diagnosis not present

## 2021-07-02 DIAGNOSIS — D72829 Elevated white blood cell count, unspecified: Secondary | ICD-10-CM | POA: Insufficient documentation

## 2021-07-02 DIAGNOSIS — Z79899 Other long term (current) drug therapy: Secondary | ICD-10-CM | POA: Insufficient documentation

## 2021-07-02 DIAGNOSIS — Z95810 Presence of automatic (implantable) cardiac defibrillator: Secondary | ICD-10-CM | POA: Insufficient documentation

## 2021-07-02 NOTE — Progress Notes (Signed)
[facility] NOTE  Patient Care Team: Gildardo Pounds, NP as PCP - General (Nurse Practitioner) [name], Shaune Pascal, MD as PCP - Cardiology (Cardiology) Thompson Grayer, MD as PCP - Electrophysiology (Cardiology)  CHIEF COMPLAINTS/PURPOSE OF CONSULTATION:   Leukocytosis, lymphocytosis  ASSESSMENT & PLAN:   This is a very pleasant 56 year old male patient with past medical history significant for coronary artery disease, congestive heart failure, hypertension, insomnia, myocardial infarction referred to hematology for evaluation of persistent leukocytosis.    He is here for follow-up.  He continues to report some fatigue, chest pain or chest pressure especially when he is laying down.  We have also considered CT abdomen pelvis given his mom's history of pancreatic cancer and his unexplained weight loss which was negative for malignancy.  I have discussed these findings with him today.  He does have some cholelithiasis and nephrolithiasis which are nonobstructive. With regards to his ongoing fatigue, chest pressure and some chest pressure during laying down, strongly encouraged to talk to his cardiologist especially given his cardiac history.  He expressed understanding of the recommendations.  His weight appears to have stabilized overall in the past 3 months.  His CBC most recently showed resolution of leukocytosis and almost near resolution of anemia.  He also had a thorough GI work-up which did not show any evidence of primary colon malignancy.  Myeloproliferative panel did not show any evidence of primary myeloproliferative disorder.  At this time I do not believe there is an underlying hematological cause to explain his fatigue. I have recommended that he repeat CBC today to confirm resolution of leukocytosis and if this is indeed true, he should return to clinic in 1 year.  He is agreeable to these recommendations  HISTORY OF PRESENTING ILLNESS:  Stanley [name] 56  y.o. male is here because of leukocytosis.  This is a very pleasant 56 year old male patient with past medical history significant for coronary artery disease, myocardial infarction gout, hypertension referred to hematology for evaluation of persistent leukocytosis.   No erythromelalgia, intractable pruritus, personal history of thromboembolic episodes.  He is a non-smoker.  He drinks about 3 glasses of wine every weekend but previously used to drink a lot more.  Interim history  He is here for follow-up.  He continues to report fatigue, some chest pressure especially when he is laying down.  He has been eating well, appetite is good, weight has been stable..  No other B symptoms reported.  He can continues to report some diarrhea intermittently.  Rest of the pertinent 10 point ROS as mentioned above  MEDICAL HISTORY:  Past Medical History:  Diagnosis Date   Acute renal failure (ARF) ([place])    Acute respiratory failure with hypoxia ([place]) 12/29/2018   Alcohol abuse    Anxiety    CAD (coronary artery disease)    a. dx unclear, reported PCI in 2011 but cath 2014 normal coronaries and cardiac CT 07/2016 normal coronaries, no mention of stents.   Chronic chest pain    Chronic systolic CHF (congestive heart failure) ([place])    Depression    Dyspnea    07/05/2019: per patient some times with exertion, "has to catch breath"   Family history of breast cancer 05/30/2021   Family history of pancreatic cancer 05/30/2021   Financial difficulties    Gout    Gouty arthritis    Headache    History of noncompliance with medical treatment    financial challenges   Hypertension  ICD (implantable cardioverter-defibrillator) in place    [employer] ICD implant done in [place] 2015   Insomnia    Lobar pneumonia ([place]) 12/28/2018   Myocardial infarction ([place]) 2005   Nonischemic cardiomyopathy ([place])    Sleep apnea    per patient, has CPAP does not wear it every night   Ventricular tachycardia ([place]) 01/2015    2017 - ATP delivered for sinus tach, degenerated into VT for which he received shock. Recurrent VT 03/2018.    SURGICAL HISTORY: Past Surgical History:  Procedure Laterality Date   BAROREFLEX SYSTEM INSERTION     BIOPSY  03/06/2021   Procedure: BIOPSY;  Surgeon: Yetta Flock, MD;  Location: [facility];  Service: Gastroenterology;;   CARDIAC DEFIBRILLATOR PLACEMENT  06/05/2013   [employer] Inogen ICD implanted at [facility] in [place] for primary prevention   COLONOSCOPY WITH PROPOFOL N/A 03/06/2021   Procedure: COLONOSCOPY WITH PROPOFOL;  Surgeon: Yetta Flock, MD;  Location: [facility];  Service: Gastroenterology;  Laterality: N/A;   ESOPHAGEAL DILATION  03/06/2021   Procedure: ESOPHAGEAL DILATION;  Surgeon: Yetta Flock, MD;  Location: [facility];  Service: Gastroenterology;;   ESOPHAGOGASTRODUODENOSCOPY (EGD) WITH PROPOFOL N/A 03/06/2021   Procedure: ESOPHAGOGASTRODUODENOSCOPY (EGD) WITH PROPOFOL;  Surgeon: Yetta Flock, MD;  [facility];  Service: Gastroenterology;  Laterality: N/A;   ESOPHAGOGASTRODUODENOSCOPY (EGD) WITH PROPOFOL N/A 05/18/2021   Procedure: ESOPHAGOGASTRODUODENOSCOPY (EGD) WITH PROPOFOL;  Surgeon: Doran Stabler, MD;  Location: [facility];  Service: Gastroenterology;  Laterality: N/A;   HERNIA REPAIR     PERCUTANEOUS CORONARY STENT INTERVENTION (PCI-S)  01/05/2009   POLYPECTOMY  03/06/2021   Procedure: POLYPECTOMY;  Surgeon: Yetta Flock, MD;  [facility]: [facility];  Service: Gastroenterology;;    SOCIAL HISTORY: Social History   Socioeconomic History   Marital status: Divorced    Spouse name: Not on file   Number of children: 1   Years of education: 12   Highest education level: Not on file  Occupational History   Occupation: Disability  Tobacco Use   Smoking status: Never   Smokeless tobacco: Never  Vaping Use   Vaping Use: Never used  Substance and Sexual Activity   Alcohol use:  Yes    Comment: Admits to ongoing heavy drinking   Drug use: No   Sexual activity: Not Currently  Other Topics Concern   Not on file  Social History Narrative   Lives in [place] alone.  Disabled.  Previously worked as a Careers adviser.   Fun: Rest, Read and watch movies.    Social Determinants of Health   Financial Resource Strain: Medium Risk (03/21/2018)   Overall Financial Resource Strain (CARDIA)    Difficulty of Paying Living Expenses: Somewhat hard  Food Insecurity: Food Insecurity Present (03/21/2018)   Hunger Vital Sign    Worried About Running Out of Food in the Last Year: Sometimes true    Ran Out of Food in the Last Year: Sometimes true  Transportation Needs: No Transportation Needs (03/21/2018)   PRAPARE - Hydrologist (Medical): No    Lack of Transportation (Non-Medical): No  Physical Activity: Unknown (03/21/2018)   Exercise Vital Sign    Days of Exercise per Week: Patient refused    Minutes of Exercise per Session: Patient refused  Stress: Stress Concern Present (03/21/2018)   [place]    Feeling of Stress : To some extent  Social Connections:  Unknown (03/21/2018)   Social Connection and Isolation Panel [NHANES]    Frequency of Communication with Friends and Family: Patient refused    Frequency of Social Gatherings with Friends and Family: Patient refused    Attends Religious Services: Patient refused    Active Member of Clubs or Organizations: Patient refused    Attends Archivist Meetings: Patient refused    Marital Status: Patient refused  Intimate Partner Violence: Not At Risk (03/21/2018)   Humiliation, Afraid, Rape, and Kick questionnaire    Fear of Current or Ex-Partner: No    Emotionally Abused: No    Physically Abused: No    Sexually Abused: No    FAMILY HISTORY: Family History  Problem Relation Age of Onset   Breast cancer Mother     Pancreatic cancer Mother    Hypertension Father    Alzheimer's disease Father    Gout Father    Dementia Father    Hypertension Sister    Clotting disorder Sister    Obesity Brother    Bronchitis Brother    Cancer Maternal Uncle        unknown type; dx after 41; x2 maternal uncles   Breast cancer Paternal Aunt        dx after 81   Lung cancer Paternal Uncle    Heart disease Maternal Grandmother    Gout Paternal Grandfather    Colon polyps Neg Hx    Colon cancer Neg Hx    Esophageal cancer Neg Hx    Stomach cancer Neg Hx     ALLERGIES:  is allergic to lisinopril, allopurinol, apple juice, entresto [sacubitril-valsartan], other, and shellfish allergy.  MEDICATIONS:  Current Outpatient Medications  Medication Sig Dispense Refill   allopurinol (ZYLOPRIM) 300 MG tablet Take 1 tablet (300 mg total) by mouth daily. 30 tablet 6   amiodarone (PACERONE) 200 MG tablet Take 1 tablet (200 mg total) by mouth daily. 21 tablet 0   Ascorbic Acid (VITAMIN C) 1000 MG tablet Take 1,000 mg by mouth daily.     atorvastatin (LIPITOR) 20 MG tablet Take 1 tablet (20 mg total) by mouth daily. 90 tablet 3   carvedilol (COREG) 25 MG tablet TAKE 1 TABLET(25 MG) BY MOUTH TWICE DAILY WITH A MEAL 180 tablet 0   digoxin (LANOXIN) 0.125 MG tablet Take 0.5 tablets (0.0625 mg total) by mouth daily. 45 tablet 2   furosemide (LASIX) 80 MG tablet TAKE 1 TABLET BY MOUTH ON MONDAY, WEDNESDAY& FRIDAY. MAY TAKE 1 TABLET AS NEEDED FOR SWELLING (Patient taking differently: Take 80 mg by mouth every Monday, Wednesday, and Friday.) 45 tablet 2   isosorbide-hydrALAZINE (BIDIL) 20-37.5 MG tablet TAKE 1 TABLET BY MOUTH THREE TIMES DAILY (Patient taking differently: Take 1 tablet by mouth daily.) 270 tablet 1   losartan (COZAAR) 100 MG tablet Take 1 tablet (100 mg total) by mouth daily. 30 tablet 6   nitroGLYCERIN (NITROSTAT) 0.4 MG SL tablet Place 1 tablet (0.4 mg total) under the tongue every 5 (five) minutes x 3 doses as needed  for chest pain. 30 tablet 6   pantoprazole (PROTONIX) 40 MG tablet Take 1 tablet (40 mg total) by mouth 2 (two) times daily for 30 days, THEN 1 tablet (40 mg total) daily. 150 tablet 0   PARoxetine (PAXIL) 20 MG tablet Take 1 tablet (20 mg total) by mouth daily. 90 tablet 1   potassium chloride SA (KLOR-CON) 20 MEQ tablet Take 3 tablets (60 mEq total) by mouth daily. (Patient taking differently:  Take 40 mEq by mouth daily.) 180 tablet 3   sodium chloride (OCEAN) 0.65 % SOLN nasal spray Place 1 spray into both nostrils daily as needed for congestion.     spironolactone (ALDACTONE) 25 MG tablet Take 1 tablet (25 mg total) by mouth daily. (Patient taking differently: Take 25 mg by mouth daily as needed (fluid).) 90 tablet 3   traZODone (DESYREL) 150 MG tablet TAKE 1 TABLET(150 MG) BY MOUTH AT BEDTIME 90 tablet 1   No current facility-administered medications for this visit.     PHYSICAL EXAMINATION:  ECOG PERFORMANCE STATUS: 0 - Asymptomatic  Vitals:   07/02/21 1527  BP: 125/86  Pulse: 78  Resp: 16  Temp: 98.3 F (36.8 C)  SpO2: 99%    Filed Weights   07/02/21 1527  Weight: 155 lb 9.6 oz (70.6 kg)    Physical Exam Constitutional:      Appearance: Normal appearance.  Cardiovascular:     Rate and Rhythm: Normal rate and regular rhythm.     Pulses: Normal pulses.     Heart sounds: Normal heart sounds.  Pulmonary:     Effort: Pulmonary effort is normal.     Breath sounds: Normal breath sounds.  Abdominal:     General: Abdomen is flat. Bowel sounds are normal.     Palpations: Abdomen is soft.  Musculoskeletal:        General: No swelling or tenderness.     Cervical back: Normal range of motion and neck supple. No rigidity.  Lymphadenopathy:     Cervical: No cervical adenopathy.  Skin:    General: Skin is warm and dry.     Coloration: Skin is not jaundiced.  Neurological:     Mental Status: He is alert.      LABORATORY DATA:  I have reviewed the data as listed Lab  Results  Component Value Date   WBC 9.7 06/19/2021   HGB 12.9 (L) 06/19/2021   HCT 40.0 06/19/2021   MCV 97 06/19/2021   PLT 288 06/19/2021     Chemistry      Component Value Date/Time   NA 139 05/29/2021 1450   NA 141 05/28/2021 1110   K 3.6 05/29/2021 1450   CL 104 05/29/2021 1450   CO2 25 05/29/2021 1450   BUN 23 (H) 05/29/2021 1450   BUN 24 05/28/2021 1110   CREATININE 1.26 (H) 05/29/2021 1450   CREATININE 1.18 05/01/2021 1511      Component Value Date/Time   CALCIUM 9.2 05/29/2021 1450   ALKPHOS 55 05/29/2021 1450   AST 19 05/29/2021 1450   AST 28 05/01/2021 1511   ALT 28 05/29/2021 1450   ALT 21 05/01/2021 1511   BILITOT 0.4 05/29/2021 1450   BILITOT 0.4 05/28/2021 1110   BILITOT 0.7 05/01/2021 1511     Reviewed his myeloproliferative work-up with him today.  Flow is negative.  Persistent mild leukocytosis likely reactive since it is mostly neutrophilia.  Mild anemia which has essentially remained stable  I have reviewed his CBC today.  This is essentially stable compared to April 2022.  RADIOGRAPHIC STUDIES: I have personally reviewed the radiological images as listed and agreed with the findings in the report. CT Abdomen Pelvis W Contrast  Result Date: 07/01/2021 CLINICAL DATA:  Weight loss, unintended Unexplained weight loss EXAM: CT ABDOMEN AND PELVIS WITH CONTRAST TECHNIQUE: Multidetector CT imaging of the abdomen and pelvis was performed using the standard protocol following bolus administration of intravenous contrast. RADIATION DOSE REDUCTION: This exam  was performed according to the departmental dose-optimization program which includes automated exposure control, adjustment of the mA and/or kV according to patient size and/or use of iterative reconstruction technique. CONTRAST:  137m OMNIPAQUE IOHEXOL 300 MG/ML  SOLN COMPARISON:  None Available. FINDINGS: Lower chest: No acute abnormality. Partially visualized cardiac lead. Hepatobiliary: No focal liver  abnormality. Calcified gallstone noted within the gallbladder lumen. No gallbladder wall thickening or pericholecystic fluid. No biliary dilatation. Pancreas: No focal lesion. Normal pancreatic contour. No surrounding inflammatory changes. No main pancreatic ductal dilatation. Spleen: Normal in size without focal abnormality. Adrenals/Urinary Tract: No adrenal nodule bilaterally. Bilateral kidneys enhance symmetrically. Possible punctate nonobstructive right nephrolithiasis (5:62). No hydronephrosis. No hydroureter. The urinary bladder is unremarkable. Stomach/Bowel: Stomach is within normal limits. No evidence of bowel wall thickening or dilatation. Appendix appears normal. Vascular/Lymphatic: No abdominal aorta or iliac aneurysm. Mild atherosclerotic plaque of the aorta and its branches. No abdominal, pelvic, or inguinal lymphadenopathy. Reproductive: Prostate is unremarkable. Other: No intraperitoneal free fluid. No intraperitoneal free gas. No organized fluid collection. Musculoskeletal: No abdominal wall hernia or abnormality. No suspicious lytic or blastic osseous lesions. No acute displaced fracture. Intervertebral disc space vacuum phenomenon at the L5-S1 level. IMPRESSION: 1. No acute intra-abdominal or intrapelvic abnormality to suggest malignancy or metastasis. 2. Cholelithiasis with no CT finding of acute cholecystitis. 3. Nonobstructive punctate right nephrolithiasis. Electronically Signed   By: MIven FinnM.D.   On: 07/01/2021 17:19   CUP PACEART REMOTE DEVICE CHECK  Result Date: 06/27/2021 Scheduled remote reviewed. Normal device function.  Next remote 91 days. [place]    All questions were answered. The patient knows to call the clinic with any problems, questions or concerns. I spent 30 minutes in the care of this patient including H and P, review of records, counseling and coordination of care.     PBenay Pike MD 07/02/2021 3:32 PM

## 2021-07-02 NOTE — Progress Notes (Signed)
Reserve NOTE  Patient Care Team: Gildardo Pounds, NP as PCP - General (Nurse Practitioner) [name], Shaune Pascal, MD as PCP - Cardiology (Cardiology) Thompson Grayer, MD as PCP - Electrophysiology (Cardiology)  CHIEF COMPLAINTS/PURPOSE OF CONSULTATION:   Leukocytosis, lymphocytosis  ASSESSMENT & PLAN:   This is a very pleasant 56 year old male patient with past medical history significant for coronary artery disease, congestive heart failure, hypertension, insomnia, myocardial infarction referred to hematology for evaluation of persistent leukocytosis.  He had an endoscopy and colonoscopy which did not show any evidence of malignancy.  He follows up with urology for benign prostatic hyperplasia.  He denies any B symptoms or recent infections.  He does have an AICD in place for cardiac dysfunction and continues to be monitored by cardiology. During his previous visits we have planned myeloproliferative panel which was unremarkable.  We have been monitoring his labs closely.  No change in leukocytosis compared to April 2022 no anemia no thrombocytopenia.  Many years ago when he was in [place], he had a bone marrow aspiration and biopsy for the same complaint and this according to the patient was unremarkable. At this time I do not believe his current symptoms are related to his leukocytosis.  He very well could have progressive fatigue because of his cardiac history.  He should continue follow-up with his PCP and do all age-appropriate cancer screening.  I have referred him to genetics in the past but not quite sure if he was able to make to his appointment.  I will once again send another referral.  Family history significant for pancreatic cancer in mom.  Unexplained weight loss, I will order CT abdomen/pelvis given his mom with pancreatic cancer. I called him to inform this. I gave the phone number to call and schedule.  HISTORY OF PRESENTING ILLNESS:  Stanley [name] 56 y.o. male is here because of leukocytosis.  This is a very pleasant 56 year old male patient with past medical history significant for coronary artery disease, myocardial infarction gout, hypertension referred to hematology for evaluation of persistent leukocytosis.   No erythromelalgia, intractable pruritus, personal history of thromboembolic episodes.  He is a non-smoker.  He drinks about 3 glasses of wine every weekend but previously used to drink a lot more.  Interim history  He is here for follow-up.  He says he is not feeling well. He lost about 30 lbs of weight in the past 6 months.  He tells me that his body is trying to tell him something and he does not feel well.  He is worried that he could have some kind of cancer.  He had an endoscopy and colonoscopy which did not show any evidence of malignancy.  Follows up with urology for benign prostatic hyperplasia.  He also has nonischemic cardiomyopathy with an AICD in place.  No other B symptoms reported.  No change in breathing/bowel habits or urinary habits.  Rest of the pertinent 10 point ROS as mentioned above  MEDICAL HISTORY:  Past Medical History:  Diagnosis Date   Acute renal failure (ARF) ([place])    Acute respiratory failure with hypoxia ([place]) 12/29/2018   Alcohol abuse    Anxiety    CAD (coronary artery disease)    a. dx unclear, reported PCI in 2011 but cath 2014 normal coronaries and cardiac CT 07/2016 normal coronaries, no mention of stents.   Chronic chest pain    Chronic systolic CHF (congestive heart failure) ([place])    Depression  Dyspnea    07/05/2019: per patient some times with exertion, "has to catch breath"   Family history of breast cancer 05/30/2021   Family history of pancreatic cancer 05/30/2021   Financial difficulties    Gout    Gouty arthritis    Headache    History of noncompliance with medical treatment    financial challenges   Hypertension    ICD (implantable cardioverter-defibrillator) in  place    [employer] ICD implant done in [place] 2015   Insomnia    Lobar pneumonia ([place]) 12/28/2018   Myocardial infarction ([place]) 2005   Nonischemic cardiomyopathy ([place])    Sleep apnea    per patient, has CPAP does not wear it every night   Ventricular tachycardia ([place]) 01/2015   2017 - ATP delivered for sinus tach, degenerated into VT for which he received shock. Recurrent VT 03/2018.    SURGICAL HISTORY: Past Surgical History:  Procedure Laterality Date   BAROREFLEX SYSTEM INSERTION     BIOPSY  03/06/2021   Procedure: BIOPSY;  Surgeon: Yetta Flock, MD;  Location: [facility];  Service: Gastroenterology;;   CARDIAC DEFIBRILLATOR PLACEMENT  06/05/2013   [employer] Inogen ICD implanted at [facility] in [place] for primary prevention   COLONOSCOPY WITH PROPOFOL N/A 03/06/2021   Procedure: COLONOSCOPY WITH PROPOFOL;  Surgeon: Yetta Flock, MD;  Location: [facility];  Service: Gastroenterology;  Laterality: N/A;   ESOPHAGEAL DILATION  03/06/2021   Procedure: ESOPHAGEAL DILATION;  Surgeon: Yetta Flock, MD;  Location: [facility];  Service: Gastroenterology;;   ESOPHAGOGASTRODUODENOSCOPY (EGD) WITH PROPOFOL N/A 03/06/2021   Procedure: ESOPHAGOGASTRODUODENOSCOPY (EGD) WITH PROPOFOL;  Surgeon: Yetta Flock, MD;  Location: [facility];  Service: Gastroenterology;  Laterality: N/A;   ESOPHAGOGASTRODUODENOSCOPY (EGD) WITH PROPOFOL N/A 05/18/2021   Procedure: ESOPHAGOGASTRODUODENOSCOPY (EGD) WITH PROPOFOL;  Surgeon: Doran Stabler, MD;  Location: [facility];  Service: Gastroenterology;  Laterality: N/A;   HERNIA REPAIR     PERCUTANEOUS CORONARY STENT INTERVENTION (PCI-S)  01/05/2009   POLYPECTOMY  03/06/2021   Procedure: POLYPECTOMY;  Surgeon: Yetta Flock, MD;  Location: [facility];  Service: Gastroenterology;;    SOCIAL HISTORY: Social History   Socioeconomic History   Marital status: Divorced    Spouse name: Not on file   Number  of children: 1   Years of education: 12   Highest education level: Not on file  Occupational History   Occupation: Disability  Tobacco Use   Smoking status: Never   Smokeless tobacco: Never  Vaping Use   Vaping Use: Never used  Substance and Sexual Activity   Alcohol use: Yes    Comment: Admits to ongoing heavy drinking   Drug use: No   Sexual activity: Not Currently  Other Topics Concern   Not on file  Social History Narrative   Lives in [place] alone.  Disabled.  Previously worked as a Careers adviser.   Fun: Rest, Read and watch movies.    Social Determinants of Health   Financial Resource Strain: Medium Risk (03/21/2018)   Overall Financial Resource Strain (CARDIA)    Difficulty of Paying Living Expenses: Somewhat hard  Food Insecurity: Food Insecurity Present (03/21/2018)   Hunger Vital Sign    Worried About Running Out of Food in the Last Year: Sometimes true    Ran Out of Food in the Last Year: Sometimes true  Transportation Needs: No Transportation Needs (03/21/2018)   PRAPARE - Hydrologist (Medical): No  Lack of Transportation (Non-Medical): No  Physical Activity: Unknown (03/21/2018)   Exercise Vital Sign    Days of Exercise per Week: Patient refused    Minutes of Exercise per Session: Patient refused  Stress: Stress Concern Present (03/21/2018)   [place]    Feeling of Stress : To some extent  Social Connections: Unknown (03/21/2018)   Social Connection and Isolation Panel [NHANES]    Frequency of Communication with Friends and Family: Patient refused    Frequency of Social Gatherings with Friends and Family: Patient refused    Attends Religious Services: Patient refused    Active Member of Clubs or Organizations: Patient refused    Attends Archivist Meetings: Patient refused    Marital Status: Patient refused  Intimate Partner Violence: Not At  Risk (03/21/2018)   Humiliation, Afraid, Rape, and Kick questionnaire    Fear of Current or Ex-Partner: No    Emotionally Abused: No    Physically Abused: No    Sexually Abused: No    FAMILY HISTORY: Family History  Problem Relation Age of Onset   Breast cancer Mother    Pancreatic cancer Mother    Hypertension Father    Alzheimer's disease Father    Gout Father    Dementia Father    Hypertension Sister    Clotting disorder Sister    Obesity Brother    Bronchitis Brother    Cancer Maternal Uncle        unknown type; dx after 40; x2 maternal uncles   Breast cancer Paternal Aunt        dx after 70   Lung cancer Paternal Uncle    Heart disease Maternal Grandmother    Gout Paternal Grandfather    Colon polyps Neg Hx    Colon cancer Neg Hx    Esophageal cancer Neg Hx    Stomach cancer Neg Hx     ALLERGIES:  is allergic to lisinopril, allopurinol, apple juice, entresto [sacubitril-valsartan], other, and shellfish allergy.  MEDICATIONS:  Current Outpatient Medications  Medication Sig Dispense Refill   allopurinol (ZYLOPRIM) 300 MG tablet Take 1 tablet (300 mg total) by mouth daily. 30 tablet 6   amiodarone (PACERONE) 200 MG tablet Take 1 tablet (200 mg total) by mouth daily. 21 tablet 0   Ascorbic Acid (VITAMIN C) 1000 MG tablet Take 1,000 mg by mouth daily.     atorvastatin (LIPITOR) 20 MG tablet Take 1 tablet (20 mg total) by mouth daily. 90 tablet 3   carvedilol (COREG) 25 MG tablet TAKE 1 TABLET(25 MG) BY MOUTH TWICE DAILY WITH A MEAL 180 tablet 0   digoxin (LANOXIN) 0.125 MG tablet Take 0.5 tablets (0.0625 mg total) by mouth daily. 45 tablet 2   furosemide (LASIX) 80 MG tablet TAKE 1 TABLET BY MOUTH ON MONDAY, WEDNESDAY& FRIDAY. MAY TAKE 1 TABLET AS NEEDED FOR SWELLING (Patient taking differently: Take 80 mg by mouth every Monday, Wednesday, and Friday.) 45 tablet 2   isosorbide-hydrALAZINE (BIDIL) 20-37.5 MG tablet TAKE 1 TABLET BY MOUTH THREE TIMES DAILY (Patient taking  differently: Take 1 tablet by mouth daily.) 270 tablet 1   losartan (COZAAR) 100 MG tablet Take 1 tablet (100 mg total) by mouth daily. 30 tablet 6   nitroGLYCERIN (NITROSTAT) 0.4 MG SL tablet Place 1 tablet (0.4 mg total) under the tongue every 5 (five) minutes x 3 doses as needed for chest pain. 30 tablet 6   pantoprazole (PROTONIX) 40 MG tablet  Take 1 tablet (40 mg total) by mouth 2 (two) times daily for 30 days, THEN 1 tablet (40 mg total) daily. 150 tablet 0   PARoxetine (PAXIL) 20 MG tablet Take 1 tablet (20 mg total) by mouth daily. 90 tablet 1   potassium chloride SA (KLOR-CON) 20 MEQ tablet Take 3 tablets (60 mEq total) by mouth daily. (Patient taking differently: Take 40 mEq by mouth daily.) 180 tablet 3   sodium chloride (OCEAN) 0.65 % SOLN nasal spray Place 1 spray into both nostrils daily as needed for congestion.     spironolactone (ALDACTONE) 25 MG tablet Take 1 tablet (25 mg total) by mouth daily. (Patient taking differently: Take 25 mg by mouth daily as needed (fluid).) 90 tablet 3   traZODone (DESYREL) 150 MG tablet TAKE 1 TABLET(150 MG) BY MOUTH AT BEDTIME 90 tablet 1   No current facility-administered medications for this visit.     PHYSICAL EXAMINATION:  ECOG PERFORMANCE STATUS: 0 - Asymptomatic  Vitals:   07/02/21 1527  BP: 125/86  Pulse: 78  Resp: 16  Temp: 98.3 F (36.8 C)  SpO2: 99%    Filed Weights   07/02/21 1527  Weight: 155 lb 9.6 oz (70.6 kg)    GENERAL:alert, no distress and comfortable, abdominal obesity. SKIN: skin color, texture, turgor are normal, no rashes or significant lesions EYES: normal, conjunctiva are pink and non-injected, sclera clear OROPHARYNX:no exudate, no erythema and lips, buccal mucosa, and tongue normal  NECK: supple, thyroid normal size, non-tender, without nodularity LYMPH:  no palpable lymphadenopathy in the cervical, axillary or inguinal LUNGS: clear to auscultation and percussion with normal breathing effort HEART:  regular rate & rhythm and no murmurs and no lower extremity edema ABDOMEN:abdomen soft, non-tender and normal bowel sounds, midline hernia noted.  No hepatosplenomegaly. Musculoskeletal:no cyanosis of digits and no clubbing  PSYCH: alert & oriented x 3 with fluent speech NEURO: no focal motor/sensory deficits  LABORATORY DATA:  I have reviewed the data as listed Lab Results  Component Value Date   WBC 9.7 06/19/2021   HGB 12.9 (L) 06/19/2021   HCT 40.0 06/19/2021   MCV 97 06/19/2021   PLT 288 06/19/2021     Chemistry      Component Value Date/Time   NA 139 05/29/2021 1450   NA 141 05/28/2021 1110   K 3.6 05/29/2021 1450   CL 104 05/29/2021 1450   CO2 25 05/29/2021 1450   BUN 23 (H) 05/29/2021 1450   BUN 24 05/28/2021 1110   CREATININE 1.26 (H) 05/29/2021 1450   CREATININE 1.18 05/01/2021 1511      Component Value Date/Time   CALCIUM 9.2 05/29/2021 1450   ALKPHOS 55 05/29/2021 1450   AST 19 05/29/2021 1450   AST 28 05/01/2021 1511   ALT 28 05/29/2021 1450   ALT 21 05/01/2021 1511   BILITOT 0.4 05/29/2021 1450   BILITOT 0.4 05/28/2021 1110   BILITOT 0.7 05/01/2021 1511     Reviewed his myeloproliferative work-up with him today.  Flow is negative.  Persistent mild leukocytosis likely reactive since it is mostly neutrophilia.  Mild anemia which has essentially remained stable  I have reviewed his CBC today.  This is essentially stable compared to April 2022.  RADIOGRAPHIC STUDIES: I have personally reviewed the radiological images as listed and agreed with the findings in the report. CT Abdomen Pelvis W Contrast  Result Date: 07/01/2021 CLINICAL DATA:  Weight loss, unintended Unexplained weight loss EXAM: CT ABDOMEN AND PELVIS WITH CONTRAST TECHNIQUE:  Multidetector CT imaging of the abdomen and pelvis was performed using the standard protocol following bolus administration of intravenous contrast. RADIATION DOSE REDUCTION: This exam was performed according to the  departmental dose-optimization program which includes automated exposure control, adjustment of the mA and/or kV according to patient size and/or use of iterative reconstruction technique. CONTRAST:  130m OMNIPAQUE IOHEXOL 300 MG/ML  SOLN COMPARISON:  None Available. FINDINGS: Lower chest: No acute abnormality. Partially visualized cardiac lead. Hepatobiliary: No focal liver abnormality. Calcified gallstone noted within the gallbladder lumen. No gallbladder wall thickening or pericholecystic fluid. No biliary dilatation. Pancreas: No focal lesion. Normal pancreatic contour. No surrounding inflammatory changes. No main pancreatic ductal dilatation. Spleen: Normal in size without focal abnormality. Adrenals/Urinary Tract: No adrenal nodule bilaterally. Bilateral kidneys enhance symmetrically. Possible punctate nonobstructive right nephrolithiasis (5:62). No hydronephrosis. No hydroureter. The urinary bladder is unremarkable. Stomach/Bowel: Stomach is within normal limits. No evidence of bowel wall thickening or dilatation. Appendix appears normal. Vascular/Lymphatic: No abdominal aorta or iliac aneurysm. Mild atherosclerotic plaque of the aorta and its branches. No abdominal, pelvic, or inguinal lymphadenopathy. Reproductive: Prostate is unremarkable. Other: No intraperitoneal free fluid. No intraperitoneal free gas. No organized fluid collection. Musculoskeletal: No abdominal wall hernia or abnormality. No suspicious lytic or blastic osseous lesions. No acute displaced fracture. Intervertebral disc space vacuum phenomenon at the L5-S1 level. IMPRESSION: 1. No acute intra-abdominal or intrapelvic abnormality to suggest malignancy or metastasis. 2. Cholelithiasis with no CT finding of acute cholecystitis. 3. Nonobstructive punctate right nephrolithiasis. Electronically Signed   By: MIven FinnM.D.   On: 07/01/2021 17:19   CUP PACEART REMOTE DEVICE CHECK  Result Date: 06/27/2021 Scheduled remote reviewed.  Normal device function.  Next remote 91 days. [place]    All questions were answered. The patient knows to call the clinic with any problems, questions or concerns. I spent 30 minutes in the care of this patient including H and P, review of records, counseling and coordination of care.     PBenay Pike MD 07/02/2021 3:34 PM

## 2021-07-03 ENCOUNTER — Inpatient Hospital Stay: Payer: Medicaid Other

## 2021-07-09 NOTE — Progress Notes (Signed)
Remote ICD transmission.   

## 2021-07-10 ENCOUNTER — Encounter: Payer: Self-pay | Admitting: Nurse Practitioner

## 2021-07-11 ENCOUNTER — Other Ambulatory Visit: Payer: Self-pay | Admitting: Nurse Practitioner

## 2021-07-11 ENCOUNTER — Other Ambulatory Visit: Payer: Self-pay

## 2021-07-11 DIAGNOSIS — M1A09X0 Idiopathic chronic gout, multiple sites, without tophus (tophi): Secondary | ICD-10-CM

## 2021-07-11 MED ORDER — ALLOPURINOL 300 MG PO TABS
300.0000 mg | ORAL_TABLET | Freq: Every day | ORAL | 6 refills | Status: DC
  Filled 2021-07-11: qty 30, 30d supply, fill #0

## 2021-07-17 ENCOUNTER — Other Ambulatory Visit: Payer: Self-pay

## 2021-07-30 ENCOUNTER — Telehealth: Payer: Self-pay | Admitting: Student

## 2021-07-30 NOTE — Telephone Encounter (Signed)
Patient would like to speak with the nurse regarding his pacer and having an MRI.

## 2021-07-30 NOTE — Telephone Encounter (Signed)
Spoke with patient informed him his MRI is compatible, patient voiced understanding

## 2021-08-07 NOTE — Progress Notes (Signed)
Electrophysiology Office Note Date: 08/08/2021  ID:  Byrl Latin, DOB 05/25/1965, MRN [id number]  PCP: Gildardo Pounds, NP Primary Cardiologist: Glori Bickers, MD Electrophysiologist: Thompson Grayer, MD   CC: Routine ICD follow-up  Stanley [name] is a 56 y.o. male seen today for Thompson Grayer, MD for routine electrophysiology followup.    Last seen in office 10/2020   Since last being seen in our clinic the patient reports doing about the same.  He continues to have chronic, intermittent atypical chest pain. He has mild SOB with more than moderate exertion.   He is close with his sister who lives in town. He has not had more intermittent stim from his BAT device, and would like it turned up if possible.   Device History: Barostim (standard) implanted 07/07/2019 for Chronic systolic CHF [employer] single chamber ICD 06/05/2013 for chronic systolic CHF  Past Medical History:  Diagnosis Date   Acute renal failure (ARF) ([place])    Acute respiratory failure with hypoxia ([place]) 12/29/2018   Alcohol abuse    Anxiety    CAD (coronary artery disease)    a. dx unclear, reported PCI in 2011 but cath 2014 normal coronaries and cardiac CT 07/2016 normal coronaries, no mention of stents.   Chronic chest pain    Chronic systolic CHF (congestive heart failure) ([place])    Depression    Dyspnea    07/05/2019: per patient some times with exertion, "has to catch breath"   Family history of breast cancer 05/30/2021   Family history of pancreatic cancer 05/30/2021   Financial difficulties    Gout    Gouty arthritis    Headache    History of noncompliance with medical treatment    financial challenges   Hypertension    ICD (implantable cardioverter-defibrillator) in place    [place] [employer] ICD implant done in [place] 2015   Insomnia    Lobar pneumonia ([place]) 12/28/2018   Myocardial infarction ([place]) 2005   Nonischemic cardiomyopathy ([place])    Sleep apnea    per patient, has CPAP does not wear it  every night   Ventricular tachycardia ([place]) 01/2015   2017 - ATP delivered for sinus tach, degenerated into VT for which he received shock. Recurrent VT 03/2018.   Past Surgical History:  Procedure Laterality Date   BAROREFLEX SYSTEM INSERTION     BIOPSY  03/06/2021   Procedure: BIOPSY;  Surgeon: Yetta Flock, MD;  [facility];  Service: Gastroenterology;;   CARDIAC DEFIBRILLATOR PLACEMENT  06/05/2013   [employer] Inogen ICD implanted at [facility] in [place] for primary prevention   COLONOSCOPY WITH PROPOFOL N/A 03/06/2021   Procedure: COLONOSCOPY WITH PROPOFOL;  Surgeon: Yetta Flock, MD;  [facility];  Service: Gastroenterology;  Laterality: N/A;   ESOPHAGEAL DILATION  03/06/2021   Procedure: ESOPHAGEAL DILATION;  Surgeon: Yetta Flock, MD;  [facility];  Service: Gastroenterology;;   ESOPHAGOGASTRODUODENOSCOPY (EGD) WITH PROPOFOL N/A 03/06/2021   Procedure: ESOPHAGOGASTRODUODENOSCOPY (EGD) WITH PROPOFOL;  Surgeon: Yetta Flock, MD;  [facility];  Service: Gastroenterology;  Laterality: N/A;   ESOPHAGOGASTRODUODENOSCOPY (EGD) WITH PROPOFOL N/A 05/18/2021   Procedure: ESOPHAGOGASTRODUODENOSCOPY (EGD) WITH PROPOFOL;  Surgeon: Doran Stabler, MD;  Location: [facility];  Service: Gastroenterology;  Laterality: N/A;   HERNIA REPAIR     PERCUTANEOUS CORONARY STENT INTERVENTION (PCI-S)  01/05/2009   POLYPECTOMY  03/06/2021   Procedure: POLYPECTOMY;  Surgeon: Yetta Flock, MD;  [facility];  Service: Gastroenterology;;    Current Outpatient Medications  Medication Sig Dispense Refill   allopurinol (ZYLOPRIM) 300 MG tablet Take 1 tablet (300 mg total) by mouth daily. 30 tablet 6   amiodarone (PACERONE) 200 MG tablet Take 1 tablet (200 mg total) by mouth daily. 21 tablet 0   Ascorbic Acid (VITAMIN C) 1000 MG tablet Take 1,000 mg by mouth daily.     atorvastatin (LIPITOR) 20 MG tablet  Take 1 tablet (20 mg total) by mouth daily. 90 tablet 3   carvedilol (COREG) 25 MG tablet TAKE 1 TABLET(25 MG) BY MOUTH TWICE DAILY WITH A MEAL 180 tablet 0   digoxin (LANOXIN) 0.125 MG tablet Take 0.5 tablets (0.0625 mg total) by mouth daily. 45 tablet 2   furosemide (LASIX) 80 MG tablet TAKE 1 TABLET BY MOUTH ON MONDAY, WEDNESDAY& FRIDAY. MAY TAKE 1 TABLET AS NEEDED FOR SWELLING (Patient taking differently: Take 80 mg by mouth every Monday, Wednesday, and Friday.) 45 tablet 2   isosorbide-hydrALAZINE (BIDIL) 20-37.5 MG tablet TAKE 1 TABLET BY MOUTH THREE TIMES DAILY (Patient taking differently: Take 1 tablet by mouth daily.) 270 tablet 1   losartan (COZAAR) 100 MG tablet Take 1 tablet (100 mg total) by mouth daily. 30 tablet 6   nitroGLYCERIN (NITROSTAT) 0.4 MG SL tablet Place 1 tablet (0.4 mg total) under the tongue every 5 (five) minutes x 3 doses as needed for chest pain. 30 tablet 6   pantoprazole (PROTONIX) 40 MG tablet Take 1 tablet (40 mg total) by mouth 2 (two) times daily for 30 days, THEN 1 tablet (40 mg total) daily. 150 tablet 0   PARoxetine (PAXIL) 20 MG tablet Take 1 tablet (20 mg total) by mouth daily. 90 tablet 1   potassium chloride SA (KLOR-CON) 20 MEQ tablet Take 3 tablets (60 mEq total) by mouth daily. (Patient taking differently: Take 40 mEq by mouth daily.) 180 tablet 3   sodium chloride (OCEAN) 0.65 % SOLN nasal spray Place 1 spray into both nostrils daily as needed for congestion.     spironolactone (ALDACTONE) 25 MG tablet Take 1 tablet (25 mg total) by mouth daily. (Patient taking differently: Take 25 mg by mouth daily as needed (fluid).) 90 tablet 3   traZODone (DESYREL) 150 MG tablet TAKE 1 TABLET(150 MG) BY MOUTH AT BEDTIME 90 tablet 1   No current facility-administered medications for this visit.    Allergies:   Lisinopril, Allopurinol, Apple juice, Entresto [sacubitril-valsartan], Other, and Shellfish allergy   Social History: Social History   Socioeconomic  History   Marital status: Divorced    Spouse name: Not on file   Number of children: 1   Years of education: 12   Highest education level: Not on file  Occupational History   Occupation: Disability  Tobacco Use   Smoking status: Never   Smokeless tobacco: Never  Vaping Use   Vaping Use: Never used  Substance and Sexual Activity   Alcohol use: Yes    Comment: Admits to ongoing heavy drinking   Drug use: No   Sexual activity: Not Currently  Other Topics Concern   Not on file  Social History Narrative   Lives in [place] alone.  Disabled.  Previously worked as a Careers adviser.   Fun: Rest, Read and watch movies.    Social Determinants of Health   Financial Resource Strain: Medium Risk (03/21/2018)   Overall Financial Resource Strain (CARDIA)    Difficulty of Paying Living Expenses: Somewhat hard  Food Insecurity: Food Insecurity  Present (03/21/2018)   Hunger Vital Sign    Worried About Running Out of Food in the Last Year: Sometimes true    Ran Out of Food in the Last Year: Sometimes true  Transportation Needs: No Transportation Needs (03/21/2018)   PRAPARE - Hydrologist (Medical): No    Lack of Transportation (Non-Medical): No  Physical Activity: Unknown (03/21/2018)   Exercise Vital Sign    Days of Exercise per Week: Patient refused    Minutes of Exercise per Session: Patient refused  Stress: Stress Concern Present (03/21/2018)   Gypsy    Feeling of Stress : To some extent  Social Connections: Unknown (03/21/2018)   Social Connection and Isolation Panel [NHANES]    Frequency of Communication with Friends and Family: Patient refused    Frequency of Social Gatherings with Friends and Family: Patient refused    Attends Religious Services: Patient refused    Active Member of Clubs or Organizations: Patient refused    Attends Archivist Meetings: Patient refused     Marital Status: Patient refused  Intimate Partner Violence: Not At Risk (03/21/2018)   Humiliation, Afraid, Rape, and Kick questionnaire    Fear of Current or Ex-Partner: No    Emotionally Abused: No    Physically Abused: No    Sexually Abused: No    Family History: Family History  Problem Relation Age of Onset   Breast cancer Mother    Pancreatic cancer Mother    Hypertension Father    Alzheimer's disease Father    Gout Father    Dementia Father    Hypertension Sister    Clotting disorder Sister    Obesity Brother    Bronchitis Brother    Cancer Maternal Uncle        unknown type; dx after 61; x2 maternal uncles   Breast cancer Paternal Aunt        dx after 24   Lung cancer Paternal Uncle    Heart disease Maternal Grandmother    Gout Paternal Grandfather    Colon polyps Neg Hx    Colon cancer Neg Hx    Esophageal cancer Neg Hx    Stomach cancer Neg Hx    Review of systems complete and found to be negative unless listed in HPI.    Physical Exam: Vitals:   08/08/21 1223  BP: 120/76  Pulse: 76  SpO2: 98%  Weight: 158 lb (71.7 kg)  Height: '5\' 5"'$  (1.651 m)      General: Pleasant, NAD. No resp difficulty Psych: Normal affect. HEENT:  Normal, without mass or lesion.         Neck: Supple, no bruits or JVD. Carotids 2+. No lymphadenopathy/thyromegaly appreciated. Heart: PMI nondisplaced. RRR no s3, s4, or murmurs. Lungs:  Resp regular and unlabored, CTA. Abdomen: Soft, non-tender, non-distended, No HSM, BS + x 4.   Extremities: No clubbing, cyanosis or edema. DP/PT/Radials 2+ and equal bilaterally. Neuro: Alert and oriented X 3. Moves all extremities spontaneously.   ICD interrogation- reviewed in detail today,  See PACEART report  EKG:  EKG is not ordered today. EKG 05/19/2021 showed HR ~ 75 bpm, interference from BAT  Recent Labs: 01/01/2021: TSH 0.730 05/19/2021: Magnesium 2.4 05/29/2021: ALT 28; BUN 23; Creatinine, Ser 1.26; Potassium 3.6; Sodium  139 06/19/2021: Hemoglobin 12.9; Platelets 288   Wt Readings from Last 3 Encounters:  08/08/21 158 lb (71.7 kg)  07/02/21 155 lb 9.6  oz (70.6 kg)  05/28/21 156 lb (70.8 kg)     Other studies Reviewed: Additional studies/ records that were reviewed today include: Previous EP office notes.   Assessment and Plan:  1.  Chronic systolic CHF s/p Barostim (standard) and [employer] Sci single chamber ICD NYHA III chronically. Volume status stable on exam. Stable on an appropriate medical regimen Barostim titrated up to 5.8 ma. Pt had fullness in his neck and cough at 7.0 ma which resolved at 6.8.  Normal ICD function See Claudia Desanctis Art report No changes today  2. HTN Stable on current regimen. Chronic issues with compliance.   3. Hypokalemia OK 05/29/2021  4. Atypical chest pain Chronic, intermittent and stable. No exertional type pains.  Longest "episode" is 4-8 seconds.  Myoview 05/2020 with fixed chronic, persistent defect. No ischemia.   5. H/o VT Quiescent. recently Amio surveillance labs stable 10/01/20.  Current medicines are reviewed at length with the patient today.    Labs/ tests ordered today include:  No orders of the defined types were placed in this encounter.  Disposition:   Follow up with EP APP in 3 months   Signed, Shirley Friar, PA-C  08/08/2021 12:27 PM  [place] [address] [phone] (office) [phone] (fax)

## 2021-08-08 ENCOUNTER — Encounter: Payer: Self-pay | Admitting: *Deleted

## 2021-08-08 ENCOUNTER — Encounter: Payer: Self-pay | Admitting: Student

## 2021-08-08 ENCOUNTER — Ambulatory Visit (INDEPENDENT_AMBULATORY_CARE_PROVIDER_SITE_OTHER): Payer: Medicaid Other | Admitting: Student

## 2021-08-08 VITALS — BP 120/76 | HR 76 | Ht 65.0 in | Wt 158.0 lb

## 2021-08-08 DIAGNOSIS — I428 Other cardiomyopathies: Secondary | ICD-10-CM

## 2021-08-08 DIAGNOSIS — I5022 Chronic systolic (congestive) heart failure: Secondary | ICD-10-CM

## 2021-08-08 DIAGNOSIS — I472 Ventricular tachycardia, unspecified: Secondary | ICD-10-CM

## 2021-08-08 DIAGNOSIS — Z006 Encounter for examination for normal comparison and control in clinical research program: Secondary | ICD-10-CM

## 2021-08-08 LAB — CUP PACEART INCLINIC DEVICE CHECK
Date Time Interrogation Session: 20230804124349
HighPow Impedance: 52 Ohm
HighPow Impedance: 57 Ohm
Implantable Lead Implant Date: 20150601
Implantable Lead Location: 753860
Implantable Lead Model: 295
Implantable Lead Serial Number: 134196
Implantable Pulse Generator Implant Date: 20150601
Lead Channel Impedance Value: 438 Ohm
Lead Channel Pacing Threshold Amplitude: 1.5 V
Lead Channel Pacing Threshold Pulse Width: 0.6 ms
Lead Channel Sensing Intrinsic Amplitude: 8.5 mV
Lead Channel Setting Pacing Amplitude: 3 V
Lead Channel Setting Pacing Pulse Width: 0.6 ms
Lead Channel Setting Sensing Sensitivity: 0.6 mV
Pulse Gen Serial Number: 190689

## 2021-08-08 NOTE — Research (Signed)
REBALANCE Informed Consent   Subject Name: Stanley [name]  Subject met inclusion and exclusion criteria.  The informed consent form, study requirements and expectations were reviewed with the subject and questions and concerns were addressed prior to the signing of the consent form.  The subject verbalized understanding of the trial requirements.  The subject agreed to participate in the REBALANCE trial and signed the informed consent on 08-08-2021.  The informed consent was obtained prior to performance of any protocol-specific procedures for the subject.  A copy of the signed informed consent was given to the subject and a copy was placed in the subject's medical record.   Kenya Chalmers, Research Coordinator 08/08/2021   12:25 p.a.   

## 2021-08-08 NOTE — Patient Instructions (Signed)
Medication Instructions:  Your physician recommends that you continue on your current medications as directed. Please refer to the Current Medication list given to you today.  *If you need a refill on your cardiac medications before your next appointment, please call your pharmacy*   Lab Work: None  If you have labs (blood work) drawn today and your tests are completely normal, you will receive your results only by: [place] (if you have MyChart) OR A paper copy in the mail If you have any lab test that is abnormal or we need to change your treatment, we will call you to review the results.   Follow-Up: At [facility], you and your health needs are our priority.  As part of our continuing mission to provide you with exceptional heart care, we have created designated Provider Care Teams.  These Care Teams include your primary Cardiologist (physician) and Advanced Practice Providers (APPs -  Physician Assistants and Nurse Practitioners) who all work together to provide you with the care you need, when you need it.  Your next appointment:   6 month(s)  The format for your next appointment:   In Person  Provider:   Legrand Como "Oda Kilts, PA-C{

## 2021-09-02 ENCOUNTER — Other Ambulatory Visit: Payer: Self-pay | Admitting: Nurse Practitioner

## 2021-09-02 DIAGNOSIS — L811 Chloasma: Secondary | ICD-10-CM

## 2021-09-02 NOTE — Telephone Encounter (Signed)
Medication Refill - Medication: hydroquinone 4 % cream  Has the patient contacted their pharmacy? Yes.   (Agent: If no, request that the patient contact the pharmacy for the refill. If patient does not wish to contact the pharmacy document the reason why and proceed with request.) (Agent: If yes, when and what did the pharmacy advise?)  Preferred Pharmacy (with phone number or street name):  [facility] [place], [place] - [address]  [place] [address]  Phone: [phone] Fax: [phone]   Has the patient been seen for an appointment in the last year OR does the patient have an upcoming appointment? Yes.    Agent: Please be advised that RX refills may take up to 3 business days. We ask that you follow-up with your pharmacy.

## 2021-09-02 NOTE — Telephone Encounter (Signed)
Requested medication (s) are due for refill today: yes  Requested medication (s) are on the active medication list: no  Last refill:  10/01/20  Future visit scheduled: no  Notes to clinic:  rx was dc'd on 01/01/21 d/t reorder. Please advise      Requested Prescriptions  Pending Prescriptions Disp Refills   hydroquinone 4 % cream 50 g 1    Sig: Apply topically 2 (two) times daily.     Dermatology:  Other - hydroquinone Failed - 09/02/2021 11:41 AM      Failed - Manual Review: Limit use to 2 months      Passed - Valid encounter within last 12 months    Recent Outpatient Visits           3 months ago Hospital discharge follow-up   [place] Gildardo Pounds, NP   8 months ago Essential hypertension   [place], Zelda W, NP   11 months ago Essential hypertension   [place], Vernia Buff, NP   1 year ago Insomnia secondary to depression with anxiety   [place] Gildardo Pounds, NP   1 year ago Essential hypertension   [place], Vernia Buff, NP

## 2021-09-03 MED ORDER — HYDROQUINONE 4 % EX CREA: TOPICAL_CREAM | Freq: Two times a day (BID) | CUTANEOUS | 1 refills | Status: DC

## 2021-09-24 ENCOUNTER — Ambulatory Visit (INDEPENDENT_AMBULATORY_CARE_PROVIDER_SITE_OTHER): Payer: Medicaid Other

## 2021-09-24 DIAGNOSIS — I428 Other cardiomyopathies: Secondary | ICD-10-CM

## 2021-09-24 LAB — CUP PACEART REMOTE DEVICE CHECK
Battery Remaining Longevity: 90 mo
Battery Remaining Percentage: 83 %
Brady Statistic RV Percent Paced: 0 %
Date Time Interrogation Session: 20230920034900
HighPow Impedance: 52 Ohm
Implantable Lead Implant Date: 20150601
Implantable Lead Location: 753860
Implantable Lead Model: 295
Implantable Lead Serial Number: 134196
Implantable Pulse Generator Implant Date: 20150601
Lead Channel Impedance Value: 410 Ohm
Lead Channel Pacing Threshold Amplitude: 1.5 V
Lead Channel Pacing Threshold Pulse Width: 0.6 ms
Lead Channel Setting Pacing Amplitude: 3 V
Lead Channel Setting Pacing Pulse Width: 0.6 ms
Lead Channel Setting Sensing Sensitivity: 0.6 mV
Pulse Gen Serial Number: 190689

## 2021-10-07 NOTE — Progress Notes (Signed)
Remote ICD transmission.   

## 2021-10-20 ENCOUNTER — Other Ambulatory Visit: Payer: Self-pay | Admitting: Nurse Practitioner

## 2021-10-20 ENCOUNTER — Other Ambulatory Visit: Payer: Self-pay | Admitting: Student

## 2021-10-20 DIAGNOSIS — F418 Other specified anxiety disorders: Secondary | ICD-10-CM

## 2021-10-24 ENCOUNTER — Ambulatory Visit: Payer: Self-pay | Attending: Nurse Practitioner | Admitting: Nurse Practitioner

## 2021-10-24 ENCOUNTER — Other Ambulatory Visit: Payer: Self-pay | Admitting: Student

## 2021-10-24 ENCOUNTER — Encounter: Payer: Self-pay | Admitting: Nurse Practitioner

## 2021-10-24 VITALS — BP 118/77 | HR 64 | Temp 98.0°F | Ht 64.0 in | Wt 160.0 lb

## 2021-10-24 DIAGNOSIS — I10 Essential (primary) hypertension: Secondary | ICD-10-CM

## 2021-10-24 DIAGNOSIS — R7989 Other specified abnormal findings of blood chemistry: Secondary | ICD-10-CM

## 2021-10-24 DIAGNOSIS — E785 Hyperlipidemia, unspecified: Secondary | ICD-10-CM

## 2021-10-24 DIAGNOSIS — F5105 Insomnia due to other mental disorder: Secondary | ICD-10-CM

## 2021-10-24 DIAGNOSIS — L209 Atopic dermatitis, unspecified: Secondary | ICD-10-CM

## 2021-10-24 DIAGNOSIS — F418 Other specified anxiety disorders: Secondary | ICD-10-CM

## 2021-10-24 DIAGNOSIS — D72829 Elevated white blood cell count, unspecified: Secondary | ICD-10-CM

## 2021-10-24 DIAGNOSIS — F331 Major depressive disorder, recurrent, moderate: Secondary | ICD-10-CM

## 2021-10-24 MED ORDER — TRAZODONE HCL 150 MG PO TABS: 150.0000 mg | ORAL_TABLET | Freq: Every day | ORAL | 1 refills | Status: DC

## 2021-10-24 MED ORDER — TRIAMCINOLONE ACETONIDE 0.1 % EX CREA: 1.0000 | TOPICAL_CREAM | Freq: Two times a day (BID) | CUTANEOUS | 1 refills | Status: DC

## 2021-10-24 NOTE — Progress Notes (Unsigned)
Assessment & Plan:  There are no diagnoses linked to this encounter.  Patient has been counseled on age-appropriate routine health concerns for screening and prevention. These are reviewed and up-to-date. Referrals have been placed accordingly. Immunizations are up-to-date or declined.    Subjective:   Chief Complaint  Patient presents with   Rash   HPI Stanley [name] 56 y.o. male presents to office today for rash.   ROS  Past Medical History:  Diagnosis Date   Acute renal failure (ARF) ([place])    Acute respiratory failure with hypoxia ([place]) 12/29/2018   Alcohol abuse    Anxiety    CAD (coronary artery disease)    a. dx unclear, reported PCI in 2011 but cath 2014 normal coronaries and cardiac CT 07/2016 normal coronaries, no mention of stents.   Chronic chest pain    Chronic systolic CHF (congestive heart failure) ([place])    Depression    Dyspnea    07/05/2019: per patient some times with exertion, "has to catch breath"   Family history of breast cancer 05/30/2021   Family history of pancreatic cancer 05/30/2021   Financial difficulties    Gout    Gouty arthritis    Headache    History of noncompliance with medical treatment    financial challenges   Hypertension    ICD (implantable cardioverter-defibrillator) in place    [place] Sci ICD implant done in [place] 2015   Insomnia    Lobar pneumonia ([place]) 12/28/2018   Myocardial infarction ([place]) 2005   Nonischemic cardiomyopathy ([place])    Sleep apnea    per patient, has CPAP does not wear it every night   Ventricular tachycardia ([place]) 01/2015   2017 - ATP delivered for sinus tach, degenerated into VT for which he received shock. Recurrent VT 03/2018.    Past Surgical History:  Procedure Laterality Date   BAROREFLEX SYSTEM INSERTION     BIOPSY  03/06/2021   Procedure: BIOPSY;  Surgeon: Yetta Flock, MD;  [facility];  Service: Gastroenterology;;   CARDIAC DEFIBRILLATOR PLACEMENT  06/05/2013   [employer] Inogen ICD implanted at [facility] in [place] for primary prevention   COLONOSCOPY WITH PROPOFOL N/A 03/06/2021   Procedure: COLONOSCOPY WITH PROPOFOL;  Surgeon: Yetta Flock, MD;  [facility];  Service: Gastroenterology;  Laterality: N/A;   ESOPHAGEAL DILATION  03/06/2021   Procedure: ESOPHAGEAL DILATION;  Surgeon: Yetta Flock, MD;  [facility];  Service: Gastroenterology;;   ESOPHAGOGASTRODUODENOSCOPY (EGD) WITH PROPOFOL N/A 03/06/2021   Procedure: ESOPHAGOGASTRODUODENOSCOPY (EGD) WITH PROPOFOL;  Surgeon: Yetta Flock, MD;  [facility];  Service: Gastroenterology;  Laterality: N/A;   ESOPHAGOGASTRODUODENOSCOPY (EGD) WITH PROPOFOL N/A 05/18/2021   Procedure: ESOPHAGOGASTRODUODENOSCOPY (EGD) WITH PROPOFOL;  Surgeon: Doran Stabler, MD;  Location: [facility];  Service: Gastroenterology;  Laterality: N/A;   HERNIA REPAIR     PERCUTANEOUS CORONARY STENT INTERVENTION (PCI-S)  01/05/2009   POLYPECTOMY  03/06/2021   Procedure: POLYPECTOMY;  Surgeon: Yetta Flock, MD;  [facility];  Service: Gastroenterology;;    Family History  Problem Relation Age of Onset   Breast cancer Mother    Pancreatic cancer Mother    Hypertension Father    Alzheimer's disease Father    Gout Father    Dementia Father    Hypertension Sister    Clotting disorder Sister    Obesity Brother    Bronchitis Brother    Cancer Maternal Uncle  unknown type; dx after 27; x2 maternal uncles   Breast cancer Paternal Aunt        dx after 50   Lung cancer Paternal Uncle    Heart disease Maternal Grandmother    Gout Paternal Grandfather    Colon polyps Neg Hx    Colon cancer Neg Hx    Esophageal cancer Neg Hx    Stomach cancer Neg Hx     Social History Reviewed with no changes to be made today.   Outpatient Medications Prior to Visit  Medication Sig Dispense Refill   allopurinol (ZYLOPRIM) 300 MG tablet Take 1 tablet  (300 mg total) by mouth daily. 30 tablet 6   amiodarone (PACERONE) 200 MG tablet Take 1 tablet (200 mg total) by mouth daily. 21 tablet 0   Ascorbic Acid (VITAMIN C) 1000 MG tablet Take 1,000 mg by mouth daily.     atorvastatin (LIPITOR) 20 MG tablet Take 1 tablet (20 mg total) by mouth daily. 90 tablet 3   carvedilol (COREG) 25 MG tablet TAKE 1 TABLET(25 MG) BY MOUTH TWICE DAILY WITH A MEAL 180 tablet 0   digoxin (LANOXIN) 0.125 MG tablet Take 0.5 tablets (0.0625 mg total) by mouth daily. 45 tablet 2   furosemide (LASIX) 80 MG tablet TAKE 1 TABLET BY MOUTH ON MONDAY, WEDNESDAY& FRIDAY. MAY TAKE 1 TABLET AS NEEDED FOR SWELLING (Patient taking differently: Take 80 mg by mouth every Monday, Wednesday, and Friday.) 45 tablet 2   hydroquinone 4 % cream Apply topically 2 (two) times daily. 50 g 1   isosorbide-hydrALAZINE (BIDIL) 20-37.5 MG tablet TAKE 1 TABLET BY MOUTH THREE TIMES DAILY (Patient taking differently: Take 1 tablet by mouth daily.) 270 tablet 1   losartan (COZAAR) 100 MG tablet Take 1 tablet (100 mg total) by mouth daily. 30 tablet 6   nitroGLYCERIN (NITROSTAT) 0.4 MG SL tablet Place 1 tablet (0.4 mg total) under the tongue every 5 (five) minutes x 3 doses as needed for chest pain. 30 tablet 6   pantoprazole (PROTONIX) 40 MG tablet Take 1 tablet (40 mg total) by mouth 2 (two) times daily for 30 days, THEN 1 tablet (40 mg total) daily. 150 tablet 0   PARoxetine (PAXIL) 20 MG tablet Take 1 tablet (20 mg total) by mouth daily. 90 tablet 1   potassium chloride SA (KLOR-CON) 20 MEQ tablet Take 3 tablets (60 mEq total) by mouth daily. (Patient taking differently: Take 40 mEq by mouth daily.) 180 tablet 3   sodium chloride (OCEAN) 0.65 % SOLN nasal spray Place 1 spray into both nostrils daily as needed for congestion.     spironolactone (ALDACTONE) 25 MG tablet Take 1 tablet (25 mg total) by mouth daily. (Patient taking differently: Take 25 mg by mouth daily as needed (fluid).) 90 tablet 3    traZODone (DESYREL) 150 MG tablet TAKE 1 TABLET(150 MG) BY MOUTH AT BEDTIME 30 tablet 0   No facility-administered medications prior to visit.    Allergies  Allergen Reactions   Lisinopril Shortness Of Breath    Makes lips swell   Allopurinol Nausea Only    dizziness   Apple Juice Swelling    Causes Gout to flare up    Entresto [Sacubitril-Valsartan]     History of angioedema   Other Swelling    Nuts - Lip Swelling   Shellfish Allergy Swelling    Lip Swelling       Objective:    There were no vitals taken for this visit. Wt  Readings from Last 3 Encounters:  08/08/21 158 lb (71.7 kg)  07/02/21 155 lb 9.6 oz (70.6 kg)  05/28/21 156 lb (70.8 kg)    Physical Exam       Patient has been counseled extensively about nutrition and exercise as well as the importance of adherence with medications and regular follow-up. The patient was given clear instructions to go to ER or return to medical center if symptoms don't improve, worsen or new problems develop. The patient verbalized understanding.   Follow-up: No follow-ups on file.   Gildardo Pounds, FNP-BC [facility] [place], [place]   10/24/2021, 3:04 PM

## 2021-10-25 LAB — LIPID PANEL
Chol/HDL Ratio: 3.1 ratio (ref 0.0–5.0)
Cholesterol, Total: 269 mg/dL — ABNORMAL HIGH (ref 100–199)
HDL: 88 mg/dL (ref >39)
LDL Chol Calc (NIH): 143 mg/dL — ABNORMAL HIGH (ref 0–99)
Triglycerides: 214 mg/dL — ABNORMAL HIGH (ref 0–149)
VLDL Cholesterol Cal: 38 mg/dL (ref 5–40)

## 2021-10-25 LAB — CMP14+EGFR
ALT: 34 IU/L (ref 0–44)
AST: 36 IU/L (ref 0–40)
Albumin/Globulin Ratio: 1.1 — ABNORMAL LOW (ref 1.2–2.2)
Albumin: 4.2 g/dL (ref 3.8–4.9)
Alkaline Phosphatase: 76 IU/L (ref 44–121)
BUN/Creatinine Ratio: 9 (ref 9–20)
BUN: 10 mg/dL (ref 6–24)
Bilirubin Total: 0.9 mg/dL (ref 0.0–1.2)
CO2: 22 mmol/L (ref 20–29)
Calcium: 9.5 mg/dL (ref 8.7–10.2)
Chloride: 100 mmol/L (ref 96–106)
Creatinine, Ser: 1.17 mg/dL (ref 0.76–1.27)
Globulin, Total: 3.7 g/dL (ref 1.5–4.5)
Glucose: 93 mg/dL (ref 70–99)
Potassium: 3.5 mmol/L (ref 3.5–5.2)
Sodium: 139 mmol/L (ref 134–144)
Total Protein: 7.9 g/dL (ref 6.0–8.5)
eGFR: 72 mL/min/{1.73_m2} (ref >59)

## 2021-10-25 LAB — CBC WITH DIFFERENTIAL/PLATELET
Basophils Absolute: 0.1 10*3/uL (ref 0.0–0.2)
Basos: 1 %
EOS (ABSOLUTE): 0.1 10*3/uL (ref 0.0–0.4)
Eos: 1 %
Hematocrit: 42.7 % (ref 37.5–51.0)
Hemoglobin: 14.4 g/dL (ref 13.0–17.7)
Immature Grans (Abs): 0.1 10*3/uL (ref 0.0–0.1)
Immature Granulocytes: 1 %
Lymphocytes Absolute: 2 10*3/uL (ref 0.7–3.1)
Lymphs: 17 %
MCH: 33.2 pg — ABNORMAL HIGH (ref 26.6–33.0)
MCHC: 33.7 g/dL (ref 31.5–35.7)
MCV: 98 fL — ABNORMAL HIGH (ref 79–97)
Monocytes Absolute: 0.9 10*3/uL (ref 0.1–0.9)
Monocytes: 8 %
Neutrophils Absolute: 8.5 10*3/uL — ABNORMAL HIGH (ref 1.4–7.0)
Neutrophils: 72 %
Platelets: 234 10*3/uL (ref 150–450)
RBC: 4.34 x10E6/uL (ref 4.14–5.80)
RDW: 12.7 % (ref 11.6–15.4)
WBC: 11.6 10*3/uL — ABNORMAL HIGH (ref 3.4–10.8)

## 2021-10-25 LAB — THYROID PANEL WITH TSH
Free Thyroxine Index: 2.3 (ref 1.2–4.9)
T3 Uptake Ratio: 33 % (ref 24–39)
T4, Total: 6.9 ug/dL (ref 4.5–12.0)
TSH: 0.677 u[IU]/mL (ref 0.450–4.500)

## 2021-10-26 ENCOUNTER — Encounter: Payer: Self-pay | Admitting: Nurse Practitioner

## 2021-12-24 ENCOUNTER — Ambulatory Visit (INDEPENDENT_AMBULATORY_CARE_PROVIDER_SITE_OTHER): Payer: Medicaid Other

## 2021-12-24 DIAGNOSIS — Z95810 Presence of automatic (implantable) cardiac defibrillator: Secondary | ICD-10-CM

## 2021-12-24 DIAGNOSIS — I428 Other cardiomyopathies: Secondary | ICD-10-CM

## 2021-12-24 LAB — CUP PACEART REMOTE DEVICE CHECK
Battery Remaining Longevity: 84 mo
Battery Remaining Percentage: 76 %
Brady Statistic RV Percent Paced: 0 %
Date Time Interrogation Session: 20231220023100
HighPow Impedance: 57 Ohm
Implantable Lead Connection Status: 753985
Implantable Lead Implant Date: 20150601
Implantable Lead Location: 753860
Implantable Lead Model: 295
Implantable Lead Serial Number: 134196
Implantable Pulse Generator Implant Date: 20150601
Lead Channel Impedance Value: 447 Ohm
Lead Channel Pacing Threshold Amplitude: 1.5 V
Lead Channel Pacing Threshold Pulse Width: 0.6 ms
Lead Channel Setting Pacing Amplitude: 3 V
Lead Channel Setting Pacing Pulse Width: 0.6 ms
Lead Channel Setting Sensing Sensitivity: 0.6 mV
Pulse Gen Serial Number: 190689
Zone Setting Status: 755011

## 2022-01-12 ENCOUNTER — Telehealth: Payer: Self-pay | Admitting: Student

## 2022-01-12 MED ORDER — FUROSEMIDE 80 MG PO TABS: ORAL_TABLET | ORAL | 2 refills | Status: DC

## 2022-01-12 NOTE — Telephone Encounter (Signed)
*  STAT* If patient is at the pharmacy, call can be transferred to refill team.   1. Which medications need to be refilled? (please list name of each medication and dose if known) furosemide (LASIX) 80 MG tablet   2. Which pharmacy/location (including street and city if local pharmacy) is medication to be sent to? [id number] - [place], [place] - [place]    3. Do they need a 30 day or 90 day supply? 90 day supply    Patient is out of medication.

## 2022-01-12 NOTE — Telephone Encounter (Signed)
Pt's medication was sent to pt's pharmacy as requested. Confirmation received.  °

## 2022-01-19 ENCOUNTER — Other Ambulatory Visit: Payer: Self-pay | Admitting: Student

## 2022-01-19 DIAGNOSIS — I472 Ventricular tachycardia, unspecified: Secondary | ICD-10-CM

## 2022-01-19 NOTE — Progress Notes (Signed)
Remote ICD transmission.   

## 2022-02-10 ENCOUNTER — Other Ambulatory Visit: Payer: Self-pay

## 2022-02-10 ENCOUNTER — Other Ambulatory Visit: Payer: Self-pay | Admitting: Nurse Practitioner

## 2022-02-10 ENCOUNTER — Other Ambulatory Visit: Payer: Self-pay | Admitting: Student

## 2022-02-10 DIAGNOSIS — F418 Other specified anxiety disorders: Secondary | ICD-10-CM

## 2022-02-10 MED ORDER — TRAZODONE HCL 150 MG PO TABS: 150.0000 mg | ORAL_TABLET | Freq: Every day | ORAL | 1 refills | Status: DC

## 2022-02-10 MED ORDER — FUROSEMIDE 80 MG PO TABS: ORAL_TABLET | ORAL | 1 refills | Status: DC

## 2022-02-11 ENCOUNTER — Ambulatory Visit: Payer: Self-pay | Attending: Nurse Practitioner | Admitting: Nurse Practitioner

## 2022-02-11 ENCOUNTER — Encounter: Payer: Self-pay | Admitting: Nurse Practitioner

## 2022-02-11 VITALS — BP 149/80 | HR 85 | Ht 64.0 in | Wt 162.4 lb

## 2022-02-11 DIAGNOSIS — Z862 Personal history of diseases of the blood and blood-forming organs and certain disorders involving the immune mechanism: Secondary | ICD-10-CM

## 2022-02-11 DIAGNOSIS — R369 Urethral discharge, unspecified: Secondary | ICD-10-CM

## 2022-02-11 DIAGNOSIS — K219 Gastro-esophageal reflux disease without esophagitis: Secondary | ICD-10-CM

## 2022-02-11 DIAGNOSIS — Z1211 Encounter for screening for malignant neoplasm of colon: Secondary | ICD-10-CM

## 2022-02-11 DIAGNOSIS — R319 Hematuria, unspecified: Secondary | ICD-10-CM

## 2022-02-11 DIAGNOSIS — I10 Essential (primary) hypertension: Secondary | ICD-10-CM

## 2022-02-11 MED ORDER — PANTOPRAZOLE SODIUM 40 MG PO TBEC: DELAYED_RELEASE_TABLET | ORAL | 0 refills | Status: DC

## 2022-02-11 NOTE — Progress Notes (Signed)
Vomiting and blood coming from rectum and penis. Also had a nose bleed.  On going for 3 days.  1/29-1/31

## 2022-02-11 NOTE — Progress Notes (Signed)
Assessment & Plan:  Stanley [name] was seen today for hypertension.  Diagnoses and all orders for this visit:  Primary hypertension Continue all antihypertensives as prescribed.  Reminded to bring in blood pressure log for follow  up appointment.  RECOMMENDATIONS: DASH/Mediterranean Diets are healthier choices for HTN.    Colon cancer screening -     Fecal occult blood, imunochemical([facility])  Hematuria, unspecified type -     Urinalysis, Complete  GERD without esophagitis -     pantoprazole (PROTONIX) 40 MG tablet; Take 1 tablet (40 mg total) by mouth 2 (two) times daily for 30 days, THEN 1 tablet (40 mg total) daily. INSTRUCTIONS: Avoid GERD Triggers: acidic, spicy or fried foods, caffeine, coffee, sodas,  alcohol and chocolate.    History of anemia -     CBC with Differential  Bloody drainage from penis -     Urinalysis, Complete    Patient has been counseled on age-appropriate routine health concerns for screening and prevention. These are reviewed and up-to-date. Referrals have been placed accordingly. Immunizations are up-to-date or declined.    Subjective:   Chief Complaint  Patient presents with   Hypertension   HPI Stanley [name] 57 y.o. male presents to office today for follow up to HTN  He has a past medical history of Acute renal failure, Alcohol abuse, Anxiety, CAD, Chronic systolic CHF, Depression, Financial difficulties, Gout, History of noncompliance with medical treatment, HTN,  ICD, Insomnia, Lobar pneumonia (12/28/2018), MI (2005), Sleep apnea, and Ventricular tachycardia (01/2015).    He is followed by Cardiology for NICM  Currently not taking Paxil as prescribed for anxiety or depression.  He does take trazodone for insomnia and reports positive results from this  Notes an episode of vomiting bile, rectal and blood coming from his penis for 3 days with onset 02-02-2022.  States he did not seek emergency care as the symptoms lasted for 3  days only and he feels much better.  He is not on any aspirin or blood thinner   Blood pressure is elevated today.  Although he endorses medication adherence it does not appear losartan is being taken regularly. BP Readings from Last 3 Encounters:  02/11/22 (!) 149/80  10/24/21 118/77  08/08/21 120/76     Review of Systems  Constitutional:  Negative for fever, malaise/fatigue and weight loss.  HENT: Negative.  Negative for nosebleeds.   Eyes: Negative.  Negative for blurred vision, double vision and photophobia.  Respiratory: Negative.  Negative for cough and shortness of breath.   Cardiovascular: Negative.  Negative for chest pain, palpitations and leg swelling.  Gastrointestinal: Negative.  Negative for heartburn, nausea and vomiting.  Musculoskeletal: Negative.  Negative for myalgias.  Neurological: Negative.  Negative for dizziness, focal weakness, seizures and headaches.  Psychiatric/Behavioral: Negative.  Negative for suicidal ideas.     Past Medical History:  Diagnosis Date   Acute renal failure (ARF) ([place])    Acute respiratory failure with hypoxia ([place]) 12/29/2018   Alcohol abuse    Anxiety    CAD (coronary artery disease)    a. dx unclear, reported PCI in 2011 but cath 2014 normal coronaries and cardiac CT 07/2016 normal coronaries, no mention of stents.   Chronic chest pain    Chronic systolic CHF (congestive heart failure) ([place])    Depression    Dyspnea    07/05/2019: per patient some times with exertion, "has to catch breath"   Family history of breast cancer 05/30/2021   Family history of pancreatic cancer  05/30/2021   Financial difficulties    Gout    Gouty arthritis    Headache    History of noncompliance with medical treatment    financial challenges   Hypertension    ICD (implantable cardioverter-defibrillator) in place    [employer] ICD implant done in [place] 2015   Insomnia    Lobar pneumonia ([place]) 12/28/2018   Myocardial infarction ([place]) 2005   Nonischemic  cardiomyopathy ([place])    Sleep apnea    per patient, has CPAP does not wear it every night   Ventricular tachycardia ([place]) 01/2015   2017 - ATP delivered for sinus tach, degenerated into VT for which he received shock. Recurrent VT 03/2018.    Past Surgical History:  Procedure Laterality Date   BAROREFLEX SYSTEM INSERTION     BIOPSY  03/06/2021   Procedure: BIOPSY;  Surgeon: Yetta Flock, MD;  Location: [facility];  Service: Gastroenterology;;   CARDIAC DEFIBRILLATOR PLACEMENT  06/05/2013   [employer] Inogen ICD implanted at [facility] in [place] for primary prevention   COLONOSCOPY WITH PROPOFOL N/A 03/06/2021   Procedure: COLONOSCOPY WITH PROPOFOL;  Surgeon: Yetta Flock, MD;  Location: [facility];  Service: Gastroenterology;  Laterality: N/A;   ESOPHAGEAL DILATION  03/06/2021   Procedure: ESOPHAGEAL DILATION;  Surgeon: Yetta Flock, MD;  Location: [facility];  Service: Gastroenterology;;   ESOPHAGOGASTRODUODENOSCOPY (EGD) WITH PROPOFOL N/A 03/06/2021   Procedure: ESOPHAGOGASTRODUODENOSCOPY (EGD) WITH PROPOFOL;  Surgeon: Yetta Flock, MD;  Location: [facility];  Service: Gastroenterology;  Laterality: N/A;   ESOPHAGOGASTRODUODENOSCOPY (EGD) WITH PROPOFOL N/A 05/18/2021   Procedure: ESOPHAGOGASTRODUODENOSCOPY (EGD) WITH PROPOFOL;  Surgeon: Doran Stabler, MD;  Location: [facility];  Service: Gastroenterology;  Laterality: N/A;   HERNIA REPAIR     PERCUTANEOUS CORONARY STENT INTERVENTION (PCI-S)  01/05/2009   POLYPECTOMY  03/06/2021   Procedure: POLYPECTOMY;  Surgeon: Yetta Flock, MD;  Location: [facility];  Service: Gastroenterology;;    Family History  Problem Relation Age of Onset   Breast cancer Mother    Pancreatic cancer Mother    Hypertension Father    Alzheimer's disease Father    Gout Father    Dementia Father    Hypertension Sister    Clotting disorder Sister    Obesity Brother    Bronchitis Brother     Cancer Maternal Uncle        unknown type; dx after 63; x2 maternal uncles   Breast cancer Paternal Aunt        dx after 50   Lung cancer Paternal Uncle    Heart disease Maternal Grandmother    Gout Paternal Grandfather    Colon polyps Neg Hx    Colon cancer Neg Hx    Esophageal cancer Neg Hx    Stomach cancer Neg Hx     Social History Reviewed with no changes to be made today.   Outpatient Medications Prior to Visit  Medication Sig Dispense Refill   allopurinol (ZYLOPRIM) 300 MG tablet Take 1 tablet (300 mg total) by mouth daily. 30 tablet 6   amiodarone (PACERONE) 200 MG tablet Take 1 tablet (200 mg total) by mouth daily. 21 tablet 0   Ascorbic Acid (VITAMIN C) 1000 MG tablet Take 1,000 mg by mouth daily.     atorvastatin (LIPITOR) 20 MG tablet Take 1 tablet (20 mg total) by mouth daily. 90 tablet 3   carvedilol (COREG) 25 MG tablet TAKE 1 TABLET(25 MG) BY MOUTH TWICE DAILY  WITH A MEAL 180 tablet 0   digoxin (LANOXIN) 0.125 MG tablet TAKE 1/2 TABLET(0.063 MG) BY MOUTH DAILY 45 tablet 2   furosemide (LASIX) 80 MG tablet TAKE 1 TABLET BY MOUTH ON MONDAY, WEDNESDAY& FRIDAY. MAY TAKE 1 TABLET AS NEEDED FOR SWELLING 45 tablet 1   hydroquinone 4 % cream Apply topically 2 (two) times daily. 50 g 1   isosorbide-hydrALAZINE (BIDIL) 20-37.5 MG tablet TAKE 1 TABLET BY MOUTH THREE TIMES DAILY 270 tablet 1   losartan (COZAAR) 100 MG tablet Take 1 tablet (100 mg total) by mouth daily. 30 tablet 6   nitroGLYCERIN (NITROSTAT) 0.4 MG SL tablet Place 1 tablet (0.4 mg total) under the tongue every 5 (five) minutes x 3 doses as needed for chest pain. 30 tablet 6   PARoxetine (PAXIL) 20 MG tablet Take 1 tablet (20 mg total) by mouth daily. 90 tablet 1   potassium chloride SA (KLOR-CON) 20 MEQ tablet Take 3 tablets (60 mEq total) by mouth daily. 180 tablet 3   sodium chloride (OCEAN) 0.65 % SOLN nasal spray Place 1 spray into both nostrils daily as needed for congestion.     spironolactone (ALDACTONE) 25  MG tablet Take 1 tablet (25 mg total) by mouth daily. 90 tablet 3   traZODone (DESYREL) 150 MG tablet Take 1 tablet (150 mg total) by mouth at bedtime. 90 tablet 1   triamcinolone cream (KENALOG) 0.1 % Apply 1 Application topically 2 (two) times daily. 60 g 1   pantoprazole (PROTONIX) 40 MG tablet Take 1 tablet (40 mg total) by mouth 2 (two) times daily for 30 days, THEN 1 tablet (40 mg total) daily. (Patient not taking: Reported on 02/11/2022) 150 tablet 0   No facility-administered medications prior to visit.    Allergies  Allergen Reactions   Lisinopril Shortness Of Breath    Makes lips swell   Allopurinol Nausea Only    dizziness   Apple Juice Swelling    Causes Gout to flare up    Entresto [Sacubitril-Valsartan]     History of angioedema   Other Swelling    Nuts - Lip Swelling   Shellfish Allergy Swelling    Lip Swelling       Objective:    BP (!) 149/80   Pulse 85   Ht '5\' 4"'$  (1.626 m)   Wt 162 lb 6.4 oz (73.7 kg)   SpO2 98%   BMI 27.88 kg/m  Wt Readings from Last 3 Encounters:  02/11/22 162 lb 6.4 oz (73.7 kg)  10/24/21 160 lb (72.6 kg)  08/08/21 158 lb (71.7 kg)    Physical Exam Vitals and nursing note reviewed.  Constitutional:      Appearance: He is well-developed.  HENT:     Head: Normocephalic and atraumatic.  Cardiovascular:     Rate and Rhythm: Normal rate and regular rhythm.     Heart sounds: Normal heart sounds. No murmur heard.    No friction rub. No gallop.  Pulmonary:     Effort: Pulmonary effort is normal. No tachypnea or respiratory distress.     Breath sounds: Normal breath sounds. No decreased breath sounds, wheezing, rhonchi or rales.  Chest:     Chest wall: No tenderness.  Abdominal:     General: Bowel sounds are normal.     Palpations: Abdomen is soft.  Musculoskeletal:        General: Normal range of motion.     Cervical back: Normal range of motion.  Skin:  General: Skin is warm and dry.  Neurological:     Mental Status: He is  alert and oriented to person, place, and time.     Coordination: Coordination normal.  Psychiatric:        Behavior: Behavior normal. Behavior is cooperative.        Thought Content: Thought content normal.        Judgment: Judgment normal.          Patient has been counseled extensively about nutrition and exercise as well as the importance of adherence with medications and regular follow-up. The patient was given clear instructions to go to ER or return to medical center if symptoms don't improve, worsen or new problems develop. The patient verbalized understanding.   Follow-up: Return in about 3 months (around 05/12/2022).   Gildardo Pounds, FNP-BC [facility] [place], [place]   02/11/2022, 5:46 PM

## 2022-02-12 LAB — URINALYSIS, COMPLETE
Bilirubin, UA: NEGATIVE
Glucose, UA: NEGATIVE
Ketones, UA: NEGATIVE
Leukocytes,UA: NEGATIVE
Nitrite, UA: NEGATIVE
RBC, UA: NEGATIVE
Specific Gravity, UA: 1.023 (ref 1.005–1.030)
Urobilinogen, Ur: 1 mg/dL (ref 0.2–1.0)
pH, UA: 7 (ref 5.0–7.5)

## 2022-02-12 LAB — CBC WITH DIFFERENTIAL/PLATELET
Basophils Absolute: 0.1 10*3/uL (ref 0.0–0.2)
Basos: 1 %
EOS (ABSOLUTE): 0.1 10*3/uL (ref 0.0–0.4)
Eos: 1 %
Hematocrit: 38.8 % (ref 37.5–51.0)
Hemoglobin: 13.1 g/dL (ref 13.0–17.7)
Immature Grans (Abs): 0 10*3/uL (ref 0.0–0.1)
Immature Granulocytes: 0 %
Lymphocytes Absolute: 1.8 10*3/uL (ref 0.7–3.1)
Lymphs: 17 %
MCH: 33.3 pg — ABNORMAL HIGH (ref 26.6–33.0)
MCHC: 33.8 g/dL (ref 31.5–35.7)
MCV: 99 fL — ABNORMAL HIGH (ref 79–97)
Monocytes Absolute: 0.8 10*3/uL (ref 0.1–0.9)
Monocytes: 8 %
Neutrophils Absolute: 7.7 10*3/uL — ABNORMAL HIGH (ref 1.4–7.0)
Neutrophils: 73 %
Platelets: 270 10*3/uL (ref 150–450)
RBC: 3.93 x10E6/uL — ABNORMAL LOW (ref 4.14–5.80)
RDW: 11.8 % (ref 11.6–15.4)
WBC: 10.6 10*3/uL (ref 3.4–10.8)

## 2022-02-12 LAB — MICROSCOPIC EXAMINATION
Bacteria, UA: NONE SEEN
Casts: NONE SEEN /lpf
Epithelial Cells (non renal): NONE SEEN /hpf (ref 0–10)
RBC, Urine: NONE SEEN /hpf (ref 0–2)

## 2022-02-17 ENCOUNTER — Other Ambulatory Visit: Payer: Self-pay

## 2022-02-17 MED ORDER — FUROSEMIDE 80 MG PO TABS: ORAL_TABLET | ORAL | 2 refills | Status: DC

## 2022-02-26 ENCOUNTER — Other Ambulatory Visit: Payer: Self-pay | Admitting: Student

## 2022-02-26 DIAGNOSIS — I472 Ventricular tachycardia, unspecified: Secondary | ICD-10-CM

## 2022-03-25 ENCOUNTER — Ambulatory Visit (INDEPENDENT_AMBULATORY_CARE_PROVIDER_SITE_OTHER): Payer: Medicaid Other

## 2022-03-25 DIAGNOSIS — I428 Other cardiomyopathies: Secondary | ICD-10-CM

## 2022-03-26 LAB — CUP PACEART REMOTE DEVICE CHECK
Battery Remaining Longevity: 78 mo
Battery Remaining Percentage: 72 %
Brady Statistic RV Percent Paced: 0 %
Date Time Interrogation Session: 20240320023000
HighPow Impedance: 44 Ohm
Implantable Lead Connection Status: 753985
Implantable Lead Implant Date: 20150601
Implantable Lead Location: 753860
Implantable Lead Model: 295
Implantable Lead Serial Number: 134196
Implantable Pulse Generator Implant Date: 20150601
Lead Channel Impedance Value: 383 Ohm
Lead Channel Pacing Threshold Amplitude: 1.5 V
Lead Channel Pacing Threshold Pulse Width: 0.6 ms
Lead Channel Setting Pacing Amplitude: 3 V
Lead Channel Setting Pacing Pulse Width: 0.6 ms
Lead Channel Setting Sensing Sensitivity: 0.6 mV
Pulse Gen Serial Number: 190689
Zone Setting Status: 755011

## 2022-04-30 NOTE — Progress Notes (Signed)
Remote ICD transmission.   

## 2022-06-08 ENCOUNTER — Encounter (HOSPITAL_COMMUNITY): Payer: Self-pay

## 2022-06-08 ENCOUNTER — Inpatient Hospital Stay (HOSPITAL_COMMUNITY)
Admission: EM | Admit: 2022-06-08 | Discharge: 2022-06-16 | DRG: 286 | Disposition: A | Discharge status: Home Health | Payer: Medicare Other | Attending: Internal Medicine | Admitting: Internal Medicine

## 2022-06-08 ENCOUNTER — Other Ambulatory Visit: Payer: Self-pay

## 2022-06-08 ENCOUNTER — Emergency Department (HOSPITAL_COMMUNITY): Payer: Medicare Other

## 2022-06-08 ENCOUNTER — Observation Stay (HOSPITAL_COMMUNITY): Payer: Medicare Other

## 2022-06-08 DIAGNOSIS — I5043 Acute on chronic combined systolic (congestive) and diastolic (congestive) heart failure: Secondary | ICD-10-CM

## 2022-06-08 DIAGNOSIS — Z4502 Encounter for adjustment and management of automatic implantable cardiac defibrillator: Principal | ICD-10-CM

## 2022-06-08 DIAGNOSIS — Z955 Presence of coronary angioplasty implant and graft: Secondary | ICD-10-CM

## 2022-06-08 DIAGNOSIS — F10239 Alcohol dependence with withdrawal, unspecified: Secondary | ICD-10-CM | POA: Diagnosis not present

## 2022-06-08 DIAGNOSIS — R001 Bradycardia, unspecified: Secondary | ICD-10-CM | POA: Diagnosis not present

## 2022-06-08 DIAGNOSIS — Z8711 Personal history of peptic ulcer disease: Secondary | ICD-10-CM

## 2022-06-08 DIAGNOSIS — M109 Gout, unspecified: Secondary | ICD-10-CM | POA: Diagnosis present

## 2022-06-08 DIAGNOSIS — E1122 Type 2 diabetes mellitus with diabetic chronic kidney disease: Secondary | ICD-10-CM | POA: Diagnosis present

## 2022-06-08 DIAGNOSIS — K8020 Calculus of gallbladder without cholecystitis without obstruction: Secondary | ICD-10-CM | POA: Diagnosis present

## 2022-06-08 DIAGNOSIS — F10920 Alcohol use, unspecified with intoxication, uncomplicated: Secondary | ICD-10-CM

## 2022-06-08 DIAGNOSIS — I2510 Atherosclerotic heart disease of native coronary artery without angina pectoris: Secondary | ICD-10-CM | POA: Diagnosis present

## 2022-06-08 DIAGNOSIS — E538 Deficiency of other specified B group vitamins: Secondary | ICD-10-CM | POA: Diagnosis present

## 2022-06-08 DIAGNOSIS — R0789 Other chest pain: Secondary | ICD-10-CM

## 2022-06-08 DIAGNOSIS — D539 Nutritional anemia, unspecified: Secondary | ICD-10-CM | POA: Diagnosis present

## 2022-06-08 DIAGNOSIS — Y905 Blood alcohol level of 100-119 mg/100 ml: Secondary | ICD-10-CM | POA: Diagnosis present

## 2022-06-08 DIAGNOSIS — I472 Ventricular tachycardia, unspecified: Principal | ICD-10-CM

## 2022-06-08 DIAGNOSIS — Z91018 Allergy to other foods: Secondary | ICD-10-CM

## 2022-06-08 DIAGNOSIS — Z7141 Alcohol abuse counseling and surveillance of alcoholic: Secondary | ICD-10-CM

## 2022-06-08 DIAGNOSIS — F10220 Alcohol dependence with intoxication, uncomplicated: Secondary | ICD-10-CM | POA: Diagnosis present

## 2022-06-08 DIAGNOSIS — Z91148 Patient's other noncompliance with medication regimen for other reason: Secondary | ICD-10-CM

## 2022-06-08 DIAGNOSIS — Z888 Allergy status to other drugs, medicaments and biological substances status: Secondary | ICD-10-CM

## 2022-06-08 DIAGNOSIS — R109 Unspecified abdominal pain: Secondary | ICD-10-CM

## 2022-06-08 DIAGNOSIS — N179 Acute kidney failure, unspecified: Secondary | ICD-10-CM | POA: Diagnosis present

## 2022-06-08 DIAGNOSIS — Z79899 Other long term (current) drug therapy: Secondary | ICD-10-CM

## 2022-06-08 DIAGNOSIS — G8929 Other chronic pain: Secondary | ICD-10-CM | POA: Diagnosis present

## 2022-06-08 DIAGNOSIS — R7989 Other specified abnormal findings of blood chemistry: Secondary | ICD-10-CM | POA: Diagnosis not present

## 2022-06-08 DIAGNOSIS — K449 Diaphragmatic hernia without obstruction or gangrene: Secondary | ICD-10-CM | POA: Diagnosis present

## 2022-06-08 DIAGNOSIS — R570 Cardiogenic shock: Secondary | ICD-10-CM | POA: Diagnosis not present

## 2022-06-08 DIAGNOSIS — I700 Atherosclerosis of aorta: Secondary | ICD-10-CM

## 2022-06-08 DIAGNOSIS — Z5986 Financial insecurity: Secondary | ICD-10-CM

## 2022-06-08 DIAGNOSIS — J690 Pneumonitis due to inhalation of food and vomit: Secondary | ICD-10-CM | POA: Diagnosis not present

## 2022-06-08 DIAGNOSIS — F1010 Alcohol abuse, uncomplicated: Secondary | ICD-10-CM | POA: Diagnosis present

## 2022-06-08 DIAGNOSIS — E876 Hypokalemia: Secondary | ICD-10-CM | POA: Diagnosis not present

## 2022-06-08 DIAGNOSIS — I130 Hypertensive heart and chronic kidney disease with heart failure and stage 1 through stage 4 chronic kidney disease, or unspecified chronic kidney disease: Secondary | ICD-10-CM | POA: Diagnosis present

## 2022-06-08 DIAGNOSIS — K222 Esophageal obstruction: Secondary | ICD-10-CM | POA: Diagnosis present

## 2022-06-08 DIAGNOSIS — E873 Alkalosis: Secondary | ICD-10-CM | POA: Diagnosis not present

## 2022-06-08 DIAGNOSIS — E871 Hypo-osmolality and hyponatremia: Secondary | ICD-10-CM | POA: Diagnosis not present

## 2022-06-08 DIAGNOSIS — F419 Anxiety disorder, unspecified: Secondary | ICD-10-CM | POA: Diagnosis present

## 2022-06-08 DIAGNOSIS — F32A Depression, unspecified: Secondary | ICD-10-CM | POA: Insufficient documentation

## 2022-06-08 DIAGNOSIS — Z781 Physical restraint status: Secondary | ICD-10-CM

## 2022-06-08 DIAGNOSIS — R1319 Other dysphagia: Secondary | ICD-10-CM | POA: Diagnosis present

## 2022-06-08 DIAGNOSIS — I252 Old myocardial infarction: Secondary | ICD-10-CM

## 2022-06-08 DIAGNOSIS — J9601 Acute respiratory failure with hypoxia: Secondary | ICD-10-CM | POA: Diagnosis not present

## 2022-06-08 DIAGNOSIS — I5022 Chronic systolic (congestive) heart failure: Secondary | ICD-10-CM | POA: Diagnosis present

## 2022-06-08 DIAGNOSIS — Z91013 Allergy to seafood: Secondary | ICD-10-CM

## 2022-06-08 DIAGNOSIS — Z800 Family history of malignant neoplasm of digestive organs: Secondary | ICD-10-CM

## 2022-06-08 DIAGNOSIS — R7401 Elevation of levels of liver transaminase levels: Secondary | ICD-10-CM | POA: Diagnosis not present

## 2022-06-08 DIAGNOSIS — N182 Chronic kidney disease, stage 2 (mild): Secondary | ICD-10-CM | POA: Diagnosis present

## 2022-06-08 DIAGNOSIS — E785 Hyperlipidemia, unspecified: Secondary | ICD-10-CM | POA: Diagnosis present

## 2022-06-08 DIAGNOSIS — R579 Shock, unspecified: Secondary | ICD-10-CM

## 2022-06-08 DIAGNOSIS — I5023 Acute on chronic systolic (congestive) heart failure: Secondary | ICD-10-CM | POA: Diagnosis not present

## 2022-06-08 DIAGNOSIS — I428 Other cardiomyopathies: Secondary | ICD-10-CM | POA: Diagnosis present

## 2022-06-08 DIAGNOSIS — Z8249 Family history of ischemic heart disease and other diseases of the circulatory system: Secondary | ICD-10-CM

## 2022-06-08 DIAGNOSIS — Z95810 Presence of automatic (implantable) cardiac defibrillator: Secondary | ICD-10-CM

## 2022-06-08 DIAGNOSIS — G4733 Obstructive sleep apnea (adult) (pediatric): Secondary | ICD-10-CM | POA: Diagnosis present

## 2022-06-08 DIAGNOSIS — I2721 Secondary pulmonary arterial hypertension: Secondary | ICD-10-CM | POA: Diagnosis present

## 2022-06-08 LAB — I-STAT CHEM 8, ED
BUN: 14 mg/dL (ref 6–20)
Calcium, Ion: 1.02 mmol/L — ABNORMAL LOW (ref 1.15–1.40)
Chloride: 111 mmol/L (ref 98–111)
Creatinine, Ser: 1 mg/dL (ref 0.61–1.24)
Glucose, Bld: 50 mg/dL — ABNORMAL LOW (ref 70–99)
HCT: 35 % — ABNORMAL LOW (ref 39.0–52.0)
Hemoglobin: 11.9 g/dL — ABNORMAL LOW (ref 13.0–17.0)
Potassium: 3.1 mmol/L — ABNORMAL LOW (ref 3.5–5.1)
Sodium: 142 mmol/L (ref 135–145)
TCO2: 15 mmol/L — ABNORMAL LOW (ref 22–32)

## 2022-06-08 LAB — CBG MONITORING, ED
Glucose-Capillary: 52 mg/dL — ABNORMAL LOW (ref 70–99)
Glucose-Capillary: 84 mg/dL (ref 70–99)

## 2022-06-08 LAB — CBC WITH DIFFERENTIAL/PLATELET
Abs Immature Granulocytes: 0.02 10*3/uL (ref 0.00–0.07)
Basophils Absolute: 0 10*3/uL (ref 0.0–0.1)
Basophils Relative: 1 %
Eosinophils Absolute: 0 10*3/uL (ref 0.0–0.5)
Eosinophils Relative: 1 %
HCT: 35.2 % — ABNORMAL LOW (ref 39.0–52.0)
Hemoglobin: 11.9 g/dL — ABNORMAL LOW (ref 13.0–17.0)
Immature Granulocytes: 1 %
Lymphocytes Relative: 17 %
Lymphs Abs: 0.8 10*3/uL (ref 0.7–4.0)
MCH: 35.6 pg — ABNORMAL HIGH (ref 26.0–34.0)
MCHC: 33.8 g/dL (ref 30.0–36.0)
MCV: 105.4 fL — ABNORMAL HIGH (ref 80.0–100.0)
Monocytes Absolute: 0.3 10*3/uL (ref 0.1–1.0)
Monocytes Relative: 8 %
Neutro Abs: 3.2 10*3/uL (ref 1.7–7.7)
Neutrophils Relative %: 72 %
Platelets: 156 10*3/uL (ref 150–400)
RBC: 3.34 MIL/uL — ABNORMAL LOW (ref 4.22–5.81)
RDW: 12.4 % (ref 11.5–15.5)
WBC: 4.4 10*3/uL (ref 4.0–10.5)
nRBC: 0 % (ref 0.0–0.2)

## 2022-06-08 LAB — BASIC METABOLIC PANEL
Anion gap: 19 — ABNORMAL HIGH (ref 5–15)
BUN: 15 mg/dL (ref 6–20)
CO2: 15 mmol/L — ABNORMAL LOW (ref 22–32)
Calcium: 8.9 mg/dL (ref 8.9–10.3)
Chloride: 106 mmol/L (ref 98–111)
Creatinine, Ser: 1.12 mg/dL (ref 0.61–1.24)
GFR, Estimated: >60 mL/min (ref >60)
Glucose, Bld: 54 mg/dL — ABNORMAL LOW (ref 70–99)
Potassium: 3.1 mmol/L — ABNORMAL LOW (ref 3.5–5.1)
Sodium: 140 mmol/L (ref 135–145)

## 2022-06-08 LAB — LIPASE, BLOOD: Lipase: 60 U/L — ABNORMAL HIGH (ref 11–51)

## 2022-06-08 LAB — HEPATIC FUNCTION PANEL
ALT: 174 U/L — ABNORMAL HIGH (ref 0–44)
AST: 312 U/L — ABNORMAL HIGH (ref 15–41)
Albumin: 3.3 g/dL — ABNORMAL LOW (ref 3.5–5.0)
Alkaline Phosphatase: 72 U/L (ref 38–126)
Bilirubin, Direct: 0.6 mg/dL — ABNORMAL HIGH (ref 0.0–0.2)
Indirect Bilirubin: 1 mg/dL — ABNORMAL HIGH (ref 0.3–0.9)
Total Bilirubin: 1.6 mg/dL — ABNORMAL HIGH (ref 0.3–1.2)
Total Protein: 6.1 g/dL — ABNORMAL LOW (ref 6.5–8.1)

## 2022-06-08 LAB — TROPONIN I (HIGH SENSITIVITY)
Troponin I (High Sensitivity): 41 ng/L — ABNORMAL HIGH (ref <18)
Troponin I (High Sensitivity): 229 ng/L (ref <18)
Troponin I (High Sensitivity): 304 ng/L (ref <18)
Troponin I (High Sensitivity): 223 ng/L (ref <18)

## 2022-06-08 LAB — DIGOXIN LEVEL: Digoxin Level: <0.2 ng/mL — ABNORMAL LOW (ref 0.8–2.0)

## 2022-06-08 LAB — ETHANOL: Alcohol, Ethyl (B): 104 mg/dL — ABNORMAL HIGH (ref <10)

## 2022-06-08 LAB — MAGNESIUM: Magnesium: 1.2 mg/dL — ABNORMAL LOW (ref 1.7–2.4)

## 2022-06-08 MED ORDER — LORAZEPAM 1 MG PO TABS
1.0000 mg | ORAL_TABLET | ORAL | Status: AC | PRN
  Administered 2022-06-08: 2 mg via ORAL
  Administered 2022-06-08: 3 mg via ORAL
  Administered 2022-06-09: 4 mg via ORAL
  Administered 2022-06-09 (×2): 2 mg via ORAL
  Administered 2022-06-09: 1 mg via ORAL
  Administered 2022-06-09: 4 mg via ORAL
  Filled 2022-06-08: qty 3
  Filled 2022-06-08: qty 4
  Filled 2022-06-08: qty 2
  Filled 2022-06-08: qty 4
  Filled 2022-06-08 (×3): qty 2
  Filled 2022-06-08: qty 1

## 2022-06-08 MED ORDER — LORAZEPAM 2 MG/ML IJ SOLN
1.0000 mg | Freq: Once | INTRAMUSCULAR | Status: AC
  Administered 2022-06-08: 1 mg via INTRAVENOUS
  Filled 2022-06-08: qty 1

## 2022-06-08 MED ORDER — TRAZODONE HCL 50 MG PO TABS
150.0000 mg | ORAL_TABLET | Freq: Every day | ORAL | Status: DC
  Administered 2022-06-08 – 2022-06-15 (×8): 150 mg via ORAL
  Filled 2022-06-08: qty 1
  Filled 2022-06-08: qty 3
  Filled 2022-06-08 (×3): qty 1
  Filled 2022-06-08 (×3): qty 3
  Filled 2022-06-08: qty 1

## 2022-06-08 MED ORDER — GUAIFENESIN ER 600 MG PO TB12
600.0000 mg | ORAL_TABLET | Freq: Two times a day (BID) | ORAL | Status: DC | PRN
  Administered 2022-06-08 – 2022-06-09 (×2): 600 mg via ORAL
  Filled 2022-06-08 (×2): qty 1

## 2022-06-08 MED ORDER — PAROXETINE HCL 20 MG PO TABS
20.0000 mg | ORAL_TABLET | Freq: Every day | ORAL | Status: DC
  Administered 2022-06-08 – 2022-06-16 (×9): 20 mg via ORAL
  Filled 2022-06-08 (×9): qty 1

## 2022-06-08 MED ORDER — POTASSIUM CHLORIDE 20 MEQ PO PACK
40.0000 meq | PACK | Freq: Once | ORAL | Status: AC
  Administered 2022-06-08: 40 meq via ORAL
  Filled 2022-06-08: qty 2

## 2022-06-08 MED ORDER — METOPROLOL SUCCINATE ER 25 MG PO TB24
25.0000 mg | ORAL_TABLET | Freq: Every day | ORAL | Status: DC
  Administered 2022-06-08 – 2022-06-09 (×2): 25 mg via ORAL
  Filled 2022-06-08 (×2): qty 1

## 2022-06-08 MED ORDER — ADULT MULTIVITAMIN W/MINERALS CH
1.0000 | ORAL_TABLET | Freq: Every day | ORAL | Status: DC
  Administered 2022-06-08 – 2022-06-16 (×9): 1 via ORAL
  Filled 2022-06-08 (×9): qty 1

## 2022-06-08 MED ORDER — MAGNESIUM SULFATE 2 GM/50ML IV SOLN
2.0000 g | Freq: Once | INTRAVENOUS | Status: AC
  Administered 2022-06-08: 2 g via INTRAVENOUS
  Filled 2022-06-08: qty 50

## 2022-06-08 MED ORDER — THIAMINE MONONITRATE 100 MG PO TABS
100.0000 mg | ORAL_TABLET | Freq: Every day | ORAL | Status: DC
  Administered 2022-06-08 – 2022-06-16 (×9): 100 mg via ORAL
  Filled 2022-06-08 (×9): qty 1

## 2022-06-08 MED ORDER — PANTOPRAZOLE SODIUM 40 MG PO TBEC
40.0000 mg | DELAYED_RELEASE_TABLET | Freq: Every day | ORAL | Status: DC
  Administered 2022-06-08 – 2022-06-16 (×9): 40 mg via ORAL
  Filled 2022-06-08 (×9): qty 1

## 2022-06-08 MED ORDER — ATORVASTATIN CALCIUM 10 MG PO TABS
20.0000 mg | ORAL_TABLET | Freq: Every day | ORAL | Status: DC
  Administered 2022-06-08 – 2022-06-16 (×9): 20 mg via ORAL
  Filled 2022-06-08 (×9): qty 2

## 2022-06-08 MED ORDER — LOSARTAN POTASSIUM 25 MG PO TABS
25.0000 mg | ORAL_TABLET | Freq: Every day | ORAL | Status: DC
  Administered 2022-06-08 – 2022-06-09 (×2): 25 mg via ORAL
  Filled 2022-06-08 (×2): qty 1

## 2022-06-08 MED ORDER — AMIODARONE HCL IN DEXTROSE 360-4.14 MG/200ML-% IV SOLN
30.0000 mg/h | INTRAVENOUS | Status: DC
  Administered 2022-06-09 (×3): 30 mg/h via INTRAVENOUS
  Filled 2022-06-08 (×5): qty 200

## 2022-06-08 MED ORDER — POTASSIUM CHLORIDE CRYS ER 20 MEQ PO TBCR
40.0000 meq | EXTENDED_RELEASE_TABLET | Freq: Once | ORAL | Status: AC
  Administered 2022-06-08: 40 meq via ORAL
  Filled 2022-06-08: qty 2

## 2022-06-08 MED ORDER — AMIODARONE LOAD VIA INFUSION
150.0000 mg | Freq: Once | INTRAVENOUS | Status: AC
  Administered 2022-06-08: 150 mg via INTRAVENOUS
  Filled 2022-06-08: qty 83.34

## 2022-06-08 MED ORDER — THIAMINE HCL 100 MG/ML IJ SOLN
100.0000 mg | Freq: Every day | INTRAMUSCULAR | Status: DC
  Administered 2022-06-10: 100 mg via INTRAVENOUS
  Filled 2022-06-08 (×2): qty 2

## 2022-06-08 MED ORDER — ENOXAPARIN SODIUM 40 MG/0.4ML IJ SOSY
40.0000 mg | PREFILLED_SYRINGE | INTRAMUSCULAR | Status: DC
  Administered 2022-06-08 – 2022-06-10 (×3): 40 mg via SUBCUTANEOUS
  Filled 2022-06-08 (×3): qty 0.4

## 2022-06-08 MED ORDER — THIAMINE MONONITRATE 100 MG PO TABS
100.0000 mg | ORAL_TABLET | Freq: Every day | ORAL | Status: DC
Start: 2022-06-08 — End: 2022-06-08

## 2022-06-08 MED ORDER — FOLIC ACID 1 MG PO TABS
1.0000 mg | ORAL_TABLET | Freq: Every day | ORAL | Status: DC
  Administered 2022-06-08 – 2022-06-16 (×9): 1 mg via ORAL
  Filled 2022-06-08 (×9): qty 1

## 2022-06-08 MED ORDER — AMIODARONE HCL IN DEXTROSE 360-4.14 MG/200ML-% IV SOLN
60.0000 mg/h | INTRAVENOUS | Status: AC
  Administered 2022-06-08 (×2): 60 mg/h via INTRAVENOUS

## 2022-06-08 MED ORDER — FOLIC ACID 1 MG PO TABS
1.0000 mg | ORAL_TABLET | Freq: Every day | ORAL | Status: DC
Start: 2022-06-08 — End: 2022-06-08

## 2022-06-08 MED ORDER — LIDOCAINE 5 % EX PTCH
1.0000 | MEDICATED_PATCH | CUTANEOUS | Status: DC
  Administered 2022-06-08 – 2022-06-15 (×8): 1 via TRANSDERMAL
  Filled 2022-06-08 (×8): qty 1

## 2022-06-08 MED ORDER — POTASSIUM CHLORIDE 10 MEQ/100ML IV SOLN
10.0000 meq | INTRAVENOUS | Status: AC
  Administered 2022-06-08 (×2): 10 meq via INTRAVENOUS
  Filled 2022-06-08 (×2): qty 100

## 2022-06-08 MED ORDER — PROCHLORPERAZINE MALEATE 5 MG PO TABS
5.0000 mg | ORAL_TABLET | Freq: Four times a day (QID) | ORAL | Status: DC | PRN
  Administered 2022-06-08: 5 mg via ORAL
  Filled 2022-06-08 (×2): qty 1

## 2022-06-08 NOTE — Assessment & Plan Note (Addendum)
S/p AICD placement in 2015 for VT.  Presents with multiple reported AICD discharges within the last 24 hours.  Likely in the setting of medication noncompliance.  Also with EtOH use and hypokalemia.  He is hemodynamically stable and currently in NSR.  Elevated troponin in the setting of ICD discharge, no acute ischemic changes on EKG. -Admit to FMTS, attending Dr. Pollie Meyer, progressive -EP and heart failure team following, appreciate recommendations -On IV amiodarone infusion -K>4, Mg>2, replete as indicated

## 2022-06-08 NOTE — Assessment & Plan Note (Signed)
Presents with chest pain in the setting of chronic NICM.  Elevated troponins in the setting of AICD discharge.  No current chest pain/shortness of breath.  No evidence of ischemia on EKG. -Continue to monitor -Continue to trend troponin -Restart statin

## 2022-06-08 NOTE — ED Provider Notes (Signed)
[place] EMERGENCY DEPARTMENT AT [facility] Provider Note   CSN: [id number] Arrival date & time: 06/08/22  1116     History  Chief Complaint  Patient presents with   AICD Problem    Stanley [name] is a 57 y.o. male.  57 year old male with prior medical history as detailed below presents for evaluation.  Patient reports that his AICD discharged 3 times over the course the morning.  First episode was around 1015.  Per patient report, 2 subsequent shocks were administered by the device within the next hour.  Patient denies current pain or discomfort.  Patient reports that just prior to the AICD discharge he felt lightheaded.  He did have some nausea and vomited before the first defibrillation.  Patient admits that he has been noncompliant with all previously prescribed medications except for digoxin and Lasix.  He reports that he has not taken anything but digoxin and Lasix in the last month.  He also drinks on a daily basis.  He reports several drinks of whiskey daily.  His last EtOH consumption was yesterday.  The history is provided by the patient and medical records.       Home Medications Prior to Admission medications   Medication Sig Start Date End Date Taking? Authorizing Provider  allopurinol (ZYLOPRIM) 300 MG tablet Take 1 tablet (300 mg total) by mouth daily. 07/11/21   [name], NP  amiodarone (PACERONE) 200 MG tablet Take 1 tablet (200 mg total) by mouth daily. 05/17/21   Gailen Shelter, PA  Ascorbic Acid (VITAMIN C) 1000 MG tablet Take 1,000 mg by mouth daily.    [provider]  atorvastatin (LIPITOR) 20 MG tablet Take 1 tablet (20 mg total) by mouth daily. 02/19/20   [name], NP  carvedilol (COREG) 25 MG tablet TAKE 1 TABLET(25 MG) BY MOUTH TWICE DAILY WITH A MEAL 05/23/21   [name], NP  digoxin (LANOXIN) 0.125 MG tablet TAKE 1/2 TABLET EVERY DAY (DOSE CHANGE) 02/26/22   Bensimhon, Bevelyn Buckles, MD  furosemide (LASIX) 80 MG  tablet TAKE 1 TABLET BY MOUTH ON MONDAY, WEDNESDAY& FRIDAY. MAY TAKE 1 TABLET AS NEEDED FOR SWELLING 02/17/22   Graciella Freer, PA-C  hydroquinone 4 % cream Apply topically 2 (two) times daily. 09/03/21   [name], NP  isosorbide-hydrALAZINE (BIDIL) 20-37.5 MG tablet TAKE 1 TABLET BY MOUTH THREE TIMES DAILY 04/16/21   Hoy Register, MD  losartan (COZAAR) 100 MG tablet Take 1 tablet (100 mg total) by mouth daily. 03/15/19   Bensimhon, Bevelyn Buckles, MD  nitroGLYCERIN (NITROSTAT) 0.4 MG SL tablet Place 1 tablet (0.4 mg total) under the tongue every 5 (five) minutes x 3 doses as needed for chest pain. 03/22/18   Sheilah Pigeon, PA-C  pantoprazole (PROTONIX) 40 MG tablet Take 1 tablet (40 mg total) by mouth 2 (two) times daily for 30 days, THEN 1 tablet (40 mg total) daily. 02/11/22 06/10/22  [name], NP  PARoxetine (PAXIL) 20 MG tablet Take 1 tablet (20 mg total) by mouth daily. 05/28/21   [name], NP  potassium chloride SA (KLOR-CON) 20 MEQ tablet Take 3 tablets (60 mEq total) by mouth daily. 11/05/20   Graciella Freer, PA-C  sodium chloride (OCEAN) 0.65 % SOLN nasal spray Place 1 spray into both nostrils daily as needed for congestion.    [provider]  spironolactone (ALDACTONE) 25 MG tablet Take 1 tablet (25 mg total) by mouth daily. 12/08/19  Graciella Freer, PA-C  traZODone (DESYREL) 150 MG tablet Take 1 tablet (150 mg total) by mouth at bedtime. 02/10/22   [name], NP      Allergies    Lisinopril, Allopurinol, Apple juice, Entresto [sacubitril-valsartan], Other, and Shellfish allergy    Review of Systems   Review of Systems  All other systems reviewed and are negative.   Physical Exam Updated Vital Signs BP (!) 154/98   Pulse 97   Temp 99.1 F (37.3 C) (Oral)   Resp 17   Ht 5\' 5"  (1.651 m)   Wt 68 kg   SpO2 98%   BMI 24.96 kg/m  Physical Exam Vitals and nursing note reviewed.  Constitutional:      General: He is not in  acute distress.    Appearance: Normal appearance. He is well-developed.  HENT:     Head: Normocephalic and atraumatic.  Eyes:     Conjunctiva/sclera: Conjunctivae normal.     Pupils: Pupils are equal, round, and reactive to light.  Cardiovascular:     Rate and Rhythm: Normal rate and regular rhythm.     Heart sounds: Normal heart sounds.  Pulmonary:     Effort: Pulmonary effort is normal. No respiratory distress.     Breath sounds: Normal breath sounds.  Abdominal:     General: There is no distension.     Palpations: Abdomen is soft.     Tenderness: There is no abdominal tenderness.  Musculoskeletal:        General: No deformity. Normal range of motion.     Cervical back: Normal range of motion and neck supple.  Skin:    General: Skin is warm and dry.  Neurological:     General: No focal deficit present.     Mental Status: He is alert and oriented to person, place, and time.     ED Results / Procedures / Treatments   Labs (all labs ordered are listed, but only abnormal results are displayed) Labs Reviewed  I-STAT CHEM 8, ED - Abnormal; Notable for the following components:      Result Value   Potassium 3.1 (*)    Glucose, Bld 50 (*)    Calcium, Ion 1.02 (*)    TCO2 15 (*)    Hemoglobin 11.9 (*)    HCT 35.0 (*)    All other components within normal limits  CBC WITH DIFFERENTIAL/PLATELET  BASIC METABOLIC PANEL  DIGOXIN LEVEL  ETHANOL  TROPONIN I (HIGH SENSITIVITY)    EKG EKG Interpretation  Date/Time:  Monday June 08 2022 11:23:00 EDT Ventricular Rate:  100 PR Interval:  168 QRS Duration: 152 QT Interval:  391 QTC Calculation: 505 R Axis:   235 Text Interpretation: Sinus tachycardia Multiform ventricular premature complexes Biatrial enlargement Probable anterior infarct, age indeterminate Lateral leads are also involved Prolonged QT interval Artifact in lead(s) I II III aVR aVL aVF V1 V2 V3 V5 V6 Confirmed by Kristine Royal [phone]) on 06/08/2022 11:32:17  AM  Radiology No results found.  Procedures Procedures    Medications Ordered in ED Medications  potassium chloride 10 mEq in 100 mL IVPB (has no administration in time range)    ED Course/ Medical Decision Making/ A&P                             Medical Decision Making Amount and/or Complexity of Data Reviewed Labs: ordered. Radiology: ordered.  Risk Prescription drug management. Decision regarding  hospitalization.    Medical Screen Complete  This patient presented to the ED with complaint of AICD discharge.  This complaint involves an extensive number of treatment options. The initial differential diagnosis includes, but is not limited to, metabolic abnormality, arrhythmia, dig toxicity, etc.  This presentation is: Acute, Chronic, Self-Limited, Previously Undiagnosed, Uncertain Prognosis, Complicated, Systemic Symptoms, and Threat to Life/Bodily Function  Patient presents with complaint of AICD discharge x 3.  Patient also reports daily alcohol consumption.  He also reports noncompliance with all previously prescribed medications except for digoxin and Lasix for the last month.  Case discussed with cardiology.  They will consult.  Cardiology is requesting medicine admission.  Potassium repletion initiated here in the ED for hypokalemia.  Medicine service made aware of case and will evaluate for admission.     Additional history obtained: External records from outside sources obtained and reviewed including prior ED visits and prior Inpatient records.    Lab Tests:  I ordered and personally interpreted labs.    Imaging Studies ordered:  I ordered imaging studies including chest x-ray I independently visualized and interpreted obtained imaging which showed NAD I agree with the radiologist interpretation.   Cardiac Monitoring:  The patient was maintained on a cardiac monitor.  I personally viewed and interpreted the cardiac monitor which showed an  underlying rhythm of: NSR   Medicines ordered:  I ordered medication including Ativan, potassium for hypokalemia Reevaluation of the patient after these medicines showed that the patient: improved   Problem List / ED Course:  AICD discharge, hypokalemia, alcohol intoxication   Reevaluation:  After the interventions noted above, I reevaluated the patient and found that they have: improved  Disposition:  After consideration of the diagnostic results and the patients response to treatment, I feel that the patent would benefit from admission.   CRITICAL CARE Performed by: Wynetta Fines   Total critical care time: 30 minutes  Critical care time was exclusive of separately billable procedures and treating other patients.  Critical care was necessary to treat or prevent imminent or life-threatening deterioration.  Critical care was time spent personally by me on the following activities: development of treatment plan with patient and/or surrogate as well as nursing, discussions with consultants, evaluation of patient's response to treatment, examination of patient, obtaining history from patient or surrogate, ordering and performing treatments and interventions, ordering and review of laboratory studies, ordering and review of radiographic studies, pulse oximetry and re-evaluation of patient's condition.          Final Clinical Impression(s) / ED Diagnoses Final diagnoses:  AICD discharge  Alcoholic intoxication without complication (HCC)  Hypokalemia    Rx / DC Orders ED Discharge Orders     None         Wynetta Fines, MD 06/08/22 1623

## 2022-06-08 NOTE — ED Notes (Signed)
MD Messick notified of BG, RN told to give apple juice and sandwich and to recheck in 30 minutes.

## 2022-06-08 NOTE — Assessment & Plan Note (Addendum)
Recently noncompliant with Coreg, losartan, BiDil, spironolactone.  Reports taking digoxin and Lasix although his digoxin level was low on admission.  Euvolemic on exam today. CXR with vascular congestion. -Appreciate cardiology recommendations -Restart beta-blocker (Toprol), losartan.  Slowly add back GDMT per cardiology -Obtain echo

## 2022-06-08 NOTE — Assessment & Plan Note (Addendum)
Patient reports significant EtOH use (reports about 2 shots of liquor per hour on average).  He is at risk for withdrawal during this hospitalization.  Currently, no evidence of withdrawal.  Last drink reported around 0200 06/08/2022.  Macrocytic anemia likely 2/2 folate deficiency.  EtOH level 104 on admission. -CIWA protocol with Ativan  -Thiamine and folate -Appreciate TOC

## 2022-06-08 NOTE — Assessment & Plan Note (Signed)
Pt on Paxil 20mg qd at home, has not been taking recently. Reports heavy alcohol use in the setting of many environmental/familial stressors.  -Restart Paxil -Appreciate TOC 

## 2022-06-08 NOTE — Assessment & Plan Note (Addendum)
Presents with generalized abdominal pain, mostly epigastric.  Also has abdominal distention.  Does report some vomiting within the last week.  Was admitted in 05/2021 for upper GI bleed, EGD with [facility] showed benign-appearing esophageal stenosis, 2 cm hiatal hernia, nonbleeding gastric ulcers with no stigmata of bleeding.  Suspect his current presentation may be in the setting of worsening PUD versus pancreatitis versus hepatobiliary etiology. Scleral icterus present on exam to suggest he may have chronic liver disease, which could be explained by his heavy EtOH use. -Lipase, hepatic function panel -RUQ ultrasound -Restart home Protonix -AM CMP, PT/INR

## 2022-06-08 NOTE — Consult Note (Incomplete)
Advanced Heart Failure Team Consult Note   Primary Physician: [name], NP PCP-Cardiologist:  Arvilla Meres, MD  Reason for Consultation: Acute on chronic systolic heart failure  HPI:    Stanley [name] is seen today for evaluation of acute on chronic systolic heart failure at the request of Dr. Lalla Brothers, Electrophysiology.   Stanley [name] is a 57 y.o. male with systolic HF due to NICM d/x'd in 2006 (felt 2/2 HTN/ETOH), ? CAD s/p PCI to unknown vessel 2011 at outside location (cath 2014 without CAD and normal coronaries by CT 07/2016), VT s/p [facility] ICD implant ([place] 5409), noncompliance, chronic noncardiac chest pain, depression, HTN and ETOH abuse.     In 2017 he received inappropriate therapy for ST with ATP (after a work out at [employer] and reported noncompliance with meds) that degenerated into VT and received several shocks. He has had issues with chronic chest pain without evidence for ischemic etiology including normal cath and cardiac CT as above. This is relatively unchanged. Echo 03/2016  EF 35-40%, grade 1 DD, mild MR, moderate LAE   Had recurrent ICD shock in 3/20 in setting of medicine noncompliance. Amio continued. Echo 3/20 EF 25-30% Normal RV  S/p barostim 7/21.   Now admitted w/ recurrent VT. He self activated 911 on 6/3 after receiving multiple shocks from his ICD. He was near syncopal with the first shock. He had been drinking heavily. He drinks at least 1/5 a gallon a day. He has not been taking any medications for at least 6 months because of depression. Last seen in [facility] 1/23.   He was noted to be intoxicated on admit. Ethanol level 104. EP consulted. VT felt likely triggered by alcohol intoxication and associated electrolyte derangements + medical noncompliance. Initial  K 3.1. Mg critically low at 1.2. Admit EKG showed sinus tach 118 bpm w/ some barostim artifact, LBBB QRS 142 ms, QTc 518 ms. Hs trop 41>>229>>304>>223. Admitted by IM, placed on CIWA  protocol. Started on amio gtt and electrolyte supplementation. AHF team asked to assist w/ re-initiation of GDMT.  He developed respiratory distress>>hypoxic respiratory failure overnight. O2 sats dropped in the 80s, supp O2 requirements increased to 15L and ultimately escalated to BiPAP. BP markedly elevated 160s/110s. Given dose of IV Lasix. CXR this morning shows pulmonary edema. CT ordered to r/o PE. Also w/ low grade fever, mTemp 99.5 overnight. WBC increasing, 4.4>>10.8.   Remains on BiPAP currently. Starting to diurese w/ IV Lasix. Denies any current CP. No VT on tele. K 4.1. Mg 1.8  SCr 1.13  Echo ordered.    Home Medications Prior to Admission medications   Medication Sig Start Date End Date Taking? Authorizing Provider  allopurinol (ZYLOPRIM) 300 MG tablet Take 1 tablet (300 mg total) by mouth daily. 07/11/21   [name], NP  amiodarone (PACERONE) 200 MG tablet Take 1 tablet (200 mg total) by mouth daily. 05/17/21   Gailen Shelter, PA  Ascorbic Acid (VITAMIN C) 1000 MG tablet Take 1,000 mg by mouth daily.    [provider]  atorvastatin (LIPITOR) 20 MG tablet Take 1 tablet (20 mg total) by mouth daily. 02/19/20   [name], NP  carvedilol (COREG) 25 MG tablet TAKE 1 TABLET(25 MG) BY MOUTH TWICE DAILY WITH A MEAL 05/23/21   [name], NP  digoxin (LANOXIN) 0.125 MG tablet TAKE 1/2 TABLET EVERY DAY (DOSE CHANGE) 02/26/22   Bensimhon, Bevelyn Buckles, MD  furosemide (LASIX) 80 MG tablet  TAKE 1 TABLET BY MOUTH ON MONDAY, WEDNESDAY& FRIDAY. MAY TAKE 1 TABLET AS NEEDED FOR SWELLING 02/17/22   Graciella Freer, PA-C  hydroquinone 4 % cream Apply topically 2 (two) times daily. 09/03/21   [name], NP  isosorbide-hydrALAZINE (BIDIL) 20-37.5 MG tablet TAKE 1 TABLET BY MOUTH THREE TIMES DAILY 04/16/21   Hoy Register, MD  losartan (COZAAR) 100 MG tablet Take 1 tablet (100 mg total) by mouth daily. 03/15/19   Bensimhon, Bevelyn Buckles, MD  nitroGLYCERIN (NITROSTAT) 0.4  MG SL tablet Place 1 tablet (0.4 mg total) under the tongue every 5 (five) minutes x 3 doses as needed for chest pain. 03/22/18   Sheilah Pigeon, PA-C  pantoprazole (PROTONIX) 40 MG tablet Take 1 tablet (40 mg total) by mouth 2 (two) times daily for 30 days, THEN 1 tablet (40 mg total) daily. 02/11/22 06/10/22  [name], NP  PARoxetine (PAXIL) 20 MG tablet Take 1 tablet (20 mg total) by mouth daily. 05/28/21   [name], NP  potassium chloride SA (KLOR-CON) 20 MEQ tablet Take 3 tablets (60 mEq total) by mouth daily. 11/05/20   Graciella Freer, PA-C  sodium chloride (OCEAN) 0.65 % SOLN nasal spray Place 1 spray into both nostrils daily as needed for congestion.    [provider]  spironolactone (ALDACTONE) 25 MG tablet Take 1 tablet (25 mg total) by mouth daily. 12/08/19   Graciella Freer, PA-C  traZODone (DESYREL) 150 MG tablet Take 1 tablet (150 mg total) by mouth at bedtime. 02/10/22   [name], NP    Past Medical History: Past Medical History:  Diagnosis Date   Acute renal failure (ARF) (HCC)    Acute respiratory failure with hypoxia (HCC) 12/29/2018   Alcohol abuse    Anxiety    CAD (coronary artery disease)    a. dx unclear, reported PCI in 2011 but cath 2014 normal coronaries and cardiac CT 07/2016 normal coronaries, no mention of stents.   Chronic chest pain    Chronic systolic CHF (congestive heart failure) (HCC)    Depression    Dyspnea    07/05/2019: per patient some times with exertion, "has to catch breath"   Family history of breast cancer 05/30/2021   Family history of pancreatic cancer 05/30/2021   Financial difficulties    Gout    Gouty arthritis    Headache    History of noncompliance with medical treatment    financial challenges   Hypertension    ICD (implantable cardioverter-defibrillator) in place    [employer] ICD implant done in [place] 2015   Insomnia    Lobar pneumonia (HCC) 12/28/2018   Myocardial infarction (HCC) 2005    Nonischemic cardiomyopathy (HCC)    Sleep apnea    per patient, has CPAP does not wear it every night   Ventricular tachycardia (HCC) 01/2015   2017 - ATP delivered for sinus tach, degenerated into VT for which he received shock. Recurrent VT 03/2018.    Past Surgical History: Past Surgical History:  Procedure Laterality Date   BAROREFLEX SYSTEM INSERTION     BIOPSY  03/06/2021   Procedure: BIOPSY;  Surgeon: Benancio Deeds, MD;  Location: [facility];  Service: Gastroenterology;;   CARDIAC DEFIBRILLATOR PLACEMENT  06/05/2013   [employer]ogen ICD implanted at [facility] in [place] for primary prevention   COLONOSCOPY WITH PROPOFOL N/A 03/06/2021   Procedure: COLONOSCOPY WITH PROPOFOL;  Surgeon: Benancio Deeds, MD;  Location:  [facility];  Service: Gastroenterology;  Laterality: N/A;   ESOPHAGEAL DILATION  03/06/2021   Procedure: ESOPHAGEAL DILATION;  Surgeon: Benancio Deeds, MD;  Location: [facility];  Service: Gastroenterology;;   ESOPHAGOGASTRODUODENOSCOPY (EGD) WITH PROPOFOL N/A 03/06/2021   Procedure: ESOPHAGOGASTRODUODENOSCOPY (EGD) WITH PROPOFOL;  Surgeon: Benancio Deeds, MD;  Location: [facility];  Service: Gastroenterology;  Laterality: N/A;   ESOPHAGOGASTRODUODENOSCOPY (EGD) WITH PROPOFOL N/A 05/18/2021   Procedure: ESOPHAGOGASTRODUODENOSCOPY (EGD) WITH PROPOFOL;  Surgeon: Sherrilyn Rist, MD;  Location: [facility] ENDOSCOPY;  Service: Gastroenterology;  Laterality: N/A;   HERNIA REPAIR     PERCUTANEOUS CORONARY STENT INTERVENTION (PCI-S)  01/05/2009   POLYPECTOMY  03/06/2021   Procedure: POLYPECTOMY;  Surgeon: Benancio Deeds, MD;  Location: [facility];  Service: Gastroenterology;;    Family History: Family History  Problem Relation Age of Onset   Breast cancer Mother    Pancreatic cancer Mother    Hypertension Father    Alzheimer's disease Father    Gout Father    Dementia Father    Hypertension Sister    Clotting disorder  Sister    Obesity Brother    Bronchitis Brother    Cancer Maternal Uncle        unknown type; dx after 8; x2 maternal uncles   Breast cancer Paternal Aunt        dx after 50   Lung cancer Paternal Uncle    Heart disease Maternal Grandmother    Gout Paternal Grandfather    Colon polyps Neg Hx    Colon cancer Neg Hx    Esophageal cancer Neg Hx    Stomach cancer Neg Hx     Social History: Social History   Socioeconomic History   Marital status: Divorced    Spouse name: Not on file   Number of children: 1   Years of education: 12   Highest education level: Not on file  Occupational History   Occupation: Disability  Tobacco Use   Smoking status: Never   Smokeless tobacco: Never  Vaping Use   Vaping Use: Never used  Substance and Sexual Activity   Alcohol use: Not Currently    Comment: Admits to ongoing heavy drinking   Drug use: No   Sexual activity: Not Currently  Other Topics Concern   Not on file  Social History Narrative   Lives in [place] alone.  Disabled.  Previously worked as a Geophysicist/field seismologist.   Fun: Rest, Read and watch movies.    Social Determinants of Health   Financial Resource Strain: Medium Risk (03/21/2018)   Overall Financial Resource Strain (CARDIA)    Difficulty of Paying Living Expenses: Somewhat hard  Food Insecurity: Food Insecurity Present (03/21/2018)   Hunger Vital Sign    Worried About Running Out of Food in the Last Year: Sometimes true    Ran Out of Food in the Last Year: Sometimes true  Transportation Needs: No Transportation Needs (03/21/2018)   PRAPARE - Administrator, Civil Service (Medical): No    Lack of Transportation (Non-Medical): No  Physical Activity: Unknown (03/21/2018)   Exercise Vital Sign    Days of Exercise per Week: Patient declined    Minutes of Exercise per Session: Patient declined  Stress: Stress Concern Present (03/21/2018)   [employer] of Occupational Health - Occupational Stress  Questionnaire    Feeling of Stress : To some extent  Social Connections: Unknown (03/21/2018)   Social Connection and Isolation Panel [NHANES]    Frequency of  Communication with Friends and Family: Patient declined    Frequency of Social Gatherings with Friends and Family: Patient declined    Attends Religious Services: Patient declined    Database administrator or Organizations: Patient declined    Attends Banker Meetings: Patient declined    Marital Status: Patient declined    Allergies:  Allergies  Allergen Reactions   Lisinopril Shortness Of Breath    Makes lips swell   Allopurinol Nausea Only    dizziness   Entresto [Sacubitril-Valsartan]     History of angioedema   Other Swelling    Nuts - Lip Swelling   Shellfish Allergy Swelling    Lip Swelling    Objective:    Vital Signs:   Temp:  [97.8 F (36.6 C)-99.5 F (37.5 C)] 97.8 F (36.6 C) (06/04 0922) Pulse Rate:  [92-121] 114 (06/04 0922) Resp:  [17-34] 30 (06/04 0922) BP: (151-167)/(92-112) 151/107 (06/04 0922) SpO2:  [88 %-99 %] 99 % (06/04 0922) FiO2 (%):  [100 %] 100 % (06/04 0718) Weight:  [63 kg-68 kg] 63 kg (06/04 0438) Last BM Date : 06/08/22  Weight change: Filed Weights   06/08/22 1123 06/08/22 1759 06/09/22 0438  Weight: 68 kg 66.5 kg 63 kg    Intake/Output:   Intake/Output Summary (Last 24 hours) at 06/09/2022 1038 Last data filed at 06/09/2022 1610 Gross per 24 hour  Intake 450 ml  Output 700 ml  Net -250 ml      Physical Exam    General:  requiring BiPAP. No acute distress.  HEENT: normal Neck: supple. JVD elevated to jaw. Carotids 2+ bilat; no bruits. No lymphadenopathy or thyromegaly appreciated. Cor: PMI nondisplaced. Regular rhythm, tachy rate. No rubs, gallops or murmurs. Lungs: Bilateral crackles R>L Abdomen: soft, nontender, nondistended. No hepatosplenomegaly. No bruits or masses. Good bowel sounds. Extremities: no cyanosis, clubbing, rash, edema  Neuro: alert &  oriented x 3, cranial nerves grossly intact. moves all 4 extremities w/o difficulty. Affect pleasant.   Telemetry   Sinus tach 110s. No VT   EKG    Admit EKG sinus tach 118 bpm w/ some barostim artifact, LBBB QRS 142 ms, QTc 518 ms  Labs   Basic Metabolic Panel: Recent Labs  Lab 06/08/22 1132 06/08/22 1206 06/08/22 1410 06/09/22 0128  NA 140 142  --  133*  K 3.1* 3.1*  --  4.1  CL 106 111  --  101  CO2 15*  --   --  18*  GLUCOSE 54* 50*  --  152*  BUN 15 14  --  11  CREATININE 1.12 1.00  --  1.13  CALCIUM 8.9  --   --  8.8*  MG  --   --  1.2* 1.8    Liver Function Tests: Recent Labs  Lab 06/08/22 1326 06/09/22 0128  AST 312* 235*  ALT 174* 177*  ALKPHOS 72 102  BILITOT 1.6* 2.1*  PROT 6.1* 6.9  ALBUMIN 3.3* 3.6   Recent Labs  Lab 06/08/22 1326  LIPASE 60*   No results for input(s): "AMMONIA" in the last 168 hours.  CBC: Recent Labs  Lab 06/08/22 1132 06/08/22 1206 06/09/22 0128  WBC 4.4  --  10.5  NEUTROABS 3.2  --   --   HGB 11.9* 11.9* 13.0  HCT 35.2* 35.0* 37.9*  MCV 105.4*  --  98.7  PLT 156  --  184    Cardiac Enzymes: No results for input(s): "CKTOTAL", "CKMB", "CKMBINDEX", "TROPONINI" in the  last 168 hours.  BNP: BNP (last 3 results) No results for input(s): "BNP" in the last 8760 hours.  ProBNP (last 3 results) No results for input(s): "PROBNP" in the last 8760 hours.   CBG: Recent Labs  Lab 06/08/22 1304 06/08/22 1353  GLUCAP 52* 84    Coagulation Studies: Recent Labs    06/09/22 0128  LABPROT 13.3  INR 1.0     Imaging   US Abdomen Limited RUQ (LIVER/GB)  Result Date: 06/08/2022 CLINICAL DATA:  Abdominal fullness EXAM: ULTRASOUND ABDOMEN LIMITED RIGHT UPPER QUADRANT COMPARISON:  CT abdomen and pelvis dated 06/30/2021 FINDINGS: Gallbladder: No wall thickening visualized. Cholelithiasis. No sonographic Murphy sign noted by sonographer. Common bile duct: Diameter: 5 mm Liver: Diffusely increased hepatic echogenicity  can be seen in the setting of infiltrative process such as steatosis. Portal vein is patent on color Doppler imaging with normal direction of blood flow towards the liver. Other: None. IMPRESSION: 1. Cholelithiasis without sonographic evidence of acute cholecystitis. 2. Diffusely increased hepatic echogenicity can be seen in the setting of infiltrative process such as steatosis. Electronically Signed   By: Agustin Cree M.D.   On: 06/08/2022 17:40   DG Chest Port 1 View  Result Date: 06/08/2022 CLINICAL DATA:  Shortness of breath EXAM: PORTABLE CHEST 1 VIEW COMPARISON:  X-ray 05/17/2021 FINDINGS: Left upper chest defibrillator with leads along the right side of the heart. Stable borderline size heart. Slight central vascular congestion. Hyperinflation. No consolidation, pneumothorax or effusion. Overlapping cardiac leads. Separate battery pack along the right upper chest with leads extending towards the right neck. IMPRESSION: Defibrillator. Borderline size heart with central vascular congestion. Electronically Signed   By: Karen Kays M.D.   On: 06/08/2022 14:34     Medications:     Current Medications:  atorvastatin  20 mg Oral Daily   enoxaparin (LOVENOX) injection  40 mg Subcutaneous Q24H   folic acid  1 mg Oral Daily   furosemide  80 mg Intravenous TID   lidocaine  1 patch Transdermal Q24H   losartan  25 mg Oral Daily   metoprolol succinate  25 mg Oral Daily   multivitamin with minerals  1 tablet Oral Daily   pantoprazole  40 mg Oral Daily   PARoxetine  20 mg Oral Daily   potassium chloride  40 mEq Oral BID   thiamine  100 mg Intravenous Daily   thiamine  100 mg Oral Daily   traZODone  150 mg Oral QHS    Infusions:  amiodarone 30 mg/hr (06/09/22 0048)      Patient Profile   Stanley [name] is a 57 y.o. male with systolic HF due to NICM d/x'd in 2006 (felt 2/2 HTN/ETOH), ? CAD s/p PCI to unknown vessel 2011 at outside location (cath 2014 without CAD and normal coronaries by CT  07/2016), VT s/p [facility] ICD implant ([place] 2015), noncompliance, chronic noncardiac chest pain, depression, HTN and ETOH abuse.  Admitted with VT with ICD shocks in the setting of acute decompensated HF, med noncompliance, ETOH intoxication and electrolyte derangements. AHF to see for acute on chronic systolic heart failure.  Assessment/Plan   1. VT w/ Multiple ICD Shocks  - s/p [employer] ICD - VT felt likely triggered by alcohol intoxication and associated electrolyte derangements + medical noncompliance, in the setting of underling cardiomyopathy. Initial  K 3.1. Mg critically low at 1.2. Admit EKG showed sinus tach 118 bpm w/ some barostim artifact, LBBB QRS 142 ms, QTc 518 ms. Hs trop 41>>229>>304>>223. Denies  CP  - no further VT since admit. Continue amio gtt  - K improved, 4.1 today. Mg up to 1.8  - give additional lyte supp w/ diuresis, keep K > 4.0 and Mg > 2.0  - EP following - Update Echo   Acute on Chronic Systolic Heart Failure w/ Flash Pulmonary Edema  - Mostly NICM (? H/o PCI to unknown vessel in 2011.) CTA 2018 normal cors - s/p [employer] ICD - EF 25-30% echo 3/20 - Echo 03/10/19 EF 25-30% RV ok Mild MR - Myoview 5/22: EF 34% fixed inferior defect - suspect exacerbation in setting of medication non compliance and ETOH abuse.  - developed acute flash pulmonary edema this morning, now on BiPAP. BP elevated - diurese w/ IV Lasix, 80 mg tid. Monitor K and Mg closely  - add spiro 12.5 mg daily  - Continue Losartan 25 mg. No Entresto given h/o angioedema w/ ACE-I  - Continue Metoprolol 25 mg daily - Update echo  - needs to improve compliance w/ meds and reduce ETOH use. Reiterated this today   HTN - off GDMT for ~6 months - BP elevated, restarting GDMT per above   CKD 3  - baseline SCr 0.9-1.1 - stable at 1.13 today  - follow BMP w/ diuresis   CAD - h/o single vessel PCI by report - Coronary CT  7/18 No CAD. CAC = 0  - Myoview 5/22: EF 34% fixed inferior defect - Hs trop  41>>229>>304>>223. Denies CP. Suspect trop elevation 2/2 demand ischemia from VT/ CHF  - w/ downward trop trend and absence of CP, no plans for LHC  - continue ASA/statin/? blocker  Alcohol abuse/ Entoxication  - ethyl level 104 on admission - CIWA protocol per primary team  - cessation encouraged  Depression - affects med compliance - treatment per primary team  - will engage HF SW team to assess SDOH as some barriers may be contributory     Length of Stay: 0  Brittainy Simmons, PA-C  06/09/2022, 10:38 AM  Advanced Heart Failure Team Pager [phone] (M-F; 7a - 5p)  Please contact [facility] for night-coverage after hours (4p -7a ) and weekends on [email or web address]  Patient seen with PA, agree with the above note.   He was admitted with VT and ICD shock in setting of heavy ETOH abuse and no medications.  He has been off all meds x 6 months. HS-TnI mildly elevated with no trend in setting of VT and CHF exacerbation. Patient was hypokalemic at baseline.   Overnight, he developed worsening dyspnea and went on Bipap.  CXR with pulmonary edema.  Now on HFNC.  BP elevated.  Currently in sinus tachy 100s on amiodarone gtt.   General: NAD Neck: JVP 14-16 cm, no thyromegaly or thyroid nodule.  Lungs: Crackles at bases.  CV: Lateral PMI.  Heart mildly tachy, regular S1/S2, +S3. 2/6 HSM LLSB/apex.  No peripheral edema.  No carotid bruit.  Normal pedal pulses.  Abdomen: Soft, nontender, no hepatosplenomegaly, no distention.  Skin: Intact without lesions or rashes.  Neurologic: Alert and oriented x 3.  Psych: Normal affect. Extremities: No clubbing or cyanosis.  HEENT: Normal.   1. VT: Shocks from [employer] ICD, now in NSR on amiodarone gtt. No prior chest pain, prior coronary workups have shown no significant coronary disease. HS-TnI mildly elevated with no trend, suspect demand ischemia from volume overload and VT arrest.  Suspect arrest was due to underlying NICM, off meds, with  hypokalemia and worsening CHF.   -  Continue amiodarone gtt.   - Replace K and Mg.  2. Acute on chronic systolic CHF: Patient with history of NICM, last coronary evaluation was CTA in 2018 with normal coronaries and calcium score 0.  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  Most recent prior echo in 2021 with EF 25-30%.  Echo this admission with EF < 20%, mild RV dysfunction, moderate-severe TR, moderate MR.  On exam, he is volume overloaded.  CXR with CHF. BP elevated.  - Lasix 80 mg IV every 8 hrs and replace K.  - Losartan 25 mg daily, cannot have ARNI with angioedema with ACEI.  - Add spironolactone 12.5 daily.  - Can add Bidil 0.5 tab tid and titrate up.  - Would hold Toprol XL with pulmonary edema.  - Farxiga 10 mg daily.  - RHC to assess filling pressures and cardiac output, can do this tomorrow after some diuresis.  Discussed risks/benefits with patient and he agrees to procedure.  - Not candidate for advanced therapies at this time with heavy ETOH abuse.  3. ETOH abuse: Intoxicated at admission. Heavy ETOH use, likely contributes to cardiomyopathy.  - CIWA protocol.  - Patient is motivated to quit.   Marca Ancona 06/09/2022

## 2022-06-08 NOTE — ED Notes (Signed)
ED TO INPATIENT HANDOFF REPORT  ED Nurse Name and Phone #: Ailene Rud  S Name/Age/Gender Stanley [name] 57 y.o. male Room/Bed: 027C/027C  Code Status   Code Status: Full Code  Home/SNF/Other Home Patient oriented to: self, place, time, and situation Is this baseline? Yes   Triage Complete: Triage complete  Chief Complaint AICD discharge [Z45.02]  Triage Note Pt brought in by EMS after his d-fib fired 3 times this morning, first episode around 1015. Last time this occurred was around 3 years ago. Prior to the episodes pt reports he was feeling light headed/'not right'/dizzy. Now denies all symptoms other than some generalized weakness.   Allergies Allergies  Allergen Reactions   Lisinopril Shortness Of Breath    Makes lips swell   Allopurinol Nausea Only    dizziness   Apple Juice Swelling    Causes Gout to flare up    Entresto [Sacubitril-Valsartan]     History of angioedema   Other Swelling    Nuts - Lip Swelling   Shellfish Allergy Swelling    Lip Swelling    Level of Care/Admitting Diagnosis ED Disposition     ED Disposition  Admit   Condition  --   Comment  Hospital Area: [facility] [[id number]]  Level of Care: Telemetry Medical [104]  May place patient in observation at [facility] or [facility] if equivalent level of care is available:: No  Covid Evaluation: Asymptomatic - no recent exposure (last 10 days) testing not required  Diagnosis: AICD discharge [722326]  Admitting Physician: Vonna Drafts [[id number]]  Attending Physician: Latrelle Dodrill [phone]          B Medical/Surgery History Past Medical History:  Diagnosis Date   Acute renal failure (ARF) (HCC)    Acute respiratory failure with hypoxia (HCC) 12/29/2018   Alcohol abuse    Anxiety    CAD (coronary artery disease)    a. dx unclear, reported PCI in 2011 but cath 2014 normal coronaries and cardiac CT 07/2016 normal coronaries, no mention of stents.    Chronic chest pain    Chronic systolic CHF (congestive heart failure) (HCC)    Depression    Dyspnea    07/05/2019: per patient some times with exertion, "has to catch breath"   Family history of breast cancer 05/30/2021   Family history of pancreatic cancer 05/30/2021   Financial difficulties    Gout    Gouty arthritis    Headache    History of noncompliance with medical treatment    financial challenges   Hypertension    ICD (implantable cardioverter-defibrillator) in place    [place] ICD implant done in [place] 2015   Insomnia    Lobar pneumonia (HCC) 12/28/2018   Myocardial infarction (HCC) 2005   Nonischemic cardiomyopathy (HCC)    Sleep apnea    per patient, has CPAP does not wear it every night   Ventricular tachycardia (HCC) 01/2015   2017 - ATP delivered for sinus tach, degenerated into VT for which he received shock. Recurrent VT 03/2018.   Past Surgical History:  Procedure Laterality Date   BAROREFLEX SYSTEM INSERTION     BIOPSY  03/06/2021   Procedure: BIOPSY;  Surgeon: Benancio Deeds, MD;  Location: [facility];  Service: Gastroenterology;;   CARDIAC DEFIBRILLATOR PLACEMENT  06/05/2013   [employer] Inogen ICD implanted at [facility] in [place] for primary prevention   COLONOSCOPY WITH PROPOFOL N/A 03/06/2021   Procedure: COLONOSCOPY WITH PROPOFOL;  Surgeon:  Armbruster, Willaim Rayas, MD;  Location: [facility];  Service: Gastroenterology;  Laterality: N/A;   ESOPHAGEAL DILATION  03/06/2021   Procedure: ESOPHAGEAL DILATION;  Surgeon: Benancio Deeds, MD;  Location: [facility];  Service: Gastroenterology;;   ESOPHAGOGASTRODUODENOSCOPY (EGD) WITH PROPOFOL N/A 03/06/2021   Procedure: ESOPHAGOGASTRODUODENOSCOPY (EGD) WITH PROPOFOL;  Surgeon: Benancio Deeds, MD;  Location: [facility];  Service: Gastroenterology;  Laterality: N/A;   ESOPHAGOGASTRODUODENOSCOPY (EGD) WITH PROPOFOL N/A 05/18/2021   Procedure: ESOPHAGOGASTRODUODENOSCOPY (EGD) WITH  PROPOFOL;  Surgeon: Sherrilyn Rist, MD;  Location: [facility] ENDOSCOPY;  Service: Gastroenterology;  Laterality: N/A;   HERNIA REPAIR     PERCUTANEOUS CORONARY STENT INTERVENTION (PCI-S)  01/05/2009   POLYPECTOMY  03/06/2021   Procedure: POLYPECTOMY;  Surgeon: Benancio Deeds, MD;  Location: [facility];  Service: Gastroenterology;;     A IV Location/Drains/Wounds Patient Lines/Drains/Airways Status     Active Line/Drains/Airways     Name Placement date Placement time Site Days   Peripheral IV 06/08/22 18 G Left Antecubital 06/08/22  --  Antecubital  less than 1   Peripheral IV 06/08/22 20 G Left;Posterior Forearm 06/08/22  1451  Forearm  less than 1   Incision (Closed) 07/07/19 Neck Right 07/07/19  0847  -- 1067            Intake/Output Last 24 hours  Intake/Output Summary (Last 24 hours) at 06/08/2022 1618 Last data filed at 06/08/2022 1406 Gross per 24 hour  Intake 100 ml  Output --  Net 100 ml    Labs/Imaging Results for orders placed or performed during the hospital encounter of 06/08/22 (from the past 48 hour(s))  CBC with Differential     Status: Abnormal   Collection Time: 06/08/22 11:32 AM  Result Value Ref Range   WBC 4.4 4.0 - 10.5 K/uL   RBC 3.34 (L) 4.22 - 5.81 MIL/uL   Hemoglobin 11.9 (L) 13.0 - 17.0 g/dL   HCT 16.1 (L) 09.6 - 04.5 %   MCV 105.4 (H) 80.0 - 100.0 fL   MCH 35.6 (H) 26.0 - 34.0 pg   MCHC 33.8 30.0 - 36.0 g/dL   RDW 40.9 81.1 - 91.4 %   Platelets 156 150 - 400 K/uL   nRBC 0.0 0.0 - 0.2 %   Neutrophils Relative % 72 %   Neutro Abs 3.2 1.7 - 7.7 K/uL   Lymphocytes Relative 17 %   Lymphs Abs 0.8 0.7 - 4.0 K/uL   Monocytes Relative 8 %   Monocytes Absolute 0.3 0.1 - 1.0 K/uL   Eosinophils Relative 1 %   Eosinophils Absolute 0.0 0.0 - 0.5 K/uL   Basophils Relative 1 %   Basophils Absolute 0.0 0.0 - 0.1 K/uL   Immature Granulocytes 1 %   Abs Immature Granulocytes 0.02 0.00 - 0.07 K/uL    Comment: Performed at [facility], [address] [place], [place] [address]  Troponin I (High Sensitivity)     Status: Abnormal   Collection Time: 06/08/22 11:32 AM  Result Value Ref Range   Troponin I (High Sensitivity) 41 (H) <18 ng/L    Comment: (NOTE) Elevated high sensitivity troponin I (hsTnI) values and significant  changes across serial measurements may suggest ACS but many other  chronic and acute conditions are known to elevate hsTnI results.  Refer to the "Links" section for chest pain algorithms and additional  guidance. Performed at [facility], [address] [place], [place] [address]   Basic metabolic panel  Status: Abnormal   Collection Time: 06/08/22 11:32 AM  Result Value Ref Range   Sodium 140 135 - 145 mmol/L   Potassium 3.1 (L) 3.5 - 5.1 mmol/L   Chloride 106 98 - 111 mmol/L   CO2 15 (L) 22 - 32 mmol/L   Glucose, Bld 54 (L) 70 - 99 mg/dL    Comment: Glucose reference range applies only to samples taken after fasting for at least 8 hours.   BUN 15 6 - 20 mg/dL   Creatinine, Ser 1.61 0.61 - 1.24 mg/dL   Calcium 8.9 8.9 - 09.6 mg/dL   GFR, Estimated >04 >54 mL/min    Comment: (NOTE) Calculated using the CKD-EPI Creatinine Equation (2021)    Anion gap 19 (H) 5 - 15    Comment: ELECTROLYTES REPEATED TO VERIFY Performed at [facility], [address], [place], [place] [address]   Digoxin level     Status: Abnormal   Collection Time: 06/08/22 11:32 AM  Result Value Ref Range   Digoxin Level <0.2 (L) 0.8 - 2.0 ng/mL    Comment: RESULT CONFIRMED BY MANUAL DILUTION Performed at [facility], [address], [place], [place] [address]   Ethanol     Status: Abnormal   Collection Time: 06/08/22 11:32 AM  Result Value Ref Range   Alcohol, Ethyl (B) 104 (H) <10 mg/dL    Comment: (NOTE) Lowest detectable limit for serum alcohol is 10 mg/dL.  For medical purposes only. Performed at [facility], [address] [place], [place] [address]   I-stat chem 8, ED     Status:  Abnormal   Collection Time: 06/08/22 12:06 PM  Result Value Ref Range   Sodium 142 135 - 145 mmol/L   Potassium 3.1 (L) 3.5 - 5.1 mmol/L   Chloride 111 98 - 111 mmol/L   BUN 14 6 - 20 mg/dL   Creatinine, Ser 1.30 0.61 - 1.24 mg/dL   Glucose, Bld 50 (L) 70 - 99 mg/dL    Comment: Glucose reference range applies only to samples taken after fasting for at least 8 hours.   Calcium, Ion 1.02 (L) 1.15 - 1.40 mmol/L   TCO2 15 (L) 22 - 32 mmol/L   Hemoglobin 11.9 (L) 13.0 - 17.0 g/dL   HCT 86.5 (L) 78.4 - 69.6 %  CBG monitoring, ED     Status: Abnormal   Collection Time: 06/08/22  1:04 PM  Result Value Ref Range   Glucose-Capillary 52 (L) 70 - 99 mg/dL    Comment: Glucose reference range applies only to samples taken after fasting for at least 8 hours.  CBG monitoring, ED     Status: None   Collection Time: 06/08/22  1:53 PM  Result Value Ref Range   Glucose-Capillary 84 70 - 99 mg/dL    Comment: Glucose reference range applies only to samples taken after fasting for at least 8 hours.  Troponin I (High Sensitivity)     Status: Abnormal   Collection Time: 06/08/22  2:10 PM  Result Value Ref Range   Troponin I (High Sensitivity) 229 (HH) <18 ng/L    Comment: CRITICAL RESULT CALLED TO, READ BACK BY AND VERIFIED WITH B Shamira Toutant RN 06/08/2022 1523 BNUNNERY (NOTE) Elevated high sensitivity troponin I (hsTnI) values and significant  changes across serial measurements may suggest ACS but many other  chronic and acute conditions are known to elevate hsTnI results.  Refer to the "Links" section for chest pain algorithms and additional  guidance. Performed at [facility], [address] [place], [place] [address]   Magnesium     Status: Abnormal   Collection Time: 06/08/22  2:10 PM  Result Value Ref Range   Magnesium 1.2 (L) 1.7 - 2.4 mg/dL    Comment: Performed at [facility], [address] [place], [place] [address]   DG Chest Port 1 View  Result Date: 06/08/2022 CLINICAL  DATA:  Shortness of breath EXAM: PORTABLE CHEST 1 VIEW COMPARISON:  X-ray 05/17/2021 FINDINGS: Left upper chest defibrillator with leads along the right side of the heart. Stable borderline size heart. Slight central vascular congestion. Hyperinflation. No consolidation, pneumothorax or effusion. Overlapping cardiac leads. Separate battery pack along the right upper chest with leads extending towards the right neck. IMPRESSION: Defibrillator. Borderline size heart with central vascular congestion. Electronically Signed   By: Karen Kays M.D.   On: 06/08/2022 14:34    Pending Labs Unresulted Labs (From admission, onward)     Start     Ordered   06/09/22 0500  Comprehensive metabolic panel  Tomorrow morning,   R        06/08/22 1608   06/09/22 0500  Protime-INR  Tomorrow morning,   R        06/08/22 1608   06/09/22 0500  CBC  Tomorrow morning,   R        06/08/22 1608   06/08/22 1600  HIV Antibody (routine testing w rflx)  (HIV Antibody (Routine testing w reflex) panel)  Once,   R        06/08/22 1608   06/08/22 1545  Hepatic function panel  Add-on,   AD        06/08/22 1544   06/08/22 1524  Lipase, blood  Add-on,   AD        06/08/22 1523            Vitals/Pain Today's Vitals   06/08/22 1400 06/08/22 1455 06/08/22 1455 06/08/22 1600  BP: (!) 167/92   (!) 160/98  Pulse: 92   (!) 101  Resp: 19   (!) 21  Temp:   98.7 F (37.1 C)   TempSrc:   Oral   SpO2: 96%   96%  Weight:      Height:      PainSc:  5       Isolation Precautions No active isolations  Medications Medications  amiodarone (NEXTERONE) 1.8 mg/mL load via infusion 150 mg (150 mg Intravenous Bolus from Bag 06/08/22 1451)    Followed by  amiodarone (NEXTERONE PREMIX) 360-4.14 MG/200ML-% (1.8 mg/mL) IV infusion (60 mg/hr Intravenous New Bag/Given 06/08/22 1505)    Followed by  amiodarone (NEXTERONE PREMIX) 360-4.14 MG/200ML-% (1.8 mg/mL) IV infusion (has no administration in time range)  metoprolol succinate  (TOPROL-XL) 24 hr tablet 25 mg (has no administration in time range)  losartan (COZAAR) tablet 25 mg (has no administration in time range)  atorvastatin (LIPITOR) tablet 20 mg (has no administration in time range)  enoxaparin (LOVENOX) injection 40 mg (has no administration in time range)  folic acid (FOLVITE) tablet 1 mg (has no administration in time range)  thiamine (VITAMIN B1) tablet 100 mg (has no administration in time range)  PARoxetine (PAXIL) tablet 20 mg (has no administration in time range)  pantoprazole (PROTONIX) EC tablet 40 mg (has no administration in time range)  LORazepam (ATIVAN) tablet 1-4 mg (has no administration in time range)  thiamine (VITAMIN B1) injection 100 mg (has  no administration in time range)  multivitamin with minerals tablet 1 tablet (has no administration in time range)  potassium chloride (KLOR-CON) packet 40 mEq (has no administration in time range)  magnesium sulfate IVPB 2 g 50 mL (has no administration in time range)  potassium chloride 10 mEq in 100 mL IVPB (10 mEq Intravenous New Bag/Given 06/08/22 1407)  potassium chloride SA (KLOR-CON M) CR tablet 40 mEq (40 mEq Oral Given 06/08/22 1446)  LORazepam (ATIVAN) injection 1 mg (1 mg Intravenous Given 06/08/22 1504)    Mobility walks     Focused Assessments Cardiac Assessment Handoff:  Cardiac Rhythm: Sinus tachycardia No results found for: "CKTOTAL", "CKMB", "CKMBINDEX", "TROPONINI" Lab Results  Component Value Date   DDIMER 1.27 (H) 12/28/2018   Does the Patient currently have chest pain? Yes    R Recommendations: See Admitting Provider Note  Report given to:   Additional Notes:

## 2022-06-08 NOTE — H&P (Signed)
Hospital Admission History and Physical Service Pager: [phone]  Patient name: Stanley [name] Medical record number: [id number] Date of Birth: 06-26-1965 Age: 57 y.o. Gender: male  Primary Care Provider: [name], NP Consultants: Electrophysiology Code Status: Full which was confirmed with patient Preferred Emergency Contact:  Contact Information     Name Relation Home Work Mobile   [place] Son [phone]  [phone]   Laray Anger [phone]     Neal,Courtney Daughter [phone]  [phone]      [place]: [phone]  Chief Complaint: AICD discharge  Assessment and Plan: Stanley [name] is a 57 y.o. male presenting with AICD discharges, also with recent heavy EtOH use.  PMHx includes NICM, CHF, VT sp AICD placement, EtOH use disorder, anxiety/depression   Active Hospital Problems   *AICD discharge           S/p AICD placement in 2015 for VT.  Presents with           multiple reported AICD discharges within the last           24 hours.  Likely in the setting of medication           noncompliance.  Also with EtOH use and           hypokalemia.  He is hemodynamically stable and           currently in NSR.  Elevated troponin in the           setting of ICD discharge, no acute ischemic           changes on EKG.-Admit to FMTS, attending Dr.           Pollie Meyer, progressive-EP and heart failure team           following, appreciate recommendations           -On IV amiodarone infusion           -K>4, Mg>2, replete as indicated    ETOH abuse           Patient reports significant EtOH use (reports           about 2 shots of liquor per hour on average).  He           is at risk for withdrawal during this           hospitalization.  Currently, no evidence of           withdrawal.  Last drink reported around 0200           06/08/2022.  Macrocytic anemia likely 2/2 folate           deficiency.  EtOH level 104 on admission.            -CIWA protocol with Ativan            -Thiamine and folate           -Appreciate TOC    Abdominal pain           Presents with generalized abdominal pain, mostly           epigastric.  Also has abdominal distention.  Does           report some vomiting within the last week.  Was           admitted in 05/2021 for upper GI bleed, EGD with           [facility] showed benign-appearing esophageal  stenosis, 2 cm hiatal hernia, nonbleeding gastric           ulcers with no stigmata of bleeding.  Suspect his           current presentation may be in the setting of           worsening PUD versus pancreatitis versus           hepatobiliary etiology. Scleral icterus present on           exam to suggest he may have chronic liver disease,           which could be explained by his heavy EtOH use.           -Lipase, hepatic function panel           -RUQ ultrasound           -Restart home Protonix           -AM CMP, PT/INR    Chronic systolic heart failure (HCC)           Recently noncompliant with Coreg, losartan, BiDil,           spironolactone.  Reports taking digoxin and Lasix           although his digoxin level was low on admission.            Euvolemic on exam today. CXR with vascular           congestion.           -Appreciate cardiology recommendations           -Restart beta-blocker (Toprol), losartan.  Slowly           add back GDMT per            cardiology           -Obtain echo    Nonischemic cardiomyopathy Stanley [name])           Presents with chest pain in the setting of chronic           NICM.  Elevated troponins in the setting of AICD           discharge.  No current chest pain/shortness of           breath.  No evidence of ischemia on EKG.           -Continue to monitor           -Continue to trend troponin           -Restart statin    Anxiety and depression           Pt on Paxil 20mg  qd at home, has not been taking           recently. Reports heavy alcohol use in the  setting           of many environmental/familial stressors.            -Restart Paxil           -Appreciate [facility]   Resolved Hospital Problems No resolved problems to display.    FEN/GI: HH diet VTE Prophylaxis: lovenox  Disposition: progressive  History of Present Illness:  Stanley [name] is a 57 y.o. male presenting with AICD discharges  This morning felt like a truck was sitting on his chest, notes his AICD went off a quarter to 10AM and happened 10 minutes apart. Went off total 3 times. Denies LOC. He did go to the  bathroom feeling nauseous and vomited a little bit. Endorses SOB during the first episode. Denies caffeine, strenous exercise that may have prompted this. Denies current SOB, chest pain, leg swelling, headache, fever, recent sickness.   He has been unable to keep his food down for the last couple months. He endorses not taking his medications because he has been depressed and tired. Denies difficulty obtaining medications. He did take his Lasix and Digoxin yesterday, but not regularly.  Denies history of alcohol withdrawal. Notes he drinks due to anxiety/depression. Sister notes he has good support system. Last drink was 2-3AM  He has hx of AICD going off ~2017.   In the ED, EP was consulted and pt was started on IV amiodarone.  Review Of Systems: Per HPI.  Pertinent Past Medical History: MI (several) CAD but no hx of stent placement HTN Gout Remainder reviewed in history tab.   Pertinent Past Surgical History: Defibrillator placement Hernia repair Stent intervention 2011??  Remainder reviewed in history tab.  Pertinent Social History: Tobacco use: No Alcohol use: 1 shot every 30-60 min liquor daily, cannot declare how long (years) Other Substance use: none Lives with self  Pertinent Family History: Breast cancer - mother Pancreatic cancer - mother Remainder reviewed in history tab.   Important Outpatient Medications: Allopurinol 300mg   daily Amiodarone 200mg  daily Vitamin C 1g daily Atorvastatin 20mg  daily Carvedilol 25mg  BID Digoxin 0.125mg  (1/2 tab daily) Lasix 80mg  MWF Isosorbide-hydralazine 20-37.5mg  TID Pantoprazole 40mg  daily Paroxetine 20mg  daily Potassium chloride 60 mEq daily Spironolactone 25mg  daily Trazodone 150mg  QHS Remainder reviewed in medication history.   **of note, endorses only taking lasix and digoxin inconsistently. Everything else has not been taken.   Objective: BP (!) 160/98   Pulse 99   Temp 98.7 F (37.1 C) (Oral)   Resp (!) 22   Ht 5\' 5"  (1.651 m)   Wt 68 kg   SpO2 (!) 88% Comment: will place on 2L [place]  BMI 24.96 kg/m  Exam: General: NAD, awake, pleasant Eyes: b/l scleral icterus Cardiovascular: RRR, systolic murmur appreciated, best heard at upper sternal border and radiates to mid-back and abdomen Respiratory: CTAB normal WOB on RA Gastrointestinal: Soft, mildly distended, diffusely uncomfortable to palpation with more significant tenderness in the epigastric region MSK: No significant lower extremity edema   Labs:  CBC BMET  Recent Labs  Lab 06/08/22 1132 06/08/22 1206  WBC 4.4  --   HGB 11.9* 11.9*  HCT 35.2* 35.0*  PLT 156  --    Recent Labs  Lab 06/08/22 1132 06/08/22 1206  NA 140 142  K 3.1* 3.1*  CL 106 111  CO2 15*  --   BUN 15 14  CREATININE 1.12 1.00  GLUCOSE 54* 50*  CALCIUM 8.9  --      Alcohol: 104 Troponin: 41 Digoxin: <0.2  CBG (last 3)  Recent Labs    06/08/22 1304 06/08/22 1353  GLUCAP 52* 84     EKG: NSR, no significant ST changes  Imaging Studies Performed: CXR: Defibrillator. Borderline size heart with central vascular congestion.  Vonna Drafts, MD 06/08/2022, 4:39 PM PGY-1, [facility]  FPTS Intern pager: [phone], text pages welcome Secure chat group [facility] Teaching Service

## 2022-06-08 NOTE — Progress Notes (Signed)
FMTS Brief Progress Note  S: Went to see patient at bedside with Dr. Phineas Real.  Had previously had CIWA of 17-seems to have the most symptoms in terms of nausea and vomiting.  Otherwise doing okay.  After Ativan administration and patient seems to feel a lot better.  He states that his chest discomfort has improved since his ICD discharges. Has had a lot of coughing as well which has been irritating  O: BP (!) 162/101 (BP Location: Right Arm)   Pulse (!) 117   Temp 99.5 F (37.5 C) (Oral)   Resp 20   Ht 5\' 5"  (1.651 m)   Wt 66.5 kg   SpO2 97%   BMI 24.40 kg/m    General: NAD, awake, alert and oriented x4 Head: Normocephalic atraumatic CV: tachycardia to 110s-120s when in room, normal rhythm Chest: Mild, chest rises symmetrically,  no increased work of breathing, some tenderness to palpation on left anterior chest wall Extremities: mild tremor of fingers when extended  A/P: AICD discharge  Troponin elevated-229 to 304. One more pending.  This was discussed with cardiology earlier today and thought to be related to ICD discharges.  Patient says that chest discomfort has improved a lot.  Does have some tenderness to palpation on chest wall.  -add lidocaine patch -continue amiodarone  -CRM  ETOH abuse  CIWA has been quite elevated to 17 but improved rapidly to 2 after administration of Ativan.  He is mentating well and tremors have improved significantly.  We will monitor him closely.  Seems to be most symptomatic in terms of nausea/vomiting.  EKG did have elevated QTc to 505.  Would like to avoid QT prolonging medications at this time but will add Compazine as needed for nausea control. -compazine -CIWA  - Orders reviewed. Labs for AM ordered, which was adjusted as needed.    Levin Erp, MD 06/08/2022, 9:22 PM PGY-2,  Family Medicine Night Resident  Please page [phone] with questions.

## 2022-06-08 NOTE — ED Triage Notes (Signed)
Pt brought in by EMS after his d-fib fired 3 times this morning, first episode around 1015. Last time this occurred was around 3 years ago. Prior to the episodes pt reports he was feeling light headed/'not right'/dizzy. Now denies all symptoms other than some generalized weakness.

## 2022-06-08 NOTE — Consult Note (Signed)
ELECTROPHYSIOLOGY CONSULT NOTE    Patient ID: Stanley [name] MRN: [id number], DOB/AGE: January 18, 1965 57 y.o.  Admit date: 06/08/2022 Date of Consult: 06/08/2022  Primary Physician: [name], NP Primary Cardiologist: Arvilla Meres, MD  Electrophysiologist: Dr. Johney Frame -> Dr. Lalla Brothers   Referring Provider: Dr. Rodena Medin  Patient Profile: Stanley [name] is a 57 y.o. male with a history of chronic systolic CHF, NICM, ETOH abuse, Depression, DM2, and h/o VT on amiodarone, who is being seen today for the evaluation of ICD shock at the request of Dr. Rodena Medin.  HPI:  Stanley [name] is a 57 y.o. male with medical history as above.   Well known to this provider through managing his ICD and Barostim device, though last visit in office was 08/2021. At that time he was having mild SOB with moderate exertion. His BAT was adjusted to try and help more with his symptoms. He has had stim before, described as a fullness in his neck.   This morning he called EMS after being shocked by his device multiple times. States he had chest "pressure" prior to the ICD shocks.  Labs on arrival as below and also significant for Alcohol, Ethyl at 104 mg/dL. Pt states he drinks throughout the day most days, and had 5 shots yesterday evening before bed.  Hasn't received device therapy for > 3 years prior to this.   He reports he has not been taking his medications for "at least" 6 months.  He has been depressed and tired of taking them all.  He has only been taking digoxin and lasix, as he felt they would help him feel OK.   Episode at 1026 showed VT vs AF RVR (no history) CL 240 to 330 ms ->  with ineffective ATP, Shock from VT -> VF (CL 170 ms). Shock to sinus -> Back into VT (CL ~280 ms, with some irregularity) -> Shock to sinus.   Episode 1032 single shock for fast VT with CL ~230 ms        Labs Potassium3.1* (06/03 1206) Mg pending. Creatinine, ser  1.00 (06/03 1206) PLT  156 (06/03 1132) HGB   11.9* (06/03 1206) WBC 4.4 (06/03 1132) Troponin I (High Sensitivity)41* (06/03 1132).    Past Medical History:  Diagnosis Date   Acute renal failure (ARF) (HCC)    Acute respiratory failure with hypoxia (HCC) 12/29/2018   Alcohol abuse    Anxiety    CAD (coronary artery disease)    a. dx unclear, reported PCI in 2011 but cath 2014 normal coronaries and cardiac CT 07/2016 normal coronaries, no mention of stents.   Chronic chest pain    Chronic systolic CHF (congestive heart failure) (HCC)    Depression    Dyspnea    07/05/2019: per patient some times with exertion, "has to catch breath"   Family history of breast cancer 05/30/2021   Family history of pancreatic cancer 05/30/2021   Financial difficulties    Gout    Gouty arthritis    Headache    History of noncompliance with medical treatment    financial challenges   Hypertension    ICD (implantable cardioverter-defibrillator) in place    [employer] [employer] ICD implant done in [place] 2015   Insomnia    Lobar pneumonia (HCC) 12/28/2018   Myocardial infarction (HCC) 2005   Nonischemic cardiomyopathy (HCC)    Sleep apnea    per patient, has CPAP does not wear it every night   Ventricular tachycardia (HCC) 01/2015   2017 -  ATP delivered for sinus tach, degenerated into VT for which he received shock. Recurrent VT 03/2018.     Surgical History:  Past Surgical History:  Procedure Laterality Date   BAROREFLEX SYSTEM INSERTION     BIOPSY  03/06/2021   Procedure: BIOPSY;  Surgeon: Benancio Deeds, MD;  Location: [facility];  Service: Gastroenterology;;   CARDIAC DEFIBRILLATOR PLACEMENT  06/05/2013   [employer] Inogen ICD implanted at [facility] in [place] for primary prevention   COLONOSCOPY WITH PROPOFOL N/A 03/06/2021   Procedure: COLONOSCOPY WITH PROPOFOL;  Surgeon: Benancio Deeds, MD;  Location: [facility];  Service: Gastroenterology;  Laterality: N/A;   ESOPHAGEAL DILATION  03/06/2021   Procedure: ESOPHAGEAL  DILATION;  Surgeon: Benancio Deeds, MD;  Location: [facility];  Service: Gastroenterology;;   ESOPHAGOGASTRODUODENOSCOPY (EGD) WITH PROPOFOL N/A 03/06/2021   Procedure: ESOPHAGOGASTRODUODENOSCOPY (EGD) WITH PROPOFOL;  Surgeon: Benancio Deeds, MD;  Location: [facility];  Service: Gastroenterology;  Laterality: N/A;   ESOPHAGOGASTRODUODENOSCOPY (EGD) WITH PROPOFOL N/A 05/18/2021   Procedure: ESOPHAGOGASTRODUODENOSCOPY (EGD) WITH PROPOFOL;  Surgeon: Sherrilyn Rist, MD;  Location: [facility] ENDOSCOPY;  Service: Gastroenterology;  Laterality: N/A;   HERNIA REPAIR     PERCUTANEOUS CORONARY STENT INTERVENTION (PCI-S)  01/05/2009   POLYPECTOMY  03/06/2021   Procedure: POLYPECTOMY;  Surgeon: Benancio Deeds, MD;  Location: [facility];  Service: Gastroenterology;;     (Not in a hospital admission)   Inpatient Medications:   potassium chloride  40 mEq Oral Once    Allergies:  Allergies  Allergen Reactions   Lisinopril Shortness Of Breath    Makes lips swell   Allopurinol Nausea Only    dizziness   Apple Juice Swelling    Causes Gout to flare up    Entresto [Sacubitril-Valsartan]     History of angioedema   Other Swelling    Nuts - Lip Swelling   Shellfish Allergy Swelling    Lip Swelling    Family History  Problem Relation Age of Onset   Breast cancer Mother    Pancreatic cancer Mother    Hypertension Father    Alzheimer's disease Father    Gout Father    Dementia Father    Hypertension Sister    Clotting disorder Sister    Obesity Brother    Bronchitis Brother    Cancer Maternal Uncle        unknown type; dx after 77; x2 maternal uncles   Breast cancer Paternal Aunt        dx after 50   Lung cancer Paternal Uncle    Heart disease Maternal Grandmother    Gout Paternal Grandfather    Colon polyps Neg Hx    Colon cancer Neg Hx    Esophageal cancer Neg Hx    Stomach cancer Neg Hx      Physical Exam: Vitals:   06/08/22 1122 06/08/22 1123  BP: (!) 154/98    Pulse: 97   Resp: 17   Temp: 99.1 F (37.3 C)   TempSrc: Oral   SpO2: 98%   Weight:  68 kg  Height:  5\' 5"  (1.651 m)    GEN- NAD, A&O x 3, normal affect HEENT: Normocephalic, atraumatic Lungs- CTAB, Normal effort.  Heart- Regular rate and rhythm, No M/G/R.  GI- Soft, NT, ND.  Extremities- No clubbing, cyanosis, or edema   Radiology/Studies: No results found.  EKG:on arrival shows sinus tach at 100 bpm with significant baseline noise from BAT (personally reviewed)  TELEMETRY:  NSR sinus tach 90-100s (personally reviewed)  DEVICE HISTORY:  Barostim (standard) implanted 07/07/2019 for Chronic systolic CHF [employer] single chamber ICD 06/05/2013 for chronic systolic CHF  Assessment/Plan:  VT with ICD shock In setting of med non-compliance, ETOH abuse, and hypokalemia.  ? Worsening CHF as well.  Reload IV amiodarone.   Chronic systolic CHF s/p Barostim (standard) and [employer] single chamber ICD NYHA III chronically Non-compliant with coreg, losartan, Bidil, and spiro.  Not Entresto candidate (h/o angioedema)   Had been taking only his digoxin and lasix.  Will need to gradually add GDMT back as tolerated.  Does not appear markedly volume overload, and hypertensive on presentation; so suspect OK to resume BB.  Will use toprol instead of coreg to hopefully augment compliance.   Hypokalemia K 3.1 in setting of med non-compliance and ETOH abuse.    Atypical chest pain Chronic, intermittent and stable. No exertional type pains.  Myoview 05/2020 with fixed chronic, persistent defect. No ischemia.  Felt similar prior to ICD firing.    ETOH abuse Drinks most days, most of the day.  States he had 5 shots last night before bed.  Blood level 100 mg/dl this am (consistent with 0.10 BAC)  Depression Med Non-compliance States he was depressed and tired of taking his medications.  He denies any interest in hurting himself or any plans to do so.   We have requested medicine  admission in setting of ETOH abuse, heart failure, med non-compliance, and depression.   EP will follow along.   Have asked HF team to see tomorrow to assist in GDMT titration and continuity of his HF care.   For questions or updates, please contact [facility] Please consult [email or web address] for contact info under Cardiology/STEMI.  Dustin Flock, PA-C  06/08/2022 2:32 PM

## 2022-06-09 ENCOUNTER — Inpatient Hospital Stay (HOSPITAL_COMMUNITY): Payer: Medicare Other

## 2022-06-09 ENCOUNTER — Observation Stay (HOSPITAL_COMMUNITY): Payer: Medicare Other

## 2022-06-09 DIAGNOSIS — I2721 Secondary pulmonary arterial hypertension: Secondary | ICD-10-CM | POA: Diagnosis not present

## 2022-06-09 DIAGNOSIS — I5043 Acute on chronic combined systolic (congestive) and diastolic (congestive) heart failure: Secondary | ICD-10-CM

## 2022-06-09 DIAGNOSIS — G8929 Other chronic pain: Secondary | ICD-10-CM | POA: Diagnosis present

## 2022-06-09 DIAGNOSIS — K222 Esophageal obstruction: Secondary | ICD-10-CM | POA: Diagnosis present

## 2022-06-09 DIAGNOSIS — E1122 Type 2 diabetes mellitus with diabetic chronic kidney disease: Secondary | ICD-10-CM | POA: Diagnosis not present

## 2022-06-09 DIAGNOSIS — F10239 Alcohol dependence with withdrawal, unspecified: Secondary | ICD-10-CM | POA: Diagnosis not present

## 2022-06-09 DIAGNOSIS — D539 Nutritional anemia, unspecified: Secondary | ICD-10-CM | POA: Diagnosis not present

## 2022-06-09 DIAGNOSIS — R570 Cardiogenic shock: Secondary | ICD-10-CM | POA: Diagnosis not present

## 2022-06-09 DIAGNOSIS — E876 Hypokalemia: Secondary | ICD-10-CM | POA: Diagnosis present

## 2022-06-09 DIAGNOSIS — E873 Alkalosis: Secondary | ICD-10-CM | POA: Diagnosis not present

## 2022-06-09 DIAGNOSIS — N179 Acute kidney failure, unspecified: Secondary | ICD-10-CM | POA: Diagnosis present

## 2022-06-09 DIAGNOSIS — J9601 Acute respiratory failure with hypoxia: Secondary | ICD-10-CM | POA: Insufficient documentation

## 2022-06-09 DIAGNOSIS — J690 Pneumonitis due to inhalation of food and vomit: Secondary | ICD-10-CM | POA: Diagnosis not present

## 2022-06-09 DIAGNOSIS — I428 Other cardiomyopathies: Secondary | ICD-10-CM | POA: Diagnosis present

## 2022-06-09 DIAGNOSIS — I2510 Atherosclerotic heart disease of native coronary artery without angina pectoris: Secondary | ICD-10-CM | POA: Diagnosis present

## 2022-06-09 DIAGNOSIS — N182 Chronic kidney disease, stage 2 (mild): Secondary | ICD-10-CM | POA: Diagnosis present

## 2022-06-09 DIAGNOSIS — Y905 Blood alcohol level of 100-119 mg/100 ml: Secondary | ICD-10-CM | POA: Diagnosis present

## 2022-06-09 DIAGNOSIS — E785 Hyperlipidemia, unspecified: Secondary | ICD-10-CM | POA: Diagnosis present

## 2022-06-09 DIAGNOSIS — I5023 Acute on chronic systolic (congestive) heart failure: Secondary | ICD-10-CM | POA: Diagnosis not present

## 2022-06-09 DIAGNOSIS — I472 Ventricular tachycardia, unspecified: Secondary | ICD-10-CM

## 2022-06-09 DIAGNOSIS — R001 Bradycardia, unspecified: Secondary | ICD-10-CM | POA: Diagnosis not present

## 2022-06-09 DIAGNOSIS — F32A Depression, unspecified: Secondary | ICD-10-CM | POA: Diagnosis not present

## 2022-06-09 DIAGNOSIS — I5022 Chronic systolic (congestive) heart failure: Secondary | ICD-10-CM

## 2022-06-09 DIAGNOSIS — F10220 Alcohol dependence with intoxication, uncomplicated: Secondary | ICD-10-CM | POA: Diagnosis not present

## 2022-06-09 DIAGNOSIS — I130 Hypertensive heart and chronic kidney disease with heart failure and stage 1 through stage 4 chronic kidney disease, or unspecified chronic kidney disease: Secondary | ICD-10-CM | POA: Diagnosis not present

## 2022-06-09 DIAGNOSIS — E871 Hypo-osmolality and hyponatremia: Secondary | ICD-10-CM | POA: Diagnosis not present

## 2022-06-09 LAB — COMPREHENSIVE METABOLIC PANEL
ALT: 177 U/L — ABNORMAL HIGH (ref 0–44)
AST: 235 U/L — ABNORMAL HIGH (ref 15–41)
Albumin: 3.6 g/dL (ref 3.5–5.0)
Alkaline Phosphatase: 102 U/L (ref 38–126)
Anion gap: 14 (ref 5–15)
BUN: 11 mg/dL (ref 6–20)
CO2: 18 mmol/L — ABNORMAL LOW (ref 22–32)
Calcium: 8.8 mg/dL — ABNORMAL LOW (ref 8.9–10.3)
Chloride: 101 mmol/L (ref 98–111)
Creatinine, Ser: 1.13 mg/dL (ref 0.61–1.24)
GFR, Estimated: >60 mL/min (ref >60)
Glucose, Bld: 152 mg/dL — ABNORMAL HIGH (ref 70–99)
Potassium: 4.1 mmol/L (ref 3.5–5.1)
Sodium: 133 mmol/L — ABNORMAL LOW (ref 135–145)
Total Bilirubin: 2.1 mg/dL — ABNORMAL HIGH (ref 0.3–1.2)
Total Protein: 6.9 g/dL (ref 6.5–8.1)

## 2022-06-09 LAB — ECHOCARDIOGRAM COMPLETE
Area-P 1/2: 7.66 cm2
Calc EF: 12.3 %
Est EF: <20
Height: 65 in
MV M vel: 4.77 m/s
MV Peak grad: 91.1 mmHg
Radius: 0.4 cm
S' Lateral: 5.8 cm
Single Plane A2C EF: 7.5 %
Single Plane A4C EF: 20.7 %
Weight: 2224 oz

## 2022-06-09 LAB — CBC
HCT: 37.9 % — ABNORMAL LOW (ref 39.0–52.0)
Hemoglobin: 13 g/dL (ref 13.0–17.0)
MCH: 33.9 pg (ref 26.0–34.0)
MCHC: 34.3 g/dL (ref 30.0–36.0)
MCV: 98.7 fL (ref 80.0–100.0)
Platelets: 184 10*3/uL (ref 150–400)
RBC: 3.84 MIL/uL — ABNORMAL LOW (ref 4.22–5.81)
RDW: 12.1 % (ref 11.5–15.5)
WBC: 10.5 10*3/uL (ref 4.0–10.5)
nRBC: 0 % (ref 0.0–0.2)

## 2022-06-09 LAB — GLUCOSE, CAPILLARY: Glucose-Capillary: 171 mg/dL — ABNORMAL HIGH (ref 70–99)

## 2022-06-09 LAB — BLOOD GAS, VENOUS
Acid-base deficit: 2 mmol/L (ref 0.0–2.0)
Bicarbonate: 20.4 mmol/L (ref 20.0–28.0)
Drawn by: 7340
O2 Saturation: 100 %
Patient temperature: 36.8
pCO2, Ven: 28 mmHg — ABNORMAL LOW (ref 44–60)
pH, Ven: 7.47 — ABNORMAL HIGH (ref 7.25–7.43)
pO2, Ven: 184 mmHg — ABNORMAL HIGH (ref 32–45)

## 2022-06-09 LAB — PROCALCITONIN: Procalcitonin: 0.46 ng/mL

## 2022-06-09 LAB — PROTIME-INR
INR: 1 (ref 0.8–1.2)
Prothrombin Time: 13.3 seconds (ref 11.4–15.2)

## 2022-06-09 LAB — TROPONIN I (HIGH SENSITIVITY)
Troponin I (High Sensitivity): 72 ng/L — ABNORMAL HIGH (ref <18)
Troponin I (High Sensitivity): 79 ng/L — ABNORMAL HIGH (ref <18)

## 2022-06-09 LAB — MISC LABCORP TEST (SEND OUT): Labcorp test code: 83935

## 2022-06-09 LAB — MAGNESIUM: Magnesium: 1.8 mg/dL (ref 1.7–2.4)

## 2022-06-09 MED ORDER — SPIRONOLACTONE 12.5 MG HALF TABLET
12.5000 mg | ORAL_TABLET | Freq: Every day | ORAL | Status: DC
  Administered 2022-06-09: 12.5 mg via ORAL
  Filled 2022-06-09: qty 1

## 2022-06-09 MED ORDER — PERFLUTREN LIPID MICROSPHERE
1.0000 mL | INTRAVENOUS | Status: AC | PRN
  Administered 2022-06-09: 10 mL via INTRAVENOUS

## 2022-06-09 MED ORDER — FUROSEMIDE 10 MG/ML IJ SOLN
80.0000 mg | Freq: Once | INTRAMUSCULAR | Status: AC
  Administered 2022-06-09: 80 mg via INTRAVENOUS
  Filled 2022-06-09: qty 8

## 2022-06-09 MED ORDER — ALUM & MAG HYDROXIDE-SIMETH 200-200-20 MG/5ML PO SUSP
30.0000 mL | ORAL | Status: DC | PRN
  Administered 2022-06-09: 30 mL via ORAL
  Filled 2022-06-09: qty 30

## 2022-06-09 MED ORDER — FUROSEMIDE 10 MG/ML IJ SOLN
80.0000 mg | Freq: Three times a day (TID) | INTRAMUSCULAR | Status: DC
  Administered 2022-06-09 (×2): 80 mg via INTRAVENOUS
  Filled 2022-06-09 (×2): qty 8

## 2022-06-09 MED ORDER — ASPIRIN 81 MG PO CHEW
81.0000 mg | CHEWABLE_TABLET | ORAL | Status: AC
  Filled 2022-06-09: qty 1

## 2022-06-09 MED ORDER — MUPIROCIN 2 % EX OINT
1.0000 | TOPICAL_OINTMENT | Freq: Two times a day (BID) | CUTANEOUS | Status: DC
  Administered 2022-06-10 (×2): 1 via NASAL
  Filled 2022-06-09: qty 22

## 2022-06-09 MED ORDER — FUROSEMIDE 10 MG/ML IJ SOLN: 80.0000 mg | Freq: Two times a day (BID) | INTRAMUSCULAR | Status: DC

## 2022-06-09 MED ORDER — SODIUM CHLORIDE 0.9 % IV SOLN: INTRAVENOUS | Status: DC

## 2022-06-09 MED ORDER — SODIUM CHLORIDE 0.9% FLUSH
3.0000 mL | Freq: Two times a day (BID) | INTRAVENOUS | Status: DC
  Administered 2022-06-09 – 2022-06-15 (×8): 3 mL via INTRAVENOUS

## 2022-06-09 MED ORDER — MAGNESIUM SULFATE 2 GM/50ML IV SOLN
2.0000 g | Freq: Once | INTRAVENOUS | Status: AC
  Administered 2022-06-09: 2 g via INTRAVENOUS
  Filled 2022-06-09: qty 50

## 2022-06-09 MED ORDER — SODIUM CHLORIDE 0.9 % IV SOLN
250.0000 mL | INTRAVENOUS | Status: DC | PRN
  Administered 2022-06-10: 250 mL via INTRAVENOUS
  Administered 2022-06-10: 500 mL via INTRAVENOUS
  Administered 2022-06-10: 250 mL via INTRAVENOUS

## 2022-06-09 MED ORDER — LORAZEPAM 2 MG/ML IJ SOLN
4.0000 mg | Freq: Once | INTRAMUSCULAR | Status: AC
  Administered 2022-06-09: 4 mg via INTRAVENOUS
  Filled 2022-06-09: qty 2

## 2022-06-09 MED ORDER — ISOSORB DINITRATE-HYDRALAZINE 20-37.5 MG PO TABS
0.5000 | ORAL_TABLET | Freq: Three times a day (TID) | ORAL | Status: DC
  Administered 2022-06-09 (×2): 0.5 via ORAL
  Filled 2022-06-09: qty 1
  Filled 2022-06-09: qty 0.5
  Filled 2022-06-09: qty 1

## 2022-06-09 MED ORDER — SODIUM CHLORIDE 0.9% FLUSH: 3.0000 mL | INTRAVENOUS | Status: DC | PRN

## 2022-06-09 MED ORDER — DAPAGLIFLOZIN PROPANEDIOL 10 MG PO TABS
10.0000 mg | ORAL_TABLET | Freq: Every day | ORAL | Status: DC
  Administered 2022-06-09: 10 mg via ORAL
  Filled 2022-06-09 (×2): qty 1

## 2022-06-09 MED ORDER — POTASSIUM CHLORIDE 20 MEQ PO PACK
40.0000 meq | PACK | Freq: Two times a day (BID) | ORAL | Status: DC
  Administered 2022-06-09 (×2): 40 meq via ORAL
  Filled 2022-06-09 (×2): qty 2

## 2022-06-09 NOTE — Assessment & Plan Note (Signed)
Presented with chest pain in the setting of chronic NICM.  Elevated troponins in the setting of AICD discharge.  No current chest pain/shortness of breath.  No evidence of ischemia on EKG. -Continue to monitor -statin

## 2022-06-09 NOTE — Progress Notes (Addendum)
FMTS Interim Progress Note  S: Paged by RN patient had increase in oxygen requirement 7L HFNC up to 15 L HFNC and working to breathe. Patient does have some increase in left sided chest pain.  Went to see patient at bedside and he is dyspneic and able to speak but does have some pauses in speech.  Saturating at mid 80s on 15 L high flow.  Respiratory therapy at bedside and will switch to BiPAP right now. Does have some left sided chest pain ICD is, tender to palpation but feels like it has been worsening this morning.  He says that the shortness of breath happened after an "episode" where he was trying to eat a banana and ended up coughing and felt like it got stuck in his throat.  After this is when his oxygen saturations declined and shortness of breath happened.  O: BP (!) 154/106 (BP Location: Right Arm)   Pulse (!) 110   Temp 98.2 F (36.8 C) (Oral)   Resp (!) 21   Ht 5\' 5"  (1.651 m)   Wt 63 kg   SpO2 93%   BMI 23.13 kg/m    General: awake, alert, responsive to questions Head: Normocephalic atraumatic CV: Tachycardia present to 110s-120s in room, normal rhythm Respiratory: Rales throughout anterior lung fields, increased work of breathing on 15 L [place] Abdomen: Distended, mildly tender to palpation in left upper quadrant Extremities: Moves upper and lower extremities freely  A/P: Acute respiratory failure Differential includes aspiration given event prior to desaturations and increased oxygen requirement.  Patient does have HFrEF-consider decompensation of this. Flash pulmonary edema possible as well-significant rales throughout lungs. Awaiting echocardiogram.  Recent ICD discharges and chest pain-consider acute ischemic event, EKG performed at bedside similar to prior but will have EP/cards take a look troponins will be collected. CTPE possible as well given tachycardia and not on OAC. Will collect VBG. -Cardiology alerted, heart failure and cardiology will see this morning -EKG,  troponin, VBG, -BiPAP-will make patient n.p.o. given he has had emesis throughout night, mentating very well in room but monitor closely whether he will need escalation of care -Lasix 80 IV  -CXR -CTPE  Plan discussed with attendings Dr. Pollie Meyer and Dr. Dwana Melena, MD 06/09/2022, 7:17 AM PGY-2, [facility] pager [phone]

## 2022-06-09 NOTE — Assessment & Plan Note (Signed)
P/w abdominal distension and mild tenderness. Lipase slightly elevated, unlikely acute pancreatitis.RUQ U/S with cholelithiasis but no cholecystitis; possible steatosis. AST/ALT elevated likely 2/2 EtOH use. PT/INR wnl. Pt does have a hx of upper GI bleed, EGD in 5/23 showed benign-appearing esophageal stenosis, 2 cm hiatal hernia, nonbleeding gastric ulcers with no stigmata of bleeding.  -Consider abd U/S for ascites -home protonix -AM CMP

## 2022-06-09 NOTE — Assessment & Plan Note (Signed)
Pt on Paxil 20mg  qd at home, has not been taking recently. Reports heavy alcohol use in the setting of many environmental/familial stressors.  -Restart Paxil -Appreciate TOC

## 2022-06-09 NOTE — Progress Notes (Signed)
FMTS Interim Progress Note  S:Called and spoke with Dr. Marchelle Gearing with PCCM. Patient has been aggressive with staff and pulling out his lines. CIWA on last 2 checks likely 20+ both times. Extremely agitated. Respiratory status looks good on 20 L HFNC. Received 4 mg ativan IV and has calmed down some but likely will need infusion for possible withdrawal symptoms/agitation. Last drink 6/3 at 0200.   Discussed concern for electrolyte derangement 240 mg IV lasix today for flash pulmonary edema and no   Myself and nursing staff have called phlebotomy multiple times about STAT labs to be run-they are collecting right now.  He said NP will see patient shortly  Levin Erp, MD 06/09/2022, 11:58 PM PGY-2, [facility] pager [phone]

## 2022-06-09 NOTE — Assessment & Plan Note (Signed)
S/p AICD placement in 2015 for VT.  Presents with multiple reported AICD discharges.  Likely in the setting of medication noncompliance.  Also with EtOH use and hypokalemia.  He is hemodynamically stable and currently in sinus rhythm on monitor.  Elevated troponin in the setting of ICD discharge, no acute ischemic changes on EKG. -EP and heart failure team following, appreciate recommendations -On IV amiodarone infusion -K>4, Mg>2, replete as indicated

## 2022-06-09 NOTE — Progress Notes (Signed)
   Inpatient Rehab Admissions Coordinator :  Per therapy recommendations patient was screened for CIR candidacy by Malynda Smolinski RN MSN. Patient is not yet at a level to tolerate the intensity required to pursue a CIR admit . Patient may have the potential to progress to become a candidate. The CIR admissions team will follow and monitor for progress and place a Rehab Consult order if felt to be appropriate. Please contact me with any questions.  Abdishakur Gottschall RN MSN Admissions Coordinator [phone]  

## 2022-06-09 NOTE — Progress Notes (Signed)
Daily Progress Note Intern Pager: [phone]  Patient name: Stanley [name] Medical record number: [id number] Date of birth: [birth year] Age: 57 y.o. Gender: male  Primary Care Provider: [name], NP Consultants: EP, heart failure Code Status: Full  Pt Overview and Major Events to Date:  6/3 admitted 6/4 acute hypoxemic respiratory failure, requiring BiPAP  Assessment and Plan:  Stanley [name] is a 57 y.o. male presenting with AICD discharges, also with recent heavy EtOH use. Acute worsening of respiratory status on 6/4 AM with ongoing workup.   PMHx includes NICM, CHF, VT sp AICD placement, EtOH use disorder, anxiety/depression   Active Hospital Problems   *AICD discharge           S/p AICD placement in 2015 for VT.  Presents with           multiple reported AICD discharges.  Likely in the           setting of medication noncompliance.  Also with           EtOH use and hypokalemia.  He is hemodynamically           stable and currently in sinus rhythm on monitor.            Elevated troponin in the setting of ICD discharge,           no acute ischemic changes on EKG.-EP and heart           failure team following, appreciate recommendations           -On IV amiodarone infusion           -K>4, Mg>2, replete as indicated    Acute hypoxemic respiratory failure (HCC)           Acute respiratory failure overnight, requiring           BiPAP this morning. Possibly 2/2 aspiration vs           worsening CHF. Obtaining CTA to r/o PE given sinus           tachycardia as well. S/p IV Lasix 80mg  x1, appears           euvolemic overall this morning.           -F/u CTA           -F/u CXR           -F/u troponin           -Appreciate EP and HF               ETOH abuse           CIWA 3>12>14 overnight, pt with significant EtOH           use and at risk for            withdrawal during his stay.            -CIWA with Ativan           -Thiamine/folate            -Appreciate  TOC    Abdominal pain           P/w abdominal distension and mild tenderness.           Lipase slightly elevated, unlikely acute           pancreatitis.RUQ U/S with cholelithiasis but no           cholecystitis; possible steatosis. AST/ALT  elevated likely 2/2 EtOH use. PT/INR wnl. Pt does           have a hx of upper GI bleed, EGD in 5/23 showed           benign-appearing esophageal stenosis, 2 cm hiatal           hernia, nonbleeding            gastric ulcers with no stigmata of bleeding.            -Consider abd U/S for ascites           -home protonix           -AM CMP    Chronic systolic heart failure (HCC)           Recent medication noncompliance           -Appreciate cardiology           -Started Toprol, losartan. Slowly adding back GDMT           per cardiology           -F/u echo    Nonischemic cardiomyopathy [facility])           Presented with chest pain in the setting of           chronic NICM.  Elevated troponins in the setting           of AICD discharge.  No current chest           pain/shortness of breath.  No evidence of ischemia           on EKG.           -Continue to monitor           -statin    Anxiety and depression           Pt on Paxil 20mg  qd at home, has not been taking           recently. Reports heavy alcohol use in the setting           of many environmental/familial stressors.            -Restart Paxil           -Appreciate [facility]   Resolved Hospital Problems No resolved problems to display.    FEN/GI: NPO on BIPAP PPx: heparin Dispo:Pending PT recommendations  pending clinical improvement . Barriers include management as above.   Subjective:  See Dr. Karen Kitchens note for overnight events. Briefly, had acute respiratory failure requiring bipap. Patient reports he may have choked on something while eating. This morning he is speaking through bipap and feels fine overall. Denies current chest pain. States his abdomen feels  about the same  Objective: Temp:  [97.8 F (36.6 C)-99.5 F (37.5 C)] 97.8 F (36.6 C) (06/04 0922) Pulse Rate:  [92-121] 114 (06/04 0922) Resp:  [17-34] 30 (06/04 0922) BP: (151-167)/(92-112) 151/107 (06/04 0922) SpO2:  [88 %-99 %] 99 % (06/04 0922) FiO2 (%):  [100 %] 100 % (06/04 0718) Weight:  [16 kg-68 kg] 63 kg (06/04 0438) Physical Exam: General: NAD, on bipap Cardiovascular: sinus tachy on monitor Respiratory: slightly diminished on right > left. On bipap. Speaking in full sentences Abdomen: mild distension, discomfort to palpation throughout Extremities: no significant edema  Laboratory: Most recent CBC Lab Results  Component Value Date   WBC 10.5 06/09/2022   HGB 13.0 06/09/2022   HCT 37.9 (L) 06/09/2022   MCV 98.7 06/09/2022  PLT 184 06/09/2022   Most recent BMP    Latest Ref Rng & Units 06/09/2022    1:28 AM  BMP  Glucose 70 - 99 mg/dL 161   BUN 6 - 20 mg/dL 11   Creatinine 0.96 - 1.24 mg/dL 0.45   Sodium 409 - 811 mmol/L 133   Potassium 3.5 - 5.1 mmol/L 4.1   Chloride 98 - 111 mmol/L 101   CO2 22 - 32 mmol/L 18   Calcium 8.9 - 10.3 mg/dL 8.8    AST/ALT 914/782 Tbili 2.1  PT/INR 13.3/1.0  Imaging/Diagnostic Tests:  RUQ US IMPRESSION: 1. Cholelithiasis without sonographic evidence of acute cholecystitis. 2. Diffusely increased hepatic echogenicity can be seen in the setting of infiltrative process such as steatosis.  Vonna Drafts, MD 06/09/2022, 9:29 AM  PGY-1, [facility] FPTS Intern pager: [phone], text pages welcome Secure chat group [facility] Teaching Service

## 2022-06-09 NOTE — Assessment & Plan Note (Signed)
CIWA 3>12>14 overnight, pt with significant EtOH use and at risk for withdrawal during his stay.  -CIWA with Ativan -Thiamine/folate -Appreciate  TOC

## 2022-06-09 NOTE — Assessment & Plan Note (Addendum)
Acute respiratory failure overnight, requiring BiPAP this morning. Possibly 2/2 aspiration vs worsening CHF. Obtaining CTA to r/o PE given sinus tachycardia as well. S/p IV Lasix 80mg  x1, appears euvolemic overall this morning. -F/u CTA -F/u CXR -F/u troponin -Appreciate EP and HF

## 2022-06-09 NOTE — Progress Notes (Signed)
Called to patients room by nurse due to patient having low Sp02 levels. Patient wearing oxygen set at 6lpm with Sp02=82%. Changed patients oxygen device to high flow salter set at 14LPM, patients Sp02 level increased to 86-88%. Changed patient to 100% NRB with Sp02 increasing to 88%. Placed patient on bipap, doctor at bedside.

## 2022-06-09 NOTE — Progress Notes (Signed)
RT note. Patient placed on heated high flow per Md request. Patient currently on 40L - 70% sat 93% with labored breathing noted. RT will continue to monitor, v60 at bedside if needed later.   06/09/22 1122  Therapy Vitals  Pulse Rate (!) 111  Resp 20  MEWS Score/Color  MEWS Score 2  MEWS Score Color Yellow  Respiratory Assessment  Assessment Type Assess only  Respiratory Pattern Regular;Labored  Chest Assessment Chest expansion symmetrical  Bilateral Breath Sounds Rhonchi;Diminished  Oxygen Therapy/Pulse Ox  O2 Device (S)  HFNC  O2 Therapy Oxygen humidified  Heater temperature 93.2 F (34 C)  O2 Flow Rate (L/min) 40 L/min  FiO2 (%) 70 %  SpO2 93 %

## 2022-06-09 NOTE — Progress Notes (Signed)
RT note. RT called to patient bedside due to desat and HHFNC ringing out leak. Leak fixed patient placed back on HHFNC sat 93% no labored breathing. RT will continue to monitor.    06/09/22 1707  Therapy Vitals  Pulse Rate (!) 121  Resp 18  MEWS Score/Color  MEWS Score 2  MEWS Score Color Yellow  Respiratory Assessment  Assessment Type Assess only  Respiratory Pattern Regular;Unlabored  Chest Assessment Chest expansion symmetrical  Oxygen Therapy/Pulse Ox  O2 Device HFNC  O2 Therapy Oxygen humidified  Heater temperature 93.2 F (34 C)  O2 Flow Rate (L/min) 40 L/min  FiO2 (%) 60 %  SpO2 93 %

## 2022-06-09 NOTE — Consult Note (Signed)
NAME:  Stanley [name], MRN:  [id number], DOB:  12-26-1965, LOS: 0 ADMISSION DATE:  06/08/2022, CONSULTATION DATE:  06/09/22 REFERRING MD:  Stanley [name] CHIEF COMPLAINT:  Dyspnea   History of Present Illness:  Stanley [name] is a 57 y.o. male who has a PMH as below. He was admitted 6/3 after ICD discharged multiple times at his home. He has both ICD and Barostim devices in place. He was seen by EP and his ICD discharges were found to be 2/2 VT presumed in the setting of medication non-compliance, EtOH intoxication and associated electrolyte derangements.  He was started on Amiodarone and was admitted for further workup. He is being seen by EP as well as AHF.  AM 6/4, he apparently choked on a banana and shortly thereafter, had an episode of desaturation and possibly increased WOB. His O2 via [place] was turned up but did not improve his hypoxia; therefore, he was placed on BiPAP. CXR was obtained and revealed pulmonary edema R > L for which he received Lasix. There is also some concern for possible aspiration.  PCCM called in consultation AM 6/4 for further recs. At the time of our evaluation, pt was on BiPAP and asking why he was still on it. ABG from earlier this morning showed respiratory alkalosis (7.47/28/184).  He tells [place] that he has chronic dysphagia to both solids and liquids and that he has been evaluated by GI in the past along with abdominal pain and bloating. Review of his chart shows that he saw GI in 2022  and had EGD 03/06/21 that demonstrated a 1cm hiatal hernia and he had empiric esophageal dilation using a savary dilator with mild resistance at 17mm. He was started on Zofran and his Protonix was increased from QD to BID dosing. 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 There was no reason to explain his bleeding.   Pertinent  Medical History:  has Nonischemic cardiomyopathy (HCC); Chronic systolic  dysfunction of left ventricle; Essential hypertension; Gout; Implantable cardioverter-defibrillator (ICD) in situ; ETOH abuse; AKI (acute kidney injury) (HCC); History of noncompliance with medical treatment, presenting hazards to health; Ventricular tachycardia (HCC); Obstructive sleep apnea; Chronic systolic heart failure (HCC); Moderate episode of recurrent major depressive disorder (HCC); CAD (coronary artery disease); Congestive heart failure (CHF) (HCC); Gastroesophageal reflux disease; Colon cancer screening; Benign neoplasm of colon; Abdominal pain, epigastric; Early satiety; Dysphagia; Hematemesis; ABLA (acute blood loss anemia); Diverticulosis of colon without hemorrhage; Family history of breast cancer; Family history of pancreatic cancer; Genetic testing; AICD discharge; Abdominal pain; Anxiety and depression; and Acute hypoxemic respiratory failure (HCC) on their problem list.  Significant Hospital Events: Including procedures, antibiotic start and stop dates in addition to other pertinent events   6/3 admit 6/4 PCCM consult  Interim History / Subjective:  BiPAP removed, placed on [place] and have asked RT to start HHFNC. He is answering questions appropriately. Says his breathing feels ok. Has ongoing abdominal discomfort but says this is chronic. Admits to chronic dysphagia and admits to choking on a piece of banana earlier. He does not feel that he aspirated and says he was able to cough it up pretty easily.  Objective:  Blood pressure (!) 133/97, pulse (!) 111, temperature 98.8 F (37.1 C), resp. rate 20, height 5\' 5"  (1.651 m), weight 63 kg, SpO2 93 %.    FiO2 (%):  [70 %-100 %] 70 %   Intake/Output Summary (Last 24  hours) at 06/09/2022 1124 Last data filed at 06/09/2022 1032 Gross per 24 hour  Intake 450 ml  Output 1700 ml  Net -1250 ml   Filed Weights   06/08/22 1123 06/08/22 1759 06/09/22 0438  Weight: 68 kg 66.5 kg 63 kg    Examination: General: Adult male, resting in bed,  in NAD. Neuro: A&O x 3, no deficits. HEENT: [place]/AT. Sclerae anicteric. EOMI. Cardiovascular: RRR, no M/R/G.  Lungs: Respirations even and unlabored.  Coarse bilaterally R > L. Abdomen: BS x 4, soft, NT/ND.  Musculoskeletal: No gross deformities, no edema.  Skin: Intact, warm, no rashes.  Labs/imaging personally reviewed:  CXR 6/4 > bilateral opacities R > L, edema vs aspiration  Assessment & Plan:   Acute hypoxic respiratory failure - presumed after choking event with possible aspiration; though, likely also component of flash pulmonary edema for which he has received 80mg  Lasix. - Continue supplemental O2 as needed to maintain SpO2 > 90%, we have started HHFNC. - BiPAP PRN only for increased WOB. - Defer abx for now unless becomes febrile or has worsening leukocytosis/infections symptoms. - Repeat CXR in AM. - Hold off on CTA chest for now as we have reasonable other explanations of his acute hypoxic episode.  Chronic dysphagia with chronic abdominal pain/bloating. - Would recommend engaging GI again and consider repeat EGD to evaluate for further/worsened esophageal strictures/achalasia/etc as well as re-evaluate his known hiatal hernia. - Consider empiric Reglan to assist with motility though caution with QTc given his VT.  AICD discharges - in setting of VT felt to be 2/2 EtOH and electrolyte derangements. - Per EP.  Chronic sCHF, NICM. - Per AHF.  EtOH dependence/abuse. - Cessation counseling. - Thiamine/Folate/MV.  Rest per primary team.  Best practice (evaluated daily):  Per primary.  Labs   CBC: Recent Labs  Lab 06/08/22 1132 06/08/22 1206 06/09/22 0128  WBC 4.4  --  10.5  NEUTROABS 3.2  --   --   HGB 11.9* 11.9* 13.0  HCT 35.2* 35.0* 37.9*  MCV 105.4*  --  98.7  PLT 156  --  184    Basic Metabolic Panel: Recent Labs  Lab 06/08/22 1132 06/08/22 1206 06/08/22 1410 06/09/22 0128  NA 140 142  --  133*  K 3.1* 3.1*  --  4.1  CL 106 111  --  101  CO2  15*  --   --  18*  GLUCOSE 54* 50*  --  152*  BUN 15 14  --  11  CREATININE 1.12 1.00  --  1.13  CALCIUM 8.9  --   --  8.8*  MG  --   --  1.2* 1.8   GFR: Estimated Creatinine Clearance: 62 mL/min (by C-G formula based on SCr of 1.13 mg/dL). Recent Labs  Lab 06/08/22 1132 06/09/22 0128  WBC 4.4 10.5    Liver Function Tests: Recent Labs  Lab 06/08/22 1326 06/09/22 0128  AST 312* 235*  ALT 174* 177*  ALKPHOS 72 102  BILITOT 1.6* 2.1*  PROT 6.1* 6.9  ALBUMIN 3.3* 3.6   Recent Labs  Lab 06/08/22 1326  LIPASE 60*   No results for input(s): "AMMONIA" in the last 168 hours.  ABG    Component Value Date/Time   HCO3 20.4 06/09/2022 0750   TCO2 15 (L) 06/08/2022 1206   ACIDBASEDEF 2.0 06/09/2022 0750   O2SAT 100 06/09/2022 0750     Coagulation Profile: Recent Labs  Lab 06/09/22 0128  INR 1.0    Cardiac  Enzymes: No results for input(s): "CKTOTAL", "CKMB", "CKMBINDEX", "TROPONINI" in the last 168 hours.  HbA1C: No results found for: "HGBA1C"  CBG: Recent Labs  Lab 06/08/22 1304 06/08/22 1353  GLUCAP 52* 84    Review of Systems:   All negative; except for those that are bolded, which indicate positives.  Constitutional: weight loss, weight gain, night sweats, fevers, chills, fatigue, weakness.  HEENT: headaches, sore throat, sneezing, nasal congestion, post nasal drip, difficulty swallowing, tooth/dental problems, visual complaints, visual changes, ear aches. Neuro: difficulty with speech, weakness, numbness, ataxia. CV:  chest pain, orthopnea, PND, swelling in lower extremities, dizziness, palpitations, syncope.  Resp: cough, hemoptysis, dyspnea, wheezing. GI: heartburn, indigestion, dysphagia, abdominal pain, nausea, vomiting, diarrhea, constipation, change in bowel habits, loss of appetite, hematemesis, melena, hematochezia.  GU: dysuria, change in color of urine, urgency or frequency, flank pain, hematuria. MSK: joint pain or swelling, decreased range  of motion. Psych: change in mood or affect, depression, anxiety, suicidal ideations, homicidal ideations. Skin: rash, itching, bruising.   Past Medical History:  He,  has a past medical history of Acute renal failure (ARF) (HCC), Acute respiratory failure with hypoxia (HCC) (12/29/2018), Alcohol abuse, Anxiety, CAD (coronary artery disease), Chronic chest pain, Chronic systolic CHF (congestive heart failure) (HCC), Depression, Dyspnea, Family history of breast cancer (05/30/2021), Family history of pancreatic cancer (05/30/2021), Financial difficulties, Gout, Gouty arthritis, Headache, History of noncompliance with medical treatment, Hypertension, ICD (implantable cardioverter-defibrillator) in place, Insomnia, Lobar pneumonia (HCC) (12/28/2018), Myocardial infarction (HCC) (2005), Nonischemic cardiomyopathy (HCC), Sleep apnea, and Ventricular tachycardia (HCC) (01/2015).   Surgical History:   Past Surgical History:  Procedure Laterality Date   BAROREFLEX SYSTEM INSERTION     BIOPSY  03/06/2021   Procedure: BIOPSY;  Surgeon: Benancio Deeds, MD;  Location: [facility];  Service: Gastroenterology;;   CARDIAC DEFIBRILLATOR PLACEMENT  06/05/2013   [employer] Inogen ICD implanted at [facility] in [place] for primary prevention   COLONOSCOPY WITH PROPOFOL N/A 03/06/2021   Procedure: COLONOSCOPY WITH PROPOFOL;  Surgeon: Benancio Deeds, MD;  Location: [facility];  Service: Gastroenterology;  Laterality: N/A;   ESOPHAGEAL DILATION  03/06/2021   Procedure: ESOPHAGEAL DILATION;  Surgeon: Benancio Deeds, MD;  Location: [facility];  Service: Gastroenterology;;   ESOPHAGOGASTRODUODENOSCOPY (EGD) WITH PROPOFOL N/A 03/06/2021   Procedure: ESOPHAGOGASTRODUODENOSCOPY (EGD) WITH PROPOFOL;  Surgeon: Benancio Deeds, MD;  Location: [facility];  Service: Gastroenterology;  Laterality: N/A;   ESOPHAGOGASTRODUODENOSCOPY (EGD) WITH PROPOFOL N/A 05/18/2021   Procedure:  ESOPHAGOGASTRODUODENOSCOPY (EGD) WITH PROPOFOL;  Surgeon: Sherrilyn Rist, MD;  Location: [facility] ENDOSCOPY;  Service: Gastroenterology;  Laterality: N/A;   HERNIA REPAIR     PERCUTANEOUS CORONARY STENT INTERVENTION (PCI-S)  01/05/2009   POLYPECTOMY  03/06/2021   Procedure: POLYPECTOMY;  Surgeon: Benancio Deeds, MD;  Location: [facility];  Service: Gastroenterology;;     Social History:   reports that he has never smoked. He has never used smokeless tobacco. He reports that he does not currently use alcohol. He reports that he does not use drugs.   Family History:  His family history includes Alzheimer's disease in his father; Breast cancer in his mother and paternal aunt; Bronchitis in his brother; Cancer in his maternal uncle; Clotting disorder in his sister; Dementia in his father; Gout in his father and paternal grandfather; Heart disease in his maternal grandmother; Hypertension in his father and sister; Lung cancer in his paternal uncle; Obesity in his brother; Pancreatic cancer in his mother. There is  no history of Colon polyps, Colon cancer, Esophageal cancer, or Stomach cancer.   Allergies Allergies  Allergen Reactions   Lisinopril Shortness Of Breath    Makes lips swell   Allopurinol Nausea Only    dizziness   Entresto [Sacubitril-Valsartan]     History of angioedema   Other Swelling    Nuts - Lip Swelling   Shellfish Allergy Swelling    Lip Swelling     Home Medications  Prior to Admission medications   Medication Sig Start Date End Date Taking? Authorizing Provider  carvedilol (COREG) 25 MG tablet TAKE 1 TABLET(25 MG) BY MOUTH TWICE DAILY WITH A MEAL Patient taking differently: Take 25 mg by mouth 2 (two) times daily with a meal. 05/23/21  Yes [name], NP  digoxin (LANOXIN) 0.125 MG tablet TAKE 1/2 TABLET EVERY DAY (DOSE CHANGE) Patient taking differently: Take 0.0625 mg by mouth daily. 02/26/22  Yes Bensimhon, Bevelyn Buckles, MD  furosemide (LASIX) 80 MG tablet  TAKE 1 TABLET BY MOUTH ON MONDAY, WEDNESDAY& FRIDAY. MAY TAKE 1 TABLET AS NEEDED FOR SWELLING 02/17/22  Yes Tillery, Mariam Dollar, PA-C  potassium chloride SA (KLOR-CON) 20 MEQ tablet Take 3 tablets (60 mEq total) by mouth daily. 11/05/20  Yes Graciella Freer, PA-C  sodium chloride (OCEAN) 0.65 % SOLN nasal spray Place 1 spray into both nostrils daily as needed for congestion.   Yes [provider]  traZODone (DESYREL) 150 MG tablet Take 1 tablet (150 mg total) by mouth at bedtime. 02/10/22  Yes [name], NP  allopurinol (ZYLOPRIM) 300 MG tablet Take 1 tablet (300 mg total) by mouth daily. 07/11/21   [name], NP  amiodarone (PACERONE) 200 MG tablet Take 1 tablet (200 mg total) by mouth daily. 05/17/21   Gailen Shelter, PA  nitroGLYCERIN (NITROSTAT) 0.4 MG SL tablet Place 1 tablet (0.4 mg total) under the tongue every 5 (five) minutes x 3 doses as needed for chest pain. 03/22/18   Sheilah Pigeon, PA-C     Rutherford Guys, [place] - [facility] For pager details, please see AMION or use Epic chat  After 1900, please call [facility] for cross coverage needs 06/09/2022, 11:24 AM

## 2022-06-09 NOTE — Progress Notes (Signed)
FMTS Brief Progress Note  S:Went to see patient again with Dr. Phineas Real after noticing increase in O2 requirement. Patient says he feels his breathing is okay until he has to move around a lot which causes him to feel short of breath. He has felt this way for years at home. His coughing is better. Denies any chest pain-lidocaine patch has been helping a lot. Still has some nausea but overall doing okay. Has been told he has severe OSA and is supposed to be on CPAP but could not get machine.  O: BP (!) 163/105 (BP Location: Right Arm)   Pulse (!) 117   Temp 98.1 F (36.7 C) (Oral)   Resp (!) 21   Ht 5\' 5"  (1.651 m)   Wt 66.5 kg   SpO2 94%   BMI 24.40 kg/m   Gen: NAD, awake, alert, responsive to all questions Resp: speaking full sentences, breathing comfortably on 5 L [place]-sats around 94  A/P: Increase in O2 requirement Likely 2/2 to OSA. Needs an investigation in AM about O2 with ambulation. No fevers to elude infectious etiology right now. No chest pain currently, downtrending trops. Will continue to monitor.  Levin Erp, MD 06/09/2022, 2:04 AM PGY-2, [facility] Night Resident  Please page [phone] with questions.

## 2022-06-09 NOTE — Evaluation (Signed)
Occupational Therapy Evaluation Patient Details Name: Stanley [name] MRN: [id number] DOB: 1965/02/16 Today's Date: 06/09/2022   History of Present Illness 57 yo male with ETOH induced nonischemic cardiomyopathy had ICD defibrillate x 3. Pt reported getting choked while eating a banana and developed respiratory distress. CXR showed Rt sides opacities. Pt tried on Bipap temporarily but had trouble tolerating. Pt admits to noncompliance with medications at home, as well as ongoing heavy alcohol use.   Clinical Impression   Pt presents with decline in function and safety with ADLs and ADL mobility with impaired balance, strength and endurance. Eval limited due to c/o dizziness standing at EOB HR (110 at rest to122 standing EOB) and BP (140/108); pt's nurse notified of VSs, mobility level and limited functional assessment. PTA pt reports that he lives alone in an independent senior living condo with level entry, walk in shower with seat and that he was Ind with ADLs, cooking, drives and uses a quad cane for mobility and that he furniture surfs sometimes. Pt currently requires Sup to sit EOB, min A to stand from EOB with verbal cues for safety. ADL assessment limited due to VSs and dizziness. Pt would benefit from acute OT services to address impairments to maximize level of function and safety   Recommendations for follow up therapy are one component of a multi-disciplinary discharge planning process, led by the attending physician.  Recommendations may be updated based on patient status, additional functional criteria and insurance authorization.   Assistance Recommended at Discharge Frequent or constant Supervision/Assistance  Patient can return home with the following A little help with bathing/dressing/bathroom;A little help with walking and/or transfers;Assistance with cooking/housework;Assist for transportation;Help with stairs or ramp for entrance    Functional Status Assessment  Patient has  had a recent decline in their functional status and demonstrates the ability to make significant improvements in function in a reasonable and predictable amount of time.  Equipment Recommendations       Recommendations for Other Services       Precautions / Restrictions Precautions Precautions: Fall;Other (comment) Precaution Comments: HFNC Restrictions Weight Bearing Restrictions: No      Mobility Bed Mobility Overal bed mobility: Needs Assistance Bed Mobility: Supine to Sit, Sit to Supine     Supine to sit: Supervision Sit to supine: Supervision   General bed mobility comments: no physical assist required, reports some dizziness seated EOB    Transfers Overall transfer level: Needs assistance Equipment used: 1 person hand held assist Transfers: Sit to/from Stand Sit to Stand: Min assist           General transfer comment: pt with elevating HR (110 at rest to122 standing EOB) and BP (140/108) with c/o dizziness standing at EOB      Balance                                           ADL either performed or assessed with clinical judgement   ADL Overall ADL's : Needs assistance/impaired     Grooming: Wash/dry hands;Wash/dry face;Set up;Supervision/safety;Sitting   Upper Body Bathing: Min guard;Sitting Upper Body Bathing Details (indicate cue type and reason): simulated     Upper Body Dressing : Min guard;Sitting           Toileting- Clothing Manipulation and Hygiene: Minimal assistance;Sit to/from stand Toileting - Clothing Manipulation Details (indicate cue type and reason): at EOB  Functional mobility during ADLs: Minimal assistance (sit - stand HHA at EOB) General ADL Comments: ADL assesment limited due to pt c/o dizziness and VSs     Vision Baseline Vision/History: 1 Wears glasses Ability to See in Adequate Light: 0 Adequate Patient Visual Report: No change from baseline       Perception     Praxis      Pertinent  Vitals/Pain Pain Assessment Pain Assessment: No/denies pain     Hand Dominance Right   Extremity/Trunk Assessment Upper Extremity Assessment Upper Extremity Assessment: Overall WFL for tasks assessed   Lower Extremity Assessment Lower Extremity Assessment: Defer to PT evaluation       Communication Communication Communication: No difficulties   Cognition Arousal/Alertness: Awake/alert Behavior During Therapy: Impulsive Overall Cognitive Status: No family/caregiver present to determine baseline cognitive functioning Area of Impairment: Safety/judgement, Problem solving, Awareness                         Safety/Judgement: Decreased awareness of safety   Problem Solving: Requires verbal cues       General Comments       Exercises     Shoulder Instructions      Home Living Family/patient expects to be discharged to:: Private residence Living Arrangements: Alone Available Help at Discharge: Family;Available PRN/intermittently Type of Home: Independent living facility Home Access: Level entry     Home Layout: One level     Bathroom Shower/Tub: Producer, television/film/video: Standard Bathroom Accessibility: No   Home Equipment: Cane - quad;Wheelchair - manual;Grab bars - tub/shower;Shower seat;Grab bars - toilet          Prior Functioning/Environment Prior Level of Function : Independent/Modified Independent;History of Falls (last six months);Driving             Mobility Comments: Pt reports Mod I furniture walking ADLs Comments: Ind with ADLs, IADLs, cooks, drives per pt report. Pt reports poor appetite at home        OT Problem List: Decreased activity tolerance;Impaired balance (sitting and/or standing);Decreased coordination;Decreased safety awareness;Decreased cognition;Decreased knowledge of use of DME or AE      OT Treatment/Interventions: Self-care/ADL training;DME and/or AE instruction;Therapeutic activities;Patient/family  education    OT Goals(Current goals can be found in the care plan section) Acute Rehab OT Goals Patient Stated Goal: go home OT Goal Formulation: With patient Time For Goal Achievement: 06/23/22 Potential to Achieve Goals: Good ADL Goals Pt Will Perform Grooming: with min guard assist;with supervision;standing Pt Will Perform Upper Body Bathing: with supervision;with set-up;sitting Pt Will Perform Lower Body Bathing: with min assist;with min guard assist;sit to/from stand Pt Will Perform Upper Body Dressing: with supervision;with set-up;sitting Pt Will Perform Lower Body Dressing: with min assist;with min guard assist;sit to/from stand Pt Will Transfer to Toilet: with min assist;with min guard assist;ambulating;bedside commode;regular height toilet;grab bars Pt Will Perform Toileting - Clothing Manipulation and hygiene: with min guard assist;with supervision;sit to/from stand  OT Frequency: Min 1X/week    Co-evaluation PT/OT/SLP Co-Evaluation/Treatment: Yes Reason for Co-Treatment: To address functional/ADL transfers;For patient/therapist safety   OT goals addressed during session: ADL's and self-care      AM-PAC OT "6 Clicks" Daily Activity     Outcome Measure Help from another person eating meals?: None Help from another person taking care of personal grooming?: A Little Help from another person toileting, which includes using toliet, bedpan, or urinal?: A Little Help from another person bathing (including washing, rinsing, drying)?: A Lot Help  from another person to put on and taking off regular upper body clothing?: A Little Help from another person to put on and taking off regular lower body clothing?: A Lot 6 Click Score: 17   End of Session Equipment Utilized During Treatment: Gait belt Nurse Communication: Mobility status  Activity Tolerance: Patient limited by fatigue;Other (comment) (dizziness, VSs) Patient left: in bed;with call bell/phone within reach;with bed alarm  set  OT Visit Diagnosis: Unsteadiness on feet (R26.81);Other abnormalities of gait and mobility (R26.89);Muscle weakness (generalized) (M62.81)                Time: 7829-5621 OT Time Calculation (min): 23 min Charges:  OT General Charges $OT Visit: 1 Visit OT Evaluation $OT Eval Moderate Complexity: 1 Mod    Galen Manila 06/09/2022, 2:08 PM

## 2022-06-09 NOTE — Progress Notes (Signed)
cALL FRO fpts  Admitted fo AICD discharge Now agitated Possibly aspirated On 20L [place]  Acute resp faiure Acute encephalopathy S/p lasix 240mg  today  Plan  - FPTS reqwuesting eval for precedex or phenobarb     SIGNATURE    Dr. Kalman Shan, M.D., F.C.C.P,  Pulmonary and Critical Care Medicine Staff Physician, [facility] Director - Interstitial Lung Disease  Program  Pulmonary Fibrosis [facility] [place], [place], [address]   Pager: [phone], If no answer  -> Check AMION or Try [phone] Telephone (clinical office): [phone] Telephone (research): [phone]  11:56 PM 06/09/2022

## 2022-06-09 NOTE — Evaluation (Signed)
Physical Therapy Evaluation Patient Details Name: Stanley [name] MRN: [id number] DOB: 02-13-1965 Today's Date: 06/09/2022  History of Present Illness  57 yo male with ETOH induced nonischemic cardiomyopathy had ICD defibrillate x 3. Pt reported getting choked while eating a banana and developed respiratory distress. CXR showed Rt sides opacities. Pt tried on Bipap temporarily but had trouble tolerating. Pt admits to noncompliance with medications at home, as well as ongoing heavy alcohol use.  Clinical Impression  Pt presents with admitting diagnosis above. Eval limited due to c/o dizziness standing at EOB HR (110 at rest to122 standing EOB) and BP (140/108). RN made aware. Pt reports at baseline he lives alone in an ILF where he was Mod I furniture walking with a history of falls at home. Today pt was able to stand with Min A however gait was deferred due to dizziness and vitals. Pt may benefit from CIR upon DC to return to functional independence as pt is highly motivated could likely tolerate 3 hours of therapy a day once medically stable. PT will continue to follow.     Recommendations for follow up therapy are one component of a multi-disciplinary discharge planning process, led by the attending physician.  Recommendations may be updated based on patient status, additional functional criteria and insurance authorization.  Follow Up Recommendations       Assistance Recommended at Discharge Frequent or constant Supervision/Assistance  Patient can return home with the following  A little help with walking and/or transfers;A little help with bathing/dressing/bathroom;Assistance with cooking/housework;Direct supervision/assist for medications management;Assist for transportation;Help with stairs or ramp for entrance    Equipment Recommendations Other (comment) (Per accepting facility)  Recommendations for Other Services  Rehab consult    Functional Status Assessment Patient has had a  recent decline in their functional status and demonstrates the ability to make significant improvements in function in a reasonable and predictable amount of time.     Precautions / Restrictions Precautions Precautions: Fall;Other (comment) Precaution Comments: HFNC Restrictions Weight Bearing Restrictions: No      Mobility  Bed Mobility Overal bed mobility: Needs Assistance Bed Mobility: Supine to Sit, Sit to Supine     Supine to sit: Supervision Sit to supine: Supervision   General bed mobility comments: no physical assist required, reports some dizziness seated EOB    Transfers Overall transfer level: Needs assistance Equipment used: 1 person hand held assist Transfers: Sit to/from Stand Sit to Stand: Min assist           General transfer comment: pt with elevating HR (122) and BP (140/108) with c/o dizziness standing at EOB    Ambulation/Gait                  Stairs            Wheelchair Mobility    Modified Rankin (Stroke Patients Only)       Balance Overall balance assessment: Mild deficits observed, not formally tested                                           Pertinent Vitals/Pain Pain Assessment Pain Assessment: No/denies pain    Home Living Family/patient expects to be discharged to:: Private residence Living Arrangements: Alone Available Help at Discharge: Family;Available PRN/intermittently Type of Home: Independent living facility Home Access: Level entry       Home Layout: One level Home Equipment: Gilmer Mor -  quad;Wheelchair - manual;Grab bars - tub/shower;Shower seat;Grab bars - toilet      Prior Function Prior Level of Function : Independent/Modified Independent;History of Falls (last six months);Driving             Mobility Comments: Pt reports Mod I furniture walking ADLs Comments: Ind with ADLs, IADLs, cooks, drives per pt report     Hand Dominance   Dominant Hand: Right     Extremity/Trunk Assessment   Upper Extremity Assessment Upper Extremity Assessment: Overall WFL for tasks assessed    Lower Extremity Assessment Lower Extremity Assessment: Overall WFL for tasks assessed    Cervical / Trunk Assessment Cervical / Trunk Assessment: Normal  Communication   Communication: No difficulties  Cognition Arousal/Alertness: Awake/alert Behavior During Therapy: Impulsive Overall Cognitive Status: No family/caregiver present to determine baseline cognitive functioning Area of Impairment: Safety/judgement, Problem solving, Awareness                         Safety/Judgement: Decreased awareness of safety   Problem Solving: Requires verbal cues          General Comments General comments (skin integrity, edema, etc.): Pt on 25L of Heated HFNC satting between 88%-94% with mobility. BP: 134/101 standing    Exercises     Assessment/Plan    PT Assessment Patient needs continued PT services  PT Problem List Decreased strength;Decreased activity tolerance;Decreased balance;Decreased mobility;Decreased coordination;Decreased cognition;Decreased knowledge of use of DME;Decreased safety awareness;Decreased knowledge of precautions;Cardiopulmonary status limiting activity       PT Treatment Interventions DME instruction;Gait training;Stair training;Functional mobility training;Therapeutic exercise;Therapeutic activities;Balance training;Neuromuscular re-education;Cognitive remediation;Patient/family education    PT Goals (Current goals can be found in the Care Plan section)  Acute Rehab PT Goals Patient Stated Goal: to go home PT Goal Formulation: With patient Time For Goal Achievement: 06/23/22 Potential to Achieve Goals: Fair    Frequency Min 1X/week     Co-evaluation PT/OT/SLP Co-Evaluation/Treatment: Yes Reason for Co-Treatment: To address functional/ADL transfers;For patient/therapist safety PT goals addressed during session:  Mobility/safety with mobility OT goals addressed during session: ADL's and self-care       AM-PAC PT "6 Clicks" Mobility  Outcome Measure Help needed turning from your back to your side while in a flat bed without using bedrails?: A Little Help needed moving from lying on your back to sitting on the side of a flat bed without using bedrails?: A Little Help needed moving to and from a bed to a chair (including a wheelchair)?: A Little Help needed standing up from a chair using your arms (e.g., wheelchair or bedside chair)?: A Little Help needed to walk in hospital room?: A Lot Help needed climbing 3-5 steps with a railing? : Total 6 Click Score: 15    End of Session Equipment Utilized During Treatment: Gait belt;Oxygen Activity Tolerance: Treatment limited secondary to medical complications (Comment) (BP and reporting dizziness) Patient left: in bed;with call bell/phone within reach;with nursing/sitter in room Nurse Communication: Mobility status;Other (comment) (BP) PT Visit Diagnosis: Other abnormalities of gait and mobility (R26.89);Muscle weakness (generalized) (M62.81);Difficulty in walking, not elsewhere classified (R26.2);History of falling (Z91.81)    Time: 2130-8657 PT Time Calculation (min) (ACUTE ONLY): 27 min   Charges:   PT Evaluation $PT Eval Moderate Complexity: 1 Mod PT Treatments $Therapeutic Activity: 8-22 mins        Stanley [name], PT, DPT Acute Rehab Services [phone]   Stanley [name] 06/09/2022, 4:27 PM

## 2022-06-09 NOTE — Assessment & Plan Note (Signed)
Recent medication noncompliance -Appreciate cardiology -Started Toprol, losartan. Slowly adding back GDMT per cardiology -F/u echo

## 2022-06-09 NOTE — Progress Notes (Signed)
   06/09/22 0808  Vitals  Temp 99.2 F (37.3 C)  Temp Source Axillary  BP (!) 155/106  MAP (mmHg) 121  BP Location Right Arm  BP Method Automatic  Patient Position (if appropriate) Lying  Pulse Rate (!) 117  Resp (!) 34  Level of Consciousness  Level of Consciousness Alert  MEWS COLOR  MEWS Score Color Red  Oxygen Therapy  SpO2 99 %  O2 Device Bi-PAP  MEWS Score  MEWS Temp 0  MEWS Systolic 0  MEWS Pulse 2  MEWS RR 2  MEWS LOC 0  MEWS Score 4   MD aware of patient's change in status, orders already in epic. Patient placed on BIPAP and tolerating well. Elnita Maxwell, RN

## 2022-06-09 NOTE — Evaluation (Signed)
Clinical/Bedside Swallow Evaluation Patient Details  Name: Stanley [name] MRN: [id number] Date of Birth: [birth year]  Today's Date: 06/09/2022 Time: SLP Start Time (ACUTE ONLY): 1515 SLP Stop Time (ACUTE ONLY): 1530 SLP Time Calculation (min) (ACUTE ONLY): 15 min  Past Medical History:  Past Medical History:  Diagnosis Date   Acute renal failure (ARF) (HCC)    Acute respiratory failure with hypoxia (HCC) 12/29/2018   Alcohol abuse    Anxiety    CAD (coronary artery disease)    a. dx unclear, reported PCI in 2011 but cath 2014 normal coronaries and cardiac CT 07/2016 normal coronaries, no mention of stents.   Chronic chest pain    Chronic systolic CHF (congestive heart failure) (HCC)    Depression    Dyspnea    07/05/2019: per patient some times with exertion, "has to catch breath"   Family history of breast cancer 05/30/2021   Family history of pancreatic cancer 05/30/2021   Financial difficulties    Gout    Gouty arthritis    Headache    History of noncompliance with medical treatment    financial challenges   Hypertension    ICD (implantable cardioverter-defibrillator) in place    [place] [employer] ICD implant done in [place] 2015   Insomnia    Lobar pneumonia (HCC) 12/28/2018   Myocardial infarction (HCC) 2005   Nonischemic cardiomyopathy (HCC)    Sleep apnea    per patient, has CPAP does not wear it every night   Ventricular tachycardia (HCC) 01/2015   2017 - ATP delivered for sinus tach, degenerated into VT for which he received shock. Recurrent VT 03/2018.   Past Surgical History:  Past Surgical History:  Procedure Laterality Date   BAROREFLEX SYSTEM INSERTION     BIOPSY  03/06/2021   Procedure: BIOPSY;  Surgeon: Benancio Deeds, MD;  Location: [facility];  Service: Gastroenterology;;   CARDIAC DEFIBRILLATOR PLACEMENT  06/05/2013   [employer] Inogen ICD implanted at [facility] in [place] for primary prevention   COLONOSCOPY WITH PROPOFOL N/A 03/06/2021    Procedure: COLONOSCOPY WITH PROPOFOL;  Surgeon: Benancio Deeds, MD;  Location: [facility];  Service: Gastroenterology;  Laterality: N/A;   ESOPHAGEAL DILATION  03/06/2021   Procedure: ESOPHAGEAL DILATION;  Surgeon: Benancio Deeds, MD;  Location: [facility];  Service: Gastroenterology;;   ESOPHAGOGASTRODUODENOSCOPY (EGD) WITH PROPOFOL N/A 03/06/2021   Procedure: ESOPHAGOGASTRODUODENOSCOPY (EGD) WITH PROPOFOL;  Surgeon: Benancio Deeds, MD;  Location: [facility];  Service: Gastroenterology;  Laterality: N/A;   ESOPHAGOGASTRODUODENOSCOPY (EGD) WITH PROPOFOL N/A 05/18/2021   Procedure: ESOPHAGOGASTRODUODENOSCOPY (EGD) WITH PROPOFOL;  Surgeon: Sherrilyn Rist, MD;  Location: [facility] ENDOSCOPY;  Service: Gastroenterology;  Laterality: N/A;   HERNIA REPAIR     PERCUTANEOUS CORONARY STENT INTERVENTION (PCI-S)  01/05/2009   POLYPECTOMY  03/06/2021   Procedure: POLYPECTOMY;  Surgeon: Benancio Deeds, MD;  Location: [facility];  Service: Gastroenterology;;   HPI:  [name] with PMH: MI, CAD, HTN, Gout,  ETOH abuse, esophageal dysphagia (2cm hiatal hernia h/o moderate stenosis at GE junction). He presented to the hospital on 06/08/22 with AICD discharge x3 after which he felt nauseous and vomitted. He has apparently been unable to keep his food down for past couple months and has not taken his medications due to being depressed and tired. On 06/09/22 patient had increased oxygen requirement from 7L to 15L HFNC and then required BiPAP. He was eventually transitioned back to [facility]. In morning of 6/4,  he apparently choked on a banana and shortly thereafter had an episode of desaturation and possibly increased WOB. CXR was obtained and revealed pulmonary edema R > L for which he received Lasix. There is also some concern for possible aspiration and SLP swallow evaluation ordered.    Assessment / Plan / Recommendation  Clinical Impression  Patient did not present with any clinical  s/s of dysphagia as per this bedside swallow evaluation, however he has a h/o esophageal stricture and hiatal hernia. Currently, he is on 40L oxygen via HHFNC. His voice was clear and strong and vitals were [facility]. Patient was confused but pleasant, requiring some cues to initiate self-feeding. He consumed thin liquids (water), puree solids (applesauce) and soft solids (graham cracker). Swallow initiation appeared timely and no overt s/s aspiration observed during or after PO intake.  Although patient not exhibiting any overt s/s, he did have an apparent choking incident this morning and he does have h/o esophageal stenosis and so SLP will plan to f/u at least one time to ensure toleration. SLP Visit Diagnosis: Dysphagia, unspecified (R13.10)    Aspiration Risk  Mild aspiration risk    Diet Recommendation Regular;Thin liquid   Liquid Administration via: Cup;Straw Medication Administration: Whole meds with liquid Supervision: Patient able to self feed;Intermittent supervision to cue for compensatory strategies Compensations: Slow rate;Small sips/bites Postural Changes: Seated upright at 90 degrees;Remain upright for at least 30 minutes after po intake    Other  Recommendations Oral Care Recommendations: Oral care BID    Recommendations for follow up therapy are one component of a multi-disciplinary discharge planning process, led by the attending physician.  Recommendations may be updated based on patient status, additional functional criteria and insurance authorization.  Follow up Recommendations Follow physician's recommendations for discharge plan and follow up therapies      Assistance Recommended at Discharge    Functional Status Assessment Patient has had a recent decline in their functional status and demonstrates the ability to make significant improvements in function in a reasonable and predictable amount of time.  Frequency and Duration min 1 x/week  1 week       Prognosis  Prognosis for improved oropharyngeal function: Good Barriers to Reach Goals: Cognitive deficits      Swallow Study   General Date of Onset: 06/09/22 HPI: [name] with PMH: MI, CAD, HTN, Gout,  ETOH abuse, esophageal dysphagia (2cm hiatal hernia h/o moderate stenosis at GE junction). He presented to the hospital on 06/08/22 with AICD discharge x3 after which he felt nauseous and vomitted. He has apparently been unable to keep his food down for past couple months and has not taken his medications due to being depressed and tired. On 06/09/22 patient had increased oxygen requirement from 7L to 15L HFNC and then required BiPAP. He was eventually transitioned back to [facility]. In morning of 6/4, he apparently choked on a banana and shortly thereafter had an episode of desaturation and possibly increased WOB. CXR was obtained and revealed pulmonary edema R > L for which he received Lasix. There is also some concern for possible aspiration and SLP swallow evaluation ordered. Type of Study: Bedside Swallow Evaluation Previous Swallow Assessment: none found Diet Prior to this Study: NPO Temperature Spikes Noted: No Respiratory Status: Nasal cannula History of Recent Intubation: No Behavior/Cognition: Alert;Pleasant mood;Confused;Cooperative Oral Cavity Assessment: Within Functional Limits Oral Care Completed by SLP: No Oral Cavity - Dentition: Adequate natural dentition Vision: Functional for self-feeding Self-Feeding Abilities: Able to  feed self Patient Positioning: Upright in bed Baseline Vocal Quality: Normal Volitional Cough: Strong Volitional Swallow: Able to elicit    Oral/Motor/Sensory Function Overall Oral Motor/Sensory Function: Within functional limits   Ice Chips     Thin Liquid Thin Liquid: Within functional limits Presentation: Straw;Self Fed    Nectar Thick     Honey Thick     Puree Puree: Within functional limits Presentation: Self Fed;Spoon   Solid     Solid:  Within functional limits Presentation: Self Fed      Angela Nevin, MA, CCC-SLP Speech Therapy

## 2022-06-09 NOTE — Progress Notes (Signed)
Patient Name: Stanley [name] Date of Encounter: 06/09/2022  Primary Cardiologist: Arvilla Meres, MD Electrophysiologist: Dr. Lalla Brothers  Interval Summary   Noted worsening O2 status overnight from 7L HFNC to 15L HFNC with increased respiratory effort and sats in mid 80s -> now on BiPAP  Agree with IV lasix; Also ordered for CTA to r/o PE.   Worsening SOB was also associated with a coughing episode while eating.   Pt speaking over BiPAP on my exam and asking if he can go home tomorrow.  Inpatient Medications    Scheduled Meds:  atorvastatin  20 mg Oral Daily   enoxaparin (LOVENOX) injection  40 mg Subcutaneous Q24H   folic acid  1 mg Oral Daily   furosemide  80 mg Intravenous Once   lidocaine  1 patch Transdermal Q24H   losartan  25 mg Oral Daily   metoprolol succinate  25 mg Oral Daily   multivitamin with minerals  1 tablet Oral Daily   pantoprazole  40 mg Oral Daily   PARoxetine  20 mg Oral Daily   thiamine  100 mg Intravenous Daily   thiamine  100 mg Oral Daily   traZODone  150 mg Oral QHS   Continuous Infusions:  amiodarone 30 mg/hr (06/09/22 0048)   PRN Meds: alum & mag hydroxide-simeth, guaiFENesin, LORazepam, prochlorperazine   Vital Signs    Vitals:   06/09/22 0438 06/09/22 0445 06/09/22 0718 06/09/22 0808  BP:  (!) 154/106  (!) 155/106  Pulse:  (!) 110 (!) 121 (!) 117  Resp:  (!) 21 (!) 30 (!) (P) 34  Temp:  98.2 F (36.8 C)  (P) 99.2 F (37.3 C)  TempSrc:  Oral  (P) Axillary  SpO2:  93% 95% 99%  Weight: 63 kg     Height:        Intake/Output Summary (Last 24 hours) at 06/09/2022 0814 Last data filed at 06/09/2022 7829 Gross per 24 hour  Intake 450 ml  Output 700 ml  Net -250 ml   Filed Weights   06/08/22 1123 06/08/22 1759 06/09/22 0438  Weight: 68 kg 66.5 kg 63 kg    Physical Exam    GEN- The patient is acutely ill appearing, alert and oriented x 3 today.   Lungs-  Diminished through, with increased respiratory effort on BiPAP.   Cardiac- Tachy and regular rate and rhythm, no murmurs, rubs or gallops GI- soft, NT, ND, + BS Extremities- no clubbing or cyanosis. No edema  Telemetry    Sinus tachycardia 110-120s (personally reviewed)  EKG this am appears to show Sinus tach at 118 bpm  Hospital Course    Berthold Walther is a 57 y.o. male with a history of chronic systolic CHF, NICM, ETOH abuse, Depression, DM2, and h/o VT on amiodarone, who is being seen today for the evaluation of ICD shock at the request of Dr. Rodena Medin.   Assessment & Plan    VT with ICD shock In setting of med non-compliance, ETOH abuse, and hypokalemia.  ? Worsening CHF as well.  Continue IV amiodarone Appears sinus tach this am  Acute on chronic systolic CHF s/p Barostim (standard) and [employer] single chamber ICD Non-ischemic NYHA III chronically Non-compliant with coreg, losartan, Bidil, and spiro.  Not Entresto candidate (h/o angioedema)   IV lasix 80 mg this am and follow response. HF team to see.   Will need to gradually add GDMT back as tolerated.  Acutely worsening overnight with respiratory failure.  HS trop peaked at 304. Likely  in setting of VT/HF.  Calcium score of 0 in 2018.   Acute respiratory failure DDx includes A/C CHF, PE, aspiration (coughing episode) and worsening withdrawal from ETOH Pending CXR and CTA.  IV lasix dosed.    Hypokalemia K 4.1 this am    ETOH abuse Drinks most days, most of the day.  States he had 5 shots last night before bed.  Blood level 100 mg/dl on admission (consistent with 0.10 BAC) CIWA protocol in place   Patient is critically ill and in danger of multiple system organ failure.    For questions or updates, please contact [facility] Please consult [email or web address] for contact info under Cardiology/STEMI.  Signed, Graciella Freer, PA-C  06/09/2022, 8:14 AM

## 2022-06-09 NOTE — Progress Notes (Signed)
Patient o2sat went down to 79. Called RT. Placed on 14 HFNC. MD also notified. EKG done as ordered

## 2022-06-09 NOTE — Progress Notes (Signed)
FMTS Brief Progress Note  S: Messaged by RN that patient is more confused tonight.  He is pulling at lines and he had to get mitts on.  He is doing well in terms of oxygen saturation but does pull off his high flow and has not been still and very restless the entire night.  Sister is in room who says that he has been continuously trying to get up and go around. In room patient is A&Ox1. Oriented to person, not to place (says [place] but is unable to tell me what building he is in), not oriented to situation, not oriented to time says that it is 1924,  O: BP 125/83 (BP Location: Right Arm)   Pulse (!) 114   Temp 99.9 F (37.7 C) (Oral)   Resp 20   Ht 5\' 5"  (1.651 m)   Wt 63 kg   SpO2 95%   BMI 23.13 kg/m    General: awake, hyperalert, responsive to questions, oriented only to person Head: Normocephalic atraumatic,  off of his nostrils CV: Tachycardia, difficult to auscultate due to movement Respiratory: much clearer than previous exam, no rales/crackles anterior lung fields chest rises symmetrically,  no increased work of breathing on 20 L HFNC Neuro: No focal deficits, moving all extremities, no facial weakness  A/P: Ddx: Alcohol-Last CIWA 14, very restless has been getting ativan-last given at 9 PM Acidosis/respiratory-possible, will obtain VBG, doing well with HFNC saturation right now, lung exam more clear from previous exam Arrhythmia/Cardiac-tachycardic, seems to be normal on rhythm strip Endocrine-obtain POC glucose-has been NPO today Electrolytes-highly likely this is deranged given receive 240 mg IV lasix today-no repeat BMP today will check STAT BMP Infection-Possible given aspiration event-temp 99.9, monitor fever curve, consider addition of abx Overdose/Opiates/Sedating meds-on ativan for withdrawal Uremia-unlikely, check stat BMP Trauma-unlikely Temperature-unlikely, normal Thiamine (Wernicke's)-receiving supplementation Insulin-none Psychiatric-possible  sundowning however this is new from him Stroke-moving all extremities, no facial weakness or slurred speech Seizure-unlikely, not noted - Orders reviewed. Labs for AM ordered, which was adjusted as needed.  - STAT BMP, CBC, VBG, Glucose - Continue CIWA protocol - Wrist restraints placed due  - If condition worsens, plan includes contacting PCCM, consider CT Head.   Levin Erp, MD 06/09/2022, 10:33 PM PGY-2, [facility] Night Resident  Please page [phone] with questions.

## 2022-06-10 ENCOUNTER — Inpatient Hospital Stay (HOSPITAL_COMMUNITY): Payer: Medicare Other

## 2022-06-10 DIAGNOSIS — R570 Cardiogenic shock: Secondary | ICD-10-CM

## 2022-06-10 DIAGNOSIS — R579 Shock, unspecified: Secondary | ICD-10-CM

## 2022-06-10 DIAGNOSIS — Z4502 Encounter for adjustment and management of automatic implantable cardiac defibrillator: Secondary | ICD-10-CM

## 2022-06-10 DIAGNOSIS — G9340 Encephalopathy, unspecified: Secondary | ICD-10-CM

## 2022-06-10 LAB — BASIC METABOLIC PANEL
Anion gap: 15 (ref 5–15)
Anion gap: 17 — ABNORMAL HIGH (ref 5–15)
BUN: 10 mg/dL (ref 6–20)
BUN: 15 mg/dL (ref 6–20)
CO2: 19 mmol/L — ABNORMAL LOW (ref 22–32)
CO2: 21 mmol/L — ABNORMAL LOW (ref 22–32)
Calcium: 8.8 mg/dL — ABNORMAL LOW (ref 8.9–10.3)
Calcium: 9.1 mg/dL (ref 8.9–10.3)
Chloride: 96 mmol/L — ABNORMAL LOW (ref 98–111)
Chloride: 97 mmol/L — ABNORMAL LOW (ref 98–111)
Creatinine, Ser: 1.43 mg/dL — ABNORMAL HIGH (ref 0.61–1.24)
Creatinine, Ser: 1.94 mg/dL — ABNORMAL HIGH (ref 0.61–1.24)
GFR, Estimated: 39 mL/min — ABNORMAL LOW (ref >60)
GFR, Estimated: 57 mL/min — ABNORMAL LOW (ref >60)
Glucose, Bld: 124 mg/dL — ABNORMAL HIGH (ref 70–99)
Glucose, Bld: 169 mg/dL — ABNORMAL HIGH (ref 70–99)
Potassium: 3.2 mmol/L — ABNORMAL LOW (ref 3.5–5.1)
Potassium: 4.5 mmol/L (ref 3.5–5.1)
Sodium: 132 mmol/L — ABNORMAL LOW (ref 135–145)
Sodium: 133 mmol/L — ABNORMAL LOW (ref 135–145)

## 2022-06-10 LAB — HEPATIC FUNCTION PANEL
ALT: 129 U/L — ABNORMAL HIGH (ref 0–44)
AST: 124 U/L — ABNORMAL HIGH (ref 15–41)
Albumin: 3 g/dL — ABNORMAL LOW (ref 3.5–5.0)
Alkaline Phosphatase: 82 U/L (ref 38–126)
Bilirubin, Direct: 0.6 mg/dL — ABNORMAL HIGH (ref 0.0–0.2)
Indirect Bilirubin: 1.4 mg/dL — ABNORMAL HIGH (ref 0.3–0.9)
Total Bilirubin: 2 mg/dL — ABNORMAL HIGH (ref 0.3–1.2)
Total Protein: 6.3 g/dL — ABNORMAL LOW (ref 6.5–8.1)

## 2022-06-10 LAB — CBC
HCT: 38.4 % — ABNORMAL LOW (ref 39.0–52.0)
HCT: 39.2 % (ref 39.0–52.0)
Hemoglobin: 12.9 g/dL — ABNORMAL LOW (ref 13.0–17.0)
Hemoglobin: 13.7 g/dL (ref 13.0–17.0)
MCH: 33.7 pg (ref 26.0–34.0)
MCH: 34 pg (ref 26.0–34.0)
MCHC: 33.6 g/dL (ref 30.0–36.0)
MCHC: 34.9 g/dL (ref 30.0–36.0)
MCV: 100.3 fL — ABNORMAL HIGH (ref 80.0–100.0)
MCV: 97.3 fL (ref 80.0–100.0)
Platelets: 147 10*3/uL — ABNORMAL LOW (ref 150–400)
Platelets: 157 10*3/uL (ref 150–400)
RBC: 3.83 MIL/uL — ABNORMAL LOW (ref 4.22–5.81)
RBC: 4.03 MIL/uL — ABNORMAL LOW (ref 4.22–5.81)
RDW: 11.9 % (ref 11.5–15.5)
RDW: 12.5 % (ref 11.5–15.5)
WBC: 10.4 10*3/uL (ref 4.0–10.5)
WBC: 11.5 10*3/uL — ABNORMAL HIGH (ref 4.0–10.5)
nRBC: 0 % (ref 0.0–0.2)
nRBC: 0.2 % (ref 0.0–0.2)

## 2022-06-10 LAB — BLOOD GAS, VENOUS
Acid-Base Excess: 0.4 mmol/L (ref 0.0–2.0)
Bicarbonate: 21.4 mmol/L (ref 20.0–28.0)
O2 Saturation: 96.1 %
Patient temperature: 37
pCO2, Ven: 25 mmHg — ABNORMAL LOW (ref 44–60)
pH, Ven: 7.54 — ABNORMAL HIGH (ref 7.25–7.43)
pO2, Ven: 68 mmHg — ABNORMAL HIGH (ref 32–45)

## 2022-06-10 LAB — MAGNESIUM
Magnesium: 1.6 mg/dL — ABNORMAL LOW (ref 1.7–2.4)
Magnesium: 3.4 mg/dL — ABNORMAL HIGH (ref 1.7–2.4)

## 2022-06-10 LAB — SURGICAL PCR SCREEN
MRSA, PCR: NEGATIVE
Staphylococcus aureus: NEGATIVE

## 2022-06-10 LAB — COOXEMETRY PANEL
Carboxyhemoglobin: 2.3 % — ABNORMAL HIGH (ref 0.5–1.5)
Methemoglobin: <0.7 % (ref 0.0–1.5)
O2 Saturation: 59.8 %
Total hemoglobin: 10.4 g/dL — ABNORMAL LOW (ref 12.0–16.0)

## 2022-06-10 LAB — LACTIC ACID, PLASMA
Lactic Acid, Venous: 2.6 mmol/L (ref 0.5–1.9)
Lactic Acid, Venous: 1.7 mmol/L (ref 0.5–1.9)

## 2022-06-10 LAB — PHOSPHORUS: Phosphorus: 1.1 mg/dL — ABNORMAL LOW (ref 2.5–4.6)

## 2022-06-10 LAB — GLUCOSE, CAPILLARY: Glucose-Capillary: 162 mg/dL — ABNORMAL HIGH (ref 70–99)

## 2022-06-10 LAB — BRAIN NATRIURETIC PEPTIDE: B Natriuretic Peptide: 2797.6 pg/mL — ABNORMAL HIGH (ref 0.0–100.0)

## 2022-06-10 LAB — MRSA NEXT GEN BY PCR, NASAL: MRSA by PCR Next Gen: NOT DETECTED

## 2022-06-10 LAB — HEMOGLOBIN A1C
Hgb A1c MFr Bld: 4.4 % — ABNORMAL LOW (ref 4.8–5.6)
Mean Plasma Glucose: 80 mg/dL

## 2022-06-10 MED ORDER — NOREPINEPHRINE 4 MG/250ML-% IV SOLN
INTRAVENOUS | Status: AC
  Administered 2022-06-10: 4 ug/min via INTRAVENOUS
  Filled 2022-06-10: qty 250

## 2022-06-10 MED ORDER — LORAZEPAM 2 MG/ML IJ SOLN
0.5000 mg | INTRAMUSCULAR | Status: DC | PRN
  Administered 2022-06-10: 2 mg via INTRAVENOUS

## 2022-06-10 MED ORDER — NOREPINEPHRINE 4 MG/250ML-% IV SOLN
0.0000 ug/min | INTRAVENOUS | Status: DC
  Administered 2022-06-10 (×3): 18 ug/min via INTRAVENOUS
  Filled 2022-06-10 (×2): qty 250

## 2022-06-10 MED ORDER — NOREPINEPHRINE 4 MG/250ML-% IV SOLN
2.0000 ug/min | INTRAVENOUS | Status: DC
  Administered 2022-06-10: 17 ug/min via INTRAVENOUS
  Filled 2022-06-10: qty 250

## 2022-06-10 MED ORDER — STERILE WATER FOR INJECTION IJ SOLN
INTRAMUSCULAR | Status: AC
  Filled 2022-06-10: qty 10

## 2022-06-10 MED ORDER — LORAZEPAM 2 MG/ML IJ SOLN: 0.5000 mg | INTRAMUSCULAR | Status: DC | PRN

## 2022-06-10 MED ORDER — SODIUM CHLORIDE 0.9 % IV SOLN
250.0000 mL | INTRAVENOUS | Status: DC
  Administered 2022-06-13: 250 mL via INTRAVENOUS

## 2022-06-10 MED ORDER — AMIODARONE HCL IN DEXTROSE 360-4.14 MG/200ML-% IV SOLN
30.0000 mg/h | INTRAVENOUS | Status: DC
  Administered 2022-06-10 – 2022-06-12 (×7): 30 mg/h via INTRAVENOUS
  Filled 2022-06-10 (×6): qty 200

## 2022-06-10 MED ORDER — SODIUM PHOSPHATES 45 MMOLE/15ML IV SOLN
30.0000 mmol | Freq: Once | INTRAVENOUS | Status: AC
  Administered 2022-06-10: 30 mmol via INTRAVENOUS
  Filled 2022-06-10: qty 10

## 2022-06-10 MED ORDER — DEXMEDETOMIDINE HCL IN NACL 400 MCG/100ML IV SOLN
0.0000 ug/kg/h | INTRAVENOUS | Status: DC
  Administered 2022-06-10: 0.4 ug/kg/h via INTRAVENOUS

## 2022-06-10 MED ORDER — LACTATED RINGERS IV BOLUS
500.0000 mL | Freq: Once | INTRAVENOUS | Status: AC
  Administered 2022-06-10: 500 mL via INTRAVENOUS

## 2022-06-10 MED ORDER — DEXMEDETOMIDINE HCL IN NACL 400 MCG/100ML IV SOLN: 0.0000 ug/kg/h | INTRAVENOUS | Status: DC

## 2022-06-10 MED ORDER — METRONIDAZOLE 500 MG/100ML IV SOLN
500.0000 mg | Freq: Two times a day (BID) | INTRAVENOUS | Status: DC
  Administered 2022-06-10 – 2022-06-11 (×4): 500 mg via INTRAVENOUS
  Filled 2022-06-10 (×4): qty 100

## 2022-06-10 MED ORDER — PHENTOLAMINE MESYLATE 5 MG IJ SOLR
5.0000 mg | Freq: Once | INTRAMUSCULAR | Status: AC
  Administered 2022-06-10: 5 mg via SUBCUTANEOUS
  Filled 2022-06-10: qty 5

## 2022-06-10 MED ORDER — LORAZEPAM 2 MG/ML IJ SOLN
INTRAMUSCULAR | Status: AC
  Filled 2022-06-10: qty 1

## 2022-06-10 MED ORDER — POTASSIUM CHLORIDE 10 MEQ/100ML IV SOLN
INTRAVENOUS | Status: AC
  Filled 2022-06-10: qty 100

## 2022-06-10 MED ORDER — DEXMEDETOMIDINE HCL IN NACL 400 MCG/100ML IV SOLN
INTRAVENOUS | Status: AC
  Administered 2022-06-10: 0.4 ug/kg/h via INTRAVENOUS
  Filled 2022-06-10: qty 100

## 2022-06-10 MED ORDER — POTASSIUM CHLORIDE 10 MEQ/100ML IV SOLN
10.0000 meq | INTRAVENOUS | Status: AC
  Administered 2022-06-10 (×6): 10 meq via INTRAVENOUS
  Filled 2022-06-10 (×5): qty 100

## 2022-06-10 MED ORDER — SODIUM CHLORIDE 0.9 % IV SOLN
2.0000 g | INTRAVENOUS | Status: DC
  Administered 2022-06-10 – 2022-06-11 (×2): 2 g via INTRAVENOUS
  Filled 2022-06-10 (×2): qty 20

## 2022-06-10 MED ORDER — MAGNESIUM SULFATE 4 GM/100ML IV SOLN
4.0000 g | Freq: Once | INTRAVENOUS | Status: AC
  Administered 2022-06-10: 4 g via INTRAVENOUS
  Filled 2022-06-10: qty 100

## 2022-06-10 MED ORDER — SODIUM CHLORIDE 0.9 % IV SOLN
3.0000 g | Freq: Four times a day (QID) | INTRAVENOUS | Status: DC
  Administered 2022-06-10: 3 g via INTRAVENOUS
  Filled 2022-06-10: qty 8

## 2022-06-10 MED ORDER — CHLORHEXIDINE GLUCONATE CLOTH 2 % EX PADS
6.0000 | MEDICATED_PAD | Freq: Every day | CUTANEOUS | Status: DC
  Administered 2022-06-10 – 2022-06-16 (×8): 6 via TOPICAL

## 2022-06-10 MED ORDER — PHENYLEPHRINE HCL-NACL 20-0.9 MG/250ML-% IV SOLN
25.0000 ug/min | INTRAVENOUS | Status: DC
  Administered 2022-06-10: 25 ug/min via INTRAVENOUS
  Filled 2022-06-10: qty 250

## 2022-06-10 MED ORDER — NITROGLYCERIN 2 % TD OINT
1.0000 [in_us] | TOPICAL_OINTMENT | Freq: Three times a day (TID) | TRANSDERMAL | Status: AC
  Administered 2022-06-10 – 2022-06-12 (×6): 1 [in_us] via TOPICAL
  Filled 2022-06-10: qty 30

## 2022-06-10 NOTE — Procedures (Signed)
Central Venous Catheter Insertion Procedure Note  Stanley [name]  [id number]  June 25, 1965  Date:06/10/22  Time:10:56 AM   Provider Performing:Tynetta Bachmann E Luis Nickles   Procedure: Insertion of Non-tunneled Central Venous [phone]) with US guidance ([id number])   Indication(s) Medication administration  Consent Risks of the procedure as well as the alternatives and risks of each were explained to the patient and/or caregiver.  Consent for the procedure was obtained and is signed in the bedside chart  Anesthesia Topical only with 1% lidocaine   Timeout Verified patient identification, verified procedure, site/side was marked, verified correct patient position, special equipment/implants available, medications/allergies/relevant history reviewed, required imaging and test results available.  Sterile Technique Maximal sterile technique including full sterile barrier drape, hand hygiene, sterile gown, sterile gloves, mask, hair covering, sterile ultrasound probe cover (if used).  Procedure Description Area of catheter insertion was cleaned with chlorhexidine and draped in sterile fashion.  With real-time ultrasound guidance a central venous catheter was placed into the right internal jugular vein. Placement of wire within vessel verified with ultrasound x 2 views prior to vessel dilation and placement of catheter.  Nonpulsatile blood flow and easy flushing noted in all ports.  The catheter was sutured in place and sterile dressing applied.  Complications/Tolerance None; patient tolerated the procedure well. Chest X-ray is ordered to verify placement for internal jugular or subclavian cannulation.    EBL Minimal  Specimen(s) None   Tessie Fass MSN, AGACNP-BC [facility] Pulmonary/Critical Care Medicine 06/10/2022, 10:58 AM

## 2022-06-10 NOTE — Progress Notes (Signed)
CSW received consult for substance use resources for patient. CSW following to complete consult when appropriate. CSW will continue to follow.

## 2022-06-10 NOTE — H&P (View-Only) (Signed)
  Advanced Heart Failure Rounding Note  PCP-Cardiologist: Daniel Bensimhon, MD   Subjective:    6/3: Admitted w/ ICD shocks for VT + intoxication and metabolic derangement (K 3.1, Mg 1.2)  6/4: Developed acute respiratory failure, acute pulmonary edema ? Aspiration PNA. Worsened overnight w/ hypotension, transferred to ICU and started on NEO. Amio gtt stopped   This morning, off BiPAP. Now on HHFNC, 40L/min. Altered. Oriented to person but not place or time. Agitated overnight, biting and pulling at IVs.   Cuff pressures w/ MAPs in the 50s this morning despite NEO. Now on NE peripherally.   No further VT on tele   K 4.5 Mg 3.4  Scr 1.14>>1.43>>1.94   PCT 0.46. On unasyn. AF. WBC pending   CIWA protocol   Objective:   Weight Range: 65.5 kg Body mass index is 24.03 kg/m.   Vital Signs:   Temp:  [97.4 F (36.3 C)-99.9 F (37.7 C)] 98 F (36.7 C) (06/05 0130) Pulse Rate:  [78-203] 78 (06/05 0600) Resp:  [18-40] 35 (06/05 0600) BP: (47-155)/(37-107) 52/37 (06/05 0600) SpO2:  [85 %-99 %] 93 % (06/05 0600) FiO2 (%):  [60 %-70 %] 62 % (06/05 0245) Weight:  [65.5 kg] 65.5 kg (06/05 0130) Last BM Date : 06/08/22  Weight change: Filed Weights   06/08/22 1759 06/09/22 0438 06/10/22 0130  Weight: 66.5 kg 63 kg 65.5 kg    Intake/Output:   Intake/Output Summary (Last 24 hours) at 06/10/2022 0805 Last data filed at 06/10/2022 0610 Gross per 24 hour  Intake 1240.02 ml  Output 2050 ml  Net -809.98 ml      Physical Exam    General:  alerted male, on HHFNC. No current agitation  HEENT: Normal Neck: Supple. Short thick neck, JVD to jaw Carotids 2+ bilat; no bruits. No lymphadenopathy or thyromegaly appreciated. Cor: PMI nondisplaced. Regular rate & rhythm. No rubs, gallops or murmurs. Lungs: decreased BS anteriorly  Abdomen: Soft, nontender, nondistended. No hepatosplenomegaly. No bruits or masses. Good bowel sounds. Extremities: No cyanosis, clubbing, rash,  edema Neuro: altered, only oriented to person. Not place or time.    Telemetry   NSR 84. No further VT   EKG    No new EKG to review   Labs    CBC Recent Labs    06/08/22 1132 06/08/22 1206 06/09/22 0128 06/09/22 2355  WBC 4.4  --  10.5 10.4  NEUTROABS 3.2  --   --   --   HGB 11.9*   < > 13.0 13.7  HCT 35.2*   < > 37.9* 39.2  MCV 105.4*  --  98.7 97.3  PLT 156  --  184 157   < > = values in this interval not displayed.   Basic Metabolic Panel Recent Labs    06/09/22 0128 06/09/22 2355 06/10/22 0552  NA 133* 133*  --   K 4.1 3.2*  --   CL 101 97*  --   CO2 18* 21*  --   GLUCOSE 152* 169*  --   BUN 11 10  --   CREATININE 1.13 1.43*  --   CALCIUM 8.8* 9.1  --   MG 1.8 1.6* 3.4*  PHOS  --   --  1.1*   Liver Function Tests Recent Labs    06/09/22 0128 06/10/22 0552  AST 235* 124*  ALT 177* 129*  ALKPHOS 102 82  BILITOT 2.1* 2.0*  PROT 6.9 6.3*  ALBUMIN 3.6 3.0*   Recent Labs      06/08/22 1326  LIPASE 60*   Cardiac Enzymes No results for input(s): "CKTOTAL", "CKMB", "CKMBINDEX", "TROPONINI" in the last 72 hours.  BNP: BNP (last 3 results) Recent Labs    06/10/22 0552  BNP 2,797.6*    ProBNP (last 3 results) No results for input(s): "PROBNP" in the last 8760 hours.   D-Dimer No results for input(s): "DDIMER" in the last 72 hours. Hemoglobin A1C Recent Labs    06/09/22 0936  HGBA1C 4.4*   Fasting Lipid Panel No results for input(s): "CHOL", "HDL", "LDLCALC", "TRIG", "CHOLHDL", "LDLDIRECT" in the last 72 hours. Thyroid Function Tests No results for input(s): "TSH", "T4TOTAL", "T3FREE", "THYROIDAB" in the last 72 hours.  Invalid input(s): "FREET3"  Other results:   Imaging    DG CHEST PORT 1 VIEW  Result Date: 06/10/2022 CLINICAL DATA:  Respiratory distress EXAM: PORTABLE CHEST 1 VIEW COMPARISON:  06/09/2022 FINDINGS: Defibrillator is again identified. Stimulator pack is seen on the right. Cardiac shadow is enlarged. Diffuse  airspace opacity is noted throughout the right lung although slightly improved when compared with the prior exam. Left lung remains clear. No bony abnormality is noted. IMPRESSION: Slight improved aeration in the right lung when compared with the previous day. Electronically Signed   By: Mark  Lukens M.D.   On: 06/10/2022 00:47   ECHOCARDIOGRAM COMPLETE  Result Date: 06/09/2022    ECHOCARDIOGRAM REPORT   Patient Name:   Stanley [name] Date of Exam: 06/09/2022 Medical Rec #:  [id number]         Height:       65.0 in Accession #:    [id number]        Weight:       139.0 lb Date of Birth:  06/08/1965         BSA:          1.695 m Patient Age:    56 years          BP:           130/99 mmHg Patient Gender: M                 HR:           108 bpm. Exam Location:  Inpatient Procedure: 2D Echo, Cardiac Doppler, Color Doppler and Intracardiac            Opacification Agent Indications:    Ventricular Tachycardia I47.2  History:        Patient has prior history of Echocardiogram examinations, most                 recent 12/30/2018. CHF and Cardiomyopathy, CAD, Defibrillator,                 ETOH abuse; Risk Factors:Hypertension, Former Smoker and Sleep                 Apnea.  Sonographer:    Norma Walker Referring Phys: [id number] PETER C MESSICK IMPRESSIONS  1. Left ventricular ejection fraction, by estimation, is <20%. The left ventricle has severely decreased function. The left ventricle demonstrates global hypokinesis. The left ventricular internal cavity size was mildly to moderately dilated. Left ventricular diastolic parameters are consistent with Grade I diastolic dysfunction (impaired relaxation).  2. Right ventricular systolic function is mildly reduced. The right ventricular size is normal. There is mildly elevated pulmonary artery systolic pressure. The estimated right ventricular systolic pressure is 44.5 mmHg.  3. Left atrial size was moderately dilated.  4. Right atrial size was mildly   dilated.  5. The mitral  valve is normal in structure. Moderate mitral valve regurgitation.  6. Tricuspid valve regurgitation is moderate to severe.  7. The aortic valve is normal in structure. Aortic valve regurgitation is not visualized.  8. The inferior vena cava is normal in size with <50% respiratory variability, suggesting right atrial pressure of 8 mmHg. FINDINGS  Left Ventricle: Left ventricular ejection fraction, by estimation, is <20%. The left ventricle has severely decreased function. The left ventricle demonstrates global hypokinesis. Definity contrast agent was given IV to delineate the left ventricular endocardial borders. The left ventricular internal cavity size was mildly to moderately dilated. There is no left ventricular hypertrophy. Left ventricular diastolic parameters are consistent with Grade I diastolic dysfunction (impaired relaxation). Right Ventricle: The right ventricular size is normal. No increase in right ventricular wall thickness. Right ventricular systolic function is mildly reduced. There is mildly elevated pulmonary artery systolic pressure. The tricuspid regurgitant velocity  is 3.02 m/s, and with an assumed right atrial pressure of 8 mmHg, the estimated right ventricular systolic pressure is 44.5 mmHg. Left Atrium: Left atrial size was moderately dilated. Right Atrium: Right atrial size was mildly dilated. Pericardium: There is no evidence of pericardial effusion. Mitral Valve: The mitral valve is normal in structure. Moderate mitral valve regurgitation. Tricuspid Valve: The tricuspid valve is normal in structure. Tricuspid valve regurgitation is moderate to severe. Aortic Valve: The aortic valve is normal in structure. Aortic valve regurgitation is not visualized. Pulmonic Valve: The pulmonic valve was normal in structure. Pulmonic valve regurgitation is mild. Aorta: The aortic root is normal in size and structure. Venous: The inferior vena cava is normal in size with less than 50% respiratory  variability, suggesting right atrial pressure of 8 mmHg. IAS/Shunts: No atrial level shunt detected by color flow Doppler. Additional Comments: A device lead is visualized.  LEFT VENTRICLE PLAX 2D LVIDd:         6.00 cm      Diastology LVIDs:         5.80 cm      LV e' medial:    10.50 cm/s LV PW:         1.10 cm      LV E/e' medial:  14.2 LV IVS:        0.90 cm      LV e' lateral:   8.06 cm/s LVOT diam:     2.00 cm      LV E/e' lateral: 18.5 LV SV:         35 LV SV Index:   21 LVOT Area:     3.14 cm  LV Volumes (MOD) LV vol d, MOD A2C: 240.0 ml LV vol d, MOD A4C: 164.0 ml LV vol s, MOD A2C: 222.0 ml LV vol s, MOD A4C: 130.0 ml LV SV MOD A2C:     18.0 ml LV SV MOD A4C:     164.0 ml LV SV MOD BP:      25.4 ml RIGHT VENTRICLE RV S prime:     12.10 cm/s TAPSE (M-mode): 1.1 cm LEFT ATRIUM           Index        RIGHT ATRIUM           Index LA diam:      4.60 cm 2.71 cm/m   RA Area:     16.60 cm LA Vol (A2C): 71.4 ml 42.13 ml/m  RA Volume:   46.30 ml  27.32 ml/m LA   Vol (A4C): 66.6 ml 39.30 ml/m  AORTIC VALVE             PULMONIC VALVE LVOT Vmax:   79.30 cm/s  PR End Diast Vel: 13.69 msec LVOT Vmean:  52.000 cm/s LVOT VTI:    0.111 m  AORTA Ao Root diam: 2.80 cm Ao Asc diam:  3.20 cm MITRAL VALVE                 TRICUSPID VALVE MV Area (PHT): 7.66 cm      TR Peak grad:   36.5 mmHg MV Decel Time: 99 msec       TR Vmax:        302.00 cm/s MR Peak grad:   91.1 mmHg MR Mean grad:   62.0 mmHg    SHUNTS MR Vmax:        477.33 cm/s  Systemic VTI:  0.11 m MR Vmean:       369.0 cm/s   Systemic Diam: 2.00 cm MR PISA:        1.01 cm MR PISA Radius: 0.40 cm MV E velocity: 149.00 cm/s Aditya Sabharwal Electronically signed by Aditya Sabharwal Signature Date/Time: 06/09/2022/2:30:42 PM    Final    DG Chest Port 1 View  Result Date: 06/09/2022 CLINICAL DATA:  Dyspnea. EXAM: PORTABLE CHEST 1 VIEW COMPARISON:  Radiograph yesterday FINDINGS: Development of extensive asymmetric right lung opacities with a slight perihilar  distribution. There also new confluent opacities at the retrocardiac left lung base. Stable cardiomegaly with single lead pacemaker in place. Battery pack for a neural stimulator on the right. A small right pleural effusion with fluid in the fissures. No pneumothorax. IMPRESSION: 1. Development of diffuse right lung opacities and left basilar opacities that may represent asymmetric pulmonary edema or multifocal pneumonia. Pulmonary edema is favored given rapid development since yesterday. 2. Stable cardiomegaly. Electronically Signed   By: Melanie  Sanford M.D.   On: 06/09/2022 10:53     Medications:     Scheduled Medications:  aspirin  81 mg Oral Pre-Cath   atorvastatin  20 mg Oral Daily   Chlorhexidine Gluconate Cloth  6 each Topical Daily   dapagliflozin propanediol  10 mg Oral Daily   enoxaparin (LOVENOX) injection  40 mg Subcutaneous Q24H   folic acid  1 mg Oral Daily   furosemide  80 mg Intravenous TID   isosorbide-hydrALAZINE  0.5 tablet Oral TID   lidocaine  1 patch Transdermal Q24H   LORazepam       losartan  25 mg Oral Daily   multivitamin with minerals  1 tablet Oral Daily   mupirocin ointment  1 Application Nasal BID   pantoprazole  40 mg Oral Daily   PARoxetine  20 mg Oral Daily   sodium chloride flush  3 mL Intravenous Q12H   spironolactone  12.5 mg Oral Daily   thiamine  100 mg Intravenous Daily   thiamine  100 mg Oral Daily   traZODone  150 mg Oral QHS    Infusions:  sodium chloride Stopped (06/10/22 0531)   sodium chloride 10 mL/hr at 06/10/22 0600   sodium chloride     amiodarone 30 mg/hr (06/10/22 0600)   ampicillin-sulbactam (UNASYN) IV Stopped (06/10/22 0518)   dexmedetomidine     dexmedetomidine (PRECEDEX) IV infusion Stopped (06/10/22 0459)   phenylephrine (NEO-SYNEPHRINE) Adult infusion 25 mcg/min (06/10/22 0632)   potassium chloride 10 mEq (06/10/22 0638)   sodium phosphate 30 mmol in dextrose 5 % 250 mL infusion        PRN Medications: sodium  chloride, alum & mag hydroxide-simeth, dexmedetomidine, guaiFENesin, LORazepam, LORazepam, LORazepam, prochlorperazine, sodium chloride flush    Patient Profile   Stanley [name] is a 56 y.o. male with systolic HF due to NICM d/x'd in 2006 (felt 2/2 HTN/ETOH), ? CAD s/p PCI to unknown vessel 2011 at outside location (cath 2014 without CAD and normal coronaries by CT 07/2016), VT s/p [employer] ICD implant ([place] 2015), noncompliance, chronic noncardiac chest pain, depression, HTN and ETOH abuse.  Admitted with VT with ICD shocks in the setting of acute decompensated HF, med noncompliance, ETOH intoxication and electrolyte derangements. AHF to see for acute on chronic systolic heart failure. Also w/ possible aspiration PNA.   Assessment/Plan   1. VT: Shocks from [employer] ICD, now in NSR on amiodarone gtt. No prior chest pain, prior coronary workups have shown no significant coronary disease. HS-TnI mildly elevated with no trend, suspect demand ischemia from volume overload and VT arrest.  Suspect arrest was due to underlying NICM, off meds, with hypokalemia and worsening CHF.  No further VT on tele. Amio gtt stopped overnight due to hypotension and bradycardia.  - EP following  - Keep K > 4.0 and Mg > 2.0  2. Acute on chronic systolic CHF: Patient with history of NICM, last coronary evaluation was CTA in 2018 with normal coronaries and calcium score 0.  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  Most recent prior echo in 2021 with EF 25-30%.  Echo this admission with EF < 20%, mild RV dysfunction, moderate-severe TR, moderate MR. Developed acute pulmonary edema yesterday and diuresed w/ IV Lasix. Now w/ hypotension requiring pressor support and AKI, SCr bumped 1.13>>1.94 - Needs central access. D/w CCM at bedside. Will place CVC and check Co-ox and CVP monitoring  - Stop phenylephrine and use NE support. Keep MAP > 65. Will need a-line - If additional support needed can add VP to limit risk of further  VT  - Hold Losartan, Bidil, Jardiance and Metoprolol  - Will need RHC to assess filling pressures and cardiac output, can do this tomorrow (defer today given agitation) - Not candidate for advanced therapies at this time with heavy ETOH abuse.  3. ETOH abuse: Intoxicated at admission. Heavy ETOH use, likely contributes to cardiomyopathy.  - CIWA protocol.  4. Suspected Aspiration PNA/ ? Septic Shock w/ New Pressor Requirements: PCT 0.46. Started on Unasyn  - stop Unasyn given HF/ sodium load. Switch to ceftriaxone and flagyl.  - check CBC and follow PCT  5. AKI: SCr bumped 1.13>>1.94. Suspect due to hypotension +/- cardiorenal  - continue NE support  - place CVC and follow co-ox  - follow BMP    Length of Stay: 1  Brittainy Simmons, PA-C  06/10/2022, 8:05 AM  Advanced Heart Failure Team Pager [phone] (M-F; 7a - 5p)  Please contact [facility] for night-coverage after hours (5p -7a ) and weekends on [email or web address]  Patient seen with PA, agree with the above note.   Severe ETOH withdrawal overnight, transferred to CCU.  Suspect aspiration PNA with diffuse airspace opacity on the right.  He became hypotensive and was started on peripheral phenylephrine, amiodarone was stopped.  No further VT. Creatinine up to 1.94.   He is awake but drowsy, confused.  HR improved in 80s.   General: NAD Neck: JVP 12 cm, no thyromegaly or thyroid nodule.  Lungs: Decreased at bases.  CV: Nondisplaced PMI.  Heart regular S1/S2, no S3/S4, 2/6 HSM LLSB/apex.  No peripheral edema.     Abdomen: Soft, nontender, no hepatosplenomegaly, no distention.  Skin: Intact without lesions or rashes.  Neurologic: Drowsy, confused.   Psych: Normal affect. Extremities: No clubbing or cyanosis.  HEENT: Normal.   Severe ETOH withdrawal, confused this morning but HR lower.  CIWA protocol, management per CCM.   On Unasyn for aspiration PNA, will transition to ceftriaxone/Flagyl to avoid sodium load. CXR with right-sided  infiltrate. PCT 0.46.   Hypotensive, ?cardiogenic shock in setting of severe ETOH withdrawal.  - Would wean off phenylephrine and onto norepinephrine for now.  - Needs arterial line and central line. Will follow CBC and co-ox.   - If co-ox low, would start dobutamine.  - Will need diuresis when BP is stabilized and lines in.  - check lactate - With severe ETOH withdrawal and confusion, will hold off on RHC (consider tomorrow).   Would restart amiodarone gtt at 30 mg/hr for VT.   CRITICAL CARE Performed by: Lendon George  Total critical care time: 40 minutes  Critical care time was exclusive of separately billable procedures and treating other patients.  Critical care was necessary to treat or prevent imminent or life-threatening deterioration.  Critical care was time spent personally by me on the following activities: development of treatment plan with patient and/or surrogate as well as nursing, discussions with consultants, evaluation of patient's response to treatment, examination of patient, obtaining history from patient or surrogate, ordering and performing treatments and interventions, ordering and review of laboratory studies, ordering and review of radiographic studies, pulse oximetry and re-evaluation of patient's condition.  Georgianna Band 06/10/2022 8:57 AM  

## 2022-06-10 NOTE — Progress Notes (Signed)
eLink Physician-Brief Progress Note Patient Name: Stanley [name] DOB: [birth year] MRN: [id number]   Date of Service  06/10/2022  HPI/Events of Note  Patient dropped his blood pressure,  eICU Interventions  Precedex and Phenylephrine gtt held, LR 500 ml iv fluid bolus x 1, Phenylephrine gtt if hypotension persists after fluid bolus.        Thomasene Lot [name] 06/10/2022, 5:06 AM

## 2022-06-10 NOTE — Consult Note (Signed)
NAME:  Idrissa Mattucci, MRN:  [id number], DOB:  1965-10-24, LOS: 1 ADMISSION DATE:  06/08/2022, CONSULTATION DATE:  06/09/22 REFERRING MD:  Pollie Meyer CHIEF COMPLAINT:  Dyspnea   History of Present Illness:  Garek Dicker is a 57 y.o. male who has a PMH as below. He was admitted 6/3 after ICD discharged multiple times at his home. He has both ICD and Barostim devices in place. He was seen by EP and his ICD discharges were found to be 2/2 VT presumed in the setting of medication non-compliance, EtOH intoxication and associated electrolyte derangements.  He was started on Amiodarone and was admitted for further workup. He is being seen by EP as well as AHF.  AM 6/4, he apparently choked on a banana and shortly thereafter, had an episode of desaturation and possibly increased WOB. His O2 via [place] was turned up but did not improve his hypoxia; therefore, he was placed on BiPAP. CXR was obtained and revealed pulmonary edema R > L for which he received Lasix. There is also some concern for possible aspiration.  PCCM called in consultation AM 6/4 for further recs. At the time of our evaluation, pt was on BiPAP and asking why he was still on it. ABG from earlier this morning showed respiratory alkalosis (7.47/28/184).  He tells [place] that he has chronic dysphagia to both solids and liquids and that he has been evaluated by GI in the past along with abdominal pain and bloating. Review of his chart shows that he saw GI in 2022  and had EGD 03/06/21 that demonstrated a 1cm hiatal hernia and he had empiric esophageal dilation using a savary dilator with mild resistance at 17mm. He was started on Zofran and his Protonix was increased from QD to BID dosing. 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 There was no reason to explain his bleeding.   Echo 2018: ef 35-40% 2020: 25-30% 2021; 25-30% 56/4/24: < 25%  Pertinent  Medical  History:  has Nonischemic cardiomyopathy (HCC); Chronic systolic dysfunction of left ventricle; Essential hypertension; Gout; Implantable cardioverter-defibrillator (ICD) in situ; ETOH abuse; AKI (acute kidney injury) (HCC); History of noncompliance with medical treatment, presenting hazards to health; Ventricular tachycardia (HCC); Obstructive sleep apnea; Chronic systolic heart failure (HCC); Moderate episode of recurrent major depressive disorder (HCC); CAD (coronary artery disease); Congestive heart failure (CHF) (HCC); Gastroesophageal reflux disease; Colon cancer screening; Benign neoplasm of colon; Abdominal pain, epigastric; Early satiety; Dysphagia; Hematemesis; ABLA (acute blood loss anemia); Diverticulosis of colon without hemorrhage; Family history of breast cancer; Family history of pancreatic cancer; Genetic testing; AICD discharge; Abdominal pain; Anxiety and depression; Acute hypoxic respiratory failure (HCC); and Acute on chronic combined systolic and diastolic CHF (congestive heart failure) (HCC) on their problem list.  Significant Hospital Events: Including procedures, antibiotic start and stop dates in addition to other pertinent events   6/3 admit 6/4 PCCM consult - BiPAP removed, placed on [place] and have asked RT to start HHFNC. He is answering questions appropriately. Says his breathing feels ok. Has ongoing abdominal discomfort but says this is chronic. Admits to chronic dysphagia and admits to choking on a piece of banana earlier. He does not feel that he aspirated and says he was able to cough it up pretty easily.  Interim History / Subjective:    06/10/22 early hours: called by [facility] residents to evlatue ongoing respiratory failure HFNC need and tachypnea despite significant lasix.  And ongoing DTS with agitation CIWA > 20 despite Ativan.CCM transffered him to ICU and took over as primary.  RHC pending later for 06/10/22  Objective:  Blood pressure (!) 126/95, pulse (!) 134,  temperature 98 F (36.7 C), temperature source Oral, resp. rate 20, height 5\' 5"  (1.651 m), weight 63 kg, SpO2 96 %.    FiO2 (%):  [60 %-100 %] 60 %   Intake/Output Summary (Last 24 hours) at 06/10/2022 0030 Last data filed at 06/09/2022 2000 Gross per 24 hour  Intake 214.49 ml  Output 1800 ml  Net -1585.51 ml   Filed Weights   06/08/22 1123 06/08/22 1759 06/09/22 0438  Weight: 68 kg 66.5 kg 63 kg    Examination: General: Adult male, resting in bed, in NAD. Neuro: A&O x 3, no deficits. HEENT: [place]/AT. Sclerae anicteric. EOMI. Cardiovascular: RRR, no M/R/G.  Lungs: Respirations even and unlabored.  Coarse bilaterally R > L. Abdomen: BS x 4, soft, NT/ND.  Musculoskeletal: No gross deformities, no edema.  Skin: Intact, warm, no rashes.  Labs/imaging personally reviewed:  CXR 6/4 > bilateral opacities R > L, edema vs aspiration  Assessment & Plan:  Acute agiated encephalopathy   - due to DTS  Plan  - start precedex (he is calmer since starting)  = add CIWA protocol   Acute hypoxic respiratory failure - presumed after choking event with possible aspiration; though, likely also component of flash pulmonary edema   06/10/23: Needing HFNC and tachypenic and mildy paradoxical  PLAN - Continue supplemental O2 as needed to maintain SpO2 > 95% (racial pulse ox gap_ - BiPAP PRN only for increased WOB.  Possible aspiration (Tmax 99.51F and PCT 0.46 with normal WBC)  Plan  -start unasyn empiric  -track market    Chronic dysphagia with chronic abdominal pain/bloating. - Would recommend engaging GI again and consider repeat EGD to evaluate for further/worsened esophageal strictures/achalasia/etc as well as re-evaluate his known hiatal hernia. - Consider empiric Reglan to assist with motility though caution with QTc given his VT.   Hypertension - of GDMTx 6 months  Plan  - cards restating GDMT  AICD discharges due to recurrent VT at Admit.  in setting of VT felt to be 2/2 EtOH  and electrolyte derangements. - Per EP.     Chronic sCHF, NICM. - diagnosed 2006, sp barostim July 2021 Hx of medication non compliance - Per AHF.; RHC if stable  06/10/22 - continuo amio - farxiga  EtOH dependence/abuse. - etoh 104 at admit - Cessation counseling. - Thiamine/Folate/MV.   Hx of Dpression  Plan  - needs social work  Medical sales representative:  Diet: npo except meds Pain/Anxiety/Delirium protocol (if indicated): pecedex and CIWA VAP protocol (if indicated): n DVT prophylaxis: lovenoex 40mg  BID GI prophylaxis: ppi Glucose control: ssi Mobility: bed rest Code Status: ful code Family Communication: sister at bedside Disposition: move to ICU 2H      ATTESTATION & SIGNATURE   The patient Stanley [name] is critically ill with multiple organ systems failure and requires high complexity decision making for assessment and support, frequent evaluation and titration of therapies, application of advanced monitoring technologies and extensive interpretation of multiple databases and discussion with other appropriate health care personnel such as bedside nurses, social workers, case Production designer, theatre/television/film, consultants, respiratory therapists, nutritionists, secretaries etc.,  Critical care time includes but is not restricted to just documentation time. Documentation can happen in parallel or sequential to care time depending on case mix urgency and priorities for the shift. So,  overall critical Care Time devoted to patient care services described in this note is  45  Minutes.   This time reflects time of care of this signee Dr Kalman Shan which includ does not reflect procedure time, or teaching time or supervisory time of PA/NP/Med student/Med Resident etc but could involve care discussion time     Dr. Kalman Shan, M.D., [facility].C.P Pulmonary and Critical Care Medicine Staff Physician, [facility]  Pulmonary and Critical Care Pager: [phone], If no answer or between   15:00h - 7:00h: call [phone]  06/10/2022 12:31 AM    LABS    PULMONARY Recent Labs  Lab 06/08/22 1206 06/09/22 0750 06/09/22 2355  HCO3  --  20.4 21.4  TCO2 15*  --   --   O2SAT  --  100 96.1    CBC Recent Labs  Lab 06/08/22 1132 06/08/22 1206 06/09/22 0128 06/09/22 2355  HGB 11.9* 11.9* 13.0 13.7  HCT 35.2* 35.0* 37.9* 39.2  WBC 4.4  --  10.5 10.4  PLT 156  --  184 157    COAGULATION Recent Labs  Lab 06/09/22 0128  INR 1.0    CARDIAC  No results for input(s): "TROPONINI" in the last 168 hours. No results for input(s): "PROBNP" in the last 168 hours.   CHEMISTRY Recent Labs  Lab 06/08/22 1132 06/08/22 1206 06/08/22 1410 06/09/22 0128  NA 140 142  --  133*  K 3.1* 3.1*  --  4.1  CL 106 111  --  101  CO2 15*  --   --  18*  GLUCOSE 54* 50*  --  152*  BUN 15 14  --  11  CREATININE 1.12 1.00  --  1.13  CALCIUM 8.9  --   --  8.8*  MG  --   --  1.2* 1.8   Estimated Creatinine Clearance: 62 mL/min (by C-G formula based on SCr of 1.13 mg/dL).   LIVER Recent Labs  Lab 06/08/22 1326 06/09/22 0128  AST 312* 235*  ALT 174* 177*  ALKPHOS 72 102  BILITOT 1.6* 2.1*  PROT 6.1* 6.9  ALBUMIN 3.3* 3.6  INR  --  1.0     INFECTIOUS Recent Labs  Lab 06/09/22 0936  PROCALCITON 0.46     ENDOCRINE CBG (last 3)  Recent Labs    06/08/22 1304 06/08/22 1353 06/09/22 2301  GLUCAP 52* 84 171*         IMAGING x48h  - image(s) personally visualized  -   highlighted in bold ECHOCARDIOGRAM COMPLETE  Result Date: 06/09/2022    ECHOCARDIOGRAM REPORT   Patient Name:   BRETON ROMINE Date of Exam: 06/09/2022 Medical Rec #:  [id number]         Height:       65.0 in Accession #:    [id number]        Weight:       139.0 lb Date of Birth:  07-07-1965         BSA:          1.695 m Patient Age:    56 years          BP:           130/99 mmHg Patient Gender: M                 HR:           108 bpm. Exam Location:  Inpatient Procedure: 2D Echo, Cardiac  Doppler, Color Doppler and Intracardiac            Opacification Agent Indications:    Ventricular Tachycardia I47.2  History:        Patient has prior history of Echocardiogram examinations, most                 recent 12/30/2018. CHF and Cardiomyopathy, CAD, Defibrillator,                 ETOH abuse; Risk Factors:Hypertension, Former Smoker and Sleep                 Apnea.  Sonographer:    Aron Baba Referring Phys: [id number] PETER C MESSICK IMPRESSIONS  1. Left ventricular ejection fraction, by estimation, is <20%. The left ventricle has severely decreased function. The left ventricle demonstrates global hypokinesis. The left ventricular internal cavity size was mildly to moderately dilated. Left ventricular diastolic parameters are consistent with Grade I diastolic dysfunction (impaired relaxation).  2. Right ventricular systolic function is mildly reduced. The right ventricular size is normal. There is mildly elevated pulmonary artery systolic pressure. The estimated right ventricular systolic pressure is 44.5 mmHg.  3. Left atrial size was moderately dilated.  4. Right atrial size was mildly dilated.  5. The mitral valve is normal in structure. Moderate mitral valve regurgitation.  6. Tricuspid valve regurgitation is moderate to severe.  7. The aortic valve is normal in structure. Aortic valve regurgitation is not visualized.  8. The inferior vena cava is normal in size with <50% respiratory variability, suggesting right atrial pressure of 8 mmHg. FINDINGS  Left Ventricle: Left ventricular ejection fraction, by estimation, is <20%. The left ventricle has severely decreased function. The left ventricle demonstrates global hypokinesis. Definity contrast agent was given IV to delineate the left ventricular endocardial borders. The left ventricular internal cavity size was mildly to moderately dilated. There is no left ventricular hypertrophy. Left ventricular diastolic parameters are consistent with Grade I  diastolic dysfunction (impaired relaxation). Right Ventricle: The right ventricular size is normal. No increase in right ventricular wall thickness. Right ventricular systolic function is mildly reduced. There is mildly elevated pulmonary artery systolic pressure. The tricuspid regurgitant velocity  is 3.02 m/s, and with an assumed right atrial pressure of 8 mmHg, the estimated right ventricular systolic pressure is 44.5 mmHg. Left Atrium: Left atrial size was moderately dilated. Right Atrium: Right atrial size was mildly dilated. Pericardium: There is no evidence of pericardial effusion. Mitral Valve: The mitral valve is normal in structure. Moderate mitral valve regurgitation. Tricuspid Valve: The tricuspid valve is normal in structure. Tricuspid valve regurgitation is moderate to severe. Aortic Valve: The aortic valve is normal in structure. Aortic valve regurgitation is not visualized. Pulmonic Valve: The pulmonic valve was normal in structure. Pulmonic valve regurgitation is mild. Aorta: The aortic root is normal in size and structure. Venous: The inferior vena cava is normal in size with less than 50% respiratory variability, suggesting right atrial pressure of 8 mmHg. IAS/Shunts: No atrial level shunt detected by color flow Doppler. Additional Comments: A device lead is visualized.  LEFT VENTRICLE PLAX 2D LVIDd:         6.00 cm      Diastology LVIDs:         5.80 cm      LV e' medial:    10.50 cm/s LV PW:         1.10 cm      LV E/e' medial:  14.2 LV IVS:  0.90 cm      LV e' lateral:   8.06 cm/s LVOT diam:     2.00 cm      LV E/e' lateral: 18.5 LV SV:         35 LV SV Index:   21 LVOT Area:     3.14 cm  LV Volumes (MOD) LV vol d, MOD A2C: 240.0 ml LV vol d, MOD A4C: 164.0 ml LV vol s, MOD A2C: 222.0 ml LV vol s, MOD A4C: 130.0 ml LV SV MOD A2C:     18.0 ml LV SV MOD A4C:     164.0 ml LV SV MOD BP:      25.4 ml RIGHT VENTRICLE RV S prime:     12.10 cm/s TAPSE (M-mode): 1.1 cm LEFT ATRIUM           Index         RIGHT ATRIUM           Index LA diam:      4.60 cm 2.71 cm/m   RA Area:     16.60 cm LA Vol (A2C): 71.4 ml 42.13 ml/m  RA Volume:   46.30 ml  27.32 ml/m LA Vol (A4C): 66.6 ml 39.30 ml/m  AORTIC VALVE             PULMONIC VALVE LVOT Vmax:   79.30 cm/s  PR End Diast Vel: 13.69 msec LVOT Vmean:  52.000 cm/s LVOT VTI:    0.111 m  AORTA Ao Root diam: 2.80 cm Ao Asc diam:  3.20 cm MITRAL VALVE                 TRICUSPID VALVE MV Area (PHT): 7.66 cm      TR Peak grad:   36.5 mmHg MV Decel Time: 99 msec       TR Vmax:        302.00 cm/s MR Peak grad:   91.1 mmHg MR Mean grad:   62.0 mmHg    SHUNTS MR Vmax:        477.33 cm/s  Systemic VTI:  0.11 m MR Vmean:       369.0 cm/s   Systemic Diam: 2.00 cm MR PISA:        1.01 cm MR PISA Radius: 0.40 cm MV E velocity: 149.00 cm/s Aditya Sabharwal Electronically signed by Dorthula Nettles Signature Date/Time: 06/09/2022/2:30:42 PM    Final    DG Chest Port 1 View  Result Date: 06/09/2022 CLINICAL DATA:  Dyspnea. EXAM: PORTABLE CHEST 1 VIEW COMPARISON:  Radiograph yesterday FINDINGS: Development of extensive asymmetric right lung opacities with a slight perihilar distribution. There also new confluent opacities at the retrocardiac left lung base. Stable cardiomegaly with single lead pacemaker in place. Battery pack for a neural stimulator on the right. A small right pleural effusion with fluid in the fissures. No pneumothorax. IMPRESSION: 1. Development of diffuse right lung opacities and left basilar opacities that may represent asymmetric pulmonary edema or multifocal pneumonia. Pulmonary edema is favored given rapid development since yesterday. 2. Stable cardiomegaly. Electronically Signed   By: Narda Rutherford M.D.   On: 06/09/2022 10:53   US Abdomen Limited RUQ (LIVER/GB)  Result Date: 06/08/2022 CLINICAL DATA:  Abdominal fullness EXAM: ULTRASOUND ABDOMEN LIMITED RIGHT UPPER QUADRANT COMPARISON:  CT abdomen and pelvis dated 06/30/2021 FINDINGS: Gallbladder: No  wall thickening visualized. Cholelithiasis. No sonographic Murphy sign noted by sonographer. Common bile duct: Diameter: 5 mm Liver: Diffusely increased hepatic echogenicity can be seen in  the setting of infiltrative process such as steatosis. Portal vein is patent on color Doppler imaging with normal direction of blood flow towards the liver. Other: None. IMPRESSION: 1. Cholelithiasis without sonographic evidence of acute cholecystitis. 2. Diffusely increased hepatic echogenicity can be seen in the setting of infiltrative process such as steatosis. Electronically Signed   By: Agustin Cree M.D.   On: 06/08/2022 17:40   DG Chest Port 1 View  Result Date: 06/08/2022 CLINICAL DATA:  Shortness of breath EXAM: PORTABLE CHEST 1 VIEW COMPARISON:  X-ray 05/17/2021 FINDINGS: Left upper chest defibrillator with leads along the right side of the heart. Stable borderline size heart. Slight central vascular congestion. Hyperinflation. No consolidation, pneumothorax or effusion. Overlapping cardiac leads. Separate battery pack along the right upper chest with leads extending towards the right neck. IMPRESSION: Defibrillator. Borderline size heart with central vascular congestion. Electronically Signed   By: Karen Kays M.D.   On: 06/08/2022 14:34

## 2022-06-10 NOTE — Progress Notes (Addendum)
Advanced Heart Failure Rounding Note  PCP-Cardiologist: Arvilla Meres, MD   Subjective:    6/3: Admitted w/ ICD shocks for VT + intoxication and metabolic derangement (K 3.1, Mg 1.2)  6/4: Developed acute respiratory failure, acute pulmonary edema ? Aspiration PNA. Worsened overnight w/ hypotension, transferred to ICU and started on NEO. Amio gtt stopped   This morning, off BiPAP. Now on HHFNC, 40L/min. Altered. Oriented to person but not place or time. Agitated overnight, biting and pulling at IVs.   Cuff pressures w/ MAPs in the 50s this morning despite NEO. Now on NE peripherally.   No further VT on tele   K 4.5 Mg 3.4  Scr 1.14>>1.43>>1.94   PCT 0.46. On unasyn. AF. WBC pending   CIWA protocol   Objective:   Weight Range: 65.5 kg Body mass index is 24.03 kg/m.   Vital Signs:   Temp:  [97.4 F (36.3 C)-99.9 F (37.7 C)] 98 F (36.7 C) (06/05 0130) Pulse Rate:  [78-203] 78 (06/05 0600) Resp:  [18-40] 35 (06/05 0600) BP: (47-155)/(37-107) 52/37 (06/05 0600) SpO2:  [85 %-99 %] 93 % (06/05 0600) FiO2 (%):  [60 %-70 %] 62 % (06/05 0245) Weight:  [65.5 kg] 65.5 kg (06/05 0130) Last BM Date : 06/08/22  Weight change: Filed Weights   06/08/22 1759 06/09/22 0438 06/10/22 0130  Weight: 66.5 kg 63 kg 65.5 kg    Intake/Output:   Intake/Output Summary (Last 24 hours) at 06/10/2022 0805 Last data filed at 06/10/2022 0610 Gross per 24 hour  Intake 1240.02 ml  Output 2050 ml  Net -809.98 ml      Physical Exam    General:  alerted male, on HHFNC. No current agitation  HEENT: Normal Neck: Supple. Short thick neck, JVD to jaw Carotids 2+ bilat; no bruits. No lymphadenopathy or thyromegaly appreciated. Cor: PMI nondisplaced. Regular rate & rhythm. No rubs, gallops or murmurs. Lungs: decreased BS anteriorly  Abdomen: Soft, nontender, nondistended. No hepatosplenomegaly. No bruits or masses. Good bowel sounds. Extremities: No cyanosis, clubbing, rash,  edema Neuro: altered, only oriented to person. Not place or time.    Telemetry   NSR 84. No further VT   EKG    No new EKG to review   Labs    CBC Recent Labs    06/08/22 1132 06/08/22 1206 06/09/22 0128 06/09/22 2355  WBC 4.4  --  10.5 10.4  NEUTROABS 3.2  --   --   --   HGB 11.9*   < > 13.0 13.7  HCT 35.2*   < > 37.9* 39.2  MCV 105.4*  --  98.7 97.3  PLT 156  --  184 157   < > = values in this interval not displayed.   Basic Metabolic Panel Recent Labs    54/09/81 0128 06/09/22 2355 06/10/22 0552  NA 133* 133*  --   K 4.1 3.2*  --   CL 101 97*  --   CO2 18* 21*  --   GLUCOSE 152* 169*  --   BUN 11 10  --   CREATININE 1.13 1.43*  --   CALCIUM 8.8* 9.1  --   MG 1.8 1.6* 3.4*  PHOS  --   --  1.1*   Liver Function Tests Recent Labs    06/09/22 0128 06/10/22 0552  AST 235* 124*  ALT 177* 129*  ALKPHOS 102 82  BILITOT 2.1* 2.0*  PROT 6.9 6.3*  ALBUMIN 3.6 3.0*   Recent Labs  06/08/22 1326  LIPASE 60*   Cardiac Enzymes No results for input(s): "CKTOTAL", "CKMB", "CKMBINDEX", "TROPONINI" in the last 72 hours.  BNP: BNP (last 3 results) Recent Labs    06/10/22 0552  BNP 2,797.6*    ProBNP (last 3 results) No results for input(s): "PROBNP" in the last 8760 hours.   D-Dimer No results for input(s): "DDIMER" in the last 72 hours. Hemoglobin A1C Recent Labs    06/09/22 0936  HGBA1C 4.4*   Fasting Lipid Panel No results for input(s): "CHOL", "HDL", "LDLCALC", "TRIG", "CHOLHDL", "LDLDIRECT" in the last 72 hours. Thyroid Function Tests No results for input(s): "TSH", "T4TOTAL", "T3FREE", "THYROIDAB" in the last 72 hours.  Invalid input(s): "FREET3"  Other results:   Imaging    DG CHEST PORT 1 VIEW  Result Date: 06/10/2022 CLINICAL DATA:  Respiratory distress EXAM: PORTABLE CHEST 1 VIEW COMPARISON:  06/09/2022 FINDINGS: Defibrillator is again identified. Stimulator pack is seen on the right. Cardiac shadow is enlarged. Diffuse  airspace opacity is noted throughout the right lung although slightly improved when compared with the prior exam. Left lung remains clear. No bony abnormality is noted. IMPRESSION: Slight improved aeration in the right lung when compared with the previous day. Electronically Signed   By: Alcide Clever M.D.   On: 06/10/2022 00:47   ECHOCARDIOGRAM COMPLETE  Result Date: 06/09/2022    ECHOCARDIOGRAM REPORT   Patient Name:   Stanley [name] Date of Exam: 06/09/2022 Medical Rec #:  [id number]         Height:       65.0 in Accession #:    [id number]        Weight:       139.0 lb Date of Birth:  1965/09/08         BSA:          1.695 m Patient Age:    56 years          BP:           130/99 mmHg Patient Gender: M                 HR:           108 bpm. Exam Location:  Inpatient Procedure: 2D Echo, Cardiac Doppler, Color Doppler and Intracardiac            Opacification Agent Indications:    Ventricular Tachycardia I47.2  History:        Patient has prior history of Echocardiogram examinations, most                 recent 12/30/2018. CHF and Cardiomyopathy, CAD, Defibrillator,                 ETOH abuse; Risk Factors:Hypertension, Former Smoker and Sleep                 Apnea.  Sonographer:    Aron Baba Referring Phys: [id number] PETER C MESSICK IMPRESSIONS  1. Left ventricular ejection fraction, by estimation, is <20%. The left ventricle has severely decreased function. The left ventricle demonstrates global hypokinesis. The left ventricular internal cavity size was mildly to moderately dilated. Left ventricular diastolic parameters are consistent with Grade I diastolic dysfunction (impaired relaxation).  2. Right ventricular systolic function is mildly reduced. The right ventricular size is normal. There is mildly elevated pulmonary artery systolic pressure. The estimated right ventricular systolic pressure is 44.5 mmHg.  3. Left atrial size was moderately dilated.  4. Right atrial size was mildly  dilated.  5. The mitral  valve is normal in structure. Moderate mitral valve regurgitation.  6. Tricuspid valve regurgitation is moderate to severe.  7. The aortic valve is normal in structure. Aortic valve regurgitation is not visualized.  8. The inferior vena cava is normal in size with <50% respiratory variability, suggesting right atrial pressure of 8 mmHg. FINDINGS  Left Ventricle: Left ventricular ejection fraction, by estimation, is <20%. The left ventricle has severely decreased function. The left ventricle demonstrates global hypokinesis. Definity contrast agent was given IV to delineate the left ventricular endocardial borders. The left ventricular internal cavity size was mildly to moderately dilated. There is no left ventricular hypertrophy. Left ventricular diastolic parameters are consistent with Grade I diastolic dysfunction (impaired relaxation). Right Ventricle: The right ventricular size is normal. No increase in right ventricular wall thickness. Right ventricular systolic function is mildly reduced. There is mildly elevated pulmonary artery systolic pressure. The tricuspid regurgitant velocity  is 3.02 m/s, and with an assumed right atrial pressure of 8 mmHg, the estimated right ventricular systolic pressure is 44.5 mmHg. Left Atrium: Left atrial size was moderately dilated. Right Atrium: Right atrial size was mildly dilated. Pericardium: There is no evidence of pericardial effusion. Mitral Valve: The mitral valve is normal in structure. Moderate mitral valve regurgitation. Tricuspid Valve: The tricuspid valve is normal in structure. Tricuspid valve regurgitation is moderate to severe. Aortic Valve: The aortic valve is normal in structure. Aortic valve regurgitation is not visualized. Pulmonic Valve: The pulmonic valve was normal in structure. Pulmonic valve regurgitation is mild. Aorta: The aortic root is normal in size and structure. Venous: The inferior vena cava is normal in size with less than 50% respiratory  variability, suggesting right atrial pressure of 8 mmHg. IAS/Shunts: No atrial level shunt detected by color flow Doppler. Additional Comments: A device lead is visualized.  LEFT VENTRICLE PLAX 2D LVIDd:         6.00 cm      Diastology LVIDs:         5.80 cm      LV e' medial:    10.50 cm/s LV PW:         1.10 cm      LV E/e' medial:  14.2 LV IVS:        0.90 cm      LV e' lateral:   8.06 cm/s LVOT diam:     2.00 cm      LV E/e' lateral: 18.5 LV SV:         35 LV SV Index:   21 LVOT Area:     3.14 cm  LV Volumes (MOD) LV vol d, MOD A2C: 240.0 ml LV vol d, MOD A4C: 164.0 ml LV vol s, MOD A2C: 222.0 ml LV vol s, MOD A4C: 130.0 ml LV SV MOD A2C:     18.0 ml LV SV MOD A4C:     164.0 ml LV SV MOD BP:      25.4 ml RIGHT VENTRICLE RV S prime:     12.10 cm/s TAPSE (M-mode): 1.1 cm LEFT ATRIUM           Index        RIGHT ATRIUM           Index LA diam:      4.60 cm 2.71 cm/m   RA Area:     16.60 cm LA Vol (A2C): 71.4 ml 42.13 ml/m  RA Volume:   46.30 ml  27.32 ml/m LA  Vol (A4C): 66.6 ml 39.30 ml/m  AORTIC VALVE             PULMONIC VALVE LVOT Vmax:   79.30 cm/s  PR End Diast Vel: 13.69 msec LVOT Vmean:  52.000 cm/s LVOT VTI:    0.111 m  AORTA Ao Root diam: 2.80 cm Ao Asc diam:  3.20 cm MITRAL VALVE                 TRICUSPID VALVE MV Area (PHT): 7.66 cm      TR Peak grad:   36.5 mmHg MV Decel Time: 99 msec       TR Vmax:        302.00 cm/s MR Peak grad:   91.1 mmHg MR Mean grad:   62.0 mmHg    SHUNTS MR Vmax:        477.33 cm/s  Systemic VTI:  0.11 m MR Vmean:       369.0 cm/s   Systemic Diam: 2.00 cm MR PISA:        1.01 cm MR PISA Radius: 0.40 cm MV E velocity: 149.00 cm/s Aditya Sabharwal Electronically signed by Dorthula Nettles Signature Date/Time: 06/09/2022/2:30:42 PM    Final    DG Chest Port 1 View  Result Date: 06/09/2022 CLINICAL DATA:  Dyspnea. EXAM: PORTABLE CHEST 1 VIEW COMPARISON:  Radiograph yesterday FINDINGS: Development of extensive asymmetric right lung opacities with a slight perihilar  distribution. There also new confluent opacities at the retrocardiac left lung base. Stable cardiomegaly with single lead pacemaker in place. Battery pack for a neural stimulator on the right. A small right pleural effusion with fluid in the fissures. No pneumothorax. IMPRESSION: 1. Development of diffuse right lung opacities and left basilar opacities that may represent asymmetric pulmonary edema or multifocal pneumonia. Pulmonary edema is favored given rapid development since yesterday. 2. Stable cardiomegaly. Electronically Signed   By: Narda Rutherford M.D.   On: 06/09/2022 10:53     Medications:     Scheduled Medications:  aspirin  81 mg Oral Pre-Cath   atorvastatin  20 mg Oral Daily   Chlorhexidine Gluconate Cloth  6 each Topical Daily   dapagliflozin propanediol  10 mg Oral Daily   enoxaparin (LOVENOX) injection  40 mg Subcutaneous Q24H   folic acid  1 mg Oral Daily   furosemide  80 mg Intravenous TID   isosorbide-hydrALAZINE  0.5 tablet Oral TID   lidocaine  1 patch Transdermal Q24H   LORazepam       losartan  25 mg Oral Daily   multivitamin with minerals  1 tablet Oral Daily   mupirocin ointment  1 Application Nasal BID   pantoprazole  40 mg Oral Daily   PARoxetine  20 mg Oral Daily   sodium chloride flush  3 mL Intravenous Q12H   spironolactone  12.5 mg Oral Daily   thiamine  100 mg Intravenous Daily   thiamine  100 mg Oral Daily   traZODone  150 mg Oral QHS    Infusions:  sodium chloride Stopped (06/10/22 0531)   sodium chloride 10 mL/hr at 06/10/22 0600   sodium chloride     amiodarone 30 mg/hr (06/10/22 0600)   ampicillin-sulbactam (UNASYN) IV Stopped (06/10/22 0518)   dexmedetomidine     dexmedetomidine (PRECEDEX) IV infusion Stopped (06/10/22 0459)   phenylephrine (NEO-SYNEPHRINE) Adult infusion 25 mcg/min (06/10/22 9604)   potassium chloride 10 mEq (06/10/22 0638)   sodium phosphate 30 mmol in dextrose 5 % 250 mL infusion  PRN Medications: sodium  chloride, alum & mag hydroxide-simeth, dexmedetomidine, guaiFENesin, LORazepam, LORazepam, LORazepam, prochlorperazine, sodium chloride flush    Patient Profile   Jayel Fladung is a 57 y.o. male with systolic HF due to NICM d/x'd in 2006 (felt 2/2 HTN/ETOH), ? CAD s/p PCI to unknown vessel 2011 at outside location (cath 2014 without CAD and normal coronaries by CT 07/2016), VT s/p [facility] ICD implant ([place] 1610), noncompliance, chronic noncardiac chest pain, depression, HTN and ETOH abuse.  Admitted with VT with ICD shocks in the setting of acute decompensated HF, med noncompliance, ETOH intoxication and electrolyte derangements. AHF to see for acute on chronic systolic heart failure. Also w/ possible aspiration PNA.   Assessment/Plan   1. VT: Shocks from [employer] ICD, now in NSR on amiodarone gtt. No prior chest pain, prior coronary workups have shown no significant coronary disease. HS-TnI mildly elevated with no trend, suspect demand ischemia from volume overload and VT arrest.  Suspect arrest was due to underlying NICM, off meds, with hypokalemia and worsening CHF.  No further VT on tele. Amio gtt stopped overnight due to hypotension and bradycardia.  - EP following  - Keep K > 4.0 and Mg > 2.0  2. Acute on chronic systolic CHF: Patient with history of NICM, last coronary evaluation was CTA in 2018 with normal coronaries and calcium score 0.  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  Most recent prior echo in 2021 with EF 25-30%.  Echo this admission with EF < 20%, mild RV dysfunction, moderate-severe TR, moderate MR. Developed acute pulmonary edema yesterday and diuresed w/ IV Lasix. Now w/ hypotension requiring pressor support and AKI, SCr bumped 1.13>>1.94 - Needs central access. D/w CCM at bedside. Will place CVC and check Co-ox and CVP monitoring  - Stop phenylephrine and use NE support. Keep MAP > 65. Will need a-line - If additional support needed can add VP to limit risk of further  VT  - Hold Losartan, Bidil, Jardiance and Metoprolol  - Will need RHC to assess filling pressures and cardiac output, can do this tomorrow (defer today given agitation) - Not candidate for advanced therapies at this time with heavy ETOH abuse.  3. ETOH abuse: Intoxicated at admission. Heavy ETOH use, likely contributes to cardiomyopathy.  - CIWA protocol.  4. Suspected Aspiration PNA/ ? Septic Shock w/ New Pressor Requirements: PCT 0.46. Started on Unasyn  - stop Unasyn given HF/ sodium load. Switch to ceftriaxone and flagyl.  - check CBC and follow PCT  5. AKI: SCr bumped 1.13>>1.94. Suspect due to hypotension +/- cardiorenal  - continue NE support  - place CVC and follow co-ox  - follow BMP    Length of Stay: 1  Brittainy Simmons, PA-C  06/10/2022, 8:05 AM  Advanced Heart Failure Team Pager [phone] (M-F; 7a - 5p)  Please contact [facility] for night-coverage after hours (5p -7a ) and weekends on [email or web address]  Patient seen with PA, agree with the above note.   Severe ETOH withdrawal overnight, transferred to CCU.  Suspect aspiration PNA with diffuse airspace opacity on the right.  He became hypotensive and was started on peripheral phenylephrine, amiodarone was stopped.  No further VT. Creatinine up to 1.94.   He is awake but drowsy, confused.  HR improved in 80s.   General: NAD Neck: JVP 12 cm, no thyromegaly or thyroid nodule.  Lungs: Decreased at bases.  CV: Nondisplaced PMI.  Heart regular S1/S2, no S3/S4, 2/6 HSM LLSB/apex.  No peripheral edema.  Abdomen: Soft, nontender, no hepatosplenomegaly, no distention.  Skin: Intact without lesions or rashes.  Neurologic: Drowsy, confused.   Psych: Normal affect. Extremities: No clubbing or cyanosis.  HEENT: Normal.   Severe ETOH withdrawal, confused this morning but HR lower.  CIWA protocol, management per CCM.   On Unasyn for aspiration PNA, will transition to ceftriaxone/Flagyl to avoid sodium load. CXR with right-sided  infiltrate. PCT 0.46.   Hypotensive, ?cardiogenic shock in setting of severe ETOH withdrawal.  - Would wean off phenylephrine and onto norepinephrine for now.  - Needs arterial line and central line. Will follow CBC and co-ox.   - If co-ox low, would start dobutamine.  - Will need diuresis when BP is stabilized and lines in.  - check lactate - With severe ETOH withdrawal and confusion, will hold off on RHC (consider tomorrow).   Would restart amiodarone gtt at 30 mg/hr for VT.   CRITICAL CARE Performed by: Marca Ancona  Total critical care time: 40 minutes  Critical care time was exclusive of separately billable procedures and treating other patients.  Critical care was necessary to treat or prevent imminent or life-threatening deterioration.  Critical care was time spent personally by me on the following activities: development of treatment plan with patient and/or surrogate as well as nursing, discussions with consultants, evaluation of patient's response to treatment, examination of patient, obtaining history from patient or surrogate, ordering and performing treatments and interventions, ordering and review of laboratory studies, ordering and review of radiographic studies, pulse oximetry and re-evaluation of patient's condition.  Marca Ancona 06/10/2022 8:57 AM

## 2022-06-10 NOTE — Hospital Course (Addendum)
57 y.o. male with a history of alcohol use disorder, CAD, HFrEF, depression, gout, hypertension, nonischemic cardiomyopathy, V. tach with ICD in place admitted for multiple ICD discharges at home and recent heavy EtOH use.   AICD discharge  3 ICD shocks at home. Likely due to medication noncompliance, alcohol intoxication and associated electrolyte derangements. Started on IV amiodarone by EP, transitioned to oral on ***. Troponin elevated to 229 and 304 likely in setting of recent ICD discharges.  ETOH abuse  Delirium Tremens Significant ETOH use-drinks about 2 shots of liquor an hour. Alcohol level of 104 on admit. Was started on CIWA protocol with ativan.  On night of 6/4 patient became agitated, altered and oriented only to self and very restless.  CIWA scores of greater than 20 and not responding to ativan.  Critical care was consulted for Precedex drip and patient was transferred to ICU 6/4, this was weaned off on 6/5.  Developed significant hypotension and required pressor support discussed below.  Social resources given at discharge***.  Acute hypoxic respiratory failure On morning of 6/4 patient had oxygen desaturation to 79. Was placed on 14 L HFNC and still was at low to mid 80s.  Was switched to BiPAP for work of breathing.  Patient says that this was after a possible aspiration event where patient choked on a banana while eating this. Obtained stat chest x-ray which showed diffuse right lung opacities and left basilar opacities suggesting flash pulmonary edema from CHF versus aspiration pneumonitis/pneumonia.   Seen by pulmonology after this event and recommended heated high flow (required up to 40 L) and BiPAP as needed.  Started on empiric Unasyn on 6/4 in the ICU.  This was switched to ceftriaxone and flagyl on 6/5-***. Was on room air ***.  HFrEF Echo with EF <20%. Heart failure team started patient on IV diuresis and recommended right heart cath for assessing filling pressures and  cardiac output. This was performed 6/5 and showed normal PCWP with elevated RA pressure + mild PAH. GDMT***  Cardiogenic shock Significant hypotension after initiating precedex. Required phenylephrine (6/5) and switched to levophed (6/5-6/7) and dobutamine 6/6-***.  AKI  Elevations up to 1.94*** thought to be related to cardiorenal syndrome. Cr at discharge was ***  Chronic dysphagia with chronic abdominal pain/bloating  Has chronic dysphagia to both solids and liquids and that he has been evaluated by GI in the past along with abdominal pain and bloating. Prior EGD showed in 2023 showed 1 cm hiatal hernia and he had empiric esophageal dilation using a savary dilator with mild resistance at 17mm. GI consult? ***  Significant procedures:  6/5 Central Venous Catheter Insertion; Arterial Catheter Insertion Procedure Note   PCP Recommendations: 1.

## 2022-06-10 NOTE — Progress Notes (Addendum)
SLP Cancellation Note  Patient Details Name: Stanley [name] MRN: [id number] DOB: Mar 25, 1965   Cancelled treatment:       Reason Eval/Treat Not Completed: Patient's level of consciousness. Patient in bed in four-point restraints and has been lethargic and confused today. RN reported she was able to give him some meds earlier. SLP suspects that patient's incident with choking on banana yesterday morning impacted by his overall alertness and as patient is currently less alert, recommend continue NPO status and give only necessary oral meds if patient able to be adequately awake. SLP will plan to f/u next date after patient fully awake and alert following cardiac cath procedure.  Angela Nevin, MA, CCC-SLP Speech Therapy

## 2022-06-10 NOTE — Procedures (Signed)
Arterial Catheter Insertion Procedure Note  Makoy Wyer  [id number]  07-Jul-1965  Date:06/10/22  Time:11:42 AM    Provider Performing: Rutherford Guys    Procedure: Insertion of Arterial Line (40981) with US guidance ([id number])   Indication(s) Blood pressure monitoring and/or need for frequent ABGs  Consent Risks of the procedure as well as the alternatives and risks of each were explained to the patient and/or caregiver.  Consent for the procedure was obtained and is signed in the bedside chart  Anesthesia None   Time Out Verified patient identification, verified procedure, site/side was marked, verified correct patient position, special equipment/implants available, medications/allergies/relevant history reviewed, required imaging and test results available.   Sterile Technique Maximal sterile technique including full sterile barrier drape, hand hygiene, sterile gown, sterile gloves, mask, hair covering, sterile ultrasound probe cover (if used).   Procedure Description Area of catheter insertion was cleaned with chlorhexidine and draped in sterile fashion. With real-time ultrasound guidance an arterial catheter was placed into the right femoral artery.  Appropriate arterial tracings confirmed on monitor.     Complications/Tolerance None; patient tolerated the procedure well.   EBL Minimal   Specimen(s) None   Rutherford Guys, PA - [facility] For pager details, please see AMION or use Epic chat  After 1900, please call [facility] for cross coverage needs 06/10/2022, 11:42 AM

## 2022-06-10 NOTE — Progress Notes (Signed)
  Events from overnight noted.   No further VT  Pt had further decompensation felt in setting of severe withdrawal with DTS with agitation and CIWA >20 despite Ativan.   Tmax 99.9. Started on Unasyn for possible aspiration with coughing episode while eating.   HRs much better this am. CXR with R lung space somewhat improved. Pending repeat this am.   Pending RHC.  Would continue IV amiodarone at least through cath.  Consider po amio tomorrow if rhythm remains stable.   Casimiro Needle [address]" [place], PA-C  06/10/2022 7:15 AM

## 2022-06-10 NOTE — Progress Notes (Signed)
eLink Physician-Brief Progress Note Patient Name: Stanley [name] DOB: 1965/11/23 MRN: [id number]   Date of Service  06/10/2022  HPI/Events of Note  Patient transferred to the ICU due to severe ETOH withdrawal.  eICU Interventions  PRN iv Ativan Q 4 hours added, ceiling on Precedex increased to 1.5, ambient light reduced to limit stimulation, patient will need a second iv started, 4 extremity soft restraints ordered for patient safety (extremely high fall risk).        Zylan Almquist U Briannie Gutierrez 06/10/2022, 2:09 AM

## 2022-06-10 NOTE — Progress Notes (Signed)
NAME:  Stanley [name], MRN:  [id number], DOB:  1965/07/26, LOS: 1 ADMISSION DATE:  06/08/2022, CONSULTATION DATE:  06/09/22 REFERRING MD:  Pollie Meyer CHIEF COMPLAINT:  Dyspnea   History of Present Illness:  Stanley [name] is a 57 y.o. male who has a PMH as below. He was admitted 6/3 after ICD discharged multiple times at his home. He has both ICD and Barostim devices in place. He was seen by EP and his ICD discharges were found to be 2/2 VT presumed in the setting of medication non-compliance, EtOH intoxication and associated electrolyte derangements.  He was started on Amiodarone and was admitted for further workup. He is being seen by EP as well as AHF.  AM 6/4, he apparently choked on a banana and shortly thereafter, had an episode of desaturation and possibly increased WOB. His O2 via [place] was turned up but did not improve his hypoxia; therefore, he was placed on BiPAP. CXR was obtained and revealed pulmonary edema R > L for which he received Lasix. There is also some concern for possible aspiration.  PCCM called in consultation AM 6/4 for further recs. At the time of our evaluation, pt was on BiPAP and asking why he was still on it. ABG from earlier this morning showed respiratory alkalosis (7.47/28/184).  He tells [place] that he has chronic dysphagia to both solids and liquids and that he has been evaluated by GI in the past along with abdominal pain and bloating. Review of his chart shows that he saw GI in 2022  and had EGD 03/06/21 that demonstrated a 1cm hiatal hernia and he had empiric esophageal dilation using a savary dilator with mild resistance at 17mm. He was started on Zofran and his Protonix was increased from QD to BID dosing. 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 There was no reason to explain his bleeding.   Echo 2018: ef 35-40% 2020: 25-30% 2021; 25-30% 56/4/24: < 25%  Pertinent  Medical  History:  has Nonischemic cardiomyopathy (HCC); Chronic systolic dysfunction of left ventricle; Essential hypertension; Gout; Implantable cardioverter-defibrillator (ICD) in situ; ETOH abuse; AKI (acute kidney injury) (HCC); History of noncompliance with medical treatment, presenting hazards to health; Ventricular tachycardia (HCC); Obstructive sleep apnea; Chronic systolic heart failure (HCC); Moderate episode of recurrent major depressive disorder (HCC); CAD (coronary artery disease); Congestive heart failure (CHF) (HCC); Gastroesophageal reflux disease; Colon cancer screening; Benign neoplasm of colon; Abdominal pain, epigastric; Early satiety; Dysphagia; Hematemesis; ABLA (acute blood loss anemia); Diverticulosis of colon without hemorrhage; Family history of breast cancer; Family history of pancreatic cancer; Genetic testing; AICD discharge; Abdominal pain; Anxiety and depression; Acute hypoxic respiratory failure (HCC); and Acute on chronic combined systolic and diastolic CHF (congestive heart failure) (HCC) on their problem list.  Significant Hospital Events: Including procedures, antibiotic start and stop dates in addition to other pertinent events   6/3 admit 6/4 PCCM consult - BiPAP removed, placed on [place] and have asked RT to start HHFNC. He is answering questions appropriately. Says his breathing feels ok. Has ongoing abdominal discomfort but says this is chronic. Admits to chronic dysphagia and admits to choking on a piece of banana earlier. He does not feel that he aspirated and says he was able to cough it up pretty easily.  Interim History / Subjective:    06/10/22 early hours: called by [facility] residents to evlatue ongoing respiratory failure HFNC need and tachypnea despite significant lasix.  And ongoing DTS with agitation CIWA > 20 despite Ativan.CCM transffered him to ICU and took over as primary.  RHC pending later for 06/10/22  Objective:  Blood pressure (!) 52/37, pulse 78, temperature 98  F (36.7 C), temperature source Axillary, resp. rate (!) 35, height 5\' 5"  (1.651 m), weight 65.5 kg, SpO2 93 %.    FiO2 (%):  [60 %-70 %] 62 %   Intake/Output Summary (Last 24 hours) at 06/10/2022 0833 Last data filed at 06/10/2022 0610 Gross per 24 hour  Intake 1240.02 ml  Output 2050 ml  Net -809.98 ml    Filed Weights   06/08/22 1759 06/09/22 0438 06/10/22 0130  Weight: 66.5 kg 63 kg 65.5 kg    Examination: General: ill appearing man lying in bed in NAD Neuro: arouses to verbal stimulation. moving all extremities, confused HEENT: [place]/AT, eyes anicteric Cardiovascular: s1s2, RRR Lungs: rhales bilaterally, abel to lie flat, no accessory muscle use Abdomen: soft, NT  Musculoskeletal: no peripheral edema, no cyanosis Skin: warm, dry, no rashes  Na+ 132 BUN 15 Cr 1.94 AST 124 ALT 129 T bili 1.4 BNP 2797.6  Resolved probelms:    Assessment & Plan:   Acute on chronic HFrEF, cardiogenic shock VT causing AICD discharges at admission- felt to be 2/2 EtOH and electrolyte derangements. -needs CVC; switching to NE -hold Bblocker while on inotrope -amiodarone -farxiga -hold off on RHC today due to confusion and severe respiratory failure -appreciate EP's management -correct electrolytes  Hypophosphatemia -aggressive repletion -recheck later  Acute hypoxic respiratory failure - presumed after choking event with possible aspiration; though, likely also component of flash pulmonary edema  Likely aspiration pneumonia vs pneumonitis  -ceftriaxone & flagyl -HHFNC -needs diuresis -pulmonary hygiene  Acute metabolic encephalopathy likely 2/2 ETOH withdrawal -precedex gtt -CIWA  Chronic dysphagia with chronic abdominal pain/bloating. - Would recommend engaging GI again and consider repeat EGD to evaluate for further/worsened esophageal strictures/achalasia/etc as well as re-evaluate his known hiatal hernia. - Consider empiric Reglan to assist with motility though caution  with QTc given his VT.  AKI Hypervolemic hyponatremia -strict I/O -maintain adequate perfusion -monitor  Hyperbilirubinemia and transaminase elevation- likely due to hypoperfusion and congestion -monitor  History of hypertension; currently hypotensive -hold antihypertensives -switch neosynephrine to norepi  EtOH dependence  -will counsel on the need for quitting when appropriate -vitamins  Hx of Depression -con't paxil  Best practice:  Diet: npo except meds Pain/Anxiety/Delirium protocol (if indicated): precedex and CIWA VAP protocol (if indicated): n/a DVT prophylaxis: Lovenox 40mg  BID GI prophylaxis: ppi Glucose control: ssi Mobility: bed rest Code Status: full code Family Communication: no family at bedside Disposition: ICU     LABS     CBC Recent Labs  Lab 06/08/22 1132 06/08/22 1206 06/09/22 0128 06/09/22 2355  HGB 11.9* 11.9* 13.0 13.7  HCT 35.2* 35.0* 37.9* 39.2  WBC 4.4  --  10.5 10.4  PLT 156  --  184 157     This patient is critically ill with multiple organ system failure which requires frequent high complexity decision making, assessment, support, evaluation, and titration of therapies. This was completed through the application of advanced monitoring technologies and extensive interpretation of multiple databases. During this encounter critical care time was devoted to patient care services described in this note for 35 minutes.  Steffanie Dunn, DO 06/10/22 8:47 AM [facility]  For contact information, see Amion. If no response to pager, please call PCCM consult pager. After hours, 7PM- 7AM, please  call [facility].

## 2022-06-10 NOTE — Progress Notes (Signed)
   06/09/22 2345  BiPAP/CPAP/SIPAP  Reason BIPAP/CPAP not in use Other(comment) (Mittens on hands)   Patient currently has on mittens and is agitated in bed. Bipap is at bedside and he is currently on 60% 40L HFNC and satting 98%, will continue to monitor.

## 2022-06-10 NOTE — Progress Notes (Signed)
1900- Received pt from day RN, was told he was becoming increasingly restless and occasionally confused throughout the day, but was pleasant and redirectable. Family very supportive of pt.  1950- pt remains pleasant but believes he is at home, not easily redirectable, insists that he needs to go do something somewhere, story is different every time, continually removed oxygen. Mitts placed. CIWA= 20.  2101- Pt did not seem affected by first dose, continually swinging legs out of bed setting alarm off, CIWA=21. Administered more po Ativan per order. MD paged, pt disconnected amiodarone filter from tubing, new IV needed and obtained.  2156- Orders for STAT labs placed. [facility] down, called lab to ensure order was seen, no response, MD notified.  2249- Pt unable to be redirected, verbally abusive to family member in room, pulling at lines and oxygen, trying to get out of bed. Restraints placed per order.  2334- While restrained, pt slid far enough down in bed to set alarm off despite efforts to calm and redirect. During readjustment of restraints pt got mitt into his mouth and pulled. Verbal and physical redirection ineffective. He also bit and pulled the IV in his wrist beneath it, removing the IV. Finally able to get in touch with lab about drawing STAT orders. CIWA=28, IV ativan given per order.

## 2022-06-10 NOTE — Progress Notes (Signed)
PT Cancellation Note  Patient Details Name: Sebastien Radford MRN: [id number] DOB: 10-25-1965   Cancelled Treatment:    Reason Eval/Treat Not Completed: Active bedrest order. Transfer from 3E to [facility] ICU overnight due to severe ETOH withdrawal, hypotensive.    Ilda Foil 06/10/2022, 7:10 AM

## 2022-06-11 ENCOUNTER — Encounter (HOSPITAL_COMMUNITY)
Admission: EM | Disposition: A | Discharge status: Home Health | Payer: Self-pay | Source: Home / Self Care | Attending: Critical Care Medicine

## 2022-06-11 DIAGNOSIS — J690 Pneumonitis due to inhalation of food and vomit: Secondary | ICD-10-CM

## 2022-06-11 DIAGNOSIS — E876 Hypokalemia: Secondary | ICD-10-CM

## 2022-06-11 DIAGNOSIS — F10920 Alcohol use, unspecified with intoxication, uncomplicated: Secondary | ICD-10-CM

## 2022-06-11 HISTORY — PX: RIGHT HEART CATH: CATH118263

## 2022-06-11 LAB — BASIC METABOLIC PANEL
Anion gap: 10 (ref 5–15)
BUN: 19 mg/dL (ref 6–20)
CO2: 23 mmol/L (ref 22–32)
Calcium: 8.2 mg/dL — ABNORMAL LOW (ref 8.9–10.3)
Chloride: 101 mmol/L (ref 98–111)
Creatinine, Ser: 1.78 mg/dL — ABNORMAL HIGH (ref 0.61–1.24)
GFR, Estimated: 44 mL/min — ABNORMAL LOW (ref >60)
Glucose, Bld: 130 mg/dL — ABNORMAL HIGH (ref 70–99)
Potassium: 3.2 mmol/L — ABNORMAL LOW (ref 3.5–5.1)
Sodium: 134 mmol/L — ABNORMAL LOW (ref 135–145)

## 2022-06-11 LAB — CBC
HCT: 36.2 % — ABNORMAL LOW (ref 39.0–52.0)
HCT: 38.2 % — ABNORMAL LOW (ref 39.0–52.0)
Hemoglobin: 12.4 g/dL — ABNORMAL LOW (ref 13.0–17.0)
Hemoglobin: 12.6 g/dL — ABNORMAL LOW (ref 13.0–17.0)
MCH: 34.9 pg — ABNORMAL HIGH (ref 26.0–34.0)
MCH: 33.9 pg (ref 26.0–34.0)
MCHC: 34.3 g/dL (ref 30.0–36.0)
MCHC: 33 g/dL (ref 30.0–36.0)
MCV: 102 fL — ABNORMAL HIGH (ref 80.0–100.0)
MCV: 102.7 fL — ABNORMAL HIGH (ref 80.0–100.0)
Platelets: 163 10*3/uL (ref 150–400)
Platelets: 161 10*3/uL (ref 150–400)
RBC: 3.55 MIL/uL — ABNORMAL LOW (ref 4.22–5.81)
RBC: 3.72 MIL/uL — ABNORMAL LOW (ref 4.22–5.81)
RDW: 12.3 % (ref 11.5–15.5)
RDW: 12.4 % (ref 11.5–15.5)
WBC: 12.4 10*3/uL — ABNORMAL HIGH (ref 4.0–10.5)
WBC: 10 10*3/uL (ref 4.0–10.5)
nRBC: 0 % (ref 0.0–0.2)
nRBC: 0 % (ref 0.0–0.2)

## 2022-06-11 LAB — POCT I-STAT EG7
Acid-Base Excess: 0 mmol/L (ref 0.0–2.0)
Acid-Base Excess: 1 mmol/L (ref 0.0–2.0)
Bicarbonate: 26.3 mmol/L (ref 20.0–28.0)
Bicarbonate: 27 mmol/L (ref 20.0–28.0)
Calcium, Ion: 1.12 mmol/L — ABNORMAL LOW (ref 1.15–1.40)
Calcium, Ion: 1.15 mmol/L (ref 1.15–1.40)
HCT: 38 % — ABNORMAL LOW (ref 39.0–52.0)
HCT: 39 % (ref 39.0–52.0)
Hemoglobin: 12.9 g/dL — ABNORMAL LOW (ref 13.0–17.0)
Hemoglobin: 13.3 g/dL (ref 13.0–17.0)
O2 Saturation: 64 %
O2 Saturation: 66 %
Potassium: 4.2 mmol/L (ref 3.5–5.1)
Potassium: 4.2 mmol/L (ref 3.5–5.1)
Sodium: 136 mmol/L (ref 135–145)
Sodium: 137 mmol/L (ref 135–145)
TCO2: 28 mmol/L (ref 22–32)
TCO2: 28 mmol/L (ref 22–32)
pCO2, Ven: 48.3 mmHg (ref 44–60)
pCO2, Ven: 48.9 mmHg (ref 44–60)
pH, Ven: 7.344 (ref 7.25–7.43)
pH, Ven: 7.35 (ref 7.25–7.43)
pO2, Ven: 35 mmHg (ref 32–45)
pO2, Ven: 36 mmHg (ref 32–45)

## 2022-06-11 LAB — COOXEMETRY PANEL
Carboxyhemoglobin: 1.9 % — ABNORMAL HIGH (ref 0.5–1.5)
Methemoglobin: <0.7 % (ref 0.0–1.5)
O2 Saturation: 67.5 %
Total hemoglobin: 12.6 g/dL (ref 12.0–16.0)

## 2022-06-11 LAB — CREATININE, SERUM
Creatinine, Ser: 1.68 mg/dL — ABNORMAL HIGH (ref 0.61–1.24)
GFR, Estimated: 47 mL/min — ABNORMAL LOW (ref >60)

## 2022-06-11 LAB — MAGNESIUM: Magnesium: 2.9 mg/dL — ABNORMAL HIGH (ref 1.7–2.4)

## 2022-06-11 LAB — PROCALCITONIN: Procalcitonin: 1.95 ng/mL

## 2022-06-11 LAB — PHOSPHORUS: Phosphorus: 3.8 mg/dL (ref 2.5–4.6)

## 2022-06-11 SURGERY — RIGHT HEART CATH
Anesthesia: LOCAL

## 2022-06-11 MED ORDER — NOREPINEPHRINE 16 MG/250ML-% IV SOLN
0.0000 ug/min | INTRAVENOUS | Status: DC
  Administered 2022-06-11: 2 ug/min via INTRAVENOUS
  Administered 2022-06-11: 6 ug/min via INTRAVENOUS
  Filled 2022-06-11 (×2): qty 250

## 2022-06-11 MED ORDER — ACETAMINOPHEN 325 MG PO TABS
650.0000 mg | ORAL_TABLET | ORAL | Status: DC | PRN
  Administered 2022-06-12: 650 mg via ORAL
  Filled 2022-06-11: qty 2

## 2022-06-11 MED ORDER — ASPIRIN 81 MG PO CHEW
81.0000 mg | CHEWABLE_TABLET | ORAL | Status: AC
  Administered 2022-06-11: 81 mg via ORAL

## 2022-06-11 MED ORDER — FENTANYL CITRATE (PF) 100 MCG/2ML IJ SOLN
INTRAMUSCULAR | Status: AC
  Filled 2022-06-11: qty 2

## 2022-06-11 MED ORDER — DOBUTAMINE-DEXTROSE 4-5 MG/ML-% IV SOLN
2.5000 ug/kg/min | INTRAVENOUS | Status: DC
  Administered 2022-06-11: 2.5 ug/kg/min via INTRAVENOUS
  Filled 2022-06-11 (×3): qty 250

## 2022-06-11 MED ORDER — MIDAZOLAM HCL 2 MG/2ML IJ SOLN
INTRAMUSCULAR | Status: AC
  Filled 2022-06-11: qty 2

## 2022-06-11 MED ORDER — POTASSIUM CHLORIDE CRYS ER 20 MEQ PO TBCR
40.0000 meq | EXTENDED_RELEASE_TABLET | Freq: Once | ORAL | Status: AC
  Administered 2022-06-11: 40 meq via ORAL
  Filled 2022-06-11: qty 2

## 2022-06-11 MED ORDER — MIDAZOLAM HCL 2 MG/2ML IJ SOLN
INTRAMUSCULAR | Status: DC | PRN
  Administered 2022-06-11: .5 mg via INTRAVENOUS

## 2022-06-11 MED ORDER — FUROSEMIDE 10 MG/ML IJ SOLN
80.0000 mg | Freq: Once | INTRAMUSCULAR | Status: AC
  Administered 2022-06-11: 80 mg via INTRAVENOUS
  Filled 2022-06-11: qty 8

## 2022-06-11 MED ORDER — SODIUM CHLORIDE 0.9% FLUSH
3.0000 mL | Freq: Two times a day (BID) | INTRAVENOUS | Status: DC
  Administered 2022-06-12 – 2022-06-15 (×6): 3 mL via INTRAVENOUS

## 2022-06-11 MED ORDER — POTASSIUM CHLORIDE CRYS ER 20 MEQ PO TBCR
40.0000 meq | EXTENDED_RELEASE_TABLET | Freq: Once | ORAL | Status: DC
  Filled 2022-06-11: qty 2

## 2022-06-11 MED ORDER — SODIUM CHLORIDE 0.9% FLUSH: 3.0000 mL | INTRAVENOUS | Status: DC | PRN

## 2022-06-11 MED ORDER — ONDANSETRON HCL 4 MG/2ML IJ SOLN: 4.0000 mg | Freq: Four times a day (QID) | INTRAMUSCULAR | Status: DC | PRN

## 2022-06-11 MED ORDER — ASPIRIN 81 MG PO CHEW: 81.0000 mg | CHEWABLE_TABLET | ORAL | Status: DC

## 2022-06-11 MED ORDER — ENOXAPARIN SODIUM 40 MG/0.4ML IJ SOSY
40.0000 mg | PREFILLED_SYRINGE | INTRAMUSCULAR | Status: DC
  Administered 2022-06-12 – 2022-06-16 (×5): 40 mg via SUBCUTANEOUS
  Filled 2022-06-11 (×5): qty 0.4

## 2022-06-11 MED ORDER — POTASSIUM CHLORIDE 10 MEQ/50ML IV SOLN
10.0000 meq | INTRAVENOUS | Status: AC
  Administered 2022-06-11 (×3): 10 meq via INTRAVENOUS
  Filled 2022-06-11 (×3): qty 50

## 2022-06-11 MED ORDER — LIDOCAINE HCL (PF) 1 % IJ SOLN
INTRAMUSCULAR | Status: DC | PRN
  Administered 2022-06-11: 2 mL
  Administered 2022-06-11: 10 mL

## 2022-06-11 MED ORDER — LABETALOL HCL 5 MG/ML IV SOLN: 10.0000 mg | INTRAVENOUS | Status: AC | PRN

## 2022-06-11 MED ORDER — FENTANYL CITRATE (PF) 100 MCG/2ML IJ SOLN
INTRAMUSCULAR | Status: DC | PRN
  Administered 2022-06-11: 25 ug via INTRAVENOUS

## 2022-06-11 MED ORDER — HYDRALAZINE HCL 20 MG/ML IJ SOLN: 10.0000 mg | INTRAMUSCULAR | Status: AC | PRN

## 2022-06-11 MED ORDER — LIDOCAINE HCL (PF) 1 % IJ SOLN
INTRAMUSCULAR | Status: AC
  Filled 2022-06-11: qty 30

## 2022-06-11 MED ORDER — SODIUM CHLORIDE 0.9 % IV SOLN: 250.0000 mL | INTRAVENOUS | Status: DC | PRN

## 2022-06-11 SURGICAL SUPPLY — 10 items
CATH BALLN WEDGE 5F 110CM (CATHETERS) IMPLANT
CATH SWAN GANZ 7F STRAIGHT (CATHETERS) IMPLANT
GLIDESHEATH SLENDER 7FR .021G (SHEATH) IMPLANT
KIT HEART LEFT (KITS) ×2 IMPLANT
KIT MICROPUNCTURE NIT STIFF (SHEATH) IMPLANT
PACK CARDIAC CATHETERIZATION (CUSTOM PROCEDURE TRAY) ×2 IMPLANT
SHEATH GLIDE SLENDER 4/5FR (SHEATH) IMPLANT
SHEATH PINNACLE 7F 10CM (SHEATH) IMPLANT
TRANSDUCER W/STOPCOCK (MISCELLANEOUS) ×2 IMPLANT
WIRE EMERALD 3MM-J .025X260CM (WIRE) IMPLANT

## 2022-06-11 NOTE — Progress Notes (Addendum)
Patient Name: Stanley [name] Date of Encounter: 06/11/2022  Primary Cardiologist: Arvilla Meres, MD Electrophysiologist: Dr. Lalla Brothers (Previously Dr. Johney Frame)  Interval Summary   No further VT  Remains on HFNC, Antibiotics, and pressor support.  Coox 68%, CVP 10  Inpatient Medications    Scheduled Meds:  atorvastatin  20 mg Oral Daily   Chlorhexidine Gluconate Cloth  6 each Topical Daily   enoxaparin (LOVENOX) injection  40 mg Subcutaneous Q24H   folic acid  1 mg Oral Daily   furosemide  80 mg Intravenous Once   lidocaine  1 patch Transdermal Q24H   multivitamin with minerals  1 tablet Oral Daily   nitroGLYCERIN  1 inch Topical Q8H   pantoprazole  40 mg Oral Daily   PARoxetine  20 mg Oral Daily   potassium chloride  40 mEq Oral Once   sodium chloride flush  3 mL Intravenous Q12H   thiamine  100 mg Oral Daily   traZODone  150 mg Oral QHS   Continuous Infusions:  sodium chloride Stopped (06/10/22 0531)   sodium chloride 10 mL/hr at 06/11/22 0800   sodium chloride     amiodarone 30 mg/hr (06/11/22 0800)   cefTRIAXone (ROCEPHIN)  IV 2 g (06/11/22 0936)   metronidazole Stopped (06/10/22 2133)   norepinephrine (LEVOPHED) Adult infusion 16 mcg/min (06/11/22 0800)   PRN Meds: sodium chloride, alum & mag hydroxide-simeth, guaiFENesin, LORazepam, LORazepam, prochlorperazine, sodium chloride flush   Vital Signs    Vitals:   06/11/22 0830 06/11/22 0845 06/11/22 0857 06/11/22 0900  BP:    119/85  Pulse: 90 89  90  Resp: (!) 27 (!) 24  (!) 24  Temp:   98.8 F (37.1 C)   TempSrc:   Oral   SpO2: 100% 100%  100%  Weight:      Height:        Intake/Output Summary (Last 24 hours) at 06/11/2022 1009 Last data filed at 06/11/2022 0800 Gross per 24 hour  Intake 2515.49 ml  Output 1875 ml  Net 640.49 ml   Filed Weights   06/09/22 0438 06/10/22 0130 06/11/22 0500  Weight: 63 kg 65.5 kg 65 kg    Physical Exam    GEN- The patient is ill appearing, alert and oriented x 3  today. On HFNC  Lungs- Diminished  Cardiac- Regular rate and rhythm, no murmurs, rubs or gallops GI- soft, NT, ND, + BS Extremities- no clubbing or cyanosis. No edema  Telemetry    NSR in 80s (personally reviewed)  Hospital Course    Stanley [name] is a 57 y.o. male with systolic HF due to NICM d/x'd in 2006 (felt 2/2 HTN/ETOH), ? CAD s/p PCI to unknown vessel 2011 at outside location (cath 2014 without CAD and normal coronaries by CT 07/2016), VT s/p [facility] ICD implant ([place] 2015), noncompliance, chronic noncardiac chest pain, depression, HTN and ETOH abuse.  Admitted with VT with ICD shocks in the setting of acute decompensated HF, med noncompliance, ETOH intoxication and electrolyte derangements. AHF to see for acute on chronic systolic heart failure. Also w/ possible aspiration PNA.   Assessment & Plan    VT with ICD shock In setting of med non-compliance, ETOH abuse, and hypokalemia.  ? Worsening CHF as well.  Continue IV amiodarone while his other issues are managed No further VT.    Acute on chronic systolic CHF s/p Barostim (standard) and [place] [employer] single chamber ICD Non-ischemic NYHA III chronically Non-compliant with coreg, losartan, Bidil, and spiro.  Not  Entresto candidate (h/o angioedema)   PICC line in place and management per HF team.  Remains on HFNC and levophed.    Acute respiratory failure ? Asp PNA DDx includes A/C CHF, PE, aspiration (coughing episode) and worsening withdrawal from ETOH Appreciate CCM care On Abx   Hypokalemia Potassium3.2* (06/06 0349) Magnesium  2.9* (06/06 0349) Creatinine, ser  1.78* (06/06 0349) Keep K > 4.0 and Mg > 2.0    ETOH abuse Drinks most days, most of the day.  States he had 5 shots last night before bed.  Blood level 100 mg/dl on admission (consistent with 0.10 BAC) CIWA protocol in place.   Patient is critically ill and in danger of multiple system organ failure.    For questions or updates, please contact [facility] Please consult [email or web address] for contact info under Cardiology/STEMI.  Signed, Graciella Freer, PA-C  06/11/2022, 10:09 AM

## 2022-06-11 NOTE — Progress Notes (Addendum)
Advanced Heart Failure Rounding Note  PCP-Cardiologist: Arvilla Meres, MD   Subjective:    6/3: Admitted w/ ICD shocks for VT + intoxication and metabolic derangement (K 3.1, Mg 1.2)  6/4: Developed acute respiratory failure, acute pulmonary edema ? Aspiration PNA. Worsened overnight w/ hypotension, transferred to ICU and started on pressors. Also w/ ETOH withdrawal  Mentation improved this morning. Alert and oriented. No further agitation. Restraints off.    Remains on 25L HHFNC, 40% + high pressor requirements, on NE 13 currently. RN weaning down. MAP in the 80s currently.   Co-ox 68%. CVP 10   1.8L in UOP yesterday but net + 1.9L   K 3.2 Mg 2.9   Scr trending back down, 1.14>>1.43>>1.94 >>1.78   PCT 0.46>>1.95. WBC 10>>11>>12K. On Ceftriaxone + Flagyl. Afebrile   No further VT   CIWA protocol   Objective:   Weight Range: 65 kg Body mass index is 23.85 kg/m.   Vital Signs:   Temp:  [98.1 F (36.7 C)-98.8 F (37.1 C)] 98.8 F (37.1 C) (06/06 0857) Pulse Rate:  [77-181] 90 (06/06 0900) Resp:  [16-33] 24 (06/06 0900) BP: (55-122)/(26-107) 119/85 (06/06 0900) SpO2:  [89 %-100 %] 100 % (06/06 0900) FiO2 (%):  [35 %-50 %] 35 % (06/06 0800) Weight:  [65 kg] 65 kg (06/06 0500) Last BM Date : 06/10/22  Weight change: Filed Weights   06/09/22 0438 06/10/22 0130 06/11/22 0500  Weight: 63 kg 65.5 kg 65 kg    Intake/Output:   Intake/Output Summary (Last 24 hours) at 06/11/2022 0911 Last data filed at 06/11/2022 0800 Gross per 24 hour  Intake 2731.18 ml  Output 1875 ml  Net 856.18 ml      Physical Exam   CVP 10  General:  on HHFNC. No respiratory difficulty HEENT: normal Neck: supple. JVD 9 cm. Rt IJ CVC, Carotids 2+ bilat; no bruits. No lymphadenopathy or thyromegaly appreciated. Cor: PMI nondisplaced. Regular rate & rhythm. No rubs, gallops or murmurs. Lungs: decreased BS at the bases bilaterally  Abdomen: soft, nontender, nondistended. No  hepatosplenomegaly. No bruits or masses. Good bowel sounds. Extremities: no cyanosis, clubbing, rash, edema Neuro: alert & oriented x 3, cranial nerves grossly intact. moves all 4 extremities w/o difficulty. Affect pleasant.   Telemetry   NSR 84. No further VT   EKG    No new EKG to review   Labs    CBC Recent Labs    06/08/22 1132 06/08/22 1206 06/10/22 1121 06/11/22 0349  WBC 4.4   < > 11.5* 12.4*  NEUTROABS 3.2  --   --   --   HGB 11.9*   < > 12.9* 12.4*  HCT 35.2*   < > 38.4* 36.2*  MCV 105.4*   < > 100.3* 102.0*  PLT 156   < > 147* 163   < > = values in this interval not displayed.   Basic Metabolic Panel Recent Labs    16/10/96 0522 06/10/22 0552 06/11/22 0349  NA 132*  --  134*  K 4.5  --  3.2*  CL 96*  --  101  CO2 19*  --  23  GLUCOSE 124*  --  130*  BUN 15  --  19  CREATININE 1.94*  --  1.78*  CALCIUM 8.8*  --  8.2*  MG  --  3.4* 2.9*  PHOS  --  1.1*  --    Liver Function Tests Recent Labs    06/09/22 0128 06/10/22 0454  AST 235* 124*  ALT 177* 129*  ALKPHOS 102 82  BILITOT 2.1* 2.0*  PROT 6.9 6.3*  ALBUMIN 3.6 3.0*   Recent Labs    06/08/22 1326  LIPASE 60*   Cardiac Enzymes No results for input(s): "CKTOTAL", "CKMB", "CKMBINDEX", "TROPONINI" in the last 72 hours.  BNP: BNP (last 3 results) Recent Labs    06/10/22 0552  BNP 2,797.6*    ProBNP (last 3 results) No results for input(s): "PROBNP" in the last 8760 hours.   D-Dimer No results for input(s): "DDIMER" in the last 72 hours. Hemoglobin A1C Recent Labs    06/09/22 0936  HGBA1C 4.4*   Fasting Lipid Panel No results for input(s): "CHOL", "HDL", "LDLCALC", "TRIG", "CHOLHDL", "LDLDIRECT" in the last 72 hours. Thyroid Function Tests No results for input(s): "TSH", "T4TOTAL", "T3FREE", "THYROIDAB" in the last 72 hours.  Invalid input(s): "FREET3"  Other results:   Imaging    DG Chest Port 1 View  Result Date: 06/10/2022 CLINICAL DATA:  Respiratory distress.  EXAM: PORTABLE CHEST 1 VIEW COMPARISON:  Earlier same day FINDINGS: The cardio pericardial silhouette is enlarged. Diffuse airspace disease in the right lung is similar to prior. Right IJ central line is new in the interval with tip overlying the region of the innominate vein confluence. No evidence for pneumothorax or substantial right pleural effusion. Left-sided AICD remains in place with battery pack for right-sided stimulator device evident. Telemetry leads overlie the chest. IMPRESSION: New right IJ central line tip overlies the region of the innominate vein confluence. No pneumothorax or substantial right pleural effusion. Similar diffuse airspace disease in the right lung. Electronically Signed   By: Kennith Center M.D.   On: 06/10/2022 14:39     Medications:     Scheduled Medications:  atorvastatin  20 mg Oral Daily   Chlorhexidine Gluconate Cloth  6 each Topical Daily   enoxaparin (LOVENOX) injection  40 mg Subcutaneous Q24H   folic acid  1 mg Oral Daily   lidocaine  1 patch Transdermal Q24H   multivitamin with minerals  1 tablet Oral Daily   nitroGLYCERIN  1 inch Topical Q8H   pantoprazole  40 mg Oral Daily   PARoxetine  20 mg Oral Daily   sodium chloride flush  3 mL Intravenous Q12H   thiamine  100 mg Intravenous Daily   thiamine  100 mg Oral Daily   traZODone  150 mg Oral QHS    Infusions:  sodium chloride Stopped (06/10/22 0531)   sodium chloride 10 mL/hr at 06/11/22 0800   sodium chloride     amiodarone 30 mg/hr (06/11/22 0800)   cefTRIAXone (ROCEPHIN)  IV Stopped (06/10/22 1200)   dexmedetomidine (PRECEDEX) IV infusion Stopped (06/10/22 1037)   metronidazole Stopped (06/10/22 2133)   norepinephrine (LEVOPHED) Adult infusion 16 mcg/min (06/11/22 0800)   phenylephrine (NEO-SYNEPHRINE) Adult infusion Stopped (06/10/22 1058)   potassium chloride 10 mEq (06/11/22 0828)    PRN Medications: sodium chloride, alum & mag hydroxide-simeth, guaiFENesin, LORazepam, LORazepam,  prochlorperazine, sodium chloride flush    Patient Profile   Kele Quillan is a 57 y.o. male with systolic HF due to NICM d/x'd in 2006 (felt 2/2 HTN/ETOH), ? CAD s/p PCI to unknown vessel 2011 at outside location (cath 2014 without CAD and normal coronaries by CT 07/2016), VT s/p [facility] ICD implant ([place] 2015), noncompliance, chronic noncardiac chest pain, depression, HTN and ETOH abuse.  Admitted with VT with ICD shocks in the setting of acute decompensated HF, med noncompliance, ETOH intoxication  and electrolyte derangements. AHF to see for acute on chronic systolic heart failure. Also w/ possible aspiration PNA.   Assessment/Plan   1. VT: Shocks from [employer] ICD, now in NSR on amiodarone gtt. No prior chest pain, prior coronary workups have shown no significant coronary disease. HS-TnI mildly elevated with no trend, suspect demand ischemia from volume overload and VT arrest.  Suspect arrest was due to underlying NICM, off meds, with hypokalemia and worsening CHF.  No further VT on tele.  - continue amio gtt at 30 hr  - EP following  - Keep K > 4.0 and Mg > 2.0. Give K supp  (3.2)  2. Acute on chronic systolic CHF: Patient with history of NICM, last coronary evaluation was CTA in 2018 with normal coronaries and calcium score 0.  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  Most recent prior echo in 2021 with EF 25-30%.  Echo this admission with EF < 20%, mild RV dysfunction, moderate-severe TR, moderate MR. Developed acute pulmonary edema yesterday and diuresed w/ IV Lasix. Now w/ hypotension requiring pressor support and AKI, SCr bumped 1.13>>1.94. Suspect component of septic shock 2/2 PNA. PCT 0.46>>1.95. WBC 10>>11>>12K. On Ceftriaxone + Flagyl. Co-ox 68% today. CVP 10. MAP 80s. Scr trending down, 1.7 today  - Continue NE. Wean as tolerated. Keep MAP 65-70 - Give Lasix 80 mg IV   - Hold Losartan, Bidil, Jardiance and Metoprolol  - plan RHC today to assess filling pressures and cardiac  output - Not candidate for advanced therapies at this time with heavy ETOH abuse.  3. ETOH abuse: Intoxicated at admission. Heavy ETOH use, likely contributes to cardiomyopathy.  - CIWA protocol.  4. Suspected Aspiration PNA - PCT 0.46>>1.95. WBC 10>>11>>12K. On Ceftriaxone + Flagyl.  5. AKI: SCr bumped 1.13>>1.94>>1.78 today. Suspect due to hypotension +/- cardiorenal  - continue NE support, Co-ox 68% - follow BMP  - Plan RHC today  6. Acute hypoxemic respiratory failure: Diffuse airspace disease in the right lung, suspect aspiration.  - Ceftriaxone+ Flagyl   Length of Stay: 2  Robbie Lis, PA-C  06/11/2022, 9:11 AM  Advanced Heart Failure Team Pager [phone] (M-F; 7a - 5p)  Please contact [facility] for night-coverage after hours (5p -7a ) and weekends on [email or web address]  Patient seen with PA, agree with the above note.    RHC Procedural Findings (done today): Hemodynamics (mmHg) RA mean 10 RV 49/16 PA 50/23, mean 34 PCWP mean 11 Oxygen saturations: PA 65% AO 100% Cardiac Output (Fick) 3.87  Cardiac Index (Fick) 2.25 PVR 5.9 WU  Cardiac Output (Thermo) 2.92  Cardiac Index (Thermo) 1.78 PAPi 2.7   Patient is on NE 6.  Creatinine lower at 1.7.  CXR with right-sided airspace disease, PCT up to 1.95.  He is on ceftriaxone/Flagyl.   Breathing gradually improving but still on HFNC.    Mental status much clearer today.   General: NAD Neck: JVP 8-9 cm, no thyromegaly or thyroid nodule.  Lungs: Clear to auscultation bilaterally with normal respiratory effort. CV: Nondisplaced PMI.  Heart regular S1/S2, no S3/S4, 2/6 HSM LLSB/apex.  No peripheral edema.  Abdomen: Soft, nontender, no hepatosplenomegaly, no distention.  Skin: Intact without lesions or rashes.  Neurologic: Alert and oriented x 3.  Psych: Normal affect. Extremities: No clubbing or cyanosis.  HEENT: Normal.   No further VT.  IV amiodarone for now while on pressors/inotropes.   Mixed  cardiogenic/septic shock. PCT 1.95, suspect aspiration PNA.  CI 2.25 by Fick and 1.78 by thermodilution  on RHC today. Mildly elevated RA pressure with normal PCWP. Creatinine lower at 1.7.  - Wean NE as tolerated.  - With low CI by thermodilution, will start dobutamine 2.5 for now.  - GDMT on hold with hypotension and AKI.  - Had Lasix 80 mg IV x 1 this morning.  Will reassess CVP in am for further dosing.   Severe ETOH withdrawal.  This seems to be resolving.  Patient is no longer delirious.   CRITICAL CARE Performed by: Marca Ancona  Total critical care time: 35 minutes  Critical care time was exclusive of separately billable procedures and treating other patients.  Critical care was necessary to treat or prevent imminent or life-threatening deterioration.  Critical care was time spent personally by me on the following activities: development of treatment plan with patient and/or surrogate as well as nursing, discussions with consultants, evaluation of patient's response to treatment, examination of patient, obtaining history from patient or surrogate, ordering and performing treatments and interventions, ordering and review of laboratory studies, ordering and review of radiographic studies, pulse oximetry and re-evaluation of patient's condition.  Marca Ancona 06/11/2022

## 2022-06-11 NOTE — TOC Initial Note (Signed)
Transition of Care [facility]) - Initial/Assessment Note    Patient Details  Name: Stanley [name] MRN: [id number] Date of Birth: 1965-05-04  Transition of Care [facility]) CM/SW Contact:    Gala Lewandowsky, RN Phone Number: 06/11/2022, 12:08 PM  Clinical Narrative: Risk for readmission assessment completed. Patient presented for ICD shock. Case Manager received a consult to speak with patients sister Paticia Stack. Case Manager did call the sister on yesterday, she states patient was previously home alone and is disabled. Sister reports that the patient has DME cane; however, it is broken. Sister had questions in regards to disposition plan-she does not want the patient to return home alone. Patient continues on pressor support- discussed with sister that we will need to see how the patient progresses before we discuss a disposition plan. As the patient progresses PT/OT will continue to work with the patient. Initial recommendation for CIR; however, unable to tell if the patient will be a potential candidate. Case Manager will continue to follow for transition of care needs.                      Expected Discharge Plan:  (TBD) Barriers to Discharge: Continued Medical Work up   Expected Discharge Plan and Services   Discharge Planning Services: CM Consult Post Acute Care Choice: IP Rehab Living arrangements for the past 2 months: Single Family Home                   DME Agency: NA   Prior Living Arrangements/Services Living arrangements for the past 2 months: Single Family Home Lives with:: Self Patient language and need for interpreter reviewed:: Yes        Need for Family Participation in Patient Care: Yes (Comment) Care giver support system in place?: Yes (comment) Current home services: DME (Per sister patient has a cane that's broken.) Criminal Activity/Legal Involvement Pertinent to Current Situation/Hospitalization: No - Comment as needed  Permission Sought/Granted Permission  sought to share information with : Family Supports, Case Manager     Psych Involvement: No (comment)  Admission diagnosis:  Hypokalemia [E87.6] AICD discharge [Z45.02] Alcoholic intoxication without complication (HCC) [F10.920] Patient Active Problem List   Diagnosis Date Noted   Shock (HCC) 06/10/2022   Acute hypoxic respiratory failure (HCC) 06/09/2022   Acute on chronic combined systolic and diastolic CHF (congestive heart failure) (HCC) 06/09/2022   AICD discharge 06/08/2022   Abdominal pain 06/08/2022   Anxiety and depression 06/08/2022   Genetic testing 06/13/2021   Family history of breast cancer 05/30/2021   Family history of pancreatic cancer 05/30/2021   Diverticulosis of colon without hemorrhage    Hematemesis 05/18/2021   ABLA (acute blood loss anemia) 05/18/2021   Benign neoplasm of colon    Abdominal pain, epigastric    Early satiety    Dysphagia    Gastroesophageal reflux disease 04/23/2020   Colon cancer screening 04/23/2020   CAD (coronary artery disease) 07/07/2019   Congestive heart failure (CHF) (HCC) 07/07/2019   Moderate episode of recurrent major depressive disorder (HCC) 01/11/2019   Chronic systolic heart failure (HCC)    Obstructive sleep apnea 08/11/2018   Implantable cardioverter-defibrillator (ICD) in situ 03/21/2018   ETOH abuse 03/21/2018   AKI (acute kidney injury) (HCC) 03/21/2018   History of noncompliance with medical treatment, presenting hazards to health 03/21/2018   Ventricular tachycardia (HCC) 03/21/2018   Gout 09/25/2015   Nonischemic cardiomyopathy (HCC) 07/03/2015   Chronic systolic dysfunction of left ventricle 07/03/2015  Essential hypertension 07/03/2015   PCP:  [name], NP Pharmacy:   [facility] [id number] Gin[place] Otto, [place] - [address] AT [facility] [address] [place] [address] Phone: [phone] Fax: [phone]  [facility] [address] [place] [address] Phone: [phone] Fax: [phone]  [facility] - [place], [place] - [address] [place] [address] Phone: [phone] Fax: [phone]  [facility] - [place], [place] - [address] [place] [address] Phone: [phone] Fax: [phone]  [facility] - [facility] [address] [place] [address] Phone: [phone] Fax: [phone]     Social Determinants of Health (SDOH) Social History: SDOH Screenings   Food Insecurity: Food Insecurity Present (03/21/2018)  Transportation Needs: No Transportation Needs (03/21/2018)  Alcohol Screen: Low Risk  (02/07/2019)  Depression (PHQ2-9): High Risk (02/11/2022)  Financial Resource Strain: Medium Risk (03/21/2018)  Physical Activity: Unknown (03/21/2018)  Social Connections: Unknown (03/21/2018)  Stress: Stress Concern Present (03/21/2018)  Tobacco Use: Low Risk  (06/08/2022)    Readmission Risk Interventions    06/11/2022   11:32 AM  Readmission Risk Prevention Plan  Transportation Screening Complete  HRI or Home Care Consult Complete  Social Work Consult for Recovery Care Planning/Counseling Complete  Palliative Care Screening Not Applicable  Medication Review Oceanographer) Referral to Pharmacy

## 2022-06-11 NOTE — Progress Notes (Signed)
NAME:  Stanley [name], MRN:  [id number], DOB:  04-Oct-1965, LOS: 2 ADMISSION DATE:  06/08/2022, CONSULTATION DATE:  06/09/22 REFERRING MD:  Pollie Meyer CHIEF COMPLAINT:  Dyspnea   History of Present Illness:  Stanley [name] is a 57 y.o. male who has a PMH as below. He was admitted 6/3 after ICD discharged multiple times at his home. He has both ICD and Barostim devices in place. He was seen by EP and his ICD discharges were found to be 2/2 VT presumed in the setting of medication non-compliance, EtOH intoxication and associated electrolyte derangements.  He was started on Amiodarone and was admitted for further workup. He is being seen by EP as well as AHF.  AM 6/4, he apparently choked on a banana and shortly thereafter, had an episode of desaturation and possibly increased WOB. His O2 via [place] was turned up but did not improve his hypoxia; therefore, he was placed on BiPAP. CXR was obtained and revealed pulmonary edema R > L for which he received Lasix. There is also some concern for possible aspiration.  PCCM called in consultation AM 6/4 for further recs. At the time of our evaluation, pt was on BiPAP and asking why he was still on it. ABG from earlier this morning showed respiratory alkalosis (7.47/28/184).  He tells [place] that he has chronic dysphagia to both solids and liquids and that he has been evaluated by GI in the past along with abdominal pain and bloating. Review of his chart shows that he saw GI in 2022  and had EGD 03/06/21 that demonstrated a 1cm hiatal hernia and he had empiric esophageal dilation using a savary dilator with mild resistance at 17mm. He was started on Zofran and his Protonix was increased from QD to BID dosing. 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 There was no reason to explain his bleeding.   Echo 2018: ef 35-40% 2020: 25-30% 2021; 25-30% 56/4/24: < 25%  Pertinent  Medical  History:  has Nonischemic cardiomyopathy (HCC); Chronic systolic dysfunction of left ventricle; Essential hypertension; Gout; Implantable cardioverter-defibrillator (ICD) in situ; ETOH abuse; AKI (acute kidney injury) (HCC); History of noncompliance with medical treatment, presenting hazards to health; Ventricular tachycardia (HCC); Obstructive sleep apnea; Chronic systolic heart failure (HCC); Moderate episode of recurrent major depressive disorder (HCC); CAD (coronary artery disease); Congestive heart failure (CHF) (HCC); Gastroesophageal reflux disease; Colon cancer screening; Benign neoplasm of colon; Abdominal pain, epigastric; Early satiety; Dysphagia; Hematemesis; ABLA (acute blood loss anemia); Diverticulosis of colon without hemorrhage; Family history of breast cancer; Family history of pancreatic cancer; Genetic testing; AICD discharge; Abdominal pain; Anxiety and depression; Acute hypoxic respiratory failure (HCC); Acute on chronic combined systolic and diastolic CHF (congestive heart failure) (HCC); and Shock (HCC) on their problem list.  Significant Hospital Events: Including procedures, antibiotic start and stop dates in addition to other pertinent events   6/3 admit 6/4 PCCM consult - BiPAP removed, placed on [place] and have asked RT to start HHFNC. He is answering questions appropriately. Says his breathing feels ok. Has ongoing abdominal discomfort but says this is chronic. Admits to chronic dysphagia and admits to choking on a piece of banana earlier. He does not feel that he aspirated and says he was able to cough it up pretty easily.  Interim History / Subjective:  This morning he denies complaints and feels that his breathing is improving.   Objective:  Blood pressure 119/85,  pulse 90, temperature 98.8 F (37.1 C), temperature source Oral, resp. rate (!) 24, height 5\' 5"  (1.651 m), weight 65 kg, SpO2 100 %. CVP:  [9 mmHg-19 mmHg] 10 mmHg  FiO2 (%):  [35 %-50 %] 35 %   Intake/Output  Summary (Last 24 hours) at 06/11/2022 1009 Last data filed at 06/11/2022 0800 Gross per 24 hour  Intake 2515.49 ml  Output 1875 ml  Net 640.49 ml    Filed Weights   06/09/22 0438 06/10/22 0130 06/11/22 0500  Weight: 63 kg 65.5 kg 65 kg    Examination: General: chronically ill appearing man lying in bed in NAD Neuro: awake, alert, answering questions appropriately, moving extremities  HEENT: [place]/AT, eyes anicteric Cardiovascular: S1S2, RRR Lungs: less rhales, breathing comfortably on HHF Abdomen: soft, NT Musculoskeletal: no cyanosis or edema Skin: warm, dry, no rashes  Na+ 134 BUN 19 Cr 1.78 Coox 68 % on NE 16 PCT   0.46> 1.95   Resolved probelms:    Assessment & Plan:   Acute on chronic HFrEF, cardiogenic shock VT causing AICD discharges at admission- felt to be 2/2 EtOH and electrolyte derangements. -con't NE, CVC and Aline still needed -hold Bblocker while on inotropes -amiodarone -farxiga -heart cath today -aspirin, statin -con't diuresis -monitor electrolytes, replete as needed -appreciate EP's management  Acute hypoxic respiratory failure - presumed after choking event with possible aspiration; though, likely also component of flash pulmonary edema  Likely aspiration pneumonia vs pneumonitis  -ceftriaxone, flagyl- 7 day course needed for aspiration pneumonia -wean FiO2 as able-- remains on 25L ,40% -pulmonary hygiene -lasix  Hypophosphatemia -recheck today  Acute metabolic encephalopathy likely 2/2 ETOH withdrawal -wean precedex off as able -CIWA -vitamins  Chronic dysphagia with chronic abdominal pain/bloating. - May need repeat EGD - not a good candidate for reglan with arrhythmias and prolonged QTc  AKI, improving Hypervolemic hyponatremia -strict I/O -renally dose meds, avoid nephrotoxic meds -monitor  Hyperbilirubinemia and transaminase elevation- likely due to hypoperfusion and congestion -monitor  History of hypertension; currently  hypotensive -hold PTA antihypertensives -norepi to maintain MAP >65; serial coox  EtOH dependence  -will counsel on the importance of quitting -vitamins  Hx of Depression -con't paxil  Acute anemia, likely due to acute illness -transfuse for Hb <7 or hemodynamically significant bleeding  Best practice:  Diet: advance diet after cath Pain/Anxiety/Delirium protocol (if indicated): precedex and CIWA VAP protocol (if indicated): n/a DVT prophylaxis: Lovenox  GI prophylaxis: ppi Glucose control: ssi Mobility: bed rest Code Status: full code Family Communication: no family at bedside Disposition: ICU   LABS     CBC Recent Labs  Lab 06/09/22 2355 06/10/22 1121 06/11/22 0349  HGB 13.7 12.9* 12.4*  HCT 39.2 38.4* 36.2*  WBC 10.4 11.5* 12.4*  PLT 157 147* 163     This patient is critically ill with multiple organ system failure which requires frequent high complexity decision making, assessment, support, evaluation, and titration of therapies. This was completed through the application of advanced monitoring technologies and extensive interpretation of multiple databases. During this encounter critical care time was devoted to patient care services described in this note for 33 minutes.  Steffanie Dunn, DO 06/11/22 10:30 AM [facility]  For contact information, see Amion. If no response to pager, please call PCCM consult pager. After hours, 7PM- 7AM, please call Elink.

## 2022-06-11 NOTE — Progress Notes (Signed)
PT Cancellation Note  Patient Details Name: Stanley [name] MRN: [id number] DOB: [birth year]   Cancelled Treatment:    Reason Eval/Treat Not Completed: Other (comment) (pt with planned RHC at 1030 and RN request hold til cleared post cath)   Vahan Wadsworth B Keneth Borg 06/11/2022, 9:25 AM Merryl Hacker, PT Acute Rehabilitation Services Office: [phone]

## 2022-06-11 NOTE — Interval H&P Note (Signed)
History and Physical Interval Note:  06/11/2022 4:15 PM  Stanley [name]  has presented today for surgery, with the diagnosis of heart failure.  The various methods of treatment have been discussed with the patient and family. After consideration of risks, benefits and other options for treatment, the patient has consented to  Procedure(s): RIGHT HEART CATH (N/A) as a surgical intervention.  The patient's history has been reviewed, patient examined, no change in status, stable for surgery.  I have reviewed the patient's chart and labs.  Questions were answered to the patient's satisfaction.     Zalyn Amend [employer]

## 2022-06-11 NOTE — Progress Notes (Signed)
eLink Physician-Brief Progress Note Patient Name: Stanley [name] DOB: Nov 08, 1965 MRN: [id number]   Date of Service  06/11/2022  HPI/Events of Note  K+ 3.2, Cr 1.78, GFR 44.  eICU Interventions  KCL 10 meq iv Q 1 hour x 3 doses ordered.        Stanley [name] 06/11/2022, 5:58 AM

## 2022-06-11 NOTE — Progress Notes (Signed)
SLP Cancellation Note  Patient Details Name: Stanley [name] MRN: [id number] DOB: [birth year]   Cancelled treatment:       Reason Eval/Treat Not Completed: Patient at procedure or test/unavailable. SLP will f/u next date.  Angela Nevin, MA, CCC-SLP Speech Therapy

## 2022-06-11 NOTE — Progress Notes (Signed)
OT Cancellation Note  Patient Details Name: Hoyle Montero MRN: [id number] DOB: 03-12-1965   Cancelled Treatment:    Reason Eval/Treat Not Completed: Patient at procedure or test/ unavailable.  OT to continue efforts.  Roran Wegner D Emanuele Mcwhirter 06/11/2022, 4:13 PM 06/11/2022  RP, OTR/L  Acute Rehabilitation Services  Office:  [phone]

## 2022-06-12 ENCOUNTER — Inpatient Hospital Stay (HOSPITAL_COMMUNITY): Payer: Medicare Other

## 2022-06-12 ENCOUNTER — Encounter (HOSPITAL_COMMUNITY): Payer: Self-pay | Admitting: Cardiology

## 2022-06-12 LAB — BASIC METABOLIC PANEL
Anion gap: 10 (ref 5–15)
BUN: 22 mg/dL — ABNORMAL HIGH (ref 6–20)
CO2: 23 mmol/L (ref 22–32)
Calcium: 8.3 mg/dL — ABNORMAL LOW (ref 8.9–10.3)
Chloride: 100 mmol/L (ref 98–111)
Creatinine, Ser: 1.46 mg/dL — ABNORMAL HIGH (ref 0.61–1.24)
GFR, Estimated: 55 mL/min — ABNORMAL LOW (ref >60)
Glucose, Bld: 104 mg/dL — ABNORMAL HIGH (ref 70–99)
Potassium: 3.2 mmol/L — ABNORMAL LOW (ref 3.5–5.1)
Sodium: 133 mmol/L — ABNORMAL LOW (ref 135–145)

## 2022-06-12 LAB — CBC
HCT: 34.2 % — ABNORMAL LOW (ref 39.0–52.0)
Hemoglobin: 11.5 g/dL — ABNORMAL LOW (ref 13.0–17.0)
MCH: 35.1 pg — ABNORMAL HIGH (ref 26.0–34.0)
MCHC: 33.6 g/dL (ref 30.0–36.0)
MCV: 104.3 fL — ABNORMAL HIGH (ref 80.0–100.0)
Platelets: 125 10*3/uL — ABNORMAL LOW (ref 150–400)
RBC: 3.28 MIL/uL — ABNORMAL LOW (ref 4.22–5.81)
RDW: 12.5 % (ref 11.5–15.5)
WBC: 8.2 10*3/uL (ref 4.0–10.5)
nRBC: 0 % (ref 0.0–0.2)

## 2022-06-12 LAB — COOXEMETRY PANEL
Carboxyhemoglobin: 2.2 % — ABNORMAL HIGH (ref 0.5–1.5)
Methemoglobin: <0.7 % (ref 0.0–1.5)
O2 Saturation: 83.5 %
Total hemoglobin: 8.3 g/dL — ABNORMAL LOW (ref 12.0–16.0)

## 2022-06-12 LAB — VITAMIN B12: Vitamin B-12: 421 pg/mL (ref 180–914)

## 2022-06-12 LAB — BRAIN NATRIURETIC PEPTIDE: B Natriuretic Peptide: 254.6 pg/mL — ABNORMAL HIGH (ref 0.0–100.0)

## 2022-06-12 LAB — PHOSPHORUS: Phosphorus: 4.6 mg/dL (ref 2.5–4.6)

## 2022-06-12 LAB — MAGNESIUM: Magnesium: 2.1 mg/dL (ref 1.7–2.4)

## 2022-06-12 MED ORDER — SODIUM CHLORIDE 0.9 % IV SOLN
2.0000 g | INTRAVENOUS | Status: AC
  Administered 2022-06-12 – 2022-06-16 (×5): 2 g via INTRAVENOUS
  Filled 2022-06-12 (×5): qty 20

## 2022-06-12 MED ORDER — POTASSIUM CHLORIDE 10 MEQ/50ML IV SOLN
10.0000 meq | INTRAVENOUS | Status: AC
  Administered 2022-06-12 (×5): 10 meq via INTRAVENOUS
  Filled 2022-06-12 (×8): qty 50

## 2022-06-12 MED ORDER — FUROSEMIDE 10 MG/ML IJ SOLN
60.0000 mg | Freq: Once | INTRAMUSCULAR | Status: AC
  Administered 2022-06-12: 60 mg via INTRAVENOUS
  Filled 2022-06-12: qty 6

## 2022-06-12 NOTE — Progress Notes (Signed)
eLink Physician-Brief Progress Note Patient Name: Stanley [name] DOB: 1965/06/26 MRN: [id number]   Date of Service  06/12/2022  HPI/Events of Note  potassium 3.2, cret 1.46  eICU Interventions  Kcl IV     Intervention Category Minor Interventions: Electrolytes abnormality - evaluation and management  [name] 06/12/2022, 4:56 AM

## 2022-06-12 NOTE — Progress Notes (Signed)
Patient Name: Stanley [name] Certain Date of Encounter: 06/12/2022  Primary Cardiologist: Arvilla Meres, MD Electrophysiologist: None  Interval Summary   RHC 6/6 Normal PCWP with elevated RA pressure Mild/Mod PAH Fick mildly decreased, Thermo CI severely decreased Cardiac Output (Fick) 3.87  Cardiac Index (Fick) 2.25 PVR 5.9 WU  Cardiac Output (Thermo) 2.92  Cardiac Index (Thermo) 1.78   Started on dobutamine.  Awake and alert. Resting comfortably this am and states his breathing feels OK.   Inpatient Medications    Scheduled Meds:  atorvastatin  20 mg Oral Daily   Chlorhexidine Gluconate Cloth  6 each Topical Daily   enoxaparin (LOVENOX) injection  40 mg Subcutaneous Q24H   folic acid  1 mg Oral Daily   lidocaine  1 patch Transdermal Q24H   multivitamin with minerals  1 tablet Oral Daily   pantoprazole  40 mg Oral Daily   PARoxetine  20 mg Oral Daily   potassium chloride  40 mEq Oral Once   sodium chloride flush  3 mL Intravenous Q12H   sodium chloride flush  3 mL Intravenous Q12H   thiamine  100 mg Oral Daily   traZODone  150 mg Oral QHS   Continuous Infusions:  sodium chloride     sodium chloride     amiodarone 30 mg/hr (06/12/22 0700)   cefTRIAXone (ROCEPHIN)  IV     DOBUTamine 2.5 mcg/kg/min (06/12/22 0700)   norepinephrine (LEVOPHED) Adult infusion 2 mcg/min (06/12/22 0700)   potassium chloride 50 mL/hr at 06/12/22 0700   PRN Meds: sodium chloride, acetaminophen, alum & mag hydroxide-simeth, guaiFENesin, ondansetron (ZOFRAN) IV, prochlorperazine, sodium chloride flush   Vital Signs    Vitals:   06/12/22 0600 06/12/22 0615 06/12/22 0630 06/12/22 0700  BP:  113/74    Pulse: 81 (!) 181  90  Resp: 13 (!) 23 20 15   Temp:      TempSrc:      SpO2: 99% 100%  100%  Weight:      Height:        Intake/Output Summary (Last 24 hours) at 06/12/2022 0820 Last data filed at 06/12/2022 0700 Gross per 24 hour  Intake 1579.42 ml  Output 2800 ml  Net -1220.58 ml    Filed Weights   06/10/22 0130 06/11/22 0500 06/12/22 0426  Weight: 65.5 kg 65 kg 61.9 kg    Physical Exam    GEN- The patient is well appearing, alert and oriented x 3 today.   Lungs- Clear to ausculation bilaterally, normal work of breathing Cardiac- Regular rate and rhythm, no murmurs, rubs or gallops GI- soft, NT, ND, + BS Extremities- no clubbing or cyanosis. No edema  Telemetry    NSR 80s (personally reviewed)  Hospital Course    Stanley [name] is a 57 y.o. male with systolic HF due to NICM d/x'd in 2006 (felt 2/2 HTN/ETOH), ? CAD s/p PCI to unknown vessel 2011 at outside location (cath 2014 without CAD and normal coronaries by CT 07/2016), VT s/p [facility] ICD implant ([place] 2015), noncompliance, chronic noncardiac chest pain, depression, HTN and ETOH abuse.  Admitted with VT with ICD shocks in the setting of acute decompensated HF, med noncompliance, ETOH intoxication and electrolyte derangements. AHF to see for acute on chronic systolic heart failure. Also w/ possible aspiration PNA.   Started on dobutamine 6/6 with low CI by [employer].  Assessment & Plan    VT with ICD shock In setting of med non-compliance, ETOH abuse, and hypokalemia.  ? Worsening CHF as well.  Continue IV amiodarone, at least while on pressors.  No further VT    Acute on chronic systolic CHF s/p Barostim (standard) and [employer] single chamber ICD Non-ischemic NYHA III chronically Non-compliant with coreg, losartan, Bidil, and spiro.  Not Entresto candidate (h/o angioedema)   PICC line in place and management per HF team.  Remains on HFNC and levophed.     Acute respiratory failure ? Asp PNA DDx includes A/C CHF, PE, aspiration (coughing episode) and worsening withdrawal from ETOH Appreciate CCM care   Weaning O2 as tolerated   Hypokalemia Potassium3.2* (06/07 0334) Magnesium  2.1 (06/07 0334) Creatinine, ser  1.46* (06/07 0334) Keep K > 4.0 and Mg > 2.0    ETOH abuse Drinks most days,  most of the day.  States he had 5 shots last night before bed.  Blood level 100 mg/dl on admission (consistent with 0.10 BAC) CIWA protocol in place   For questions or updates, please contact [facility] Please consult [email or web address] for contact info under Cardiology/STEMI.  Signed, Graciella Freer, PA-C  06/12/2022, 8:20 AM

## 2022-06-12 NOTE — TOC Progression Note (Signed)
Transition of Care [facility]) - Progression Note    Patient Details  Name: Ennis Heavner MRN: [id number] Date of Birth: 1965/02/11  Transition of Care [facility]) CM/SW Contact  Lorri [name], LCSW Phone Number: 06/12/2022, 1:16 PM  Clinical Narrative:   referral sent out in hub for SNF.  Additional info needs to be uploaded into [place] [facility] for PASSR.    Expected Discharge Plan:  (TBD) Barriers to Discharge: Continued Medical Work up  Expected Discharge Plan and Services   Discharge Planning Services: CM Consult Post Acute Care Choice: IP Rehab Living arrangements for the past 2 months: Single Family Home Expected Discharge Date: 06/11/22                 DME Agency: NA                   Social Determinants of Health (SDOH) Interventions SDOH Screenings   Food Insecurity: Food Insecurity Present (03/21/2018)  Housing: Patient Declined (06/11/2022)  Transportation Needs: No Transportation Needs (06/11/2022)  Utilities: Not At Risk (06/11/2022)  Alcohol Screen: Low Risk  (02/07/2019)  Depression (PHQ2-9): High Risk (02/11/2022)  Financial Resource Strain: Medium Risk (03/21/2018)  Physical Activity: Unknown (03/21/2018)  Social Connections: Unknown (03/21/2018)  Stress: Stress Concern Present (03/21/2018)  Tobacco Use: Low Risk  (06/12/2022)    Readmission Risk Interventions    06/11/2022   11:32 AM  Readmission Risk Prevention Plan  Transportation Screening Complete  HRI or Home Care Consult Complete  Social Work Consult for Recovery Care Planning/Counseling Complete  Palliative Care Screening Not Applicable  Medication Review Oceanographer) Referral to Pharmacy

## 2022-06-12 NOTE — Progress Notes (Signed)
OT Cancellation Note  Patient Details Name: Geraldo Haris MRN: [id number] DOB: 1965/04/19   Cancelled Treatment:    Reason Eval/Treat Not Completed: Medical issues which prohibited therapy.  Femoral A-line limiting OOB per MD this date.  Will contine efforts.    Sultana Tierney D Julie-Ann Vanmaanen 06/12/2022, 9:39 AM 06/12/2022  RP, OTR/L  Acute Rehabilitation Services  Office:  [phone]

## 2022-06-12 NOTE — Progress Notes (Signed)
Speech Language Pathology Treatment: Dysphagia  Patient Details Name: Stanley [name] MRN: [id number] DOB: 05-Sep-1965 Today's Date: 06/12/2022 Time: 1191-4782 SLP Time Calculation (min) (ACUTE ONLY): 9 min  Assessment / Plan / Recommendation Clinical Impression  Pt seen this morning for safety with po's. He was on clear liquids and MD upgraded to regular this morning. He has a femoral cath so repositioned with help of nursing raising head of bed and using reverse Trendelenburg position. Initially assessed by SLP and recommended regular diet then he apparently had aspiration event with banana. RN states he was still going through withdrawal at that time. Today he was alert and appeared cognitively appropriate. Consecutive sips thin consumed over several trials without s/s aspiration. Orally masticated and transited regular texture throughout session without difficulty and cleared oral cavity. Vocal quality clear and recommend he continue regular/thin, pills with thin. He is going to have his femoral cath removed today and will be able to sit in more upright position. No further ST follow up needed at this time.    HPI HPI: [name] with PMH: MI, CAD, HTN, Gout,  ETOH abuse, esophageal dysphagia (2cm hiatal hernia h/o moderate stenosis at GE junction). He presented to the hospital on 06/08/22 with AICD discharge x3 after which he felt nauseous and vomitted. He has apparently been unable to keep his food down for past couple months and has not taken his medications due to being depressed and tired. On 06/09/22 patient had increased oxygen requirement from 7L to 15L HFNC and then required BiPAP. He was eventually transitioned back to [facility]. In morning of 6/4, he apparently choked on a banana and shortly thereafter had an episode of desaturation and possibly increased WOB. CXR was obtained and revealed pulmonary edema R > L for which he received Lasix. There is also some concern for possible  aspiration and SLP swallow evaluation ordered.      SLP Plan  All goals met      Recommendations for follow up therapy are one component of a multi-disciplinary discharge planning process, led by the attending physician.  Recommendations may be updated based on patient status, additional functional criteria and insurance authorization.    Recommendations  Diet recommendations: Regular;Thin liquid Liquids provided via: Straw;Cup Medication Administration: Whole meds with liquid Supervision: Patient able to self feed Compensations: Slow rate;Small sips/bites Postural Changes and/or Swallow Maneuvers: Seated upright 90 degrees                  Oral care BID   Intermittent Supervision/Assistance Dysphagia, unspecified (R13.10)     All goals met     Royce Macadamia  06/12/2022, 8:56 AM

## 2022-06-12 NOTE — Progress Notes (Signed)
RE:  Stanley [name]      Date of Birth:  February 26, 2065     Date:    06/12/22      To Whom It May Concern:  Please be advised that the above-named patient will require a short-term nursing home stay - anticipated 30 days or less for rehabilitation and strengthening.  The plan is for return home.                 MD signature                Date

## 2022-06-12 NOTE — Progress Notes (Addendum)
Advanced Heart Failure Rounding Note  PCP-Cardiologist: Arvilla Meres, MD   Subjective:    6/3: Admitted w/ ICD shocks for VT + intoxication and metabolic derangement (K 3.1, Mg 1.2)  6/4: Developed acute respiratory failure, acute pulmonary edema ? Aspiration PNA. Worsened overnight w/ hypotension, transferred to ICU and started on pressors. Also w/ ETOH withdrawal 6/6 RHC RA 10 PCWP 11 CO 3.87 CI 2.25 (Fick), CI 1.78 (thermo), PVR 5.9   Remains on 10L [place]   Currently on NE 1 mcg  DBA 2.5 mcg + amio 30 mg  per hour. . CO-OX 83%   PCT 0.46>>1.95. WBC 10>>11>>12K. On Ceftriaxone + Flagyl. Afebrile   Denies SOB.   Objective:   Weight Range: 61.9 kg Body mass index is 22.71 kg/m.   Vital Signs:   Temp:  [98.2 F (36.8 C)-98.8 F (37.1 C)] 98.5 F (36.9 C) (06/07 0829) Pulse Rate:  [0-181] 90 (06/07 0700) Resp:  [8-35] 15 (06/07 0700) BP: (90-123)/(62-90) 105/62 (06/07 0800) SpO2:  [72 %-100 %] 100 % (06/07 0800) FiO2 (%):  [30 %-40 %] 40 % (06/07 0800) Weight:  [61.9 kg] 61.9 kg (06/07 0426) Last BM Date : 06/11/22  Weight change: Filed Weights   06/10/22 0130 06/11/22 0500 06/12/22 0426  Weight: 65.5 kg 65 kg 61.9 kg    Intake/Output:   Intake/Output Summary (Last 24 hours) at 06/12/2022 0839 Last data filed at 06/12/2022 0800 Gross per 24 hour  Intake 1635.23 ml  Output 2800 ml  Net -1164.77 ml      Physical Exam  General:  . No resp difficulty HEENT: normal Neck: supple. no JVD. Carotids 2+ bilat; no bruits. No lymphadenopathy or thryomegaly appreciated. RIJ  Cor: PMI nondisplaced. Regular rate & rhythm. No rubs, gallops or murmurs. Lungs: clear on 10 liter .  Abdomen: soft, nontender, nondistended. No hepatosplenomegaly. No bruits or masses. Good bowel sounds. Extremities: no cyanosis, clubbing, rash, edema Femoral A line.  Neuro: alert & orientedx3, cranial nerves grossly intact. moves all 4 extremities w/o difficulty. Affect  pleasant   Telemetry   SR 80s   EKG    No new EKG to review   Labs    CBC Recent Labs    06/11/22 1734 06/12/22 0214  WBC 10.0 8.2  HGB 12.6* 11.5*  HCT 38.2* 34.2*  MCV 102.7* 104.3*  PLT 161 125*   Basic Metabolic Panel Recent Labs    81/19/14 0349 06/11/22 1107 06/11/22 1700 06/11/22 1734 06/12/22 0334  NA 134*  --  136  137  --  133*  K 3.2*  --  4.2  4.2  --  3.2*  CL 101  --   --   --  100  CO2 23  --   --   --  23  GLUCOSE 130*  --   --   --  104*  BUN 19  --   --   --  22*  CREATININE 1.78*  --   --  1.68* 1.46*  CALCIUM 8.2*  --   --   --  8.3*  MG 2.9*  --   --   --  2.1  PHOS  --  3.8  --   --  4.6   Liver Function Tests Recent Labs    06/10/22 0552  AST 124*  ALT 129*  ALKPHOS 82  BILITOT 2.0*  PROT 6.3*  ALBUMIN 3.0*   No results for input(s): "LIPASE", "AMYLASE" in the last 72 hours.  Cardiac  Enzymes No results for input(s): "CKTOTAL", "CKMB", "CKMBINDEX", "TROPONINI" in the last 72 hours.  BNP: BNP (last 3 results) Recent Labs    06/10/22 0552 06/12/22 0214  BNP 2,797.6* 254.6*    ProBNP (last 3 results) No results for input(s): "PROBNP" in the last 8760 hours.   D-Dimer No results for input(s): "DDIMER" in the last 72 hours. Hemoglobin A1C Recent Labs    06/09/22 0936  HGBA1C 4.4*   Fasting Lipid Panel No results for input(s): "CHOL", "HDL", "LDLCALC", "TRIG", "CHOLHDL", "LDLDIRECT" in the last 72 hours. Thyroid Function Tests No results for input(s): "TSH", "T4TOTAL", "T3FREE", "THYROIDAB" in the last 72 hours.  Invalid input(s): "FREET3"  Other results:   Imaging    CARDIAC CATHETERIZATION  Result Date: 06/11/2022 1. Normal PCWP with elevated RA pressure. 2. Mild-moderate pulmonary arterial hypertension. 3. Fick CI mildly decreased, thermodilution CI severe decreased. Will start dobutamine 2.5 mcg/kg/min.     Medications:     Scheduled Medications:  atorvastatin  20 mg Oral Daily   Chlorhexidine  Gluconate Cloth  6 each Topical Daily   enoxaparin (LOVENOX) injection  40 mg Subcutaneous Q24H   folic acid  1 mg Oral Daily   lidocaine  1 patch Transdermal Q24H   multivitamin with minerals  1 tablet Oral Daily   pantoprazole  40 mg Oral Daily   PARoxetine  20 mg Oral Daily   potassium chloride  40 mEq Oral Once   sodium chloride flush  3 mL Intravenous Q12H   sodium chloride flush  3 mL Intravenous Q12H   thiamine  100 mg Oral Daily   traZODone  150 mg Oral QHS    Infusions:  sodium chloride     sodium chloride     amiodarone 30 mg/hr (06/12/22 0800)   cefTRIAXone (ROCEPHIN)  IV     DOBUTamine 2.5 mcg/kg/min (06/12/22 0800)   norepinephrine (LEVOPHED) Adult infusion 1 mcg/min (06/12/22 0800)   potassium chloride Stopped (06/12/22 0742)    PRN Medications: sodium chloride, acetaminophen, alum & mag hydroxide-simeth, guaiFENesin, ondansetron (ZOFRAN) IV, prochlorperazine, sodium chloride flush    Patient Profile   Stanley [name] is a 57 y.o. male with systolic HF due to NICM d/x'd in 2006 (felt 2/2 HTN/ETOH), ? CAD s/p PCI to unknown vessel 2011 at outside location (cath 2014 without CAD and normal coronaries by CT 07/2016), VT s/p [facility] ICD implant ([place] 2015), noncompliance, chronic noncardiac chest pain, depression, HTN and ETOH abuse.  Admitted with VT with ICD shocks in the setting of acute decompensated HF, med noncompliance, ETOH intoxication and electrolyte derangements. AHF to see for acute on chronic systolic heart failure. Also w/ possible aspiration PNA.   Assessment/Plan   1. VT: Shocks from [employer] ICD, now in NSR on amiodarone gtt. No prior chest pain, prior coronary workups have shown no significant coronary disease. HS-TnI mildly elevated with no trend, suspect demand ischemia from volume overload and VT arrest.  Suspect arrest was due to underlying NICM, off meds, with hypokalemia and worsening CHF. No further VT.  - continue amio gtt at 30 hr .  Able to swallow. Transition to po when ok with EP.  - EP following  - Keep K > 4.0 and Mg > 2.0. Supp K  2. Acute on chronic systolic CHF: Patient with history of NICM, last coronary evaluation was CTA in 2018 with normal coronaries and calcium score 0.  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  Most recent prior echo in 2021 with EF 25-30%.  Echo this admission with EF < 20%, mild RV dysfunction, moderate-severe TR, moderate MR. Developed acute pulmonary edema yesterday and diuresed w/ IV Lasix. Now w/ hypotension requiring pressor support and AKI, SCr bumped 1.13>>1.94. Suspect component of septic shock 2/2 PNA. PCT 0.46>>1.95. WBC 10>>11>>12K. On Ceftriaxone + Flagyl.  6/6 RHC RA 10 PCWP 11 Thermo CO 2.9 CI 1.78.Marland Kitchen Started DBA 2.5 mcg. BNP down to 254.  - Now off Norepi. CO_OX  83.5%. Tomorrow start to wean DBA. Pressures look ok.  - Check CVP.  - Hold Losartan, Bidil, Jardiance and Metoprolol  - Not candidate for advanced therapies at this time with heavy ETOH abuse.  3. ETOH abuse: Intoxicated at admission. Heavy ETOH use, likely contributes to cardiomyopathy.  - CIWA protocol.  4. Suspected Aspiration PNA - PCT 0.46>>1.95. WBC 10>>11>>12K. On Ceftriaxone + Flagyl.  5. AKI: SCr peaked at 1.9 --> today 1.46. -Suspect due to hypotension +/- cardiorenal  6. Acute hypoxemic respiratory failure: Diffuse airspace disease in the right lung, suspect aspiration.  - Ceftriaxone+ Flagyl -Weaning oxygen.   Remove femoral a line. Consult PT. OOB.    Length of Stay: 3  Amy Clegg, NP  06/12/2022, 8:39 AM  Advanced Heart Failure Team Pager [phone] (M-F; 7a - 5p)  Please contact [facility] for night-coverage after hours (5p -7a ) and weekends on [email or web address]  Patient seen with NP, agree with the above note.   Still on 10 L HFNC.  CVP not set up.  I/Os net negative.  On dobutamine 2.5 with creatinine trending down 1.46.  Off NE. Co-ox 83%.   Eating breakfast, no complaints.   General: NAD Neck: JVP  8-9 cm, no thyromegaly or thyroid nodule.  Lungs: Clear to auscultation bilaterally with normal respiratory effort. CV: Nondisplaced PMI.  Heart regular S1/S2, no S3/S4, no murmur.  No peripheral edema.   Abdomen: Soft, nontender, no hepatosplenomegaly, no distention.  Skin: Intact without lesions or rashes.  Neurologic: Alert and oriented x 3.  Psych: Normal affect. Extremities: No clubbing or cyanosis.  HEENT: Normal.   Will give 1 dose of Lasix 60 mg IV and set up CVP.  Creatinine down to 1.46.   I think that the main issue here is aspiration PNA in setting of VT and ETOH intoxication.  Remains on high flow oxygen.  - Continue ceftriaxone/Flagyl.  - Repeat CXR.   CRITICAL CARE Performed by: Marca Ancona  Total critical care time: 35 minutes  Critical care time was exclusive of separately billable procedures and treating other patients.  Critical care was necessary to treat or prevent imminent or life-threatening deterioration.  Critical care was time spent personally by me on the following activities: development of treatment plan with patient and/or surrogate as well as nursing, discussions with consultants, evaluation of patient's response to treatment, examination of patient, obtaining history from patient or surrogate, ordering and performing treatments and interventions, ordering and review of laboratory studies, ordering and review of radiographic studies, pulse oximetry and re-evaluation of patient's condition.  Marca Ancona 06/12/2022 9:36 AM

## 2022-06-12 NOTE — Progress Notes (Signed)
Inpatient Rehabilitation Admissions Coordinator   Do not feel patient will be an appropriate CIR candidate unless he has 24/7 supervision assistance after a CIR stay. Noted sister has concerns for patient to return home alone by Case Manager on 6/6. Do not feel he could reach Independent level to discharge home alone. Recommend other rehab venues to be pursued. I will alert TOC.  Ottie Glazier, RN, MSN Rehab Admissions Coordinator [phone] 06/12/2022 9:41 AM

## 2022-06-12 NOTE — TOC Progression Note (Signed)
Transition of Care [facility]) - Progression Note    Patient Details  Name: Stanley [name] MRN: [id number] Date of Birth: [birth year]  Transition [facility]) CM/SW Contact  Elliot Cousin, RN Phone Number: [phone] 06/12/2022, 9:47 AM  Clinical Narrative:   HF TOC CM spoke to pt at bedside. He is agreeable to SNF rehab. Gave permission to create FL2 and fax referral. Currently not medically stable, will need PT/OT updated evaluation closer to dc. Pt lives alone. States his sister assist as needed.     Expected Discharge Plan:  (TBD) Barriers to Discharge: Continued Medical Work up  Expected Discharge Plan and Services   Discharge Planning Services: CM Consult Post Acute Care Choice: IP Rehab Living arrangements for the past 2 months: Single Family Home Expected Discharge Date: 06/11/22                 DME Agency: NA                   Social Determinants of Health (SDOH) Interventions SDOH Screenings   Food Insecurity: Food Insecurity Present (03/21/2018)  Housing: Patient Declined (06/11/2022)  Transportation Needs: No Transportation Needs (06/11/2022)  Utilities: Not At Risk (06/11/2022)  Alcohol Screen: Low Risk  (02/07/2019)  Depression (PHQ2-9): High Risk (02/11/2022)  Financial Resource Strain: Medium Risk (03/21/2018)  Physical Activity: Unknown (03/21/2018)  Social Connections: Unknown (03/21/2018)  Stress: Stress Concern Present (03/21/2018)  Tobacco Use: Low Risk  (06/12/2022)    Readmission Risk Interventions    06/11/2022   11:32 AM  Readmission Risk Prevention Plan  Transportation Screening Complete  HRI or Home Care Consult Complete  Social Work Consult for Recovery Care Planning/Counseling Complete  Palliative Care Screening Not Applicable  Medication Review Oceanographer) Referral to Pharmacy

## 2022-06-12 NOTE — Progress Notes (Signed)
NAME:  Stanley [name], MRN:  [id number], DOB:  10-15-1965, LOS: 3 ADMISSION DATE:  06/08/2022, CONSULTATION DATE:  06/09/22 REFERRING MD:  Pollie Meyer CHIEF COMPLAINT:  Dyspnea   History of Present Illness:  Stanley [name] is a 57 y.o. male who has a PMH as below. He was admitted 6/3 after ICD discharged multiple times at his home. He has both ICD and Barostim devices in place. He was seen by EP and his ICD discharges were found to be 2/2 VT presumed in the setting of medication non-compliance, EtOH intoxication and associated electrolyte derangements.  He was started on Amiodarone and was admitted for further workup. He is being seen by EP as well as AHF.  AM 6/4, he apparently choked on a banana and shortly thereafter, had an episode of desaturation and possibly increased WOB. His O2 via [place] was turned up but did not improve his hypoxia; therefore, he was placed on BiPAP. CXR was obtained and revealed pulmonary edema R > L for which he received Lasix. There is also some concern for possible aspiration.  PCCM called in consultation AM 6/4 for further recs. At the time of our evaluation, pt was on BiPAP and asking why he was still on it. ABG from earlier this morning showed respiratory alkalosis (7.47/28/184).  He tells [place] that he has chronic dysphagia to both solids and liquids and that he has been evaluated by GI in the past along with abdominal pain and bloating. Review of his chart shows that he saw GI in 2022  and had EGD 03/06/21 that demonstrated a 1cm hiatal hernia and he had empiric esophageal dilation using a savary dilator with mild resistance at 17mm. He was started on Zofran and his Protonix was increased from QD to BID dosing. 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 There was no reason to explain his bleeding.   Echo 2018: ef 35-40% 2020: 25-30% 2021; 25-30% 56/4/24: < 25%  Pertinent  Medical  History:  has Nonischemic cardiomyopathy (HCC); Chronic systolic dysfunction of left ventricle; Essential hypertension; Gout; Implantable cardioverter-defibrillator (ICD) in situ; ETOH abuse; AKI (acute kidney injury) (HCC); History of noncompliance with medical treatment, presenting hazards to health; Ventricular tachycardia (HCC); Obstructive sleep apnea; Chronic systolic heart failure (HCC); Moderate episode of recurrent major depressive disorder (HCC); CAD (coronary artery disease); Congestive heart failure (CHF) (HCC); Gastroesophageal reflux disease; Colon cancer screening; Benign neoplasm of colon; Abdominal pain, epigastric; Early satiety; Dysphagia; Hematemesis; ABLA (acute blood loss anemia); Diverticulosis of colon without hemorrhage; Family history of breast cancer; Family history of pancreatic cancer; Genetic testing; AICD discharge; Abdominal pain; Anxiety and depression; Acute hypoxic respiratory failure (HCC); Acute on chronic combined systolic and diastolic CHF (congestive heart failure) (HCC); Cardiogenic shock (HCC); and Alcoholic intoxication without complication (HCC) on their problem list.  Significant Hospital Events: Including procedures, antibiotic start and stop dates in addition to other pertinent events   6/3 admit 6/4 PCCM consult - BiPAP removed, placed on [place] and have asked RT to start HHFNC. He is answering questions appropriately. Says his breathing feels ok. Has ongoing abdominal discomfort but says this is chronic. Admits to chronic dysphagia and admits to choking on a piece of banana earlier. He does not feel that he aspirated and says he was able to cough it up pretty easily.  Interim History / Subjective:  No events, resting comfortably after breakfast. Nursing reports no issues.  Objective:  Blood pressure 113/74, pulse 90, temperature 98.2 F (36.8 C), temperature source Oral, resp. rate 15, height 5\' 5"  (1.651 m), weight 61.9 kg, SpO2 100 %. CVP:  [5 mmHg-31  mmHg] 31 mmHg  FiO2 (%):  [30 %-40 %] 40 %   Intake/Output Summary (Last 24 hours) at 06/12/2022 6578 Last data filed at 06/12/2022 0700 Gross per 24 hour  Intake 1579.42 ml  Output 2800 ml  Net -1220.58 ml    Filed Weights   06/10/22 0130 06/11/22 0500 06/12/22 0426  Weight: 65.5 kg 65 kg 61.9 kg    Examination: Snoring in bed Lungs clear Heart sounds regular Ext warm, trace edema Abd soft, +BS Nonfocal neuro exam per Rns am assessment, I let him rest  SvO2 83% Gtt: Amio 30, Dobut 2.5, levo 2 Last ativan 6/5  Resolved probelms:    Assessment & Plan:   Acute on chronic HFrEF, cardiogenic shock- RHC noted, fick's okay, thermodilution severely down ?discordance VT causing AICD discharges at admission- felt to be 2/2 EtOH and electrolyte derangements. - GDMT per AHF including inotropic support and diuresis - Levophed for MAP 65  Acute hypoxic respiratory failure - presumed after choking event with possible aspiration; though, likely also component of flash pulmonary edema  Likely aspiration pneumonia vs pneumonitis  - ceftriaxone x 7 days - up to chair, IS, wean O2 as tolerated  Acute metabolic encephalopathy likely 2/2 ETOH withdrawal- seems improved, no ativan needed in 24+ hours -CIWA scoring PRN -vitamins -counseling once recovered  Chronic dysphagia with chronic abdominal pain/bloating. - OP f/u -advance diet as tolerated  AKI, improving Hypervolemic hyponatremia Hypokalemia -strict I/O -renally dose meds, avoid nephrotoxic meds -monitor -potassium repletion as ordered  Hyperbilirubinemia and transaminase elevation- likely due to hypoperfusion and congestion vs etoh -monitor intermittently, will check am  Hx of Depression -con't paxil  Macrocytic anemia - check B12, folate  Best practice:  Diet: cardiac Pain/Anxiety/Delirium protocol (if indicated): precedex and CIWA VAP protocol (if indicated): n/a DVT prophylaxis: Lovenox  GI prophylaxis:  ppi Glucose control: ssi Mobility: bed rest Code Status: full code Family Communication: no family at bedside Disposition: ICU  31 min cc time Myrla Halsted MD PCCM

## 2022-06-12 NOTE — NC FL2 (Signed)
MEDICAID [id number] LEVEL OF CARE FORM     IDENTIFICATION  Patient Name: Stanley [name] Birthdate: 11-Nov-1965 Sex: male Admission Date (Current Location): 06/08/2022  [facility] and [place] Number:  Producer, television/film/video and Address:  [facility] [place], [address], [place], [place] [address]      Provider Number: [id number]  Attending Physician Name and Address:  Lorin Glass, MD  Relative Name and Phone Number:  Sagan, Maione [phone]  [phone]    Current Level of Care: Hospital Recommended Level of Care: Skilled Nursing Facility Prior Approval Number:    Date Approved/Denied:   PASRR Number:    Discharge Plan: SNF    Current Diagnoses: Patient Active Problem List   Diagnosis Date Noted   Alcoholic intoxication without complication (HCC) 06/11/2022   Cardiogenic shock (HCC) 06/10/2022   Acute hypoxic respiratory failure (HCC) 06/09/2022   Acute on chronic combined systolic and diastolic CHF (congestive heart failure) (HCC) 06/09/2022   AICD discharge 06/08/2022   Abdominal pain 06/08/2022   Anxiety and depression 06/08/2022   Genetic testing 06/13/2021   Family history of breast cancer 05/30/2021   Family history of pancreatic cancer 05/30/2021   Diverticulosis of colon without hemorrhage    Hematemesis 05/18/2021   ABLA (acute blood loss anemia) 05/18/2021   Benign neoplasm of colon    Abdominal pain, epigastric    Early satiety    Dysphagia    Gastroesophageal reflux disease 04/23/2020   Colon cancer screening 04/23/2020   CAD (coronary artery disease) 07/07/2019   Congestive heart failure (CHF) (HCC) 07/07/2019   Moderate episode of recurrent major depressive disorder (HCC) 01/11/2019   Chronic systolic heart failure (HCC)    Obstructive sleep apnea 08/11/2018   Implantable cardioverter-defibrillator (ICD) in situ 03/21/2018   ETOH abuse 03/21/2018   AKI (acute kidney injury) (HCC) 03/21/2018   History of  noncompliance with medical treatment, presenting hazards to health 03/21/2018   Ventricular tachycardia (HCC) 03/21/2018   Gout 09/25/2015   Nonischemic cardiomyopathy (HCC) 07/03/2015   Chronic systolic dysfunction of left ventricle 07/03/2015   Essential hypertension 07/03/2015    Orientation RESPIRATION BLADDER Height & Weight     Self, Time, Situation, Place  O2 Continent, External catheter Weight: 136 lb 7.4 oz (61.9 kg) Height:  5\' 5"  (165.1 cm)  BEHAVIORAL SYMPTOMS/MOOD NEUROLOGICAL BOWEL NUTRITION STATUS      Continent Diet (see discharge summary)  AMBULATORY STATUS COMMUNICATION OF NEEDS Skin   Total Care Verbally Normal                       Personal Care Assistance Level of Assistance  Bathing, Feeding, Dressing Bathing Assistance: Limited assistance Feeding assistance: Independent Dressing Assistance: Limited assistance     Functional Limitations Info  Sight, Hearing, Speech Sight Info: Adequate Hearing Info: Adequate Speech Info: Adequate    SPECIAL CARE FACTORS FREQUENCY  PT (By licensed PT), OT (By licensed OT)     PT Frequency: 5x week OT Frequency: 5x week            Contractures Contractures Info: Not present    Additional Factors Info  Code Status, Allergies Code Status Info: full Allergies Info: Lisinopril, Allopurinol, Entresto (Sacubitril-valsartan), Other, Shellfish Allergy           Current Medications (06/12/2022):  This is the current hospital active medication list Current Facility-Administered Medications  Medication Dose Route Frequency Provider Last Rate Last Admin   0.9 %  sodium chloride infusion  250 mL Intravenous Continuous Ogan, Okoron[place]wo U, MD       0.9 %  sodium chloride infusion  250 mL Intravenous PRN Laurey Morale, MD       acetaminophen (TYLENOL) tablet 650 mg  650 mg Oral Q4H PRN Laurey Morale, MD       alum & mag hydroxide-simeth (MAALOX/MYLANTA) 200-200-20 MG/5ML suspension 30 mL  30 mL Oral Q4H PRN  Latrelle Dodrill, MD   30 mL at 06/09/22 0654   amiodarone (NEXTERONE PREMIX) 360-4.14 MG/200ML-% (1.8 mg/mL) IV infusion  30 mg/hr Intravenous Continuous Laurey Morale, MD 16.67 mL/hr at 06/12/22 1051 30 mg/hr at 06/12/22 1051   atorvastatin (LIPITOR) tablet 20 mg  20 mg Oral Daily Vonna Drafts, MD   20 mg at 06/12/22 0914   cefTRIAXone (ROCEPHIN) 2 g in sodium chloride 0.9 % 100 mL IVPB  2 g Intravenous Q24H Lorin Glass, MD 200 mL/hr at 06/12/22 0923 2 g at 06/12/22 1610   Chlorhexidine Gluconate Cloth 2 % PADS 6 each  6 each Topical Daily Kalman Shan, MD   6 each at 06/12/22 0914   DOBUTamine (DOBUTREX) infusion 4000 mcg/mL  2.5 mcg/kg/min Intravenous Continuous Laurey Morale, MD 2.44 mL/hr at 06/12/22 0800 2.5 mcg/kg/min at 06/12/22 0800   enoxaparin (LOVENOX) injection 40 mg  40 mg Subcutaneous Q24H Laurey Morale, MD   40 mg at 06/12/22 0900   folic acid (FOLVITE) tablet 1 mg  1 mg Oral Daily Vonna Drafts, MD   1 mg at 06/12/22 0914   guaiFENesin (MUCINEX) 12 hr tablet 600 mg  600 mg Oral BID PRN Evette Georges, MD   600 mg at 06/09/22 0849   lidocaine (LIDODERM) 5 % 1 patch  1 patch Transdermal Q24H Levin Erp, MD   1 patch at 06/11/22 2256   multivitamin with minerals tablet 1 tablet  1 tablet Oral Daily Vonna Drafts, MD   1 tablet at 06/12/22 0914   norepinephrine (LEVOPHED) 16 mg in (0.064 mg/mL) premix infusion  0-40 mcg/min Intravenous Titrated Karie Fetch P, DO 0.94 mL/hr at 06/12/22 0800 1 mcg/min at 06/12/22 0800   ondansetron (ZOFRAN) injection 4 mg  4 mg Intravenous Q6H PRN Laurey Morale, MD       pantoprazole (PROTONIX) EC tablet 40 mg  40 mg Oral Daily Vonna Drafts, MD   40 mg at 06/12/22 0914   PARoxetine (PAXIL) tablet 20 mg  20 mg Oral Daily Vonna Drafts, MD   20 mg at 06/12/22 1100   potassium chloride SA (KLOR-CON M) CR tablet 40 mEq  40 mEq Oral Once Karie Fetch P, DO       prochlorperazine (COMPAZINE) tablet 5 mg  5 mg Oral Q6H PRN  Evette Georges, MD   5 mg at 06/08/22 2205   sodium chloride flush (NS) 0.9 % injection 3 mL  3 mL Intravenous Q12H Robbie Lis M, PA-C   3 mL at 06/11/22 2055   sodium chloride flush (NS) 0.9 % injection 3 mL  3 mL Intravenous Q12H Laurey Morale, MD   3 mL at 06/12/22 1100   sodium chloride flush (NS) 0.9 % injection 3 mL  3 mL Intravenous PRN Laurey Morale, MD       thiamine (VITAMIN B1) tablet 100 mg  100 mg Oral Daily Vonna Drafts, MD   100 mg at 06/12/22 0914   traZODone (DESYREL) tablet 150 mg  150 mg Oral QHS Mabe,  Earvin Hansen, MD   150 mg at 06/11/22 2254     Discharge Medications: Please see discharge summary for a list of discharge medications.  Relevant Imaging Results:  Relevant Lab Results:   Additional Information SSN: [id number]  Lorri [name], LCSW

## 2022-06-12 NOTE — Progress Notes (Signed)
Pt taken off 25L 40% heated HFNC and placed on 10L HFNC by RT. Pt tolerating well at this time, RN aware, CCM aware, vitals stable, RT will monitor as needed.      06/12/22 0810  Therapy Vitals  Pulse Rate (!) 178  Resp 16  MEWS Score/Color  MEWS Score 3  MEWS Score Color Yellow  Oxygen Therapy/Pulse Ox  O2 Device (S)  HFNC (salter)  O2 Therapy Oxygen humidified  O2 Flow Rate (L/min) 10 L/min  SpO2 100 %

## 2022-06-12 NOTE — Progress Notes (Signed)
Physical Therapy Treatment Patient Details Name: Stanley [name] MRN: [id number] DOB: 07/05/1965 Today's Date: 06/12/2022   History of Present Illness Pt is 57 year old presented to [facility] on  06/08/22 for ICD shocks for Vtach and intoxication. On 6/4 developed acute respiratory failure, acute pulmonary edema, and etoh withdrawal. Pt also with acute on chronic heart failure PMH - ETOH abuse, systolic heart failure due to NICM likely due to etoh abuse, ICD, HTN    PT Comments    Pt making good progress with mobility and activity tolerance. Feel patient will benefit from continued inpatient follow up therapy, <3 hours/day    Recommendations for follow up therapy are one component of a multi-disciplinary discharge planning process, led by the attending physician.  Recommendations may be updated based on patient status, additional functional criteria and insurance authorization.  Follow Up Recommendations  Can patient physically be transported by private vehicle: Yes    Assistance Recommended at Discharge Frequent or constant Supervision/Assistance  Patient can return home with the following A little help with walking and/or transfers;A little help with bathing/dressing/bathroom;Assistance with cooking/housework;Direct supervision/assist for financial management;Direct supervision/assist for medications management;Help with stairs or ramp for entrance   Equipment Recommendations  Other (comment) (To be determined at next venue)    Recommendations for Other Services       Precautions / Restrictions Precautions Precautions: Fall Restrictions Weight Bearing Restrictions: No     Mobility  Bed Mobility Overal bed mobility: Needs Assistance Bed Mobility: Supine to Sit     Supine to sit: Min assist     General bed mobility comments: Assist to elevate trunk into sitting and bring hips to EOB    Transfers Overall transfer level: Needs assistance Equipment used: Rolling walker (2  wheels) Transfers: Sit to/from Stand Sit to Stand: Min assist           General transfer comment: Assist to power up and for stability    Ambulation/Gait Ambulation/Gait assistance: Min assist Gait Distance (Feet): 125 Feet Assistive device: Rolling walker (2 wheels) Gait Pattern/deviations: Step-through pattern, Decreased stride length Gait velocity: decr Gait velocity interpretation: 1.31 - 2.62 ft/sec, indicative of limited community ambulator   General Gait Details: Assist for balance and support   Stairs             Wheelchair Mobility    Modified Rankin (Stroke Patients Only)       Balance Overall balance assessment: Needs assistance Sitting-balance support: No upper extremity supported, Feet supported Sitting balance-Leahy Scale: Good     Standing balance support: No upper extremity supported, During functional activity Standing balance-Leahy Scale: Fair                              Cognition Arousal/Alertness: Awake/alert Behavior During Therapy: WFL for tasks assessed/performed Overall Cognitive Status: History of cognitive impairments - at baseline Area of Impairment: Attention, Memory, Safety/judgement, Awareness, Problem solving                   Current Attention Level: Selective Memory: Decreased short-term memory   Safety/Judgement: Decreased awareness of deficits, Decreased awareness of safety Awareness: Emergent Problem Solving: Requires verbal cues          Exercises      General Comments General comments (skin integrity, edema, etc.): VSS on 10L HFNC.      Pertinent Vitals/Pain Pain Assessment Pain Assessment: Faces Faces Pain Scale: Hurts a little bit Pain Location: Lt forearm  Pain Descriptors / Indicators: Grimacing, Guarding Pain Intervention(s): Limited activity within patient's tolerance    Home Living                          Prior Function            PT Goals (current goals  can now be found in the care plan section) Progress towards PT goals: Progressing toward goals    Frequency    Min 1X/week      PT Plan Discharge plan needs to be updated    Co-evaluation              AM-PAC PT "6 Clicks" Mobility   Outcome Measure  Help needed turning from your back to your side while in a flat bed without using bedrails?: A Little Help needed moving from lying on your back to sitting on the side of a flat bed without using bedrails?: A Little Help needed moving to and from a bed to a chair (including a wheelchair)?: A Little Help needed standing up from a chair using your arms (e.g., wheelchair or bedside chair)?: A Little Help needed to walk in hospital room?: A Little Help needed climbing 3-5 steps with a railing? : Total 6 Click Score: 16    End of Session Equipment Utilized During Treatment: Gait belt;Oxygen Activity Tolerance: Patient tolerated treatment well Patient left: in chair;with call bell/phone within reach;with chair alarm set Nurse Communication: Mobility status PT Visit Diagnosis: Unsteadiness on feet (R26.81);Other abnormalities of gait and mobility (R26.89);Muscle weakness (generalized) (M62.81)     Time: 6045-4098 PT Time Calculation (min) (ACUTE ONLY): 27 min  Charges:  $Gait Training: 23-37 mins                     [facility] PT Acute Rehabilitation Services Office [phone]    Angelina Ok [facility] 06/12/2022, 6:18 PM

## 2022-06-12 NOTE — Progress Notes (Signed)
PT Cancellation Note  Patient Details Name: Stanley [name] MRN: [id number] DOB: Aug 27, 1965   Cancelled Treatment:    Reason Eval/Treat Not Completed: (P) Medical issues which prohibited therapy RN messaged MD and preference for femoral A-line removal prior to OOB. PT will follow back for treatment as able.  Arasely Akkerman B. Beverely Risen PT, DPT Acute Rehabilitation Services Please use secure chat or  Call Office [phone]    Elon Alas [facility] 06/12/2022, 8:41 AM

## 2022-06-13 LAB — HEPATIC FUNCTION PANEL
ALT: 72 U/L — ABNORMAL HIGH (ref 0–44)
AST: 44 U/L — ABNORMAL HIGH (ref 15–41)
Albumin: 3 g/dL — ABNORMAL LOW (ref 3.5–5.0)
Alkaline Phosphatase: 79 U/L (ref 38–126)
Bilirubin, Direct: 0.3 mg/dL — ABNORMAL HIGH (ref 0.0–0.2)
Indirect Bilirubin: 1 mg/dL — ABNORMAL HIGH (ref 0.3–0.9)
Total Bilirubin: 1.3 mg/dL — ABNORMAL HIGH (ref 0.3–1.2)
Total Protein: 6.3 g/dL — ABNORMAL LOW (ref 6.5–8.1)

## 2022-06-13 LAB — COOXEMETRY PANEL
Carboxyhemoglobin: 1.5 % (ref 0.5–1.5)
Carboxyhemoglobin: 1.5 % (ref 0.5–1.5)
Methemoglobin: 1.3 % (ref 0.0–1.5)
Methemoglobin: <0.7 % (ref 0.0–1.5)
O2 Saturation: 92.3 %
O2 Saturation: 79.7 %
Total hemoglobin: <6.9 g/dL — CL (ref 12.0–16.0)
Total hemoglobin: 11.7 g/dL — ABNORMAL LOW (ref 12.0–16.0)

## 2022-06-13 LAB — BASIC METABOLIC PANEL
Anion gap: 14 (ref 5–15)
BUN: 20 mg/dL (ref 6–20)
CO2: 27 mmol/L (ref 22–32)
Calcium: 8.7 mg/dL — ABNORMAL LOW (ref 8.9–10.3)
Chloride: 94 mmol/L — ABNORMAL LOW (ref 98–111)
Creatinine, Ser: 1.42 mg/dL — ABNORMAL HIGH (ref 0.61–1.24)
GFR, Estimated: 57 mL/min — ABNORMAL LOW (ref >60)
Glucose, Bld: 80 mg/dL (ref 70–99)
Potassium: 3.2 mmol/L — ABNORMAL LOW (ref 3.5–5.1)
Sodium: 135 mmol/L (ref 135–145)

## 2022-06-13 LAB — CBC
HCT: 30.7 % — ABNORMAL LOW (ref 39.0–52.0)
Hemoglobin: 10.1 g/dL — ABNORMAL LOW (ref 13.0–17.0)
MCH: 34.6 pg — ABNORMAL HIGH (ref 26.0–34.0)
MCHC: 32.9 g/dL (ref 30.0–36.0)
MCV: 105.1 fL — ABNORMAL HIGH (ref 80.0–100.0)
Platelets: 131 10*3/uL — ABNORMAL LOW (ref 150–400)
RBC: 2.92 MIL/uL — ABNORMAL LOW (ref 4.22–5.81)
RDW: 12 % (ref 11.5–15.5)
WBC: 6.7 10*3/uL (ref 4.0–10.5)
nRBC: 0 % (ref 0.0–0.2)

## 2022-06-13 LAB — MAGNESIUM: Magnesium: 1.8 mg/dL (ref 1.7–2.4)

## 2022-06-13 MED ORDER — POTASSIUM CHLORIDE 10 MEQ/50ML IV SOLN
10.0000 meq | INTRAVENOUS | Status: AC
  Administered 2022-06-13 (×6): 10 meq via INTRAVENOUS
  Filled 2022-06-13 (×6): qty 50

## 2022-06-13 MED ORDER — FUROSEMIDE 10 MG/ML IJ SOLN
60.0000 mg | Freq: Once | INTRAMUSCULAR | Status: AC
  Administered 2022-06-13: 60 mg via INTRAVENOUS
  Filled 2022-06-13: qty 6

## 2022-06-13 MED ORDER — SPIRONOLACTONE 12.5 MG HALF TABLET
12.5000 mg | ORAL_TABLET | Freq: Every day | ORAL | Status: DC
  Administered 2022-06-13 – 2022-06-15 (×3): 12.5 mg via ORAL
  Filled 2022-06-13 (×3): qty 1

## 2022-06-13 MED ORDER — MAGNESIUM SULFATE 2 GM/50ML IV SOLN
2.0000 g | Freq: Once | INTRAVENOUS | Status: AC
  Administered 2022-06-13: 2 g via INTRAVENOUS
  Filled 2022-06-13: qty 50

## 2022-06-13 MED ORDER — DIGOXIN 125 MCG PO TABS
0.0625 mg | ORAL_TABLET | Freq: Every day | ORAL | Status: DC
  Administered 2022-06-13 – 2022-06-16 (×4): 0.0625 mg via ORAL
  Filled 2022-06-13 (×4): qty 1

## 2022-06-13 MED ORDER — AMIODARONE HCL 200 MG PO TABS
400.0000 mg | ORAL_TABLET | Freq: Two times a day (BID) | ORAL | Status: DC
  Administered 2022-06-13 – 2022-06-16 (×7): 400 mg via ORAL
  Filled 2022-06-13 (×7): qty 2

## 2022-06-13 NOTE — Progress Notes (Signed)
Pt refused bipap, in no distress on 1L [place]. V60 at bedside on sb if needed.

## 2022-06-13 NOTE — Progress Notes (Signed)
eLink Physician-Brief Progress Note Patient Name: Stanley [name] DOB: 17-Sep-1965 MRN: [id number]   Date of Service  06/13/2022  HPI/Events of Note  potassium 3.2, mag 1.8, cret 1.42 ETOH, CHF, NICM, AICD for VT   eICU Interventions  KCL and Magnesium ordered     Intervention Category Minor Interventions: Electrolytes abnormality - evaluation and management  Elysabeth Aust 06/13/2022, 5:42 AM

## 2022-06-13 NOTE — Progress Notes (Signed)
Pt arrived from ...2H.., A/ox .4..pt denies any pain, MD aware,CCMD called. CHG bath given,no further needs at this time   

## 2022-06-13 NOTE — Progress Notes (Signed)
NAME:  Stanley [name], MRN:  [id number], DOB:  [birth year], LOS: 4 ADMISSION DATE:  06/08/2022, CONSULTATION DATE:  06/09/22 REFERRING MD:  Stanley [name] CHIEF COMPLAINT:  Dyspnea   History of Present Illness:  Stanley [name] is a 57 y.o. male who has a PMH as below. He was admitted 6/3 after ICD discharged multiple times at his home. He has both ICD and Barostim devices in place. He was seen by EP and his ICD discharges were found to be 2/2 VT presumed in the setting of medication non-compliance, EtOH intoxication and associated electrolyte derangements.  He was started on Amiodarone and was admitted for further workup. He is being seen by EP as well as AHF.  AM 6/4, he apparently choked on a banana and shortly thereafter, had an episode of desaturation and possibly increased WOB. His O2 via [place] was turned up but did not improve his hypoxia; therefore, he was placed on BiPAP. CXR was obtained and revealed pulmonary edema R > L for which he received Lasix. There is also some concern for possible aspiration.  PCCM called in consultation AM 6/4 for further recs. At the time of our evaluation, pt was on BiPAP and asking why he was still on it. ABG from earlier this morning showed respiratory alkalosis (7.47/28/184).  He tells [place] that he has chronic dysphagia to both solids and liquids and that he has been evaluated by GI in the past along with abdominal pain and bloating. Review of his chart shows that he saw GI in 2022  and had EGD 03/06/21 that demonstrated a 1cm hiatal hernia and he had empiric esophageal dilation using a savary dilator with mild resistance at 17mm. He was started on Zofran and his Protonix was increased from QD to BID dosing. 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 There was no reason to explain his bleeding.   Echo 2018: ef 35-40% 2020: 25-30% 2021; 25-30% 56/4/24: < 25%  Pertinent  Medical  History:  has Nonischemic cardiomyopathy (HCC); Chronic systolic dysfunction of left ventricle; Essential hypertension; Gout; Implantable cardioverter-defibrillator (ICD) in situ; ETOH abuse; AKI (acute kidney injury) (HCC); History of noncompliance with medical treatment, presenting hazards to health; Ventricular tachycardia (HCC); Obstructive sleep apnea; Chronic systolic heart failure (HCC); Moderate episode of recurrent major depressive disorder (HCC); CAD (coronary artery disease); Congestive heart failure (CHF) (HCC); Gastroesophageal reflux disease; Colon cancer screening; Benign neoplasm of colon; Abdominal pain, epigastric; Early satiety; Dysphagia; Hematemesis; ABLA (acute blood loss anemia); Diverticulosis of colon without hemorrhage; Family history of breast cancer; Family history of pancreatic cancer; Genetic testing; AICD discharge; Abdominal pain; Anxiety and depression; Acute hypoxic respiratory failure (HCC); Acute on chronic combined systolic and diastolic CHF (congestive heart failure) (HCC); Cardiogenic shock (HCC); and Alcoholic intoxication without complication (HCC) on their problem list.  Significant Hospital Events: Including procedures, antibiotic start and stop dates in addition to other pertinent events   6/3 admit 6/4 PCCM consult - BiPAP removed, placed on [place] and have asked RT to start HHFNC. He is answering questions appropriately. Says his breathing feels ok. Has ongoing abdominal discomfort but says this is chronic. Admits to chronic dysphagia and admits to choking on a piece of banana earlier. He does not feel that he aspirated and says he was able to cough it up pretty easily.  Interim History / Subjective:  No events. Denies pain. Occasional nausea. Breathing is okay.  Objective:  Blood  pressure 113/70, pulse 80, temperature 98.3 F (36.8 C), temperature source Oral, resp. rate 11, height 5\' 5"  (1.651 m), weight 61.7 kg, SpO2 100 %.    FiO2 (%):  [40 %] 40 %    Intake/Output Summary (Last 24 hours) at 06/13/2022 0745 Last data filed at 06/13/2022 0700 Gross per 24 hour  Intake 1059.68 ml  Output 3000 ml  Net -1940.32 ml    Filed Weights   06/11/22 0500 06/12/22 0426 06/13/22 0500  Weight: 65 kg 61.9 kg 61.7 kg    Examination: No distress Moves to command Ext warm Heart sounds regular Sinus on monitor Abd soft  SvO2 high on 2.5 dobut Cr stable Mag/K replaced LFTs improved B12 ok  Resolved probelms:    Assessment & Plan:   Acute on chronic HFrEF, cardiogenic shock- RHC noted, fick's okay, thermodilution severely down ?discordance VT causing AICD discharges at admission- felt to be 2/2 EtOH and electrolyte derangements. - GDMT per AHF including inotropic support and diuresis  Acute hypoxic respiratory failure - presumed after choking event with possible aspiration; though, likely also component of flash pulmonary edema  Likely aspiration pneumonia vs pneumonitis  - ceftriaxone x 7 days - up to chair, IS, wean O2 as tolerated  Acute metabolic encephalopathy likely 2/2 ETOH withdrawal- seems improved, no ativan needed in 48+ hours -CIWA scoring PRN -vitamins -counseling once recovered  Chronic dysphagia with chronic abdominal pain/bloating. - OP f/u -advance diet as tolerated  AKI, improving Hypervolemic hyponatremia Hypokalemia -strict I/O -renally dose meds, avoid nephrotoxic meds -monitor -potassium repletion as ordered  Hyperbilirubinemia and transaminase elevation- likely due to hypoperfusion and congestion vs etoh  Hx of Depression -con't paxil  Macrocytic anemia - check B12, folate  Best practice:  Diet: cardiac Pain/Anxiety/Delirium protocol (if indicated): precedex and CIWA VAP protocol (if indicated): n/a DVT prophylaxis: Lovenox  GI prophylaxis: ppi Glucose control: ssi Mobility: bed rest Code Status: full code Family Communication: patient updated Disposition: ICU  Remains off pressors,  stable inotropes, okay for progressive transfer; issues remaining are (A) GDMT per AHF (B) PT/OT  Myrla Halsted MD PCCM

## 2022-06-13 NOTE — Progress Notes (Signed)
   06/13/22 2100  BiPAP/CPAP/SIPAP  BiPAP/CPAP/SIPAP Pt Type Adult  Reason BIPAP/CPAP not in use Other(comment) (Pt refused bipap, says he's fine on 1L oxygen.)  Flow Rate 1 lpm

## 2022-06-13 NOTE — Progress Notes (Signed)
Patient ID: Stanley [name], male   DOB: [birth year], 57 y.o.   MRN: [id number]     Advanced Heart Failure Rounding Note  PCP-Cardiologist: Arvilla Meres, MD   Subjective:    6/3: Admitted w/ ICD shocks for VT + intoxication and metabolic derangement (K 3.1, Mg 1.2)  6/4: Developed acute respiratory failure, acute pulmonary edema ? Aspiration PNA. Worsened overnight w/ hypotension, transferred to ICU and started on pressors. Also w/ ETOH withdrawal 6/6: RHC RA 10 PCWP 11 CO 3.87 CI 2.25 (Fick), CI 1.78 (thermo), PVR 5.9   Remains on 5L HFNC.  Off NE, now on dobutamine 2.5.  Co-ox inaccurate.  CVP 9.  I/Os -1940 yesterday.    PCT 0.46>>1.95. WBC 10>>11>>12>>6.7K. On Ceftriaxone for suspected aspiration PNA. Afebrile   On amiodarone gtt, no VT.   Denies SOB.   Objective:   Weight Range: 61.7 kg Body mass index is 22.64 kg/m.   Vital Signs:   Temp:  [98 F (36.7 C)-98.5 F (36.9 C)] 98 F (36.7 C) (06/08 0800) Pulse Rate:  [70-97] 85 (06/08 0800) Resp:  [11-29] 17 (06/08 0800) BP: (94-126)/(53-88) 100/76 (06/08 0800) SpO2:  [93 %-100 %] 100 % (06/08 0800) Weight:  [61.7 kg] 61.7 kg (06/08 0500) Last BM Date : 06/11/22  Weight change: Filed Weights   06/11/22 0500 06/12/22 0426 06/13/22 0500  Weight: 65 kg 61.9 kg 61.7 kg    Intake/Output:   Intake/Output Summary (Last 24 hours) at 06/13/2022 0825 Last data filed at 06/13/2022 0800 Gross per 24 hour  Intake 1201.69 ml  Output 2950 ml  Net -1748.31 ml      Physical Exam  General: NAD Neck: JVP 8-9 cm, no thyromegaly or thyroid nodule.  Lungs: Clear to auscultation bilaterally with normal respiratory effort. CV: Lateral PMI.  Heart regular S1/S2, no S3/S4, 2/6 HSM LLSB/apex.  No peripheral edema.  Abdomen: Soft, nontender, no hepatosplenomegaly, no distention.  Skin: Intact without lesions or rashes.  Neurologic: Alert and oriented x 3.  Psych: Normal affect. Extremities: No clubbing or cyanosis.  HEENT:  Normal.    Telemetry   SR 80s, personally reviewed.   EKG    No new EKG to review   Labs    CBC Recent Labs    06/12/22 0214 06/13/22 0317  WBC 8.2 6.7  HGB 11.5* 10.1*  HCT 34.2* 30.7*  MCV 104.3* 105.1*  PLT 125* 131*   Basic Metabolic Panel Recent Labs    40/98/11 1107 06/11/22 1700 06/12/22 0334 06/13/22 0428  NA  --    < > 133* 135  K  --    < > 3.2* 3.2*  CL  --   --  100 94*  CO2  --   --  23 27  GLUCOSE  --   --  104* 80  BUN  --   --  22* 20  CREATININE  --    < > 1.46* 1.42*  CALCIUM  --   --  8.3* 8.7*  MG  --   --  2.1 1.8  PHOS 3.8  --  4.6  --    < > = values in this interval not displayed.   Liver Function Tests Recent Labs    06/13/22 0428  AST 44*  ALT 72*  ALKPHOS 79  BILITOT 1.3*  PROT 6.3*  ALBUMIN 3.0*   No results for input(s): "LIPASE", "AMYLASE" in the last 72 hours.  Cardiac Enzymes No results for input(s): "CKTOTAL", "CKMB", "CKMBINDEX", "TROPONINI" in the  last 72 hours.  BNP: BNP (last 3 results) Recent Labs    06/10/22 0552 06/12/22 0214  BNP 2,797.6* 254.6*    ProBNP (last 3 results) No results for input(s): "PROBNP" in the last 8760 hours.   D-Dimer No results for input(s): "DDIMER" in the last 72 hours. Hemoglobin A1C No results for input(s): "HGBA1C" in the last 72 hours.  Fasting Lipid Panel No results for input(s): "CHOL", "HDL", "LDLCALC", "TRIG", "CHOLHDL", "LDLDIRECT" in the last 72 hours. Thyroid Function Tests No results for input(s): "TSH", "T4TOTAL", "T3FREE", "THYROIDAB" in the last 72 hours.  Invalid input(s): "FREET3"  Other results:   Imaging    DG CHEST PORT 1 VIEW  Result Date: 06/12/2022 CLINICAL DATA:  Pneumonia EXAM: PORTABLE CHEST 1 VIEW COMPARISON:  Chest x-ray June 5, 24. FINDINGS: Enlarged cardiopericardial silhouette. Right IJ approach catheter with the tip projecting in the region of the upper SVC. No consolidation. No visible pleural effusion or pneumothorax. Left  subclavian approach cardiac rhythm maintenance device in place. Right neck nerve stimulator partially imaged. IMPRESSION: Cardiomegaly.  Otherwise, no evidence acute cardiopulmonary disease. Electronically Signed   By: Feliberto Harts M.D.   On: 06/12/2022 14:13     Medications:     Scheduled Medications:  amiodarone  400 mg Oral BID   atorvastatin  20 mg Oral Daily   Chlorhexidine Gluconate Cloth  6 each Topical Daily   digoxin  0.0625 mg Oral Daily   enoxaparin (LOVENOX) injection  40 mg Subcutaneous Q24H   folic acid  1 mg Oral Daily   furosemide  60 mg Intravenous Once   lidocaine  1 patch Transdermal Q24H   multivitamin with minerals  1 tablet Oral Daily   pantoprazole  40 mg Oral Daily   PARoxetine  20 mg Oral Daily   potassium chloride  40 mEq Oral Once   sodium chloride flush  3 mL Intravenous Q12H   sodium chloride flush  3 mL Intravenous Q12H   spironolactone  12.5 mg Oral Daily   thiamine  100 mg Oral Daily   traZODone  150 mg Oral QHS    Infusions:  sodium chloride 10 mL/hr at 06/13/22 0800   sodium chloride     cefTRIAXone (ROCEPHIN)  IV Stopped (06/12/22 4782)   DOBUTamine 2.5 mcg/kg/min (06/13/22 0800)   norepinephrine (LEVOPHED) Adult infusion Stopped (06/12/22 0851)   potassium chloride Stopped (06/13/22 0758)    PRN Medications: sodium chloride, acetaminophen, alum & mag hydroxide-simeth, guaiFENesin, ondansetron (ZOFRAN) IV, prochlorperazine, sodium chloride flush    Patient Profile   Stanley [name] is a 57 y.o. male with systolic HF due to NICM d/x'd in 2006 (felt 2/2 HTN/ETOH), ? CAD s/p PCI to unknown vessel 2011 at outside location (cath 2014 without CAD and normal coronaries by CT 07/2016), VT s/p [facility] ICD implant ([place] 2015), noncompliance, chronic noncardiac chest pain, depression, HTN and ETOH abuse.  Admitted with VT with ICD shocks in the setting of acute decompensated HF, med noncompliance, ETOH intoxication and electrolyte derangements.  AHF to see for acute on chronic systolic heart failure. Also w/ possible aspiration PNA.   Assessment/Plan   1. VT: Shocks from [employer] ICD, now in NSR on amiodarone gtt. No prior chest pain, prior coronary workups have shown no significant coronary disease. HS-TnI mildly elevated with no trend, suspect demand ischemia from volume overload and VT arrest.  Suspect arrest was due to underlying NICM, off meds (had been on amiodarone in remote past), with hypokalemia and worsening  CHF. No further VT.  - Transition amiodarone to 400 mg bid.  - Keep K > 4.0 and Mg > 2.0. Supp K and Mg this morning.  2. Acute on chronic systolic CHF: Patient with history of NICM, last coronary evaluation was CTA in 2018 with normal coronaries and calcium score 0.  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  Most recent prior echo in 2021 with EF 25-30%.  Echo this admission with EF < 20%, mild RV dysfunction, moderate-severe TR, moderate MR. Developed acute pulmonary edema yesterday and diuresed w/ IV Lasix. Now w/ hypotension requiring pressor support and AKI, SCr bumped 1.13>>1.94. Suspect component of septic shock 2/2 PNA. PCT 0.46>>1.95. WBC 10>>11>>12>>6.7K. On Ceftriaxone. 6/6 RHC RA 10 PCWP 11 Thermo CO 2.9 CI 1.78. Started DBA 2.5 mcg. CVP today 9, co-ox inaccurate.  Now off norepinephrine, remains on dobutamine. Still 5L HFNC.  - Lasix 60 mg IV x 1 today.  - Start spironolactone 12.5 daily.  - Start digoxin 0.0625 daily (was on in the past).  - Continue dobutamine today, repeat co-ox.  Would start to wean off tomorrow if looks euvolemic.  - Not candidate for advanced therapies at this time with heavy ETOH abuse.  3. ETOH abuse: Intoxicated at admission. Heavy ETOH use, likely contributes to cardiomyopathy.  - CIWA protocol.  4. Acute hypoxemic respiratory failure: Suspect aspiration PNA, diffuse airspace disease right lung on initial CXR.  PCT up to 1.95.  Now afebrile with normal WBCs.  Still on HFNC 5L.  -  Continue ceftriaxone.  - Wean oxygen.  5. AKI: SCr peaked at 1.9 --> today 1.42. - Suspect due to hypotension +/- cardiorenal   Mobilize.   CRITICAL CARE Performed by: Marca Ancona  Total critical care time: 35 minutes  Critical care time was exclusive of separately billable procedures and treating other patients.  Critical care was necessary to treat or prevent imminent or life-threatening deterioration.  Critical care was time spent personally by me on the following activities: development of treatment plan with patient and/or surrogate as well as nursing, discussions with consultants, evaluation of patient's response to treatment, examination of patient, obtaining history from patient or surrogate, ordering and performing treatments and interventions, ordering and review of laboratory studies, ordering and review of radiographic studies, pulse oximetry and re-evaluation of patient's condition.  Marca Ancona 06/13/2022 8:25 AM

## 2022-06-14 LAB — BASIC METABOLIC PANEL
Anion gap: 11 (ref 5–15)
BUN: 17 mg/dL (ref 6–20)
CO2: 26 mmol/L (ref 22–32)
Calcium: 8.6 mg/dL — ABNORMAL LOW (ref 8.9–10.3)
Chloride: 97 mmol/L — ABNORMAL LOW (ref 98–111)
Creatinine, Ser: 1.24 mg/dL (ref 0.61–1.24)
GFR, Estimated: >60 mL/min (ref >60)
Glucose, Bld: 78 mg/dL (ref 70–99)
Potassium: 3.1 mmol/L — ABNORMAL LOW (ref 3.5–5.1)
Sodium: 134 mmol/L — ABNORMAL LOW (ref 135–145)

## 2022-06-14 LAB — COOXEMETRY PANEL
Carboxyhemoglobin: 2.5 % — ABNORMAL HIGH (ref 0.5–1.5)
Methemoglobin: <0.7 % (ref 0.0–1.5)
O2 Saturation: 76.2 %
Total hemoglobin: 12.2 g/dL (ref 12.0–16.0)

## 2022-06-14 LAB — CBC
HCT: 34.9 % — ABNORMAL LOW (ref 39.0–52.0)
Hemoglobin: 11.7 g/dL — ABNORMAL LOW (ref 13.0–17.0)
MCH: 34.7 pg — ABNORMAL HIGH (ref 26.0–34.0)
MCHC: 33.5 g/dL (ref 30.0–36.0)
MCV: 103.6 fL — ABNORMAL HIGH (ref 80.0–100.0)
Platelets: 176 10*3/uL (ref 150–400)
RBC: 3.37 MIL/uL — ABNORMAL LOW (ref 4.22–5.81)
RDW: 11.5 % (ref 11.5–15.5)
WBC: 8.1 10*3/uL (ref 4.0–10.5)
nRBC: 0 % (ref 0.0–0.2)

## 2022-06-14 LAB — MAGNESIUM: Magnesium: 2 mg/dL (ref 1.7–2.4)

## 2022-06-14 MED ORDER — POTASSIUM CHLORIDE CRYS ER 20 MEQ PO TBCR
30.0000 meq | EXTENDED_RELEASE_TABLET | Freq: Two times a day (BID) | ORAL | Status: DC
  Administered 2022-06-14 – 2022-06-15 (×3): 30 meq via ORAL
  Filled 2022-06-14 (×3): qty 1

## 2022-06-14 MED ORDER — FUROSEMIDE 40 MG PO TABS
40.0000 mg | ORAL_TABLET | Freq: Every day | ORAL | Status: DC
  Administered 2022-06-14 – 2022-06-15 (×2): 40 mg via ORAL
  Filled 2022-06-14 (×2): qty 1

## 2022-06-14 NOTE — Progress Notes (Addendum)
PROGRESS NOTE    Stanley [name]  ZOX:[id number] DOB: 06-19-1965 DOA: 06/08/2022 PCP: [name], NP     Brief Narrative:  Patient is a 57 year old male with a past medical history of HFrEF, alcohol abuse, nonischemic cardiomyopathy, anxiety/depression who presented on 6/3 after his ICD discharge multiple times at home.  EP determined this was due to VT in setting of medical noncompliance, electrolyte issues, and alcohol intoxication.  He was started on amiodarone.  On 6/4, he choked on a banana and desaturated.  He was thus placed on BiPAP, chest x-ray showed pulmonary edema and diuresis was started.  Blood pressure dropped overnight and started on pressors, transferred to the ICU.  On 6-6, he had a right heart catheterization.  Over the next few days, he was weaned from Levophed and transferred to the floor on 6/8 still on dobutamine.   New events last 24 hours / Subjective: Patient reports feeling lightheaded when he stands up.  Breathing is better.  He is not on oxygen at home.  No chest pain, palpitations, fevers.  Assessment & Plan:   Principal Problem:   AICD discharge  Appreciate cardiology team  Telemetry  Amiodarone 400 mg twice daily   Active Problems:   Acute on chronic combined systolic and diastolic CHF (congestive heart failure) (HCC) 2/2 nonischemic cardiomyopathy  Appreciate cardiology recommendations  Digoxin 0.0625 mg daily  Spironolactone 12.5 mg daily  Holding beta-blocker due to blood pressure     Cardiogenic shock (HCC)  As above  CCM stopped dobutamine today (06/14/22)    ETOH abuse  No signs of withdrawal  Folate, MV and B1 supplementation    Abdominal pain  Currently resolved    Anxiety and depression  Continue Paxil 20 mg daily  Continue trazodone 150 mg nightly    Acute hypoxemic respiratory failure (HCC) secondary to pneumonia  Continue supplemental oxygen, wean as able  Continue Rocephin 2 g daily   DVT prophylaxis: Lovenox Code  Status: Full Family Communication: None bedside Coming From: Home Disposition Plan: SNF Barriers to Discharge: Clinical improvement  Consultants:  Cardiology CCM  Procedures:  RHC- 06/11/22  Antimicrobials:  Anti-infectives (From admission, onward)    Start     Dose/Rate Route Frequency Ordered Stop   06/12/22 0930  cefTRIAXone (ROCEPHIN) 2 g in sodium chloride 0.9 % 100 mL IVPB        2 g 200 mL/hr over 30 Minutes Intravenous Every 24 hours 06/12/22 0813 06/17/22 0929   06/10/22 0930  cefTRIAXone (ROCEPHIN) 2 g in sodium chloride 0.9 % 100 mL IVPB  Status:  Discontinued        2 g 200 mL/hr over 30 Minutes Intravenous Every 24 hours 06/10/22 0833 06/12/22 0813   06/10/22 0930  metroNIDAZOLE (FLAGYL) IVPB 500 mg  Status:  Discontinued        500 mg 100 mL/hr over 60 Minutes Intravenous Every 12 hours 06/10/22 0833 06/12/22 0811   06/10/22 0415  Ampicillin-Sulbactam (UNASYN) 3 g in sodium chloride 0.9 % 100 mL IVPB  Status:  Discontinued        3 g 200 mL/hr over 30 Minutes Intravenous Every 6 hours 06/10/22 0322 06/10/22 0833        Objective: Vitals:   06/14/22 0340 06/14/22 0533 06/14/22 0748 06/14/22 0922  BP: 121/79  104/65 103/73  Pulse: 71  77 92  Resp: 16  15 19   Temp: 98.2 F (36.8 C)  98.6 F (37 C)   TempSrc: Oral  Oral  SpO2: 100%  98% 100%  Weight:  66.9 kg    Height:        Intake/Output Summary (Last 24 hours) at 06/14/2022 1104 Last data filed at 06/14/2022 0357 Gross per 24 hour  Intake 417.48 ml  Output 1425 ml  Net -1007.52 ml   Filed Weights   06/12/22 0426 06/13/22 0500 06/14/22 0533  Weight: 61.9 kg 61.7 kg 66.9 kg    Examination:  General exam: Appears calm and comfortable  Respiratory system: Clear to auscultation. Respiratory effort normal. No respiratory distress. No conversational dyspnea.  Cardiovascular system: S1 & S2 heard, RRR. No murmurs. No pedal edema. Gastrointestinal system: Abdomen is nondistended, soft and nontender.  Normal bowel sounds heard. Central nervous system: Alert and oriented. No focal neurological deficits. Speech clear.  Extremities: Symmetric in appearance  Skin: No rashes, lesions or ulcers on exposed skin  Psychiatry: Judgement and insight appear normal. Mood & affect appropriate.   Data Reviewed: I have personally reviewed following labs and imaging studies  CBC: Recent Labs  Lab 06/08/22 1132 06/08/22 1206 06/11/22 0349 06/11/22 1700 06/11/22 1734 06/12/22 0214 06/13/22 0317 06/14/22 0345  WBC 4.4   < > 12.4*  --  10.0 8.2 6.7 8.1  NEUTROABS 3.2  --   --   --   --   --   --   --   HGB 11.9*   < > 12.4* 13.3  12.9* 12.6* 11.5* 10.1* 11.7*  HCT 35.2*   < > 36.2* 39.0  38.0* 38.2* 34.2* 30.7* 34.9*  MCV 105.4*   < > 102.0*  --  102.7* 104.3* 105.1* 103.6*  PLT 156   < > 163  --  161 125* 131* 176   < > = values in this interval not displayed.   Basic Metabolic Panel: Recent Labs  Lab 06/10/22 0522 06/10/22 0552 06/11/22 0349 06/11/22 1107 06/11/22 1700 06/11/22 1734 06/12/22 0334 06/13/22 0428 06/14/22 0345  NA 132*  --  134*  --  136  137  --  133* 135 134*  K 4.5  --  3.2*  --  4.2  4.2  --  3.2* 3.2* 3.1*  CL 96*  --  101  --   --   --  100 94* 97*  CO2 19*  --  23  --   --   --  23 27 26   GLUCOSE 124*  --  130*  --   --   --  104* 80 78  BUN 15  --  19  --   --   --  22* 20 17  CREATININE 1.94*  --  1.78*  --   --  1.68* 1.46* 1.42* 1.24  CALCIUM 8.8*  --  8.2*  --   --   --  8.3* 8.7* 8.6*  MG  --  3.4* 2.9*  --   --   --  2.1 1.8 2.0  PHOS  --  1.1*  --  3.8  --   --  4.6  --   --    GFR: Estimated Creatinine Clearance: 56.5 mL/min (by C-G formula based on SCr of 1.24 mg/dL).  Liver Function Tests: Recent Labs  Lab 06/08/22 1326 06/09/22 0128 06/10/22 0552 06/13/22 0428  AST 312* 235* 124* 44*  ALT 174* 177* 129* 72*  ALKPHOS 72 102 82 79  BILITOT 1.6* 2.1* 2.0* 1.3*  PROT 6.1* 6.9 6.3* 6.3*  ALBUMIN 3.3* 3.6 3.0* 3.0*   Recent Labs  Lab  06/08/22 1326  LIPASE 60*   Coagulation Profile: Recent Labs  Lab 06/09/22 0128  INR 1.0   CBG: Recent Labs  Lab 06/08/22 1304 06/08/22 1353 06/09/22 2301 06/10/22 0138  GLUCAP 52* 84 171* 162*   Anemia Panel: Recent Labs    06/12/22 1118  VITAMINB12 421   Sepsis Labs: Recent Labs  Lab 06/09/22 0936 06/10/22 0552 06/10/22 1535 06/11/22 0349  PROCALCITON 0.46  --   --  1.95  LATICACIDVEN  --  2.6* 1.7  --     Recent Results (from the past 240 hour(s))  MRSA Next Gen by PCR, Nasal     Status: None   Collection Time: 06/10/22  1:40 AM   Specimen: Nasal Mucosa; Nasal Swab  Result Value Ref Range Status   MRSA by PCR Next Gen NOT DETECTED NOT DETECTED Final    Comment: (NOTE) The GeneXpert MRSA Assay (FDA approved for NASAL specimens only), is one component of a comprehensive MRSA colonization surveillance program. It is not intended to diagnose MRSA infection nor to guide or monitor treatment for MRSA infections. Test performance is not FDA approved in patients less than 34 years old. Performed at [facility], [address] [place], [place] [address]   Surgical PCR screen     Status: None   Collection Time: 06/10/22  3:40 PM   Specimen: Nasal Mucosa; Nasal Swab  Result Value Ref Range Status   MRSA, PCR NEGATIVE NEGATIVE Final   Staphylococcus aureus NEGATIVE NEGATIVE Final    Comment: (NOTE) The Xpert SA Assay (FDA approved for NASAL specimens in patients 77 years of age and older), is one component of a comprehensive surveillance program. It is not intended to diagnose infection nor to guide or monitor treatment. Performed at [facility], [address], [place], [place] [address]     Scheduled Meds:  amiodarone  400 mg Oral BID   atorvastatin  20 mg Oral Daily   Chlorhexidine Gluconate Cloth  6 each Topical Daily   digoxin  0.0625 mg Oral Daily   enoxaparin (LOVENOX) injection  40 mg Subcutaneous Q24H   folic acid  1 mg Oral Daily    lidocaine  1 patch Transdermal Q24H   multivitamin with minerals  1 tablet Oral Daily   pantoprazole  40 mg Oral Daily   PARoxetine  20 mg Oral Daily   potassium chloride  40 mEq Oral Once   sodium chloride flush  3 mL Intravenous Q12H   sodium chloride flush  3 mL Intravenous Q12H   spironolactone  12.5 mg Oral Daily   thiamine  100 mg Oral Daily   traZODone  150 mg Oral QHS   Continuous Infusions:  sodium chloride Stopped (06/13/22 1355)   sodium chloride     cefTRIAXone (ROCEPHIN)  IV 2 g (06/14/22 0928)   DOBUTamine 2.5 mcg/kg/min (06/14/22 0357)   norepinephrine (LEVOPHED) Adult infusion Stopped (06/12/22 0851)     LOS: 5 days    Time spent: 40 minutes   Sharlene Dory, DO [facility] 06/14/2022, 11:04 AM   Available via Epic secure chat 7am-7pm After these hours, please refer to coverage provider listed on [email or web address]

## 2022-06-14 NOTE — Progress Notes (Signed)
Patient ID: Johnpeter Gleissner, male   DOB: 09-22-1965, 57 y.o.   MRN: [id number]     Advanced Heart Failure Rounding Note  PCP-Cardiologist: Arvilla Meres, MD   Subjective:    6/3: Admitted w/ ICD shocks for VT + intoxication and metabolic derangement (K 3.1, Mg 1.2)  6/4: Developed acute respiratory failure, acute pulmonary edema ? Aspiration PNA. Worsened overnight w/ hypotension, transferred to ICU and started on pressors. Also w/ ETOH withdrawal 6/6: RHC RA 10 PCWP 11 CO 3.87 CI 2.25 (Fick), CI 1.78 (thermo), PVR 5.9   - On [place] today; no complaints. Slept well.  - 2.3 L urine output  - JVP 8cm.   Objective:   Weight Range: 66.9 kg Body mass index is 24.54 kg/m.   Vital Signs:   Temp:  [97.6 F (36.4 C)-98.6 F (37 C)] 98.5 F (36.9 C) (06/09 1246) Pulse Rate:  [71-92] 79 (06/09 1246) Resp:  [15-21] 20 (06/09 1246) BP: (103-123)/(65-79) 116/79 (06/09 1246) SpO2:  [91 %-100 %] 99 % (06/09 1246) Weight:  [66.9 kg] 66.9 kg (06/09 0533) Last BM Date : 06/14/22  Weight change: Filed Weights   06/12/22 0426 06/13/22 0500 06/14/22 0533  Weight: 61.9 kg 61.7 kg 66.9 kg    Intake/Output:   Intake/Output Summary (Last 24 hours) at 06/14/2022 1325 Last data filed at 06/14/2022 0830 Gross per 24 hour  Intake 717.69 ml  Output 1425 ml  Net -707.31 ml       Physical Exam  General: NAD Neck: JVP 8cm Lungs: Clear to auscultation bilaterally with normal respiratory effort. CV: Lateral PMI.  Heart regular S1/S2, no S3/S4, 2/6 HSM LLSB/apex.  No peripheral edema.  Abdomen: Soft, nontender, no hepatosplenomegaly, no distention.  Skin: Intact without lesions or rashes.  Neurologic: Alert and oriented x 3.  Psych: Normal affect. Extremities: No clubbing or cyanosis. No edema.  HEENT: Normal.    Telemetry   SR 80s, personally reviewed.   EKG    No new EKG to review   Labs    CBC Recent Labs    06/13/22 0317 06/14/22 0345  WBC 6.7 8.1  HGB 10.1* 11.7*  HCT  30.7* 34.9*  MCV 105.1* 103.6*  PLT 131* 176    Basic Metabolic Panel Recent Labs    40/98/11 0334 06/13/22 0428 06/14/22 0345  NA 133* 135 134*  K 3.2* 3.2* 3.1*  CL 100 94* 97*  CO2 23 27 26   GLUCOSE 104* 80 78  BUN 22* 20 17  CREATININE 1.46* 1.42* 1.24  CALCIUM 8.3* 8.7* 8.6*  MG 2.1 1.8 2.0  PHOS 4.6  --   --     Liver Function Tests Recent Labs    06/13/22 0428  AST 44*  ALT 72*  ALKPHOS 79  BILITOT 1.3*  PROT 6.3*  ALBUMIN 3.0*    No results for input(s): "LIPASE", "AMYLASE" in the last 72 hours.  Cardiac Enzymes No results for input(s): "CKTOTAL", "CKMB", "CKMBINDEX", "TROPONINI" in the last 72 hours.  BNP: BNP (last 3 results) Recent Labs    06/10/22 0552 06/12/22 0214  BNP 2,797.6* 254.6*     ProBNP (last 3 results) No results for input(s): "PROBNP" in the last 8760 hours.   D-Dimer No results for input(s): "DDIMER" in the last 72 hours. Hemoglobin A1C No results for input(s): "HGBA1C" in the last 72 hours.  Fasting Lipid Panel No results for input(s): "CHOL", "HDL", "LDLCALC", "TRIG", "CHOLHDL", "LDLDIRECT" in the last 72 hours. Thyroid Function Tests No results for input(s): "  TSH", "T4TOTAL", "T3FREE", "THYROIDAB" in the last 72 hours.  Invalid input(s): "FREET3"  Other results:   Imaging    No results found.   Medications:     Scheduled Medications:  amiodarone  400 mg Oral BID   atorvastatin  20 mg Oral Daily   Chlorhexidine Gluconate Cloth  6 each Topical Daily   digoxin  0.0625 mg Oral Daily   enoxaparin (LOVENOX) injection  40 mg Subcutaneous Q24H   folic acid  1 mg Oral Daily   lidocaine  1 patch Transdermal Q24H   multivitamin with minerals  1 tablet Oral Daily   pantoprazole  40 mg Oral Daily   PARoxetine  20 mg Oral Daily   potassium chloride  40 mEq Oral Once   sodium chloride flush  3 mL Intravenous Q12H   sodium chloride flush  3 mL Intravenous Q12H   spironolactone  12.5 mg Oral Daily   thiamine  100  mg Oral Daily   traZODone  150 mg Oral QHS    Infusions:  sodium chloride Stopped (06/13/22 1355)   sodium chloride     cefTRIAXone (ROCEPHIN)  IV 2 g (06/14/22 0928)    PRN Medications: sodium chloride, acetaminophen, alum & mag hydroxide-simeth, guaiFENesin, ondansetron (ZOFRAN) IV, prochlorperazine, sodium chloride flush    Patient Profile   Huey Scalia is a 57 y.o. male with systolic HF due to NICM d/x'd in 2006 (felt 2/2 HTN/ETOH), ? CAD s/p PCI to unknown vessel 2011 at outside location (cath 2014 without CAD and normal coronaries by CT 07/2016), VT s/p [facility] ICD implant ([place] 2015), noncompliance, chronic noncardiac chest pain, depression, HTN and ETOH abuse.  Admitted with VT with ICD shocks in the setting of acute decompensated HF, med noncompliance, ETOH intoxication and electrolyte derangements. AHF to see for acute on chronic systolic heart failure. Also w/ possible aspiration PNA.   Assessment/Plan   1. VT: Shocks from [employer] ICD, now in NSR on amiodarone gtt. No prior chest pain, prior coronary workups have shown no significant coronary disease. HS-TnI mildly elevated with no trend, suspect demand ischemia from volume overload and VT arrest.  Suspect arrest was due to underlying NICM, off meds (had been on amiodarone in remote past), with hypokalemia and worsening CHF. No further VT.  - Continue amiodarone to 400 mg bid.  - Keep K > 4.0 and Mg > 2.0. Supp K and Mg this morning.  2. Acute on chronic systolic CHF: Patient with history of NICM, last coronary evaluation was CTA in 2018 with normal coronaries and calcium score 0.  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  Most recent prior echo in 2021 with EF 25-30%.  Echo this admission with EF < 20%, mild RV dysfunction, moderate-severe TR, moderate MR. Developed acute pulmonary edema yesterday and diuresed w/ IV Lasix. Now w/ hypotension requiring pressor support and AKI, SCr bumped 1.13>>1.94. Suspect component of  septic shock 2/2 PNA. PCT 0.46>>1.95. WBC 10>>11>>12>>6.7K. On Ceftriaxone. 6/6 RHC RA 10 PCWP 11 Thermo CO 2.9 CI 1.78.  - continue spironolactone 12.5 daily.  - continue digoxin 0.0625 daily (was on in the past).  - D/C dobutamine; mixed venous 76%. Euvolemic on exam. Start lasix 40mg  daily.  - Not candidate for advanced therapies at this time with heavy ETOH abuse.  3. ETOH abuse: Intoxicated at admission. Heavy ETOH use, likely contributes to cardiomyopathy.  - CIWA protocol.  4. Acute hypoxemic respiratory failure: Suspect aspiration PNA, diffuse airspace disease right lung on initial CXR.  PCT up to 1.95.  Now afebrile with normal WBCs.  Still on HFNC 5L.  - Continue ceftriaxone.  - Wean oxygen.  5. AKI: SCr peaked at 1.9  - sCr 1.2 today.     Shambria Camerer 06/14/2022 1:25 PM

## 2022-06-14 NOTE — Progress Notes (Signed)
   06/14/22 2151  BiPAP/CPAP/SIPAP  BiPAP/CPAP/SIPAP Pt Type Adult  BiPAP/CPAP/SIPAP V60  Reason BIPAP/CPAP not in use Non-compliant

## 2022-06-15 ENCOUNTER — Other Ambulatory Visit (HOSPITAL_COMMUNITY): Payer: Self-pay

## 2022-06-15 DIAGNOSIS — I5023 Acute on chronic systolic (congestive) heart failure: Secondary | ICD-10-CM

## 2022-06-15 LAB — FOLATE RBC
Folate, Hemolysate: 284 ng/mL
Folate, RBC: 853 ng/mL (ref >498)
Hematocrit: 33.3 % — ABNORMAL LOW (ref 37.5–51.0)

## 2022-06-15 LAB — BASIC METABOLIC PANEL
Anion gap: 10 (ref 5–15)
BUN: 19 mg/dL (ref 6–20)
CO2: 27 mmol/L (ref 22–32)
Calcium: 9.3 mg/dL (ref 8.9–10.3)
Chloride: 99 mmol/L (ref 98–111)
Creatinine, Ser: 1.33 mg/dL — ABNORMAL HIGH (ref 0.61–1.24)
GFR, Estimated: >60 mL/min (ref >60)
Glucose, Bld: 97 mg/dL (ref 70–99)
Potassium: 3.8 mmol/L (ref 3.5–5.1)
Sodium: 136 mmol/L (ref 135–145)

## 2022-06-15 LAB — COOXEMETRY PANEL
Carboxyhemoglobin: 2.9 % — ABNORMAL HIGH (ref 0.5–1.5)
Methemoglobin: <0.7 % (ref 0.0–1.5)
O2 Saturation: 84.1 %
Total hemoglobin: 13.3 g/dL (ref 12.0–16.0)

## 2022-06-15 LAB — CBC
HCT: 38.1 % — ABNORMAL LOW (ref 39.0–52.0)
Hemoglobin: 12.6 g/dL — ABNORMAL LOW (ref 13.0–17.0)
MCH: 34 pg (ref 26.0–34.0)
MCHC: 33.1 g/dL (ref 30.0–36.0)
MCV: 102.7 fL — ABNORMAL HIGH (ref 80.0–100.0)
Platelets: 228 10*3/uL (ref 150–400)
RBC: 3.71 MIL/uL — ABNORMAL LOW (ref 4.22–5.81)
RDW: 11.4 % — ABNORMAL LOW (ref 11.5–15.5)
WBC: 8.7 10*3/uL (ref 4.0–10.5)
nRBC: 0 % (ref 0.0–0.2)

## 2022-06-15 LAB — MAGNESIUM: Magnesium: 1.9 mg/dL (ref 1.7–2.4)

## 2022-06-15 MED ORDER — EMPAGLIFLOZIN 10 MG PO TABS
10.0000 mg | ORAL_TABLET | Freq: Every day | ORAL | Status: DC
  Administered 2022-06-16: 10 mg via ORAL
  Filled 2022-06-15: qty 1

## 2022-06-15 MED ORDER — SPIRONOLACTONE 25 MG PO TABS
25.0000 mg | ORAL_TABLET | Freq: Every day | ORAL | Status: DC
  Administered 2022-06-16: 25 mg via ORAL
  Filled 2022-06-15: qty 1

## 2022-06-15 NOTE — TOC Progression Note (Signed)
Transition of Care [facility]) - Progression Note    Patient Details  Name: Maison Kestenbaum MRN: [id number] Date of Birth: 10-Dec-1965  Transition of Care [facility]) CM/SW Contact  Eduard Roux, [place] Phone Number: 06/15/2022, 12:11 PM  Clinical Narrative:     Pasrr still pending   Expected Discharge Plan:  (TBD) Barriers to Discharge: Continued Medical Work up, SNF Pending bed offer, Awaiting State Approval Financial controller)  Expected Discharge Plan and Services   Discharge Planning Services: CM Consult Post Acute Care Choice: IP Rehab Living arrangements for the past 2 months: Single Family Home Expected Discharge Date: 06/11/22                 DME Agency: NA                   Social Determinants of Health (SDOH) Interventions SDOH Screenings   Food Insecurity: Food Insecurity Present (03/21/2018)  Housing: Patient Declined (06/11/2022)  Transportation Needs: No Transportation Needs (06/11/2022)  Utilities: Not At Risk (06/11/2022)  Alcohol Screen: Low Risk  (02/07/2019)  Depression (PHQ2-9): High Risk (02/11/2022)  Financial Resource Strain: Medium Risk (03/21/2018)  Physical Activity: Unknown (03/21/2018)  Social Connections: Unknown (03/21/2018)  Stress: Stress Concern Present (03/21/2018)  Tobacco Use: Low Risk  (06/12/2022)    Readmission Risk Interventions    06/11/2022   11:32 AM  Readmission Risk Prevention Plan  Transportation Screening Complete  HRI or Home Care Consult Complete  Social Work Consult for Recovery Care Planning/Counseling Complete  Palliative Care Screening Not Applicable  Medication Review Oceanographer) Referral to Pharmacy

## 2022-06-15 NOTE — Hospital Course (Addendum)
57 year old male with a past medical history of HFrEF, alcohol abuse, nonischemic cardiomyopathy, anxiety/depression who presented on 6/3 after his ICD discharge multiple times at home.  Seen in ED, EP determined this was due to VT in setting of medical noncompliance, electrolyte issues, and alcohol intoxication.  Patient was admitted,started on amiodarone. On 6/4, he choked on a banana and desaturated.  He was thus placed on BiPAP, chest x-ray showed pulmonary edema and diuresis was started.  Blood pressure dropped overnight and started on pressors, transferred to the ICU>On 6/6, he had a right heart catheterization. Over the next few days, he was weaned from Levophed and transferred to the floor on 6/8 still on dobutamine that was off 6/9. Being followed by advanced heart failure team and EP cardiology. Patient has been transitioned to oral amiodarone. PICC line removed 6/10 and cleared for discharge by cardiology.  He remains medically stable,eager le to go home today, TOC arranged prescription as well as a pill box, reinforced the need to comply with medications

## 2022-06-15 NOTE — TOC Progression Note (Addendum)
Transition of Care [facility]) - Progression Note    Patient Details  Name: Stanley [name] MRN: [id number] Date of Birth: [birth year]  [facility]) CM/SW Contact  Elliot Cousin, RN Phone Number: [phone] 06/15/2022, 2:18 PM  Clinical Narrative:   HF TOC CM received message that pt's dtr and sister has questions about SNF rehab. They were requesting [facility] SNF rehab. Reviewed with family PT recommending HH and pt wants to go home. Offered choice for [facility] and dtr requesting [facility]. Referral called to [employer], Kandee Keen. Frances Furbish rep accepted referral, states they had patient in the past. Pt will have to agree to receiving [facility] services. CM spoke to pt and discussed adherence to treatment plan. Dtr will follow up on helping pt apply for Medicaid. Pt will have to pay out of pocket for RW, does not have Medicare B. Will need HH RN and PT orders with [facility].   Contacted [employer] rep, and they will supply pt a cane for home. Pt will have to pay $84 RW or $160 RW with seat.   Meds covered under pt's Med D, request meds come up from [facility] pharmacy prior to dc.     Expected Discharge Plan: Home w Home Health Services Barriers to Discharge: Continued Medical Work up  Expected Discharge Plan and Services   Discharge Planning Services: CM Consult Post Acute Care Choice: Home Health Living arrangements for the past 2 months: Apartment Expected Discharge Date: 06/11/22                 DME Agency: NA       HH Arranged: RN, PT HH Agency: [facility] Date [facility] Agency Contacted: 06/15/22 Time HH Agency Contacted: [phone] Representative spoke with at [facility] Agency: Lorenza Chick   Social Determinants of Health (SDOH) Interventions SDOH Screenings   Food Insecurity: Food Insecurity Present (03/21/2018)  Housing: Patient Declined (06/11/2022)  Transportation Needs: No Transportation Needs (06/11/2022)  Utilities: Not At Risk (06/11/2022)  Alcohol Screen: Low Risk  (02/07/2019)  Depression  (PHQ2-9): High Risk (02/11/2022)  Financial Resource Strain: Medium Risk (03/21/2018)  Physical Activity: Unknown (03/21/2018)  Social Connections: Unknown (03/21/2018)  Stress: Stress Concern Present (03/21/2018)  Tobacco Use: Low Risk  (06/12/2022)    Readmission Risk Interventions    06/11/2022   11:32 AM  Readmission Risk Prevention Plan  Transportation Screening Complete  HRI or Home Care Consult Complete  Social Work Consult for Recovery Care Planning/Counseling Complete  Palliative Care Screening Not Applicable  Medication Review Oceanographer) Referral to Pharmacy

## 2022-06-15 NOTE — Progress Notes (Signed)
Physical Therapy Treatment Patient Details Name: Stanley [name] MRN: [id number] DOB: 09-17-1965 Today's Date: 06/15/2022   History of Present Illness Pt is 57 year old presented to [name] on  06/08/22 for ICD shocks for Vtach and intoxication. On 6/4 developed acute respiratory failure, acute pulmonary edema, and etoh withdrawal. Pt also with acute on chronic heart failure PMH - ETOH abuse, systolic heart failure due to NICM likely due to etoh abuse, ICD, HTN    PT Comments    Patient doing well with mobility this date. Continued with mild memory deficits (did not recall his room number when out walking and did not recall PT had asked him to sit up in the chair when we returned to room). Interested for OT to further assess cognition with consideration for ?HH therapies. Noted that sister wants pt to go to SNF, however pt has significantly improved and appears pt has capacity to make his own decision. He wants to go home.     Recommendations for follow up therapy are one component of a multi-disciplinary discharge planning process, led by the attending physician.  Recommendations may be updated based on patient status, additional functional criteria and insurance authorization.  Follow Up Recommendations  Can patient physically be transported by private vehicle: Yes    Assistance Recommended at Discharge Set up Supervision/Assistance  Patient can return home with the following A little help with walking and/or transfers;A little help with bathing/dressing/bathroom;Assistance with cooking/housework;Direct supervision/assist for financial management;Direct supervision/assist for medications management;Help with stairs or ramp for entrance   Equipment Recommendations  None recommended by PT    Recommendations for Other Services Rehab consult     Precautions / Restrictions Precautions Precautions: Fall Restrictions Weight Bearing Restrictions: No     Mobility  Bed Mobility Overal bed  mobility: Needs Assistance Bed Mobility: Sidelying to Sit   Sidelying to sit: Min guard       General bed mobility comments: close supervision as coming up from Epley maneuver    Transfers Overall transfer level: Needs assistance Equipment used: None, Rolling walker (2 wheels) Transfers: Sit to/from Stand Sit to Stand: Min guard           General transfer comment: no assist; guarding for safety due to recent dizziness; from EOB w/ RW from toilet no AD    Ambulation/Gait Ambulation/Gait assistance: Min guard Gait Distance (Feet): 400 Feet Assistive device: None Gait Pattern/deviations: Step-through pattern, Decreased stride length   Gait velocity interpretation: >2.62 ft/sec, indicative of community ambulatory   General Gait Details: straight path walking, change in speeds without imbalance; head turns with slower speed, smaller steps and slight drift; no physical assist for balance needed   Stairs             Wheelchair Mobility    Modified Rankin (Stroke Patients Only)       Balance Overall balance assessment: Needs assistance Sitting-balance support: No upper extremity supported, Feet supported Sitting balance-Leahy Scale: Good     Standing balance support: No upper extremity supported, During functional activity Standing balance-Leahy Scale: Fair                              Cognition Arousal/Alertness: Awake/alert Behavior During Therapy: WFL for tasks assessed/performed Overall Cognitive Status: No family/caregiver present to determine baseline cognitive functioning Area of Impairment: Attention, Memory, Safety/judgement, Awareness                   Current  Attention Level: Selective Memory: Decreased short-term memory   Safety/Judgement: Decreased awareness of deficits, Decreased awareness of safety Awareness: Emergent            Exercises      General Comments General comments (skin integrity, edema, etc.): Pt  reported he has been getting dizzy with changes in position. Lasting seconds and resolves. Describes it as blurry vision. Assessed Lt and Rt Dix-Hallpike with +symptoms on Rt (although no nystagmus), resolved in <30 sec. Completed Epley to treat Rt post canal wiht +symptoms each step.      Pertinent Vitals/Pain Pain Assessment Pain Assessment: Faces Faces Pain Scale: No hurt    Home Living                          Prior Function            PT Goals (current goals can now be found in the care plan section) Acute Rehab PT Goals Patient Stated Goal: to go home Time For Goal Achievement: 06/23/22 Potential to Achieve Goals: Fair Progress towards PT goals: Progressing toward goals    Frequency    Min 1X/week      PT Plan Discharge plan needs to be updated    Co-evaluation              AM-PAC PT "6 Clicks" Mobility   Outcome Measure  Help needed turning from your back to your side while in a flat bed without using bedrails?: A Little Help needed moving from lying on your back to sitting on the side of a flat bed without using bedrails?: A Little Help needed moving to and from a bed to a chair (including a wheelchair)?: A Little Help needed standing up from a chair using your arms (e.g., wheelchair or bedside chair)?: A Little Help needed to walk in hospital room?: A Little Help needed climbing 3-5 steps with a railing? : A Little 6 Click Score: 18    End of Session Equipment Utilized During Treatment: Gait belt Activity Tolerance: Patient tolerated treatment well Patient left: in chair;with call bell/phone within reach;with chair alarm set   PT Visit Diagnosis: Unsteadiness on feet (R26.81);Other abnormalities of gait and mobility (R26.89);Muscle weakness (generalized) (M62.81)     Time: 1610-9604 PT Time Calculation (min) (ACUTE ONLY): 38 min  Charges:  $Gait Training: 23-37 mins                      Stanley [name], PT Acute Rehabilitation Services   Office [phone]    Stanley [name] 06/15/2022, 12:41 PM

## 2022-06-15 NOTE — Progress Notes (Signed)
PROGRESS NOTE Stanley [name]  ZOX:[id number] DOB: 31-Mar-1965 DOA: 06/08/2022 PCP: [name], NP  Brief Narrative/Hospital Course: 57 year old male with a past medical history of HFrEF, alcohol abuse, nonischemic cardiomyopathy, anxiety/depression who presented on 6/3 after his ICD discharge multiple times at home.  Seen in ED, EP determined this was due to VT in setting of medical noncompliance, electrolyte issues, and alcohol intoxication.  Patient was admitted,started on amiodarone. On 6/4, he choked on a banana and desaturated.  He was thus placed on BiPAP, chest x-ray showed pulmonary edema and diuresis was started.  Blood pressure dropped overnight and started on pressors, transferred to the ICU>On 6/6, he had a right heart catheterization. Over the next few days, he was weaned from Levophed and transferred to the floor on 6/8 still on dobutamine that was off 6/9. Being followed by advanced heart failure team and EP cardiology. Patient has been transition to oral amiodarone    Subjective: Seen and examined this morning resting comfortably, PICC line in place on right knee, no nausea vomiting chest pain shortness of breath. He reports he does not feel ready for discharge today   Assessment and Plan: Principal Problem:   AICD discharge Active Problems:   Acute hypoxemic respiratory failure (HCC)   ETOH abuse   Nonischemic cardiomyopathy (HCC)   Chronic systolic heart failure (HCC)   Abdominal pain   Anxiety and depression   Acute on chronic combined systolic and diastolic CHF (congestive heart failure) (HCC)   Cardiogenic shock (HCC)   Alcoholic intoxication without complication (HCC)   VT with ICD shock: In the setting of noncompliance, EtOH abuse, hypokalemia, ?worsening CHF, managed by EP cardiology reloaded on IV amiodarone transition to p.o. 6/9 no PT last 24 hours. Recs- Amiodarone 400mg  BID x1 week >> 200mg  BID x1 week 200mg  daily.  Acute on chronic systolic CHF Acute  pulmonary edema 6/8 History of NICM: ast coronary evaluation with CTA to PRN negative: Status post right heart cath 6/6 with echo showing EF less than 20% RV dysfunction moderate-severe TR. advanced heart failure team following. GDMT with Aldactone, digoxin.  Off dobutamine 6/9.  Not a candidate for advanced therapies at this time with heavy alcohol abuse.  On Lasix p.o.  Hypotension/shock combination of cardiogenic shock/pneumonia: Patient needing pressors with dobutamine.  Currently vital stable.  Currently no leukocytosis, afebrile procalcitonin peaked to 1 1.9 on 6/6.  Patient was treated with Unasyn 6/4>> ceftriaxone 6/5>>, Flagyl 6/5>> 6/7.  Complete total 7 days of antibiotics  Acute hypoxic respiratory failure: Currently resolved on room air AKI: Currently creatinine at 1.3, peaked to 1.9 Alcohol abuse: Continue thiamine folate no signs of withdrawal.,  TSH consult for resources Anxiety/depression-on Paxil, trazodone Abdominal pain currently resolved Hyperlipidemia on Lipitor. Hypokalemia replace and keep above 4 Atypical chest pain in the setting of ICD firing. Noncompliance with medication patient has been counseled.  DVT prophylaxis: enoxaparin (LOVENOX) injection 40 mg Start: 06/12/22 0800 SCD's Start: 06/11/22 1818 Code Status:   Code Status: Full Code Family Communication: plan of care discussed with patient at bedside. Patient status is:  inpatient  because of CT w/ ICD shock Level of care: Progressive   Dispo: The patient is from: home            Anticipated disposition: home /w HH in 24 hrs  Objective: Vitals last 24 hrs: Vitals:   06/15/22 0444 06/15/22 0728 06/15/22 0946 06/15/22 1215  BP: 116/76 105/68 109/71 110/69  Pulse: 82 70 75 75  Resp: 20  18 16  Temp: 98.1 F (36.7 C) 98 F (36.7 C)  98.4 F (36.9 C)  TempSrc: Oral Oral  Oral  SpO2: 100% 100%  95%  Weight: 62.7 kg     Height:       Weight change: -4.213 kg  Physical Examination: General exam:  alert awake, older than stated age HEENT:Oral mucosa moist, Ear/Nose WNL grossly Respiratory system: bilaterally clear BS, no use of accessory muscle Cardiovascular system: S1 & S2 +, No JVD. Gastrointestinal system: Abdomen soft,NT,ND, BS+ Nervous System:Alert, awake, moving extremities. Extremities: LE edema neg,distal peripheral pulses palpable.  Skin: No rashes,no icterus. MSK: Normal muscle bulk,tone, power  Medications reviewed:  Scheduled Meds:  amiodarone  400 mg Oral BID   atorvastatin  20 mg Oral Daily   Chlorhexidine Gluconate Cloth  6 each Topical Daily   digoxin  0.0625 mg Oral Daily   [START ON 06/16/2022] empagliflozin  10 mg Oral Daily   enoxaparin (LOVENOX) injection  40 mg Subcutaneous Q24H   folic acid  1 mg Oral Daily   lidocaine  1 patch Transdermal Q24H   multivitamin with minerals  1 tablet Oral Daily   pantoprazole  40 mg Oral Daily   PARoxetine  20 mg Oral Daily   potassium chloride  30 mEq Oral BID   sodium chloride flush  3 mL Intravenous Q12H   sodium chloride flush  3 mL Intravenous Q12H   [START ON 06/16/2022] spironolactone  25 mg Oral Daily   thiamine  100 mg Oral Daily   traZODone  150 mg Oral QHS  Continuous Infusions:  sodium chloride Stopped (06/13/22 1355)   sodium chloride     cefTRIAXone (ROCEPHIN)  IV 2 g (06/15/22 0959)    Diet Order             Diet Heart Room service appropriate? Yes; Fluid consistency: Thin  Diet effective now                  Intake/Output Summary (Last 24 hours) at 06/15/2022 1315 Last data filed at 06/14/2022 1800 Gross per 24 hour  Intake 340 ml  Output 600 ml  Net -260 ml   Net IO Since Admission: -4,994.19 mL [06/15/22 1315]  Wt Readings from Last 3 Encounters:  06/15/22 62.7 kg  02/11/22 73.7 kg  10/24/21 72.6 kg    Unresulted Labs (From admission, onward)     Start     Ordered   06/18/22 0500  Creatinine, serum  (enoxaparin (LOVENOX)    CrCl >/= 30 ml/min)  Weekly,   R     Comments: while on  enoxaparin therapy   Question:  Specimen collection method  Answer:  Unit=Unit collect   06/11/22 1712   06/12/22 0813  Folate RBC  Add-on,   AD       Question:  Specimen collection method  Answer:  Unit=Unit collect   06/12/22 0812   06/12/22 0500  Lipoprotein A (LPA)  Tomorrow morning,   R       Question:  Specimen collection method  Answer:  Unit=Unit collect   06/11/22 1817   06/11/22 0500  Cooxemetry Panel (carboxy, met, total hgb, O2 sat)  Daily,   R     Question:  Specimen collection method  Answer:  Lab=Lab collect   06/10/22 1204   06/10/22 0500  Basic metabolic panel  Daily,   R      06/09/22 1026   06/10/22 0500  Magnesium  Daily,   R  06/09/22 1026   06/10/22 0500  CBC  Daily,   R      06/09/22 1026          Data Reviewed: I have personally reviewed following labs and imaging studies CBC: Recent Labs  Lab 06/11/22 1734 06/12/22 0214 06/13/22 0317 06/14/22 0345 06/15/22 0555  WBC 10.0 8.2 6.7 8.1 8.7  HGB 12.6* 11.5* 10.1* 11.7* 12.6*  HCT 38.2* 34.2* 30.7* 34.9* 38.1*  MCV 102.7* 104.3* 105.1* 103.6* 102.7*  PLT 161 125* 131* 176 228   Basic Metabolic Panel: Recent Labs  Lab 06/10/22 0552 06/11/22 0349 06/11/22 1107 06/11/22 1700 06/11/22 1734 06/12/22 0334 06/13/22 0428 06/14/22 0345 06/15/22 0555  NA  --  134*  --  136  137  --  133* 135 134* 136  K  --  3.2*  --  4.2  4.2  --  3.2* 3.2* 3.1* 3.8  CL  --  101  --   --   --  100 94* 97* 99  CO2  --  23  --   --   --  23 27 26 27   GLUCOSE  --  130*  --   --   --  104* 80 78 97  BUN  --  19  --   --   --  22* 20 17 19   CREATININE  --  1.78*  --   --  1.68* 1.46* 1.42* 1.24 1.33*  CALCIUM  --  8.2*  --   --   --  8.3* 8.7* 8.6* 9.3  MG 3.4* 2.9*  --   --   --  2.1 1.8 2.0 1.9  PHOS 1.1*  --  3.8  --   --  4.6  --   --   --    GFR: Estimated Creatinine Clearance: 52.7 mL/min (A) (by C-G formula based on SCr of 1.33 mg/dL (H)). Liver Function Tests: Recent Labs  Lab 06/08/22 1326  06/09/22 0128 06/10/22 0552 06/13/22 0428  AST 312* 235* 124* 44*  ALT 174* 177* 129* 72*  ALKPHOS 72 102 82 79  BILITOT 1.6* 2.1* 2.0* 1.3*  PROT 6.1* 6.9 6.3* 6.3*  ALBUMIN 3.3* 3.6 3.0* 3.0*   Recent Labs  Lab 06/08/22 1326  LIPASE 60*   Recent Labs  Lab 06/09/22 0128  INR 1.0  CBG: Recent Labs  Lab 06/08/22 1353 06/09/22 2301 06/10/22 0138  GLUCAP 84 171* 162*   Recent Labs  Lab 06/09/22 0936 06/10/22 0552 06/10/22 1535 06/11/22 0349  PROCALCITON 0.46  --   --  1.95  LATICACIDVEN  --  2.6* 1.7  --    Recent Results (from the past 240 hour(s))  MRSA Next Gen by PCR, Nasal     Status: None   Collection Time: 06/10/22  1:40 AM   Specimen: Nasal Mucosa; Nasal Swab  Result Value Ref Range Status   MRSA by PCR Next Gen NOT DETECTED NOT DETECTED Final    Comment: (NOTE) The GeneXpert MRSA Assay (FDA approved for NASAL specimens only), is one component of a comprehensive MRSA colonization surveillance program. It is not intended to diagnose MRSA infection nor to guide or monitor treatment for MRSA infections. Test performance is not FDA approved in patients less than 59 years old. Performed at [facility], [address], [place], [place] [address]   Surgical PCR screen     Status: None   Collection Time: 06/10/22  3:40 PM   Specimen: Nasal Mucosa; Nasal Swab  Result Value Ref Range Status   MRSA, PCR NEGATIVE NEGATIVE Final   Staphylococcus aureus NEGATIVE NEGATIVE Final    Comment: (NOTE) The Xpert SA Assay (FDA approved for NASAL specimens in patients 35 years of age and older), is one component of a comprehensive surveillance program. It is not intended to diagnose infection nor to guide or monitor treatment. Performed at [facility], [address] [place], [place] [address]   Antimicrobials: Anti-infectives (From admission, onward)    Start     Dose/Rate Route Frequency Ordered Stop   06/12/22 0930  cefTRIAXone (ROCEPHIN) 2 g in  sodium chloride 0.9 % 100 mL IVPB        2 g 200 mL/hr over 30 Minutes Intravenous Every 24 hours 06/12/22 0813 06/17/22 0929   06/10/22 0930  cefTRIAXone (ROCEPHIN) 2 g in sodium chloride 0.9 % 100 mL IVPB  Status:  Discontinued        2 g 200 mL/hr over 30 Minutes Intravenous Every 24 hours 06/10/22 0833 06/12/22 0813   06/10/22 0930  metroNIDAZOLE (FLAGYL) IVPB 500 mg  Status:  Discontinued        500 mg 100 mL/hr over 60 Minutes Intravenous Every 12 hours 06/10/22 0833 06/12/22 0811   06/10/22 0415  Ampicillin-Sulbactam (UNASYN) 3 g in sodium chloride 0.9 % 100 mL IVPB  Status:  Discontinued        3 g 200 mL/hr over 30 Minutes Intravenous Every 6 hours 06/10/22 0322 06/10/22 0833     Culture/Microbiology    Component Value Date/Time   SDES  12/28/2018 1542    IN/OUT CATH URINE Performed at [facility], [address] [place], [place] [address]    SPECREQUEST  12/28/2018 1542    NONE Performed at [facility], [address], [place], [place] [address]    CULT  12/28/2018 1542    NO GROWTH Performed at [facility], [address] [place], [place] [address]    REPTSTATUS 12/30/2018 FINAL 12/28/2018 1542   Radiology Studies: No results found.   LOS: 6 days   Lanae Boast, MD [facility]  06/15/2022, 1:15 PM

## 2022-06-15 NOTE — Progress Notes (Addendum)
Rounding Note    Patient Name: Stanley [name] Date of Encounter: 06/15/2022  [facility] Cardiologist: Arvilla Meres, MD   Subjective   Feels well, hopes to go home soon  Inpatient Medications    Scheduled Meds:  amiodarone  400 mg Oral BID   atorvastatin  20 mg Oral Daily   Chlorhexidine Gluconate Cloth  6 each Topical Daily   digoxin  0.0625 mg Oral Daily   enoxaparin (LOVENOX) injection  40 mg Subcutaneous Q24H   folic acid  1 mg Oral Daily   furosemide  40 mg Oral Daily   lidocaine  1 patch Transdermal Q24H   multivitamin with minerals  1 tablet Oral Daily   pantoprazole  40 mg Oral Daily   PARoxetine  20 mg Oral Daily   potassium chloride  30 mEq Oral BID   sodium chloride flush  3 mL Intravenous Q12H   sodium chloride flush  3 mL Intravenous Q12H   spironolactone  12.5 mg Oral Daily   thiamine  100 mg Oral Daily   traZODone  150 mg Oral QHS   Continuous Infusions:  sodium chloride Stopped (06/13/22 1355)   sodium chloride     cefTRIAXone (ROCEPHIN)  IV 2 g (06/14/22 0928)   PRN Meds: sodium chloride, acetaminophen, alum & mag hydroxide-simeth, guaiFENesin, ondansetron (ZOFRAN) IV, prochlorperazine, sodium chloride flush   Vital Signs    Vitals:   06/14/22 2000 06/14/22 2226 06/14/22 2304 06/15/22 0444  BP:  102/70 102/73 116/76  Pulse:  75 77 82  Resp: 14 13 15 20   Temp:  98.6 F (37 C) 98.3 F (36.8 C) 98.1 F (36.7 C)  TempSrc:  Oral Oral Oral  SpO2:  100% 100% 100%  Weight:    62.7 kg  Height:        Intake/Output Summary (Last 24 hours) at 06/15/2022 0719 Last data filed at 06/14/2022 1800 Gross per 24 hour  Intake 700 ml  Output 1200 ml  Net -500 ml      06/15/2022    4:44 AM 06/14/2022    5:33 AM 06/13/2022    5:00 AM  Last 3 Weights  Weight (lbs) 138 lb 3.2 oz 147 lb 7.8 oz 136 lb 0.4 oz  Weight (kg) 62.687 kg 66.9 kg 61.7 kg      Telemetry    SR 60's - Personally Reviewed  ECG    No new EKGs - Personally  Reviewed  Physical Exam   GEN: No acute distress.   Neck: No JVD Cardiac: RRR, 2-3/6 SM, rubs, or gallops.  Respiratory: CTA b/l. GI: Soft, nontender, non-distended  MS: No edema; No deformity. Neuro:  Nonfocal  Psych: Normal affect   Labs    High Sensitivity Troponin:   Recent Labs  Lab 06/08/22 1410 06/08/22 1814 06/08/22 2020 06/09/22 0749 06/09/22 0936  TROPONINIHS 229* 304* 223* 72* 79*     Chemistry Recent Labs  Lab 06/09/22 0128 06/09/22 2355 06/10/22 0552 06/11/22 0349 06/13/22 0428 06/14/22 0345 06/15/22 0555  NA 133*   < >  --    < > 135 134* 136  K 4.1   < >  --    < > 3.2* 3.1* 3.8  CL 101   < >  --    < > 94* 97* 99  CO2 18*   < >  --    < > 27 26 27   GLUCOSE 152*   < >  --    < > 80  78 97  BUN 11   < >  --    < > 20 17 19   CREATININE 1.13   < >  --    < > 1.42* 1.24 1.33*  CALCIUM 8.8*   < >  --    < > 8.7* 8.6* 9.3  MG 1.8   < > 3.4*   < > 1.8 2.0 1.9  PROT 6.9  --  6.3*  --  6.3*  --   --   ALBUMIN 3.6  --  3.0*  --  3.0*  --   --   AST 235*  --  124*  --  44*  --   --   ALT 177*  --  129*  --  72*  --   --   ALKPHOS 102  --  82  --  79  --   --   BILITOT 2.1*  --  2.0*  --  1.3*  --   --   GFRNONAA >60   < >  --    < > 57* >60 >60  ANIONGAP 14   < >  --    < > 14 11 10    < > = values in this interval not displayed.    Lipids No results for input(s): "CHOL", "TRIG", "HDL", "LABVLDL", "LDLCALC", "CHOLHDL" in the last 168 hours.  Hematology Recent Labs  Lab 06/13/22 0317 06/14/22 0345 06/15/22 0555  WBC 6.7 8.1 8.7  RBC 2.92* 3.37* 3.71*  HGB 10.1* 11.7* 12.6*  HCT 30.7* 34.9* 38.1*  MCV 105.1* 103.6* 102.7*  MCH 34.6* 34.7* 34.0  MCHC 32.9 33.5 33.1  RDW 12.0 11.5 11.4*  PLT 131* 176 228   Thyroid No results for input(s): "TSH", "FREET4" in the last 168 hours.  BNP Recent Labs  Lab 06/10/22 0552 06/12/22 0214  BNP 2,797.6* 254.6*    DDimer No results for input(s): "DDIMER" in the last 168 hours.   Radiology    No  results found.  Cardiac Studies    06/09/22: TTE 1. Left ventricular ejection fraction, by estimation, is <20%. The left  ventricle has severely decreased function. The left ventricle demonstrates  global hypokinesis. The left ventricular internal cavity size was mildly  to moderately dilated. Left  ventricular diastolic parameters are consistent with Grade I diastolic  dysfunction (impaired relaxation).   2. Right ventricular systolic function is mildly reduced. The right  ventricular size is normal. There is mildly elevated pulmonary artery  systolic pressure. The estimated right ventricular systolic pressure is  44.5 mmHg.   3. Left atrial size was moderately dilated.   4. Right atrial size was mildly dilated.   5. The mitral valve is normal in structure. Moderate mitral valve  regurgitation.   6. Tricuspid valve regurgitation is moderate to severe.   7. The aortic valve is normal in structure. Aortic valve regurgitation is  not visualized.   8. The inferior vena cava is normal in size with <50% respiratory  variability, suggesting right atrial pressure of 8 mmHg.    05/24/20: Stress myoview The left ventricular ejection fraction is moderately decreased (30-44%). Nuclear stress EF: 34%. No T wave inversion was noted during stress. There was no ST segment deviation noted during stress. Defect 1: There is a medium defect of moderate severity present in the mid inferior, apical inferior and apex location. This is a high risk study. Findings consistent with prior myocardial infarction.   Medium size, moderate intensity fixed defect of the mid  to apical inferior wall, suggestive of scar. No significant reversible ischemia. LVEF 34% with global hypokinesis and inferior akinesis. This is a high risk study (d/t LVEF <35%). Compared to a prior study in 2018, the fixed inferior defect is persistent, the LVEF is lower and ischemia is no longer noted.   Patient Profile     57 y.o. male  w/PMHx of NICM/chronic CHF, DM, ETOH abuse, depression VT (chronically on amiodarone), ICD and barostim admitted with VT storm/shocks, ETOH intoxication, electrolyte abnormalities,  in setting af about 51mo of not taking most of his meds  Assessment & Plan    VT with ICD shock In setting of med non-compliance, ETOH abuse, and hypokalemia.  ? Worsening CHF as well.  Reloaded on IV amiodarone > PO  yesterday No VT last 24 hours  Amiodarone 400mg  BID x1 week >> 200mg  BID x1 week  200mg  daily    Chronic systolic CHF s/p Barostim (standard) and [employer] Sci single chamber ICD NYHA III chronically Non-compliant with coreg, losartan, Bidil, and spiro.  Not Entresto candidate (h/o angioedema)   AHF team is on board Cardiogenic/septic shock (PNA) Off dobutamine Not a candidate for advanced therapies   Hypokalemia in setting of med non-compliance and ETOH abuse.  Keep K+ 4.0 or better Mag 2.0 or better   Atypical chest pain Chronic, intermittent and stable. No exertional type pains.  Myoview 05/2020 with fixed chronic, persistent defect. No ischemia.  Felt similar prior to ICD firing.    ETOH abuse Drinks most days, most of the day.   Blood level 100 mg/dl this am (consistent with 0.10 BAC) CIWA/withdrawal as per IM team Dr. Lalla Brothers discussed importance of quitting ETOH Urged discussions with family for their support, to see if they can remove his ETOH from his house prior to him getting home, and urged AA group participation   Depression Med Non-compliance C/w IM team   Dr. Lalla Brothers has seen the patient OK to discharge from EP perspective when ready medically otherwise.  NO DRIVING 6 months d/w the patient    For questions or updates, please contact [facility] Please consult [email or web address] for contact info under        Signed, Sheilah Pigeon, PA-C  06/15/2022, 7:19 AM

## 2022-06-15 NOTE — Progress Notes (Addendum)
Patient ID: Stanley [name], male   DOB: [birth year], 58 y.o.   MRN: [id number]     Advanced Heart Failure Rounding Note  PCP-Cardiologist: Arvilla Meres, MD   Subjective:    6/3: Admitted w/ ICD shocks for VT + intoxication and metabolic derangement (K 3.1, Mg 1.2)  6/4: Developed acute respiratory failure, acute pulmonary edema ? Aspiration PNA. Worsened overnight w/ hypotension, transferred to ICU and started on pressors. Also w/ ETOH withdrawal 6/6: RHC RA 10 PCWP 11 CO 3.87 CI 2.25 (Fick), CI 1.78 (thermo), PVR 5.9   Stable on room air. - 1.2 L urine output.  Feels good, no complaints this morning.   Objective:   Weight Range: 62.7 kg Body mass index is 23 kg/m.   Vital Signs:   Temp:  [98 F (36.7 C)-99.3 F (37.4 C)] 98 F (36.7 C) (06/10 0728) Pulse Rate:  [70-92] 70 (06/10 0728) Resp:  [13-20] 20 (06/10 0444) BP: (98-116)/(57-79) 105/68 (06/10 0728) SpO2:  [98 %-100 %] 100 % (06/10 0728) Weight:  [62.7 kg] 62.7 kg (06/10 0444) Last BM Date : 06/14/22  Weight change: Filed Weights   06/13/22 0500 06/14/22 0533 06/15/22 0444  Weight: 61.7 kg 66.9 kg 62.7 kg    Intake/Output:   Intake/Output Summary (Last 24 hours) at 06/15/2022 0904 Last data filed at 06/14/2022 1800 Gross per 24 hour  Intake 340 ml  Output 600 ml  Net -260 ml      Physical Exam  General:  well appearing.  No respiratory difficulty HEENT: normal Neck: supple. JVD ~6 cm. Carotids 2+ bilat; no bruits. No lymphadenopathy or thyromegaly appreciated. Cor: PMI nondisplaced. Regular rate & rhythm. No rubs, gallops or murmurs. Lungs: clear Abdomen: soft, nontender, nondistended. No hepatosplenomegaly. No bruits or masses. Good bowel sounds. Extremities: no cyanosis, clubbing, rash, edema  Neuro: alert & oriented x 3, cranial nerves grossly intact. moves all 4 extremities w/o difficulty. Affect pleasant.   Telemetry   SR 80s, personally reviewed.   EKG    No new EKG to review   Labs     CBC Recent Labs    06/14/22 0345 06/15/22 0555  WBC 8.1 8.7  HGB 11.7* 12.6*  HCT 34.9* 38.1*  MCV 103.6* 102.7*  PLT 176 228   Basic Metabolic Panel Recent Labs    40/98/11 0345 06/15/22 0555  NA 134* 136  K 3.1* 3.8  CL 97* 99  CO2 26 27  GLUCOSE 78 97  BUN 17 19  CREATININE 1.24 1.33*  CALCIUM 8.6* 9.3  MG 2.0 1.9   Liver Function Tests Recent Labs    06/13/22 0428  AST 44*  ALT 72*  ALKPHOS 79  BILITOT 1.3*  PROT 6.3*  ALBUMIN 3.0*   No results for input(s): "LIPASE", "AMYLASE" in the last 72 hours.  Cardiac Enzymes No results for input(s): "CKTOTAL", "CKMB", "CKMBINDEX", "TROPONINI" in the last 72 hours.  BNP: BNP (last 3 results) Recent Labs    06/10/22 0552 06/12/22 0214  BNP 2,797.6* 254.6*   ProBNP (last 3 results) No results for input(s): "PROBNP" in the last 8760 hours.  D-Dimer No results for input(s): "DDIMER" in the last 72 hours. Hemoglobin A1C No results for input(s): "HGBA1C" in the last 72 hours.  Fasting Lipid Panel No results for input(s): "CHOL", "HDL", "LDLCALC", "TRIG", "CHOLHDL", "LDLDIRECT" in the last 72 hours. Thyroid Function Tests No results for input(s): "TSH", "T4TOTAL", "T3FREE", "THYROIDAB" in the last 72 hours.  Invalid input(s): "FREET3"  Other results:  Imaging  No results found.  Medications:     Scheduled Medications:  amiodarone  400 mg Oral BID   atorvastatin  20 mg Oral Daily   Chlorhexidine Gluconate Cloth  6 each Topical Daily   digoxin  0.0625 mg Oral Daily   enoxaparin (LOVENOX) injection  40 mg Subcutaneous Q24H   folic acid  1 mg Oral Daily   furosemide  40 mg Oral Daily   lidocaine  1 patch Transdermal Q24H   multivitamin with minerals  1 tablet Oral Daily   pantoprazole  40 mg Oral Daily   PARoxetine  20 mg Oral Daily   potassium chloride  30 mEq Oral BID   sodium chloride flush  3 mL Intravenous Q12H   sodium chloride flush  3 mL Intravenous Q12H   spironolactone  12.5 mg  Oral Daily   thiamine  100 mg Oral Daily   traZODone  150 mg Oral QHS   Infusions:  sodium chloride Stopped (06/13/22 1355)   sodium chloride     cefTRIAXone (ROCEPHIN)  IV 2 g (06/14/22 0928)    PRN Medications: sodium chloride, acetaminophen, alum & mag hydroxide-simeth, guaiFENesin, ondansetron (ZOFRAN) IV, prochlorperazine, sodium chloride flush  Patient Profile  Stanley [name] is a 57 y.o. male with systolic HF due to NICM d/x'd in 2006 (felt 2/2 HTN/ETOH), ? CAD s/p PCI to unknown vessel 2011 at outside location (cath 2014 without CAD and normal coronaries by CT 07/2016), VT s/p [facility] ICD implant ([place] 2015), noncompliance, chronic noncardiac chest pain, depression, HTN and ETOH abuse.  Admitted with VT with ICD shocks in the setting of acute decompensated HF, med noncompliance, ETOH intoxication and electrolyte derangements. AHF to see for acute on chronic systolic heart failure. Also w/ possible aspiration PNA.  Assessment/Plan  1. VT: Shocks from [employer] ICD, now in NSR on amiodarone gtt. No prior chest pain, prior coronary workups have shown no significant coronary disease. HS-TnI mildly elevated with no trend, suspect demand ischemia from volume overload and VT arrest.  Suspect arrest was due to underlying NICM, off meds (had been on amiodarone in remote past), with hypokalemia and worsening CHF. No further VT.  - Continue amiodarone to 400 mg bid x1 week >> 200mg  BID x1 week>>  200mg  daily per EP recs - Keep K > 4.0 and Mg > 2.0.  2. Acute on chronic systolic CHF: Patient with history of NICM, last coronary evaluation was CTA in 2018 with normal coronaries and calcium score 0.  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  Most recent prior echo in 2021 with EF 25-30%.  Echo this admission with EF < 20%, mild RV dysfunction, moderate-severe TR, moderate MR. Developed acute pulmonary edema yesterday and diuresed w/ IV Lasix. Now w/ hypotension requiring pressor support and AKI, SCr  bumped 1.13>>1.94, 1.3 today. Suspect component of septic shock 2/2 PNA. PCT 0.46>>1.95. On Ceftriaxone. 6/6 RHC RA 10 PCWP 11 Thermo CO 2.9 CI 1.78.  - stop lasix, start Jardiance 10 mg daily tomorrow (got 40 lasix today) - Increase spironolactone 12.5>25 daily.  - continue digoxin 0.0625 daily (was on in the past).  - Stable off dobutamine; mixed venous 84%.  - Not candidate for advanced therapies at this time with heavy ETOH abuse.  3. ETOH abuse: Intoxicated at admission. Heavy ETOH use, likely contributes to cardiomyopathy.  - CIWA protocol.  4. Acute hypoxemic respiratory failure: Suspect aspiration PNA, diffuse airspace disease right lung on initial CXR.  PCT up to 1.95.  Now afebrile with  normal WBCs.  Now stable on room air. - Continue ceftriaxone.  5. AKI: SCr peaked at 1.9  - sCr 1.3 today.   May be stable for discharge, pending MD eval. Has f/u in AHF clinic. Will provide patient with pill box prior to d/c. Will need meds sent to [facility], unless he goes to SNF (awaiting state approval).   Alen Bleacher 06/15/2022 9:04 AM  Patient seen and examined with the above-signed Advanced Practice Provider and/or Housestaff. I personally reviewed laboratory data, imaging studies and relevant notes. I independently examined the patient and formulated the important aspects of the plan. I have edited the note to reflect any of my changes or salient points. I have personally discussed the plan with the patient and/or family.  Denies CP or SOB. No further VT.   General:  Well appearing. No resp difficulty HEENT: normal Neck: supple. no JVD. Carotids 2+ bilat; no bruits. No lymphadenopathy or thryomegaly appreciated. Cor: PMI nondisplaced. Regular rate & rhythm. No rubs, gallops or murmurs. Lungs: clear Abdomen: soft, nontender, nondistended. No hepatosplenomegaly. No bruits or masses. Good bowel sounds. Extremities: no cyanosis, clubbing, rash, edema Neuro: alert & orientedx3, cranial nerves  grossly intact. moves all 4 extremities w/o difficulty. Affect pleasant  He is stable for d/c from HF perspective today.   D/c HF meds  Cleda Daub 25 daily (new) Jardiance 10 daily (new) Digoxin 0.0625 daily Amiodarone to 400 mg bid x1 week >> 200mg  BID x1 week>>  200mg  daily Lasix 80 mg PRN only  (Stop carvedilol and potassium for now)  AHF team will s/o. Please call with ?s.  We will arrange outpatient f/u in HF Clinic.   Arvilla Meres, MD  2:33 PM

## 2022-06-15 NOTE — Progress Notes (Signed)
Rt internal jugular removed per order ,pt tolerated the procedure well,vitals taken and pt educate to be in bed rest for 30 min,pt verbalize understanding

## 2022-06-16 ENCOUNTER — Other Ambulatory Visit (HOSPITAL_COMMUNITY): Payer: Self-pay

## 2022-06-16 LAB — CBC
HCT: 37.7 % — ABNORMAL LOW (ref 39.0–52.0)
Hemoglobin: 12.5 g/dL — ABNORMAL LOW (ref 13.0–17.0)
MCH: 33.2 pg (ref 26.0–34.0)
MCHC: 33.2 g/dL (ref 30.0–36.0)
MCV: 100.3 fL — ABNORMAL HIGH (ref 80.0–100.0)
Platelets: 256 10*3/uL (ref 150–400)
RBC: 3.76 MIL/uL — ABNORMAL LOW (ref 4.22–5.81)
RDW: 11.6 % (ref 11.5–15.5)
WBC: 9.2 10*3/uL (ref 4.0–10.5)
nRBC: 0 % (ref 0.0–0.2)

## 2022-06-16 LAB — BASIC METABOLIC PANEL
Anion gap: 14 (ref 5–15)
BUN: 19 mg/dL (ref 6–20)
CO2: 22 mmol/L (ref 22–32)
Calcium: 9.7 mg/dL (ref 8.9–10.3)
Chloride: 101 mmol/L (ref 98–111)
Creatinine, Ser: 1.31 mg/dL — ABNORMAL HIGH (ref 0.61–1.24)
GFR, Estimated: >60 mL/min (ref >60)
Glucose, Bld: 83 mg/dL (ref 70–99)
Potassium: 3.6 mmol/L (ref 3.5–5.1)
Sodium: 137 mmol/L (ref 135–145)

## 2022-06-16 LAB — LIPOPROTEIN A (LPA): Lipoprotein (a): 17.5 nmol/L (ref <75.0)

## 2022-06-16 LAB — MAGNESIUM: Magnesium: 1.8 mg/dL (ref 1.7–2.4)

## 2022-06-16 LAB — DIGOXIN LEVEL: Digoxin Level: 0.6 ng/mL — ABNORMAL LOW (ref 0.8–2.0)

## 2022-06-16 MED ORDER — FUROSEMIDE 80 MG PO TABS
ORAL_TABLET | ORAL | 5 refills | Status: DC
  Filled 2022-06-16: qty 45, 45d supply, fill #0

## 2022-06-16 MED ORDER — AMIODARONE HCL 200 MG PO TABS
ORAL_TABLET | ORAL | 6 refills | Status: DC
  Filled 2022-06-16 (×2): qty 74, 46d supply, fill #0

## 2022-06-16 MED ORDER — MAGNESIUM SULFATE 2 GM/50ML IV SOLN
2.0000 g | Freq: Once | INTRAVENOUS | Status: AC
  Administered 2022-06-16: 2 g via INTRAVENOUS
  Filled 2022-06-16: qty 50

## 2022-06-16 MED ORDER — ATORVASTATIN CALCIUM 20 MG PO TABS
20.0000 mg | ORAL_TABLET | Freq: Every day | ORAL | 3 refills | Status: DC
Start: 2022-06-16 — End: 2022-07-14
  Filled 2022-06-16: qty 90, 90d supply, fill #0

## 2022-06-16 MED ORDER — SPIRONOLACTONE 25 MG PO TABS
25.0000 mg | ORAL_TABLET | Freq: Every day | ORAL | 5 refills | Status: DC
  Filled 2022-06-16: qty 30, 30d supply, fill #0

## 2022-06-16 MED ORDER — POTASSIUM CHLORIDE CRYS ER 20 MEQ PO TBCR
40.0000 meq | EXTENDED_RELEASE_TABLET | Freq: Once | ORAL | Status: AC
  Administered 2022-06-16: 40 meq via ORAL
  Filled 2022-06-16: qty 2

## 2022-06-16 MED ORDER — EMPAGLIFLOZIN 10 MG PO TABS
10.0000 mg | ORAL_TABLET | Freq: Every day | ORAL | 5 refills | Status: DC
  Filled 2022-06-16: qty 30, 30d supply, fill #0

## 2022-06-16 MED ORDER — NITROGLYCERIN 0.4 MG SL SUBL
0.4000 mg | SUBLINGUAL_TABLET | SUBLINGUAL | 6 refills | Status: DC | PRN
Start: 2022-06-16 — End: 2022-06-24
  Filled 2022-06-16: qty 25, 4d supply, fill #0

## 2022-06-16 MED ORDER — DIGOXIN 125 MCG PO TABS
0.0625 mg | ORAL_TABLET | Freq: Every day | ORAL | 5 refills | Status: DC
  Filled 2022-06-16 – 2022-06-17 (×2): qty 15, 30d supply, fill #0

## 2022-06-16 NOTE — Discharge Summary (Signed)
Physician Discharge Summary  Stanley [name] ZOX:[id number] DOB: Jul 01, 1965 DOA: 06/08/2022  PCP: [name], NP  Admit date: 06/08/2022 Discharge date: 06/16/2022 Recommendations for Outpatient Follow-up:  Follow up with PCP in 1 weeks-call for appointment Please obtain BMP/CBC in one week  Discharge Dispo: Home Discharge Condition: Stable Code Status:   Code Status: Full Code Diet recommendation:  Diet Order             Diet Heart Room service appropriate? Yes; Fluid consistency: Thin  Diet effective now                    Brief/Interim Summary: 57 year old male with a past medical history of HFrEF, alcohol abuse, nonischemic cardiomyopathy, anxiety/depression who presented on 6/3 after his ICD discharge multiple times at home.  Seen in ED, EP determined this was due to VT in setting of medical noncompliance, electrolyte issues, and alcohol intoxication.  Patient was admitted,started on amiodarone. On 6/4, he choked on a banana and desaturated.  He was thus placed on BiPAP, chest x-ray showed pulmonary edema and diuresis was started.  Blood pressure dropped overnight and started on pressors, transferred to the ICU>On 6/6, he had a right heart catheterization. Over the next few days, he was weaned from Levophed and transferred to the floor on 6/8 still on dobutamine that was off 6/9. Being followed by advanced heart failure team and EP cardiology. Patient has been transitioned to oral amiodarone. PICC line removed 6/10 and cleared for discharge by cardiology.  He remains medically stable,eager le to go home today, TOC arranged prescription as well as a pill box, reinforced the need to comply with medications   Discharge Diagnoses:  Principal Problem:   AICD discharge Active Problems:   Acute hypoxemic respiratory failure (HCC)   ETOH abuse   Nonischemic cardiomyopathy (HCC)   Chronic systolic heart failure (HCC)   Abdominal pain   Anxiety and depression   Acute on  chronic combined systolic and diastolic CHF (congestive heart failure) (HCC)   Cardiogenic shock (HCC)   Alcoholic intoxication without complication (HCC)   VT with ICD shock: In the setting of noncompliance, EtOH abuse, hypokalemia, ?worsening CHF, managed by EP cardiology reloaded on IV amiodarone transition to p.o. 6/9 no PT last 24 hours. Recs- Amiodarone 400mg  BID x1 week >> 200mg  BID x1 week 200mg  daily.  Acute on chronic systolic CHF Acute pulmonary edema 6/8 History of NICM: ast coronary evaluation with CTA to PRN negative: Status post right heart cath 6/6 with echo showing EF less than 20% RV dysfunction moderate-severe TR. advanced heart failure team following> GDMT adjusted with Aldactone, digoxin, lasix.  Off dobutamine 6/9.  Not a candidate for advanced therapies at this time with heavy alcohol abuse.  Okay for discharge per cardiology  Hypotension/shock combination of cardiogenic shock/pneumonia: Patient needing pressors with dobutamine.  Currently vital stable.  Currently no leukocytosis, afebrile procalcitonin peaked to 1 1.9 on 6/6.  Patient was treated with Unasyn 6/4>> ceftriaxone 6/5>>, Flagyl 6/5>> 6/7.  Completed antibiotics.  Doing well on room air  Acute hypoxic respiratory failure: Currently resolved on room air AKI: Currently creatinine at 1.3, peaked to 1.9. Alcohol abuse: Continue thiamine folate no signs of withdrawal.,  TSH consult for resources Anxiety/depression-on Paxil, trazodone Abdominal pain currently resolved Hyperlipidemia on Lipitor. Hypokalemia replace and keep above 4 Atypical chest pain in the setting of ICD firing. Noncompliance with medication patient has been counseled.  Consults: cardio Subjective: Aaox3, feels well, feels ready t ogo home  today  Discharge Exam: Vitals:   06/16/22 0351 06/16/22 0800  BP: 113/74 107/70  Pulse: 75 68  Resp: 16 18  Temp: 98.2 F (36.8 C) 97.6 F (36.4 C)  SpO2: 100% 98%   General: Pt is alert, awake,  not in acute distress Cardiovascular: RRR, S1/S2 +, no rubs, no gallops Respiratory: CTA bilaterally, no wheezing, no rhonchi Abdominal: Soft, NT, ND, bowel sounds + Extremities: no edema, no cyanosis  Discharge Instructions  Discharge Instructions     (HEART FAILURE PATIENTS) Call MD:  Anytime you have any of the following symptoms: 1) 3 pound weight gain in 24 hours or 5 pounds in 1 week 2) shortness of breath, with or without a dry hacking cough 3) swelling in the hands, feet or stomach 4) if you have to sleep on extra pillows at night in order to breathe.   Complete by: As directed    Discharge instructions   Complete by: As directed    Please call call MD or return to ER for similar or worsening recurring problem that brought you to hospital or if any fever,nausea/vomiting,abdominal pain, uncontrolled pain, chest pain,  shortness of breath or any other alarming symptoms.  Please follow-up your doctor as instructed in a week time and call the office for appointment.  Please avoid alcohol, smoking, or any other illicit substance and maintain healthy habits including taking your regular medications as prescribed.  You were cared for by a hospitalist during your hospital stay. If you have any questions about your discharge medications or the care you received while you were in the hospital after you are discharged, you can call the unit and ask to speak with the hospitalist on call if the hospitalist that took care of you is not available.  Once you are discharged, your primary care physician will handle any further medical issues. Please note that NO REFILLS for any discharge medications will be authorized once you are discharged, as it is imperative that you return to your primary care physician (or establish a relationship with a primary care physician if you do not have one) for your aftercare needs so that they can reassess your need for medications and monitor your lab values       Allergies as of 06/16/2022       Reactions   Lisinopril Shortness Of Breath   Makes lips swell   Allopurinol Nausea Only   dizziness   Entresto [sacubitril-valsartan]    History of angioedema   Other Swelling   Nuts - Lip Swelling   Shellfish Allergy Swelling   Lip Swelling        Medication List     STOP taking these medications    carvedilol 25 MG tablet Commonly known as: COREG   potassium chloride SA 20 MEQ tablet Commonly known as: KLOR-CON M   sodium chloride 0.65 % Soln nasal spray Commonly known as: OCEAN       TAKE these medications    allopurinol 300 MG tablet Commonly known as: ZYLOPRIM Take 1 tablet (300 mg total) by mouth daily.   amiodarone 200 MG tablet Commonly known as: PACERONE Take 2 tablets (400 mg total) by mouth 2 (two) times daily for 7 days, THEN 1 tablet (200 mg total) 2 (two) times daily for 7 days, THEN 1 tablet (200 mg total) daily thereafter. Start taking on: June 16, 2022 What changed: See the new instructions.   atorvastatin 20 MG tablet Commonly known as: LIPITOR Take 1 tablet (20  mg total) by mouth daily.   digoxin 0.125 MG tablet Commonly known as: LANOXIN Take 0.5 tablets (0.0625 mg total) by mouth daily. Start taking on: June 17, 2022 What changed: See the new instructions.   empagliflozin 10 MG Tabs tablet Commonly known as: JARDIANCE Take 1 tablet (10 mg total) by mouth daily. Start taking on: June 17, 2022   furosemide 80 MG tablet Commonly known as: LASIX MAY TAKE 1 TABLET AS NEEDED FOR SWELLING What changed: additional instructions   nitroGLYCERIN 0.4 MG SL tablet Commonly known as: NITROSTAT Place 1 tablet (0.4 mg total) under the tongue every 5 (five) minutes for 3 doses as needed for chest pain.   spironolactone 25 MG tablet Commonly known as: ALDACTONE Take 1 tablet (25 mg total) by mouth daily. Start taking on: June 17, 2022   traZODone 150 MG tablet Commonly known as: DESYREL Take 1 tablet  (150 mg total) by mouth at bedtime.               Durable Medical Equipment  (From admission, onward)           Start     Ordered   06/15/22 1451  For home use only DME Cane  Once        06/15/22 1450   06/15/22 1309  For home use only DME Walker rolling  Once       Question Answer Comment  Walker: With 5 Inch Wheels   Patient needs a walker to treat with the following condition Heart failure (HCC)      06/15/22 1308   06/15/22 1250  For home use only DME 4 wheeled rolling walker with seat  Once       Question:  Patient needs a walker to treat with the following condition  Answer:  Heart failure (HCC)   06/15/22 1249            Follow-up Information     [facility] Follow up on 06/24/2022.   Specialty: Cardiology Why: Follow up in the Advanced Heart Failure Clinic 6/19 at 1130 am Entrance C, free valet Please bring all medications with you Contact information: [address] [id number] mc [address] [place] [address] [phone]        [name], NP Follow up.   Specialty: Nurse Practitioner Why: office will call to schedule hospital follow up Contact information: [address] [place] [address] [phone]         [facility] Follow up.   Specialty: Home Health Services Why: Home Health RN and Physical Therapy-agency will call to arrang appt Contact information: [address] [place] [address] [phone]                Allergies  Allergen Reactions   Lisinopril Shortness Of Breath    Makes lips swell   Allopurinol Nausea Only    dizziness   Entresto [Sacubitril-Valsartan]     History of angioedema   Other Swelling    Nuts - Lip Swelling   Shellfish Allergy Swelling    Lip Swelling    The results of significant diagnostics from this hospitalization (including imaging, microbiology, ancillary and laboratory) are listed below  for reference.    Microbiology: Recent Results (from the past 240 hour(s))  MRSA Next Gen by PCR, Nasal     Status: None   Collection Time: 06/10/22  1:40 AM   Specimen: Nasal Mucosa; Nasal Swab  Result Value Ref Range Status   MRSA by PCR Next Gen NOT DETECTED NOT DETECTED Final    Comment: (NOTE) The GeneXpert MRSA Assay (FDA approved for NASAL specimens only), is one component of a comprehensive MRSA colonization surveillance program. It is not intended to diagnose MRSA infection nor to guide or monitor treatment for MRSA infections. Test performance is not FDA approved in patients less than 50 years old. Performed at [facility], [address] [place], [place] [address]   Surgical PCR screen     Status: None   Collection Time: 06/10/22  3:40 PM   Specimen: Nasal Mucosa; Nasal Swab  Result Value Ref Range Status   MRSA, PCR NEGATIVE NEGATIVE Final   Staphylococcus aureus NEGATIVE NEGATIVE Final    Comment: (NOTE) The Xpert SA Assay (FDA approved for NASAL specimens in patients 84 years of age and older), is one component of a comprehensive surveillance program. It is not intended to diagnose infection nor to guide or monitor treatment. Performed at [facility], [address] [place], [place] [address]     Procedures/Studies: [place] CHEST PORT 1 VIEW  Result Date: 06/12/2022 CLINICAL DATA:  Pneumonia EXAM: PORTABLE CHEST 1 VIEW COMPARISON:  Chest x-ray June 5, 24. FINDINGS: Enlarged cardiopericardial silhouette. Right IJ approach catheter with the tip projecting in the region of the upper SVC. No consolidation. No visible pleural effusion or pneumothorax. Left subclavian approach cardiac rhythm maintenance device in place. Right neck nerve stimulator partially imaged. IMPRESSION: Cardiomegaly.  Otherwise, no evidence acute cardiopulmonary disease. Electronically Signed   By: Feliberto Harts M.D.   On: 06/12/2022 14:13   CARDIAC CATHETERIZATION  Result Date:  06/11/2022 1. Normal PCWP with elevated RA pressure. 2. Mild-moderate pulmonary arterial hypertension. 3. Fick CI mildly decreased, thermodilution CI severe decreased. Will start dobutamine 2.5 mcg/kg/min.   DG Chest Port 1 View  Result Date: 06/10/2022 CLINICAL DATA:  Respiratory distress. EXAM: PORTABLE CHEST 1 VIEW COMPARISON:  Earlier same day FINDINGS: The cardio pericardial silhouette is enlarged. Diffuse airspace disease in the right lung is similar to prior. Right IJ central line is new in the interval with tip overlying the region of the innominate vein confluence. No evidence for pneumothorax or substantial right pleural effusion. Left-sided AICD remains in place with battery pack for right-sided stimulator device evident. Telemetry leads overlie the chest. IMPRESSION: New right IJ central line tip overlies the region of the innominate vein confluence. No pneumothorax or substantial right pleural effusion. Similar diffuse airspace disease in the right lung. Electronically Signed   By: Kennith Center M.D.   On: 06/10/2022 14:39   DG CHEST PORT 1 VIEW  Result Date: 06/10/2022 CLINICAL DATA:  Respiratory distress EXAM: PORTABLE CHEST 1 VIEW COMPARISON:  06/09/2022 FINDINGS: Defibrillator is again identified. Stimulator pack is seen on the right. Cardiac shadow is enlarged. Diffuse airspace opacity is noted throughout the right lung although slightly improved when compared with the prior exam. Left lung remains clear. No bony abnormality is noted. IMPRESSION: Slight improved aeration in the right lung when compared with the previous day. Electronically Signed   By: Alcide Clever M.D.   On: 06/10/2022 00:47   ECHOCARDIOGRAM COMPLETE  Result Date: 06/09/2022    ECHOCARDIOGRAM REPORT   Patient Name:   Stanley [name] Date of Exam: 06/09/2022 Medical Rec #:  [id number]         Height:       65.0 in Accession #:    [id number]  Weight:       139.0 lb Date of Birth:  1966-01-03         BSA:          1.695  m Patient Age:    56 years          BP:           130/99 mmHg Patient Gender: M                 HR:           108 bpm. Exam Location:  Inpatient Procedure: 2D Echo, Cardiac Doppler, Color Doppler and Intracardiac            Opacification Agent Indications:    Ventricular Tachycardia I47.2  History:        Patient has prior history of Echocardiogram examinations, most                 recent 12/30/2018. CHF and Cardiomyopathy, CAD, Defibrillator,                 ETOH abuse; Risk Factors:Hypertension, Former Smoker and Sleep                 Apnea.  Sonographer:    Aron Baba Referring Phys: [id number] PETER C MESSICK IMPRESSIONS  1. Left ventricular ejection fraction, by estimation, is <20%. The left ventricle has severely decreased function. The left ventricle demonstrates global hypokinesis. The left ventricular internal cavity size was mildly to moderately dilated. Left ventricular diastolic parameters are consistent with Grade I diastolic dysfunction (impaired relaxation).  2. Right ventricular systolic function is mildly reduced. The right ventricular size is normal. There is mildly elevated pulmonary artery systolic pressure. The estimated right ventricular systolic pressure is 44.5 mmHg.  3. Left atrial size was moderately dilated.  4. Right atrial size was mildly dilated.  5. The mitral valve is normal in structure. Moderate mitral valve regurgitation.  6. Tricuspid valve regurgitation is moderate to severe.  7. The aortic valve is normal in structure. Aortic valve regurgitation is not visualized.  8. The inferior vena cava is normal in size with <50% respiratory variability, suggesting right atrial pressure of 8 mmHg. FINDINGS  Left Ventricle: Left ventricular ejection fraction, by estimation, is <20%. The left ventricle has severely decreased function. The left ventricle demonstrates global hypokinesis. Definity contrast agent was given IV to delineate the left ventricular endocardial borders. The left  ventricular internal cavity size was mildly to moderately dilated. There is no left ventricular hypertrophy. Left ventricular diastolic parameters are consistent with Grade I diastolic dysfunction (impaired relaxation). Right Ventricle: The right ventricular size is normal. No increase in right ventricular wall thickness. Right ventricular systolic function is mildly reduced. There is mildly elevated pulmonary artery systolic pressure. The tricuspid regurgitant velocity  is 3.02 m/s, and with an assumed right atrial pressure of 8 mmHg, the estimated right ventricular systolic pressure is 44.5 mmHg. Left Atrium: Left atrial size was moderately dilated. Right Atrium: Right atrial size was mildly dilated. Pericardium: There is no evidence of pericardial effusion. Mitral Valve: The mitral valve is normal in structure. Moderate mitral valve regurgitation. Tricuspid Valve: The tricuspid valve is normal in structure. Tricuspid valve regurgitation is moderate to severe. Aortic Valve: The aortic valve is normal in structure. Aortic valve regurgitation is not visualized. Pulmonic Valve: The pulmonic valve was normal in structure. Pulmonic valve regurgitation is mild. Aorta: The aortic root is normal in size and structure. Venous: The inferior vena cava  is normal in size with less than 50% respiratory variability, suggesting right atrial pressure of 8 mmHg. IAS/Shunts: No atrial level shunt detected by color flow Doppler. Additional Comments: A device lead is visualized.  LEFT VENTRICLE PLAX 2D LVIDd:         6.00 cm      Diastology LVIDs:         5.80 cm      LV e' medial:    10.50 cm/s LV PW:         1.10 cm      LV E/e' medial:  14.2 LV IVS:        0.90 cm      LV e' lateral:   8.06 cm/s LVOT diam:     2.00 cm      LV E/e' lateral: 18.5 LV SV:         35 LV SV Index:   21 LVOT Area:     3.14 cm  LV Volumes (MOD) LV vol d, MOD A2C: 240.0 ml LV vol d, MOD A4C: 164.0 ml LV vol s, MOD A2C: 222.0 ml LV vol s, MOD A4C: 130.0 ml  LV SV MOD A2C:     18.0 ml LV SV MOD A4C:     164.0 ml LV SV MOD BP:      25.4 ml RIGHT VENTRICLE RV S prime:     12.10 cm/s TAPSE (M-mode): 1.1 cm LEFT ATRIUM           Index        RIGHT ATRIUM           Index LA diam:      4.60 cm 2.71 cm/m   RA Area:     16.60 cm LA Vol (A2C): 71.4 ml 42.13 ml/m  RA Volume:   46.30 ml  27.32 ml/m LA Vol (A4C): 66.6 ml 39.30 ml/m  AORTIC VALVE             PULMONIC VALVE LVOT Vmax:   79.30 cm/s  PR End Diast Vel: 13.69 msec LVOT Vmean:  52.000 cm/s LVOT VTI:    0.111 m  AORTA Ao Root diam: 2.80 cm Ao Asc diam:  3.20 cm MITRAL VALVE                 TRICUSPID VALVE MV Area (PHT): 7.66 cm      TR Peak grad:   36.5 mmHg MV Decel Time: 99 msec       TR Vmax:        302.00 cm/s MR Peak grad:   91.1 mmHg MR Mean grad:   62.0 mmHg    SHUNTS MR Vmax:        477.33 cm/s  Systemic VTI:  0.11 m MR Vmean:       369.0 cm/s   Systemic Diam: 2.00 cm MR PISA:        1.01 cm MR PISA Radius: 0.40 cm MV E velocity: 149.00 cm/s Aditya Sabharwal Electronically signed by Dorthula Nettles Signature Date/Time: 06/09/2022/2:30:42 PM    Final    DG Chest Port 1 View  Result Date: 06/09/2022 CLINICAL DATA:  Dyspnea. EXAM: PORTABLE CHEST 1 VIEW COMPARISON:  Radiograph yesterday FINDINGS: Development of extensive asymmetric right lung opacities with a slight perihilar distribution. There also new confluent opacities at the retrocardiac left lung base. Stable cardiomegaly with single lead pacemaker in place. Battery pack for a neural stimulator on the right. A small right pleural effusion with fluid in the  fissures. No pneumothorax. IMPRESSION: 1. Development of diffuse right lung opacities and left basilar opacities that may represent asymmetric pulmonary edema or multifocal pneumonia. Pulmonary edema is favored given rapid development since yesterday. 2. Stable cardiomegaly. Electronically Signed   By: Narda Rutherford M.D.   On: 06/09/2022 10:53   US Abdomen Limited RUQ (LIVER/GB)  Result Date:  06/08/2022 CLINICAL DATA:  Abdominal fullness EXAM: ULTRASOUND ABDOMEN LIMITED RIGHT UPPER QUADRANT COMPARISON:  CT abdomen and pelvis dated 06/30/2021 FINDINGS: Gallbladder: No wall thickening visualized. Cholelithiasis. No sonographic Murphy sign noted by sonographer. Common bile duct: Diameter: 5 mm Liver: Diffusely increased hepatic echogenicity can be seen in the setting of infiltrative process such as steatosis. Portal vein is patent on color Doppler imaging with normal direction of blood flow towards the liver. Other: None. IMPRESSION: 1. Cholelithiasis without sonographic evidence of acute cholecystitis. 2. Diffusely increased hepatic echogenicity can be seen in the setting of infiltrative process such as steatosis. Electronically Signed   By: Agustin Cree M.D.   On: 06/08/2022 17:40   DG Chest Port 1 View  Result Date: 06/08/2022 CLINICAL DATA:  Shortness of breath EXAM: PORTABLE CHEST 1 VIEW COMPARISON:  X-ray 05/17/2021 FINDINGS: Left upper chest defibrillator with leads along the right side of the heart. Stable borderline size heart. Slight central vascular congestion. Hyperinflation. No consolidation, pneumothorax or effusion. Overlapping cardiac leads. Separate battery pack along the right upper chest with leads extending towards the right neck. IMPRESSION: Defibrillator. Borderline size heart with central vascular congestion. Electronically Signed   By: Karen Kays M.D.   On: 06/08/2022 14:34    Labs: BNP (last 3 results) Recent Labs    06/10/22 0552 06/12/22 0214  BNP 2,797.6* 254.6*   Basic Metabolic Panel: Recent Labs  Lab 06/10/22 0552 06/11/22 0349 06/11/22 1107 06/11/22 1700 06/12/22 0334 06/13/22 0428 06/14/22 0345 06/15/22 0555 06/16/22 0145  NA  --    < >  --    < > 133* 135 134* 136 137  K  --    < >  --    < > 3.2* 3.2* 3.1* 3.8 3.6  CL  --    < >  --   --  100 94* 97* 99 101  CO2  --    < >  --   --  23 27 26 27 22   GLUCOSE  --    < >  --   --  104* 80 78 97 83   BUN  --    < >  --   --  22* 20 17 19 19   CREATININE  --    < >  --    < > 1.46* 1.42* 1.24 1.33* 1.31*  CALCIUM  --    < >  --   --  8.3* 8.7* 8.6* 9.3 9.7  MG 3.4*   < >  --   --  2.1 1.8 2.0 1.9 1.8  PHOS 1.1*  --  3.8  --  4.6  --   --   --   --    < > = values in this interval not displayed.   Liver Function Tests: Recent Labs  Lab 06/10/22 0552 06/13/22 0428  AST 124* 44*  ALT 129* 72*  ALKPHOS 82 79  BILITOT 2.0* 1.3*  PROT 6.3* 6.3*  ALBUMIN 3.0* 3.0*   Recent Labs  Lab 06/12/22 0214 06/12/22 1118 06/13/22 0317 06/14/22 0345 06/15/22 0555 06/16/22 0145  WBC 8.2  --  6.7 8.1  8.7 9.2  HGB 11.5*  --  10.1* 11.7* 12.6* 12.5*  HCT 34.2* 33.3* 30.7* 34.9* 38.1* 37.7*  MCV 104.3*  --  105.1* 103.6* 102.7* 100.3*  PLT 125*  --  131* 176 228 256  CBG: Recent Labs  Lab 06/09/22 2301 06/10/22 0138  GLUCAP 171* 162*      Component Value Date/Time   COLORURINE YELLOW 07/05/2019 1420   APPEARANCEUR Clear 02/11/2022 1603   LABSPEC 1.015 07/05/2019 1420   PHURINE 6.0 07/05/2019 1420   GLUCOSEU Negative 02/11/2022 1603   HGBUR NEGATIVE 07/05/2019 1420   BILIRUBINUR Negative 02/11/2022 1603   KETONESUR negative 02/19/2020 1031   KETONESUR NEGATIVE 07/05/2019 1420   PROTEINUR 1+ (A) 02/11/2022 1603   PROTEINUR NEGATIVE 07/05/2019 1420   UROBILINOGEN 0.2 02/19/2020 1031   NITRITE Negative 02/11/2022 1603   NITRITE NEGATIVE 07/05/2019 1420   LEUKOCYTESUR Negative 02/11/2022 1603   LEUKOCYTESUR NEGATIVE 07/05/2019 1420   Sepsis Labs Recent Labs  Lab 06/13/22 0317 06/14/22 0345 06/15/22 0555 06/16/22 0145  WBC 6.7 8.1 8.7 9.2   Microbiology Recent Results (from the past 240 hour(s))  MRSA Next Gen by PCR, Nasal     Status: None   Collection Time: 06/10/22  1:40 AM   Specimen: Nasal Mucosa; Nasal Swab  Result Value Ref Range Status   MRSA by PCR Next Gen NOT DETECTED NOT DETECTED Final    Comment: (NOTE) The GeneXpert MRSA Assay (FDA approved for NASAL  specimens only), is one component of a comprehensive MRSA colonization surveillance program. It is not intended to diagnose MRSA infection nor to guide or monitor treatment for MRSA infections. Test performance is not FDA approved in patients less than 6 years old. Performed at [facility], [address] [place], [place] [address]   Surgical PCR screen     Status: None   Collection Time: 06/10/22  3:40 PM   Specimen: Nasal Mucosa; Nasal Swab  Result Value Ref Range Status   MRSA, PCR NEGATIVE NEGATIVE Final   Staphylococcus aureus NEGATIVE NEGATIVE Final    Comment: (NOTE) The Xpert SA Assay (FDA approved for NASAL specimens in patients 23 years of age and older), is one component of a comprehensive surveillance program. It is not intended to diagnose infection nor to guide or monitor treatment. Performed at [facility] [facility], [address] [place], [place] [address]    Time coordinating discharge: 35 minutes  SIGNED: Lanae Boast, MD  [facility] 06/16/2022, 10:47 AM  If 7PM-7AM, please contact night-coverage [email or web address]

## 2022-06-16 NOTE — TOC Transition Note (Addendum)
Transition of Care (TOC) - CM/SW Discharge Note Donn Pierini RN, BSN Transitions of Care Unit 4E- RN Case Manager See Treatment Team for direct phone #   Patient Details  Name: [name] MRN: [id number] Date of Birth: [birth year]  Transition of Care [facility]) CM/SW Contact:  Darrold Span, RN Phone Number: 06/16/2022, 11:13 AM   Clinical Narrative:    Pt stable for transition home today Per HF CM note- HH and DME have been arranged.  HH set up with [facility]- liaison notified for start of care  DME set up w/ [employer]- cane has been delivered- per bedside RN sister plans to purchase RW from outside provider.  Sister to transport home, No further TOC needs noted.  1130- spoke with pt and sister at bedside discussed discharge plan for today- both pt and sister voiced that pt will be going to sister's home: Stanley [name] [address] [place] [address] Phone contacts- [phone] or sister's [phone]    Final next level of care: Home w Home Health Services Barriers to Discharge: Barriers Resolved   Patient Goals and CMS Choice CMS Medicare.gov Compare Post Acute Care list provided to:: Patient Represenative (must comment) (daughter, Toni Amend) Choice offered to / list presented to : Adult Children  Discharge Placement                 Home w/ [facility]        Discharge Plan and Services Additional resources added to the After Visit Summary for     Discharge Planning Services: CM Consult Post Acute Care Choice: Home Health          DME Arranged: Gilmer Mor DME Agency: [employer] Date DME Agency Contacted: 06/15/22   Representative spoke with at DME Agency: Vaughan Basta HH Arranged: RN, PT [facility] Agency: [facility] Date [facility] Agency Contacted: 06/15/22 Time HH Agency Contacted: 1413 Representative spoke with at [facility] Agency: Lorenza Chick  Social Determinants of Health (SDOH) Interventions SDOH Screenings   Food Insecurity: Food  Insecurity Present (03/21/2018)  Housing: Patient Declined (06/11/2022)  Transportation Needs: No Transportation Needs (06/11/2022)  Utilities: Not At Risk (06/11/2022)  Alcohol Screen: Low Risk  (02/07/2019)  Depression (PHQ2-9): High Risk (02/11/2022)  Financial Resource Strain: Medium Risk (03/21/2018)  Physical Activity: Unknown (03/21/2018)  Social Connections: Unknown (03/21/2018)  Stress: Stress Concern Present (03/21/2018)  Tobacco Use: Low Risk  (06/12/2022)     Readmission Risk Interventions    06/16/2022   11:13 AM 06/11/2022   11:32 AM  Readmission Risk Prevention Plan  Transportation Screening  Complete  PCP or Specialist Appt within 5-7 Days Complete   Home Care Screening Complete   Medication Review (RN CM) Complete   HRI or Home Care Consult  Complete  Social Work Consult for Recovery Care Planning/Counseling  Complete  Palliative Care Screening  Not Applicable  Medication Review Oceanographer)  Referral to Pharmacy

## 2022-06-16 NOTE — Progress Notes (Signed)
   06/15/22 2312  BiPAP/CPAP/SIPAP  Reason BIPAP/CPAP not in use Non-compliant

## 2022-06-16 NOTE — Progress Notes (Addendum)
Physical Therapy Treatment Patient Details Name: Stanley [name] MRN: [id number] DOB: May 18, 1965 Today's Date: 06/16/2022   History of Present Illness Pt is 57 year old presented to [facility] on  06/08/22 for ICD shocks for Vtach and intoxication. On 6/4 developed acute respiratory failure, acute pulmonary edema, and etoh withdrawal. 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  He demonstrates improved safety and able to perform cognitive task with activity (defer to OT for further cognitive assessment).  Pt ambulating 400' with and without cane with normal gait and VSS.  Noted Epley maneuver performed yesterday and pt reports significant improvement in dizziness - denies any dizziness. Pt is mod I to supervision level and does not require further acute PT services.  Will sign off.  Pt had d/c orders for today.  Recommendations for follow up therapy are one component of a multi-disciplinary discharge planning process, led by the attending physician.  Recommendations may be updated based on patient status, additional functional criteria and insurance authorization.  Follow Up Recommendations  Can patient physically be transported by private vehicle: Yes    Assistance Recommended at Discharge PRN  Patient can return home with the following Assistance with cooking/housework;Direct supervision/assist for medications management;Help with stairs or ramp for entrance   Equipment Recommendations  None recommended by PT    Recommendations for Other Services       Precautions / Restrictions Precautions Precautions: None     Mobility  Bed Mobility Overal bed mobility: Independent Bed Mobility: Supine to Sit, Sit to Supine     Supine to sit: Independent Sit to supine: Independent        Transfers Overall transfer level: Modified  independent Equipment used: Straight cane Transfers: Sit to/from Stand Sit to Stand: Supervision           General transfer comment: No dizziness; demonstrated safely    Ambulation/Gait Ambulation/Gait assistance: Supervision Gait Distance (Feet): 400 Feet Assistive device: None, Straight cane Gait Pattern/deviations: WFL(Within Functional Limits) Gait velocity: normal     General Gait Details: Used cane at times but could also ambulate safely without   Stairs             Wheelchair Mobility    Modified Rankin (Stroke Patients Only)       Balance Overall balance assessment: Needs assistance Sitting-balance support: No upper extremity supported, Feet supported Sitting balance-Leahy Scale: Normal     Standing balance support: No upper extremity supported Standing balance-Leahy Scale: Good Standing balance comment: able to ambulate without UE support at times, stood and adjusted sheets                            Cognition Arousal/Alertness: Awake/alert Behavior During Therapy: WFL for tasks assessed/performed Overall Cognitive Status: Within Functional Limits for tasks assessed                                 General Comments: Good improvement; good safety awareness; able to wayfind back to room after 3 turns; said months in reverse without difficulty, easily named 3 animals with "c", and immediate recall of 3 items and 2/3 after 5 mins        Exercises  General Comments General comments (skin integrity, edema, etc.): VSS; Pt reports dizziness much better - denies dizziness with transfers, rolling, or when he leaned forward from sitting to get cane      Pertinent Vitals/Pain Pain Assessment Pain Assessment: No/denies pain    Home Living                          Prior Function            PT Goals (current goals can now be found in the care plan section) Progress towards PT goals: Progressing toward  goals    Frequency    Min 1X/week      PT Plan Discharge plan needs to be updated    Co-evaluation              AM-PAC PT "6 Clicks" Mobility   Outcome Measure  Help needed turning from your back to your side while in a flat bed without using bedrails?: None Help needed moving from lying on your back to sitting on the side of a flat bed without using bedrails?: None Help needed moving to and from a bed to a chair (including a wheelchair)?: None Help needed standing up from a chair using your arms (e.g., wheelchair or bedside chair)?: A Little Help needed to walk in hospital room?: A Little Help needed climbing 3-5 steps with a railing? : A Little 6 Click Score: 21    End of Session Equipment Utilized During Treatment: Gait belt Activity Tolerance: Patient tolerated treatment well Patient left: with call bell/phone within reach;in bed Nurse Communication: Mobility status PT Visit Diagnosis: Unsteadiness on feet (R26.81);Other abnormalities of gait and mobility (R26.89);Muscle weakness (generalized) (M62.81)     Time: 1010-1020 PT Time Calculation (min) (ACUTE ONLY): 10 min  Charges:  $Gait Training: 8-22 mins                     Stanley [name], PT Acute Rehab [facility] [phone]    Stanley [name] 06/16/2022, 10:57 AM

## 2022-06-16 NOTE — Care Management Important Message (Signed)
Important Message  Patient Details  Name: Stanley [name] MRN: [id number] Date of Birth: [birth year]   Medicare Important Message Given:  Yes     [name] Stefan Church 06/16/2022, 3:05 PM

## 2022-06-17 ENCOUNTER — Telehealth: Payer: Self-pay

## 2022-06-17 NOTE — Telephone Encounter (Signed)
Pt called in to schedule hosp fu, dischrged yesterday from [facility], no appt showing until July

## 2022-06-17 NOTE — Transitions of Care (Post Inpatient/ED Visit) (Signed)
06/17/2022  Name: Stanley [name] MRN: [id number] DOB: Jun 14, 1965  Today's TOC FU Call Status: Today's TOC FU Call Status:: Successful TOC FU Call Competed Unsuccessful Call (1st Attempt) Date: 06/17/22 [facility] FU Call Complete Date: 06/17/22  Transition Care Management Follow-up Telephone Call Date of Discharge: 06/16/22 Discharge Facility: [facility]) Type of Discharge: Inpatient Admission Primary Inpatient Discharge Diagnosis:: AICD discharge How have you been since you were released from the hospital?: Better Any questions or concerns?: Yes Patient Questions/Concerns:: He only has Medicare part A and said he had part B also but it was too expensive so he dropped it.  He said he hasn't applied for Medicaid yet.  I told him that I can refer him to the DSS Medicaid eligibility case worker to assist with submitting an application and he was agreeable to that. He understands he may not qualify because his income is about $1500/month.  he also understands that he may need to contact Social Security about re-applying for Medicare Part B Patient Questions/Concerns Addressed: Other: (referred to Medicaid eligibility case workers)  Items Reviewed: Did you receive and understand the discharge instructions provided?: Yes Medications obtained,verified, and reconciled?: No Medications Not Reviewed Reasons:: Other: (He said he has all medications and did not have any questions about the meds and he did not need to review the med list.) Any new allergies since your discharge?: No Dietary orders reviewed?: Yes Type of Diet Ordered:: heart healthy, low sodium. He requested information about the diet be emailed to him Do you have support at home?: Yes People in Home: sibling(s) Name of Support/Comfort Primary Source: he is currently staying with his sister  Medications Reviewed Today: Medications Reviewed Today     Reviewed by Card, Bryson Ha, CPhT (Pharmacy Technician) on 06/08/22 at 1734  Med List  Status: Complete   Medication Order Taking? Sig Documenting Provider Last Dose Status Informant  allopurinol (ZYLOPRIM) 300 MG tablet [id number] No Take 1 tablet (300 mg total) by mouth daily. [name], NP unknown Active Self  amiodarone (PACERONE) 200 MG tablet [id number] No Take 1 tablet (200 mg total) by mouth daily. Gailen Shelter, PA unknown Active Self  carvedilol (COREG) 25 MG tablet [id number] Yes TAKE 1 TABLET(25 MG) BY MOUTH TWICE DAILY WITH A MEAL  Patient taking differently: Take 25 mg by mouth 2 (two) times daily with a meal.   [name], NP 06/07/2022 08:00am Active Self  digoxin (LANOXIN) 0.125 MG tablet [id number] Yes TAKE 1/2 TABLET EVERY DAY (DOSE CHANGE)  Patient taking differently: Take 0.0625 mg by mouth daily.   Bensimhon, Bevelyn Buckles, MD Past Week Active Self  furosemide (LASIX) 80 MG tablet [id number] Yes TAKE 1 TABLET BY MOUTH ON MONDAY, WEDNESDAY& FRIDAY. MAY TAKE 1 TABLET AS NEEDED FOR SWELLING Tillery, Mariam Dollar, PA-C Past Week Active Self  nitroGLYCERIN (NITROSTAT) 0.4 MG SL tablet [id number]  Place 1 tablet (0.4 mg total) under the tongue every 5 (five) minutes x 3 doses as needed for chest pain. Sheilah Pigeon, PA-C  Active Self  potassium chloride SA (KLOR-CON) 20 MEQ tablet [id number] Yes Take 3 tablets (60 mEq total) by mouth daily. Graciella Freer, PA-C 06/07/2022 Active Self  sodium chloride (OCEAN) 0.65 % SOLN nasal spray [id number] Yes Place 1 spray into both nostrils daily as needed for congestion. [provider] 06/07/2022 Active Self  traZODone (DESYREL) 150 MG tablet [id number] Yes Take 1 tablet (150 mg total) by mouth at bedtime. [name], NP Past Week  Active Self            Home Care and Equipment/Supplies: Were Home Health Services Ordered?: Yes Name of Home Health Agency:: [facility] Has Agency set up a time to come to your home?: No EMR reviewed for Home Health Orders:  (He recevied a call from [facility] and they said  they would call him back to schedule a visit later this week.) Any new equipment or medical supplies ordered?: Yes Name of Medical supply agency?: [employer], [facility] Were you able to get the equipment/medical supplies?: Yes Do you have any questions related to the use of the equipment/supplies?: No  Functional Questionnaire: Do you need assistance with bathing/showering or dressing?: No Do you need assistance with meal preparation?: Yes (his sister is helping for now) Do you need assistance with eating?: No Do you have difficulty maintaining continence: No Do you need assistance with getting out of bed/getting out of a chair/moving?: Yes (He is ambulating with a cane in the house and with a RW when he goes out.) Do you have difficulty managing or taking your medications?: No  Follow up appointments reviewed: PCP Follow-up appointment confirmed?: Yes Date of PCP follow-up appointment?: 06/29/22 Follow-up Provider: Bertram Denver, NP Specialist Hospital Follow-up appointment confirmed?: Yes Date of Specialist follow-up appointment?: 06/24/22 Follow-Up Specialty Provider:: cardiology,, [facility] Do you need transportation to your follow-up appointment?: Yes Transportation Need Intervention Addressed By:: Other: (He has been instructed to call [facility] if he is unable to arrange a ride and needs the clinc to schedule a cab for him.) Do you understand care options if your condition(s) worsen?: Yes-patient verbalized understanding    SIGNATURE  Robyne Peers, RN

## 2022-06-17 NOTE — Transitions of Care (Post Inpatient/ED Visit) (Signed)
   06/17/2022  Name: Stanley [name] MRN: [id number] DOB: August 22, 1965  Today's TOC FU Call Status: Today's TOC FU Call Status:: Unsuccessul Call (1st Attempt) Unsuccessful Call (1st Attempt) Date: 06/17/22  Attempted to reach the patient regarding the most recent Inpatient/ED visit.  Follow Up Plan: Additional outreach attempts will be made to reach the patient to complete the Transitions of Care (Post Inpatient/ED visit) call.   Signature  Robyne Peers, RN

## 2022-06-18 ENCOUNTER — Other Ambulatory Visit: Payer: Self-pay

## 2022-06-19 ENCOUNTER — Telehealth: Payer: Self-pay | Admitting: Nurse Practitioner

## 2022-06-19 ENCOUNTER — Encounter: Payer: Self-pay | Admitting: Nurse Practitioner

## 2022-06-19 NOTE — Telephone Encounter (Signed)
Home Health Verbal Orders - Caller/Agency: Tamsen Roers from [facility]  Callback Number: [phone] Requesting OT/PT/Skilled Nursing/Social Work/Speech Therapy: N/A Frequency: N/A  Tamsen Roers from [facility] stated pt was referred for PT start of care was seen today. Pt is not homebound and will not be admitted. Onalee Hua stated he recommends referring him to cardiac rehab. Vital signs were stable today and does not qualify for home health.   Please advise.

## 2022-06-19 NOTE — Telephone Encounter (Signed)
Noted  

## 2022-06-21 NOTE — Telephone Encounter (Signed)
Erskine Squibb can you discuss transportation with Stanley [name]. I don't even see medicaid on his profile or medicare B

## 2022-06-23 ENCOUNTER — Telehealth (HOSPITAL_COMMUNITY): Payer: Self-pay | Admitting: Licensed Clinical Social Worker

## 2022-06-23 NOTE — Progress Notes (Signed)
ADVANCED HF CLINIC NOTE  PCP: [name], NP EP: Dr. Lalla Brothers HF Cardiologist: Dr. Gala Romney  HPI:  Stanley [name] is a 57 y.o. male with systolic HF due to NICM d/x'd in 2006 (felt 2/2 HTN/ETOH), ? CAD s/p PCI to unknown vessel 2011 at outside location (cath 2014 without CAD and normal coronaries by CT 07/2016), VT s/p [facility] ICD implant ([place] 2015), noncompliance, chronic noncardiac chest pain, depression, HTN and ETOH abuse.    In 2017 he received inappropriate therapy for ST with ATP (after a work out at [employer] and reported noncompliance with meds) that degenerated into VT and received several shocks. He has had issues with chronic chest pain without evidence for ischemic etiology including normal cath and cardiac CT as above. This is relatively unchanged. Echo 03/2016  EF 35-40%, grade 1 DD, mild MR, moderate LAE  Had recurrent ICD shock in 3/20 in setting of medicine noncompliance. Amio continued. Echo 3/20 EF 25-30% Normal RV.   Echo 3/21: EF 25-30% RV ok S/p Barostim 7/21 Myoview 5/22: EF 34% fixed inferior defect  Admitted 6/24 with recurrent VT. 2/2 intoxication and metabolic derangement (K 3.1, Mag 1.2). Started on amiodarone gtt. Echo showed EF < 20%, RV mildly reduced. Required pressors and ICU monitoring due to worsening respiratory failure and ETOH withdrawal. Underwent RHC showing elevated RA pressures, normal PCWP, mild to moderate PAH and decreased output with CI (Thermo) 1.7. DBA gtt started. Drips weaned and amio transitioned to oral. GDMT titrated, but limited by AKI. He was discharged home, weight 136 lbs.  Today he returns for post hospital HF follow up. Overall feeling fine. He is not SOB with any activity. Denies abnormal bleeding, palpitations, CP, dizziness, edema, or PND/Orthopnea. Appetite ok. No fever or chills. Weight at home 137 pounds. Taking all medications. He does not wear CPAP, unable to tolerate mask.  No tobacco use, no further ETOH since  discharge. Sees PCP soon, hoping to started medication for ETOH use disorder (anabuse?). Living with sister now but will be going back to his apartment next week. He has food insecurity.   Cardiac Studies - RHC (6/24): RA mean 10, PA 50/23 (mean 34), PCWP mean 11, CO/CI (Fick) 3.87/2.25, CO/CI (thermo) 2.92/1.78,  PVR 5.9  WU, PAPi 2.7  - Echo (6/24): EF < 20%, grade I DD, RV mildly reduced, moderate MR, moderate to severe TR  - Myoview (5/22): EF 34% fixed inferior defect  - S/p Barostim (7/21)  - Echo (3/21): EF 25-30% RV ok  - CPX (09/19/18) FVC 1.96 (57%)      FEV1 1.60 (58%)        FEV1/FVC 81 (101%)        MVV 80 (61%)  Resting HR: 77 Peak HR: 131   (79% age predicted max HR)  BP rest: 168/80          Standing BP: 156/74 BP peak: 196/80   Peak VO2: 14.9 (52% predicted peak VO2)  VE/VCO2 slope:  31  OUES: 1.43  Peak RER: 1.05  VE/MVV:  50% O2pulse:  10   (67% predicted O2pulse)  Moderate to severe HF limitation but normal VeVCO2 slope is reassuring. Restrictive lung physiology.   - Echo (3/20) EF 25-30% Normal RV.   - Echo (3/18):  EF 35-40%, grade 1 DD, mild MR, moderate LAE  Past Medical History:  Diagnosis Date   Acute renal failure (ARF) (HCC)    Acute respiratory failure with hypoxia (HCC) 12/29/2018   Alcohol  abuse    Anxiety    CAD (coronary artery disease)    a. dx unclear, reported PCI in 2011 but cath 2014 normal coronaries and cardiac CT 07/2016 normal coronaries, no mention of stents.   Chronic chest pain    Chronic systolic CHF (congestive heart failure) (HCC)    Depression    Dyspnea    07/05/2019: per patient some times with exertion, "has to catch breath"   Family history of breast cancer 05/30/2021   Family history of pancreatic cancer 05/30/2021   Financial difficulties    Gout    Gouty arthritis    Headache    History of noncompliance with medical treatment    financial challenges   Hypertension    ICD (implantable cardioverter-defibrillator)  in place    [employer] ICD implant done in [place] 2015   Insomnia    Lobar pneumonia (HCC) 12/28/2018   Myocardial infarction (HCC) 2005   Nonischemic cardiomyopathy (HCC)    Sleep apnea    per patient, has CPAP does not wear it every night   Ventricular tachycardia (HCC) 01/2015   2017 - ATP delivered for sinus tach, degenerated into VT for which he received shock. Recurrent VT 03/2018.   Current Outpatient Medications  Medication Sig Dispense Refill   amiodarone (PACERONE) 200 MG tablet Take 2 tablets (400 mg total) by mouth 2 (two) times daily for 7 days, THEN 1 tablet (200 mg total) 2 (two) times daily for 7 days, THEN 1 tablet (200 mg total) daily thereafter. 74 tablet 6   atorvastatin (LIPITOR) 20 MG tablet Take 1 tablet (20 mg total) by mouth daily. 90 tablet 3   digoxin (LANOXIN) 0.125 MG tablet Take 0.5 tablets (0.0625 mg total) by mouth daily. 15 tablet 5   empagliflozin (JARDIANCE) 10 MG TABS tablet Take 1 tablet (10 mg total) by mouth daily. 30 tablet 5   furosemide (LASIX) 80 MG tablet MAY TAKE 1 TABLET AS NEEDED FOR SWELLING 45 tablet 5   nitroGLYCERIN (NITROSTAT) 0.4 MG SL tablet Place 1 tablet (0.4 mg total) under the tongue every 5 (five) minutes for 3 doses as needed for chest pain. 25 tablet 6   spironolactone (ALDACTONE) 25 MG tablet Take 1 tablet (25 mg total) by mouth daily. 30 tablet 5   traZODone (DESYREL) 150 MG tablet Take 1 tablet (150 mg total) by mouth at bedtime. 90 tablet 1   No current facility-administered medications for this encounter.   Allergies  Allergen Reactions   Lisinopril Shortness Of Breath    Makes lips swell   Allopurinol Nausea Only    dizziness   Entresto [Sacubitril-Valsartan]     History of angioedema   Other Swelling    Nuts - Lip Swelling   Shellfish Allergy Swelling    Lip Swelling   Social History   Socioeconomic History   Marital status: Divorced    Spouse name: Not on file   Number of children: 1   Years of education: 12    Highest education level: Not on file  Occupational History   Occupation: Disability  Tobacco Use   Smoking status: Never   Smokeless tobacco: Never  Vaping Use   Vaping Use: Never used  Substance and Sexual Activity   Alcohol use: Not Currently    Comment: Admits to ongoing heavy drinking   Drug use: No   Sexual activity: Not Currently  Other Topics Concern   Not on file  Social History Narrative   Lives in [place]  alone.  Disabled.  Previously worked as a Geophysicist/field seismologist.   Fun: Rest, Read and watch movies.    Social Determinants of Health   Financial Resource Strain: Medium Risk (03/21/2018)   Overall Financial Resource Strain (CARDIA)    Difficulty of Paying Living Expenses: Somewhat hard  Food Insecurity: Food Insecurity Present (03/21/2018)   Hunger Vital Sign    Worried About Running Out of Food in the Last Year: Sometimes true    Ran Out of Food in the Last Year: Sometimes true  Transportation Needs: No Transportation Needs (06/11/2022)   PRAPARE - Administrator, Civil Service (Medical): No    Lack of Transportation (Non-Medical): No  Physical Activity: Unknown (03/21/2018)   Exercise Vital Sign    Days of Exercise per Week: Patient declined    Minutes of Exercise per Session: Patient declined  Stress: Stress Concern Present (03/21/2018)   [employer] of Occupational Health - Occupational Stress Questionnaire    Feeling of Stress : To some extent  Social Connections: Unknown (03/21/2018)   Social Connection and Isolation Panel [NHANES]    Frequency of Communication with Friends and Family: Patient declined    Frequency of Social Gatherings with Friends and Family: Patient declined    Attends Religious Services: Patient declined    Database administrator or Organizations: Patient declined    Attends Banker Meetings: Patient declined    Marital Status: Patient declined  Intimate Partner Violence: Not At Risk (06/11/2022)    Humiliation, Afraid, Rape, and Kick questionnaire    Fear of Current or Ex-Partner: No    Emotionally Abused: No    Physically Abused: No    Sexually Abused: No   Family History  Problem Relation Age of Onset   Breast cancer Mother    Pancreatic cancer Mother    Hypertension Father    Alzheimer's disease Father    Gout Father    Dementia Father    Hypertension Sister    Clotting disorder Sister    Obesity Brother    Bronchitis Brother    Cancer Maternal Uncle        unknown type; dx after 64; x2 maternal uncles   Breast cancer Paternal Aunt        dx after 66   Lung cancer Paternal Uncle    Heart disease Maternal Grandmother    Gout Paternal Grandfather    Colon polyps Neg Hx    Colon cancer Neg Hx    Esophageal cancer Neg Hx    Stomach cancer Neg Hx    BP 110/70   Pulse 73   Wt 64.4 kg (142 lb)   SpO2 98%   BMI 23.63 kg/m   Wt Readings from Last 3 Encounters:  06/24/22 64.4 kg (142 lb)  06/16/22 61.8 kg (136 lb 4.8 oz)  02/11/22 73.7 kg (162 lb 6.4 oz)   PHYSICAL EXAM: General:  NAD. No resp difficulty, walked into clinic HEENT: Normal Neck: Supple. No JVD. Carotids 2+ bilat; no bruits. No lymphadenopathy or thryomegaly appreciated. Cor: PMI nondisplaced. Regular rate & rhythm. No rubs, gallops or murmurs. Lungs: Clear Abdomen: Soft, nontender, nondistended. No hepatosplenomegaly. No bruits or masses. Good bowel sounds. Extremities: No cyanosis, clubbing, rash, edema Neuro: Alert & oriented x 3, cranial nerves grossly intact. Moves all 4 extremities w/o difficulty. Affect pleasant.  ECG (personally reviewed): artifact from barostim, NSR with IVCD QRS 136 msec  Device interrogation (personally reviewed): no further VT since 06/08/22, average HR  71 bpm, 3.3 hr/day activity  ASSESSMENT & PLAN: 1. VT  - Shocks from [employer] ICD (06/08/22), 2/2 hypomag and hypokalemia, ETOH abuse and medication noncompliance. - No prior chest pain, prior coronary workups  have shown no significant coronary disease.  - Continue amiodarone taper, per EP recs - No further VT on device interrogation today - Check BMET and Mag today. - No driving x 6 months  2. Chronic systolic CHF - Mostly NICM (? H/o PCI to unknown vessel in 2011.) CTA 2018 normal cors - s/p [employer] ICD - Echo (3/20): EF 25-30% - CPX 9/20 moderate to severe HF limitation. pVO2: 14.9 (52% predicted peak VO2).  slope: 31  pRER: 1.05  - Echo (03/10/19): EF 25-30% RV ok Mild MR - Myoview (5/22): EF 34% fixed inferior defect - Echo (6/24): EF < 20%, RV mildly reduced (in setting of VT), moderate-severe TR, moderate MR.  - RHC (6/24): RA 10, PCWP 11, CO/CI (Thermo) 2.9/1.78. >>started on DBA - CMP likely 2/2 ETOH. - Stable NYHA I. Volume looks good today. - Continue digoxin 0.0625 mg daily. - Continue spironolactone 25 mg daily. - Continue Lasix 40 mg PRN - Continue Jardiance 10 mg daily. - No ARNi/ACE (angioedema with Entresto and lisinopril). - Not candidate for advanced therapies at this time with heavy ETOH abuse.  - Labs today. - Repeat echo after back on GDMT  3. ETOH abuse - Heavy ETOH use, likely contributes to cardiomyopathy.  - Stopped since discharge. Congratulated - Encouraged him to have a plan to stay sober  4. CKD 3  - Baseline SCr 1.3-1.6 - Continue SGLT2i - Labs today.  5. HTN - BP controlled. - GDMT as above  6. CAD - h/o single vessel PCI by reo - Myoview (5/22): EF 34% fixed inferior defect - No chest pain - Continue ASA - Continue statin  7. OSA - Unable to tolerate CPAP - Discuss referral to sleep medicine next visit  8. SDOH - HFSW helping with transportation & resources - He has Medicare  Follow up in 3-4 weeks with PharmD (add losartan), 6 weeks with APP (add beta blocker) and 3 months with Dr. Gala Romney + echo  [place], FNP-BC 06/24/22

## 2022-06-23 NOTE — Telephone Encounter (Signed)
H&V Care Navigation CSW Progress Note  Clinical Social Worker informed by front desk staff that pt in need of a ride for tomorrows appt.  CSW called pt who confirms he needs help with a ride.  CSW able to arrange cab to pick him up at 11am tomorrow.  CSW will plan to assess for other resources during visit   SDOH Screenings   Food Insecurity: Food Insecurity Present (03/21/2018)  Housing: Patient Declined (06/11/2022)  Transportation Needs: No Transportation Needs (06/11/2022)  Utilities: Not At Risk (06/11/2022)  Alcohol Screen: Low Risk  (02/07/2019)  Depression (PHQ2-9): High Risk (02/11/2022)  Financial Resource Strain: Medium Risk (03/21/2018)  Physical Activity: Unknown (03/21/2018)  Social Connections: Unknown (03/21/2018)  Stress: Stress Concern Present (03/21/2018)  Tobacco Use: Low Risk  (06/12/2022)    Burna Sis, LCSW Clinical Social Worker [facility] Desk#: [phone] Cell#: [phone]

## 2022-06-24 ENCOUNTER — Ambulatory Visit (HOSPITAL_COMMUNITY)
Admit: 2022-06-24 | Discharge: 2022-06-24 | Disposition: A | Discharge status: Home / Self Care | Payer: Self-pay | Attending: Family Medicine | Admitting: Family Medicine

## 2022-06-24 ENCOUNTER — Ambulatory Visit (INDEPENDENT_AMBULATORY_CARE_PROVIDER_SITE_OTHER): Payer: Medicaid Other

## 2022-06-24 ENCOUNTER — Encounter (HOSPITAL_COMMUNITY): Payer: Self-pay

## 2022-06-24 VITALS — BP 110/70 | HR 73 | Wt 142.0 lb

## 2022-06-24 DIAGNOSIS — Z95810 Presence of automatic (implantable) cardiac defibrillator: Secondary | ICD-10-CM | POA: Insufficient documentation

## 2022-06-24 DIAGNOSIS — N183 Chronic kidney disease, stage 3 unspecified: Secondary | ICD-10-CM | POA: Insufficient documentation

## 2022-06-24 DIAGNOSIS — G4733 Obstructive sleep apnea (adult) (pediatric): Secondary | ICD-10-CM | POA: Insufficient documentation

## 2022-06-24 DIAGNOSIS — I5022 Chronic systolic (congestive) heart failure: Secondary | ICD-10-CM | POA: Insufficient documentation

## 2022-06-24 DIAGNOSIS — I428 Other cardiomyopathies: Secondary | ICD-10-CM

## 2022-06-24 DIAGNOSIS — Z5941 Food insecurity: Secondary | ICD-10-CM | POA: Insufficient documentation

## 2022-06-24 DIAGNOSIS — Z91148 Patient's other noncompliance with medication regimen for other reason: Secondary | ICD-10-CM | POA: Insufficient documentation

## 2022-06-24 DIAGNOSIS — Z7982 Long term (current) use of aspirin: Secondary | ICD-10-CM | POA: Insufficient documentation

## 2022-06-24 DIAGNOSIS — I472 Ventricular tachycardia, unspecified: Secondary | ICD-10-CM | POA: Insufficient documentation

## 2022-06-24 DIAGNOSIS — Z8249 Family history of ischemic heart disease and other diseases of the circulatory system: Secondary | ICD-10-CM | POA: Insufficient documentation

## 2022-06-24 DIAGNOSIS — Z79899 Other long term (current) drug therapy: Secondary | ICD-10-CM | POA: Insufficient documentation

## 2022-06-24 DIAGNOSIS — Z139 Encounter for screening, unspecified: Secondary | ICD-10-CM

## 2022-06-24 DIAGNOSIS — I130 Hypertensive heart and chronic kidney disease with heart failure and stage 1 through stage 4 chronic kidney disease, or unspecified chronic kidney disease: Secondary | ICD-10-CM | POA: Insufficient documentation

## 2022-06-24 DIAGNOSIS — Z7984 Long term (current) use of oral hypoglycemic drugs: Secondary | ICD-10-CM | POA: Insufficient documentation

## 2022-06-24 DIAGNOSIS — N1831 Chronic kidney disease, stage 3a: Secondary | ICD-10-CM

## 2022-06-24 DIAGNOSIS — I2510 Atherosclerotic heart disease of native coronary artery without angina pectoris: Secondary | ICD-10-CM | POA: Insufficient documentation

## 2022-06-24 DIAGNOSIS — F1010 Alcohol abuse, uncomplicated: Secondary | ICD-10-CM | POA: Insufficient documentation

## 2022-06-24 DIAGNOSIS — Z5986 Financial insecurity: Secondary | ICD-10-CM | POA: Insufficient documentation

## 2022-06-24 DIAGNOSIS — I081 Rheumatic disorders of both mitral and tricuspid valves: Secondary | ICD-10-CM | POA: Insufficient documentation

## 2022-06-24 DIAGNOSIS — I10 Essential (primary) hypertension: Secondary | ICD-10-CM

## 2022-06-24 DIAGNOSIS — E876 Hypokalemia: Secondary | ICD-10-CM | POA: Insufficient documentation

## 2022-06-24 LAB — CUP PACEART REMOTE DEVICE CHECK
Battery Remaining Longevity: 72 mo
Battery Remaining Percentage: 63 %
Brady Statistic RV Percent Paced: 0 %
Date Time Interrogation Session: 20240619045300
HighPow Impedance: 49 Ohm
Implantable Lead Connection Status: 753985
Implantable Lead Implant Date: 20150601
Implantable Lead Location: 753860
Implantable Lead Model: 295
Implantable Lead Serial Number: 134196
Implantable Pulse Generator Implant Date: 20150601
Lead Channel Impedance Value: 378 Ohm
Lead Channel Pacing Threshold Amplitude: 1.5 V
Lead Channel Pacing Threshold Pulse Width: 0.6 ms
Lead Channel Setting Pacing Amplitude: 3 V
Lead Channel Setting Pacing Pulse Width: 0.6 ms
Lead Channel Setting Sensing Sensitivity: 0.6 mV
Pulse Gen Serial Number: 190689
Zone Setting Status: 755011

## 2022-06-24 LAB — MAGNESIUM: Magnesium: 1.9 mg/dL (ref 1.7–2.4)

## 2022-06-24 LAB — BASIC METABOLIC PANEL
Anion gap: 10 (ref 5–15)
BUN: 21 mg/dL — ABNORMAL HIGH (ref 6–20)
CO2: 20 mmol/L — ABNORMAL LOW (ref 22–32)
Calcium: 9.5 mg/dL (ref 8.9–10.3)
Chloride: 102 mmol/L (ref 98–111)
Creatinine, Ser: 1.39 mg/dL — ABNORMAL HIGH (ref 0.61–1.24)
GFR, Estimated: 59 mL/min — ABNORMAL LOW (ref >60)
Glucose, Bld: 88 mg/dL (ref 70–99)
Potassium: 4.2 mmol/L (ref 3.5–5.1)
Sodium: 132 mmol/L — ABNORMAL LOW (ref 135–145)

## 2022-06-24 LAB — DIGOXIN LEVEL: Digoxin Level: 0.2 ng/mL — ABNORMAL LOW (ref 0.8–2.0)

## 2022-06-24 LAB — BRAIN NATRIURETIC PEPTIDE: B Natriuretic Peptide: 175.9 pg/mL — ABNORMAL HIGH (ref 0.0–100.0)

## 2022-06-24 MED ORDER — EMPAGLIFLOZIN 10 MG PO TABS: 10.0000 mg | ORAL_TABLET | Freq: Every day | ORAL | 5 refills | Status: DC

## 2022-06-24 MED ORDER — TRAZODONE HCL 150 MG PO TABS
150.0000 mg | ORAL_TABLET | Freq: Every day | ORAL | 1 refills | Status: DC
Start: 2022-06-24 — End: 2022-09-17

## 2022-06-24 MED ORDER — AMIODARONE HCL 200 MG PO TABS: ORAL_TABLET | ORAL | 6 refills | Status: DC

## 2022-06-24 MED ORDER — NITROGLYCERIN 0.4 MG SL SUBL
0.4000 mg | SUBLINGUAL_TABLET | SUBLINGUAL | 6 refills | Status: DC | PRN
Start: 2022-06-24 — End: 2022-07-14

## 2022-06-24 MED ORDER — FUROSEMIDE 80 MG PO TABS: ORAL_TABLET | ORAL | 5 refills | Status: DC

## 2022-06-24 MED ORDER — SPIRONOLACTONE 25 MG PO TABS: 25.0000 mg | ORAL_TABLET | Freq: Every day | ORAL | 5 refills | Status: DC

## 2022-06-24 MED ORDER — DIGOXIN 125 MCG PO TABS: 0.0625 mg | ORAL_TABLET | Freq: Every day | ORAL | 5 refills | Status: DC

## 2022-06-24 NOTE — Patient Instructions (Addendum)
Medication Changes:  We recommend that you continue on your current medications as directed. Please refer to the Current Medication list given to you today.   *If you need a refill on your cardiac medications before your next appointment, please call your pharmacy*  Lab Work:  Labs done today, your results will be available in MyChart, we will contact you for abnormal readings.  Your physician has requested that you have an echocardiogram. Echocardiography is a painless test that uses sound waves to create images of your heart. It provides your doctor with information about the size and shape of your heart and how well your heart's chambers and valves are working. This procedure takes approximately one hour. There are no restrictions for this procedure. Please do NOT wear cologne, perfume, aftershave, or lotions (deodorant is allowed). Please arrive 15 minutes prior to your appointment time.   Follow-Up in:   Your physician recommends that you schedule a follow-up appointment in: 3 weeks with Pharmacy.   6 weeks with APP   3 months with Dr. Gala Romney with an ECHO.    Do the following things EVERYDAY: Weigh yourself in the morning before breakfast. Write it down and keep it in a log. Take your medicines as prescribed Eat low salt foods--Limit salt (sodium) to 2000 mg per day.  Stay as active as you can everyday Limit all fluids for the day to less than 2 liters    Need to Contact us:  If you have any questions or concerns before your next appointment please send [place] a message through [place] or call our office at [phone].    TO LEAVE A MESSAGE FOR THE NURSE SELECT OPTION 2, PLEASE LEAVE A MESSAGE INCLUDING: YOUR NAME DATE OF BIRTH CALL BACK NUMBER REASON FOR CALL**this is important as we prioritize the call backs  YOU WILL RECEIVE A CALL BACK THE SAME DAY AS LONG AS YOU CALL BEFORE 4:00 PM   At the [facility], you and your health needs are our  priority. As part of our continuing mission to provide you with exceptional heart care, we have created designated Provider Care Teams. These Care Teams include your primary Cardiologist (physician) and Advanced Practice Providers (APPs- Physician Assistants and Nurse Practitioners) who all work together to provide you with the care you need, when you need it.   You may see any of the following providers on your designated Care Team at your next follow up: Dr Arvilla Meres Dr Marca Ancona Dr. Marcos Eke, NP Robbie Lis, [facility] [place], [place] Brynda Peon, NP Karle Plumber, PharmD   Please be sure to bring in all your medications bottles to every appointment.    Thank you for choosing [facility]

## 2022-06-24 NOTE — Addendum Note (Signed)
Encounter addended by: Burna Sis, LCSW on: 06/24/2022 2:46 PM  Actions taken: Clinical Note Signed

## 2022-06-24 NOTE — Progress Notes (Signed)
Heart and Vascular Care Navigation  06/24/2022  Stanley [name] [birth year] [id number]  Reason for Referral: CSW met with pt to discuss transportation resources.   Engaged with patient face to face for initial visit for Heart and Vascular Care Coordination.                                                                                                   Assessment:   Pt reports he has his own car but that our team has told him not to drive for 6 months because of his ICD shock.  Pt has family in this area but they work so their ability to assist is limited.  Pt reports he is close to a bus stop and feels as if he can use this but would be hard for longer distances.  CSW provided resources for transportation including senior wheels to help him get to appts.  Pt not appropriate for [employer] [place] as he is physically able to use the normal bus line.  Pt expressed financial concerns during our conversation.  States he is up to date on rent but that he is $275 behind on energy bill.  CSW discussed patient care fund assistance and what forms we would need in order to move forward.  He will plan to bring to appt next week with CHW and CSW will come get from him.  Pt has Medicare part A but no other coverage.  [place] that he lost his Medicaid because he did not complete recertification paperwork on time- planning to apply.  Pt agreeable to CSW sending message to [facility] workers to help him apply over the phone.  Also provided pt SHIIP number to discuss UHC Dual Complete plan as they would provide $300 u-card to help with food and utilities.  Pt admits that current housing is somewhat too expensive for him- $680/month.  CSW mailing affordable housing list for seniors to patient to start getting on waitlist.                                  HRT/VAS Care Coordination     Patients Home Cardiology Office [facility] Failure Clinic   Outpatient Care Team Social Worker   Social Worker Name:  Naaman Plummer, [phone]   Living arrangements for the past 2 months Apartment   Lives with: Self   Home Assistive Devices/Equipment [place] (specify quad or straight)   DME Agency [employer]   [facility] Agency [name]   Current home services DME  Per sister patient has a cane that's broken.       Social History:  SDOH Screenings   Food Insecurity: Food Insecurity Present (06/24/2022)  Housing: Patient Declined (06/24/2022)  Transportation Needs: Unmet Transportation Needs (06/24/2022)  Utilities: Not At Risk (06/11/2022)  Alcohol Screen: Low Risk  (02/07/2019)  Depression (PHQ2-9): High Risk (02/11/2022)  Financial Resource Strain: Medium Risk (06/24/2022)  Physical Activity: Unknown (03/21/2018)  Social Connections: Unknown (03/21/2018)  Stress: Stress Concern Present (03/21/2018)  Tobacco Use: Low Risk  (06/24/2022)    SDOH Interventions: Financial Resources:  Financial Strain Interventions: Intervention Not Indicated Receives $1,400/month in disability  Food Insecurity:  Gets $25/month from food stamps- worried about how to get food given lack of transportation  Housing Insecurity:  Housing Interventions: Intervention Not Indicated  Transportation:   Transportation Interventions: [employer] Given, Other (Comment) (provided list of local transportation resources) Pt has a car but told not to drive for 6 months due to ICD shock     Follow-up plan:    Pt to bring in necessary paperwork for patient care fund assistance on Monday for CSW to assist with [employer].  Will continue to follow and assist as needed  Burna Sis, LCSW Clinical Social Worker [facility] Desk#: [phone] Cell#: [phone]

## 2022-06-29 ENCOUNTER — Encounter: Payer: Self-pay | Admitting: Nurse Practitioner

## 2022-06-29 ENCOUNTER — Ambulatory Visit: Payer: Self-pay | Attending: Nurse Practitioner | Admitting: Nurse Practitioner

## 2022-06-29 VITALS — BP 108/68 | HR 71 | Ht 64.0 in | Wt 139.6 lb

## 2022-06-29 DIAGNOSIS — Z09 Encounter for follow-up examination after completed treatment for conditions other than malignant neoplasm: Secondary | ICD-10-CM

## 2022-06-29 NOTE — Progress Notes (Signed)
Advanced Heart Failure Clinic Note   PCP: [name], NP EP: Dr. Lalla Brothers HF Cardiologist: Dr. Gala Romney  HPI:  Stanley [name] is a 57 y.o. male with systolic HF due to NICM d/x'd in 2006 (felt 2/2 HTN/ETOH), ? CAD s/p PCI to unknown vessel 2011 at outside location (cath 2014 without CAD and normal coronaries by CT 07/2016), VT s/p [facility] ICD implant ([place] 2015), noncompliance, chronic noncardiac chest pain, depression, HTN and ETOH abuse.     In 2017 he received inappropriate therapy for ST with ATP (after a work out at [employer] and reported noncompliance with meds) that degenerated into VT and received several shocks. He has had issues with chronic chest pain without evidence for ischemic etiology including normal cath and cardiac CT as above. This is relatively unchanged. Echo 03/2016  EF 35-40%, grade 1 DD, mild MR, moderate LAE   Had recurrent ICD shock in 03/2018 in setting of medicine noncompliance. Amiodarone continued. Echo 03/2018 EF 25-30% Normal RV.    Echo 03/2019: EF 25-30% RV ok S/p Barostim 07/2019 Myoview 05/2020: EF 34% fixed inferior defect   Admitted 06/2022 with recurrent VT. 2/2 intoxication and metabolic derangement (K 3.1, Mag 1.2). Started on amiodarone gtt. Echo showed EF < 20%, RV mildly reduced. Required pressors and ICU monitoring due to worsening respiratory failure and ETOH withdrawal. Underwent RHC showing elevated RA pressures, normal PCWP, mild to moderate PH and decreased output with CI (Thermo) 1.7. DBA gtt started. Drips weaned and amiodarone transitioned to oral. GDMT titrated, but limited by AKI. He was discharged home, weight 136 lbs.   Returned to [facility] for post hospital HF follow up 06/24/22. Overall was feeling fine. He was not SOB with any activity. Denied abnormal bleeding, palpitations, CP, dizziness, edema, or PND/Orthopnea. Appetite was ok. No fever or chills. Weight at home was 137 pounds. Reported taking all medications. He had not been wearing  CPAP, unable to tolerate mask.  No tobacco use, no further ETOH since discharge. Sees PCP soon, hoping to started medication for ETOH use disorder (anabuse?). Was living with sister at time of appointment but planned to be going back to his apartment the following week. He had food insecurity.  Today he returns to HF clinic for pharmacist medication titration. At last visit with APP, no medication changes were made. Overall he is feeling well today. States he feels "excellent" and like a completely new person. Walks for 2 blocks around neighborhood every day. If pushes himself and walks longer, does get mild SOB and dizzy. Walks with his cane to help with balance. No CP or palpitations. Weight at home slowly increasing. Weight 132 lbs after discharge, now 135 lbs. His appetite has greatly improved and he is eating more. He has been taking Lasix 80 mg every other day. Has been needing more because drinking a lot of water/fluids.  No LEE. Sleeps on 3 pillows and cannot lay flat. No PND since back on medications. Has been drinking a lot of fluids because his family thinks it will help his kidneys. Drinks at least four 16 oz water bottles a day. Notes he tries to limit the amount of salt he adds to his food, but sometimes adds sea salt. His son also brings him chicken noodle soup from [employer] once a week at least. Main concern today is that he can't drive for 6 months. This makes it difficult to get his medications from the pharmacy, and he has been out of spironolactone and  Jardiance since Sunday. Set up medications for delivery with [facility] today with help from clinic patient advocate. Not being able to drive also prevents him from going to the grocery store and has contributed to food insecurity. Patient made selections from [facility] today, appreciate social work's assistance. SBP at home 95-100 mmHg. BP in clinic today 100/68.    HF Medications: Spironolactone 25 mg  daily Jardiance 10 mg daily Digoxin 0.0625 mg daily Lasix 80 mg PRN  Has the patient been experiencing any side effects to the medications prescribed?  no  Does the patient have any problems obtaining medications due to transportation or finances?   Yes; [employer] Medicare with [place]. Medications sent to [facility] today to ship to patient as he cannot drive.   Understanding of regimen: fair Understanding of indications: fair Potential of compliance: good Patient understands to avoid NSAIDs. Patient understands to avoid decongestants.    Pertinent Lab Values: 06/24/22: Serum creatinine 1.39, BUN 21, Potassium 4.2, Sodium 132, BNP 175.9  Vital Signs: Weight: 139.2 lbs (last clinic weight: 142 lbs) Blood pressure: 100/68  Heart rate: 69   Assessment/Plan: 1. VT  - Shocks from [employer] ICD (06/08/22), 2/2 hypomag and hypokalemia, ETOH abuse and medication noncompliance. - No prior chest pain, prior coronary workups have shown no significant coronary disease.  - Continue amiodarone - No further VT on device interrogation 06/24/22 - No driving x 6 months   2. Chronic systolic CHF - Mostly NICM (? H/o PCI to unknown vessel in 2011.) CTA 2018 normal cors - s/p [employer] ICD - Echo (03/2018): EF 25-30% - CPX 09/2018 moderate to severe HF limitation. pVO2: 14.9 (52% predicted peak VO2).  slope: 31  pRER: 1.05  - Echo (03/10/19): EF 25-30% RV ok Mild MR - Myoview (05/2020): EF 34% fixed inferior defect - Echo (06/2022): EF < 20%, RV mildly reduced (in setting of VT), moderate-severe TR, moderate MR.  - RHC (06/2022): RA 10, PCWP 11, CO/CI (Thermo) 2.9/1.78. >>started on DBA - CMP likely 2/2 ETOH. - Stable NYHA II. Volume looks good today. - Continue Lasix 80 mg PRN - Restart spironolactone 25 mg daily. - Restart Jardiance 10 mg daily. - Continue digoxin 0.0625 mg daily. - BP too soft today to add losartan.  - No ARNi/ACE (angioedema with Entresto and lisinopril). -  Not candidate for advanced therapies at this time with heavy ETOH abuse.  - Repeat echo after back on GDMT - Educated on fluid restriction    3. ETOH abuse - Heavy ETOH use, likely contributes to cardiomyopathy.  - Stopped since discharge. Congratulated - Encouraged him to have a plan to stay sober   4. CKD 3  - Baseline SCr 1.3-1.6 - Continue SGLT2i   5. HTN - BP controlled. - GDMT as above   6. CAD - h/o single vessel PCI by reo - Myoview (05/2020): EF 34% fixed inferior defect - No chest pain - Continue ASA - Continue statin   7. OSA - Unable to tolerate CPAP - Discuss referral to sleep medicine next visit   8. SDOH - HFSW helping with transportation & resources - He has [employer] Medicare  Follow up 3 weeks with APP Clinic    Karle Plumber, PharmD, BCPS, BCCP, CPP Heart Failure Clinic Pharmacist [phone]

## 2022-06-29 NOTE — Progress Notes (Signed)
Assessment & Plan:  Stanley [name] was seen today for hospitalization follow-up.  Diagnoses and all orders for this visit:  Hospital discharge follow-up Labs 2-3 weeks. Follow up with cardiology as instructed.   Patient has been counseled on age-appropriate routine health concerns for screening and prevention. These are reviewed and up-to-date. Referrals have been placed accordingly. Immunizations are up-to-date or declined.    Subjective:   Chief Complaint  Patient presents with   Hospitalization Follow-up   HPI Stanley [name] 57 y.o. male presents to office today for hospital follow up.  He has a past medical history of Acute renal failure, Alcohol abuse, Anxiety, CAD, Chronic systolic CHF, Depression, Financial difficulties, Gout, History of noncompliance with medical treatment, HTN,  ICD, Insomnia, Lobar pneumonia (12/28/2018), MI (2005), Sleep apnea, and Ventricular tachycardia (01/2015).    He is followed by Cardiology for NICM  HFU Admitted 6-3 through 6-11 Stanley [name] presented on 6/3 after his ICD discharge multiple times at home.  EP determined this was due to VT in setting of medical noncompliance, electrolyte issues, and alcohol intoxication.  Patient was admitted,started on amiodarone. On 6/4, he choked on a banana and desaturated.  He was thus placed on BiPAP, chest x-ray showed pulmonary edema and diuresis was started.  Blood pressure dropped overnight and started on pressors, transferred to the ICU>On 6/6, he had a right heart catheterization. Over the next few days, he was weaned from Levophed and transferred to the floor on 6/8 still on dobutamine that was off 6/9. Being followed by advanced heart failure team and EP cardiology. PICC line was removed 6/10 and he was transitioned to oral amiodarone.   Doing well today. Agreeable to referral for psychotherapy. Requesting librium. Unfortunately I do not prescribe this medication. I did recommended OP centers for this.    Blood pressure is well controlled. He has all medications with him today except for digoxin which I have instructed him to pick up from the pharmacy.  BP Readings from Last 3 Encounters:  06/29/22 108/68  06/24/22 110/70  06/16/22 114/73    Review of Systems  Constitutional:  Negative for fever, malaise/fatigue and weight loss.  HENT: Negative.  Negative for nosebleeds.   Eyes: Negative.  Negative for blurred vision, double vision and photophobia.  Respiratory: Negative.  Negative for cough and shortness of breath.   Cardiovascular: Negative.  Negative for chest pain, palpitations and leg swelling.  Gastrointestinal: Negative.  Negative for heartburn, nausea and vomiting.  Musculoskeletal: Negative.  Negative for myalgias.  Neurological: Negative.  Negative for dizziness, focal weakness, seizures and headaches.  Psychiatric/Behavioral:  Positive for depression and substance abuse. Negative for suicidal ideas. The patient is nervous/anxious and has insomnia.     Past Medical History:  Diagnosis Date   Acute renal failure (ARF) (HCC)    Acute respiratory failure with hypoxia (HCC) 12/29/2018   Alcohol abuse    Anxiety    CAD (coronary artery disease)    a. dx unclear, reported PCI in 2011 but cath 2014 normal coronaries and cardiac CT 07/2016 normal coronaries, no mention of stents.   Chronic chest pain    Chronic systolic CHF (congestive heart failure) (HCC)    Depression    Dyspnea    07/05/2019: per patient some times with exertion, "has to catch breath"   Family history of breast cancer 05/30/2021   Family history of pancreatic cancer 05/30/2021   Financial difficulties    Gout    Gouty arthritis    Headache  History of noncompliance with medical treatment    financial challenges   Hypertension    ICD (implantable cardioverter-defibrillator) in place    [place] [employer] ICD implant done in [place] 2015   Insomnia    Lobar pneumonia (HCC) 12/28/2018   Myocardial infarction (HCC)  2005   Nonischemic cardiomyopathy (HCC)    Sleep apnea    per patient, has CPAP does not wear it every night   Ventricular tachycardia (HCC) 01/2015   2017 - ATP delivered for sinus tach, degenerated into VT for which he received shock. Recurrent VT 03/2018.    Past Surgical History:  Procedure Laterality Date   BAROREFLEX SYSTEM INSERTION     BIOPSY  03/06/2021   Procedure: BIOPSY;  Surgeon: Benancio Deeds, MD;  Location: [facility];  Service: Gastroenterology;;   CARDIAC DEFIBRILLATOR PLACEMENT  06/05/2013   [employer] Inogen ICD implanted at [facility] in [place] for primary prevention   COLONOSCOPY WITH PROPOFOL N/A 03/06/2021   Procedure: COLONOSCOPY WITH PROPOFOL;  Surgeon: Benancio Deeds, MD;  Location: [facility];  Service: Gastroenterology;  Laterality: N/A;   ESOPHAGEAL DILATION  03/06/2021   Procedure: ESOPHAGEAL DILATION;  Surgeon: Benancio Deeds, MD;  Location: [facility];  Service: Gastroenterology;;   ESOPHAGOGASTRODUODENOSCOPY (EGD) WITH PROPOFOL N/A 03/06/2021   Procedure: ESOPHAGOGASTRODUODENOSCOPY (EGD) WITH PROPOFOL;  Surgeon: Benancio Deeds, MD;  Location: [facility];  Service: Gastroenterology;  Laterality: N/A;   ESOPHAGOGASTRODUODENOSCOPY (EGD) WITH PROPOFOL N/A 05/18/2021   Procedure: ESOPHAGOGASTRODUODENOSCOPY (EGD) WITH PROPOFOL;  Surgeon: Sherrilyn Rist, MD;  Location[facility] ENDOSCOPY;  Service: Gastroenterology;  Laterality: N/A;   HERNIA REPAIR     PERCUTANEOUS CORONARY STENT INTERVENTION (PCI-S)  01/05/2009   POLYPECTOMY  03/06/2021   Procedure: POLYPECTOMY;  Surgeon: Benancio Deeds, MD;  Location: [facility];  Service: Gastroenterology;;   RIGHT HEART CATH N/A 06/11/2022   Procedure: RIGHT HEART CATH;  Surgeon: Laurey Morale, MD;  Location: [facility] INVASIVE CV LAB;  Service: Cardiovascular;  Laterality: N/A;    Family History  Problem Relation Age of Onset   Breast cancer Mother    Pancreatic cancer Mother     Hypertension Father    Alzheimer's disease Father    Gout Father    Dementia Father    Hypertension Sister    Clotting disorder Sister    Obesity Brother    Bronchitis Brother    Cancer Maternal Uncle        unknown type; dx after 31; x2 maternal uncles   Breast cancer Paternal Aunt        dx after 19   Lung cancer Paternal Uncle    Heart disease Maternal Grandmother    Gout Paternal Grandfather    Colon polyps Neg Hx    Colon cancer Neg Hx    Esophageal cancer Neg Hx    Stomach cancer Neg Hx     Social History Reviewed with no changes to be made today.   Outpatient Medications Prior to Visit  Medication Sig Dispense Refill   amiodarone (PACERONE) 200 MG tablet Take 2 tablets (400 mg total) by mouth 2 (two) times daily for 7 days, THEN 1 tablet (200 mg total) 2 (two) times daily for 7 days, THEN 1 tablet (200 mg total) daily thereafter. 74 tablet 6   atorvastatin (LIPITOR) 20 MG tablet Take 1 tablet (20 mg total) by mouth daily. 90 tablet 3   digoxin (LANOXIN) 0.125 MG tablet Take 0.5 tablets (0.0625 mg total) by  mouth daily. 15 tablet 5   empagliflozin (JARDIANCE) 10 MG TABS tablet Take 1 tablet (10 mg total) by mouth daily. 30 tablet 5   furosemide (LASIX) 80 MG tablet MAY TAKE 1 TABLET AS NEEDED FOR SWELLING 45 tablet 5   nitroGLYCERIN (NITROSTAT) 0.4 MG SL tablet Place 1 tablet (0.4 mg total) under the tongue every 5 (five) minutes for 3 doses as needed for chest pain. 25 tablet 6   spironolactone (ALDACTONE) 25 MG tablet Take 1 tablet (25 mg total) by mouth daily. 30 tablet 5   traZODone (DESYREL) 150 MG tablet Take 1 tablet (150 mg total) by mouth at bedtime. 90 tablet 1   No facility-administered medications prior to visit.    Allergies  Allergen Reactions   Lisinopril Shortness Of Breath    Makes lips swell   Allopurinol Nausea Only    dizziness   Entresto [Sacubitril-Valsartan]     History of angioedema   Other Swelling    Nuts - Lip Swelling   Shellfish  Allergy Swelling    Lip Swelling       Objective:    BP 108/68 (BP Location: Left Arm, Patient Position: Sitting, Cuff Size: Normal)   Pulse 71   Ht 5\' 4"  (1.626 m)   Wt 139 lb 9.6 oz (63.3 kg)   SpO2 98%   BMI 23.96 kg/m  Wt Readings from Last 3 Encounters:  06/29/22 139 lb 9.6 oz (63.3 kg)  06/24/22 142 lb (64.4 kg)  06/16/22 136 lb 4.8 oz (61.8 kg)    Physical Exam Vitals and nursing note reviewed.  Constitutional:      Appearance: He is well-developed.  HENT:     Head: Normocephalic and atraumatic.  Cardiovascular:     Rate and Rhythm: Normal rate and regular rhythm.     Heart sounds: Normal heart sounds. No murmur heard.    No friction rub. No gallop.  Pulmonary:     Effort: Pulmonary effort is normal. No tachypnea or respiratory distress.     Breath sounds: Normal breath sounds. No decreased breath sounds, wheezing, rhonchi or rales.  Chest:     Chest wall: No tenderness.  Abdominal:     General: Bowel sounds are normal.     Palpations: Abdomen is soft.  Musculoskeletal:        General: Normal range of motion.     Cervical back: Normal range of motion.  Skin:    General: Skin is warm and dry.  Neurological:     Mental Status: He is alert and oriented to person, place, and time.     Coordination: Coordination normal.  Psychiatric:        Behavior: Behavior normal. Behavior is cooperative.        Thought Content: Thought content normal.        Judgment: Judgment normal.          Patient has been counseled extensively about nutrition and exercise as well as the importance of adherence with medications and regular follow-up. The patient was given clear instructions to go to ER or return to medical center if symptoms don't improve, worsen or new problems develop. The patient verbalized understanding.   Follow-up: Return for 3 weeks labs. See me in 3 months.   [name], FNP-BC [facility] [place],  [place] [phone]   06/29/2022, 12:08 PM

## 2022-06-30 ENCOUNTER — Telehealth (HOSPITAL_COMMUNITY): Payer: Self-pay | Admitting: Licensed Clinical Social Worker

## 2022-06-30 NOTE — Telephone Encounter (Unsigned)
H&V Care Navigation CSW Progress Note  Clinical Social Worker assisted in paying past due [employer] bill- pt informed   SDOH Screenings   Food Insecurity: Food Insecurity Present (06/24/2022)  Housing: Patient Declined (06/24/2022)  Transportation Needs: Unmet Transportation Needs (06/24/2022)  Utilities: Not At Risk (06/11/2022)  Alcohol Screen: Low Risk  (02/07/2019)  Depression (PHQ2-9): Medium Risk (06/29/2022)  Financial Resource Strain: High Risk (06/30/2022)  Physical Activity: Unknown (03/21/2018)  Social Connections: Unknown (03/21/2018)  Stress: Stress Concern Present (03/21/2018)  Tobacco Use: Low Risk  (06/29/2022)     Burna Sis, LCSW Clinical Social Worker Advanced Heart Failure Clinic Desk#: [phone] Cell#: [phone]

## 2022-07-02 ENCOUNTER — Telehealth: Payer: Self-pay | Admitting: Hematology and Oncology

## 2022-07-02 NOTE — Progress Notes (Deleted)
Cardiology Office Note:   Date:  07/02/2022  ID:  Stanley [name], DOB 03-Aug-1965, MRN [id number]  History of Present Illness:   Stanley [name] is a 57 y.o. male seen today for post hospital follow up.    Admitted 6/3 - 6/11 for ICD shock in setting of ETOH abuse and med-noncompliance.  Since discharge from hospital the patient reports doing ***.  he denies chest pain, palpitations, dyspnea, PND, orthopnea, nausea, vomiting, dizziness, syncope, edema, weight gain, or early satiety.   Review of systems complete and found to be negative unless listed in HPI.    Studies Reviewed:    EKG is not ordered today. EKG from *** reviewed which showed ***  ***  DEVICE HISTORY:  Barostim (standard) implanted 07/07/2019 for Chronic systolic CHF [employer] single chamber ICD 06/05/2013 for chronic systolic CHF  Barostim Interrogation- Performed personally and reviewed in detail today,  See scanned report  Risk Assessment/Calculations:   {Does this patient have ATRIAL FIBRILLATION?:[id number]} No BP recorded.  {Refresh Note OR Click here to enter BP  :1}***        Physical Exam:   VS:  There were no vitals taken for this visit.   Wt Readings from Last 3 Encounters:  06/29/22 139 lb 9.6 oz (63.3 kg)  06/24/22 142 lb (64.4 kg)  06/16/22 136 lb 4.8 oz (61.8 kg)     GEN: Well nourished, well developed in no acute distress NECK: No JVD; No carotid bruits CARDIAC: {EPRHYTHM:28826}, no murmurs, rubs, gallops RESPIRATORY:  Clear to auscultation without rales, wheezing or rhonchi  ABDOMEN: Soft, non-tender, non-distended EXTREMITIES:  No edema; No deformity   ASSESSMENT AND PLAN:    1. Chronic systolic CHF s/p {Blank single:19197::"Medtronic","St. Jude","[employer] Scientific","Biotronik"} and Barostim implantation NYHA *** symptoms. ***  Device titrated from *** millamp to *** milliamp by increments of 0.4 with good blood pressure response and no stimulation.  Device impedence  stable. Pt goals are to ***  Normal device function See scanned report. Will follow up in *** weeks to continue titration with goal of 6-8 milliamps for chronic settings.   VT with ICD shock In setting of med non-compliance, ETOH abuse, and hypokalemia.  ? Worsening CHF as well.  Reload IV amiodarone.    Chronic systolic CHF s/p Barostim (standard) and [employer] single chamber ICD NYHA *** chronically Non-compliant with coreg, losartan, Bidil, and spiro.  Not Entresto candidate (h/o angioedema)   GDMT as tolerated, HF clinic following.  Does not appear markedly volume overload, and hypertensive on presentation; so suspect OK to resume BB.  Will use toprol instead of coreg to hopefully augment compliance.    Hypokalemia BMET  today ***   Atypical chest pain Chronic, intermittent and stable. No exertional type pains.  Myoview 05/2020 with fixed chronic, persistent defect. No ischemia.  Felt similar prior to ICD firing.    ETOH abuse *** Drinks most days, most of the day.  States he had 5 shots last night before bed.  Blood level 100 mg/dl this am (consistent with 0.10 BAC)   Depression Med Non-compliance States he was depressed and tired of taking his medications.  He denies any interest in hurting himself or any plans to do so.      {Are you ordering a CV Procedure (e.g. stress test, cath, DCCV, TEE, etc)?   Press F2        :[id number]}   Disposition:   Follow up with {EPPROVIDERS:28135} in {EPFOLLOW UP:28173}  Signed, Graciella Freer, PA-C

## 2022-07-02 NOTE — Telephone Encounter (Signed)
Left patient a vm regarding upcoming appointment change  

## 2022-07-03 ENCOUNTER — Inpatient Hospital Stay: Payer: Self-pay | Admitting: Hematology and Oncology

## 2022-07-03 ENCOUNTER — Ambulatory Visit: Payer: Self-pay | Attending: Student | Admitting: Student

## 2022-07-03 ENCOUNTER — Inpatient Hospital Stay: Payer: Self-pay | Attending: Hematology and Oncology | Admitting: Hematology and Oncology

## 2022-07-03 ENCOUNTER — Encounter: Payer: Self-pay | Admitting: Student

## 2022-07-03 DIAGNOSIS — F1010 Alcohol abuse, uncomplicated: Secondary | ICD-10-CM

## 2022-07-03 DIAGNOSIS — I5022 Chronic systolic (congestive) heart failure: Secondary | ICD-10-CM

## 2022-07-03 DIAGNOSIS — I472 Ventricular tachycardia, unspecified: Secondary | ICD-10-CM

## 2022-07-03 DIAGNOSIS — I2510 Atherosclerotic heart disease of native coronary artery without angina pectoris: Secondary | ICD-10-CM

## 2022-07-13 ENCOUNTER — Telehealth (HOSPITAL_COMMUNITY): Payer: Self-pay | Admitting: Licensed Clinical Social Worker

## 2022-07-13 NOTE — Telephone Encounter (Signed)
H&V Care Navigation CSW Progress Note  Clinical Social Worker received message that pt in need of transport to appt tomorrow.  CSW able to arrange taxi ride through [place] [employer]   SDOH Screenings   Food Insecurity: Food Insecurity Present (06/24/2022)  Housing: Patient Declined (06/24/2022)  Transportation Needs: Unmet Transportation Needs (07/13/2022)  Utilities: Not At Risk (06/11/2022)  Alcohol Screen: Low Risk  (02/07/2019)  Depression (PHQ2-9): Medium Risk (06/29/2022)  Financial Resource Strain: High Risk (06/30/2022)  Physical Activity: Unknown (03/21/2018)  Social Connections: Unknown (03/21/2018)  Stress: Stress Concern Present (03/21/2018)  Tobacco Use: Low Risk  (06/29/2022)   07/13/2022  Darlina Rumpf DOB: January 09, 1965 MRN: [id number]   RIDER WAIVER AND RELEASE OF LIABILITY  For the purposes of helping with transportation needs, [facility] partners with outside transportation providers (taxi companies, [place], Catering manager.) to give [employer] patients or other approved people the choice of on-demand rides [employer]") to our buildings for non-emergency visits.  By using [employer], I, the person signing this document, on behalf of myself and/or any legal minors (in my care using the [employer]), agree:  Science writer given to me are supplied by independent, outside transportation providers who do not work for, or have any affiliation with, [employer]. [facility] is not a transportation company. [facility] has no control over the quality or safety of the rides I get using [employer]. [facility] has no control over whether any outside ride will happen on time or not. [facility] gives no guarantee on the reliability, quality, safety, or availability on any rides, or that no mistakes will happen. I know and accept that traveling by vehicle (car, truck, SVU, Zenaida Niece, bus, taxi, etc.) has risks of serious injuries such as disability, being paralyzed, and  death. I know and agree the risk of using [employer] is mine alone, and not [employer]. Transport Services are provided "as is" and as are available. The transportation providers are in charge for all inspections and care of the vehicles used to provide these rides. I agree not to take legal action against [facility], its agents, employees, officers, directors, representatives, insurers, attorneys, assigns, successors, subsidiaries, and affiliates at any time for any reasons related directly or indirectly to using [employer]. I also agree not to take legal action against [facility] or its affiliates for any injury, death, or damage to property caused by or related to using [employer]. I have read this Waiver and Release of Liability, and I understand the terms used in it and their legal meaning. This Waiver is freely and voluntarily given with the understanding that my right (or any legal minors) to legal action against [place] relating to [employer] is knowingly given up to use these services.   I attest that I read the Ride Waiver and Release of Liability to Darlina Rumpf, gave Mr. Newgard the opportunity to ask questions and answered the questions asked (if any). I affirm that Daries Lardieri then provided consent for assistance with transportation.     Burna Sis

## 2022-07-14 ENCOUNTER — Other Ambulatory Visit (HOSPITAL_COMMUNITY): Payer: Self-pay

## 2022-07-14 ENCOUNTER — Ambulatory Visit (HOSPITAL_COMMUNITY)
Admission: RE | Admit: 2022-07-14 | Discharge: 2022-07-14 | Disposition: A | Discharge status: Home / Self Care | Payer: Self-pay | Source: Ambulatory Visit | Attending: Internal Medicine | Admitting: Internal Medicine

## 2022-07-14 ENCOUNTER — Other Ambulatory Visit: Payer: Self-pay

## 2022-07-14 DIAGNOSIS — N183 Chronic kidney disease, stage 3 unspecified: Secondary | ICD-10-CM | POA: Insufficient documentation

## 2022-07-14 DIAGNOSIS — I700 Atherosclerosis of aorta: Secondary | ICD-10-CM

## 2022-07-14 DIAGNOSIS — I472 Ventricular tachycardia, unspecified: Secondary | ICD-10-CM | POA: Insufficient documentation

## 2022-07-14 DIAGNOSIS — Z91199 Patient's noncompliance with other medical treatment and regimen due to unspecified reason: Secondary | ICD-10-CM | POA: Insufficient documentation

## 2022-07-14 DIAGNOSIS — I1310 Hypertensive heart and chronic kidney disease without heart failure, with stage 1 through stage 4 chronic kidney disease, or unspecified chronic kidney disease: Secondary | ICD-10-CM | POA: Insufficient documentation

## 2022-07-14 DIAGNOSIS — Z95810 Presence of automatic (implantable) cardiac defibrillator: Secondary | ICD-10-CM | POA: Insufficient documentation

## 2022-07-14 DIAGNOSIS — I428 Other cardiomyopathies: Secondary | ICD-10-CM | POA: Insufficient documentation

## 2022-07-14 DIAGNOSIS — G4733 Obstructive sleep apnea (adult) (pediatric): Secondary | ICD-10-CM | POA: Insufficient documentation

## 2022-07-14 DIAGNOSIS — I2510 Atherosclerotic heart disease of native coronary artery without angina pectoris: Secondary | ICD-10-CM | POA: Insufficient documentation

## 2022-07-14 DIAGNOSIS — I5022 Chronic systolic (congestive) heart failure: Secondary | ICD-10-CM | POA: Insufficient documentation

## 2022-07-14 DIAGNOSIS — E1122 Type 2 diabetes mellitus with diabetic chronic kidney disease: Secondary | ICD-10-CM | POA: Insufficient documentation

## 2022-07-14 MED ORDER — EMPAGLIFLOZIN 10 MG PO TABS
10.0000 mg | ORAL_TABLET | Freq: Every day | ORAL | 1 refills | Status: DC
  Filled 2022-07-14 (×2): qty 90, 90d supply, fill #0

## 2022-07-14 MED ORDER — POTASSIUM CHLORIDE CRYS ER 20 MEQ PO TBCR
20.0000 meq | EXTENDED_RELEASE_TABLET | Freq: Every day | ORAL | 1 refills | Status: DC
  Filled 2022-07-14: qty 90, 90d supply, fill #0

## 2022-07-14 MED ORDER — ATORVASTATIN CALCIUM 20 MG PO TABS
20.0000 mg | ORAL_TABLET | Freq: Every day | ORAL | 1 refills | Status: DC
Start: 2022-07-14 — End: 2022-07-20
  Filled 2022-07-14: qty 90, 90d supply, fill #0

## 2022-07-14 MED ORDER — DIGOXIN 125 MCG PO TABS
0.0625 mg | ORAL_TABLET | Freq: Every day | ORAL | 1 refills | Status: DC
  Filled 2022-07-14: qty 45, 90d supply, fill #0

## 2022-07-14 MED ORDER — NITROGLYCERIN 0.4 MG SL SUBL
0.4000 mg | SUBLINGUAL_TABLET | SUBLINGUAL | 6 refills | Status: DC | PRN
Start: 2022-07-14 — End: 2022-09-29
  Filled 2022-07-14: qty 25, 1d supply, fill #0

## 2022-07-14 MED ORDER — AMIODARONE HCL 200 MG PO TABS
200.0000 mg | ORAL_TABLET | Freq: Every day | ORAL | 1 refills | Status: DC
  Filled 2022-07-14: qty 90, 90d supply, fill #0

## 2022-07-14 MED ORDER — SPIRONOLACTONE 25 MG PO TABS
25.0000 mg | ORAL_TABLET | Freq: Every day | ORAL | 1 refills | Status: DC
  Filled 2022-07-14 (×2): qty 90, 90d supply, fill #0

## 2022-07-14 MED ORDER — FUROSEMIDE 80 MG PO TABS
ORAL_TABLET | ORAL | 5 refills | Status: DC
  Filled 2022-07-14: qty 90, fill #0

## 2022-07-14 NOTE — Progress Notes (Signed)
H&V Care Navigation CSW Progress Note  Clinical Social Worker assisted with H&V Food Bag.  Patient is participating in a Managed Medicaid Plan:  No  Patient met with Pharmacist and noted some food insecurity during the visit. CSW assisted with a food bag to bridge the gap for patient. Lasandra Beech, LCSW, CCSW-MCS [phone]   SDOH Screenings   Food Insecurity: Food Insecurity Present (07/14/2022)  Housing: Patient Declined (06/24/2022)  Transportation Needs: Unmet Transportation Needs (07/13/2022)  Utilities: Not At Risk (06/11/2022)  Alcohol Screen: Low Risk  (02/07/2019)  Depression (PHQ2-9): Medium Risk (06/29/2022)  Financial Resource Strain: High Risk (06/30/2022)  Physical Activity: Unknown (03/21/2018)  Social Connections: Unknown (03/21/2018)  Stress: Stress Concern Present (03/21/2018)  Tobacco Use: Low Risk  (06/29/2022)

## 2022-07-14 NOTE — Patient Instructions (Signed)
It was a pleasure seeing you today!  MEDICATIONS: -No medication changes today -Restart Jardiance and spironolactone -Call if you have questions about your medications.  NEXT APPOINTMENT: Return to clinic in 3 weeks with [facility].  In general, to take care of your heart failure: -Limit your fluid intake to 2 Liters (half-gallon) per day.   -Limit your salt intake to ideally 2-3 grams (2000-3000 mg) per day. -Weigh yourself daily and record, and bring that "weight diary" to your next appointment.  (Weight gain of 2-3 pounds in 1 day typically means fluid weight.) -The medications for your heart are to help your heart and help you live longer.   -Please contact us before stopping any of your heart medications.  Call the clinic at [phone] with questions or to reschedule future appointments.

## 2022-07-14 NOTE — Addendum Note (Signed)
Encounter addended by: Marcy Siren, LCSW on: 07/14/2022 2:22 PM  Actions taken: Flowsheet accepted, Clinical Note Signed

## 2022-07-20 ENCOUNTER — Other Ambulatory Visit: Payer: Self-pay | Admitting: Nurse Practitioner

## 2022-07-20 ENCOUNTER — Ambulatory Visit: Payer: Self-pay | Attending: Family Medicine

## 2022-07-20 DIAGNOSIS — I700 Atherosclerosis of aorta: Secondary | ICD-10-CM

## 2022-07-20 DIAGNOSIS — I10 Essential (primary) hypertension: Secondary | ICD-10-CM

## 2022-07-20 DIAGNOSIS — Z862 Personal history of diseases of the blood and blood-forming organs and certain disorders involving the immune mechanism: Secondary | ICD-10-CM

## 2022-07-20 MED ORDER — ATORVASTATIN CALCIUM 20 MG PO TABS: 20.0000 mg | ORAL_TABLET | Freq: Every day | ORAL | 1 refills | Status: DC

## 2022-07-20 MED ORDER — ALLOPURINOL 300 MG PO TABS: 300.0000 mg | ORAL_TABLET | Freq: Every day | ORAL | 6 refills | Status: DC

## 2022-07-21 ENCOUNTER — Other Ambulatory Visit: Payer: Self-pay | Admitting: Nurse Practitioner

## 2022-07-21 DIAGNOSIS — N1831 Chronic kidney disease, stage 3a: Secondary | ICD-10-CM

## 2022-07-21 LAB — CBC WITH DIFFERENTIAL/PLATELET
Basophils Absolute: 0.1 10*3/uL (ref 0.0–0.2)
Basos: 1 %
EOS (ABSOLUTE): 0.1 10*3/uL (ref 0.0–0.4)
Eos: 1 %
Hematocrit: 38.1 % (ref 37.5–51.0)
Hemoglobin: 12.8 g/dL — ABNORMAL LOW (ref 13.0–17.7)
Immature Grans (Abs): 0.1 10*3/uL (ref 0.0–0.1)
Immature Granulocytes: 1 %
Lymphocytes Absolute: 1.5 10*3/uL (ref 0.7–3.1)
Lymphs: 12 %
MCH: 32.7 pg (ref 26.6–33.0)
MCHC: 33.6 g/dL (ref 31.5–35.7)
MCV: 97 fL (ref 79–97)
Monocytes Absolute: 0.8 10*3/uL (ref 0.1–0.9)
Monocytes: 6 %
Neutrophils Absolute: 9.7 10*3/uL — ABNORMAL HIGH (ref 1.4–7.0)
Neutrophils: 79 %
Platelets: 266 10*3/uL (ref 150–450)
RBC: 3.92 x10E6/uL — ABNORMAL LOW (ref 4.14–5.80)
RDW: 11.1 % — ABNORMAL LOW (ref 11.6–15.4)
WBC: 12.2 10*3/uL — ABNORMAL HIGH (ref 3.4–10.8)

## 2022-07-21 LAB — CMP14+EGFR
ALT: 24 IU/L (ref 0–44)
AST: 23 IU/L (ref 0–40)
Albumin: 4 g/dL (ref 3.8–4.9)
Alkaline Phosphatase: 60 IU/L (ref 44–121)
BUN/Creatinine Ratio: 20 (ref 9–20)
BUN: 32 mg/dL — ABNORMAL HIGH (ref 6–24)
Bilirubin Total: 0.4 mg/dL (ref 0.0–1.2)
CO2: 17 mmol/L — ABNORMAL LOW (ref 20–29)
Calcium: 9.4 mg/dL (ref 8.7–10.2)
Chloride: 106 mmol/L (ref 96–106)
Creatinine, Ser: 1.58 mg/dL — ABNORMAL HIGH (ref 0.76–1.27)
Globulin, Total: 3 g/dL (ref 1.5–4.5)
Glucose: 86 mg/dL (ref 70–99)
Potassium: 4.6 mmol/L (ref 3.5–5.2)
Sodium: 137 mmol/L (ref 134–144)
Total Protein: 7 g/dL (ref 6.0–8.5)
eGFR: 50 mL/min/{1.73_m2} — ABNORMAL LOW (ref >59)

## 2022-07-30 ENCOUNTER — Other Ambulatory Visit: Payer: Self-pay

## 2022-07-30 DIAGNOSIS — Z803 Family history of malignant neoplasm of breast: Secondary | ICD-10-CM

## 2022-07-30 DIAGNOSIS — Z800 Family history of malignant neoplasm of digestive organs: Secondary | ICD-10-CM

## 2022-07-31 ENCOUNTER — Inpatient Hospital Stay: Payer: Self-pay

## 2022-07-31 ENCOUNTER — Inpatient Hospital Stay: Payer: Self-pay | Attending: Hematology and Oncology | Admitting: Adult Health

## 2022-07-31 ENCOUNTER — Other Ambulatory Visit: Payer: Self-pay

## 2022-07-31 ENCOUNTER — Encounter: Payer: Self-pay | Admitting: Adult Health

## 2022-07-31 VITALS — BP 115/70 | HR 64 | Temp 97.2°F | Resp 18 | Ht 64.0 in | Wt 148.2 lb

## 2022-07-31 DIAGNOSIS — Z800 Family history of malignant neoplasm of digestive organs: Secondary | ICD-10-CM

## 2022-07-31 DIAGNOSIS — D72829 Elevated white blood cell count, unspecified: Secondary | ICD-10-CM | POA: Insufficient documentation

## 2022-07-31 DIAGNOSIS — D729 Disorder of white blood cells, unspecified: Secondary | ICD-10-CM

## 2022-07-31 DIAGNOSIS — Z809 Family history of malignant neoplasm, unspecified: Secondary | ICD-10-CM | POA: Insufficient documentation

## 2022-07-31 DIAGNOSIS — Z801 Family history of malignant neoplasm of trachea, bronchus and lung: Secondary | ICD-10-CM | POA: Insufficient documentation

## 2022-07-31 DIAGNOSIS — Z803 Family history of malignant neoplasm of breast: Secondary | ICD-10-CM | POA: Insufficient documentation

## 2022-07-31 DIAGNOSIS — F1090 Alcohol use, unspecified, uncomplicated: Secondary | ICD-10-CM | POA: Insufficient documentation

## 2022-07-31 LAB — CBC WITH DIFFERENTIAL (CANCER CENTER ONLY)
Abs Immature Granulocytes: 0.08 10*3/uL — ABNORMAL HIGH (ref 0.00–0.07)
Basophils Absolute: 0.1 10*3/uL (ref 0.0–0.1)
Basophils Relative: 0 %
Eosinophils Absolute: 0.1 10*3/uL (ref 0.0–0.5)
Eosinophils Relative: 1 %
HCT: 39.9 % (ref 39.0–52.0)
Hemoglobin: 13 g/dL (ref 13.0–17.0)
Immature Granulocytes: 1 %
Lymphocytes Relative: 17 %
Lymphs Abs: 2.3 10*3/uL (ref 0.7–4.0)
MCH: 31.9 pg (ref 26.0–34.0)
MCHC: 32.6 g/dL (ref 30.0–36.0)
MCV: 97.8 fL (ref 80.0–100.0)
Monocytes Absolute: 0.9 10*3/uL (ref 0.1–1.0)
Monocytes Relative: 7 %
Neutro Abs: 9.9 10*3/uL — ABNORMAL HIGH (ref 1.7–7.7)
Neutrophils Relative %: 74 %
Platelet Count: 258 10*3/uL (ref 150–400)
RBC: 4.08 MIL/uL — ABNORMAL LOW (ref 4.22–5.81)
RDW: 11.9 % (ref 11.5–15.5)
WBC Count: 13.3 10*3/uL — ABNORMAL HIGH (ref 4.0–10.5)
nRBC: 0 % (ref 0.0–0.2)

## 2022-07-31 LAB — CMP (CANCER CENTER ONLY)
ALT: 19 U/L (ref 0–44)
AST: 17 U/L (ref 15–41)
Albumin: 3.9 g/dL (ref 3.5–5.0)
Alkaline Phosphatase: 50 U/L (ref 38–126)
Anion gap: 6 (ref 5–15)
BUN: 23 mg/dL — ABNORMAL HIGH (ref 6–20)
CO2: 22 mmol/L (ref 22–32)
Calcium: 9.6 mg/dL (ref 8.9–10.3)
Chloride: 111 mmol/L (ref 98–111)
Creatinine: 1.4 mg/dL — ABNORMAL HIGH (ref 0.61–1.24)
GFR, Estimated: 58 mL/min — ABNORMAL LOW (ref >60)
Glucose, Bld: 82 mg/dL (ref 70–99)
Potassium: 4 mmol/L (ref 3.5–5.1)
Sodium: 139 mmol/L (ref 135–145)
Total Bilirubin: 0.6 mg/dL (ref 0.3–1.2)
Total Protein: 7.3 g/dL (ref 6.5–8.1)

## 2022-07-31 NOTE — Assessment & Plan Note (Addendum)
Stanley [name] is a 57 year old man here today for follow-up of his leukocytosis.  His white blood cell is slightly elevated however given the fact that he recently had a heart attack it is likely due to this.  I reviewed that with him in detail and he verbalized understanding.  We will keep an eye on it and recheck it in 3 months as he is concerned.  I reviewed with Nahuel that alcohol in large amounts can be difficult for the bone marrow and can lead to anemia.  I am happy that he is quit and his hemoglobin has since normalized.  His mom had pancreatic cancer he has no new family history of pancreatic cancer.  He will let us know if that changes.  His genetic testing was negative.  Kinley will return as indicated above.  I congratulated him on working on healthier habits.  RTC in 3 months for labs and f/u.

## 2022-07-31 NOTE — Progress Notes (Signed)
[facility] Cancer Follow up:    Stanley Rigg, NP [address] [address] [place] [address]   DIAGNOSIS: leukocytosis  SUMMARY OF HEMATOLOGIC HISTORY: Evaluated by Dr. Al Pimple on 03/07/2020 for persistent leukocytosis   Peripheral blood flow cytometry, BCR/ABL, and JAK 2 were negative  Infectious work up negative (hepatitis and HIV)  Mom with pancreatic cancer, underwent genetic testing on 06/13/2021 Negative. The CancerNext-Expanded gene panel offered by [facility] and includes sequencing and rearrangement analysis for the following 77 genes: AIP, ALK, APC*, ATM*, AXIN2, BAP1, BARD1, BMPR1A, BRCA1*, BRCA2*, BRIP1*, CDC73, CDH1*, CDK4, CDKN1B, CDKN2A, CHEK2*, CTNNA1, DICER1, FH, FLCN, KIF1B, LZTR1, MAX, MEN1, MET, MLH1*, MSH2*, MSH3, MSH6*, MUTYH*, NF1*, NF2, NTHL1, PALB2*, PHOX2B, PMS2*, POT1, PRKAR1A, PTCH1, PTEN*, RAD51C*, RAD51D*, RB1, RET, SDHA, SDHAF2, SDHB, SDHC, SDHD, SMAD4, SMARCA4, SMARCB1, SMARCE1, STK11, SUFU, TMEM127, TP53*, TSC1, TSC2, and VHL (sequencing and deletion/duplication); EGFR, EGLN1, HOXB13, KIT, MITF, PDGFRA, POLD1, and POLE (sequencing only); EPCAM and GREM1 (deletion/duplication only). DNA and RNA analyses performed for * genes.   CURRENT THERAPY: observation  INTERVAL HISTORY: Stanley [name] 57 y.o. male returns for follow-up of his elevated white blood cell count.  He tells me that his primary care provider is very concerned about this elevated white blood cell count.  He has a history of alcohol use is previous noncompliance with medical recommendations.  About a month ago he experienced a heart attack and has not drank any alcohol since.  His white blood cell count is 13 today slightly elevated and his hemoglobin is normal.   Patient Active Problem List   Diagnosis Date Noted   Neutrophilic leukocytosis 07/31/2022   Alcoholic intoxication without complication (HCC) 06/11/2022   Cardiogenic shock (HCC) 06/10/2022   Acute  hypoxemic respiratory failure (HCC) 06/09/2022   Acute on chronic combined systolic and diastolic CHF (congestive heart failure) (HCC) 06/09/2022   AICD discharge 06/08/2022   Abdominal pain 06/08/2022   Anxiety and depression 06/08/2022   Genetic testing 06/13/2021   Family history of breast cancer 05/30/2021   Family history of pancreatic cancer 05/30/2021   Diverticulosis of colon without hemorrhage    Hematemesis 05/18/2021   ABLA (acute blood loss anemia) 05/18/2021   Benign neoplasm of colon    Abdominal pain, epigastric    Early satiety    Dysphagia    Gastroesophageal reflux disease 04/23/2020   Colon cancer screening 04/23/2020   CAD (coronary artery disease) 07/07/2019   Congestive heart failure (CHF) (HCC) 07/07/2019   Moderate episode of recurrent major depressive disorder (HCC) 01/11/2019   Chronic systolic heart failure (HCC)    Obstructive sleep apnea 08/11/2018   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  MEDICAL HISTORY: Past Medical History:  Diagnosis Date   Acute renal failure (ARF) (HCC)    Acute respiratory failure with hypoxia (HCC) 12/29/2018   Alcohol abuse    Anxiety    CAD (coronary artery disease)    a. dx unclear, reported PCI in 2011 but cath 2014 normal coronaries and cardiac CT 07/2016 normal coronaries, no mention of stents.  Chronic chest pain    Chronic systolic CHF (congestive heart failure) (HCC)    Depression    Dyspnea    07/05/2019: per patient some  times with exertion, "has to catch breath"   Family history of breast cancer 05/30/2021   Family history of pancreatic cancer 05/30/2021   Financial difficulties    Gout    Gouty arthritis    Headache    History of noncompliance with medical treatment    financial challenges   Hypertension    ICD (implantable cardioverter-defibrillator) in place    [place] [employer] ICD implant done in [place] 2015   Insomnia    Lobar pneumonia (HCC) 12/28/2018   Myocardial infarction (HCC) 2005   Nonischemic cardiomyopathy (HCC)    Sleep apnea    per patient, has CPAP does not wear it every night   Ventricular tachycardia (HCC) 01/2015   2017 - ATP delivered for sinus tach, degenerated into VT for which he received shock. Recurrent VT 03/2018.    SURGICAL HISTORY: Past Surgical History:  Procedure Laterality Date   BAROREFLEX SYSTEM INSERTION     BIOPSY  03/06/2021   Procedure: BIOPSY;  Surgeon: Benancio Deeds, MD;  Location: [facility];  Service: Gastroenterology;;   CARDIAC DEFIBRILLATOR PLACEMENT  06/05/2013   [employer] Inogen ICD implanted at [facility] in [place] for primary prevention   COLONOSCOPY WITH PROPOFOL N/A 03/06/2021   Procedure: COLONOSCOPY WITH PROPOFOL;  Surgeon: Benancio Deeds, MD;  Location: [facility];  Service: Gastroenterology;  Laterality: N/A;   ESOPHAGEAL DILATION  03/06/2021   Procedure: ESOPHAGEAL DILATION;  Surgeon: Benancio Deeds, MD;  Location: [facility];  Service: Gastroenterology;;   ESOPHAGOGASTRODUODENOSCOPY (EGD) WITH PROPOFOL N/A 03/06/2021   Procedure: ESOPHAGOGASTRODUODENOSCOPY (EGD) WITH PROPOFOL;  Surgeon: Benancio Deeds, MD;  Location: [facility];  Service: Gastroenterology;  Laterality: N/A;   ESOPHAGOGASTRODUODENOSCOPY (EGD) WITH PROPOFOL N/A 05/18/2021   Procedure: ESOPHAGOGASTRODUODENOSCOPY (EGD) WITH PROPOFOL;  Surgeon: Sherrilyn Rist, MD;  Location: [facility] ENDOSCOPY;  Service: Gastroenterology;  Laterality: N/A;    HERNIA REPAIR     PERCUTANEOUS CORONARY STENT INTERVENTION (PCI-S)  01/05/2009   POLYPECTOMY  03/06/2021   Procedure: POLYPECTOMY;  Surgeon: Benancio Deeds, MD;  Location: [facility];  Service: Gastroenterology;;   RIGHT HEART CATH N/A 06/11/2022   Procedure: RIGHT HEART CATH;  Surgeon: Laurey Morale, MD;  Location: [facility] INVASIVE CV LAB;  Service: Cardiovascular;  Laterality: N/A;    SOCIAL HISTORY: Social History   Socioeconomic History   Marital status: Divorced    Spouse name: Not on file   Number of children: 1   Years of education: 12   Highest education level: Not on file  Occupational History   Occupation: Disability  Tobacco Use   Smoking status: Never   Smokeless tobacco: Never  Vaping Use   Vaping status: Never Used  Substance and Sexual Activity   Alcohol use: Not Currently    Comment: Last drink before 06/08/2022   Drug use: No   Sexual activity: Not Currently  Other Topics Concern   Not on file  Social History Narrative   Lives in [place] alone.  Disabled.  Previously worked as a Geophysicist/field seismologist.   Fun: Rest, Read and watch movies.    Social Determinants of Health   Financial Resource Strain: High Risk (06/30/2022)   Overall Financial Resource Strain (CARDIA)    Difficulty of Paying Living Expenses: Hard  Food Insecurity: Food Insecurity Present (07/14/2022)   Hunger  Vital Sign    Worried About Programme researcher, broadcasting/film/video in the Last Year: Sometimes true    Ran Out of Food in the Last Year: Sometimes true  Transportation Needs: Unmet Transportation Needs (07/13/2022)   PRAPARE - Transportation    Lack of Transportation (Medical): No    Lack of Transportation (Non-Medical): Yes  Physical Activity: Unknown (03/21/2018)   Exercise Vital Sign    Days of Exercise per Week: Patient declined    Minutes of Exercise per Session: Patient declined  Stress: Stress Concern Present (03/21/2018)   [name] Occupational [employer] - Occupational Stress  Questionnaire    Feeling of Stress : To some extent  Social Connections: Unknown (03/21/2018)   Social Connection and Isolation Panel [NHANES]    Frequency of Communication with Friends and Family: Patient declined    Frequency of Social Gatherings with Friends and Family: Patient declined    Attends Religious Services: Patient declined    Database administrator or Organizations: Patient declined    Attends Banker Meetings: Patient declined    Marital Status: Patient declined  Intimate Partner Violence: Not At Risk (06/11/2022)   Humiliation, Afraid, Rape, and Kick questionnaire    Fear of Current or Ex-Partner: No    Emotionally Abused: No    Physically Abused: No    Sexually Abused: No    FAMILY HISTORY: Family History  Problem Relation Age of Onset   Breast cancer Mother    Pancreatic cancer Mother    Hypertension Father    Alzheimer's disease Father    Gout Father    Dementia Father    Hypertension Sister    Clotting disorder Sister    Obesity Brother    Bronchitis Brother    Cancer Maternal Uncle        unknown type; dx after 29; x2 maternal uncles   Breast cancer Paternal Aunt        dx after 50   Lung cancer Paternal Uncle    Heart disease Maternal Grandmother    Gout Paternal Grandfather    Colon polyps Neg Hx    Colon cancer Neg Hx    Esophageal cancer Neg Hx    Stomach cancer Neg Hx     Review of Systems  Constitutional:  Negative for appetite change, chills, fatigue, fever and unexpected weight change.  HENT:   Negative for hearing loss, lump/mass and trouble swallowing.   Eyes:  Negative for eye problems and icterus.  Respiratory:  Negative for chest tightness, cough and shortness of breath.   Cardiovascular:  Negative for chest pain, leg swelling and palpitations.  Gastrointestinal:  Negative for abdominal distention, abdominal pain, constipation, diarrhea, nausea and vomiting.  Endocrine: Negative for hot flashes.  Genitourinary:  Negative  for difficulty urinating.   Musculoskeletal:  Negative for arthralgias.  Skin:  Negative for itching and rash.  Neurological:  Negative for dizziness, extremity weakness, headaches and numbness.  Hematological:  Negative for adenopathy. Does not bruise/bleed easily.  Psychiatric/Behavioral:  Negative for depression. The patient is not nervous/anxious.       PHYSICAL EXAMINATION   Onc Performance Status - 07/31/22 1238       ECOG Perf Status   ECOG Perf Status Ambulatory and capable of all selfcare but unable to carry out any work activities.  Up and about more than 50% of waking hours      KPS SCALE   KPS % SCORE Normal activity with effort, some s/s of disease  Vitals:   07/31/22 1237  BP: 115/70  Pulse: 64  Resp: 18  Temp: (!) 97.2 F (36.2 C)  SpO2: 99%    Physical Exam Constitutional:      Appearance: Normal appearance. He is not toxic-appearing.  HENT:     Head: Normocephalic and atraumatic.  Eyes:     General: No scleral icterus. Skin:    General: Skin is warm and dry.  Neurological:     General: No focal deficit present.     Mental Status: He is alert.  Psychiatric:        Mood and Affect: Mood normal.        Behavior: Behavior normal.     LABORATORY DATA:  CBC    Component Value Date/Time   WBC 13.3 (H) 07/31/2022 1209   WBC 9.2 06/16/2022 0145   RBC 4.08 (L) 07/31/2022 1209   HGB 13.0 07/31/2022 1209   HGB 12.8 (L) 07/20/2022 1111   HCT 39.9 07/31/2022 1209   HCT 38.1 07/20/2022 1111   PLT 258 07/31/2022 1209   PLT 266 07/20/2022 1111   MCV 97.8 07/31/2022 1209   MCV 97 07/20/2022 1111   MCH 31.9 07/31/2022 1209   MCHC 32.6 07/31/2022 1209   RDW 11.9 07/31/2022 1209   RDW 11.1 (L) 07/20/2022 1111   LYMPHSABS 2.3 07/31/2022 1209   LYMPHSABS 1.5 07/20/2022 1111   MONOABS 0.9 07/31/2022 1209   EOSABS 0.1 07/31/2022 1209   EOSABS 0.1 07/20/2022 1111   BASOSABS 0.1 07/31/2022 1209   BASOSABS 0.1 07/20/2022 1111     CMP     Component Value Date/Time   NA 139 07/31/2022 1209   NA 137 07/20/2022 1111   K 4.0 07/31/2022 1209   CL 111 07/31/2022 1209   CO2 22 07/31/2022 1209   GLUCOSE 82 07/31/2022 1209   BUN 23 (H) 07/31/2022 1209   BUN 32 (H) 07/20/2022 1111   CREATININE 1.40 (H) 07/31/2022 1209   CALCIUM 9.6 07/31/2022 1209   PROT 7.3 07/31/2022 1209   PROT 7.0 07/20/2022 1111   ALBUMIN 3.9 07/31/2022 1209   ALBUMIN 4.0 07/20/2022 1111   AST 17 07/31/2022 1209   ALT 19 07/31/2022 1209   ALKPHOS 50 07/31/2022 1209   BILITOT 0.6 07/31/2022 1209   GFRNONAA 58 (L) 07/31/2022 1209   GFRAA 73 02/19/2020 1054       ASSESSMENT and THERAPY PLAN:   Neutrophilic leukocytosis Stanley [name] is a 57 year old man here today for follow-up of his leukocytosis.  His white blood cell is slightly elevated however given the fact that he recently had a heart attack it is likely due to this.  I reviewed that with him in detail and he verbalized understanding.  We will keep an eye on it and recheck it in 3 months as he is concerned.  I reviewed with Stanley [name] that alcohol in large amounts can be difficult for the bone marrow and can lead to anemia.  I am happy that he is quit and his hemoglobin has since normalized.  His mom had pancreatic cancer he has no new family history of pancreatic cancer.  He will let us know if that changes.  His genetic testing was negative.  Keziah will return as indicated above.  I congratulated him on working on healthier habits.  RTC in 3 months for labs and f/u.    All questions were answered. The patient knows to call the clinic with any problems, questions or concerns. We can certainly see the patient  much sooner if necessary.  Total encounter time:20 minutes*in face-to-face visit time, chart review, lab review, care coordination, order entry, and documentation of the encounter time.    Lillard Anes, NP 07/31/22 5:09 PM Medical Oncology and Hematology [facility] [address] [place], [place] [address] Tel. [phone]    Fax. [phone]  *Total Encounter Time as defined by the Centers for Medicare and Medicaid Services includes, in addition to the face-to-face time of a patient visit (documented in the note above) non-face-to-face time: obtaining and reviewing outside history, ordering and reviewing medications, tests or procedures, care coordination (communications with other health care professionals or caregivers) and documentation in the medical record.

## 2022-08-03 ENCOUNTER — Telehealth (HOSPITAL_COMMUNITY): Payer: Self-pay | Admitting: Licensed Clinical Social Worker

## 2022-08-03 NOTE — Progress Notes (Signed)
ADVANCED HF CLINIC NOTE  PCP: [name], NP EP: Dr. Lalla Brothers HF Cardiologist: Dr. Gala Romney  HPI:  Stanley [name] is a 57 y.o. male with systolic HF due to NICM d/x'd in 2006 (felt 2/2 HTN/ETOH), ? CAD s/p PCI to unknown vessel 2011 at outside location (cath 2014 without CAD and normal coronaries by CT 07/2016), VT s/p [facility] ICD implant ([place] 2015), noncompliance, chronic noncardiac chest pain, depression, HTN and ETOH abuse.    In 2017 he received inappropriate therapy for ST with ATP (after a work out at [employer] and reported noncompliance with meds) that degenerated into VT and received several shocks. He has had issues with chronic chest pain without evidence for ischemic etiology including normal cath and cardiac CT as above. This is relatively unchanged. Echo 03/2016  EF 35-40%, grade 1 DD, mild MR, moderate LAE  Had recurrent ICD shock in 3/20 in setting of medicine noncompliance. Amio continued. Echo 3/20 EF 25-30% Normal RV.   Echo 3/21: EF 25-30% RV ok S/p Barostim 7/21 Myoview 5/22: EF 34% fixed inferior defect  Admitted 6/24 with recurrent VT. 2/2 intoxication and metabolic derangement (K 3.1, Mag 1.2). Started on amiodarone gtt. Echo showed EF < 20%, RV mildly reduced. Required pressors and ICU monitoring due to worsening respiratory failure and ETOH withdrawal. Underwent RHC showing elevated RA pressures, normal PCWP, mild to moderate PAH and decreased output with CI (Thermo) 1.7. DBA gtt started. Drips weaned and amio transitioned to oral. GDMT titrated, but limited by AKI. He was discharged home, weight 136 lbs.  Today he returns for post hospital HF follow up. Overall feeling fine. He is not SOB with any activity. Denies abnormal bleeding, palpitations, CP, dizziness, edema, or PND/Orthopnea. Appetite ok. No fever or chills. Weight at home 137 pounds. Taking all medications. He does not wear CPAP, unable to tolerate mask.  No tobacco use, no further ETOH since  discharge. Sees PCP soon, hoping to started medication for ETOH use disorder (anabuse?). Living with sister now but will be going back to his apartment next week. He has food insecurity.   Cardiac Studies - RHC (6/24): RA mean 10, PA 50/23 (mean 34), PCWP mean 11, CO/CI (Fick) 3.87/2.25, CO/CI (thermo) 2.92/1.78,  PVR 5.9  WU, PAPi 2.7  - Echo (6/24): EF < 20%, grade I DD, RV mildly reduced, moderate MR, moderate to severe TR  - Myoview (5/22): EF 34% fixed inferior defect  - S/p Barostim (7/21)  - Echo (3/21): EF 25-30% RV ok  - CPX (09/19/18) FVC 1.96 (57%)      FEV1 1.60 (58%)        FEV1/FVC 81 (101%)        MVV 80 (61%)  Resting HR: 77 Peak HR: 131   (79% age predicted max HR)  BP rest: 168/80          Standing BP: 156/74 BP peak: 196/80   Peak VO2: 14.9 (52% predicted peak VO2)  VE/VCO2 slope:  31  OUES: 1.43  Peak RER: 1.05  VE/MVV:  50% O2pulse:  10   (67% predicted O2pulse)  Moderate to severe HF limitation but normal VeVCO2 slope is reassuring. Restrictive lung physiology.   - Echo (3/20) EF 25-30% Normal RV.   - Echo (3/18):  EF 35-40%, grade 1 DD, mild MR, moderate LAE  Past Medical History:  Diagnosis Date   Acute renal failure (ARF) (HCC)    Acute respiratory failure with hypoxia (HCC) 12/29/2018   Alcohol  abuse    Anxiety    CAD (coronary artery disease)    a. dx unclear, reported PCI in 2011 but cath 2014 normal coronaries and cardiac CT 07/2016 normal coronaries, no mention of stents.   Chronic chest pain    Chronic systolic CHF (congestive heart failure) (HCC)    Depression    Dyspnea    07/05/2019: per patient some times with exertion, "has to catch breath"   Family history of breast cancer 05/30/2021   Family history of pancreatic cancer 05/30/2021   Financial difficulties    Gout    Gouty arthritis    Headache    History of noncompliance with medical treatment    financial challenges   Hypertension    ICD (implantable cardioverter-defibrillator)  in place    [employer] ICD implant done in [place] 2015   Insomnia    Lobar pneumonia (HCC) 12/28/2018   Myocardial infarction (HCC) 2005   Nonischemic cardiomyopathy (HCC)    Sleep apnea    per patient, has CPAP does not wear it every night   Ventricular tachycardia (HCC) 01/2015   2017 - ATP delivered for sinus tach, degenerated into VT for which he received shock. Recurrent VT 03/2018.   Current Outpatient Medications  Medication Sig Dispense Refill   allopurinol (ZYLOPRIM) 300 MG tablet Take 1 tablet (300 mg total) by mouth daily. 30 tablet 6   amiodarone (PACERONE) 200 MG tablet Take 1 tablet (200 mg total) by mouth daily. 90 tablet 1   aspirin EC 81 MG tablet Take 81 mg by mouth daily. Swallow whole.     atorvastatin (LIPITOR) 20 MG tablet Take 1 tablet (20 mg total) by mouth daily. 90 tablet 1   digoxin (LANOXIN) 0.125 MG tablet Take 0.5 tablets (0.0625 mg total) by mouth daily. 45 tablet 1   empagliflozin (JARDIANCE) 10 MG TABS tablet Take 1 tablet (10 mg total) by mouth daily. 90 tablet 1   furosemide (LASIX) 80 MG tablet MAY TAKE 1 TABLET AS NEEDED FOR SWELLING 90 tablet 5   nitroGLYCERIN (NITROSTAT) 0.4 MG SL tablet Place 1 tablet (0.4 mg total) under the tongue every 5 (five) minutes for 3 doses as needed for chest pain. 25 tablet 6   potassium chloride SA (KLOR-CON M) 20 MEQ tablet Take 1 tablet (20 mEq total) by mouth daily. 90 tablet 1   spironolactone (ALDACTONE) 25 MG tablet Take 1 tablet (25 mg total) by mouth daily. 90 tablet 1   traZODone (DESYREL) 150 MG tablet Take 1 tablet (150 mg total) by mouth at bedtime. 90 tablet 1   No current facility-administered medications for this visit.   Allergies  Allergen Reactions   Lisinopril Shortness Of Breath    Makes lips swell   Allopurinol Nausea Only    dizziness   Entresto [Sacubitril-Valsartan]     History of angioedema   Other Swelling    Nuts - Lip Swelling   Shellfish Allergy Swelling    Lip Swelling   Social  History   Socioeconomic History   Marital status: Divorced    Spouse name: Not on file   Number of children: 1   Years of education: 12   Highest education level: Not on file  Occupational History   Occupation: Disability  Tobacco Use   Smoking status: Never   Smokeless tobacco: Never  Vaping Use   Vaping status: Never Used  Substance and Sexual Activity   Alcohol use: Not Currently    Comment: Last drink before  06/08/2022   Drug use: No   Sexual activity: Not Currently  Other Topics Concern   Not on file  Social History Narrative   Lives in [place] alone.  Disabled.  Previously worked as a Geophysicist/field seismologist.   Fun: Rest, Read and watch movies.    Social Determinants of Health   Financial Resource Strain: High Risk (06/30/2022)   Overall Financial Resource Strain (CARDIA)    Difficulty of Paying Living Expenses: Hard  Food Insecurity: Food Insecurity Present (07/14/2022)   Hunger Vital Sign    Worried About Running Out of Food in the Last Year: Sometimes true    Ran Out of Food in the Last Year: Sometimes true  Transportation Needs: Unmet Transportation Needs (08/03/2022)   PRAPARE - Administrator, Civil Service (Medical): Yes    Lack of Transportation (Non-Medical): Yes  Physical Activity: Unknown (03/21/2018)   Exercise Vital Sign    Days of Exercise per Week: Patient declined    Minutes of Exercise per Session: Patient declined  Stress: Stress Concern Present (03/21/2018)   [employer] of Occupational Health - Occupational Stress Questionnaire    Feeling of Stress : To some extent  Social Connections: Unknown (03/21/2018)   Social Connection and Isolation Panel [NHANES]    Frequency of Communication with Friends and Family: Patient declined    Frequency of Social Gatherings with Friends and Family: Patient declined    Attends Religious Services: Patient declined    Database administrator or Organizations: Patient declined    Attends Tax inspector Meetings: Patient declined    Marital Status: Patient declined  Intimate Partner Violence: Not At Risk (06/11/2022)   Humiliation, Afraid, Rape, and Kick questionnaire    Fear of Current or Ex-Partner: No    Emotionally Abused: No    Physically Abused: No    Sexually Abused: No   Family History  Problem Relation Age of Onset   Breast cancer Mother    Pancreatic cancer Mother    Hypertension Father    Alzheimer's disease Father    Gout Father    Dementia Father    Hypertension Sister    Clotting disorder Sister    Obesity Brother    Bronchitis Brother    Cancer Maternal Uncle        unknown type; dx after 28; x2 maternal uncles   Breast cancer Paternal Aunt        dx after 50   Lung cancer Paternal Uncle    Heart disease Maternal Grandmother    Gout Paternal Grandfather    Colon polyps Neg Hx    Colon cancer Neg Hx    Esophageal cancer Neg Hx    Stomach cancer Neg Hx    There were no vitals taken for this visit.  Wt Readings from Last 3 Encounters:  07/31/22 67.2 kg (148 lb 3.2 oz)  07/14/22 63.1 kg (139 lb 3.2 oz)  06/29/22 63.3 kg (139 lb 9.6 oz)   PHYSICAL EXAM: General:  NAD. No resp difficulty, walked into clinic HEENT: Normal Neck: Supple. No JVD. Carotids 2+ bilat; no bruits. No lymphadenopathy or thryomegaly appreciated. Cor: PMI nondisplaced. Regular rate & rhythm. No rubs, gallops or murmurs. Lungs: Clear Abdomen: Soft, nontender, nondistended. No hepatosplenomegaly. No bruits or masses. Good bowel sounds. Extremities: No cyanosis, clubbing, rash, edema Neuro: Alert & oriented x 3, cranial nerves grossly intact. Moves all 4 extremities w/o difficulty. Affect pleasant.  ECG (personally reviewed): artifact from barostim, NSR with  IVCD QRS 136 msec  Device interrogation (personally reviewed): no further VT since 06/08/22, average HR 71 bpm, 3.3 hr/day activity  ASSESSMENT & PLAN: 1. VT  - Shocks from [employer] ICD (06/08/22), 2/2 hypomag  and hypokalemia, ETOH abuse and medication noncompliance. - No prior chest pain, prior coronary workups have shown no significant coronary disease.  - Continue amiodarone taper, per EP recs - No further VT on device interrogation today - Check BMET and Mag today. - No driving x 6 months  2. Chronic systolic CHF - Mostly NICM (? H/o PCI to unknown vessel in 2011.) CTA 2018 normal cors - s/p [employer] ICD - Echo (3/20): EF 25-30% - CPX 9/20 moderate to severe HF limitation. pVO2: 14.9 (52% predicted peak VO2).  slope: 31  pRER: 1.05  - Echo (03/10/19): EF 25-30% RV ok Mild MR - Myoview (5/22): EF 34% fixed inferior defect - Echo (6/24): EF < 20%, RV mildly reduced (in setting of VT), moderate-severe TR, moderate MR.  - RHC (6/24): RA 10, PCWP 11, CO/CI (Thermo) 2.9/1.78. >>started on DBA - CMP likely 2/2 ETOH. - Stable NYHA I. Volume looks good today. - Continue digoxin 0.0625 mg daily. - Continue spironolactone 25 mg daily. - Continue Lasix 40 mg PRN - Continue Jardiance 10 mg daily. - No ARNi/ACE (angioedema with Entresto and lisinopril). - Not candidate for advanced therapies at this time with heavy ETOH abuse.  - Labs today. - Repeat echo after back on GDMT  3. ETOH abuse - Heavy ETOH use, likely contributes to cardiomyopathy.  - Stopped since discharge. Congratulated - Encouraged him to have a plan to stay sober  4. CKD 3  - Baseline SCr 1.3-1.6 - Continue SGLT2i - Labs today.  5. HTN - BP controlled. - GDMT as above  6. CAD - h/o single vessel PCI by reo - Myoview (5/22): EF 34% fixed inferior defect - No chest pain - Continue ASA - Continue statin  7. OSA - Unable to tolerate CPAP - Discuss referral to sleep medicine next visit  8. SDOH - HFSW helping with transportation & resources - He has Medicare  Follow up in 3-4 weeks with PharmD (add losartan), 6 weeks with APP (add beta blocker) and 3 months with Dr. Gala Romney + echo  [place],  FNP-BC 08/03/22

## 2022-08-03 NOTE — Telephone Encounter (Signed)
H&V Care Navigation CSW Progress Note  Clinical Social Worker informed pt in need of transport for appt Wednesday.  CSW arranged through [facility].   SDOH Screenings   Food Insecurity: Food Insecurity Present (07/14/2022)  Housing: Patient Declined (06/24/2022)  Transportation Needs: Unmet Transportation Needs (08/03/2022)  Utilities: Not At Risk (06/11/2022)  Alcohol Screen: Low Risk  (02/07/2019)  Depression (PHQ2-9): Medium Risk (06/29/2022)  Financial Resource Strain: High Risk (06/30/2022)  Physical Activity: Unknown (03/21/2018)  Social Connections: Unknown (03/21/2018)  Stress: Stress Concern Present (03/21/2018)  Tobacco Use: Low Risk  (07/31/2022)    08/03/2022  Stanley [name] DOB: 1965/10/10 MRN: [id number]   RIDER WAIVER AND RELEASE OF LIABILITY  For the purposes of helping with transportation needs, [facility] partners with outside transportation providers (taxi companies, [place], Catering manager.) to give [employer] patients or other approved people the choice of on-demand rides [employer]") to our buildings for non-emergency visits.  By using [employer], I, the person signing this document, on behalf of myself and/or any legal minors (in my care using the [employer]), agree:  Science writer given to me are supplied by independent, outside transportation providers who do not work for, or have any affiliation with, [employer]. [place] is not a transportation company. [place] has no control over the quality or safety of the rides I get using [employer]. [place] has no control over whether any outside ride will happen on time or not. [place] gives no guarantee on the reliability, quality, safety, or availability on any rides, or that no mistakes will happen. I know and accept that traveling by vehicle (car, truck, SVU, Zenaida Niece, bus, taxi, etc.) has risks of serious injuries such as disability, being paralyzed, and death. I know and agree the  risk of using [employer] is mine alone, and not [employer]. Transport Services are provided "as is" and as are available. The transportation providers are in charge for all inspections and care of the vehicles used to provide these rides. I agree not to take legal action against [place], its agents, employees, officers, directors, representatives, insurers, attorneys, assigns, successors, subsidiaries, and affiliates at any time for any reasons related directly or indirectly to using [employer]. I also agree not to take legal action against [place] or its affiliates for any injury, death, or damage to property caused by or related to using [employer]. I have read this Waiver and Release of Liability, and I understand the terms used in it and their legal meaning. This Waiver is freely and voluntarily given with the understanding that my right (or any legal minors) to legal action against  relating to [employer] is knowingly given up to use these services.   I attest that I read the Ride Waiver and Release of Liability to Stanley [name], gave Mr. [place]eno the opportunity to ask questions and answered the questions asked (if any). I affirm that Bracyn Amicone then provided consent for assistance with transportation.     Burna Sis

## 2022-08-04 ENCOUNTER — Telehealth (HOSPITAL_COMMUNITY): Payer: Self-pay

## 2022-08-04 NOTE — Telephone Encounter (Signed)
Called to confirm/remind patient of their appointment at the [facility] on 08/05/22.   Patient reminded to bring all medications and/or complete list.  Confirmed patient has transportation. Gave directions, instructed to utilize valet parking.  Confirmed appointment prior to ending call.

## 2022-08-05 ENCOUNTER — Ambulatory Visit (HOSPITAL_COMMUNITY)
Admission: RE | Admit: 2022-08-05 | Discharge: 2022-08-05 | Disposition: A | Discharge status: Home / Self Care | Payer: Self-pay | Source: Ambulatory Visit | Attending: Family Medicine | Admitting: Family Medicine

## 2022-08-05 ENCOUNTER — Encounter (HOSPITAL_COMMUNITY): Payer: Self-pay

## 2022-08-05 VITALS — BP 134/76 | HR 81 | Wt 144.8 lb

## 2022-08-05 DIAGNOSIS — E876 Hypokalemia: Secondary | ICD-10-CM | POA: Insufficient documentation

## 2022-08-05 DIAGNOSIS — I130 Hypertensive heart and chronic kidney disease with heart failure and stage 1 through stage 4 chronic kidney disease, or unspecified chronic kidney disease: Secondary | ICD-10-CM | POA: Insufficient documentation

## 2022-08-05 DIAGNOSIS — Z79899 Other long term (current) drug therapy: Secondary | ICD-10-CM | POA: Insufficient documentation

## 2022-08-05 DIAGNOSIS — Z7984 Long term (current) use of oral hypoglycemic drugs: Secondary | ICD-10-CM | POA: Insufficient documentation

## 2022-08-05 DIAGNOSIS — Z5982 Transportation insecurity: Secondary | ICD-10-CM | POA: Insufficient documentation

## 2022-08-05 DIAGNOSIS — I472 Ventricular tachycardia, unspecified: Secondary | ICD-10-CM

## 2022-08-05 DIAGNOSIS — I2510 Atherosclerotic heart disease of native coronary artery without angina pectoris: Secondary | ICD-10-CM

## 2022-08-05 DIAGNOSIS — F32A Depression, unspecified: Secondary | ICD-10-CM | POA: Insufficient documentation

## 2022-08-05 DIAGNOSIS — I10 Essential (primary) hypertension: Secondary | ICD-10-CM

## 2022-08-05 DIAGNOSIS — F1010 Alcohol abuse, uncomplicated: Secondary | ICD-10-CM

## 2022-08-05 DIAGNOSIS — N1831 Chronic kidney disease, stage 3a: Secondary | ICD-10-CM

## 2022-08-05 DIAGNOSIS — Z139 Encounter for screening, unspecified: Secondary | ICD-10-CM

## 2022-08-05 DIAGNOSIS — I5022 Chronic systolic (congestive) heart failure: Secondary | ICD-10-CM

## 2022-08-05 DIAGNOSIS — G4733 Obstructive sleep apnea (adult) (pediatric): Secondary | ICD-10-CM

## 2022-08-05 DIAGNOSIS — Z955 Presence of coronary angioplasty implant and graft: Secondary | ICD-10-CM | POA: Insufficient documentation

## 2022-08-05 DIAGNOSIS — Z91148 Patient's other noncompliance with medication regimen for other reason: Secondary | ICD-10-CM | POA: Insufficient documentation

## 2022-08-05 MED ORDER — LOSARTAN POTASSIUM 25 MG PO TABS: 12.5000 mg | ORAL_TABLET | Freq: Every day | ORAL | 3 refills | Status: DC

## 2022-08-05 NOTE — Patient Instructions (Addendum)
Medication Changes:  START TAKING LOSARTAN 12.5 mg (half a tablet) DAILY    *If you need a refill on your cardiac medications before your next appointment, please call your pharmacy*  Lab Work:  Labs to be done in 10 days.    Follow-Up in:   Your physician recommends that you schedule a follow-up appointment in: 3-4 weeks with Pharm/APP   And 3 months with Dr. Gala Romney with an ECHO   Your physician has requested that you have an echocardiogram. Echocardiography is a painless test that uses sound waves to create images of your heart. It provides your doctor with information about the size and shape of your heart and how well your heart's chambers and valves are working. This procedure takes approximately one hour. There are no restrictions for this procedure. Please do NOT wear cologne, perfume, aftershave, or lotions (deodorant is allowed). Please arrive 15 minutes prior to your appointment time.   Do the following things EVERYDAY: Weigh yourself in the morning before breakfast. Write it down and keep it in a log. Take your medicines as prescribed Eat low salt foods--Limit salt (sodium) to 2000 mg per day.  Stay as active as you can everyday Limit all fluids for the day to less than 2 liters    Need to Contact us:  If you have any questions or concerns before your next appointment please send [place] a message through [place] or call our office at [phone].    TO LEAVE A MESSAGE FOR THE NURSE SELECT OPTION 2, PLEASE LEAVE A MESSAGE INCLUDING: YOUR NAME DATE OF BIRTH CALL BACK NUMBER REASON FOR CALL**this is important as we prioritize the call backs  YOU WILL RECEIVE A CALL BACK THE SAME DAY AS LONG AS YOU CALL BEFORE 4:00 PM   At the [facility], you and your health needs are our priority. As part of our continuing mission to provide you with exceptional heart care, we have created designated Provider Care Teams. These Care Teams include your primary  Cardiologist (physician) and Advanced Practice Providers (APPs- Physician Assistants and Nurse Practitioners) who all work together to provide you with the care you need, when you need it.   You may see any of the following providers on your designated Care Team at your next follow up: Dr Arvilla Meres Dr Marca Ancona Dr. Marcos Eke, NP Robbie Lis, [place] [facility] [place], [place] Brynda Peon, NP Karle Plumber, PharmD   Please be sure to bring in all your medications bottles to every appointment.    Thank you for choosing [facility]

## 2022-08-06 NOTE — Progress Notes (Addendum)
H&V Care Navigation CSW Progress Note  Clinical Social Worker provided pt bus passes to assist with getting to community appts.   Pt also needed taxi to todays appt- CSW arranged with [employer].   SDOH Screenings   Food Insecurity: Food Insecurity Present (07/14/2022)  Housing: Patient Declined (06/24/2022)  Transportation Needs: Unmet Transportation Needs (08/03/2022)  Utilities: Not At Risk (06/11/2022)  Alcohol Screen: Low Risk  (02/07/2019)  Depression (PHQ2-9): Medium Risk (06/29/2022)  Financial Resource Strain: High Risk (06/30/2022)  Physical Activity: Unknown (03/21/2018)  Social Connections: Unknown (03/21/2018)  Stress: Stress Concern Present (03/21/2018)  Tobacco Use: Low Risk  (08/05/2022)    08/06/2022  Stanley [name] DOB: [birth year] MRN: [id number]   RIDER WAIVER AND RELEASE OF LIABILITY  For the purposes of helping with transportation needs, [facility] partners with outside transportation providers (taxi companies, [place], Catering manager.) to give [employer] patients or other approved people the choice of on-demand rides [employer] [facility]") to our buildings for non-emergency visits.  By using [employer], I, the person signing this document, on behalf of myself and/or any legal minors (in my care using the [employer]), agree:  Science writer given to me are supplied by independent, outside transportation providers who do not work for, or have any affiliation with, [employer]. [place] is not a transportation company. [place] has no control over the quality or safety of the rides I get using [employer]. [place] has no control over whether any outside ride will happen on time or not. [place] gives no guarantee on the reliability, quality, safety, or availability on any rides, or that no mistakes will happen. I know and accept that traveling by vehicle (car, truck, SVU, Zenaida Niece, bus, taxi, etc.) has risks of serious injuries such as disability, being  paralyzed, and death. I know and agree the risk of using [employer] is mine alone, and not [employer]. Transport Services are provided "as is" and as are available. The transportation providers are in charge for all inspections and care of the vehicles used to provide these rides. I agree not to take legal action against [place], its agents, employees, officers, directors, representatives, insurers, attorneys, assigns, successors, subsidiaries, and affiliates at any time for any reasons related directly or indirectly to using [employer]. I also agree not to take legal action against [place] or its affiliates for any injury, death, or damage to property caused by or related to using [employer]. I have read this Waiver and Release of Liability, and I understand the terms used in it and their legal meaning. This Waiver is freely and voluntarily given with the understanding that my right (or any legal minors) to legal action against [place] relating to [employer] is knowingly given up to use these services.   I attest that I read the Ride Waiver and Release of Liability to Stanley [name], gave Mr. [place] the opportunity to ask questions and answered the questions asked (if any). I affirm that Caroll Mcgath then provided consent for assistance with transportation.     [facility]

## 2022-08-06 NOTE — Addendum Note (Signed)
Encounter addended by: Burna Sis, LCSW on: 08/06/2022 2:12 PM  Actions taken: Flowsheet accepted, Clinical Note Signed

## 2022-08-13 NOTE — Progress Notes (Signed)
Advanced Heart Failure Clinic Note   PCP: [name], NP EP: Dr. Lalla Brothers HF Cardiologist: Dr. Gala Romney  HPI:  Stanley [name] is a 57 y.o. male with systolic HF due to NICM d/x'd in 2006 (felt 2/2 HTN/ETOH), ? CAD s/p PCI to unknown vessel 2011 at outside location (cath 2014 without CAD and normal coronaries by CT 07/2016), VT s/p [facility] ICD implant ([place] 2015), noncompliance, chronic noncardiac chest pain, depression, HTN and ETOH abuse.     In 2017 he received inappropriate therapy for ST with ATP (after a work out at [employer] and reported noncompliance with meds) that degenerated into VT and received several shocks. He has had issues with chronic chest pain without evidence for ischemic etiology including normal cath and cardiac CT as above. This is relatively unchanged. Echo 03/2016  EF 35-40%, grade 1 DD, mild MR, moderate LAE   Had recurrent ICD shock in 03/2018 in setting of medicine noncompliance. Amiodarone continued. Echo 03/2018 EF 25-30% Normal RV.    Echo 03/2019: EF 25-30% RV ok S/p Barostim 07/2019 Myoview 05/2020: EF 34% fixed inferior defect   Admitted 06/2022 with recurrent VT. 2/2 intoxication and metabolic derangement (K 3.1, Mag 1.2). Started on amiodarone gtt. Echo showed EF < 20%, RV mildly reduced. Required pressors and ICU monitoring due to worsening respiratory failure and ETOH withdrawal. Underwent RHC showing elevated RA pressures, normal PCWP, mild to moderate PH and decreased output with CI (Thermo) 1.7. DBA gtt started. Drips weaned and amiodarone transitioned to oral. GDMT titrated, but limited by AKI. He was discharged home, weight 136 lbs.   Returned to [facility] for follow up 08/05/22. Overall was feeling great.  Doing well taking his meds. No SOB with walking further distances on flat ground, walks around his apartment. Denied palpitations, abnormal bleeding, syncope, CP, dizziness, edema, or PND/Orthopnea. Appetite was ok. No fever or chills. Weight at home  was 138 pounds. Had not needed Lasix. Sober since 06/08/22 from ETOH! Struggling with transportation issues due to driving restrictions.   Today he returns to HF clinic for pharmacist medication titration. At last visit with APP, losartan 12.5 mg daily was initiated. Overall feeling good today, says he feels "the best I've ever been". Says he has not drank alcohol since his hospitalization and is in a much better state of mind now. Main complaint is breast tenderness since starting spironolactone. Has been having a gout flare, plans to talk to PCP about colchicine. Has been avoiding NSAIDs. No dizziness, lightheadedness, or CP. No SOB/DOE. Has been able to walk around his neighborhood (30 minute walk) without needing to stop and rest. Has not been weighing consistently at home. Weight is up 9 lbs from last clinic visit. He takes Lasix 80 mg every other day. Thinks weight gain is due to eating more lately. No LEE, PND or orthopnea.  SBP at home has been ~140 mmHg on home machine, BP was 118/68 in clinic today.     HF Medications: Losartan 12.5 mg daily Spironolactone 25 mg daily Jardiance 10 mg daily Digoxin 0.0625 mg daily Lasix 80 mg PRN - taking every other day  Has the patient been experiencing any side effects to the medications prescribed?  Yes - breast tenderness with spironolactone  Does the patient have any problems obtaining medications due to transportation or finances?   Yes; [employer] Medicare with LIS. Medications sent to [facility] today to ship to patient as he cannot drive.   Understanding of regimen:  fair Understanding of indications: fair Potential of compliance: good Patient understands to avoid NSAIDs. Patient understands to avoid decongestants.    Pertinent Lab Values: 07/31/22: Serum creatinine 1.40, BUN 23, Potassium 4.0, Sodium 139 08/17/22: Serum creatinine 1.39, BUN 26, Potassium 3.8, Sodium 135  Vital Signs:  Weight: 153.8 lbs (last clinic  weight: 144.8 lbs) Blood pressure: 118/68  Heart rate: 63   Assessment/Plan: 1. VT  - Shocks from [employer] ICD (06/08/22), 2/2 hypomag and hypokalemia, ETOH abuse and medication noncompliance. - No prior chest pain, prior coronary workups have shown no significant coronary disease.  - Continue amiodarone per EP recs - No further VT on device interrogation 08/05/22 - No driving x 6 months from event (12/08/22)    2. Chronic systolic CHF - Mostly NICM (? H/o PCI to unknown vessel in 2011.) CTA 2018 normal cors - s/p [employer] ICD - Echo (03/2018): EF 25-30% - CPX 09/2018 moderate to severe HF limitation. pVO2: 14.9 (52% predicted peak VO2).  slope: 31  pRER: 1.05  - Echo (03/10/19): EF 25-30% RV ok Mild MR - Myoview (05/2020): EF 34% fixed inferior defect - Echo (06/2022): EF < 20%, RV mildly reduced (in setting of VT), moderate-severe TR, moderate MR.  - RHC (06/2022): RA 10, PCWP 11, CO/CI (Thermo) 2.9/1.78. >>started on DBA - CMP likely 2/2 ETOH. - Stable NYHA II. Weight up 9 lbs from last clinic visit, but does not appear volume overloaded. No LEE, PND or orthopnea.  - Continue Lasix 80 mg PRN, has been taking every other day.  - Increase losartan to 25 mg daily  (no ARNi/ACE w/ angioedema with Entresto and lisinopril).  - Stop spironolactone as he has been having breast tenderness. Will start eplerenone 25 mg daily.  - Continue Jardiance 10 mg daily. - Continue digoxin 0.0625 mg daily. - Not candidate for advanced therapies at this time with heavy ETOH abuse. Can reconsider if he maintains sobriety.  - Repeat echo scheduled for 10/2022 - Repeat BMET in 4 weeks   3. ETOH abuse - Heavy ETOH use, likely contributes to cardiomyopathy.  - Stopped since discharge. Congratulated - Encouraged him to have a plan to stay sober   4. CKD 3  - Baseline SCr 1.3-1.6 - Continue SGLT2i   5. HTN - BP controlled. - GDMT as above   6. CAD - h/o single vessel PCI by reo - Myoview (05/2020): EF  34% fixed inferior defect - No chest pain - Continue ASA - Continue statin   7. OSA - Unable to tolerate CPAP - Discuss referral to sleep medicine next visit   8. SDOH - HFSW helping with transportation & resources - He has [employer] Medicare  Follow up 6 weeks with Dr. Arlana Lindau, PharmD, BCPS, [facility], CPP Heart Failure Clinic Pharmacist [phone]

## 2022-08-17 ENCOUNTER — Ambulatory Visit (HOSPITAL_COMMUNITY)
Admission: RE | Admit: 2022-08-17 | Discharge: 2022-08-17 | Disposition: A | Discharge status: Home / Self Care | Payer: Self-pay | Source: Ambulatory Visit | Attending: Family Medicine | Admitting: Family Medicine

## 2022-08-17 ENCOUNTER — Telehealth (HOSPITAL_COMMUNITY): Payer: Self-pay | Admitting: Licensed Clinical Social Worker

## 2022-08-17 DIAGNOSIS — I5022 Chronic systolic (congestive) heart failure: Secondary | ICD-10-CM | POA: Insufficient documentation

## 2022-08-17 LAB — BASIC METABOLIC PANEL
Anion gap: 9 (ref 5–15)
BUN: 26 mg/dL — ABNORMAL HIGH (ref 6–20)
CO2: 21 mmol/L — ABNORMAL LOW (ref 22–32)
Calcium: 9 mg/dL (ref 8.9–10.3)
Chloride: 105 mmol/L (ref 98–111)
Creatinine, Ser: 1.39 mg/dL — ABNORMAL HIGH (ref 0.61–1.24)
GFR, Estimated: 58 mL/min — ABNORMAL LOW (ref >60)
Glucose, Bld: 94 mg/dL (ref 70–99)
Potassium: 3.8 mmol/L (ref 3.5–5.1)
Sodium: 135 mmol/L (ref 135–145)

## 2022-08-17 NOTE — Telephone Encounter (Signed)
H&V Care Navigation CSW Progress Note  Clinical Social Worker informed pt needing transportation todays appt as well as next week.  CSW able to arrange taxi.  Pt working on reapplying for Medicaid so he can access medicaid transportation moving forward but application is still pending.   SDOH Screenings   Food Insecurity: Food Insecurity Present (07/14/2022)  Housing: Patient Declined (06/24/2022)  Transportation Needs: Unmet Transportation Needs (08/06/2022)  Utilities: Not At Risk (06/11/2022)  Alcohol Screen: Low Risk  (02/07/2019)  Depression (PHQ2-9): Medium Risk (06/29/2022)  Financial Resource Strain: High Risk (06/30/2022)  Physical Activity: Unknown (03/21/2018)  Social Connections: Unknown (03/21/2018)  Stress: Stress Concern Present (03/21/2018)  Tobacco Use: Low Risk  (08/05/2022)   Burna Sis, LCSW Clinical Social Worker Advanced Heart Failure Clinic Desk#: [phone] Cell#: [phone]

## 2022-08-24 ENCOUNTER — Telehealth (HOSPITAL_COMMUNITY): Payer: Self-pay | Admitting: Pharmacy Technician

## 2022-08-24 ENCOUNTER — Other Ambulatory Visit: Payer: Self-pay

## 2022-08-24 ENCOUNTER — Ambulatory Visit (HOSPITAL_COMMUNITY)
Admission: RE | Admit: 2022-08-24 | Discharge: 2022-08-24 | Disposition: A | Discharge status: Home / Self Care | Payer: Self-pay | Source: Ambulatory Visit | Attending: Internal Medicine | Admitting: Internal Medicine

## 2022-08-24 ENCOUNTER — Other Ambulatory Visit (HOSPITAL_COMMUNITY): Payer: Self-pay

## 2022-08-24 VITALS — BP 118/68 | HR 63 | Wt 153.8 lb

## 2022-08-24 DIAGNOSIS — F1011 Alcohol abuse, in remission: Secondary | ICD-10-CM | POA: Insufficient documentation

## 2022-08-24 DIAGNOSIS — G4733 Obstructive sleep apnea (adult) (pediatric): Secondary | ICD-10-CM | POA: Insufficient documentation

## 2022-08-24 DIAGNOSIS — Z5982 Transportation insecurity: Secondary | ICD-10-CM | POA: Insufficient documentation

## 2022-08-24 DIAGNOSIS — I428 Other cardiomyopathies: Secondary | ICD-10-CM | POA: Insufficient documentation

## 2022-08-24 DIAGNOSIS — I472 Ventricular tachycardia, unspecified: Secondary | ICD-10-CM | POA: Insufficient documentation

## 2022-08-24 DIAGNOSIS — I2510 Atherosclerotic heart disease of native coronary artery without angina pectoris: Secondary | ICD-10-CM | POA: Insufficient documentation

## 2022-08-24 DIAGNOSIS — I5022 Chronic systolic (congestive) heart failure: Secondary | ICD-10-CM

## 2022-08-24 MED ORDER — EPLERENONE 25 MG PO TABS
25.0000 mg | ORAL_TABLET | Freq: Every day | ORAL | 3 refills | Status: DC
  Filled 2022-08-24 (×3): qty 90, 90d supply, fill #0

## 2022-08-24 MED ORDER — LOSARTAN POTASSIUM 25 MG PO TABS
25.0000 mg | ORAL_TABLET | Freq: Every day | ORAL | 3 refills | Status: DC
  Filled 2022-08-24 (×2): qty 90, 90d supply, fill #0

## 2022-08-24 NOTE — Telephone Encounter (Signed)
Patient Advocate Encounter   Received notification from [facility] that prior authorization for Eplerenone is required.   PA submitted on CoverMyMeds Key [id number] Status is pending   Will continue to follow.

## 2022-08-24 NOTE — Telephone Encounter (Signed)
/  Advanced Heart Failure Patient Advocate Encounter  Prior Authorization for Eplerenone has been approved.    PA# [id number] Effective dates: 08/24/22 through 01/05/23  Patients co-pay is $4.50 (30 days)  Archer Asa, CPhT

## 2022-08-24 NOTE — Patient Instructions (Signed)
It was a pleasure seeing you today!  MEDICATIONS: -We are changing your medications today -Stop spironolactone and start eplerenone 25 mg (1 tablet) daily -Increase losartan to 25 mg (1 tablet) daily -Call if you have questions about your medications.  LABS: -We will call you if your labs need attention.  NEXT APPOINTMENT: Return to clinic in 6 weeks with Dr. Gala Romney.  In general, to take care of your heart failure: -Limit your fluid intake to 2 Liters (half-gallon) per day.   -Limit your salt intake to ideally 2-3 grams (2000-3000 mg) per day. -Weigh yourself daily and record, and bring that "weight diary" to your next appointment.  (Weight gain of 2-3 pounds in 1 day typically means fluid weight.) -The medications for your heart are to help your heart and help you live longer.   -Please contact us before stopping any of your heart medications.  Call the clinic at [phone] with questions or to reschedule future appointments.

## 2022-08-26 ENCOUNTER — Ambulatory Visit: Payer: Self-pay | Attending: Student | Admitting: Student

## 2022-08-26 ENCOUNTER — Encounter: Payer: Self-pay | Admitting: Student

## 2022-08-26 NOTE — Progress Notes (Deleted)
Electrophysiology Office Note:   ID:  Stanley [name], DOB [birth year], MRN [id number]  Primary Cardiologist: Arvilla Meres, MD Electrophysiologist: Lanier Prude, MD  {Click to update primary MD,subspecialty MD or APP then REFRESH:1}    History of Present Illness:   Stanley [name] is a 57 y.o. male with h/o HF due to NICM, ETOH abuse, h/o VT, h/o non-compliance, atypical chest pain, depression, and HTN. Pt also reportedly has CAD with PCI to unknown vessel 2011, but cath 2014 without CAD and normal cors by CT 07/2016  Seen today for post hospital follow up.    Admitted 6/24 with recurrent VT. 2/2 intoxication and metabolic derangement (K 3.1, Mag 1.2). Started on amiodarone gtt. Echo showed EF < 20%, RV mildly reduced. Required pressors and ICU monitoring due to worsening respiratory failure and ETOH withdrawal. Underwent RHC showing elevated RA pressures, normal PCWP, mild to moderate PAH and decreased output with CI (Thermo) 1.7. DBA gtt started. Drips weaned and amio transitioned to oral. GDMT titrated, but limited by AKI. He was discharged home, weight 136 lbs.   Seen in HF clinic 08/05/2022 and was doing well. Reported sobriety since 06/08/2022(!)  Since discharge from hospital the patient reports doing ***.  he denies chest pain, palpitations, dyspnea, PND, orthopnea, nausea, vomiting, dizziness, syncope, edema, weight gain, or early satiety.   Review of systems complete and found to be negative unless listed in HPI.   EP Information / Studies Reviewed:    EKG is not ordered today. EKG from 06/24/2022 reviewed which showed NSR with LBBB, though significant artifact from barostim device.        ICD Interrogation-  reviewed in detail today,  See PACEART report.  Device History: {INDUSTRY:28136} {Blank single:19197::"Dual Chamber","Single Chamber","BiV","S-ICD"} ICD implanted *** for *** History of appropriate therapy: {yes/no:20286} History of AAD therapy: {Blank  single:19197::"No","Yes; currently on ***","Yes; previously tolerated ***"}   Risk Assessment/Calculations:   {Does this patient have ATRIAL FIBRILLATION?:[phone]} No BP recorded.  {Refresh Note OR Click here to enter BP  :1}***        Physical Exam:   VS:  There were no vitals taken for this visit.   Wt Readings from Last 3 Encounters:  08/24/22 153 lb 12.8 oz (69.8 kg)  08/05/22 144 lb 12.8 oz (65.7 kg)  07/31/22 148 lb 3.2 oz (67.2 kg)     GEN: Well nourished, well developed in no acute distress NECK: No JVD; No carotid bruits CARDIAC: {EPRHYTHM:28826}, no murmurs, rubs, gallops RESPIRATORY:  Clear to auscultation without rales, wheezing or rhonchi  ABDOMEN: Soft, non-tender, non-distended EXTREMITIES:  No edema; No deformity   ASSESSMENT AND PLAN:    Chronic systolic dysfunction s/p {INDUSTRY:28136} {Blank single:19197::"***","single chamber ICD","dual chamber ICD","CRT-D","S-ICD"}  euvolemic today Stable on an appropriate medical regimen Normal ICD function See Pace Art report No changes today  Disposition:   Follow up with {EPPROVIDERS:28135} {EPFOLLOW UP:28173}   Signed, Graciella Freer, PA-C

## 2022-09-16 ENCOUNTER — Other Ambulatory Visit: Payer: Self-pay | Admitting: Nurse Practitioner

## 2022-09-16 DIAGNOSIS — I10 Essential (primary) hypertension: Secondary | ICD-10-CM

## 2022-09-23 ENCOUNTER — Ambulatory Visit (INDEPENDENT_AMBULATORY_CARE_PROVIDER_SITE_OTHER): Payer: Medicaid Other

## 2022-09-23 DIAGNOSIS — I428 Other cardiomyopathies: Secondary | ICD-10-CM

## 2022-09-23 LAB — CUP PACEART REMOTE DEVICE CHECK
Battery Remaining Longevity: 72 mo
Battery Remaining Percentage: 65 %
Brady Statistic RV Percent Paced: 0 %
Date Time Interrogation Session: 20240918023100
HighPow Impedance: 46 Ohm
Implantable Lead Connection Status: 753985
Implantable Lead Implant Date: 20150601
Implantable Lead Location: 753860
Implantable Lead Model: 295
Implantable Lead Serial Number: 134196
Implantable Pulse Generator Implant Date: 20150601
Lead Channel Impedance Value: 407 Ohm
Lead Channel Pacing Threshold Amplitude: 1.5 V
Lead Channel Pacing Threshold Pulse Width: 0.6 ms
Lead Channel Setting Pacing Amplitude: 3 V
Lead Channel Setting Pacing Pulse Width: 0.6 ms
Lead Channel Setting Sensing Sensitivity: 0.6 mV
Pulse Gen Serial Number: 190689
Zone Setting Status: 755011

## 2022-09-24 NOTE — Progress Notes (Signed)
Electrophysiology Office Note:   ID:  Stanley [name], DOB [birth year], MRN [id number]  Primary Cardiologist: Arvilla Meres, MD Electrophysiologist: Lanier Prude, MD      History of Present Illness:   Stanley [name] is a 57 y.o. male with h/o HF due to NICM, ETOH abuse, h/o VT, h/o non-compliance, atypical chest pain, depression, and HTN. Pt also reportedly has CAD with PCI to unknown vessel 2011, but cath 2014 without CAD and normal cors by CT 07/2016  Seen today for post hospital follow up.    Admitted 6/24 with recurrent VT. 2/2 intoxication and metabolic derangement (K 3.1, Mag 1.2). Started on amiodarone gtt. Echo showed EF < 20%, RV mildly reduced. Required pressors and ICU monitoring due to worsening respiratory failure and ETOH withdrawal. Underwent RHC showing elevated RA pressures, normal PCWP, mild to moderate PAH and decreased output with CI (Thermo) 1.7. DBA gtt started. Drips weaned and amio transitioned to oral. GDMT titrated, but limited by AKI. He was discharged home, weight 136 lbs.   Seen in HF clinic 08/05/2022 and was doing well. Reported sobriety since 06/08/2022(!)  Since discharge from hospital the patient reports doing very well. He remains sober and is feeling great.  he denies chest pain, palpitations, dyspnea, PND, orthopnea, nausea, vomiting, dizziness, syncope, edema, weight gain, or early satiety.   Review of systems complete and found to be negative unless listed in HPI.   EP Information / Studies Reviewed:    EKG is not ordered today. EKG from 06/24/2022 reviewed which showed NSR with LBBB, though significant artifact from barostim device.        ICD Interrogation-  reviewed in detail today,  See PACEART report. Barostim Interrogation - Performed personally. Stable impedance - Battery longevity ~19 months - 05/2024   Device History: Barostim (standard) implanted 07/07/2019 for Chronic systolic CHF [employer] single chamber ICD 06/05/2013 for  chronic systolic CHF   Physical Exam:   VS:  BP 126/74   Pulse 66   Ht 5\' 4"  (1.626 m)   Wt 157 lb 3.2 oz (71.3 kg)   SpO2 96%   BMI 26.98 kg/m    Wt Readings from Last 3 Encounters:  09/25/22 157 lb 3.2 oz (71.3 kg)  08/24/22 153 lb 12.8 oz (69.8 kg)  08/05/22 144 lb 12.8 oz (65.7 kg)     GEN: Well nourished, well developed in no acute distress NECK: No JVD; No carotid bruits CARDIAC: Regular rate and rhythm, no murmurs, rubs, gallops RESPIRATORY:  Clear to auscultation without rales, wheezing or rhonchi  ABDOMEN: Soft, non-tender, non-distended EXTREMITIES:  No edema; No deformity   ASSESSMENT AND PLAN:    Chronic systolic dysfunction s/p [employer] single chamber ICD  Barostim implantation 07/2019 euvolemic today Stable on an appropriate medical regimen Normal ICD function See Pace Art report No changes today  VT Quiescent  Continue Amiodarone 200 mg daily Surveillance labs today.   HTN Stable on current regimen   Atypical chest pain Longstanding. Last <10 seconds.  Stable myoview 05/2020   Disposition:   Follow up with EP APP in 6 months   Signed, Graciella Freer, PA-C

## 2022-09-25 ENCOUNTER — Encounter: Payer: Self-pay | Admitting: Student

## 2022-09-25 ENCOUNTER — Ambulatory Visit: Payer: Self-pay | Attending: Student | Admitting: Student

## 2022-09-25 VITALS — BP 126/74 | HR 66 | Ht 64.0 in | Wt 157.2 lb

## 2022-09-25 DIAGNOSIS — I472 Ventricular tachycardia, unspecified: Secondary | ICD-10-CM

## 2022-09-25 DIAGNOSIS — N1831 Chronic kidney disease, stage 3a: Secondary | ICD-10-CM

## 2022-09-25 DIAGNOSIS — I5022 Chronic systolic (congestive) heart failure: Secondary | ICD-10-CM

## 2022-09-25 DIAGNOSIS — F1010 Alcohol abuse, uncomplicated: Secondary | ICD-10-CM

## 2022-09-25 DIAGNOSIS — Z5181 Encounter for therapeutic drug level monitoring: Secondary | ICD-10-CM

## 2022-09-25 LAB — CUP PACEART INCLINIC DEVICE CHECK
Date Time Interrogation Session: 20240920111521
HighPow Impedance: 38 Ohm
HighPow Impedance: 51 Ohm
Implantable Lead Connection Status: 753985
Implantable Lead Implant Date: 20150601
Implantable Lead Location: 753860
Implantable Lead Model: 295
Implantable Lead Serial Number: 134196
Implantable Pulse Generator Implant Date: 20150601
Lead Channel Impedance Value: 420 Ohm
Lead Channel Pacing Threshold Amplitude: 1.4 V
Lead Channel Pacing Threshold Pulse Width: 0.6 ms
Lead Channel Sensing Intrinsic Amplitude: 12.5 mV
Lead Channel Setting Pacing Amplitude: 3 V
Lead Channel Setting Pacing Pulse Width: 0.6 ms
Lead Channel Setting Sensing Sensitivity: 0.6 mV
Pulse Gen Serial Number: 190689
Zone Setting Status: 755011

## 2022-09-25 NOTE — Patient Instructions (Addendum)
Medication Instructions:  Your physician recommends that you continue on your current medications as directed. Please refer to the Current Medication list given to you today.  *If you need a refill on your cardiac medications before your next appointment, please call your pharmacy*  Lab Work: CBC, CMET, TSH, FreeT4, MAG-TODAY If you have labs (blood work) drawn today and your tests are completely normal, you will receive your results only by: MyChart Message (if you have MyChart) OR A paper copy in the mail If you have any lab test that is abnormal or we need to change your treatment, we will call you to review the results.  Follow-Up: At [facility], you and your health needs are our priority.  As part of our continuing mission to provide you with exceptional heart care, we have created designated Provider Care Teams.  These Care Teams include your primary Cardiologist (physician) and Advanced Practice Providers (APPs -  Physician Assistants and Nurse Practitioners) who all work together to provide you with the care you need, when you need it.  Your next appointment:   6 month(s)  Provider:   Casimiro Needle "Otilio Saber, PA-C

## 2022-09-26 LAB — COMPREHENSIVE METABOLIC PANEL
ALT: 19 IU/L (ref 0–44)
AST: 25 IU/L (ref 0–40)
Albumin: 4.1 g/dL (ref 3.8–4.9)
Alkaline Phosphatase: 51 IU/L (ref 44–121)
BUN/Creatinine Ratio: 21 — ABNORMAL HIGH (ref 9–20)
BUN: 26 mg/dL — ABNORMAL HIGH (ref 6–24)
Bilirubin Total: 0.4 mg/dL (ref 0.0–1.2)
CO2: 18 mmol/L — ABNORMAL LOW (ref 20–29)
Calcium: 9.6 mg/dL (ref 8.7–10.2)
Chloride: 103 mmol/L (ref 96–106)
Creatinine, Ser: 1.25 mg/dL (ref 0.76–1.27)
Globulin, Total: 3.6 g/dL (ref 1.5–4.5)
Glucose: 83 mg/dL (ref 70–99)
Potassium: 4 mmol/L (ref 3.5–5.2)
Sodium: 139 mmol/L (ref 134–144)
Total Protein: 7.7 g/dL (ref 6.0–8.5)
eGFR: 66 mL/min/{1.73_m2} (ref >59)

## 2022-09-26 LAB — CBC
Hematocrit: 44.8 % (ref 37.5–51.0)
Hemoglobin: 13.9 g/dL (ref 13.0–17.7)
MCH: 30.3 pg (ref 26.6–33.0)
MCHC: 31 g/dL — ABNORMAL LOW (ref 31.5–35.7)
MCV: 98 fL — ABNORMAL HIGH (ref 79–97)
Platelets: 243 10*3/uL (ref 150–450)
RBC: 4.59 x10E6/uL (ref 4.14–5.80)
RDW: 11.9 % (ref 11.6–15.4)
WBC: 10.3 10*3/uL (ref 3.4–10.8)

## 2022-09-26 LAB — TSH: TSH: 1.08 u[IU]/mL (ref 0.450–4.500)

## 2022-09-26 LAB — T4, FREE: Free T4: 1.4 ng/dL (ref 0.82–1.77)

## 2022-09-26 LAB — MAGNESIUM: Magnesium: 1.8 mg/dL (ref 1.6–2.3)

## 2022-09-28 ENCOUNTER — Other Ambulatory Visit: Payer: Self-pay | Admitting: Student

## 2022-09-29 ENCOUNTER — Ambulatory Visit: Payer: Self-pay | Attending: Nurse Practitioner | Admitting: Nurse Practitioner

## 2022-09-29 ENCOUNTER — Encounter: Payer: Self-pay | Admitting: Nurse Practitioner

## 2022-09-29 VITALS — BP 111/69 | HR 69 | Ht 64.0 in | Wt 156.6 lb

## 2022-09-29 DIAGNOSIS — I700 Atherosclerosis of aorta: Secondary | ICD-10-CM

## 2022-09-29 DIAGNOSIS — I2510 Atherosclerotic heart disease of native coronary artery without angina pectoris: Secondary | ICD-10-CM

## 2022-09-29 DIAGNOSIS — I10 Essential (primary) hypertension: Secondary | ICD-10-CM

## 2022-09-29 DIAGNOSIS — I428 Other cardiomyopathies: Secondary | ICD-10-CM

## 2022-09-29 MED ORDER — EPLERENONE 25 MG PO TABS
25.0000 mg | ORAL_TABLET | Freq: Every day | ORAL | 0 refills | Status: DC
Start: 2022-09-29 — End: 2023-01-09

## 2022-09-29 MED ORDER — AMIODARONE HCL 200 MG PO TABS: 200.0000 mg | ORAL_TABLET | Freq: Every day | ORAL | 0 refills | Status: DC

## 2022-09-29 MED ORDER — LOSARTAN POTASSIUM 25 MG PO TABS
25.0000 mg | ORAL_TABLET | Freq: Every day | ORAL | 3 refills | Status: AC
Start: 2022-09-29 — End: ?

## 2022-09-29 MED ORDER — DIGOXIN 125 MCG PO TABS
0.0625 mg | ORAL_TABLET | Freq: Every day | ORAL | 0 refills | Status: AC
Start: 2022-09-29 — End: ?

## 2022-09-29 MED ORDER — FUROSEMIDE 80 MG PO TABS: ORAL_TABLET | ORAL | 0 refills | Status: DC

## 2022-09-29 MED ORDER — EMPAGLIFLOZIN 10 MG PO TABS
10.0000 mg | ORAL_TABLET | Freq: Every day | ORAL | 0 refills | Status: DC
Start: 2022-09-29 — End: 2023-01-09

## 2022-09-29 MED ORDER — NITROGLYCERIN 0.4 MG SL SUBL
0.4000 mg | SUBLINGUAL_TABLET | SUBLINGUAL | 6 refills | Status: DC | PRN
Start: 2022-09-29 — End: 2023-06-25

## 2022-09-29 NOTE — Progress Notes (Signed)
Assessment & Plan:  Stanley [name] was seen today for medical management of chronic issues.  Diagnoses and all orders for this visit:  Primary hypertension -     losartan (COZAAR) 25 MG tablet; Take 1 tablet (25 mg total) by mouth daily. Continue all antihypertensives as prescribed.  Reminded to bring in blood pressure log for follow  up appointment.  RECOMMENDATIONS: DASH/Mediterranean Diets are healthier choices for HTN.    Aortic atherosclerosis (HCC) -     empagliflozin (JARDIANCE) 10 MG TABS tablet; Take 1 tablet (10 mg total) by mouth daily.  Coronary artery disease involving native coronary artery of native heart without angina pectoris Follow up with Cardiology next month -     empagliflozin (JARDIANCE) 10 MG TABS tablet; Take 1 tablet (10 mg total) by mouth daily. -     nitroGLYCERIN (NITROSTAT) 0.4 MG SL tablet; Place 1 tablet (0.4 mg total) under the tongue every 5 (five) minutes for 3 doses as needed for chest pain.  Nonischemic cardiomyopathy (HCC) -     amiodarone (PACERONE) 200 MG tablet; Take 1 tablet (200 mg total) by mouth daily. -     digoxin (LANOXIN) 0.125 MG tablet; Take 0.5 tablets (0.0625 mg total) by mouth daily. -     eplerenone (INSPRA) 25 MG tablet; Take 1 tablet (25 mg total) by mouth daily. -     furosemide (LASIX) 80 MG tablet; MAY TAKE 1 TABLET AS NEEDED FOR SWELLING -     nitroGLYCERIN (NITROSTAT) 0.4 MG SL tablet; Place 1 tablet (0.4 mg total) under the tongue every 5 (five) minutes for 3 doses as needed for chest pain.    Patient has been counseled on age-appropriate routine health concerns for screening and prevention. These are reviewed and up-to-date. Referrals have been placed accordingly. Immunizations are up-to-date or declined.    Subjective:   Chief Complaint  Patient presents with   Medical Management of Chronic Issues   HPI Stanley [name] 57 y.o. male presents to office today fro follow up to HTN  He has a past medical history of  Acute renal failure, Alcohol abuse, Anxiety, CAD, Chronic systolic CHF, Depression, Financial difficulties, Gout, History of noncompliance with medical treatment, HTN,  ICD, Insomnia, Lobar pneumonia (12/28/2018), MI (2005), Sleep apnea, and Ventricular tachycardia (01/2015).    He is followed by Cardiology for NICM  Blood pressure is well controlled today. Adherent with all medications. Doing well today. Trying to abstain from alcohol.  BP Readings from Last 3 Encounters:  09/29/22 111/69  09/25/22 126/74  08/24/22 118/68    Depression still mild to moderate. Has declined SSRI in the past. Taking trazodone for insomnia.   Review of Systems  Constitutional:  Negative for fever, malaise/fatigue and weight loss.  HENT: Negative.  Negative for nosebleeds.   Eyes: Negative.  Negative for blurred vision, double vision and photophobia.  Respiratory: Negative.  Negative for cough and shortness of breath.   Cardiovascular: Negative.  Negative for chest pain, palpitations and leg swelling.  Gastrointestinal: Negative.  Negative for heartburn, nausea and vomiting.  Musculoskeletal: Negative.  Negative for myalgias.  Neurological: Negative.  Negative for dizziness, focal weakness, seizures and headaches.  Psychiatric/Behavioral: Negative.  Negative for suicidal ideas.     Past Medical History:  Diagnosis Date   Acute renal failure (ARF) (HCC)    Acute respiratory failure with hypoxia (HCC) 12/29/2018   Alcohol abuse    Anxiety    CAD (coronary artery disease)    a. dx unclear, reported  PCI in 2011 but cath 2014 normal coronaries and cardiac CT 07/2016 normal coronaries, no mention of stents.   Chronic chest pain    Chronic systolic CHF (congestive heart failure) (HCC)    Depression    Dyspnea    07/05/2019: per patient some times with exertion, "has to catch breath"   Family history of breast cancer 05/30/2021   Family history of pancreatic cancer 05/30/2021   Financial difficulties    Gout     Gouty arthritis    Headache    History of noncompliance with medical treatment    financial challenges   Hypertension    ICD (implantable cardioverter-defibrillator) in place    [employer] ICD implant done in [place] 2015   Insomnia    Lobar pneumonia (HCC) 12/28/2018   Myocardial infarction (HCC) 2005   Nonischemic cardiomyopathy (HCC)    Sleep apnea    per patient, has CPAP does not wear it every night   Ventricular tachycardia (HCC) 01/2015   2017 - ATP delivered for sinus tach, degenerated into VT for which he received shock. Recurrent VT 03/2018.    Past Surgical History:  Procedure Laterality Date   BAROREFLEX SYSTEM INSERTION     BIOPSY  03/06/2021   Procedure: BIOPSY;  Surgeon: Benancio Deeds, MD;  Location: [facility];  Service: Gastroenterology;;   CARDIAC DEFIBRILLATOR PLACEMENT  06/05/2013   [employer] Inogen ICD implanted at [facility] in [place] for primary prevention   COLONOSCOPY WITH PROPOFOL N/A 03/06/2021   Procedure: COLONOSCOPY WITH PROPOFOL;  Surgeon: Benancio Deeds, MD;  Location: [facility];  Service: Gastroenterology;  Laterality: N/A;   ESOPHAGEAL DILATION  03/06/2021   Procedure: ESOPHAGEAL DILATION;  Surgeon: Benancio Deeds, MD;  Location: [facility];  Service: Gastroenterology;;   ESOPHAGOGASTRODUODENOSCOPY (EGD) WITH PROPOFOL N/A 03/06/2021   Procedure: ESOPHAGOGASTRODUODENOSCOPY (EGD) WITH PROPOFOL;  Surgeon: Benancio Deeds, MD;  Location: [facility];  Service: Gastroenterology;  Laterality: N/A;   ESOPHAGOGASTRODUODENOSCOPY (EGD) WITH PROPOFOL N/A 05/18/2021   Procedure: ESOPHAGOGASTRODUODENOSCOPY (EGD) WITH PROPOFOL;  Surgeon: Sherrilyn Rist, MD;  Location: [facility] ENDOSCOPY;  Service: Gastroenterology;  Laterality: N/A;   HERNIA REPAIR     PERCUTANEOUS CORONARY STENT INTERVENTION (PCI-S)  01/05/2009   POLYPECTOMY  03/06/2021   Procedure: POLYPECTOMY;  Surgeon: Benancio Deeds, MD;  Location: [facility];   Service: Gastroenterology;;   RIGHT HEART CATH N/A 06/11/2022   Procedure: RIGHT HEART CATH;  Surgeon: Laurey Morale, MD;  Location: [facility] INVASIVE CV LAB;  Service: Cardiovascular;  Laterality: N/A;    Family History  Problem Relation Age of Onset   Breast cancer Mother    Pancreatic cancer Mother    Hypertension Father    Alzheimer's disease Father    Gout Father    Dementia Father    Hypertension Sister    Clotting disorder Sister    Obesity Brother    Bronchitis Brother    Cancer Maternal Uncle        unknown type; dx after 66; x2 maternal uncles   Breast cancer Paternal Aunt        dx after 34   Lung cancer Paternal Uncle    Heart disease Maternal Grandmother    Gout Paternal Grandfather    Colon polyps Neg Hx    Colon cancer Neg Hx    Esophageal cancer Neg Hx    Stomach cancer Neg Hx     Social History Reviewed with no changes to be made today.  Outpatient Medications Prior to Visit  Medication Sig Dispense Refill   allopurinol (ZYLOPRIM) 300 MG tablet Take 1 tablet (300 mg total) by mouth daily. 30 tablet 6   aspirin EC 81 MG tablet Take 81 mg by mouth daily. Swallow whole.     atorvastatin (LIPITOR) 20 MG tablet Take 1 tablet (20 mg total) by mouth daily. 90 tablet 1   traZODone (DESYREL) 150 MG tablet TAKE 1 TABLET AT BEDTIME 90 tablet 3   amiodarone (PACERONE) 200 MG tablet Take 1 tablet (200 mg total) by mouth daily. 90 tablet 1   digoxin (LANOXIN) 0.125 MG tablet Take 0.5 tablets (0.0625 mg total) by mouth daily. 45 tablet 1   empagliflozin (JARDIANCE) 10 MG TABS tablet Take 1 tablet (10 mg total) by mouth daily. 90 tablet 1   eplerenone (INSPRA) 25 MG tablet Take 1 tablet (25 mg total) by mouth daily. 90 tablet 3   furosemide (LASIX) 80 MG tablet MAY TAKE 1 TABLET AS NEEDED FOR SWELLING 90 tablet 5   losartan (COZAAR) 25 MG tablet Take 1 tablet (25 mg total) by mouth daily. 90 tablet 3   nitroGLYCERIN (NITROSTAT) 0.4 MG SL tablet Place 1 tablet (0.4 mg total)  under the tongue every 5 (five) minutes for 3 doses as needed for chest pain. 25 tablet 6   No facility-administered medications prior to visit.    Allergies  Allergen Reactions   Lisinopril Shortness Of Breath    Makes lips swell   Allopurinol Nausea Only    dizziness   Entresto [Sacubitril-Valsartan]     History of angioedema   Other Swelling    Nuts - Lip Swelling   Shellfish Allergy Swelling    Lip Swelling   Spironolactone     Breast tenderness       Objective:    BP 111/69 (BP Location: Left Arm, Patient Position: Sitting, Cuff Size: Normal)   Pulse 69   Ht 5\' 4"  (1.626 m)   Wt 156 lb 9.6 oz (71 kg)   SpO2 98%   BMI 26.88 kg/m  Wt Readings from Last 3 Encounters:  09/29/22 156 lb 9.6 oz (71 kg)  09/25/22 157 lb 3.2 oz (71.3 kg)  08/24/22 153 lb 12.8 oz (69.8 kg)    Physical Exam Vitals and nursing note reviewed.  Constitutional:      Appearance: He is well-developed.  HENT:     Head: Normocephalic and atraumatic.  Cardiovascular:     Rate and Rhythm: Normal rate and regular rhythm.     Heart sounds: Normal heart sounds. No murmur heard.    No friction rub. No gallop.  Pulmonary:     Effort: Pulmonary effort is normal. No tachypnea or respiratory distress.     Breath sounds: Normal breath sounds. No decreased breath sounds, wheezing, rhonchi or rales.  Chest:     Chest wall: No tenderness.  Abdominal:     General: Bowel sounds are normal.     Palpations: Abdomen is soft.  Musculoskeletal:        General: Normal range of motion.     Cervical back: Normal range of motion.  Skin:    General: Skin is warm and dry.  Neurological:     Mental Status: He is alert and oriented to person, place, and time.     Coordination: Coordination normal.  Psychiatric:        Behavior: Behavior normal. Behavior is cooperative.        Thought Content: Thought content normal.  Judgment: Judgment normal.          Patient has been counseled extensively about  nutrition and exercise as well as the importance of adherence with medications and regular follow-up. The patient was given clear instructions to go to ER or return to medical center if symptoms don't improve, worsen or new problems develop. The patient verbalized understanding.   Follow-up: Return in about 3 months (around 12/29/2022).   [name], FNP-BC [facility] [place], [place] [phone]   09/29/2022, 10:45 PM

## 2022-09-29 NOTE — Progress Notes (Signed)
Remote ICD transmission.   

## 2022-10-07 ENCOUNTER — Telehealth (HOSPITAL_COMMUNITY): Payer: Self-pay | Admitting: Licensed Clinical Social Worker

## 2022-10-07 NOTE — Telephone Encounter (Signed)
H&V Care Navigation CSW Progress Note  Clinical Social Worker contacted patient by phone to assist with transportation..  Patient is participating in a Managed Medicaid Plan:  No  Patient requesting transportation to tomorrow's appointment. CSW made arrangements with Rolan Bucco and waiver reviewed. Lasandra Beech, LCSW, CCSW-MCS [phone]   SDOH Screenings   Food Insecurity: Food Insecurity Present (07/14/2022)  Housing: Patient Declined (06/24/2022)  Transportation Needs: Unmet Transportation Needs (10/07/2022)  Utilities: Not At Risk (06/11/2022)  Alcohol Screen: Low Risk  (02/07/2019)  Depression (PHQ2-9): High Risk (09/29/2022)  Financial Resource Strain: High Risk (06/30/2022)  Physical Activity: Unknown (03/21/2018)  Social Connections: Unknown (03/21/2018)  Stress: Stress Concern Present (03/21/2018)  Tobacco Use: Low Risk  (09/29/2022)   10/07/2022  Darlina Rumpf DOB: 1965-09-22 MRN: [id number]   RIDER WAIVER AND RELEASE OF LIABILITY  For the purposes of helping with transportation needs, [facility] partners with outside transportation providers (taxi companies, [place], Catering manager.) to give [employer] patients or other approved people the choice of on-demand rides [employer]") to our buildings for non-emergency visits.  By using [employer], I, the person signing this document, on behalf of myself and/or any legal minors (in my care using the [employer]), agree:  Science writer given to me are supplied by independent, outside transportation providers who do not work for, or have any affiliation with, [employer]. [place] is not a transportation company. [facility] has no control over the quality or safety of the rides I get using [employer]. [facility] has no control over whether any outside ride will happen on time or not. [facility] gives no guarantee on the reliability, quality, safety, or availability on any rides, or that no mistakes will  happen. I know and accept that traveling by vehicle (car, truck, SVU, Zenaida Niece, bus, taxi, etc.) has risks of serious injuries such as disability, being paralyzed, and death. I know and agree the risk of using [employer] is mine alone, and not [employer]. Transport Services are provided "as is" and as are available. The transportation providers are in charge for all inspections and care of the vehicles used to provide these rides. I agree not to take legal action against [facility], its agents, employees, officers, directors, representatives, insurers, attorneys, assigns, successors, subsidiaries, and affiliates at any time for any reasons related directly or indirectly to using [employer]. I also agree not to take legal action against [facility] or its affiliates for any injury, death, or damage to property caused by or related to using [employer]. I have read this Waiver and Release of Liability, and I understand the terms used in it and their legal meaning. This Waiver is freely and voluntarily given with the understanding that my right (or any legal minors) to legal action against [facility] relating to [employer] is knowingly given up to use these services.   I attest that I read the Ride Waiver and Release of Liability to Darlina Rumpf, gave Mr. Halt the opportunity to ask questions and answered the questions asked (if any). I affirm that Nichole Keltner then provided consent for assistance with transportation.     Marcy Siren

## 2022-10-07 NOTE — Progress Notes (Signed)
Remote ICD transmission.   

## 2022-10-08 ENCOUNTER — Encounter (HOSPITAL_COMMUNITY): Payer: Self-pay | Admitting: Internal Medicine

## 2022-10-08 ENCOUNTER — Ambulatory Visit (HOSPITAL_BASED_OUTPATIENT_CLINIC_OR_DEPARTMENT_OTHER)
Admission: RE | Admit: 2022-10-08 | Discharge: 2022-10-08 | Disposition: A | Discharge status: Home / Self Care | Payer: Self-pay | Source: Ambulatory Visit | Attending: Internal Medicine | Admitting: Internal Medicine

## 2022-10-08 ENCOUNTER — Ambulatory Visit (HOSPITAL_COMMUNITY)
Admission: RE | Admit: 2022-10-08 | Discharge: 2022-10-08 | Disposition: A | Discharge status: Home / Self Care | Payer: Self-pay | Source: Ambulatory Visit | Attending: Internal Medicine | Admitting: Internal Medicine

## 2022-10-08 VITALS — BP 120/70 | HR 55 | Wt 160.8 lb

## 2022-10-08 DIAGNOSIS — R0789 Other chest pain: Secondary | ICD-10-CM | POA: Insufficient documentation

## 2022-10-08 DIAGNOSIS — I10 Essential (primary) hypertension: Secondary | ICD-10-CM

## 2022-10-08 DIAGNOSIS — I130 Hypertensive heart and chronic kidney disease with heart failure and stage 1 through stage 4 chronic kidney disease, or unspecified chronic kidney disease: Secondary | ICD-10-CM | POA: Insufficient documentation

## 2022-10-08 DIAGNOSIS — Z5986 Financial insecurity: Secondary | ICD-10-CM | POA: Insufficient documentation

## 2022-10-08 DIAGNOSIS — I5022 Chronic systolic (congestive) heart failure: Secondary | ICD-10-CM

## 2022-10-08 DIAGNOSIS — I472 Ventricular tachycardia, unspecified: Secondary | ICD-10-CM

## 2022-10-08 DIAGNOSIS — Z5982 Transportation insecurity: Secondary | ICD-10-CM | POA: Insufficient documentation

## 2022-10-08 DIAGNOSIS — Z7984 Long term (current) use of oral hypoglycemic drugs: Secondary | ICD-10-CM | POA: Insufficient documentation

## 2022-10-08 DIAGNOSIS — Z4502 Encounter for adjustment and management of automatic implantable cardiac defibrillator: Secondary | ICD-10-CM | POA: Insufficient documentation

## 2022-10-08 DIAGNOSIS — I340 Nonrheumatic mitral (valve) insufficiency: Secondary | ICD-10-CM | POA: Insufficient documentation

## 2022-10-08 DIAGNOSIS — G4733 Obstructive sleep apnea (adult) (pediatric): Secondary | ICD-10-CM

## 2022-10-08 DIAGNOSIS — I428 Other cardiomyopathies: Secondary | ICD-10-CM | POA: Insufficient documentation

## 2022-10-08 DIAGNOSIS — Z955 Presence of coronary angioplasty implant and graft: Secondary | ICD-10-CM | POA: Insufficient documentation

## 2022-10-08 DIAGNOSIS — G8929 Other chronic pain: Secondary | ICD-10-CM | POA: Insufficient documentation

## 2022-10-08 DIAGNOSIS — N183 Chronic kidney disease, stage 3 unspecified: Secondary | ICD-10-CM | POA: Insufficient documentation

## 2022-10-08 DIAGNOSIS — N1831 Chronic kidney disease, stage 3a: Secondary | ICD-10-CM

## 2022-10-08 DIAGNOSIS — F1010 Alcohol abuse, uncomplicated: Secondary | ICD-10-CM

## 2022-10-08 DIAGNOSIS — Z79899 Other long term (current) drug therapy: Secondary | ICD-10-CM | POA: Insufficient documentation

## 2022-10-08 DIAGNOSIS — M10372 Gout due to renal impairment, left ankle and foot: Secondary | ICD-10-CM | POA: Insufficient documentation

## 2022-10-08 DIAGNOSIS — M25472 Effusion, left ankle: Secondary | ICD-10-CM | POA: Insufficient documentation

## 2022-10-08 DIAGNOSIS — Z7982 Long term (current) use of aspirin: Secondary | ICD-10-CM | POA: Insufficient documentation

## 2022-10-08 DIAGNOSIS — I519 Heart disease, unspecified: Secondary | ICD-10-CM

## 2022-10-08 DIAGNOSIS — I2510 Atherosclerotic heart disease of native coronary artery without angina pectoris: Secondary | ICD-10-CM | POA: Insufficient documentation

## 2022-10-08 LAB — BASIC METABOLIC PANEL
Anion gap: 8 (ref 5–15)
BUN: 21 mg/dL — ABNORMAL HIGH (ref 6–20)
CO2: 22 mmol/L (ref 22–32)
Calcium: 9.1 mg/dL (ref 8.9–10.3)
Chloride: 109 mmol/L (ref 98–111)
Creatinine, Ser: 1.33 mg/dL — ABNORMAL HIGH (ref 0.61–1.24)
GFR, Estimated: >60 mL/min (ref >60)
Glucose, Bld: 80 mg/dL (ref 70–99)
Potassium: 3.9 mmol/L (ref 3.5–5.1)
Sodium: 139 mmol/L (ref 135–145)

## 2022-10-08 LAB — ECHOCARDIOGRAM COMPLETE
Area-P 1/2: 3.83 cm2
Calc EF: 36.7 %
MV M vel: 5.12 m/s
MV Peak grad: 104.9 mm[Hg]
Radius: 0.6 cm
S' Lateral: 5.1 cm
Single Plane A2C EF: 38.9 %
Single Plane A4C EF: 34.4 %

## 2022-10-08 LAB — URIC ACID: Uric Acid, Serum: 2.2 mg/dL — ABNORMAL LOW (ref 3.7–8.6)

## 2022-10-08 LAB — BRAIN NATRIURETIC PEPTIDE: B Natriuretic Peptide: 63.8 pg/mL (ref 0.0–100.0)

## 2022-10-08 MED ORDER — PREDNISONE 20 MG PO TABS: 40.0000 mg | ORAL_TABLET | Freq: Every day | ORAL | 0 refills | Status: AC

## 2022-10-08 MED ORDER — LOSARTAN POTASSIUM 50 MG PO TABS: 50.0000 mg | ORAL_TABLET | Freq: Every day | ORAL | 6 refills | Status: DC

## 2022-10-08 NOTE — Progress Notes (Signed)
ADVANCED HF CLINIC NOTE  PCP: [name], NP EP: Dr. Lalla Brothers HF Cardiologist: Dr. Gala Romney  HPI:  Stanley [name] is a 57 y.o. male with systolic HF due to NICM d/x'd in 2006 (felt 2/2 HTN/ETOH), ? CAD s/p PCI to unknown vessel 2011 at outside location (cath 2014 without CAD and normal coronaries by CT 07/2016), VT s/p [facility] ICD implant ([place] 2015), noncompliance, chronic noncardiac chest pain, depression, HTN and ETOH abuse.    In 2017 he received inappropriate therapy for ST with ATP (after a work out at [employer] and reported noncompliance with meds) that degenerated into VT and received several shocks. He has had issues with chronic chest pain without evidence for ischemic etiology including normal cath and cardiac CT as above. This is relatively unchanged. Echo 03/2016  EF 35-40%, grade 1 DD, mild MR, moderate LAE  Had recurrent ICD shock in 3/20 in setting of medicine noncompliance. Amio continued. Echo 3/20 EF 25-30% Normal RV.   Echo 3/21: EF 25-30% RV ok S/p Barostim 7/21 Myoview 5/22: EF 34% fixed inferior defect  Admitted 6/24 with recurrent VT. 2/2 intoxication and metabolic derangement (K 3.1, Mag 1.2). Started on amiodarone gtt. Echo showed EF < 20%, RV mildly reduced. Required pressors and ICU monitoring due to worsening respiratory failure and ETOH withdrawal. Underwent RHC showing elevated RA pressures, normal PCWP, mild to moderate PAH and decreased output with CI (Thermo) 1.7. DBA gtt started. Drips weaned and amio transitioned to oral. GDMT titrated, but limited by AKI. He was discharged home, weight 136 lbs.  Today he returns for HF follow up. Overall feeling pretty good. Good days and bad days. Says he has been struggling with gout with flares every 2-3 months despite allopurinol. No CP. Mild DOE. No edema, orthopnea or PND. Compliant with meds. Stopped drinking liquor and beer completely. Still drinks wine   Echo today 10/08/22 EF 30-35% RV ok. Mild to  moderate MR Personally reviewed   ICD interrogation: No VT. Activity 2.6hr/day Personally reviewed   Cardiac Studies - RHC (6/24): RA mean 10, PA 50/23 (mean 34), PCWP mean 11, CO/CI (Fick) 3.87/2.25, CO/CI (thermo) 2.92/1.78,  PVR 5.9  WU, PAPi 2.7  - Echo (6/24): EF < 20%, grade I DD, RV mildly reduced, moderate MR, moderate to severe TR  - Myoview (5/22): EF 34% fixed inferior defect  - S/p Barostim (7/21)  - Echo (3/21): EF 25-30% RV ok  - CPX (09/19/18) FVC 1.96 (57%)      FEV1 1.60 (58%)        FEV1/FVC 81 (101%)        MVV 80 (61%)  Resting HR: 77 Peak HR: 131   (79% age predicted max HR)  BP rest: 168/80          Standing BP: 156/74 BP peak: 196/80   Peak VO2: 14.9 (52% predicted peak VO2)  VE/VCO2 slope:  31  OUES: 1.43  Peak RER: 1.05  VE/MVV:  50% O2pulse:  10   (67% predicted O2pulse)  Moderate to severe HF limitation but normal VeVCO2 slope is reassuring. Restrictive lung physiology.   - Echo (3/20) EF 25-30% Normal RV.   - Echo (3/18):  EF 35-40%, grade 1 DD, mild MR, moderate LAE  Past Medical History:  Diagnosis Date   Acute renal failure (ARF) (HCC)    Acute respiratory failure with hypoxia (HCC) 12/29/2018   Alcohol abuse    Anxiety    CAD (coronary artery disease)  a. dx unclear, reported PCI in 2011 but cath 2014 normal coronaries and cardiac CT 07/2016 normal coronaries, no mention of stents.   Chronic chest pain    Chronic systolic CHF (congestive heart failure) (HCC)    Depression    Dyspnea    07/05/2019: per patient some times with exertion, "has to catch breath"   Family history of breast cancer 05/30/2021   Family history of pancreatic cancer 05/30/2021   Financial difficulties    Gout    Gouty arthritis    Headache    History of noncompliance with medical treatment    financial challenges   Hypertension    ICD (implantable cardioverter-defibrillator) in place    [facility] ICD implant done in [place] 2015   Insomnia    Lobar pneumonia  (HCC) 12/28/2018   Myocardial infarction (HCC) 2005   Nonischemic cardiomyopathy (HCC)    Sleep apnea    per patient, has CPAP does not wear it every night   Ventricular tachycardia (HCC) 01/2015   2017 - ATP delivered for sinus tach, degenerated into VT for which he received shock. Recurrent VT 03/2018.   Current Outpatient Medications  Medication Sig Dispense Refill   allopurinol (ZYLOPRIM) 300 MG tablet Take 1 tablet (300 mg total) by mouth daily. 30 tablet 6   amiodarone (PACERONE) 200 MG tablet Take 1 tablet (200 mg total) by mouth daily. 90 tablet 0   aspirin EC 81 MG tablet Take 81 mg by mouth daily. Swallow whole.     atorvastatin (LIPITOR) 20 MG tablet Take 1 tablet (20 mg total) by mouth daily. 90 tablet 1   digoxin (LANOXIN) 0.125 MG tablet Take 0.5 tablets (0.0625 mg total) by mouth daily. 45 tablet 0   empagliflozin (JARDIANCE) 10 MG TABS tablet Take 1 tablet (10 mg total) by mouth daily. 90 tablet 0   eplerenone (INSPRA) 25 MG tablet Take 1 tablet (25 mg total) by mouth daily. 90 tablet 0   furosemide (LASIX) 80 MG tablet MAY TAKE 1 TABLET AS NEEDED FOR SWELLING 90 tablet 0   losartan (COZAAR) 25 MG tablet Take 1 tablet (25 mg total) by mouth daily. 90 tablet 3   nitroGLYCERIN (NITROSTAT) 0.4 MG SL tablet Place 1 tablet (0.4 mg total) under the tongue every 5 (five) minutes for 3 doses as needed for chest pain. 75 each 6   traZODone (DESYREL) 150 MG tablet TAKE 1 TABLET AT BEDTIME 90 tablet 3   No current facility-administered medications for this encounter.   Allergies  Allergen Reactions   Lisinopril Shortness Of Breath    Makes lips swell   Allopurinol Nausea Only    dizziness   Entresto [Sacubitril-Valsartan]     History of angioedema   Other Swelling    Nuts - Lip Swelling   Shellfish Allergy Swelling    Lip Swelling   Spironolactone     Breast tenderness   Social History   Socioeconomic History   Marital status: Divorced    Spouse name: Not on file    Number of children: 1   Years of education: 12   Highest education level: Not on file  Occupational History   Occupation: Disability  Tobacco Use   Smoking status: Never   Smokeless tobacco: Never  Vaping Use   Vaping status: Never Used  Substance and Sexual Activity   Alcohol use: Not Currently    Comment: Last drink before 06/08/2022   Drug use: No   Sexual activity: Not Currently  Other  Topics Concern   Not on file  Social History Narrative   Lives in [place] alone.  Disabled.  Previously worked as a Geophysicist/field seismologist.   Fun: Rest, Read and watch movies.    Social Determinants of Health   Financial Resource Strain: High Risk (06/30/2022)   Overall Financial Resource Strain (CARDIA)    Difficulty of Paying Living Expenses: Hard  Food Insecurity: Food Insecurity Present (07/14/2022)   Hunger Vital Sign    Worried About Running Out of Food in the Last Year: Sometimes true    Ran Out of Food in the Last Year: Sometimes true  Transportation Needs: Unmet Transportation Needs (10/07/2022)   PRAPARE - Administrator, Civil Service (Medical): Yes    Lack of Transportation (Non-Medical): Yes  Physical Activity: Unknown (03/21/2018)   Exercise Vital Sign    Days of Exercise per Week: Patient declined    Minutes of Exercise per Session: Patient declined  Stress: Stress Concern Present (03/21/2018)   [employer] of Occupational Health - Occupational Stress Questionnaire    Feeling of Stress : To some extent  Social Connections: Unknown (03/21/2018)   Social Connection and Isolation Panel [NHANES]    Frequency of Communication with Friends and Family: Patient declined    Frequency of Social Gatherings with Friends and Family: Patient declined    Attends Religious Services: Patient declined    Database administrator or Organizations: Patient declined    Attends Banker Meetings: Patient declined    Marital Status: Patient declined  Intimate Partner  Violence: Not At Risk (06/11/2022)   Humiliation, Afraid, Rape, and Kick questionnaire    Fear of Current or Ex-Partner: No    Emotionally Abused: No    Physically Abused: No    Sexually Abused: No   Family History  Problem Relation Age of Onset   Breast cancer Mother    Pancreatic cancer Mother    Hypertension Father    Alzheimer's disease Father    Gout Father    Dementia Father    Hypertension Sister    Clotting disorder Sister    Obesity Brother    Bronchitis Brother    Cancer Maternal Uncle        unknown type; dx after 47; x2 maternal uncles   Breast cancer Paternal Aunt        dx after 50   Lung cancer Paternal Uncle    Heart disease Maternal Grandmother    Gout Paternal Grandfather    Colon polyps Neg Hx    Colon cancer Neg Hx    Esophageal cancer Neg Hx    Stomach cancer Neg Hx    BP 120/70   Pulse (!) 55   Wt 72.9 kg (160 lb 12.8 oz)   SpO2 97%   BMI 27.60 kg/m   Wt Readings from Last 3 Encounters:  10/08/22 72.9 kg (160 lb 12.8 oz)  09/29/22 71 kg (156 lb 9.6 oz)  09/25/22 71.3 kg (157 lb 3.2 oz)   PHYSICAL EXAM: General:  Well appearing. No resp difficulty HEENT: normal Neck: supple. no JVD. Carotids 2+ bilat; no bruits. No lymphadenopathy or thryomegaly appreciated. Cor: PMI nondisplaced. Regular rate & rhythm. 2/6 SEM Lungs: clear Abdomen: soft, nontender, nondistended. No hepatosplenomegaly. No bruits or masses. Good bowel sounds. Extremities: no cyanosis, clubbing, rash, edema L ankle tender to touch painful ROM  Neuro: alert & orientedx3, cranial nerves grossly intact. moves all 4 extremities w/o difficulty. Affect pleasant   Device interrogation (  personally reviewed): No VT. Activity 2.6hr/day Personally reviewed   ASSESSMENT & PLAN:  1. VT  - Shocks from [employer] ICD (06/08/22), 2/2 hypomag and hypokalemia, ETOH abuse and medication noncompliance..  - Continue amiodarone per EP - No recurrent VT  - No driving x 6 months from event  (12/08/22) - Device interrogation as above  2. Chronic systolic CHF - Mostly NICM (? H/o PCI to unknown vessel in 2011.) CTA 2018 normal cors - s/p [employer] ICD - Echo (3/20): EF 25-30% - CPX 9/20 moderate to severe HF limitation. pVO2: 14.9 (52% predicted peak VO2).  slope: 31  pRER: 1.05  - Echo (03/10/19): EF 25-30% RV ok Mild MR - Myoview (5/22): EF 34% fixed inferior defect - Echo (6/24): EF < 20%, RV mildly reduced (in setting of VT), moderate-severe TR, moderate MR.  - RHC (6/24): RA 10, PCWP 11, CO/CI (Thermo) 2.9/1.78. >>started on DBA - CMP likely 2/2 ETOH -> stopped drinking in 6/24  - Echo today 10/08/22 EF 30-35%  - Stable NYHA I. Volume looks good today. - Increase losartan to 25 mg daily (no ARNi/ACE w/ angioedema with Entresto and lisinopril). - Continue eplerenone 25 mg daily. - Continue Lasix 40 mg PRN - Continue Jardiance 10 mg daily. - Stop digoxin  - Labs today   3. ETOH abuse - Heavy ETOH use, likely contributes to cardiomyopathy.  - has cut way back since d/c. Congratulated him   4. CKD 3  - Baseline SCr 1.3-1.6 - Continue SGLT2i - stable  5. HTN - Blood pressure well controlled. Continue current regimen.  6. CAD - h/o single vessel PCI - Myoview (5/22): EF 34% fixed inferior defect - No s/s angina - Continue ASA - Continue statin  7. OSA - Unable to tolerate CPAP  8. Acute on chronic gout - left ankle is inflammed despite allopurinol - treat with prednisone 40mg  x 3 days. - refer to Dr. Nolberto Hanlon, MD  10:48 AM

## 2022-10-08 NOTE — Patient Instructions (Signed)
Medication Changes:  STOP Digoxin  Increase Losartan to 50 mg Daily  Take Prednisone 40 mg (2 tabs) Daily for 3 days  Lab Work:  Labs done today, your results will be available in MyChart, we will contact you for abnormal readings.  Testing/Procedures:  none  Referrals:  You have been referred to Dr Dierdre Forth at rheumatology for gout, they will call you to schedule an appointment  Special Instructions // Education:  Do the following things EVERYDAY: Weigh yourself in the morning before breakfast. Write it down and keep it in a log. Take your medicines as prescribed Eat low salt foods--Limit salt (sodium) to 2000 mg per day.  Stay as active as you can everyday Limit all fluids for the day to less than 2 liters   Follow-Up in: 3 months   At the [facility], you and your health needs are our priority. We have a designated team specialized in the treatment of Heart Failure. This Care Team includes your primary Heart Failure Specialized Cardiologist (physician), Advanced Practice Providers (APPs- Physician Assistants and Nurse Practitioners), and Pharmacist who all work together to provide you with the care you need, when you need it.   You may see any of the following providers on your designated Care Team at your next follow up:  Dr. Arvilla Meres Dr. Marca Ancona Dr. Dorthula Nettles Dr. Theresia Bough Tonye Becket, NP Robbie Lis, [place] [facility] [place], [place] Brynda Peon, NP [place] Lee, NP Karle Plumber, PharmD   Please be sure to bring in all your medications bottles to every appointment.   Need to Contact us:  If you have any questions or concerns before your next appointment please send [place] a message through [place] or call our office at [phone].    TO LEAVE A MESSAGE FOR THE NURSE SELECT OPTION 2, PLEASE LEAVE A MESSAGE INCLUDING: YOUR NAME DATE OF BIRTH CALL BACK NUMBER REASON FOR CALL**this is important as we prioritize  the call backs  YOU WILL RECEIVE A CALL BACK THE SAME DAY AS LONG AS YOU CALL BEFORE 4:00 PM

## 2022-10-19 ENCOUNTER — Encounter: Payer: Self-pay | Admitting: Internal Medicine

## 2022-10-22 ENCOUNTER — Telehealth: Payer: Self-pay | Admitting: Hematology and Oncology

## 2022-10-22 DIAGNOSIS — D72828 Other elevated white blood cell count: Secondary | ICD-10-CM

## 2022-10-22 NOTE — Telephone Encounter (Signed)
Lab orders placed.  

## 2022-10-26 ENCOUNTER — Telehealth: Payer: Self-pay

## 2022-10-26 ENCOUNTER — Inpatient Hospital Stay: Payer: Self-pay | Attending: Hematology and Oncology

## 2022-10-26 ENCOUNTER — Inpatient Hospital Stay (HOSPITAL_BASED_OUTPATIENT_CLINIC_OR_DEPARTMENT_OTHER): Payer: Self-pay | Admitting: Hematology and Oncology

## 2022-10-26 VITALS — BP 127/72 | HR 77 | Temp 97.7°F | Resp 18 | Wt 158.6 lb

## 2022-10-26 DIAGNOSIS — Z803 Family history of malignant neoplasm of breast: Secondary | ICD-10-CM | POA: Insufficient documentation

## 2022-10-26 DIAGNOSIS — D72828 Other elevated white blood cell count: Secondary | ICD-10-CM

## 2022-10-26 DIAGNOSIS — Z801 Family history of malignant neoplasm of trachea, bronchus and lung: Secondary | ICD-10-CM | POA: Insufficient documentation

## 2022-10-26 DIAGNOSIS — D72829 Elevated white blood cell count, unspecified: Secondary | ICD-10-CM | POA: Insufficient documentation

## 2022-10-26 DIAGNOSIS — F32A Depression, unspecified: Secondary | ICD-10-CM | POA: Insufficient documentation

## 2022-10-26 LAB — CMP (CANCER CENTER ONLY)
ALT: 27 U/L (ref 0–44)
AST: 35 U/L (ref 15–41)
Albumin: 4.4 g/dL (ref 3.5–5.0)
Alkaline Phosphatase: 42 U/L (ref 38–126)
Anion gap: 8 (ref 5–15)
BUN: 45 mg/dL — ABNORMAL HIGH (ref 6–20)
CO2: 25 mmol/L (ref 22–32)
Calcium: 9.7 mg/dL (ref 8.9–10.3)
Chloride: 104 mmol/L (ref 98–111)
Creatinine: 1.95 mg/dL — ABNORMAL HIGH (ref 0.61–1.24)
GFR, Estimated: 39 mL/min — ABNORMAL LOW (ref >60)
Glucose, Bld: 103 mg/dL — ABNORMAL HIGH (ref 70–99)
Potassium: 3.8 mmol/L (ref 3.5–5.1)
Sodium: 137 mmol/L (ref 135–145)
Total Bilirubin: 0.9 mg/dL (ref 0.3–1.2)
Total Protein: 8.3 g/dL — ABNORMAL HIGH (ref 6.5–8.1)

## 2022-10-26 LAB — CBC WITH DIFFERENTIAL (CANCER CENTER ONLY)
Abs Immature Granulocytes: 0.05 10*3/uL (ref 0.00–0.07)
Basophils Absolute: 0.1 10*3/uL (ref 0.0–0.1)
Basophils Relative: 0 %
Eosinophils Absolute: 0.1 10*3/uL (ref 0.0–0.5)
Eosinophils Relative: 1 %
HCT: 45.1 % (ref 39.0–52.0)
Hemoglobin: 14.9 g/dL (ref 13.0–17.0)
Immature Granulocytes: 0 %
Lymphocytes Relative: 16 %
Lymphs Abs: 1.8 10*3/uL (ref 0.7–4.0)
MCH: 31.6 pg (ref 26.0–34.0)
MCHC: 33 g/dL (ref 30.0–36.0)
MCV: 95.6 fL (ref 80.0–100.0)
Monocytes Absolute: 0.8 10*3/uL (ref 0.1–1.0)
Monocytes Relative: 7 %
Neutro Abs: 8.4 10*3/uL — ABNORMAL HIGH (ref 1.7–7.7)
Neutrophils Relative %: 76 %
Platelet Count: 187 10*3/uL (ref 150–400)
RBC: 4.72 MIL/uL (ref 4.22–5.81)
RDW: 13.2 % (ref 11.5–15.5)
WBC Count: 11.2 10*3/uL — ABNORMAL HIGH (ref 4.0–10.5)
nRBC: 0 % (ref 0.0–0.2)

## 2022-10-26 NOTE — Progress Notes (Signed)
[facility] Cancer Follow up:    Stanley Rigg, NP [address] [place] [address]  DIAGNOSIS: leukocytosis  SUMMARY OF HEMATOLOGIC HISTORY: Evaluated by Dr. Al Pimple on 03/07/2020 for persistent leukocytosis   Peripheral blood flow cytometry, BCR/ABL, and JAK 2 were negative  Infectious work up negative (hepatitis and HIV)  Mom with pancreatic cancer, underwent genetic testing on 06/13/2021 Negative. The CancerNext-Expanded gene panel offered by [facility] and includes sequencing and rearrangement analysis for the following 77 genes: AIP, ALK, APC*, ATM*, AXIN2, BAP1, BARD1, BMPR1A, BRCA1*, BRCA2*, BRIP1*, CDC73, CDH1*, CDK4, CDKN1B, CDKN2A, CHEK2*, CTNNA1, DICER1, FH, FLCN, KIF1B, LZTR1, MAX, MEN1, MET, MLH1*, MSH2*, MSH3, MSH6*, MUTYH*, NF1*, NF2, NTHL1, PALB2*, PHOX2B, PMS2*, POT1, PRKAR1A, PTCH1, PTEN*, RAD51C*, RAD51D*, RB1, RET, SDHA, SDHAF2, SDHB, SDHC, SDHD, SMAD4, SMARCA4, SMARCB1, SMARCE1, STK11, SUFU, TMEM127, TP53*, TSC1, TSC2, and VHL (sequencing and deletion/duplication); EGFR, EGLN1, HOXB13, KIT, MITF, PDGFRA, POLD1, and POLE (sequencing only); EPCAM and GREM1 (deletion/duplication only). DNA and RNA analyses performed for * genes.   CURRENT THERAPY: observation  INTERVAL HISTORY: Stanley [name] 57 y.o. male returns for follow-up of his elevated white blood cell count.    The patient, with a history of heart disease, presents for follow-up after a heart attack in June. He reports significant improvement since the event, but still experiences some shortness of breath with activity. He has been adherent to his medication regimen, but admits to having stopped some medications due to depression. He has since resumed all medications and reports a positive change in attitude and perspective on life. He has also made lifestyle changes, including limiting alcohol consumption to a glass of wine with dinner on Sundays. He has a history of high white  blood cell count, which was near normal at his last visit.  Patient Active Problem List   Diagnosis Date Noted   Neutrophilic leukocytosis 07/31/2022   Alcoholic intoxication without complication (HCC) 06/11/2022   Cardiogenic shock (HCC) 06/10/2022   Acute hypoxemic respiratory failure (HCC) 06/09/2022   Acute on chronic combined systolic and diastolic CHF (congestive heart failure) (HCC) 06/09/2022   AICD discharge 06/08/2022   Abdominal pain 06/08/2022   Anxiety and depression 06/08/2022   Genetic testing 06/13/2021   Family history of breast cancer 05/30/2021   Family history of pancreatic cancer 05/30/2021   Diverticulosis of colon without hemorrhage    Hematemesis 05/18/2021   ABLA (acute blood loss anemia) 05/18/2021   Benign neoplasm of colon    Abdominal pain, epigastric    Early satiety    Dysphagia    Gastroesophageal reflux disease 04/23/2020   Colon cancer screening 04/23/2020   CAD (coronary artery disease) 07/07/2019   Congestive heart failure (CHF) (HCC) 07/07/2019   Moderate episode of recurrent major depressive disorder (HCC) 01/11/2019   Chronic systolic heart failure (HCC)    Obstructive sleep apnea 08/11/2018   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  MEDICAL HISTORY: Past Medical History:  Diagnosis Date   Acute renal failure (ARF) (HCC)    Acute respiratory failure with hypoxia (HCC) 12/29/2018  Alcohol abuse    Anxiety    CAD (coronary artery  disease)    a. dx unclear, reported PCI in 2011 but cath 2014 normal coronaries and cardiac CT 07/2016 normal coronaries, no mention of stents.   Chronic chest pain    Chronic systolic CHF (congestive heart failure) (HCC)    Depression    Dyspnea    07/05/2019: per patient some times with exertion, "has to catch breath"   Family history of breast cancer 05/30/2021   Family history of pancreatic cancer 05/30/2021   Financial difficulties    Gout    Gouty arthritis    Headache    History of noncompliance with medical treatment    financial challenges   Hypertension    ICD (implantable cardioverter-defibrillator) in place    [employer] ICD implant done in [place] 2015   Insomnia    Lobar pneumonia (HCC) 12/28/2018   Myocardial infarction (HCC) 2005   Nonischemic cardiomyopathy (HCC)    Sleep apnea    per patient, has CPAP does not wear it every night   Ventricular tachycardia (HCC) 01/2015   2017 - ATP delivered for sinus tach, degenerated into VT for which he received shock. Recurrent VT 03/2018.    SURGICAL HISTORY: Past Surgical History:  Procedure Laterality Date   BAROREFLEX SYSTEM INSERTION     BIOPSY  03/06/2021   Procedure: BIOPSY;  Surgeon: Benancio Deeds, MD;  Location: [facility];  Service: Gastroenterology;;   CARDIAC DEFIBRILLATOR PLACEMENT  06/05/2013   [employer] Inogen ICD implanted at [facility] in [place] for primary prevention   COLONOSCOPY WITH PROPOFOL N/A 03/06/2021   Procedure: COLONOSCOPY WITH PROPOFOL;  Surgeon: Benancio Deeds, MD;  Location: [facility];  Service: Gastroenterology;  Laterality: N/A;   ESOPHAGEAL DILATION  03/06/2021   Procedure: ESOPHAGEAL DILATION;  Surgeon: Benancio Deeds, MD;  Location: [facility];  Service: Gastroenterology;;   ESOPHAGOGASTRODUODENOSCOPY (EGD) WITH PROPOFOL N/A 03/06/2021   Procedure: ESOPHAGOGASTRODUODENOSCOPY (EGD) WITH PROPOFOL;  Surgeon: Benancio Deeds, MD;  Location: [facility];   Service: Gastroenterology;  Laterality: N/A;   ESOPHAGOGASTRODUODENOSCOPY (EGD) WITH PROPOFOL N/A 05/18/2021   Procedure: ESOPHAGOGASTRODUODENOSCOPY (EGD) WITH PROPOFOL;  Surgeon: Sherrilyn Rist, MD;  Location: [facility] ENDOSCOPY;  Service: Gastroenterology;  Laterality: N/A;   HERNIA REPAIR     PERCUTANEOUS CORONARY STENT INTERVENTION (PCI-S)  01/05/2009   POLYPECTOMY  03/06/2021   Procedure: POLYPECTOMY;  Surgeon: Benancio Deeds, MD;  Location: [facility];  Service: Gastroenterology;;   RIGHT HEART CATH N/A 06/11/2022   Procedure: RIGHT HEART CATH;  Surgeon: Laurey Morale, MD;  Location: [facility] INVASIVE CV LAB;  Service: Cardiovascular;  Laterality: N/A;    SOCIAL HISTORY: Social History   Socioeconomic History   Marital status: Divorced    Spouse name: Not on file   Number of children: 1   Years of education: 12   Highest education level: Not on file  Occupational History   Occupation: Disability  Tobacco Use   Smoking status: Never   Smokeless tobacco: Never  Vaping Use   Vaping status: Never Used  Substance and Sexual Activity   Alcohol use: Not Currently    Comment: Last drink before 06/08/2022   Drug use: No   Sexual activity: Not Currently  Other Topics Concern   Not on file  Social History Narrative   Lives in [place] alone.  Disabled.  Previously worked as a Geophysicist/field seismologist.   Fun: Rest, Read and watch movies.  Social Determinants of Health   Financial Resource Strain: High Risk (06/30/2022)   Overall Financial Resource Strain (CARDIA)    Difficulty of Paying Living Expenses: Hard  Food Insecurity: Food Insecurity Present (07/14/2022)   Hunger Vital Sign    Worried About Running Out of Food in the Last Year: Sometimes true    Ran Out of Food in the Last Year: Sometimes true  Transportation Needs: Unmet Transportation Needs (10/07/2022)   PRAPARE - Administrator, Civil Service (Medical): Yes    Lack of Transportation (Non-Medical): Yes   Physical Activity: Unknown (03/21/2018)   Exercise Vital Sign    Days of Exercise per Week: Patient declined    Minutes of Exercise per Session: Patient declined  Stress: Stress Concern Present (03/21/2018)   [employer] of Occupational Health - Occupational Stress Questionnaire    Feeling of Stress : To some extent  Social Connections: Unknown (03/21/2018)   Social Connection and Isolation Panel [NHANES]    Frequency of Communication with Friends and Family: Patient declined    Frequency of Social Gatherings with Friends and Family: Patient declined    Attends Religious Services: Patient declined    Database administrator or Organizations: Patient declined    Attends Banker Meetings: Patient declined    Marital Status: Patient declined  Intimate Partner Violence: Not At Risk (06/11/2022)   Humiliation, Afraid, Rape, and Kick questionnaire    Fear of Current or Ex-Partner: No    Emotionally Abused: No    Physically Abused: No    Sexually Abused: No    FAMILY HISTORY: Family History  Problem Relation Age of Onset   Breast cancer Mother    Pancreatic cancer Mother    Hypertension Father    Alzheimer's disease Father    Gout Father    Dementia Father    Hypertension Sister    Clotting disorder Sister    Obesity Brother    Bronchitis Brother    Cancer Maternal Uncle        unknown type; dx after 49; x2 maternal uncles   Breast cancer Paternal Aunt        dx after 50   Lung cancer Paternal Uncle    Heart disease Maternal Grandmother    Gout Paternal Grandfather    Colon polyps Neg Hx    Colon cancer Neg Hx    Esophageal cancer Neg Hx    Stomach cancer Neg Hx     Review of Systems  Constitutional:  Negative for appetite change, chills, fatigue, fever and unexpected weight change.  HENT:   Negative for hearing loss, lump/mass and trouble swallowing.   Eyes:  Negative for eye problems and icterus.  Respiratory:  Negative for chest tightness, cough and  shortness of breath.   Cardiovascular:  Negative for chest pain, leg swelling and palpitations.  Gastrointestinal:  Negative for abdominal distention, abdominal pain, constipation, diarrhea, nausea and vomiting.  Endocrine: Negative for hot flashes.  Genitourinary:  Negative for difficulty urinating.   Musculoskeletal:  Negative for arthralgias.  Skin:  Negative for itching and rash.  Neurological:  Negative for dizziness, extremity weakness, headaches and numbness.  Hematological:  Negative for adenopathy. Does not bruise/bleed easily.  Psychiatric/Behavioral:  Negative for depression. The patient is not nervous/anxious.       PHYSICAL EXAMINATION   Vitals:   10/26/22 1148  BP: 127/72  Pulse: 77  Resp: 18  Temp: 97.7 F (36.5 C)  SpO2: 99%    Physical  Exam Constitutional:      Appearance: Normal appearance. He is not toxic-appearing.  HENT:     Head: Normocephalic and atraumatic.  Eyes:     General: No scleral icterus. Skin:    General: Skin is warm and dry.  Neurological:     General: No focal deficit present.     Mental Status: He is alert.  Psychiatric:        Mood and Affect: Mood normal.        Behavior: Behavior normal.     LABORATORY DATA:  CBC    Component Value Date/Time   WBC 10.3 09/25/2022 1116   WBC 13.3 (H) 07/31/2022 1209   WBC 9.2 06/16/2022 0145   RBC 4.59 09/25/2022 1116   RBC 4.08 (L) 07/31/2022 1209   HGB 13.9 09/25/2022 1116   HCT 44.8 09/25/2022 1116   PLT 243 09/25/2022 1116   MCV 98 (H) 09/25/2022 1116   MCH 30.3 09/25/2022 1116   MCH 31.9 07/31/2022 1209   MCHC 31.0 (L) 09/25/2022 1116   MCHC 32.6 07/31/2022 1209   RDW 11.9 09/25/2022 1116   LYMPHSABS 2.3 07/31/2022 1209   LYMPHSABS 1.5 07/20/2022 1111   MONOABS 0.9 07/31/2022 1209   EOSABS 0.1 07/31/2022 1209   EOSABS 0.1 07/20/2022 1111   BASOSABS 0.1 07/31/2022 1209   BASOSABS 0.1 07/20/2022 1111    CMP     Component Value Date/Time   NA 139 10/08/2022 1120   NA  139 09/25/2022 1116   K 3.9 10/08/2022 1120   CL 109 10/08/2022 1120   CO2 22 10/08/2022 1120   GLUCOSE 80 10/08/2022 1120   BUN 21 (H) 10/08/2022 1120   BUN 26 (H) 09/25/2022 1116   CREATININE 1.33 (H) 10/08/2022 1120   CREATININE 1.40 (H) 07/31/2022 1209   CALCIUM 9.1 10/08/2022 1120   PROT 7.7 09/25/2022 1116   ALBUMIN 4.1 09/25/2022 1116   AST 25 09/25/2022 1116   AST 17 07/31/2022 1209   ALT 19 09/25/2022 1116   ALT 19 07/31/2022 1209   ALKPHOS 51 09/25/2022 1116   BILITOT 0.4 09/25/2022 1116   BILITOT 0.6 07/31/2022 1209   GFRNONAA >60 10/08/2022 1120   GFRNONAA 58 (L) 07/31/2022 1209   GFRAA 73 02/19/2020 1054       ASSESSMENT and THERAPY PLAN:   Neutrophilic leukocytosis Maxsen is a 57 year old man here today for follow-up of his leukocytosis.  Elevated White Blood Cell Count Previous high white blood cell count, but most recent count was within normal range. -Draw blood today for current white blood cell count. -If results are normal, follow-up in 1 year.  Post Myocardial Infarction Reports improvement in overall health since the event in June. Still experiencing some shortness of breath with activity. Adherence to medication regimen was initially poor due to depression but has improved. -Continue current medication regimen. -Encourage continued lifestyle modifications including limited alcohol intake.  Depression Reports feeling depressed after myocardial infarction but states mood has improved and has a more positive outlook on life. -Monitor mood and consider referral to mental health if symptoms of depression recur.    All questions were answered. The patient knows to call the clinic with any problems, questions or concerns. We can certainly see the patient much sooner if necessary.  Total encounter time:20 minutes*in face-to-face visit time, chart review, lab review, care coordination, order entry, and documentation of the encounter time.  *Total  Encounter Time as defined by the Centers for Medicare and Medicaid Services includes, in  addition to the face-to-face time of a patient visit (documented in the note above) non-face-to-face time: obtaining and reviewing outside history, ordering and reviewing medications, tests or procedures, care coordination (communications with other health care professionals or caregivers) and documentation in the medical record.

## 2022-10-26 NOTE — Telephone Encounter (Signed)
This RN attempted to reach pt regarding the below results.

## 2022-10-26 NOTE — Assessment & Plan Note (Signed)
Pate is a 57 year old man here today for follow-up of his leukocytosis.  Elevated White Blood Cell Count Previous high white blood cell count, but most recent count was within normal range. -Draw blood today for current white blood cell count. -If results are normal, follow-up in 1 year.  Post Myocardial Infarction Reports improvement in overall health since the event in June. Still experiencing some shortness of breath with activity. Adherence to medication regimen was initially poor due to depression but has improved. -Continue current medication regimen. -Encourage continued lifestyle modifications including limited alcohol intake.  Depression Reports feeling depressed after myocardial infarction but states mood has improved and has a more positive outlook on life. -Monitor mood and consider referral to mental health if symptoms of depression recur.

## 2022-10-26 NOTE — Telephone Encounter (Signed)
-----   Message from Edgemoor Iruku sent at 10/26/2022  1:02 PM EDT ----- Thea Silversmith  His Leukocytosis is ok, there has been a significant change in his kidney function though. Can you talk to the pt and also make sure the information is shared with his PCP.  Thanks,

## 2022-10-28 ENCOUNTER — Telehealth: Payer: Self-pay | Admitting: *Deleted

## 2022-10-28 ENCOUNTER — Encounter: Payer: Self-pay | Admitting: *Deleted

## 2022-10-28 NOTE — Telephone Encounter (Addendum)
Pt returned call to this RN- discussed concern per this MD regarding his current renal function results on labs done in this office.  Pt does not presently see a nephrologist and per discussion states he will follow up with his primary verified as Dr Bertram Denver.   Left messages on both mobile and home phone lines with identified VM's- as well as forwarded labs to his primary care MD.   This message will also be sent via My Chart per pt has noted open account. ----- Message ----- From: Rachel Moulds, MD Sent: 10/26/2022   1:02 PM EDT To: Stefan Church, RN  [place]  His Leukocytosis is ok, there has been a significant change in his kidney function though. Can you talk to the pt and also make sure the information is shared with his PCP.  Thanks,

## 2022-10-30 NOTE — Telephone Encounter (Signed)
Patient identified by name and date of birth.  Patient sent information for vis MyChart to schedule appointment.

## 2022-10-30 NOTE — Telephone Encounter (Signed)
Pls let Stanley [name] know he was referred to the kidney doctor back in July. Im not sure why he is currently not seeing them but here is the number below to schedule  Note: Sent referral to [facility]  Ph. # [phone] Address [address] [place] [address] They will contact the patient to schedule an appointment.

## 2022-12-11 ENCOUNTER — Other Ambulatory Visit: Payer: Self-pay | Admitting: Nurse Practitioner

## 2022-12-11 DIAGNOSIS — I428 Other cardiomyopathies: Secondary | ICD-10-CM

## 2022-12-23 ENCOUNTER — Ambulatory Visit (INDEPENDENT_AMBULATORY_CARE_PROVIDER_SITE_OTHER): Payer: Medicaid Other

## 2022-12-23 DIAGNOSIS — I5022 Chronic systolic (congestive) heart failure: Secondary | ICD-10-CM

## 2022-12-23 DIAGNOSIS — I428 Other cardiomyopathies: Secondary | ICD-10-CM

## 2022-12-23 LAB — CUP PACEART REMOTE DEVICE CHECK
Battery Remaining Longevity: 72 mo
Battery Remaining Percentage: 63 %
Brady Statistic RV Percent Paced: 0 %
Date Time Interrogation Session: 20241218040100
HighPow Impedance: 49 Ohm
Implantable Lead Connection Status: 753985
Implantable Lead Implant Date: 20150601
Implantable Lead Location: 753860
Implantable Lead Model: 295
Implantable Lead Serial Number: 134196
Implantable Pulse Generator Implant Date: 20150601
Lead Channel Impedance Value: 402 Ohm
Lead Channel Pacing Threshold Amplitude: 1.4 V
Lead Channel Pacing Threshold Pulse Width: 0.6 ms
Lead Channel Setting Pacing Amplitude: 3 V
Lead Channel Setting Pacing Pulse Width: 0.6 ms
Lead Channel Setting Sensing Sensitivity: 0.6 mV
Pulse Gen Serial Number: 190689
Zone Setting Status: 755011

## 2023-01-01 ENCOUNTER — Ambulatory Visit: Payer: Self-pay | Admitting: Nurse Practitioner

## 2023-01-04 ENCOUNTER — Other Ambulatory Visit: Payer: Self-pay | Admitting: Family Medicine

## 2023-01-04 ENCOUNTER — Other Ambulatory Visit: Payer: Self-pay | Admitting: Nurse Practitioner

## 2023-01-04 DIAGNOSIS — I428 Other cardiomyopathies: Secondary | ICD-10-CM

## 2023-01-07 ENCOUNTER — Other Ambulatory Visit: Payer: Self-pay | Admitting: Nurse Practitioner

## 2023-01-07 ENCOUNTER — Other Ambulatory Visit: Payer: Self-pay

## 2023-01-07 DIAGNOSIS — I700 Atherosclerosis of aorta: Secondary | ICD-10-CM

## 2023-01-07 DIAGNOSIS — I428 Other cardiomyopathies: Secondary | ICD-10-CM

## 2023-01-07 DIAGNOSIS — I2510 Atherosclerotic heart disease of native coronary artery without angina pectoris: Secondary | ICD-10-CM

## 2023-01-08 ENCOUNTER — Encounter (HOSPITAL_COMMUNITY): Payer: Self-pay

## 2023-01-18 ENCOUNTER — Other Ambulatory Visit (HOSPITAL_COMMUNITY): Payer: Self-pay | Admitting: *Deleted

## 2023-01-18 DIAGNOSIS — I428 Other cardiomyopathies: Secondary | ICD-10-CM

## 2023-01-18 DIAGNOSIS — I700 Atherosclerosis of aorta: Secondary | ICD-10-CM

## 2023-01-18 DIAGNOSIS — I2510 Atherosclerotic heart disease of native coronary artery without angina pectoris: Secondary | ICD-10-CM

## 2023-01-18 MED ORDER — AMIODARONE HCL 200 MG PO TABS
200.0000 mg | ORAL_TABLET | Freq: Every day | ORAL | 1 refills | Status: DC
Start: 2023-01-18 — End: 2023-06-25

## 2023-01-18 MED ORDER — EMPAGLIFLOZIN 10 MG PO TABS: 10.0000 mg | ORAL_TABLET | Freq: Every day | ORAL | 3 refills | Status: DC

## 2023-01-18 MED ORDER — EPLERENONE 25 MG PO TABS: 25.0000 mg | ORAL_TABLET | Freq: Every day | ORAL | 1 refills | Status: DC

## 2023-01-18 MED ORDER — ATORVASTATIN CALCIUM 20 MG PO TABS: 20.0000 mg | ORAL_TABLET | Freq: Every day | ORAL | 3 refills | Status: DC

## 2023-01-22 ENCOUNTER — Other Ambulatory Visit: Payer: Self-pay

## 2023-01-22 ENCOUNTER — Ambulatory Visit (HOSPITAL_COMMUNITY)
Admission: RE | Admit: 2023-01-22 | Discharge: 2023-01-22 | Disposition: A | Discharge status: Home / Self Care | Payer: Self-pay | Source: Ambulatory Visit | Attending: Family Medicine

## 2023-01-22 ENCOUNTER — Encounter (HOSPITAL_COMMUNITY): Payer: Self-pay

## 2023-01-22 VITALS — BP 130/80 | HR 72 | Ht 65.0 in | Wt 168.8 lb

## 2023-01-22 DIAGNOSIS — I130 Hypertensive heart and chronic kidney disease with heart failure and stage 1 through stage 4 chronic kidney disease, or unspecified chronic kidney disease: Secondary | ICD-10-CM | POA: Insufficient documentation

## 2023-01-22 DIAGNOSIS — R9431 Abnormal electrocardiogram [ECG] [EKG]: Secondary | ICD-10-CM | POA: Insufficient documentation

## 2023-01-22 DIAGNOSIS — I428 Other cardiomyopathies: Secondary | ICD-10-CM | POA: Insufficient documentation

## 2023-01-22 DIAGNOSIS — I472 Ventricular tachycardia, unspecified: Secondary | ICD-10-CM

## 2023-01-22 DIAGNOSIS — Z95810 Presence of automatic (implantable) cardiac defibrillator: Secondary | ICD-10-CM | POA: Insufficient documentation

## 2023-01-22 DIAGNOSIS — N183 Chronic kidney disease, stage 3 unspecified: Secondary | ICD-10-CM | POA: Insufficient documentation

## 2023-01-22 DIAGNOSIS — N1831 Chronic kidney disease, stage 3a: Secondary | ICD-10-CM

## 2023-01-22 DIAGNOSIS — G4733 Obstructive sleep apnea (adult) (pediatric): Secondary | ICD-10-CM

## 2023-01-22 DIAGNOSIS — I5022 Chronic systolic (congestive) heart failure: Secondary | ICD-10-CM

## 2023-01-22 DIAGNOSIS — I2510 Atherosclerotic heart disease of native coronary artery without angina pectoris: Secondary | ICD-10-CM

## 2023-01-22 DIAGNOSIS — F1010 Alcohol abuse, uncomplicated: Secondary | ICD-10-CM

## 2023-01-22 DIAGNOSIS — F32A Depression, unspecified: Secondary | ICD-10-CM | POA: Insufficient documentation

## 2023-01-22 DIAGNOSIS — I10 Essential (primary) hypertension: Secondary | ICD-10-CM

## 2023-01-22 DIAGNOSIS — Z955 Presence of coronary angioplasty implant and graft: Secondary | ICD-10-CM | POA: Insufficient documentation

## 2023-01-22 DIAGNOSIS — R0789 Other chest pain: Secondary | ICD-10-CM | POA: Insufficient documentation

## 2023-01-22 LAB — BASIC METABOLIC PANEL
Anion gap: 10 (ref 5–15)
BUN: 15 mg/dL (ref 6–20)
CO2: 24 mmol/L (ref 22–32)
Calcium: 9.3 mg/dL (ref 8.9–10.3)
Chloride: 106 mmol/L (ref 98–111)
Creatinine, Ser: 1.22 mg/dL (ref 0.61–1.24)
GFR, Estimated: >60 mL/min (ref >60)
Glucose, Bld: 84 mg/dL (ref 70–99)
Potassium: 3.7 mmol/L (ref 3.5–5.1)
Sodium: 140 mmol/L (ref 135–145)

## 2023-01-22 LAB — BRAIN NATRIURETIC PEPTIDE: B Natriuretic Peptide: 208.1 pg/mL — ABNORMAL HIGH (ref 0.0–100.0)

## 2023-01-22 MED ORDER — LOSARTAN POTASSIUM 50 MG PO TABS
50.0000 mg | ORAL_TABLET | Freq: Every day | ORAL | 6 refills | Status: DC
  Filled 2023-01-22: qty 30, 30d supply, fill #0

## 2023-01-22 MED ORDER — EPLERENONE 25 MG PO TABS
25.0000 mg | ORAL_TABLET | Freq: Every day | ORAL | 1 refills | Status: DC
  Filled 2023-01-22: qty 90, 90d supply, fill #0

## 2023-01-22 NOTE — Progress Notes (Addendum)
ADVANCED HF CLINIC NOTE  PCP: [name], NP EP: Dr. Lalla Brothers HF Cardiologist: Dr. Gala Romney  CC: HF follow up  HPI: Stanley [name] is a 58 y.o. male with systolic HF due to NICM d/x'd in 2006 (felt 2/2 HTN/ETOH), ? CAD s/p PCI to unknown vessel 2011 at outside location (cath 2014 without CAD and normal coronaries by CT 07/2016), VT s/p [facility] ICD implant ([place] 2015), noncompliance, chronic noncardiac chest pain, depression, HTN and ETOH abuse.    In 2017 he received inappropriate therapy for ST with ATP (after a work out at [employer] and reported noncompliance with meds) that degenerated into VT and received several shocks. He has had issues with chronic chest pain without evidence for ischemic etiology including normal cath and cardiac CT as above. This is relatively unchanged. Echo 03/2016  EF 35-40%, grade 1 DD, mild MR, moderate LAE  Had recurrent ICD shock in 3/20 in setting of medicine noncompliance. Amio continued. Echo 3/20 EF 25-30% Normal RV.   Echo 3/21: EF 25-30% RV ok S/p Barostim 7/21 Myoview 5/22: EF 34% fixed inferior defect  Admitted 6/24 with recurrent VT. 2/2 intoxication and metabolic derangement (K 3.1, Mag 1.2). Started on amiodarone gtt. Echo showed EF < 20%, RV mildly reduced. Required pressors and ICU monitoring due to worsening respiratory failure and ETOH withdrawal. Underwent RHC showing elevated RA pressures, normal PCWP, mild to moderate PAH and decreased output with CI (Thermo) 1.7. DBA gtt started. Drips weaned and amio transitioned to oral. GDMT titrated, but limited by AKI. He was discharged home, weight 136 lbs.  Echo 10/08/22 EF 30-35% RV ok. Mild to moderate MR   Today he returns for HF follow up. Overall feeling fine. Felt more SOB off digoxin. No SOB with ADLs, mild SOB walking on flat ground. He feels like he has fluid on board. Denies palpitations, abnormal bleeding, CP, dizziness, edema, or PND/Orthopnea. Appetite ok. No fever or chills.  Weight at home 172 pounds. Out of losartan and Inspra x 1 month. Stopped drinking liquor and beer completely. Still drinks wine on weekends (2 glasses each day). Has started taking PRN Lasix daily.  Cardiac Studies - Echo (10/24): EF 30-35%, RV ok, mild to moderate MR  - RHC (6/24): RA mean 10, PA 50/23 (mean 34), PCWP mean 11, CO/CI (Fick) 3.87/2.25, CO/CI (thermo) 2.92/1.78,  PVR 5.9  WU, PAPi 2.7  - Echo (6/24): EF < 20%, grade I DD, RV mildly reduced, moderate MR, moderate to severe TR  - Myoview (5/22): EF 34% fixed inferior defect  - S/p Barostim (7/21)  - Echo (3/21): EF 25-30% RV ok  - CPX (09/19/18) FVC 1.96 (57%)      FEV1 1.60 (58%)        FEV1/FVC 81 (101%)        MVV 80 (61%)  Resting HR: 77 Peak HR: 131   (79% age predicted max HR)  BP rest: 168/80          Standing BP: 156/74 BP peak: 196/80   Peak VO2: 14.9 (52% predicted peak VO2)  VE/VCO2 slope:  31  OUES: 1.43  Peak RER: 1.05  VE/MVV:  50% O2pulse:  10   (67% predicted O2pulse)  Moderate to severe HF limitation but normal VeVCO2 slope is reassuring. Restrictive lung physiology.   - Echo (3/20) EF 25-30% Normal RV.   - Echo (3/18):  EF 35-40%, grade 1 DD, mild MR, moderate LAE  Past Medical History:  Diagnosis Date  Acute renal failure (ARF) (HCC)    Acute respiratory failure with hypoxia (HCC) 12/29/2018   Alcohol abuse    Anxiety    CAD (coronary artery disease)    a. dx unclear, reported PCI in 2011 but cath 2014 normal coronaries and cardiac CT 07/2016 normal coronaries, no mention of stents.   Chronic chest pain    Chronic systolic CHF (congestive heart failure) (HCC)    Depression    Dyspnea    07/05/2019: per patient some times with exertion, "has to catch breath"   Family history of breast cancer 05/30/2021   Family history of pancreatic cancer 05/30/2021   Financial difficulties    Gout    Gouty arthritis    Headache    History of noncompliance with medical treatment    financial  challenges   Hypertension    ICD (implantable cardioverter-defibrillator) in place    [employer] [facility] ICD implant done in [place] 2015   Insomnia    Lobar pneumonia (HCC) 12/28/2018   Myocardial infarction (HCC) 2005   Nonischemic cardiomyopathy (HCC)    Sleep apnea    per patient, has CPAP does not wear it every night   Ventricular tachycardia (HCC) 01/2015   2017 - ATP delivered for sinus tach, degenerated into VT for which he received shock. Recurrent VT 03/2018.   Current Outpatient Medications  Medication Sig Dispense Refill   allopurinol (ZYLOPRIM) 300 MG tablet TAKE 1 TABLET EVERY DAY (NEED MD APPOINTMENT) 30 tablet 11   amiodarone (PACERONE) 200 MG tablet Take 1 tablet (200 mg total) by mouth daily. 90 tablet 1   aspirin EC 81 MG tablet Take 81 mg by mouth daily. Swallow whole.     atorvastatin (LIPITOR) 20 MG tablet Take 1 tablet (20 mg total) by mouth daily. 90 tablet 3   empagliflozin (JARDIANCE) 10 MG TABS tablet Take 1 tablet (10 mg total) by mouth daily. 90 tablet 3   furosemide (LASIX) 80 MG tablet MAY TAKE 1 TABLET AS NEEDED FOR SWELLING. APPOINTMENT NEEDED FOR REFILLS 90 tablet 1   nitroGLYCERIN (NITROSTAT) 0.4 MG SL tablet Place 1 tablet (0.4 mg total) under the tongue every 5 (five) minutes for 3 doses as needed for chest pain. 75 each 6   traZODone (DESYREL) 150 MG tablet TAKE 1 TABLET AT BEDTIME 90 tablet 3   eplerenone (INSPRA) 25 MG tablet Take 1 tablet (25 mg total) by mouth daily. (Patient not taking: Reported on 01/22/2023) 90 tablet 1   losartan (COZAAR) 50 MG tablet Take 1 tablet (50 mg total) by mouth daily. (Patient not taking: Reported on 01/22/2023) 30 tablet 6   No current facility-administered medications for this encounter.   Allergies  Allergen Reactions   Lisinopril Shortness Of Breath    Makes lips swell   Allopurinol Nausea Only    dizziness   Entresto [Sacubitril-Valsartan]     History of angioedema   Other Swelling    Nuts - Lip Swelling   Shellfish  Allergy Swelling    Lip Swelling   Spironolactone     Breast tenderness   Social History   Socioeconomic History   Marital status: Divorced    Spouse name: Not on file   Number of children: 1   Years of education: 12   Highest education level: Not on file  Occupational History   Occupation: Disability  Tobacco Use   Smoking status: Never   Smokeless tobacco: Never  Vaping Use   Vaping status: Never Used  Substance and  Sexual Activity   Alcohol use: Not Currently    Comment: Last drink before 06/08/2022   Drug use: No   Sexual activity: Not Currently  Other Topics Concern   Not on file  Social History Narrative   Lives in [place] alone.  Disabled.  Previously worked as a Geophysicist/field seismologist.   Fun: Rest, Read and watch movies.    Social Drivers of Health   Financial Resource Strain: High Risk (06/30/2022)   Overall Financial Resource Strain (CARDIA)    Difficulty of Paying Living Expenses: Hard  Food Insecurity: Food Insecurity Present (07/14/2022)   Hunger Vital Sign    Worried About Running Out of Food in the Last Year: Sometimes true    Ran Out of Food in the Last Year: Sometimes true  Transportation Needs: Unmet Transportation Needs (10/07/2022)   PRAPARE - Administrator, Civil Service (Medical): Yes    Lack of Transportation (Non-Medical): Yes  Physical Activity: Unknown (03/21/2018)   Exercise Vital Sign    Days of Exercise per Week: Patient declined    Minutes of Exercise per Session: Patient declined  Stress: Stress Concern Present (03/21/2018)   [employer] of Occupational Health - Occupational Stress Questionnaire    Feeling of Stress : To some extent  Social Connections: Unknown (03/21/2018)   Social Connection and Isolation Panel [NHANES]    Frequency of Communication with Friends and Family: Patient declined    Frequency of Social Gatherings with Friends and Family: Patient declined    Attends Religious Services: Patient declined     Database administrator or Organizations: Patient declined    Attends Banker Meetings: Patient declined    Marital Status: Patient declined  Intimate Partner Violence: Not At Risk (06/11/2022)   Humiliation, Afraid, Rape, and Kick questionnaire    Fear of Current or Ex-Partner: No    Emotionally Abused: No    Physically Abused: No    Sexually Abused: No   Family History  Problem Relation Age of Onset   Breast cancer Mother    Pancreatic cancer Mother    Hypertension Father    Alzheimer's disease Father    Gout Father    Dementia Father    Hypertension Sister    Clotting disorder Sister    Obesity Brother    Bronchitis Brother    Cancer Maternal Uncle        unknown type; dx after 20; x2 maternal uncles   Breast cancer Paternal Aunt        dx after 64   Lung cancer Paternal Uncle    Heart disease Maternal Grandmother    Gout Paternal Grandfather    Colon polyps Neg Hx    Colon cancer Neg Hx    Esophageal cancer Neg Hx    Stomach cancer Neg Hx    BP 130/80   Pulse 72   Ht 5\' 5"  (1.651 m)   Wt 76.6 kg (168 lb 12.8 oz)   SpO2 95%   BMI 28.09 kg/m   Wt Readings from Last 3 Encounters:  01/22/23 76.6 kg (168 lb 12.8 oz)  10/26/22 71.9 kg (158 lb 9.6 oz)  10/08/22 72.9 kg (160 lb 12.8 oz)   PHYSICAL EXAM: General:  NAD. No resp difficulty, walked into  HEENT: Normal Neck: Supple. JVP 8-10. Carotids 2+ bilat; no bruits. No lymphadenopathy or thryomegaly appreciated. Cor: PMI nondisplaced. Regular rate & rhythm. No rubs, gallops, 2/6 SEM Lungs: Clear Abdomen: Soft, nontender, +distended. No hepatosplenomegaly. No  bruits or masses. Good bowel sounds. Extremities: No cyanosis, clubbing, rash, edema Neuro: Alert & oriented x 3, cranial nerves grossly intact. Moves all 4 extremities w/o difficulty. Affect pleasant.  ReDs: 47%  ECG (personally reviewed): SR with frequent PVCs, LBBB 75 bpm, QRS 190 msec (barostim artifact)  Device interrogation (personally  reviewed): No VT. Activity 2.3 hr/day, average HR 73 bpm   ASSESSMENT & PLAN:  1. VT  - Shocks from [employer] ICD (06/08/22), 2/2 hypomag and hypokalemia, ETOH abuse and medication noncompliance..  - Continue amiodarone per EP - No recurrent VT  - Device interrogation as above - Amio labs (9/24) ok  2. Chronic systolic CHF - Mostly NICM (? H/o PCI to unknown vessel in 2011.) CTA 2018 normal cors - s/p [employer] ICD - Echo (3/20): EF 25-30% - CPX 9/20 moderate to severe HF limitation. pVO2: 14.9 (52% predicted peak VO2).  slope: 31  pRER: 1.05  - Echo (03/10/19): EF 25-30% RV ok Mild MR - Myoview (5/22): EF 34% fixed inferior defect - Echo (6/24): EF < 20%, RV mildly reduced (in setting of VT), moderate-severe TR, moderate MR.  - RHC (6/24): RA 10, PCWP 11, CO/CI (Thermo) 2.9/1.78. >>started on DBA - CMP likely 2/2 ETOH -> stopped drinking in 6/24  - Echo (10/24): EF 30-35%  - Stable NYHA IIb. Volume up, ReDs 47%, weight up 10 lbs. - Continue Lasix 80 mg daily. - Restart losartan 50 mg daily (no ARNi/ACE w/ angioedema with Entresto and lisinopril). - Restart eplerenone 25 mg daily. - Continue Jardiance 10 mg daily. - Add Toprol next - Labs today, repeat BMET in 10 days.  3. ETOH abuse - Heavy ETOH use, likely contributes to cardiomyopathy.  - Has cut way back since d/c. Congratulated  4. CKD 3  - Baseline SCr 1.3-1.6 - Continue SGLT2i - stable - Labs today.  5. HTN - BP elevated today, out of several BP active meds  - Restart meds as above  6. CAD - h/o single vessel PCI - Myoview (5/22): EF 34% fixed inferior defect - No s/s angina - Continue ASA + statin  7. OSA - Unable to tolerate CPAP  Follow up in 3-4 weeks with APP (fluid check and add Toprol)  Jacklynn Ganong, FNP  3:56 PM

## 2023-01-22 NOTE — Progress Notes (Signed)
ReDS Vest / Clip - 01/22/23 1617       ReDS Vest / Clip   Station Marker A    Ruler Value 23    ReDS Value Range High volume overload    ReDS Actual Value 47    Anatomical Comments --   reds clip moved below device lower on chest

## 2023-01-22 NOTE — Patient Instructions (Addendum)
Medication Changes:  RESTART EPLERENONE 25MG  ONCE DAILY   RESTART: LOSARTAN 50MG  ONCE DAILY   CONTINUE LASIX (FUROSEMIDE) 80MG  DAILY   Lab Work:  Labs done today, your results will be available in MyChart, we will contact you for abnormal readings.  THEN AGAIN IN 10 DAYS AS SCHEDULED   Follow-Up in: 3 WEEKS WITH APP AS SCHEDULED   At the [facility], you and your health needs are our priority. We have a designated team specialized in the treatment of Heart Failure. This Care Team includes your primary Heart Failure Specialized Cardiologist (physician), Advanced Practice Providers (APPs- Physician Assistants and Nurse Practitioners), and Pharmacist who all work together to provide you with the care you need, when you need it.   You may see any of the following providers on your designated Care Team at your next follow up:  Dr. Arvilla Meres Dr. Marca Ancona Dr. Dorthula Nettles Dr. Theresia Bough Tonye Becket, NP Robbie Lis, [place] [facility], [place] Brynda Peon, NP [place] Lee, NP Karle Plumber, PharmD   Please be sure to bring in all your medications bottles to every appointment.   Need to Contact us:  If you have any questions or concerns before your next appointment please send [place] a message through [place] or call our office at [phone].    TO LEAVE A MESSAGE FOR THE NURSE SELECT OPTION 2, PLEASE LEAVE A MESSAGE INCLUDING: YOUR NAME DATE OF BIRTH CALL BACK NUMBER REASON FOR CALL**this is important as we prioritize the call backs  YOU WILL RECEIVE A CALL BACK THE SAME DAY AS LONG AS YOU CALL BEFORE 4:00 PM

## 2023-01-25 ENCOUNTER — Other Ambulatory Visit: Payer: Self-pay

## 2023-01-26 ENCOUNTER — Ambulatory Visit: Payer: Self-pay | Admitting: Nurse Practitioner

## 2023-02-01 ENCOUNTER — Ambulatory Visit (HOSPITAL_COMMUNITY)
Admission: RE | Admit: 2023-02-01 | Discharge: 2023-02-01 | Disposition: A | Discharge status: Home / Self Care | Payer: Self-pay | Source: Ambulatory Visit | Attending: Cardiology | Admitting: Cardiology

## 2023-02-01 DIAGNOSIS — I5022 Chronic systolic (congestive) heart failure: Secondary | ICD-10-CM | POA: Insufficient documentation

## 2023-02-01 LAB — BASIC METABOLIC PANEL
Anion gap: 10 (ref 5–15)
BUN: 16 mg/dL (ref 6–20)
CO2: 19 mmol/L — ABNORMAL LOW (ref 22–32)
Calcium: 8.7 mg/dL — ABNORMAL LOW (ref 8.9–10.3)
Chloride: 110 mmol/L (ref 98–111)
Creatinine, Ser: 1.09 mg/dL (ref 0.61–1.24)
GFR, Estimated: >60 mL/min (ref >60)
Glucose, Bld: 83 mg/dL (ref 70–99)
Potassium: 3.5 mmol/L (ref 3.5–5.1)
Sodium: 139 mmol/L (ref 135–145)

## 2023-02-01 NOTE — Progress Notes (Signed)
Remote ICD transmission.

## 2023-02-09 ENCOUNTER — Encounter (HOSPITAL_COMMUNITY): Payer: Self-pay

## 2023-02-09 ENCOUNTER — Emergency Department (HOSPITAL_COMMUNITY): Payer: Self-pay

## 2023-02-09 ENCOUNTER — Observation Stay (HOSPITAL_COMMUNITY)
Admission: EM | Admit: 2023-02-09 | Discharge: 2023-02-11 | Disposition: A | Discharge status: Still In Hospital | Payer: Self-pay | Attending: Internal Medicine | Admitting: Internal Medicine

## 2023-02-09 DIAGNOSIS — Z8679 Personal history of other diseases of the circulatory system: Secondary | ICD-10-CM | POA: Insufficient documentation

## 2023-02-09 DIAGNOSIS — I110 Hypertensive heart disease with heart failure: Secondary | ICD-10-CM | POA: Insufficient documentation

## 2023-02-09 DIAGNOSIS — Z20822 Contact with and (suspected) exposure to covid-19: Secondary | ICD-10-CM | POA: Insufficient documentation

## 2023-02-09 DIAGNOSIS — I5022 Chronic systolic (congestive) heart failure: Principal | ICD-10-CM | POA: Insufficient documentation

## 2023-02-09 DIAGNOSIS — Z79899 Other long term (current) drug therapy: Secondary | ICD-10-CM | POA: Insufficient documentation

## 2023-02-09 DIAGNOSIS — J189 Pneumonia, unspecified organism: Secondary | ICD-10-CM | POA: Insufficient documentation

## 2023-02-09 DIAGNOSIS — Z86718 Personal history of other venous thrombosis and embolism: Secondary | ICD-10-CM | POA: Insufficient documentation

## 2023-02-09 DIAGNOSIS — R0602 Shortness of breath: Secondary | ICD-10-CM

## 2023-02-09 DIAGNOSIS — M109 Gout, unspecified: Secondary | ICD-10-CM | POA: Insufficient documentation

## 2023-02-09 DIAGNOSIS — Z7901 Long term (current) use of anticoagulants: Secondary | ICD-10-CM | POA: Insufficient documentation

## 2023-02-09 DIAGNOSIS — I509 Heart failure, unspecified: Principal | ICD-10-CM

## 2023-02-09 DIAGNOSIS — N179 Acute kidney failure, unspecified: Secondary | ICD-10-CM | POA: Insufficient documentation

## 2023-02-09 DIAGNOSIS — G4700 Insomnia, unspecified: Secondary | ICD-10-CM | POA: Insufficient documentation

## 2023-02-09 LAB — CBC WITH DIFFERENTIAL/PLATELET
Abs Immature Granulocytes: 0.07 10*3/uL (ref 0.00–0.07)
Basophils Absolute: 0.1 10*3/uL (ref 0.0–0.1)
Basophils Relative: 1 %
Eosinophils Absolute: 0.2 10*3/uL (ref 0.0–0.5)
Eosinophils Relative: 2 %
HCT: 42.1 % (ref 39.0–52.0)
Hemoglobin: 14.3 g/dL (ref 13.0–17.0)
Immature Granulocytes: 1 %
Lymphocytes Relative: 12 %
Lymphs Abs: 1.7 10*3/uL (ref 0.7–4.0)
MCH: 33.4 pg (ref 26.0–34.0)
MCHC: 34 g/dL (ref 30.0–36.0)
MCV: 98.4 fL (ref 80.0–100.0)
Monocytes Absolute: 1.3 10*3/uL — ABNORMAL HIGH (ref 0.1–1.0)
Monocytes Relative: 9 %
Neutro Abs: 10.3 10*3/uL — ABNORMAL HIGH (ref 1.7–7.7)
Neutrophils Relative %: 75 %
Platelets: 169 10*3/uL (ref 150–400)
RBC: 4.28 MIL/uL (ref 4.22–5.81)
RDW: 12.3 % (ref 11.5–15.5)
WBC: 13.6 10*3/uL — ABNORMAL HIGH (ref 4.0–10.5)
nRBC: 0 % (ref 0.0–0.2)

## 2023-02-09 LAB — RESP PANEL BY RT-PCR (RSV, FLU A&B, COVID)  RVPGX2
Influenza A by PCR: NEGATIVE
Influenza B by PCR: NEGATIVE
Resp Syncytial Virus by PCR: NEGATIVE
SARS Coronavirus 2 by RT PCR: NEGATIVE

## 2023-02-09 LAB — COMPREHENSIVE METABOLIC PANEL
ALT: 27 U/L (ref 0–44)
AST: 36 U/L (ref 15–41)
Albumin: 3.3 g/dL — ABNORMAL LOW (ref 3.5–5.0)
Alkaline Phosphatase: 63 U/L (ref 38–126)
Anion gap: 12 (ref 5–15)
BUN: 11 mg/dL (ref 6–20)
CO2: 17 mmol/L — ABNORMAL LOW (ref 22–32)
Calcium: 8.9 mg/dL (ref 8.9–10.3)
Chloride: 109 mmol/L (ref 98–111)
Creatinine, Ser: 1.47 mg/dL — ABNORMAL HIGH (ref 0.61–1.24)
GFR, Estimated: 55 mL/min — ABNORMAL LOW (ref >60)
Glucose, Bld: 108 mg/dL — ABNORMAL HIGH (ref 70–99)
Potassium: 3.2 mmol/L — ABNORMAL LOW (ref 3.5–5.1)
Sodium: 138 mmol/L (ref 135–145)
Total Bilirubin: 2.4 mg/dL — ABNORMAL HIGH (ref 0.0–1.2)
Total Protein: 7.4 g/dL (ref 6.5–8.1)

## 2023-02-09 LAB — TROPONIN I (HIGH SENSITIVITY)
Troponin I (High Sensitivity): 65 ng/L — ABNORMAL HIGH (ref <18)
Troponin I (High Sensitivity): 56 ng/L — ABNORMAL HIGH (ref <18)

## 2023-02-09 LAB — BRAIN NATRIURETIC PEPTIDE: B Natriuretic Peptide: 1195.9 pg/mL — ABNORMAL HIGH (ref 0.0–100.0)

## 2023-02-09 NOTE — ED Notes (Signed)
VERBAL CONSENT GIVEN FOR MSE

## 2023-02-09 NOTE — ED Triage Notes (Signed)
PT STATES HE HAS BEEN SOB AND HAVING CHEST TIGHTNESS AND PALPITATIONS X PAST SEVERAL DAYS, AS WELL AS FEVER AND CHILLS AND BILATERAL FLANK PAIN; PT HAS DEFIBRILLATOR/PACEMAKER; HX MI LAST YEAR, CHF, CAD

## 2023-02-09 NOTE — ED Provider Triage Note (Signed)
 Emergency Medicine Provider Triage Evaluation Note  Stanley [name] , a 58 y.o. male  was evaluated in triage.  Pt complains of chest tightness in the front of his chest as well as shortness of breath for past couple days.  Denies any acute worsening.  Denies any radiation of pain to his back.  Denies any dizziness, nausea or vomiting.  Reports some fever and chills as well as abdominal pain.  Review of Systems  Positive: As above Negative: As above  Physical Exam  BP (!) 157/96 (BP Location: Left Arm)   Pulse 97   Temp 99.7 F (37.6 C) (Oral)   Resp 20   Wt 71.7 kg   SpO2 97%   BMI 26.29 kg/m  Gen:   Awake, no distress   Resp:  Normal effort  MSK:   Moves extremities without difficulty    Medical Decision Making  Medically screening exam initiated at 1:12 PM.  Appropriate orders placed.  Stanley [name] was informed that the remainder of the evaluation will be completed by another provider, this initial triage assessment does not replace that evaluation, and the importance of remaining in the ED until their evaluation is complete.     Veta Palma, PA-C 02/09/23 2220

## 2023-02-10 ENCOUNTER — Encounter (HOSPITAL_COMMUNITY): Payer: Self-pay | Admitting: Internal Medicine

## 2023-02-10 ENCOUNTER — Other Ambulatory Visit: Payer: Self-pay

## 2023-02-10 DIAGNOSIS — G4700 Insomnia, unspecified: Secondary | ICD-10-CM

## 2023-02-10 DIAGNOSIS — I509 Heart failure, unspecified: Secondary | ICD-10-CM

## 2023-02-10 LAB — RESPIRATORY PANEL BY PCR

## 2023-02-10 LAB — BASIC METABOLIC PANEL
Anion gap: 12 (ref 5–15)
BUN: 17 mg/dL (ref 6–20)
CO2: 19 mmol/L — ABNORMAL LOW (ref 22–32)
Calcium: 8.9 mg/dL (ref 8.9–10.3)
Chloride: 103 mmol/L (ref 98–111)
Creatinine, Ser: 1.5 mg/dL — ABNORMAL HIGH (ref 0.61–1.24)
GFR, Estimated: 53 mL/min — ABNORMAL LOW (ref >60)
Glucose, Bld: 106 mg/dL — ABNORMAL HIGH (ref 70–99)
Potassium: 3.4 mmol/L — ABNORMAL LOW (ref 3.5–5.1)
Sodium: 134 mmol/L — ABNORMAL LOW (ref 135–145)

## 2023-02-10 LAB — PROCALCITONIN: Procalcitonin: 0.27 ng/mL

## 2023-02-10 LAB — MAGNESIUM: Magnesium: 1.5 mg/dL — ABNORMAL LOW (ref 1.7–2.4)

## 2023-02-10 MED ORDER — MAGNESIUM SULFATE 4 GM/100ML IV SOLN
4.0000 g | Freq: Once | INTRAVENOUS | Status: AC
  Administered 2023-02-11: 4 g via INTRAVENOUS
  Filled 2023-02-10: qty 100

## 2023-02-10 MED ORDER — AZITHROMYCIN 250 MG PO TABS
500.0000 mg | ORAL_TABLET | Freq: Every day | ORAL | Status: DC
  Administered 2023-02-11: 500 mg via ORAL
  Filled 2023-02-10: qty 2

## 2023-02-10 MED ORDER — FUROSEMIDE 10 MG/ML IJ SOLN
60.0000 mg | Freq: Once | INTRAMUSCULAR | Status: AC
  Administered 2023-02-10: 60 mg via INTRAVENOUS
  Filled 2023-02-10: qty 6

## 2023-02-10 MED ORDER — POTASSIUM CHLORIDE 10 MEQ/100ML IV SOLN
10.0000 meq | INTRAVENOUS | Status: DC
Start: 2023-02-10 — End: 2023-02-10
  Administered 2023-02-10 (×2): 10 meq via INTRAVENOUS
  Filled 2023-02-10 (×2): qty 100

## 2023-02-10 MED ORDER — POTASSIUM CHLORIDE CRYS ER 20 MEQ PO TBCR
40.0000 meq | EXTENDED_RELEASE_TABLET | Freq: Once | ORAL | Status: AC
  Administered 2023-02-11: 40 meq via ORAL
  Filled 2023-02-10: qty 2

## 2023-02-10 MED ORDER — ALLOPURINOL 100 MG PO TABS
300.0000 mg | ORAL_TABLET | Freq: Every day | ORAL | Status: DC
  Administered 2023-02-10 – 2023-02-11 (×2): 300 mg via ORAL
  Filled 2023-02-10 (×2): qty 3

## 2023-02-10 MED ORDER — RIVAROXABAN 10 MG PO TABS
10.0000 mg | ORAL_TABLET | Freq: Every day | ORAL | Status: DC
  Administered 2023-02-10 – 2023-02-11 (×2): 10 mg via ORAL
  Filled 2023-02-10 (×2): qty 1

## 2023-02-10 MED ORDER — SODIUM CHLORIDE 0.9 % IV SOLN
1.0000 g | Freq: Once | INTRAVENOUS | Status: AC
  Administered 2023-02-10: 1 g via INTRAVENOUS
  Filled 2023-02-10: qty 10

## 2023-02-10 MED ORDER — AMIODARONE HCL 200 MG PO TABS
200.0000 mg | ORAL_TABLET | Freq: Every day | ORAL | Status: DC
  Administered 2023-02-10 – 2023-02-11 (×2): 200 mg via ORAL
  Filled 2023-02-10 (×2): qty 1

## 2023-02-10 MED ORDER — AZITHROMYCIN 250 MG PO TABS
500.0000 mg | ORAL_TABLET | Freq: Once | ORAL | Status: AC
  Administered 2023-02-10: 500 mg via ORAL
  Filled 2023-02-10: qty 2

## 2023-02-10 MED ORDER — TRAZODONE HCL 50 MG PO TABS
150.0000 mg | ORAL_TABLET | Freq: Every day | ORAL | Status: DC
  Administered 2023-02-11: 150 mg via ORAL
  Filled 2023-02-10: qty 3

## 2023-02-10 MED ORDER — AMOXICILLIN-POT CLAVULANATE 875-125 MG PO TABS
1.0000 | ORAL_TABLET | Freq: Two times a day (BID) | ORAL | Status: DC
  Administered 2023-02-10 – 2023-02-11 (×3): 1 via ORAL
  Filled 2023-02-10 (×3): qty 1

## 2023-02-10 MED ORDER — ASPIRIN 81 MG PO TBEC
81.0000 mg | DELAYED_RELEASE_TABLET | Freq: Every day | ORAL | Status: DC
  Administered 2023-02-10 – 2023-02-11 (×2): 81 mg via ORAL
  Filled 2023-02-10 (×2): qty 1

## 2023-02-10 MED ORDER — POTASSIUM CHLORIDE CRYS ER 20 MEQ PO TBCR
30.0000 meq | EXTENDED_RELEASE_TABLET | Freq: Once | ORAL | Status: AC
  Administered 2023-02-10: 30 meq via ORAL
  Filled 2023-02-10: qty 1

## 2023-02-10 MED ORDER — FUROSEMIDE 10 MG/ML IJ SOLN
40.0000 mg | Freq: Once | INTRAMUSCULAR | Status: AC
  Administered 2023-02-10: 40 mg via INTRAVENOUS
  Filled 2023-02-10: qty 4

## 2023-02-10 MED ORDER — ATORVASTATIN CALCIUM 10 MG PO TABS
20.0000 mg | ORAL_TABLET | Freq: Every day | ORAL | Status: DC
  Administered 2023-02-10 – 2023-02-11 (×2): 20 mg via ORAL
  Filled 2023-02-10 (×2): qty 2

## 2023-02-10 NOTE — ED Provider Notes (Signed)
  EMERGENCY DEPARTMENT AT [facility] Provider Note   CSN: [id number] Arrival date & time: 02/09/23  1233     History  Chief Complaint  Patient presents with   Chest Pain    Elishah Ashmore is a 58 y.o. male.  Patient with history of hypertension, CHF, CAD, MI presents today with complaints of shortness of breath.  He states that he has been compliant with his Lasix  regimen, however has had less output in the last few weeks and feels like he is retaining fluid.  States his abdomen is more distended than normal. He has been having more dyspnea on exertion over the last week with a worsening cough in the past few days. He has not been checking his temperature but states he has felt warm. States he has had tightness in his chest as well as his abdomen but suspects this is due to fluid overload.  Denies any specific pain.  No leg pain or leg swelling, states that he retains fluid in his abdomen and does not usually swelling in his legs.  The history is provided by the patient. No language interpreter was used.  Chest Pain Associated symptoms: cough and shortness of breath        Home Medications Prior to Admission medications   Medication Sig Start Date End Date Taking? Authorizing Provider  allopurinol  (ZYLOPRIM ) 300 MG tablet TAKE 1 TABLET EVERY DAY (NEED MD APPOINTMENT) 01/09/23   Fleming, Zelda W, NP  amiodarone  (PACERONE ) 200 MG tablet Take 1 tablet (200 mg total) by mouth daily. 01/18/23   Bensimhon, Toribio SAUNDERS, MD  aspirin  EC 81 MG tablet Take 81 mg by mouth daily. Swallow whole.    [provider]  atorvastatin  (LIPITOR) 20 MG tablet Take 1 tablet (20 mg total) by mouth daily. 01/18/23   Bensimhon, Toribio SAUNDERS, MD  empagliflozin  (JARDIANCE ) 10 MG TABS tablet Take 1 tablet (10 mg total) by mouth daily. 01/18/23   Bensimhon, Toribio SAUNDERS, MD  eplerenone  (INSPRA ) 25 MG tablet Take 1 tablet (25 mg total) by mouth daily. 01/22/23   Milford, Harlene HERO, FNP  furosemide   (LASIX ) 80 MG tablet MAY TAKE 1 TABLET AS NEEDED FOR SWELLING. APPOINTMENT NEEDED FOR REFILLS 01/09/23   Fleming, Zelda W, NP  losartan  (COZAAR ) 50 MG tablet Take 1 tablet (50 mg total) by mouth daily. 01/22/23   Milford, Harlene HERO, FNP  nitroGLYCERIN  (NITROSTAT ) 0.4 MG SL tablet Place 1 tablet (0.4 mg total) under the tongue every 5 (five) minutes for 3 doses as needed for chest pain. 09/29/22   Fleming, Zelda W, NP  traZODone  (DESYREL ) 150 MG tablet TAKE 1 TABLET AT BEDTIME 09/17/22   Fleming, Zelda W, NP      Allergies    Lisinopril, Allopurinol , Entresto  [sacubitril -valsartan ], Other, Shellfish allergy, and Spironolactone     Review of Systems   Review of Systems  HENT:  Positive for congestion.   Respiratory:  Positive for cough and shortness of breath.   Cardiovascular:  Positive for chest pain.  All other systems reviewed and are negative.   Physical Exam Updated Vital Signs BP (!) 165/92   Pulse 95   Temp 98.2 F (36.8 C) (Oral)   Resp 16   Wt 71.7 kg   SpO2 100%   BMI 26.29 kg/m  Physical Exam Vitals and nursing note reviewed.  Constitutional:      General: He is not in acute distress.    Appearance: Normal appearance. He is normal weight. He is not  ill-appearing, toxic-appearing or diaphoretic.  HENT:     Head: Normocephalic and atraumatic.  Cardiovascular:     Rate and Rhythm: Normal rate and regular rhythm.     Heart sounds: Normal heart sounds.  Pulmonary:     Effort: Pulmonary effort is normal. No respiratory distress.     Breath sounds: Normal breath sounds.  Abdominal:     General: There is distension.     Tenderness: There is no abdominal tenderness.  Musculoskeletal:        General: Normal range of motion.     Cervical back: Normal range of motion.     Right lower leg: No tenderness. No edema.     Left lower leg: No tenderness. No edema.  Skin:    General: Skin is warm and dry.  Neurological:     General: No focal deficit present.     Mental Status: He  is alert.  Psychiatric:        Mood and Affect: Mood normal.        Behavior: Behavior normal.     ED Results / Procedures / Treatments   Labs (all labs ordered are listed, but only abnormal results are displayed) Labs Reviewed  BRAIN NATRIURETIC PEPTIDE - Abnormal; Notable for the following components:      Result Value   B Natriuretic Peptide 1,195.9 (*)    All other components within normal limits  CBC WITH DIFFERENTIAL/PLATELET - Abnormal; Notable for the following components:   WBC 13.6 (*)    Neutro Abs 10.3 (*)    Monocytes Absolute 1.3 (*)    All other components within normal limits  COMPREHENSIVE METABOLIC PANEL - Abnormal; Notable for the following components:   Potassium 3.2 (*)    CO2 17 (*)    Glucose, Bld 108 (*)    Creatinine, Ser 1.47 (*)    Albumin 3.3 (*)    Total Bilirubin 2.4 (*)    GFR, Estimated 55 (*)    All other components within normal limits  TROPONIN I (HIGH SENSITIVITY) - Abnormal; Notable for the following components:   Troponin I (High Sensitivity) 65 (*)    All other components within normal limits  TROPONIN I (HIGH SENSITIVITY) - Abnormal; Notable for the following components:   Troponin I (High Sensitivity) 56 (*)    All other components within normal limits  RESP PANEL BY RT-PCR (RSV, FLU A&B, COVID)  [id number]    EKG EKG Interpretation Date/Time:  Tuesday February 09 2023 13:20:20 EST Ventricular Rate:  98 PR Interval:  150 QRS Duration:  150 QT Interval:  426 QTC Calculation: 543 R Axis:   -40  Text Interpretation: Sinus rhythm with occasional Premature ventricular complexes Left axis deviation Left bundle branch block Artifact Abnormal ECG When compared with ECG of 22-Jan-2023 15:54, No significant change was found Confirmed by Raford Lenis ([id number]) on 02/09/2023 11:38:36 PM  Radiology DG Chest 2 View Result Date: 02/09/2023 CLINICAL DATA:  Shortness of breath and chest pain EXAM: CHEST - 2 VIEW COMPARISON:  06/12/2022 FINDINGS:  Right-sided ascending stimulator generator. Left-sided single lead cardiac pacing device. Cardiomegaly. No pleural effusion. Hazy right infrahilar opacity IMPRESSION: Cardiomegaly. Hazy right infrahilar opacity, atelectasis versus mild pneumonia. Electronically Signed   By: Luke Bun M.D.   On: 02/09/2023 15:17    Procedures Procedures    Medications Ordered in ED Medications  cefTRIAXone  (ROCEPHIN ) 1 g in sodium chloride  0.9 % 100 mL IVPB (has no administration in time range)  furosemide  (LASIX ) injection  60 mg (60 mg Intravenous Given 02/10/23 1248)  azithromycin  (ZITHROMAX ) tablet 500 mg (500 mg Oral Given 02/10/23 1248)    ED Course/ Medical Decision Making/ A&P                                 Medical Decision Making Risk Prescription drug management.   This patient is a 58 y.o. male who presents to the ED for concern of cough, shortness of breath, this involves an extensive number of treatment options, and is a complaint that carries with it a high risk of complications and morbidity. The emergent differential diagnosis prior to evaluation includes, but is not limited to,  CHF, pericardial effusion/tamponade, arrhythmias, ACS, COPD, asthma, bronchitis, pneumonia, pneumothorax, PE, anemia  This is not an exhaustive differential.   Past Medical History / Co-morbidities / Social History:  has a past medical history of Acute renal failure (ARF) (HCC), Acute respiratory failure with hypoxia (HCC) (12/29/2018), Alcohol abuse, Anxiety, CAD (coronary artery disease), Chronic chest pain, Chronic systolic CHF (congestive heart failure) (HCC), Depression, Dyspnea, Family history of breast cancer (05/30/2021), Family history of pancreatic cancer (05/30/2021), Financial difficulties, Gout, Gouty arthritis, Headache, History of noncompliance with medical treatment, Hypertension, ICD (implantable cardioverter-defibrillator) in place, Insomnia, Lobar pneumonia (HCC) (12/28/2018), Myocardial infarction  (HCC) (2005), Nonischemic cardiomyopathy (HCC), Sleep apnea, and Ventricular tachycardia (HCC) (01/2015).  Additional history: Chart reviewed. Pertinent results include: most recent echo in 10/08/22 showed EF 30 - 35% with moderate to severe LV dysfunction  Physical Exam: Physical exam performed. The pertinent findings include: abdominal distention, maintaining on room air.  Lab Tests: I ordered, and personally interpreted labs.  The pertinent results include:  BNP 1,195.9, WBC 13.6, K 3.2, bicarb 17. Creatinine 1.47 up from 1.09 9 days ago. Troponin 65 --> 56, appears consistent with patients baseline   Imaging Studies: I ordered imaging studies including CXR. I independently visualized and interpreted imaging which showed   Cardiomegaly. Hazy right infrahilar opacity, atelectasis versus mild pneumonia.  I agree with the radiologist interpretation.   Cardiac Monitoring:  The patient was maintained on a cardiac monitor. Cardiac monitor showed an underlying rhythm of: no STEMI. I agree with this interpretation.   Medications: I ordered medication including Lasix , rocephin , azithromycin   for CHF exacerbation, CAP. Reevaluation of the patient after these medicines showed that the patient improved. I have reviewed the patients home medicines and have made adjustments as needed.  Disposition: After consideration of the diagnostic results and the patients response to treatment, I feel that patient will require admission for shortness of breath, CHF exacerbation, CAP.   Discussed patient with hospitalist Dr. Rosan who accepts patient for admission.  I discussed this case with my attending physician Dr. Ellouise who cosigned this note including patient's presenting symptoms, physical exam, and planned diagnostics and interventions. Attending physician stated agreement with plan or made changes to plan which were implemented.    Final Clinical Impression(s) / ED Diagnoses Final diagnoses:   Acute on chronic congestive heart failure, unspecified heart failure type (HCC)  Community acquired pneumonia of right lung, unspecified part of lung  Shortness of breath  AKI (acute kidney injury) [facility])    Rx / DC Orders ED Discharge Orders     None         Nora Lauraine LABOR, PA-C 02/10/23 1402    Ellouise Fine K, DO 02/10/23 1513

## 2023-02-10 NOTE — H&P (Signed)
 Date: 02/10/2023               Patient Name:  Stanley [name] MRN: [id number]  DOB: [birth year] Age / Sex: 58 y.o., male   PCP: Theotis Haze ORN, NP         Medical Service: Internal Medicine Teaching Service         Attending Physician: Dr. Rosan Dayton BROCKS, DO    First Contact: Dr. Norman Lobstein Pager: [phone]  Second Contact: Dr. Hadassah Kristy Ahr Pager: [phone]       After Hours (After 5p/  First Contact Pager: [phone]  weekends / holidays): Second Contact Pager: [phone]   Chief Complaint: shortness of breath  History of Present Illness: Stanley [name] is a 58 y.o. M with PMH anxiety/depression, CAD, hx MI, hx ventricular tachycardia, ICD in place, gout, HTN, insomnia, nonischemic cardiomyopathy who presents with shortness of breath admitted with acute CHF exacerbation and CAP.   He explains that he began having shortness of breath increased from baseline over the last several days. He was exposed to someone with acute illness Friday with symptom onset Saturday. He has had dry cough, sweats but no measured fever, shortness of breath. He has gained about 2 pounds over the last several days and notes his dyspnea is worse with exertion and he is severely limited in distance he can ambulate prior to feeling symptomatic (at this time walking from his bed to the sink in the room would cause symptoms; prior to acute illness he could ambulate from room 29 in ED to ED waiting room, for example). He has not had to increase his head elevation from 3-4 pillows overnight during this time. He has not increased the frequency of his lasix  dosing. He does not follow a strict diet or fluid restriction. He says he drinks about 2 bottles of water  daily and sips on ginger ale constantly at night. He has not had any sinus symptoms or GI symptoms. He has pain on the sides of his abdomen and in his chest with coughing, and this started when he began retaining fluid.   He has not taken his home  medications in about 4 days due to feeling poorly. He has not had an appetite and has not been eating or drinking well in this time, either.  Of note, echocardiogram 10/2022 showed global hypokinesis of the inferior wall, moderate-severe LV dysfunction, LV EF 30-35%, elevated LA pressure with mild dilation.  Pertinent labs include WBC 13.6 (neutrophils 10.3); K 3.2, CO2 17, Cr 1.47, total bilirubin 2.4, GFR 55; BNP 1195.9; troponin 65 > 56; negative for flu, RSV, COVID-19. She was given azithromycin  500 mg and ceftriaxone  1 g x1 dose, lasix  60 mg OV x 1 dose. IMTS paged for admission.  Past Medical History: Past Medical History:  Diagnosis Date   Acute renal failure (ARF) (HCC)    Acute respiratory failure with hypoxia (HCC) 12/29/2018   Alcohol abuse    Anxiety    CAD (coronary artery disease)    a. dx unclear, reported PCI in 2011 but cath 2014 normal coronaries and cardiac CT 07/2016 normal coronaries, no mention of stents.   Chronic chest pain    Chronic systolic CHF (congestive heart failure) (HCC)    Depression    Dyspnea    07/05/2019: per patient some times with exertion, has to catch breath   Family history of breast cancer 05/30/2021   Family history of pancreatic cancer 05/30/2021   Financial difficulties  Gout    Gouty arthritis    Headache    History of noncompliance with medical treatment    financial challenges   Hypertension    ICD (implantable cardioverter-defibrillator) in place    [employer] ICD implant done in [place] 2015   Insomnia    Lobar pneumonia (HCC) 12/28/2018   Myocardial infarction (HCC) 2005   Nonischemic cardiomyopathy (HCC)    Sleep apnea    per patient, has CPAP does not wear it every night   Ventricular tachycardia (HCC) 01/2015   2017 - ATP delivered for sinus tach, degenerated into VT for which he received shock. Recurrent VT 03/2018.   Past Surgical History: Past Surgical History:  Procedure Laterality Date   BAROREFLEX SYSTEM INSERTION      BIOPSY  03/06/2021   Procedure: BIOPSY;  Surgeon: Leigh Elspeth SQUIBB, MD;  Location: [facility];  Service: Gastroenterology;;   CARDIAC DEFIBRILLATOR PLACEMENT  06/05/2013   [employer] Inogen ICD implanted at [facility] in [place] for primary prevention   COLONOSCOPY WITH PROPOFOL  N/A 03/06/2021   Procedure: COLONOSCOPY WITH PROPOFOL ;  Surgeon: Leigh Elspeth SQUIBB, MD;  Location: [facility];  Service: Gastroenterology;  Laterality: N/A;   ESOPHAGEAL DILATION  03/06/2021   Procedure: ESOPHAGEAL DILATION;  Surgeon: Leigh Elspeth SQUIBB, MD;  Location: [facility];  Service: Gastroenterology;;   ESOPHAGOGASTRODUODENOSCOPY (EGD) WITH PROPOFOL  N/A 03/06/2021   Procedure: ESOPHAGOGASTRODUODENOSCOPY (EGD) WITH PROPOFOL ;  Surgeon: Leigh Elspeth SQUIBB, MD;  Location: [facility];  Service: Gastroenterology;  Laterality: N/A;   ESOPHAGOGASTRODUODENOSCOPY (EGD) WITH PROPOFOL  N/A 05/18/2021   Procedure: ESOPHAGOGASTRODUODENOSCOPY (EGD) WITH PROPOFOL ;  Surgeon: Legrand Victory LITTIE DOUGLAS, MD;  Location[facility] ENDOSCOPY;  Service: Gastroenterology;  Laterality: N/A;   HERNIA REPAIR     PERCUTANEOUS CORONARY STENT INTERVENTION (PCI-S)  01/05/2009   POLYPECTOMY  03/06/2021   Procedure: POLYPECTOMY;  Surgeon: Leigh Elspeth SQUIBB, MD;  Location: [facility];  Service: Gastroenterology;;   RIGHT HEART CATH N/A 06/11/2022   Procedure: RIGHT HEART CATH;  Surgeon: Rolan Ezra RAMAN, MD;  [facility] INVASIVE CV LAB;  Service: Cardiovascular;  Laterality: N/A;   Meds:  Current Meds  Medication Sig   allopurinol  (ZYLOPRIM ) 300 MG tablet TAKE 1 TABLET EVERY DAY (NEED MD APPOINTMENT)   amiodarone  (PACERONE ) 200 MG tablet Take 1 tablet (200 mg total) by mouth daily.   aspirin  EC 81 MG tablet Take 81 mg by mouth daily. Swallow whole.   furosemide  (LASIX ) 80 MG tablet MAY TAKE 1 TABLET AS NEEDED FOR SWELLING. APPOINTMENT NEEDED FOR REFILLS (Patient taking differently: Take 80 mg by mouth every 3 (three) days.)    nitroGLYCERIN  (NITROSTAT ) 0.4 MG SL tablet Place 1 tablet (0.4 mg total) under the tongue every 5 (five) minutes for 3 doses as needed for chest pain.   traZODone  (DESYREL ) 150 MG tablet TAKE 1 TABLET AT BEDTIME   Allergies: Allergies as of 02/09/2023 - Review Complete 02/09/2023  Allergen Reaction Noted   Lisinopril Shortness Of Breath 06/20/2015   Allopurinol  Nausea Only 10/28/2020   Entresto  [sacubitril -valsartan ]  12/06/2019   Other Swelling 07/03/2015   Shellfish allergy Swelling 06/20/2015   Spironolactone   08/24/2022   Family History:  Family History  Problem Relation Age of Onset   Breast cancer Mother    Pancreatic cancer Mother    Hypertension Father    Alzheimer's disease Father    Gout Father    Dementia Father    Hypertension Sister    Clotting disorder Sister    Obesity Brother  Bronchitis Brother    Cancer Maternal Uncle        unknown type; dx after 21; x2 maternal uncles   Breast cancer Paternal Aunt        dx after 45   Lung cancer Paternal Uncle    Heart disease Maternal Grandmother    Gout Paternal Grandfather    Colon polyps Neg Hx    Colon cancer Neg Hx    Esophageal cancer Neg Hx    Stomach cancer Neg Hx    Social History: Independent in ADLs and IADLs. Drinks wine on the weekend with dinner but quit cold-turkey heavy intake from June-September 2024. Denies tobacco or other substance use.  Review of Systems: A complete ROS was negative except as per HPI.   Physical Exam: Blood pressure (!) 165/92, pulse 95, temperature 98.2 F (36.8 C), temperature source Oral, resp. rate 16, weight 71.7 kg, SpO2 100%. Physical Exam Constitutional:      General: He is not in acute distress.    Appearance: He is ill-appearing. He is not toxic-appearing.  Cardiovascular:     Rate and Rhythm: Normal rate and regular rhythm.     Comments: No JVD appreciated Pulmonary:     Effort: No tachypnea, accessory muscle usage or respiratory distress.     Breath  sounds: Normal breath sounds.  Abdominal:     Palpations: Abdomen is soft. There is no fluid wave.     Tenderness: There is abdominal tenderness (with deep palpation). There is no guarding.     Comments: Distended  Musculoskeletal:     Right lower leg: No edema.     Left lower leg: No edema.  Skin:    General: Skin is warm and dry.  Neurological:     General: No focal deficit present.     Mental Status: He is alert and oriented to person, place, and time.  Psychiatric:        Mood and Affect: Mood normal. Mood is not anxious.        Behavior: Behavior normal.    EKG: NSR, occasional PVCs, LAD, LBBB, comparable to prior.  CXR: Cardiomegaly. Hazy right infrahilar opacity, atelectasis versus mild pneumonia.  Assessment & Plan by Problem: Principal Problem:   Acute exacerbation of CHF (congestive heart failure) (HCC)  HFrEF exacerbation Hx nonischemic cardiomyopathy Patient classically holds extra fluid in his abdomen and chest, consistent with current presentation. BNP significantly elevated as well. He has received lasix  from EDP with good UOP so far and reports that he is feeling better already. He has not been taking eplerenone  or jardiance . Plan: -Echocardiogram  -Strict I's&O's, daily weights -Trend renal function daily -Repeat BMP and check magnesium  this afternoon; replete K and Mg if indicated -Repeat IV Lasix  40 mg this afternoon, redose based on volume status 02/06 -Hold home empagliflozin  10 mg daily, eplerenone  25 mg daily  AKI Suspect this may be in the setting of CHF exacerbation, but may also be in setting of poor PO intake with acute illness. Plan: -Trend renal function with diuresis -Hold home losartan  50 mg daily  C/f CAP S/p 1x dose of azithromycin  and ceftriaxone  per EDP.  Leukocytosis with neutrophil predominance. Plan: -Continue azithromycin  500 mg PO for 2 additional days, start amoxicillin -clavulanate 875-125 mg BID for 5 days -F/u  RVP  Gout Plan: -Continue home allopurinol  300 mg daily  Hx MI Plan: -Continue ASA 81 mg daily, atorvastatin  20 mg daily  Hx ventricular tachycardia 2/2 metabolic derangements and intoxication.  Plan: -Continue  home amiodarone  200 mg daily  Insomnia Plan: -Continue home trazodone  160 mg daily at bedtime  Dispo: Admit patient to Observation with expected length of stay less than 2 midnights.  Signed: Addie Perkins, DO 02/10/2023, 2:04 PM  After 5pm on weekdays and 1pm on weekends: On Call pager: [phone]

## 2023-02-10 NOTE — ED Notes (Signed)
 Pt found sleeping outside when this Tech did a round of vitals reassessment

## 2023-02-10 NOTE — ED Notes (Signed)
 Upon coming to room pt stated his right arm felt numb. Lasted a few minutes an now back to normal.

## 2023-02-10 NOTE — Hospital Course (Addendum)
 Admitted 02/09/2023  Allergies: Lisinopril, Allopurinol , Entresto  [sacubitril -valsartan ], Other, Shellfish allergy, and Spironolactone  Pertinent Hx: CAD, hx MI, hx ventricular tachycardia, ICD in place, gout, HTN, insomnia, nonischemic cardiomyopathy   58 y.o. male p/w shortness of breath  *HFrEF exacerbation: Diuresing. On RA. Repeat echo ordered.   *AKI: 2/2 CHF vs poor PO intake vs both? Trending BMP   *C/f CAP: Azithromycin , augmentin . RVP pending.  Consults: None  Meds: see orders  VTE ppx: xarelto   IVF: none  Diet: HH

## 2023-02-10 NOTE — ED Notes (Signed)
Pt called for roll call with no answer

## 2023-02-11 ENCOUNTER — Observation Stay (HOSPITAL_COMMUNITY): Payer: Self-pay

## 2023-02-11 ENCOUNTER — Other Ambulatory Visit (HOSPITAL_COMMUNITY): Payer: Self-pay

## 2023-02-11 ENCOUNTER — Encounter (HOSPITAL_COMMUNITY): Payer: Self-pay | Admitting: Internal Medicine

## 2023-02-11 ENCOUNTER — Telehealth (HOSPITAL_COMMUNITY): Payer: Self-pay

## 2023-02-11 DIAGNOSIS — I5023 Acute on chronic systolic (congestive) heart failure: Secondary | ICD-10-CM

## 2023-02-11 DIAGNOSIS — J189 Pneumonia, unspecified organism: Secondary | ICD-10-CM

## 2023-02-11 DIAGNOSIS — I5022 Chronic systolic (congestive) heart failure: Secondary | ICD-10-CM

## 2023-02-11 DIAGNOSIS — N179 Acute kidney failure, unspecified: Secondary | ICD-10-CM

## 2023-02-11 LAB — CBC
HCT: 41 % (ref 39.0–52.0)
Hemoglobin: 13.9 g/dL (ref 13.0–17.0)
MCH: 33.3 pg (ref 26.0–34.0)
MCHC: 33.9 g/dL (ref 30.0–36.0)
MCV: 98.1 fL (ref 80.0–100.0)
Platelets: 200 10*3/uL (ref 150–400)
RBC: 4.18 MIL/uL — ABNORMAL LOW (ref 4.22–5.81)
RDW: 12.3 % (ref 11.5–15.5)
WBC: 12.6 10*3/uL — ABNORMAL HIGH (ref 4.0–10.5)
nRBC: 0 % (ref 0.0–0.2)

## 2023-02-11 LAB — BASIC METABOLIC PANEL
Anion gap: 14 (ref 5–15)
BUN: 23 mg/dL — ABNORMAL HIGH (ref 6–20)
CO2: 18 mmol/L — ABNORMAL LOW (ref 22–32)
Calcium: 8.7 mg/dL — ABNORMAL LOW (ref 8.9–10.3)
Chloride: 105 mmol/L (ref 98–111)
Creatinine, Ser: 1.7 mg/dL — ABNORMAL HIGH (ref 0.61–1.24)
GFR, Estimated: 46 mL/min — ABNORMAL LOW (ref >60)
Glucose, Bld: 99 mg/dL (ref 70–99)
Potassium: 3.6 mmol/L (ref 3.5–5.1)
Sodium: 137 mmol/L (ref 135–145)

## 2023-02-11 LAB — LIPID PANEL
Cholesterol: 212 mg/dL — ABNORMAL HIGH (ref 0–200)
HDL: 82 mg/dL (ref >40)
LDL Cholesterol: 109 mg/dL — ABNORMAL HIGH (ref 0–99)
Total CHOL/HDL Ratio: 2.6 {ratio}
Triglycerides: 107 mg/dL (ref <150)
VLDL: 21 mg/dL (ref 0–40)

## 2023-02-11 LAB — ECHOCARDIOGRAM COMPLETE
Area-P 1/2: 4.6 cm2
Calc EF: 27.2 %
MV M vel: 4.86 m/s
MV Peak grad: 94.5 mm[Hg]
Radius: 0.6 cm
S' Lateral: 5.9 cm
Single Plane A2C EF: 26.7 %
Single Plane A4C EF: 28 %
Weight: 2528 [oz_av]

## 2023-02-11 LAB — HIV ANTIBODY (ROUTINE TESTING W REFLEX): HIV Screen 4th Generation wRfx: NONREACTIVE

## 2023-02-11 MED ORDER — AMOXICILLIN-POT CLAVULANATE 875-125 MG PO TABS
1.0000 | ORAL_TABLET | Freq: Two times a day (BID) | ORAL | 0 refills | Status: DC
  Filled 2023-02-11: qty 7, 4d supply, fill #0

## 2023-02-11 MED ORDER — AZITHROMYCIN 500 MG PO TABS
500.0000 mg | ORAL_TABLET | Freq: Every day | ORAL | 0 refills | Status: DC
  Filled 2023-02-11: qty 1, 1d supply, fill #0

## 2023-02-11 MED ORDER — PERFLUTREN LIPID MICROSPHERE
1.0000 mL | INTRAVENOUS | Status: AC | PRN
  Administered 2023-02-11: 1 mL via INTRAVENOUS

## 2023-02-11 NOTE — Progress Notes (Signed)
 Heart Failure Navigator Progress Note  Assessed for Heart & Vascular TOC clinic readiness.  Patient does not meet criteria due to Advanced Heart Failure Team patient of Dr. Gala Romney. .   Navigator will sign off at this time.   Rhae Hammock, BSN, Scientist, clinical (histocompatibility and immunogenetics) Only

## 2023-02-11 NOTE — Telephone Encounter (Signed)
 Called and left patient a voice message  to confirm/remind patient of their appointment at the [facility] on 02/12/23.   And to bring in all medications and/or complete list.

## 2023-02-11 NOTE — Discharge Instructions (Addendum)
 It was a pleasure being a part of your care team.  You were admitted to the hospital for shortness of breath and cough that we suspect was caused by a combination of a heart failure exacerbation and potentially pneumonia.  Both of these were treated with medications that you will continue upon discharge.  There have been a lot of changes to your medications recently, so please follow-up with your heart failure appointment tomorrow to discuss exactly which medications you should be taking and potential cost saving programs to help in the future.  Please also follow-up with your PCP on 2/11.  I will put in my note a suggestion regarding your symptoms of depression and will recommend starting medication for this.

## 2023-02-11 NOTE — ED Notes (Signed)
 PT placement called, PT is being discharged. Upstairs bed is no longer needed.

## 2023-02-11 NOTE — ED Notes (Signed)
Patient returned back from ECHO.

## 2023-02-11 NOTE — ED Notes (Signed)
Echo @ bedside

## 2023-02-11 NOTE — ED Notes (Signed)
 PT given medication from pharmacy TOC. Instructions read, PT verbalized understanding of prescription

## 2023-02-11 NOTE — Discharge Summary (Addendum)
 Name: Stanley [name] MRN: [id number] DOB: 11-Jan-1965 58 y.o. PCP: Theotis Haze ORN, NP  Date of Admission: 02/09/2023  1:02 PM Date of Discharge: 02/11/2023  Attending Physician: Dr. CHARLENA Eastern  DISCHARGE DIAGNOSIS:  Primary Problem: Acute exacerbation of CHF (congestive heart failure) [facility])   Hospital Problems: Principal Problem:   Acute exacerbation of CHF (congestive heart failure) (HCC)    DISCHARGE MEDICATIONS:   Allergies as of 02/11/2023       Reactions   Lisinopril Shortness Of Breath   Makes lips swell   Allopurinol  Nausea Only   dizziness   Entresto  [sacubitril -valsartan ] Other (See Comments)   History of angioedema   Other Swelling   Nuts - Lip Swelling   Shellfish Allergy Swelling   Lip Swelling   Spironolactone  Other (See Comments)   Breast tenderness        Medication List     PAUSE taking these medications    digoxin  0.125 MG tablet Wait to take this until your doctor or other care provider tells you to start again. Commonly known as: LANOXIN  Take by mouth.   eplerenone  25 MG tablet Wait to take this until your doctor or other care provider tells you to start again. Commonly known as: INSPRA  Take 1 tablet (25 mg total) by mouth daily.   furosemide  80 MG tablet Wait to take this until your doctor or other care provider tells you to start again. Commonly known as: LASIX  MAY TAKE 1 TABLET AS NEEDED FOR SWELLING. APPOINTMENT NEEDED FOR REFILLS   losartan  50 MG tablet Wait to take this until your doctor or other care provider tells you to start again. Commonly known as: COZAAR  Take 1 tablet (50 mg total) by mouth daily.       TAKE these medications    allopurinol  300 MG tablet Commonly known as: ZYLOPRIM  TAKE 1 TABLET EVERY DAY (NEED MD APPOINTMENT)   amiodarone  200 MG tablet Commonly known as: PACERONE  Take 1 tablet (200 mg total) by mouth daily.   amoxicillin -clavulanate 875-125 MG tablet Commonly known as: AUGMENTIN  Take 1  tablet by mouth every 12 (twelve) hours.   aspirin  EC 81 MG tablet Take 81 mg by mouth daily. Swallow whole.   atorvastatin  20 MG tablet Commonly known as: LIPITOR Take 1 tablet (20 mg total) by mouth daily.   azithromycin  500 MG tablet Commonly known as: ZITHROMAX  Take 1 tablet (500 mg total) by mouth daily. Start taking on: February 12, 2023   empagliflozin  10 MG Tabs tablet Commonly known as: Jardiance  Take 1 tablet (10 mg total) by mouth daily.   nitroGLYCERIN  0.4 MG SL tablet Commonly known as: NITROSTAT  Place 1 tablet (0.4 mg total) under the tongue every 5 (five) minutes for 3 doses as needed for chest pain.   traZODone  150 MG tablet Commonly known as: DESYREL  TAKE 1 TABLET AT BEDTIME        DISPOSITION AND FOLLOW-UP:  [name] was discharged from [facility] in Stable condition. At the hospital follow up visit please address:  HFrEF exacerbation Hx nonischemic cardiomyopathy Consider options for medication discounts to improve compliance. Re-evaluate medications as there have been many changes and patient is confused as to what he should and should not be taking.  AKI Repeat BMP and consider UA.  Concern for CAP Ensure symptoms improvement and Abx compliance.   HOSPITAL COURSE:  Patient Summary:  Stanley [name] is a 58 y.o. male with HFrEF, PMH, CAD, hx MI, hx ventricular tachycardia, ICD in place,  gout, HTN, insomnia, nonischemic cardiomyopathy who presented to ED with dyspnea.   HFrEF exacerbation Hx nonischemic cardiomyopathy Patient had not taken GDMT leading up to admission. BNP elevated at 1,196.  Repeat TTE showed worsening EF of 25-30% and mild-moderate mitral valve regurgitation. Patient treated with IV Lasix  with improvement in SOB and volume overload which he normally carries centrally. Patient has financial barriers to medication compliance so options will be discussed at heart failure appointment 2/7.     AKI Suspect 2/2 to above. Baseline creatinine ~ 1.2 and was 1.47 on admission and 1.7 s/p 100 mg IV Lasix . Patient denies urinary symptoms and had good output. Renal US  ordered which was unremarkable. Unfortunately patient was discharged prior to UA and repeat BMP. These labs will be suggested at 2/7 follow-up.    CAP Presented afebrile but with neutrophil predominant leukocytosis of 13.6. Symptoms were SOB and mild wet cough without expectoration. CXR showed cardiomegaly. Hazy right infrahilar opacity, atelectasis versus mild pneumonia. Procalcitonin was 0.27.  RVP negative. Patient received IV Rocephin  in ED and was continued on Augmentin  to complete 5-day course upon discharge.  Also started on azithromycin  and will complete 3-day course upon discharge.    Discharge Instructions     Call MD for:  difficulty breathing, headache or visual disturbances   Complete by: As directed    Call MD for:  extreme fatigue   Complete by: As directed    Call MD for:  hives   Complete by: As directed    Call MD for:  persistant dizziness or light-headedness   Complete by: As directed    Call MD for:  persistant nausea and vomiting   Complete by: As directed    Call MD for:  redness, tenderness, or signs of infection (pain, swelling, redness, odor or green/yellow discharge around incision site)   Complete by: As directed    Call MD for:  severe uncontrolled pain   Complete by: As directed    Call MD for:  temperature >100.4   Complete by: As directed    Diet - low sodium heart healthy   Complete by: As directed    Increase activity slowly   Complete by: As directed        SUBJECTIVE:  SOB/cough improved. Feels comfortable with discharge. Discharge Vitals:   BP 124/83   Pulse 78   Temp 98.6 F (37 C) (Oral)   Resp 16   Ht 5' 5 (1.651 m)   Wt 71.7 kg   SpO2 97%   BMI 26.30 kg/m   OBJECTIVE:  Physical Exam:  Constitutional: well-appearing, lying in bed, in no acute  distress Cardiovascular: regular rate and rhythm, no m/r/g, no LEE Pulmonary/Chest: normal work of breathing on room air, lungs clear to auscultation bilaterally Abdominal: soft, non-tender, non-distended MSK: normal bulk and tone Skin: warm and dry Psych: normal mood and behavior  Pertinent Labs, Studies, and Procedures:     Latest Ref Rng & Units 02/11/2023    5:17 AM 02/09/2023    1:15 PM 10/26/2022   11:48 AM  CBC  WBC 4.0 - 10.5 K/uL 12.6  13.6  11.2   Hemoglobin 13.0 - 17.0 g/dL 86.0  85.6  85.0   Hematocrit 39.0 - 52.0 % 41.0  42.1  45.1   Platelets 150 - 400 K/uL 200  169  187        Latest Ref Rng & Units 02/11/2023    5:17 AM 02/10/2023    9:10 PM 02/09/2023  1:15 PM  CMP  Glucose 70 - 99 mg/dL 99  893  891   BUN 6 - 20 mg/dL 23  17  11    Creatinine 0.61 - 1.24 mg/dL 8.29  8.49  8.52   Sodium 135 - 145 mmol/L 137  134  138   Potassium 3.5 - 5.1 mmol/L 3.6  3.4  3.2   Chloride 98 - 111 mmol/L 105  103  109   CO2 22 - 32 mmol/L 18  19  17    Calcium  8.9 - 10.3 mg/dL 8.7  8.9  8.9   Total Protein 6.5 - 8.1 g/dL   7.4   Total Bilirubin 0.0 - 1.2 mg/dL   2.4   Alkaline Phos 38 - 126 U/L   63   AST 15 - 41 U/L   36   ALT 0 - 44 U/L   27     DG Chest 2 View Result Date: 02/09/2023 CLINICAL DATA:  Shortness of breath and chest pain EXAM: CHEST - 2 VIEW COMPARISON:  06/12/2022 FINDINGS: Right-sided ascending stimulator generator. Left-sided single lead cardiac pacing device. Cardiomegaly. No pleural effusion. Hazy right infrahilar opacity IMPRESSION: Cardiomegaly. Hazy right infrahilar opacity, atelectasis versus mild pneumonia. Electronically Signed   By: Luke Bun M.D.   On: 02/09/2023 15:17     Signed: Norman Lobstein, DO  Internal Medicine Resident, PGY-1 [facility]  Pager: [phone] 5:07 PM, 02/11/2023

## 2023-02-11 NOTE — ED Notes (Signed)
Patient was given a turkey sandwich bag. 

## 2023-02-12 ENCOUNTER — Telehealth (HOSPITAL_COMMUNITY): Payer: Self-pay | Admitting: Pharmacy Technician

## 2023-02-12 ENCOUNTER — Ambulatory Visit (HOSPITAL_COMMUNITY)
Admission: RE | Admit: 2023-02-12 | Discharge: 2023-02-12 | Disposition: A | Discharge status: Home / Self Care | Payer: Self-pay | Source: Ambulatory Visit | Attending: Family Medicine | Admitting: Family Medicine

## 2023-02-12 ENCOUNTER — Other Ambulatory Visit (HOSPITAL_COMMUNITY): Payer: Self-pay

## 2023-02-12 VITALS — BP 100/58 | HR 84 | Wt 164.4 lb

## 2023-02-12 DIAGNOSIS — G4733 Obstructive sleep apnea (adult) (pediatric): Secondary | ICD-10-CM | POA: Insufficient documentation

## 2023-02-12 DIAGNOSIS — I509 Heart failure, unspecified: Secondary | ICD-10-CM

## 2023-02-12 DIAGNOSIS — I2510 Atherosclerotic heart disease of native coronary artery without angina pectoris: Secondary | ICD-10-CM | POA: Insufficient documentation

## 2023-02-12 DIAGNOSIS — Z7984 Long term (current) use of oral hypoglycemic drugs: Secondary | ICD-10-CM | POA: Insufficient documentation

## 2023-02-12 DIAGNOSIS — Z955 Presence of coronary angioplasty implant and graft: Secondary | ICD-10-CM | POA: Insufficient documentation

## 2023-02-12 DIAGNOSIS — I472 Ventricular tachycardia, unspecified: Secondary | ICD-10-CM | POA: Insufficient documentation

## 2023-02-12 DIAGNOSIS — N179 Acute kidney failure, unspecified: Secondary | ICD-10-CM | POA: Insufficient documentation

## 2023-02-12 DIAGNOSIS — F1010 Alcohol abuse, uncomplicated: Secondary | ICD-10-CM | POA: Insufficient documentation

## 2023-02-12 DIAGNOSIS — R0789 Other chest pain: Secondary | ICD-10-CM | POA: Insufficient documentation

## 2023-02-12 DIAGNOSIS — F32A Depression, unspecified: Secondary | ICD-10-CM | POA: Insufficient documentation

## 2023-02-12 DIAGNOSIS — Z91148 Patient's other noncompliance with medication regimen for other reason: Secondary | ICD-10-CM | POA: Insufficient documentation

## 2023-02-12 DIAGNOSIS — Z79899 Other long term (current) drug therapy: Secondary | ICD-10-CM | POA: Insufficient documentation

## 2023-02-12 DIAGNOSIS — E876 Hypokalemia: Secondary | ICD-10-CM | POA: Insufficient documentation

## 2023-02-12 DIAGNOSIS — I700 Atherosclerosis of aorta: Secondary | ICD-10-CM

## 2023-02-12 DIAGNOSIS — I130 Hypertensive heart and chronic kidney disease with heart failure and stage 1 through stage 4 chronic kidney disease, or unspecified chronic kidney disease: Secondary | ICD-10-CM | POA: Insufficient documentation

## 2023-02-12 DIAGNOSIS — I5022 Chronic systolic (congestive) heart failure: Secondary | ICD-10-CM | POA: Insufficient documentation

## 2023-02-12 LAB — BRAIN NATRIURETIC PEPTIDE: B Natriuretic Peptide: 87.5 pg/mL (ref 0.0–100.0)

## 2023-02-12 LAB — BASIC METABOLIC PANEL
Anion gap: 10 (ref 5–15)
BUN: 26 mg/dL — ABNORMAL HIGH (ref 6–20)
CO2: 20 mmol/L — ABNORMAL LOW (ref 22–32)
Calcium: 9.1 mg/dL (ref 8.9–10.3)
Chloride: 109 mmol/L (ref 98–111)
Creatinine, Ser: 1.95 mg/dL — ABNORMAL HIGH (ref 0.61–1.24)
GFR, Estimated: 39 mL/min — ABNORMAL LOW (ref >60)
Glucose, Bld: 95 mg/dL (ref 70–99)
Potassium: 3.7 mmol/L (ref 3.5–5.1)
Sodium: 139 mmol/L (ref 135–145)

## 2023-02-12 MED ORDER — FUROSEMIDE 40 MG PO TABS: 40.0000 mg | ORAL_TABLET | ORAL | 3 refills | Status: DC | PRN

## 2023-02-12 MED ORDER — EMPAGLIFLOZIN 10 MG PO TABS: 10.0000 mg | ORAL_TABLET | Freq: Every day | ORAL | 3 refills | Status: DC

## 2023-02-12 NOTE — Addendum Note (Signed)
 Encounter addended by: Dearion Huot M, CMA on: 02/12/2023 4:03 PM  Actions taken: Visit diagnoses modified, Order list changed, Diagnosis association updated

## 2023-02-12 NOTE — Telephone Encounter (Signed)
 Advanced Heart Failure Patient Advocate Encounter  The patient was approved for a [employer] grant that will help cover the cost of Jardiance , Eplerenone  and Losartan . Total amount awarded, $10,000. Eligibility, 01/13/23 - 01/12/24.  ID [id number]  BIN [id number]  PCN [id number]  Group [id number]  Patient provided copy while in clinic.  Will forward Jenna (CSW) request to help the patient apply for LIS.  Almarie JULIANNA Pa, CPhT

## 2023-02-12 NOTE — Addendum Note (Signed)
 Encounter addended by: Nida Barrow, RN on: 02/12/2023 2:16 PM  Actions taken: Clinical Note Signed, Order list changed, Diagnosis association updated, Charge Capture section accepted

## 2023-02-12 NOTE — Patient Instructions (Addendum)
 Thank you for coming in today  If you had labs drawn today, any labs that are abnormal the clinic will call you No news is good news  Medications: Restart Jardiacnce 10 mg 1 tablet daily Lasix  40 mg as needed For weight gain of 3 lbs in 24 hours or 5 lbs in a week   Follow up appointments:  Your physician recommends that you schedule a follow-up appointment in:  2 weeks in clinic   Do the following things EVERYDAY: Weigh yourself in the morning before breakfast. Write it down and keep it in a log. Take your medicines as prescribed Eat low salt foods--Limit salt (sodium) to 2000 mg per day.  Stay as active as you can everyday Limit all fluids for the day to less than 2 liters   At the [facility], you and your health needs are our priority. As part of our continuing mission to provide you with exceptional heart care, we have created designated Provider Care Teams. These Care Teams include your primary Cardiologist (physician) and Advanced Practice Providers (APPs- Physician Assistants and Nurse Practitioners) who all work together to provide you with the care you need, when you need it.   You may see any of the following providers on your designated Care Team at your next follow up: Dr Toribio Fuel Dr Ezra Shuck Dr. Ria Gardenia Greig Lenetta, NP Caffie Shed, [place] [facility] [place], [place] Beckey Coe, NP Tinnie Redman, PharmD   Please be sure to bring in all your medications bottles to every appointment.    Thank you for choosing [facility]  If you have any questions or concerns before your next appointment please send us  a message through [place] or call our office at [phone].    TO LEAVE A MESSAGE FOR THE NURSE SELECT OPTION 2, PLEASE LEAVE A MESSAGE INCLUDING: YOUR NAME DATE OF BIRTH CALL BACK NUMBER REASON FOR CALL**this is important as we prioritize the call backs  YOU WILL RECEIVE  A CALL BACK THE SAME DAY AS LONG AS YOU CALL BEFORE 4:00 PM

## 2023-02-12 NOTE — Progress Notes (Signed)
 ADVANCED HF CLINIC NOTE  PCP: Theotis Haze ORN, NP EP: Dr. Cindie HF Cardiologist: Dr. Cherrie  CC: HF follow up  HPI: Stanley [name] is a 58 y.o. male with systolic HF due to NICM d/x'd in 2006 (felt 2/2 HTN/ETOH), ? CAD s/p PCI to unknown vessel 2011 at outside location (cath 2014 without CAD and normal coronaries by CT 07/2016), VT s/p [facility] ICD implant ([place] 2015), noncompliance, chronic noncardiac chest pain, depression, HTN and ETOH abuse.    In 2017 he received inappropriate therapy for ST with ATP (after a work out at [employer] and reported noncompliance with meds) that degenerated into VT and received several shocks. He has had issues with chronic chest pain without evidence for ischemic etiology including normal cath and cardiac CT as above. This is relatively unchanged. Echo 03/2016  EF 35-40%, grade 1 DD, mild MR, moderate LAE  Had recurrent ICD shock in 3/20 in setting of medicine noncompliance. Amio continued. Echo 3/20 EF 25-30% Normal RV.   Echo 3/21: EF 25-30% RV ok S/p Barostim 7/21 Myoview  5/22: EF 34% fixed inferior defect  Admitted 6/24 with recurrent VT. 2/2 intoxication and metabolic derangement (K 3.1, Mag 1.2). Started on amiodarone  gtt. Echo showed EF < 20%, RV mildly reduced. Required pressors and ICU monitoring due to worsening respiratory failure and ETOH withdrawal. Underwent RHC showing elevated RA pressures, normal PCWP, mild to moderate PAH and decreased output with CI (Thermo) 1.7. DBA gtt started. Drips weaned and amio transitioned to oral. GDMT titrated, but limited by AKI. He was discharged home, weight 136 lbs.  Echo 10/08/22 EF 30-35% RV ok. Mild to moderate MR   Seen in ED 02/10/23 with a/c HF & CAP. Echo showed EF 25-30%, G1DD, normal RV, mild to moderate MR. Diuresed with IV lasix  and treated with abx. Dig, losartan , lasix  and Inspra  held with elevated SCr of 1.7  Today he returns for HF follow up. Overall feeling fine. Still has SOB walking  on flat ground, has a few more days of abx left. Has chest tightness. Denies palpitations, abnormal bleeding, dizziness, edema, or PND/Orthopnea. Appetite ok. No fever or chills. Weight at home 161 pounds. Taking all medications. Drinks wine on weekends.   Cardiac Studies - Echo 2/25 EF 25-30%, G1DD, normal RV, mild to moderate MR  - Echo (10/24): EF 30-35%, RV ok, mild to moderate MR  - RHC (6/24): RA mean 10, PA 50/23 (mean 34), PCWP mean 11, CO/CI (Fick) 3.87/2.25, CO/CI (thermo) 2.92/1.78,  PVR 5.9  WU, PAPi 2.7  - Echo (6/24): EF < 20%, grade I DD, RV mildly reduced, moderate MR, moderate to severe TR  - Myoview  (5/22): EF 34% fixed inferior defect  - S/p Barostim (7/21)  - Echo (3/21): EF 25-30% RV ok  - CPX (09/19/18) FVC 1.96 (57%)      FEV1 1.60 (58%)        FEV1/FVC 81 (101%)        MVV 80 (61%)  Resting HR: 77 Peak HR: 131   (79% age predicted max HR)  BP rest: 168/80          Standing BP: 156/74 BP peak: 196/80   Peak VO2: 14.9 (52% predicted peak VO2)  VE/VCO2 slope:  31  OUES: 1.43  Peak RER: 1.05  VE/MVV:  50% O2pulse:  10   (67% predicted O2pulse)  Moderate to severe HF limitation but normal VeVCO2 slope is reassuring. Restrictive lung physiology.   - Echo (3/20) EF  25-30% Normal RV.   - Echo (3/18):  EF 35-40%, grade 1 DD, mild MR, moderate LAE  Past Medical History:  Diagnosis Date   Acute renal failure (ARF) (HCC)    Acute respiratory failure with hypoxia (HCC) 12/29/2018   Alcohol abuse    Anxiety    CAD (coronary artery disease)    a. dx unclear, reported PCI in 2011 but cath 2014 normal coronaries and cardiac CT 07/2016 normal coronaries, no mention of stents.   Chronic chest pain    Chronic systolic CHF (congestive heart failure) (HCC)    Depression    Dyspnea    07/05/2019: per patient some times with exertion, has to catch breath   Family history of breast cancer 05/30/2021   Family history of pancreatic cancer 05/30/2021   Financial  difficulties    Gout    Gouty arthritis    Headache    History of noncompliance with medical treatment    financial challenges   Hypertension    ICD (implantable cardioverter-defibrillator) in place    [employer] ICD implant done in [place] 2015   Insomnia    Lobar pneumonia (HCC) 12/28/2018   Myocardial infarction (HCC) 2005   Nonischemic cardiomyopathy (HCC)    Sleep apnea    per patient, has CPAP does not wear it every night   Ventricular tachycardia (HCC) 01/2015   2017 - ATP delivered for sinus tach, degenerated into VT for which he received shock. Recurrent VT 03/2018.   Current Outpatient Medications  Medication Sig Dispense Refill   allopurinol  (ZYLOPRIM ) 300 MG tablet TAKE 1 TABLET EVERY DAY (NEED MD APPOINTMENT) 30 tablet 11   amiodarone  (PACERONE ) 200 MG tablet Take 1 tablet (200 mg total) by mouth daily. 90 tablet 1   amoxicillin -clavulanate (AUGMENTIN ) 875-125 MG tablet Take 1 tablet by mouth every 12 (twelve) hours. 7 tablet 0   aspirin  EC 81 MG tablet Take 81 mg by mouth daily. Swallow whole.     atorvastatin  (LIPITOR) 20 MG tablet Take 1 tablet (20 mg total) by mouth daily. 90 tablet 3   nitroGLYCERIN  (NITROSTAT ) 0.4 MG SL tablet Place 1 tablet (0.4 mg total) under the tongue every 5 (five) minutes for 3 doses as needed for chest pain. 75 each 6   traZODone  (DESYREL ) 150 MG tablet TAKE 1 TABLET AT BEDTIME 90 tablet 3   azithromycin  (ZITHROMAX ) 500 MG tablet Take 1 tablet (500 mg total) by mouth daily. (Patient not taking: Reported on 02/12/2023) 1 tablet 0   [Paused] digoxin  (LANOXIN ) 0.125 MG tablet Take by mouth. (Patient not taking: Reported on 02/10/2023)     empagliflozin  (JARDIANCE ) 10 MG TABS tablet Take 1 tablet (10 mg total) by mouth daily. (Patient not taking: Reported on 02/10/2023) 90 tablet 3   [Paused] eplerenone  (INSPRA ) 25 MG tablet Take 1 tablet (25 mg total) by mouth daily. (Patient not taking: Reported on 02/10/2023) 90 tablet 1   [Paused] furosemide  (LASIX ) 80 MG  tablet MAY TAKE 1 TABLET AS NEEDED FOR SWELLING. APPOINTMENT NEEDED FOR REFILLS (Patient not taking: Reported on 02/12/2023) 90 tablet 1   [Paused] losartan  (COZAAR ) 50 MG tablet Take 1 tablet (50 mg total) by mouth daily. (Patient not taking: Reported on 02/10/2023) 30 tablet 6   No current facility-administered medications for this encounter.   Allergies  Allergen Reactions   Lisinopril Shortness Of Breath    Makes lips swell   Allopurinol  Nausea Only    dizziness   Entresto  [Sacubitril -Valsartan ] Other (See Comments)  History of angioedema   Other Swelling    Nuts - Lip Swelling   Shellfish Allergy Swelling    Lip Swelling   Spironolactone  Other (See Comments)    Breast tenderness   Social History   Socioeconomic History   Marital status: Divorced    Spouse name: Not on file   Number of children: 1   Years of education: 12   Highest education level: Not on file  Occupational History   Occupation: Disability  Tobacco Use   Smoking status: Never   Smokeless tobacco: Never  Vaping Use   Vaping status: Never Used  Substance and Sexual Activity   Alcohol use: Not Currently    Comment: Last drink before 06/08/2022   Drug use: No   Sexual activity: Not Currently  Other Topics Concern   Not on file  Social History Narrative   Lives in [place] alone.  Disabled.  Previously worked as a geophysicist/field seismologist.   Fun: Rest, Read and watch movies.    Social Drivers of Health   Financial Resource Strain: High Risk (06/30/2022)   Overall Financial Resource Strain (CARDIA)    Difficulty of Paying Living Expenses: Hard  Food Insecurity: Food Insecurity Present (02/11/2023)   Hunger Vital Sign    Worried About Running Out of Food in the Last Year: Sometimes true    Ran Out of Food in the Last Year: Sometimes true  Transportation Needs: Unmet Transportation Needs (02/11/2023)   PRAPARE - Administrator, Civil Service (Medical): Yes    Lack of Transportation (Non-Medical):  No  Physical Activity: Unknown (03/21/2018)   Exercise Vital Sign    Days of Exercise per Week: Patient declined    Minutes of Exercise per Session: Patient declined  Stress: Stress Concern Present (03/21/2018)   [employer] of Occupational Health - Occupational Stress Questionnaire    Feeling of Stress : To some extent  Social Connections: Unknown (03/21/2018)   Social Connection and Isolation Panel [NHANES]    Frequency of Communication with Friends and Family: Patient declined    Frequency of Social Gatherings with Friends and Family: Patient declined    Attends Religious Services: Patient declined    Database Administrator or Organizations: Patient declined    Attends Banker Meetings: Patient declined    Marital Status: Patient declined  Intimate Partner Violence: Not At Risk (02/11/2023)   Humiliation, Afraid, Rape, and Kick questionnaire    Fear of Current or Ex-Partner: No    Emotionally Abused: No    Physically Abused: No    Sexually Abused: No   Family History  Problem Relation Age of Onset   Breast cancer Mother    Pancreatic cancer Mother    Hypertension Father    Alzheimer's disease Father    Gout Father    Dementia Father    Hypertension Sister    Clotting disorder Sister    Obesity Brother    Bronchitis Brother    Cancer Maternal Uncle        unknown type; dx after 64; x2 maternal uncles   Breast cancer Paternal Aunt        dx after 50   Lung cancer Paternal Uncle    Heart disease Maternal Grandmother    Gout Paternal Grandfather    Colon polyps Neg Hx    Colon cancer Neg Hx    Esophageal cancer Neg Hx    Stomach cancer Neg Hx    BP (!) 100/58  Pulse 84   Wt 74.6 kg (164 lb 6.4 oz)   SpO2 97%   BMI 27.36 kg/m   Wt Readings from Last 3 Encounters:  02/12/23 74.6 kg (164 lb 6.4 oz)  02/11/23 71.7 kg (158 lb 1.1 oz)  01/22/23 76.6 kg (168 lb 12.8 oz)   PHYSICAL EXAM: General:  NAD. No resp difficulty HEENT: Normal Neck: Supple.  JVP 8-10 Cor: Regular rate & rhythm. No rubs, gallops, 2/6 SEM Lungs: Clear Abdomen: Soft, nontender, nondistended.  Extremities: No cyanosis, clubbing, rash, edema Neuro: Alert & oriented x 3, moves all 4 extremities w/o difficulty. Affect pleasant.  ReDs reading: 26 %, normal  ECG (personally reviewed): NSR 79 bpm, barostim artifact  Device interrogation (personally reviewed): No VT. Activity 0.8 hr/day, average HR 84 bpm   ASSESSMENT & PLAN:  1. VT  - Shocks from [employer] ICD (06/08/22), 2/2 hypomag and hypokalemia, ETOH abuse and medication noncompliance..  - Continue amiodarone  per EP - No recurrent VT  - Device interrogation as above - Amio labs (9/24) ok  2. Chronic systolic CHF - Mostly NICM (? H/o PCI to unknown vessel in 2011.) CTA 2018 normal cors - s/p [employer] ICD - Echo (3/20): EF 25-30% - CPX 9/20 moderate to severe HF limitation. pVO2: 14.9 (52% predicted peak VO2).  slope: 31  pRER: 1.05  - Echo (03/10/19): EF 25-30% RV ok Mild MR - Myoview  (5/22): EF 34% fixed inferior defect - Echo (6/24): EF < 20%, RV mildly reduced (in setting of VT), moderate-severe TR, moderate MR.  - RHC (6/24): RA 10, PCWP 11, CO/CI (Thermo) 2.9/1.78. >>started on DBA - CMP likely 2/2 ETOH -> stopped drinking in 6/24  - Echo (10/24): EF 30-35%  - Echo 2/25 EF 25-30%, G1DD, normal RV, mild to moderate MR - NYHA IIb, Volume OK today, Reds normal at 26%. GDMT limited by recent AKI and BP. - Restart Jardiance  10 mg daily - Will give Rx for Lasix  40 mg PRN - Add back losartan  (ACEi allergy) and Inspra  next. - Labs today.  3. ETOH abuse - Heavy ETOH use, likely contributes to cardiomyopathy.  - Has cut way back since d/c.  - Congratulated  4. CKD 3  - Baseline SCr 1.3-1.6 - Recent SCr up to 1.7 - Restart SGLT2i as above - Labs today  5. HTN - BP stable today - Plan to add back GDMT as able  6. CAD - h/o single vessel PCI - Myoview  (5/22): EF 34% fixed inferior defect -  Recent chest tightness, suspect due to volume - Continue ASA + statin  7. OSA - Unable to tolerate CPAP - No change  Follow up in 2-3 weeks with APP (add back GDMT)  Harlene CHRISTELLA Gainer, FNP  1:45 PM

## 2023-02-12 NOTE — Progress Notes (Signed)
 ReDS Vest / Clip - 02/12/23 1300       ReDS Vest / Clip   Station Marker A    Ruler Value 29    ReDS Value Range Low volume    ReDS Actual Value 26

## 2023-02-15 ENCOUNTER — Telehealth (HOSPITAL_COMMUNITY): Payer: Self-pay

## 2023-02-15 DIAGNOSIS — I5022 Chronic systolic (congestive) heart failure: Secondary | ICD-10-CM

## 2023-02-15 NOTE — Telephone Encounter (Signed)
-----   Message from Elmarie Hacking sent at 02/12/2023  4:23 PM EST ----- Renal function remains elevated.   Do NOT restart Jardiance  as discussed at visit today. Repeat BMET in 1 week

## 2023-02-15 NOTE — Telephone Encounter (Signed)
 Patient's lab placed and appointment scheduled. Pt aware, agreeable, and verbalized understanding.

## 2023-02-16 ENCOUNTER — Telehealth (HOSPITAL_COMMUNITY): Payer: Self-pay | Admitting: Licensed Clinical Social Worker

## 2023-02-16 ENCOUNTER — Encounter: Payer: Self-pay | Admitting: Nurse Practitioner

## 2023-02-16 ENCOUNTER — Ambulatory Visit: Payer: Self-pay | Attending: Nurse Practitioner | Admitting: Nurse Practitioner

## 2023-02-16 VITALS — BP 117/65 | HR 81 | Resp 19 | Ht 65.0 in | Wt 175.4 lb

## 2023-02-16 DIAGNOSIS — Z7984 Long term (current) use of oral hypoglycemic drugs: Secondary | ICD-10-CM

## 2023-02-16 DIAGNOSIS — Z09 Encounter for follow-up examination after completed treatment for conditions other than malignant neoplasm: Secondary | ICD-10-CM

## 2023-02-16 DIAGNOSIS — D72829 Elevated white blood cell count, unspecified: Secondary | ICD-10-CM

## 2023-02-16 DIAGNOSIS — R7989 Other specified abnormal findings of blood chemistry: Secondary | ICD-10-CM

## 2023-02-16 NOTE — Telephone Encounter (Signed)
CSW following up on request to assist with additional insurance options/LIS for patient. CSW contacted patient and left message for return call. Lasandra Beech, LCSW, CCSW-MCS [phone]

## 2023-02-16 NOTE — Progress Notes (Signed)
Assessment & Plan:  Stanley [name] was seen today for hypertension.  Diagnoses and all orders for this visit:  Hospital discharge follow-up Follow up with Cardiology  next week Continue all medications as prescribed.   Elevated serum creatinine -     Basic metabolic panel  Leukocytosis, unspecified type -     CBC with Differential    Patient has been counseled on age-appropriate routine health concerns for screening and prevention. These are reviewed and up-to-date. Referrals have been placed accordingly. Immunizations are up-to-date or declined.    Subjective:   Chief Complaint  Patient presents with   Hypertension    Stanley [name] 58 y.o. male presents to office today for HFU  He has a past medical history of Acute renal failure, Alcohol abuse, Anxiety, CAD, Chronic systolic CHF, Depression, Financial difficulties, Gout, History of noncompliance with medical treatment, HTN,  ICD, Insomnia, Lobar pneumonia (12/28/2018), MI (2005), Sleep apnea, an    d Ventricular tachycardia (01/2015).    He is followed by Cardiology for NICM  Jardiance on hold due to renal function. He was recently approved for digoxin, eplerenone, and losartan. Blood pressure is well controlled today. He does endorse shortness of breath with minimal exertion.   BP Readings from Last 3 Encounters:  02/16/23 117/65  02/12/23 (!) 100/58  02/11/23 124/83   HFU Stanley [name] was seen in the ED on 02-09-2023 with c/o DOE with worsening cough. CXR showing possible PNA. He was admitted for 2 days and treated for CHF exacerbation and CAP with furosemide, rocephin, augmentin and azithromycin. Resp panel negative.     Review of Systems  Constitutional:  Negative for fever, malaise/fatigue and weight loss.  HENT: Negative.  Negative for nosebleeds.   Eyes: Negative.  Negative for blurred vision, double vision and photophobia.  Respiratory:  Positive for shortness of breath. Negative for cough.   Cardiovascular:  Negative.  Negative for chest pain, palpitations and leg swelling.  Gastrointestinal: Negative.  Negative for heartburn, nausea and vomiting.  Musculoskeletal: Negative.  Negative for myalgias.  Neurological: Negative.  Negative for dizziness, focal weakness, seizures and headaches.  Psychiatric/Behavioral: Negative.  Negative for suicidal ideas.     Past Medical History:  Diagnosis Date   Acute renal failure (ARF) (HCC)    Acute respiratory failure with hypoxia (HCC) 12/29/2018   Alcohol abuse    Anxiety    CAD (coronary artery disease)    a. dx unclear, reported PCI in 2011 but cath 2014 normal coronaries and cardiac CT 07/2016 normal coronaries, no mention of stents.   Chronic chest pain    Chronic systolic CHF (congestive heart failure) (HCC)    Depression    Dyspnea    07/05/2019: per patient some times with exertion, "has to catch breath"   Family history of breast cancer 05/30/2021   Family history of pancreatic cancer 05/30/2021   Financial difficulties    Gout    Gouty arthritis    Headache    History of noncompliance with medical treatment    financial challenges   Hypertension    ICD (implantable cardioverter-defibrillator) in place    [place] [employer] ICD implant done in [place] 2015   Insomnia    Lobar pneumonia (HCC) 12/28/2018   Myocardial infarction (HCC) 2005   Nonischemic cardiomyopathy (HCC)    Sleep apnea    per patient, has CPAP does not wear it every night   Ventricular tachycardia (HCC) 01/2015   2017 - ATP delivered for sinus tach, degenerated into VT  for which he received shock. Recurrent VT 03/2018.    Past Surgical History:  Procedure Laterality Date   BAROREFLEX SYSTEM INSERTION     BIOPSY  03/06/2021   Procedure: BIOPSY;  Surgeon: Benancio Deeds, MD;  Location: [facility];  Service: Gastroenterology;;   CARDIAC DEFIBRILLATOR PLACEMENT  06/05/2013   [employer] Inogen ICD implanted at [facility] in [place] for primary prevention    COLONOSCOPY WITH PROPOFOL N/A 03/06/2021   Procedure: COLONOSCOPY WITH PROPOFOL;  Surgeon: Benancio Deeds, MD;  Location: [facility];  Service: Gastroenterology;  Laterality: N/A;   ESOPHAGEAL DILATION  03/06/2021   Procedure: ESOPHAGEAL DILATION;  Surgeon: Benancio Deeds, MD;  Location: [facility];  Service: Gastroenterology;;   ESOPHAGOGASTRODUODENOSCOPY (EGD) WITH PROPOFOL N/A 03/06/2021   Procedure: ESOPHAGOGASTRODUODENOSCOPY (EGD) WITH PROPOFOL;  Surgeon: Benancio Deeds, MD;  Location: [facility];  Service: Gastroenterology;  Laterality: N/A;   ESOPHAGOGASTRODUODENOSCOPY (EGD) WITH PROPOFOL N/A 05/18/2021   Procedure: ESOPHAGOGASTRODUODENOSCOPY (EGD) WITH PROPOFOL;  Surgeon: Sherrilyn Rist, MD;  Location: [facility] ENDOSCOPY;  Service: Gastroenterology;  Laterality: N/A;   HERNIA REPAIR     PERCUTANEOUS CORONARY STENT INTERVENTION (PCI-S)  01/05/2009   POLYPECTOMY  03/06/2021   Procedure: POLYPECTOMY;  Surgeon: Benancio Deeds, MD;  Location: [facility];  Service: Gastroenterology;;   RIGHT HEART CATH N/A 06/11/2022   Procedure: RIGHT HEART CATH;  Surgeon: Laurey Morale, MD;  Location: [facility] INVASIVE CV LAB;  Service: Cardiovascular;  Laterality: N/A;    Family History  Problem Relation Age of Onset   Breast cancer Mother    Pancreatic cancer Mother    Hypertension Father    Alzheimer's disease Father    Gout Father    Dementia Father    Hypertension Sister    Clotting disorder Sister    Obesity Brother    Bronchitis Brother    Cancer Maternal Uncle        unknown type; dx after 75; x2 maternal uncles   Breast cancer Paternal Aunt        dx after 18   Lung cancer Paternal Uncle    Heart disease Maternal Grandmother    Gout Paternal Grandfather    Colon polyps Neg Hx    Colon cancer Neg Hx    Esophageal cancer Neg Hx    Stomach cancer Neg Hx     Social History Reviewed with no changes to be made today.   Outpatient Medications Prior to Visit   Medication Sig Dispense Refill   allopurinol (ZYLOPRIM) 300 MG tablet TAKE 1 TABLET EVERY DAY (NEED MD APPOINTMENT) 30 tablet 11   amiodarone (PACERONE) 200 MG tablet Take 1 tablet (200 mg total) by mouth daily. 90 tablet 1   aspirin EC 81 MG tablet Take 81 mg by mouth daily. Swallow whole.     atorvastatin (LIPITOR) 20 MG tablet Take 1 tablet (20 mg total) by mouth daily. 90 tablet 3   azithromycin (ZITHROMAX) 500 MG tablet Take 1 tablet (500 mg total) by mouth daily. 1 tablet 0   furosemide (LASIX) 40 MG tablet Take 1 tablet (40 mg total) by mouth as needed. For weight gain of 3 lbs in 24 hours or 5 lbs in a week 90 tablet 3   nitroGLYCERIN (NITROSTAT) 0.4 MG SL tablet Place 1 tablet (0.4 mg total) under the tongue every 5 (five) minutes for 3 doses as needed for chest pain. 75 each 6   traZODone (DESYREL) 150 MG tablet TAKE 1 TABLET  AT BEDTIME 90 tablet 3   amoxicillin-clavulanate (AUGMENTIN) 875-125 MG tablet Take 1 tablet by mouth every 12 (twelve) hours. (Patient not taking: Reported on 02/16/2023) 7 tablet 0   digoxin (LANOXIN) 0.125 MG tablet Take by mouth. (Patient not taking: Reported on 02/16/2023)     empagliflozin (JARDIANCE) 10 MG TABS tablet Take 1 tablet (10 mg total) by mouth daily. (Patient not taking: Reported on 02/16/2023) 90 tablet 3   eplerenone (INSPRA) 25 MG tablet Take 1 tablet (25 mg total) by mouth daily. (Patient not taking: Reported on 02/16/2023) 90 tablet 1   losartan (COZAAR) 50 MG tablet Take 1 tablet (50 mg total) by mouth daily. (Patient not taking: Reported on 02/16/2023) 30 tablet 6   No facility-administered medications prior to visit.    Allergies  Allergen Reactions   Lisinopril Shortness Of Breath    Makes lips swell   Allopurinol Nausea Only    dizziness   Entresto [Sacubitril-Valsartan] Other (See Comments)    History of angioedema   Other Swelling    Nuts - Lip Swelling   Shellfish Allergy Swelling    Lip Swelling   Spironolactone Other (See  Comments)    Breast tenderness       Objective:    BP 117/65 (BP Location: Left Arm, Patient Position: Sitting, Cuff Size: Large)   Pulse 81   Resp 19   Ht 5\' 5"  (1.651 m)   Wt 175 lb 6.4 oz (79.6 kg)   SpO2 99%   BMI 29.19 kg/m  Wt Readings from Last 3 Encounters:  02/16/23 175 lb 6.4 oz (79.6 kg)  02/12/23 164 lb 6.4 oz (74.6 kg)  02/11/23 158 lb 1.1 oz (71.7 kg)    Physical Exam Vitals and nursing note reviewed.  Constitutional:      Appearance: He is well-developed.  HENT:     Head: Normocephalic and atraumatic.  Cardiovascular:     Rate and Rhythm: Normal rate and regular rhythm.     Heart sounds: Normal heart sounds. No murmur heard.    No friction rub. No gallop.  Pulmonary:     Effort: Pulmonary effort is normal. No tachypnea or respiratory distress.     Breath sounds: Normal breath sounds. No decreased breath sounds, wheezing, rhonchi or rales.  Chest:     Chest wall: No tenderness.  Abdominal:     General: Bowel sounds are normal.     Palpations: Abdomen is soft.  Musculoskeletal:        General: Normal range of motion.     Cervical back: Normal range of motion.  Skin:    General: Skin is warm and dry.  Neurological:     Mental Status: He is alert and oriented to person, place, and time.     Coordination: Coordination normal.  Psychiatric:        Behavior: Behavior normal. Behavior is cooperative.        Thought Content: Thought content normal.        Judgment: Judgment normal.          Patient has been counseled extensively about nutrition and exercise as well as the importance of adherence with medications and regular follow-up. The patient was given clear instructions to go to ER or return to medical center if symptoms don't improve, worsen or new problems develop. The patient verbalized understanding.   Follow-up: Return in about 3 months (around 05/16/2023).   [name], FNP-BC [place] [facility] [place], [place]  [phone]   02/16/2023, 9:05 PM

## 2023-02-17 ENCOUNTER — Other Ambulatory Visit: Payer: Self-pay | Admitting: Nurse Practitioner

## 2023-02-17 LAB — CBC WITH DIFFERENTIAL/PLATELET
Basophils Absolute: 0.1 10*3/uL (ref 0.0–0.2)
Basos: 1 %
EOS (ABSOLUTE): 0.1 10*3/uL (ref 0.0–0.4)
Eos: 1 %
Hematocrit: 39.5 % (ref 37.5–51.0)
Hemoglobin: 13 g/dL (ref 13.0–17.7)
Immature Grans (Abs): 0.1 10*3/uL (ref 0.0–0.1)
Immature Granulocytes: 1 %
Lymphocytes Absolute: 1.5 10*3/uL (ref 0.7–3.1)
Lymphs: 19 %
MCH: 33 pg (ref 26.6–33.0)
MCHC: 32.9 g/dL (ref 31.5–35.7)
MCV: 100 fL — ABNORMAL HIGH (ref 79–97)
Monocytes Absolute: 0.6 10*3/uL (ref 0.1–0.9)
Monocytes: 8 %
Neutrophils Absolute: 5.6 10*3/uL (ref 1.4–7.0)
Neutrophils: 70 %
Platelets: 268 10*3/uL (ref 150–450)
RBC: 3.94 x10E6/uL — ABNORMAL LOW (ref 4.14–5.80)
RDW: 11.3 % — ABNORMAL LOW (ref 11.6–15.4)
WBC: 7.9 10*3/uL (ref 3.4–10.8)

## 2023-02-17 LAB — BASIC METABOLIC PANEL
BUN/Creatinine Ratio: 14 (ref 9–20)
BUN: 17 mg/dL (ref 6–24)
CO2: 19 mmol/L — ABNORMAL LOW (ref 20–29)
Calcium: 9.2 mg/dL (ref 8.7–10.2)
Chloride: 108 mmol/L — ABNORMAL HIGH (ref 96–106)
Creatinine, Ser: 1.23 mg/dL (ref 0.76–1.27)
Glucose: 80 mg/dL (ref 70–99)
Potassium: 4.3 mmol/L (ref 3.5–5.2)
Sodium: 139 mmol/L (ref 134–144)
eGFR: 68 mL/min/{1.73_m2} (ref >59)

## 2023-02-18 ENCOUNTER — Telehealth (HOSPITAL_COMMUNITY): Payer: Self-pay | Admitting: Licensed Clinical Social Worker

## 2023-02-18 NOTE — Telephone Encounter (Signed)
CSW received return call form patient requesting assistance with additional insurance options. Patient reports he lives alone, has Social Security disability income approximately $33 and has Medicare A. Patient unclear why he does not have Medicare B and states he has contacted Medicare and still unclear why he is not eligible. He states he has no assets and based on his income appears as though he may be eligible for Medicaid. CSW encouraged patient to go to [employer] to apply for medicaid and follow up on the medicare question with [facility]. Patient verbalizes understanding and will reach back out to CSW if needed. Lasandra Beech, LCSW, CCSW-MCS [phone]

## 2023-02-21 ENCOUNTER — Encounter: Payer: Self-pay | Admitting: Nurse Practitioner

## 2023-02-23 ENCOUNTER — Ambulatory Visit (HOSPITAL_COMMUNITY)
Admission: RE | Admit: 2023-02-23 | Discharge: 2023-02-23 | Disposition: A | Discharge status: Home / Self Care | Payer: Self-pay | Source: Ambulatory Visit | Attending: Cardiology | Admitting: Cardiology

## 2023-02-23 DIAGNOSIS — I5022 Chronic systolic (congestive) heart failure: Secondary | ICD-10-CM | POA: Insufficient documentation

## 2023-02-23 LAB — BASIC METABOLIC PANEL
Anion gap: 12 (ref 5–15)
BUN: 14 mg/dL (ref 6–20)
CO2: 20 mmol/L — ABNORMAL LOW (ref 22–32)
Calcium: 9.1 mg/dL (ref 8.9–10.3)
Chloride: 102 mmol/L (ref 98–111)
Creatinine, Ser: 1.5 mg/dL — ABNORMAL HIGH (ref 0.61–1.24)
GFR, Estimated: 53 mL/min — ABNORMAL LOW (ref >60)
Glucose, Bld: 110 mg/dL — ABNORMAL HIGH (ref 70–99)
Potassium: 3.3 mmol/L — ABNORMAL LOW (ref 3.5–5.1)
Sodium: 134 mmol/L — ABNORMAL LOW (ref 135–145)

## 2023-02-24 ENCOUNTER — Telehealth (HOSPITAL_COMMUNITY): Payer: Self-pay

## 2023-02-24 MED ORDER — EPLERENONE 25 MG PO TABS: 12.5000 mg | ORAL_TABLET | Freq: Every day | ORAL | 1 refills | Status: DC

## 2023-02-24 NOTE — Telephone Encounter (Signed)
-----   Message from Jacklynn Ganong sent at 02/24/2023 12:36 PM EST ----- K low. Restart spiro 12.5 mg daily.   Will repeat labs at his follow up 2/28

## 2023-02-24 NOTE — Telephone Encounter (Signed)
Spoke with patient regarding the following results. Patient made aware and patient verbalized understanding.   Patient previously on eplerenone 25mg  due to breast tenderness with spironolactone.   Confirmed with Shanda Bumps, NP patient to restart eplerenone 12.5mg  (1/2) tablet instead. Patient is aware and verbalized understanding. Medication list updated and medication sent to pharmacy.    Patient will get repeat BMET at 2/28 appointment.   Advised patient to call back to office with any issues, questions, or concerns. Patient verbalized understanding.

## 2023-03-04 ENCOUNTER — Telehealth (HOSPITAL_COMMUNITY): Payer: Self-pay

## 2023-03-04 NOTE — Telephone Encounter (Signed)
 Called and left patient a voice message to confirm/remind patient of their appointment at [facility] on 03/05/23.   And to bring in all medications and/or complete list.

## 2023-03-04 NOTE — Progress Notes (Signed)
 [facility] NOTE  PCP: [name], NP EP: Dr. Lalla Brothers HF Cardiologist: Dr. Gala Romney  CC: HF follow up  HPI: Stanley [name] is a 58 y.o. male with systolic HF due to NICM d/x'd in 2006 (felt 2/2 HTN/ETOH), ? CAD s/p PCI to unknown vessel 2011 at outside location (cath 2014 without CAD and normal coronaries by CT 07/2016), VT s/p [facility] ICD implant ([place] 2015), noncompliance, chronic noncardiac chest pain, depression, HTN and ETOH abuse.    In 2017 he received inappropriate therapy for ST with ATP (after a work out at [employer] and reported noncompliance with meds) that degenerated into VT and received several shocks. He has had issues with chronic chest pain without evidence for ischemic etiology including normal cath and cardiac CT as above. This is relatively unchanged. Echo 03/2016  EF 35-40%, grade 1 DD, mild MR, moderate LAE  Had recurrent ICD shock in 3/20 in setting of medicine noncompliance. Amio continued. Echo 3/20 EF 25-30% Normal RV.   Echo 3/21: EF 25-30% RV ok S/p Barostim 7/21 Myoview 5/22: EF 34% fixed inferior defect  Admitted 6/24 with recurrent VT. 2/2 intoxication and metabolic derangement (K 3.1, Mag 1.2). Started on amiodarone gtt. Echo showed EF < 20%, RV mildly reduced. Required pressors and ICU monitoring due to worsening respiratory failure and ETOH withdrawal. Underwent RHC showing elevated RA pressures, normal PCWP, mild to moderate PAH and decreased output with CI (Thermo) 1.7. DBA gtt started. Drips weaned and amio transitioned to oral. GDMT titrated, but limited by AKI. He was discharged home, weight 136 lbs.  Echo 10/08/22 EF 30-35% RV ok. Mild to moderate MR   Seen in ED 02/10/23 with a/c HF & CAP. Echo showed EF 25-30%, G1DD, normal RV, mild to moderate MR. Diuresed with IV lasix and treated with abx. Dig, losartan, lasix and Inspra held with elevated SCr of 1.7  Today he returns for HF follow up. Overall feeling fine. Still has SOB walking  on flat ground, has a few more days of abx left. Has chest tightness. Denies palpitations, abnormal bleeding, dizziness, edema, or PND/Orthopnea. Appetite ok. No fever or chills. Weight at home 161 pounds. Taking all medications. Drinks wine on weekends.   Cardiac Studies - Echo 2/25 EF 25-30%, G1DD, normal RV, mild to moderate MR  - Echo (10/24): EF 30-35%, RV ok, mild to moderate MR  - RHC (6/24): RA mean 10, PA 50/23 (mean 34), PCWP mean 11, CO/CI (Fick) 3.87/2.25, CO/CI (thermo) 2.92/1.78,  PVR 5.9  WU, PAPi 2.7  - Echo (6/24): EF < 20%, grade I DD, RV mildly reduced, moderate MR, moderate to severe TR  - Myoview (5/22): EF 34% fixed inferior defect  - S/p Barostim (7/21)  - Echo (3/21): EF 25-30% RV ok  - CPX (09/19/18) FVC 1.96 (57%)      FEV1 1.60 (58%)        FEV1/FVC 81 (101%)        MVV 80 (61%)  Resting HR: 77 Peak HR: 131   (79% age predicted max HR)  BP rest: 168/80          Standing BP: 156/74 BP peak: 196/80   Peak VO2: 14.9 (52% predicted peak VO2)  VE/VCO2 slope:  31  OUES: 1.43  Peak RER: 1.05  VE/MVV:  50% O2pulse:  10   (67% predicted O2pulse)  Moderate to severe HF limitation but normal VeVCO2 slope is reassuring. Restrictive lung physiology.   - Echo (3/20) EF  25-30% Normal RV.   - Echo (3/18):  EF 35-40%, grade 1 DD, mild MR, moderate LAE  Past Medical History:  Diagnosis Date   Acute renal failure (ARF) (HCC)    Acute respiratory failure with hypoxia (HCC) 12/29/2018   Alcohol abuse    Anxiety    CAD (coronary artery disease)    a. dx unclear, reported PCI in 2011 but cath 2014 normal coronaries and cardiac CT 07/2016 normal coronaries, no mention of stents.   Chronic chest pain    Chronic systolic CHF (congestive heart failure) (HCC)    Depression    Dyspnea    07/05/2019: per patient some times with exertion, "has to catch breath"   Family history of breast cancer 05/30/2021   Family history of pancreatic cancer 05/30/2021   Financial  difficulties    Gout    Gouty arthritis    Headache    History of noncompliance with medical treatment    financial challenges   Hypertension    ICD (implantable cardioverter-defibrillator) in place    [place] [employer] ICD implant done in [place] 2015   Insomnia    Lobar pneumonia (HCC) 12/28/2018   Myocardial infarction (HCC) 2005   Nonischemic cardiomyopathy (HCC)    Sleep apnea    per patient, has CPAP does not wear it every night   Ventricular tachycardia (HCC) 01/2015   2017 - ATP delivered for sinus tach, degenerated into VT for which he received shock. Recurrent VT 03/2018.   Current Outpatient Medications  Medication Sig Dispense Refill   allopurinol (ZYLOPRIM) 300 MG tablet TAKE 1 TABLET EVERY DAY (NEED MD APPOINTMENT) 30 tablet 11   amiodarone (PACERONE) 200 MG tablet Take 1 tablet (200 mg total) by mouth daily. 90 tablet 1   amoxicillin-clavulanate (AUGMENTIN) 875-125 MG tablet Take 1 tablet by mouth every 12 (twelve) hours. (Patient not taking: Reported on 02/16/2023) 7 tablet 0   aspirin EC 81 MG tablet Take 81 mg by mouth daily. Swallow whole.     atorvastatin (LIPITOR) 20 MG tablet Take 1 tablet (20 mg total) by mouth daily. 90 tablet 3   azithromycin (ZITHROMAX) 500 MG tablet Take 1 tablet (500 mg total) by mouth daily. 1 tablet 0   [Paused] digoxin (LANOXIN) 0.125 MG tablet Take by mouth. (Patient not taking: Reported on 02/16/2023)     empagliflozin (JARDIANCE) 10 MG TABS tablet Take 1 tablet (10 mg total) by mouth daily. (Patient not taking: Reported on 02/16/2023) 90 tablet 3   eplerenone (INSPRA) 25 MG tablet Take 0.5 tablets (12.5 mg total) by mouth daily. 45 tablet 1   furosemide (LASIX) 40 MG tablet Take 1 tablet (40 mg total) by mouth as needed. For weight gain of 3 lbs in 24 hours or 5 lbs in a week 90 tablet 3   [Paused] losartan (COZAAR) 50 MG tablet Take 1 tablet (50 mg total) by mouth daily. (Patient not taking: Reported on 02/16/2023) 30 tablet 6   nitroGLYCERIN  (NITROSTAT) 0.4 MG SL tablet Place 1 tablet (0.4 mg total) under the tongue every 5 (five) minutes for 3 doses as needed for chest pain. 75 each 6   traZODone (DESYREL) 150 MG tablet TAKE 1 TABLET AT BEDTIME 90 tablet 3   No current facility-administered medications for this visit.   Allergies  Allergen Reactions   Lisinopril Shortness Of Breath    Makes lips swell   Allopurinol Nausea Only    dizziness   Entresto [Sacubitril-Valsartan] Other (See Comments)  History of angioedema   Other Swelling    Nuts - Lip Swelling   Shellfish Allergy Swelling    Lip Swelling   Spironolactone Other (See Comments)    Breast tenderness   Social History   Socioeconomic History   Marital status: Divorced    Spouse name: Not on file   Number of children: 1   Years of education: 12   Highest education level: Not on file  Occupational History   Occupation: Disability  Tobacco Use   Smoking status: Never   Smokeless tobacco: Never  Vaping Use   Vaping status: Never Used  Substance and Sexual Activity   Alcohol use: Not Currently    Comment: Last drink before 06/08/2022   Drug use: No   Sexual activity: Not Currently  Other Topics Concern   Not on file  Social History Narrative   Lives in [place] alone.  Disabled.  Previously worked as a Geophysicist/field seismologist.   Fun: Rest, Read and watch movies.    Social Drivers of Health   Financial Resource Strain: High Risk (06/30/2022)   Overall Financial Resource Strain (CARDIA)    Difficulty of Paying Living Expenses: Hard  Food Insecurity: Food Insecurity Present (02/11/2023)   Hunger Vital Sign    Worried About Running Out of Food in the Last Year: Sometimes true    Ran Out of Food in the Last Year: Sometimes true  Transportation Needs: Unmet Transportation Needs (02/11/2023)   PRAPARE - Administrator, Civil Service (Medical): Yes    Lack of Transportation (Non-Medical): No  Physical Activity: Unknown (03/21/2018)   Exercise  Vital Sign    Days of Exercise per Week: Patient declined    Minutes of Exercise per Session: Patient declined  Stress: Stress Concern Present (03/21/2018)   [employer] of Occupational Health - Occupational Stress Questionnaire    Feeling of Stress : To some extent  Social Connections: Unknown (03/21/2018)   Social Connection and Isolation Panel [NHANES]    Frequency of Communication with Friends and Family: Patient declined    Frequency of Social Gatherings with Friends and Family: Patient declined    Attends Religious Services: Patient declined    Database administrator or Organizations: Patient declined    Attends Banker Meetings: Patient declined    Marital Status: Patient declined  Intimate Partner Violence: Not At Risk (02/11/2023)   Humiliation, Afraid, Rape, and Kick questionnaire    Fear of Current or Ex-Partner: No    Emotionally Abused: No    Physically Abused: No    Sexually Abused: No   Family History  Problem Relation Age of Onset   Breast cancer Mother    Pancreatic cancer Mother    Hypertension Father    Alzheimer's disease Father    Gout Father    Dementia Father    Hypertension Sister    Clotting disorder Sister    Obesity Brother    Bronchitis Brother    Cancer Maternal Uncle        unknown type; dx after 64; x2 maternal uncles   Breast cancer Paternal Aunt        dx after 50   Lung cancer Paternal Uncle    Heart disease Maternal Grandmother    Gout Paternal Grandfather    Colon polyps Neg Hx    Colon cancer Neg Hx    Esophageal cancer Neg Hx    Stomach cancer Neg Hx    There were no vitals taken  for this visit.  Wt Readings from Last 3 Encounters:  02/16/23 79.6 kg (175 lb 6.4 oz)  02/12/23 74.6 kg (164 lb 6.4 oz)  02/11/23 71.7 kg (158 lb 1.1 oz)   PHYSICAL EXAM: General:  NAD. No resp difficulty HEENT: Normal Neck: Supple. JVP 8-10 Cor: Regular rate & rhythm. No rubs, gallops, 2/6 SEM Lungs: Clear Abdomen: Soft,  nontender, nondistended.  Extremities: No cyanosis, clubbing, rash, edema Neuro: Alert & oriented x 3, moves all 4 extremities w/o difficulty. Affect pleasant.  ReDs reading: 26 %, normal  ECG (personally reviewed): NSR 79 bpm, barostim artifact  Device interrogation (personally reviewed): No VT. Activity 0.8 hr/day, average HR 84 bpm   ASSESSMENT & PLAN:  1. VT  - Shocks from [employer] ICD (06/08/22), 2/2 hypomag and hypokalemia, ETOH abuse and medication noncompliance..  - Continue amiodarone per EP - No recurrent VT  - Device interrogation as above - Amio labs (9/24) ok  2. Chronic systolic CHF - Mostly NICM (? H/o PCI to unknown vessel in 2011.) CTA 2018 normal cors - s/p [employer] ICD - Echo (3/20): EF 25-30% - CPX 9/20 moderate to severe HF limitation. pVO2: 14.9 (52% predicted peak VO2).  slope: 31  pRER: 1.05  - Echo (03/10/19): EF 25-30% RV ok Mild MR - Myoview (5/22): EF 34% fixed inferior defect - Echo (6/24): EF < 20%, RV mildly reduced (in setting of VT), moderate-severe TR, moderate MR.  - RHC (6/24): RA 10, PCWP 11, CO/CI (Thermo) 2.9/1.78. >>started on DBA - CMP likely 2/2 ETOH -> stopped drinking in 6/24  - Echo (10/24): EF 30-35%  - Echo 2/25 EF 25-30%, G1DD, normal RV, mild to moderate MR - NYHA IIb, Volume OK today, Reds normal at 26%. GDMT limited by recent AKI and BP. - Restart Jardiance 10 mg daily - Will give Rx for Lasix 40 mg PRN - Add back losartan (ACEi allergy) and Inspra next. - Labs today.  3. ETOH abuse - Heavy ETOH use, likely contributes to cardiomyopathy.  - Has cut way back since d/c.  - Congratulated  4. CKD 3  - Baseline SCr 1.3-1.6 - Recent SCr up to 1.7 - Restart SGLT2i as above - Labs today  5. HTN - BP stable today - Plan to add back GDMT as able  6. CAD - h/o single vessel PCI - Myoview (5/22): EF 34% fixed inferior defect - Recent chest tightness, suspect due to volume - Continue ASA + statin  7. OSA - Unable to  tolerate CPAP - No change  Follow up in 2-3 weeks with APP (add back GDMT)  Jacklynn Ganong, FNP  3:37 PM

## 2023-03-05 ENCOUNTER — Ambulatory Visit (HOSPITAL_COMMUNITY)
Admission: RE | Admit: 2023-03-05 | Discharge: 2023-03-05 | Disposition: A | Discharge status: Home / Self Care | Payer: Self-pay | Source: Ambulatory Visit | Attending: Family Medicine | Admitting: Family Medicine

## 2023-03-05 ENCOUNTER — Other Ambulatory Visit: Payer: Self-pay

## 2023-03-05 ENCOUNTER — Encounter (HOSPITAL_COMMUNITY): Payer: Self-pay

## 2023-03-05 ENCOUNTER — Other Ambulatory Visit (HOSPITAL_COMMUNITY): Payer: Self-pay

## 2023-03-05 ENCOUNTER — Other Ambulatory Visit (HOSPITAL_BASED_OUTPATIENT_CLINIC_OR_DEPARTMENT_OTHER): Payer: Self-pay

## 2023-03-05 VITALS — BP 152/82 | HR 86 | Wt 168.8 lb

## 2023-03-05 DIAGNOSIS — I10 Essential (primary) hypertension: Secondary | ICD-10-CM

## 2023-03-05 DIAGNOSIS — F32A Depression, unspecified: Secondary | ICD-10-CM | POA: Insufficient documentation

## 2023-03-05 DIAGNOSIS — F1010 Alcohol abuse, uncomplicated: Secondary | ICD-10-CM | POA: Insufficient documentation

## 2023-03-05 DIAGNOSIS — T500X6A Underdosing of mineralocorticoids and their antagonists, initial encounter: Secondary | ICD-10-CM | POA: Insufficient documentation

## 2023-03-05 DIAGNOSIS — I428 Other cardiomyopathies: Secondary | ICD-10-CM | POA: Insufficient documentation

## 2023-03-05 DIAGNOSIS — Z79899 Other long term (current) drug therapy: Secondary | ICD-10-CM | POA: Insufficient documentation

## 2023-03-05 DIAGNOSIS — I5022 Chronic systolic (congestive) heart failure: Secondary | ICD-10-CM | POA: Insufficient documentation

## 2023-03-05 DIAGNOSIS — I2510 Atherosclerotic heart disease of native coronary artery without angina pectoris: Secondary | ICD-10-CM | POA: Insufficient documentation

## 2023-03-05 DIAGNOSIS — N183 Chronic kidney disease, stage 3 unspecified: Secondary | ICD-10-CM | POA: Insufficient documentation

## 2023-03-05 DIAGNOSIS — Z9861 Coronary angioplasty status: Secondary | ICD-10-CM | POA: Insufficient documentation

## 2023-03-05 DIAGNOSIS — I472 Ventricular tachycardia, unspecified: Secondary | ICD-10-CM | POA: Insufficient documentation

## 2023-03-05 DIAGNOSIS — Z95810 Presence of automatic (implantable) cardiac defibrillator: Secondary | ICD-10-CM | POA: Insufficient documentation

## 2023-03-05 DIAGNOSIS — I130 Hypertensive heart and chronic kidney disease with heart failure and stage 1 through stage 4 chronic kidney disease, or unspecified chronic kidney disease: Secondary | ICD-10-CM | POA: Insufficient documentation

## 2023-03-05 DIAGNOSIS — G4733 Obstructive sleep apnea (adult) (pediatric): Secondary | ICD-10-CM | POA: Insufficient documentation

## 2023-03-05 DIAGNOSIS — Z91128 Patient's intentional underdosing of medication regimen for other reason: Secondary | ICD-10-CM | POA: Insufficient documentation

## 2023-03-05 DIAGNOSIS — N1831 Chronic kidney disease, stage 3a: Secondary | ICD-10-CM

## 2023-03-05 DIAGNOSIS — T466X6A Underdosing of antihyperlipidemic and antiarteriosclerotic drugs, initial encounter: Secondary | ICD-10-CM | POA: Insufficient documentation

## 2023-03-05 LAB — BASIC METABOLIC PANEL
Anion gap: 14 (ref 5–15)
BUN: 20 mg/dL (ref 6–20)
CO2: 20 mmol/L — ABNORMAL LOW (ref 22–32)
Calcium: 9.1 mg/dL (ref 8.9–10.3)
Chloride: 102 mmol/L (ref 98–111)
Creatinine, Ser: 1.35 mg/dL — ABNORMAL HIGH (ref 0.61–1.24)
GFR, Estimated: >60 mL/min (ref >60)
Glucose, Bld: 95 mg/dL (ref 70–99)
Potassium: 3.3 mmol/L — ABNORMAL LOW (ref 3.5–5.1)
Sodium: 136 mmol/L (ref 135–145)

## 2023-03-05 LAB — BRAIN NATRIURETIC PEPTIDE: B Natriuretic Peptide: 154.5 pg/mL — ABNORMAL HIGH (ref 0.0–100.0)

## 2023-03-05 MED ORDER — LOSARTAN POTASSIUM 25 MG PO TABS
25.0000 mg | ORAL_TABLET | Freq: Every day | ORAL | 1 refills | Status: DC
  Filled 2023-03-05: qty 90, 90d supply, fill #0

## 2023-03-05 MED ORDER — ATORVASTATIN CALCIUM 20 MG PO TABS
20.0000 mg | ORAL_TABLET | Freq: Every day | ORAL | 3 refills | Status: DC
  Filled 2023-03-05 – 2023-03-09 (×3): qty 90, 90d supply, fill #0

## 2023-03-05 MED ORDER — FUROSEMIDE 40 MG PO TABS
40.0000 mg | ORAL_TABLET | ORAL | 3 refills | Status: DC | PRN
  Filled 2023-03-05: qty 90, 90d supply, fill #0

## 2023-03-05 MED ORDER — EMPAGLIFLOZIN 10 MG PO TABS
10.0000 mg | ORAL_TABLET | Freq: Every day | ORAL | 3 refills | Status: AC
  Filled 2023-03-05 – 2023-03-09 (×5): qty 90, 90d supply, fill #0
  Filled 2023-10-13: qty 30, 30d supply, fill #1

## 2023-03-05 MED ORDER — EPLERENONE 25 MG PO TABS
12.5000 mg | ORAL_TABLET | Freq: Every day | ORAL | 1 refills | Status: DC
Start: 2023-03-05 — End: 2023-03-31
  Filled 2023-03-05 – 2023-03-09 (×5): qty 45, 90d supply, fill #0

## 2023-03-05 NOTE — Progress Notes (Signed)
 ReDS Vest / Clip - 03/05/23 1200       ReDS Vest / Clip   Station Marker A    Ruler Value 28    ReDS Value Range Moderate volume overload    ReDS Actual Value 37

## 2023-03-05 NOTE — Patient Instructions (Addendum)
 Medication Changes:  RESTART JARDIANCE 10MG  DAILY   RESTART LOSARTAN 25MG  DAILY   RESTART EPLERENONE 12.5MG  DAILY   RESTART: ATORVASTATIN 20MG  DAILY   TAKE LASIX (FUROSEMIDE) 40MG  DAILY ONLY AS NEEDED   MEDICATIONS SENT TO [facility]   Lab Work:  Labs done today, your results will be available in MyChart, we will contact you for abnormal readings.  THEN LABS AGAIN IN 7 DAYS AS SCHEDULED   THEN RETURN FOR LABS IN 8 WEEKS AS SCHEDULED   Follow-Up in: 3-4 WEEKS AS SCHEDULED WITH PHARMACY   THEN AGAIN IN 4 MONTHS PLEASE CALL OUR OFFICE AROUND MAY TO GET SCHEDULED FOR YOUR APPOINTMENT. PHONE NUMBER IS [phone] OPTION 2   At the [facility], you and your health needs are our priority. We have a designated team specialized in the treatment of Heart Failure. This Care Team includes your primary Heart Failure Specialized Cardiologist (physician), Advanced Practice Providers (APPs- Physician Assistants and Nurse Practitioners), and Pharmacist who all work together to provide you with the care you need, when you need it.   You may see any of the following providers on your designated Care Team at your next follow up:  Dr. Arvilla Meres Dr. Marca Ancona Dr. Dorthula Nettles Dr. Theresia Bough Tonye Becket, NP Robbie Lis, [place] [facility] [place], [place] Brynda Peon, NP [place] Lee, NP Karle Plumber, PharmD   Please be sure to bring in all your medications bottles to every appointment.   Need to Contact us:  If you have any questions or concerns before your next appointment please send [place] a message through [place] or call our office at [phone].    TO LEAVE A MESSAGE FOR THE NURSE SELECT OPTION 2, PLEASE LEAVE A MESSAGE INCLUDING: YOUR NAME DATE OF BIRTH CALL BACK NUMBER REASON FOR CALL**this is important as we prioritize the call backs  YOU WILL RECEIVE A CALL BACK THE SAME DAY AS LONG AS YOU CALL BEFORE 4:00 PM

## 2023-03-09 ENCOUNTER — Other Ambulatory Visit (HOSPITAL_COMMUNITY): Payer: Self-pay

## 2023-03-09 ENCOUNTER — Other Ambulatory Visit: Payer: Self-pay

## 2023-03-11 ENCOUNTER — Other Ambulatory Visit: Payer: Self-pay

## 2023-03-12 ENCOUNTER — Ambulatory Visit (HOSPITAL_COMMUNITY)
Admission: RE | Admit: 2023-03-12 | Discharge: 2023-03-12 | Disposition: A | Discharge status: Home / Self Care | Payer: Self-pay | Source: Ambulatory Visit | Attending: Internal Medicine | Admitting: Internal Medicine

## 2023-03-12 DIAGNOSIS — I5022 Chronic systolic (congestive) heart failure: Secondary | ICD-10-CM | POA: Insufficient documentation

## 2023-03-12 LAB — BASIC METABOLIC PANEL
Anion gap: 7 (ref 5–15)
BUN: 18 mg/dL (ref 6–20)
CO2: 20 mmol/L — ABNORMAL LOW (ref 22–32)
Calcium: 9.1 mg/dL (ref 8.9–10.3)
Chloride: 109 mmol/L (ref 98–111)
Creatinine, Ser: 1.23 mg/dL (ref 0.61–1.24)
GFR, Estimated: >60 mL/min (ref >60)
Glucose, Bld: 112 mg/dL — ABNORMAL HIGH (ref 70–99)
Potassium: 3.5 mmol/L (ref 3.5–5.1)
Sodium: 136 mmol/L (ref 135–145)

## 2023-03-12 LAB — BRAIN NATRIURETIC PEPTIDE: B Natriuretic Peptide: 151.5 pg/mL — ABNORMAL HIGH (ref 0.0–100.0)

## 2023-03-16 ENCOUNTER — Encounter: Payer: Self-pay | Admitting: Cardiology

## 2023-03-16 ENCOUNTER — Other Ambulatory Visit (HOSPITAL_COMMUNITY): Payer: Self-pay

## 2023-03-18 ENCOUNTER — Encounter: Payer: Self-pay | Admitting: Cardiology

## 2023-03-24 ENCOUNTER — Ambulatory Visit (INDEPENDENT_AMBULATORY_CARE_PROVIDER_SITE_OTHER): Payer: Medicaid Other

## 2023-03-24 DIAGNOSIS — I472 Ventricular tachycardia, unspecified: Secondary | ICD-10-CM

## 2023-03-25 LAB — CUP PACEART REMOTE DEVICE CHECK
Battery Remaining Longevity: 66 mo
Battery Remaining Percentage: 62 %
Brady Statistic RV Percent Paced: 0 %
Date Time Interrogation Session: 20250319023100
HighPow Impedance: 47 Ohm
Implantable Lead Connection Status: 753985
Implantable Lead Implant Date: 20150601
Implantable Lead Location: 753860
Implantable Lead Model: 295
Implantable Lead Serial Number: 134196
Implantable Pulse Generator Implant Date: 20150601
Lead Channel Impedance Value: 393 Ohm
Lead Channel Pacing Threshold Amplitude: 1.4 V
Lead Channel Pacing Threshold Pulse Width: 0.6 ms
Lead Channel Setting Pacing Amplitude: 3 V
Lead Channel Setting Pacing Pulse Width: 0.6 ms
Lead Channel Setting Sensing Sensitivity: 0.6 mV
Pulse Gen Serial Number: 190689
Zone Setting Status: 755011

## 2023-03-28 ENCOUNTER — Encounter: Payer: Self-pay | Admitting: Cardiology

## 2023-03-29 NOTE — Progress Notes (Incomplete)
 ***In Progress***    Advanced Heart Failure Clinic Note  PCP: [name], NP EP: Dr. Lalla Brothers HF Cardiologist: Dr. Gala Romney  HPI:  Stanley [name] is a 58 y.o. male with systolic HF due to NICM d/x'd in 2006 (felt 2/2 HTN/ETOH), ? CAD s/p PCI to unknown vessel 2011 at outside location (cath 2014 without CAD and normal coronaries by CT 07/2016), VT s/p [facility] ICD implant ([place] 2015), noncompliance, chronic noncardiac chest pain, depression, HTN and ETOH abuse.     In 2017 he received inappropriate therapy for ST with ATP (after a work out at [employer] and reported noncompliance with meds) that degenerated into VT and received several shocks. He has had issues with chronic chest pain without evidence for ischemic etiology including normal cath and cardiac CT as above. Echo 03/2016  EF 35-40%, grade 1 DD, mild MR, moderate LAE. Had recurrent ICD shock in 3/20 in setting of medicine noncompliance. Amio continued. Echo 3/20 EF 25-30% Normal RV.     Echo 03/2019 demonstrated EF 25-30% , RV ok. S/p Barostim 07/2019. Myoview 05/2020: EF 34% fixed inferior defect   Admitted 06/2022 with recurrent VT. 2/2 intoxication and metabolic derangement (K 3.1, Mag 1.2). Started on amiodarone gtt. Echo showed EF < 20%, RV mildly reduced. Required pressors and ICU monitoring due to worsening respiratory failure and ETOH withdrawal. Underwent RHC showing elevated RA pressures, normal PCWP, mild to moderate PH and decreased output with CI (Thermo) 1.7. DBA gtt started. Drips weaned and amiodarone transitioned to oral. GDMT titrated, but limited by AKI. He was discharged home, weight 136 lbs.   Returned to [facility] for follow up 08/05/22. Overall was feeling great.  Doing well taking his meds. No SOB with walking further distances on flat ground, walks around his apartment. Denied palpitations, abnormal bleeding, syncope, CP, dizziness, edema, or PND/Orthopnea. Appetite was ok. No fever or chills. Weight at home was 138  pounds. Had not needed Lasix. Sober since 06/08/22 from ETOH! Struggling with transportation issues due to driving restrictions. Seen by pharmacy in Aug 2024. Switched from spironolactone to eplerenone due to breast tenderness, increased losartan to 25 mg daily, and continued other medications. At appt with Dr. Gala Romney in Oct 2024, instructed to stop digoxin. EF 10/2022 stable at 30-35%. Seen in APP clinic Jan 2025, had been out of losartan and eplerenone x1 mo. Also reported feeling more ShOB after stopping digoxin. ReDs elevated to 47%, weight up 10 lbs. Restarted medications. Went to ED on 02/09/23 for fluid overload, BNP 1195, WBC 13.6, K 3.2, Cr 1.47 (up from 1.09) - admitted x2 days and received IV Lasix and abx for suspected CAP (elevated WBC, PCT 0.27) with symptom improvement. EF 25-30%. Due to AKI - patient was instructed to hold digoxin, eplerenone, furosmide (except for PRN), and losartan. These medications have been slowly restarted with improving kidney function. At most recent HF follow-up in APP clinic on 03/05/23, patient reported being out of atorvatatin, Jardiance, and eplerenone. He receives medications via [facility] mail order. He was taking Lasix 40 mg every other day. ReDs up to 37%. He was instructed to restart Jardiance, eplerenone, and losartan. Repeat BMET/BNP on 03/12/23 stable. Patient was enrolled in [place] active through 01/12/24 to help with medication costs.     Today he returns to HF clinic for pharmacist medication titration. At last visit with MD ***.   Overall feeling ***. Dizziness, lightheadedness, fatigue:  Chest pain or palpitations:  How is your breathing?: *** SOB: Able to  complete all ADLs. Activity level ***  Weight at home pounds. Takes furosemide/torsemide/bumex *** mg *** daily.  LEE PND/Orthopnea  Appetite *** Low-salt diet:   Physical Exam Cost/affordability of meds   BP has been elevated (152/82, pulse 86), but he has also been out of  meds Consider hydral/isosorbide due to issues with kidney function, vs pushing ARB/eplerenone  HF Medications: Losartan 25 mg daily Eplerenone 25 mg daily Jardiance 10 mg daily Lasix 40 mg daily PRN  Has the patient been experiencing any side effects to the medications prescribed?  {YES NO:22349}  Does the patient have any problems obtaining medications due to transportation or finances?   {YES NO:22349}  Understanding of regimen: {excellent/good/fair/poor:19665} Understanding of indications: {excellent/good/fair/poor:19665} Potential of compliance: {excellent/good/fair/poor:19665} Patient understands to avoid NSAIDs. Patient understands to avoid decongestants.    Pertinent Lab Values: 03/12/23: Serum creatinine 1.23, BUN 18, Potassium 3.5, Sodium 136, BNP 151.5  Vital Signs: Weight: *** (last clinic weight: 168.8 lbs) Blood pressure: ***  Heart rate: ***   Assessment/Plan: 1. VT  - Shocks from [employer] ICD (06/08/22), 2/2 hypomag and hypokalemia, ETOH abuse and medication noncompliance. - Continue amiodarone per EP - No recurrent VT  - Device interrogation as above - Amio labs (9/24) ok   2. Chronic systolic CHF - Mostly NICM (? H/o PCI to unknown vessel in 2011.) CTA 2018 normal cors - s/p [employer] ICD - Echo (3/20): EF 25-30% - CPX 9/20 moderate to severe HF limitation. pVO2: 14.9 (52% predicted peak VO2).  slope: 31  pRER: 1.05  - Echo (03/10/19): EF 25-30% RV ok Mild MR - Myoview (5/22): EF 34% fixed inferior defect - Echo (6/24): EF < 20%, RV mildly reduced (in setting of VT), moderate-severe TR, moderate MR.  - RHC (6/24): RA 10, PCWP 11, CO/CI (Thermo) 2.9/1.78. >>started on DBA - CMP likely 2/2 ETOH -> stopped drinking in 6/24  - Echo (10/24): EF 30-35%  - Echo 2/25 EF 25-30%, G1DD, normal RV, mild to moderate MR - NYHA I-II, Volume up a bit today, ReDs up 37%. GDMT recently limited by recent AKI and BP. - Restart Jardiance 10 mg daily - Restart eplerenone  12.5 mg daily - Restart losartan 25 mg daily (ACEi allergy) - Change Lasix 40 mg to PRN - Labs today. Repeat BMET in 7-10 days - Device interrogation as above   3. ETOH abuse - Heavy ETOH use, likely contributes to cardiomyopathy.  - Has cut way back since d/c.  - Congratulated   4. CKD 3  - Baseline SCr 1.3-1.6 - Recent SCr up to 1.7 - Med changes as above - Labs today   5. HTN - BP up today - Med changes as above (previously on losartan 50 daily)   6. CAD - h/o single vessel PCI - Myoview (5/22): EF 34% fixed inferior defect - Recent chest tightness, suspect due to volume - Continue ASA - Restart atorvastatin 20 mg daily - LFTs/lipids in 8 weeks   7. OSA - Unable to tolerate CPAP - No change   - Basic disease state pathophysiology, medication indication, mechanism and side effects reviewed at length with patient and he verbalized understanding  Follow up *** - 3 weeks APP clinic vs pharmacy f/u?   Nils Pyle, PharmD PGY1 Pharmacy Resident  Karle Plumber, PharmD, BCPS, BCCP, CPP Heart Failure Clinic Pharmacist [phone]

## 2023-03-31 ENCOUNTER — Ambulatory Visit (HOSPITAL_COMMUNITY)
Admission: RE | Admit: 2023-03-31 | Discharge: 2023-03-31 | Disposition: A | Discharge status: Home / Self Care | Payer: Self-pay | Source: Ambulatory Visit | Attending: Cardiology | Admitting: Cardiology

## 2023-03-31 ENCOUNTER — Encounter (HOSPITAL_COMMUNITY): Payer: Self-pay

## 2023-03-31 ENCOUNTER — Other Ambulatory Visit (HOSPITAL_COMMUNITY): Payer: Self-pay

## 2023-03-31 VITALS — BP 148/78 | HR 67 | Wt 177.0 lb

## 2023-03-31 DIAGNOSIS — Z79899 Other long term (current) drug therapy: Secondary | ICD-10-CM | POA: Insufficient documentation

## 2023-03-31 DIAGNOSIS — I5022 Chronic systolic (congestive) heart failure: Secondary | ICD-10-CM | POA: Insufficient documentation

## 2023-03-31 DIAGNOSIS — I428 Other cardiomyopathies: Secondary | ICD-10-CM | POA: Insufficient documentation

## 2023-03-31 DIAGNOSIS — G4733 Obstructive sleep apnea (adult) (pediatric): Secondary | ICD-10-CM | POA: Insufficient documentation

## 2023-03-31 DIAGNOSIS — Z955 Presence of coronary angioplasty implant and graft: Secondary | ICD-10-CM | POA: Insufficient documentation

## 2023-03-31 DIAGNOSIS — I5043 Acute on chronic combined systolic (congestive) and diastolic (congestive) heart failure: Secondary | ICD-10-CM

## 2023-03-31 DIAGNOSIS — I2510 Atherosclerotic heart disease of native coronary artery without angina pectoris: Secondary | ICD-10-CM | POA: Insufficient documentation

## 2023-03-31 DIAGNOSIS — F1010 Alcohol abuse, uncomplicated: Secondary | ICD-10-CM | POA: Insufficient documentation

## 2023-03-31 DIAGNOSIS — Z7982 Long term (current) use of aspirin: Secondary | ICD-10-CM | POA: Insufficient documentation

## 2023-03-31 DIAGNOSIS — Z95810 Presence of automatic (implantable) cardiac defibrillator: Secondary | ICD-10-CM | POA: Insufficient documentation

## 2023-03-31 DIAGNOSIS — I130 Hypertensive heart and chronic kidney disease with heart failure and stage 1 through stage 4 chronic kidney disease, or unspecified chronic kidney disease: Secondary | ICD-10-CM | POA: Insufficient documentation

## 2023-03-31 DIAGNOSIS — N183 Chronic kidney disease, stage 3 unspecified: Secondary | ICD-10-CM | POA: Insufficient documentation

## 2023-03-31 DIAGNOSIS — Z7984 Long term (current) use of oral hypoglycemic drugs: Secondary | ICD-10-CM | POA: Insufficient documentation

## 2023-03-31 MED ORDER — LOSARTAN POTASSIUM 50 MG PO TABS: 50.0000 mg | ORAL_TABLET | Freq: Every day | ORAL | 3 refills | Status: DC

## 2023-03-31 MED ORDER — EPLERENONE 25 MG PO TABS: 25.0000 mg | ORAL_TABLET | Freq: Every day | ORAL | 3 refills | Status: AC

## 2023-03-31 NOTE — Patient Instructions (Signed)
 It was a pleasure seeing you today!  MEDICATIONS: -We are changing your medications today -Increase losartan to 50 mg daily. You can use up your current supply by taking two 25 mg tablets, but we also sent a prescription for the 50 mg tablet to the pharmacy for mail order -Take Lasix 40 mg every other day to help with fluid -Call if you have questions about your medications.  NEXT APPOINTMENT: Return to clinic in 3 weeks with APP clinic.  In general, to take care of your heart failure: -Limit your fluid intake to 2 Liters (half-gallon) per day.   -Limit your salt intake to ideally 2-3 grams (2000-3000 mg) per day. -Weigh yourself daily and record, and bring that "weight diary" to your next appointment.  (Weight gain of 2-3 pounds in 1 day typically means fluid weight.) -The medications for your heart are to help your heart and help you live longer.   -Please contact us before stopping any of your heart medications.  Call the clinic at [phone] with questions or to reschedule future appointments.

## 2023-03-31 NOTE — Progress Notes (Signed)
 Advanced Heart Failure Clinic Note  PCP: [name], NP EP: Dr. Lalla Brothers HF Cardiologist: Dr. Gala Romney  HPI:  Stanley [name] is a 58 y.o. male with systolic HF due to NICM d/x'd in 2006 (felt 2/2 HTN/ETOH), ? CAD s/p PCI to unknown vessel 2011 at outside location (cath 2014 without CAD and normal coronaries by CT 07/2016), VT s/p [facility] ICD implant ([place] 2015), noncompliance, chronic noncardiac chest pain, depression, HTN and ETOH abuse.     In 2017 he received inappropriate therapy for ST with ATP (after a work out at [employer] and reported noncompliance with meds) that degenerated into VT and received several shocks. He has had issues with chronic chest pain without evidence for ischemic etiology including normal cath and cardiac CT as above. Echo 03/2016  EF 35-40%, grade 1 DD, mild MR, moderate LAE. Had recurrent ICD shock in 03/2018 in setting of medicine noncompliance. Amio continued. Echo 03/2018 EF 25-30% Normal RV.     Echo 03/2019 demonstrated EF 25-30% , RV ok. S/p Barostim 07/2019. Myoview 05/2020: EF 34% fixed inferior defect   Admitted 06/2022 with recurrent VT. 2/2 intoxication and metabolic derangement (K 3.1, Mag 1.2). Started on amiodarone gtt. Echo showed EF < 20%, RV mildly reduced. Required pressors and ICU monitoring due to worsening respiratory failure and ETOH withdrawal. Underwent RHC showing elevated RA pressures, normal PCWP, mild to moderate PH and decreased output with CI (Thermo) 1.7. DBA gtt started. Drips weaned and amiodarone transitioned to oral. GDMT titrated, but limited by AKI. He was discharged home, weight 136 lbs.   Returned to [facility] for follow up 08/05/22. Overall was feeling great.  Doing well taking his meds. No SOB with walking further distances on flat ground, walks around his apartment. Denied palpitations, abnormal bleeding, syncope, CP, dizziness, edema, or PND/Orthopnea. Appetite was ok. No fever or chills. Weight at home was 138 pounds. Had not  needed Lasix. Struggling with transportation issues due to driving restrictions. Seen by pharmacy in Aug 2024. Switched from spironolactone to eplerenone due to breast tenderness, increased losartan to 25 mg daily, and continued other medications. At appt with Dr. Gala Romney in Oct 2024, instructed to stop digoxin. EF 10/2022 stable at 30-35%. Seen in APP clinic Jan 2025, had been out of losartan and eplerenone x1 mo. Also reported feeling more SOB after stopping digoxin. ReDs elevated to 47%, weight up 10 lbs. Restarted medications. Went to ED on 02/09/23 for fluid overload, BNP 1195, WBC 13.6, K 3.2, Cr 1.47 (up from 1.09) - admitted x2 days and received IV Lasix and abx for suspected CAP (elevated WBC, PCT 0.27) with symptom improvement. EF 25-30%. Due to AKI - patient was instructed to hold digoxin, eplerenone, Lasix (except for PRN), and losartan. These medications have been slowly restarted with improving kidney function. At most recent HF follow-up in APP clinic on 03/05/23, patient reported being out of atorvastatin, Jardiance, and eplerenone. He receives medications via [facility] mail order. He was taking Lasix 40 mg every other day. ReDs up to 37%. He was instructed to restart Jardiance, eplerenone, and losartan. Repeat BMET/BNP on 03/12/23 stable. Patient was enrolled in [place] active through 01/12/24 to help with medication costs, however patient also has Medicare with [employer] (brand medications ~$12).   Today he returns to HF clinic for pharmacist medication titration. At last visit with MD, patient was instructed to restart Jardiance, eplerenone, and losartan. Patient brings all his medication bottles with him, and there are no discrepancies  between his current supply and his fill history. He has been getting his medications via mail order from [facility]. He reports feeling ok, but states that he is miserable overnight. When he lays down, he feels pressure/fluid in his abdomen in addition to SOB and  abdominal pain. He has occasional lightheadedness, which is worse when he does not eat enough. Reports he has started only eating 2 meals per day as he has noticed weight gain recently. He does have throbbing chest pain/chest pressure throughout the night. He took NTG last week when the pain became more severe, and reports that it did improve. He endorses SOB when walking short distances, like to the kitchen and back and into the building today. This has been going on since Jan/Feb 2025. He is monitoring his weight daily - states that he will notice ~3 lbs weight gain over 2 days. He is currently only taking Lasix as needed, was previously taking every other day and reports that symptoms of fluid overload were better. No LEE on exam today. He endorses following a low-sodium diet, mostly cooks at home.   HF Medications: Losartan 25 mg daily Eplerenone 25 mg daily - was listed as 12.5 mg on our medication list, but patient is taking whole tab (25 mg) daily.  Jardiance 10 mg daily Lasix 40 mg daily PRN  Has the patient been experiencing any side effects to the medications prescribed? No  Does the patient have any problems obtaining medications due to transportation or finances? No - Patient was enrolled in [place] active through 01/12/24 to help with medication costs, however patient also has Medicare with [employer] (brand medications ~$12).  Understanding of regimen: excellent Understanding of indications: excellent Potential of compliance: excellent Patient understands to avoid NSAIDs. Patient understands to avoid decongestants.    Pertinent Lab Values: 03/12/23: Serum creatinine 1.23, BUN 18, Potassium 3.5, Sodium 136, BNP 151.5  Vital Signs: Weight: 177 lbs (last clinic weight: 168.8 lbs) Blood pressure: 148/78 mmHg  Heart rate: 67   Assessment/Plan: 1. VT  - Shocks from [employer] ICD (06/08/22), 2/2 hypomag and hypokalemia, ETOH abuse and medication noncompliance. - Continue  amiodarone 200 mg daily per EP - No recurrent VT  - Amio labs (09/2022) ok   2. Chronic systolic CHF - Mostly NICM (H/o PCI to unknown vessel in 2011.) CTA 2018 normal cors - s/p [employer] ICD - Echo (03/2018): EF 25-30% - CPX 09/2018 moderate to severe HF limitation. pVO2: 14.9 (52% predicted peak VO2).  slope: 31  pRER: 1.05  - Echo (03/2019): EF 25-30% RV ok Mild MR - Myoview (05/2020): EF 34% fixed inferior defect - Echo (06/2022): EF < 20%, RV mildly reduced (in setting of VT), moderate-severe TR, moderate MR.  - RHC (06/2022): RA 10, PCWP 11, CO/CI (Thermo) 2.9/1.78. >>started on DBA - CMP likely 2/2 ETOH  - Echo (10/2022): EF 30-35%  - Echo 02/2023 EF 25-30%, G1DD, normal RV, mild to moderate MR - NYHA III, no obvious s/sx of hypervolemia on exam today. Concerned that patient is holding onto more fluid centrally with report of SOB, abdominal pain, overnight when laying flat. Appropriate to escalate diuretic and monitor for symptom improvement. If K remains low-normal at follow-up, can consider potassium supplementation with hx of VT. BP elevated today, will continue titrating ARB.  - Increase Lasix to 40 mg every other day.  - Increase losartan to 50 mg daily- history of angioedema with ACEi and Entresto.  - Continue eplerenone 25 mg daily - Continue  Jardiance 10 mg daily   3. ETOH abuse - Heavy ETOH use, likely contributes to cardiomyopathy.  - Has cut way back since discharge.  - Congratulated   4. CKD 3  - Baseline SCr 1.3-1.6 - Continue Jardiance as above - Should have repeat BMET at follow-up   5. HTN - BP elevated above goal, 130/80 mmHg today - increase losartan as above   6. CAD - h/o single vessel PCI - Myoview (05/2020): EF 34% fixed inferior defect - Recent chest tightness, suspect due to volume - Continue aspirin 81 mg daily - Continue atorvastatin 20 mg daily.  - LFTs/lipids in April 2025. Consider drawing at APP clinic follow-up on 04/29/23 and cancelling lab  appointment on 04/30/23.   7. OSA - Unable to tolerate CPAP - No change   Follow up 04/29/23 with APP Clinic  Nils Pyle, PharmD PGY1 Pharmacy Resident  Karle Plumber, PharmD, BCPS, [facility], CPP Heart Failure Clinic Pharmacist [phone]

## 2023-04-27 NOTE — Progress Notes (Signed)
 ADVANCED HF CLINIC NOTE PCP: Collins Dean, NP EP: Dr. Marven Slimmer HF Cardiologist: Dr. Julane Ny  Reason for Visit: Heart Failure Follow-up HPI: Stanley [name] is a 58 y.o. male with systolic HF due to NICM d/x'd in 2006 (felt 2/2 HTN/ETOH), ? CAD s/p PCI to unknown vessel 2011 at outside location (cath 2014 without CAD and normal coronaries by CT 07/2016), VT s/p [facility] ICD implant ([place] 2015), noncompliance, chronic noncardiac chest pain, depression, HTN and ETOH abuse.    In 2017 he received inappropriate therapy for ST with ATP (after a work out at [employer] and reported noncompliance with meds) that degenerated into VT and received several shocks. He has had issues with chronic chest pain without evidence for ischemic etiology including normal cath and cardiac CT as above. This is relatively unchanged. Echo 03/2016  EF 35-40%, grade 1 DD, mild MR, moderate LAE  Had recurrent ICD shock in 3/20 in setting of medicine noncompliance. Amio continued. Echo 3/20 EF 25-30% Normal RV.   Echo 3/21: EF 25-30% RV ok S/p Barostim 7/21 Myoview  5/22: EF 34% fixed inferior defect  Admitted 6/24 with recurrent VT. 2/2 intoxication and metabolic derangement (K 3.1, Mag 1.2). Started on amiodarone  gtt. Echo showed EF < 20%, RV mildly reduced. Required pressors and ICU monitoring due to worsening respiratory failure and ETOH withdrawal. Underwent RHC showing elevated RA pressures, normal PCWP, mild to moderate PAH and decreased output with CI (Thermo) 1.7. DBA gtt started. Drips weaned and amio transitioned to oral. GDMT titrated, but limited by AKI. He was discharged home, weight 136 lbs.  Echo 10/08/22 EF 30-35% RV ok. Mild to moderate MR   Seen in ED 02/10/23 with a/c HF & CAP. Echo showed EF 25-30%, G1DD, normal RV, mild to moderate MR. Diuresed with IV lasix  and treated with abx. Dig, losartan , lasix  and Inspra  held with elevated SCr of 1.7  Today he returns for HF follow up. Overall feeling fine.  Feels depressed, PCP arranging counseling. Had episode of SOB last night and chest tightness that resolved after 5 minutes. Otherwise no dyspnea. Abdomen tight, but no swelling. Light headed a couple days ago, he vomited 2/2 to GERD episode. Denies palpitations, abnormal bleeding, edema, or PND/Orthopnea. Appetite ok. No fever or chills. Weight at home 148 pounds. [facility] delivers meds and he has not received several of his meds; not taking atorvastatin , Jardiance  or Inspra . Not wearing CPAP. Taking Lasix  40 mgevery other day. Drinking wine on weekends.   He returns today for heart failure follow up. Overall feeling ***. NYHA ***. Reports {Symptoms; cardiac:12860::"dyspnea","fatigue"}. Denies {Symptoms; cardiac:12860::"chest pain","dyspnea","fatigue","near-syncope","orthopnea","palpitations","dizziness","abnormal bleeding"}. Able to perform ADLs. Appetite okay. Weight at home ***. BP at home***. Compliant with all medications.     Cardiac Studies - Echo (2/25): EF 25-30%, G1DD, normal RV, mild to moderate MR  - Echo (10/24): EF 30-35%, RV ok, mild to moderate MR  - RHC (6/24): RA mean 10, PA 50/23 (mean 34), PCWP mean 11, CO/CI (Fick) 3.87/2.25, CO/CI (thermo) 2.92/1.78,  PVR 5.9  WU, PAPi 2.7  - Echo (6/24): EF < 20%, grade I DD, RV mildly reduced, moderate MR, moderate to severe TR  - Myoview  (5/22): EF 34% fixed inferior defect  - S/p Barostim (7/21)  - Echo (3/21): EF 25-30% RV ok  - CPX (09/19/18) FVC 1.96 (57%)      FEV1 1.60 (58%)        FEV1/FVC 81 (101%)        MVV 80 (61%)  Resting HR: 77 Peak HR: 131   (79% age predicted max HR)  BP rest: 168/80          Standing BP: 156/74 BP peak: 196/80   Peak VO2: 14.9 (52% predicted peak VO2)  VE/VCO2 slope:  31  OUES: 1.43  Peak RER: 1.05  VE/MVV:  50% O2pulse:  10   (67% predicted O2pulse)  Moderate to severe HF limitation but normal VeVCO2 slope is reassuring. Restrictive lung physiology.   - Echo (3/20) EF 25-30% Normal  RV.   - Echo (3/18):  EF 35-40%, grade 1 DD, mild MR, moderate LAE  Past Medical History:  Diagnosis Date   Acute renal failure (ARF) (HCC)    Acute respiratory failure with hypoxia (HCC) 12/29/2018   Alcohol abuse    Anxiety    CAD (coronary artery disease)    a. dx unclear, reported PCI in 2011 but cath 2014 normal coronaries and cardiac CT 07/2016 normal coronaries, no mention of stents.   Chronic chest pain    Chronic systolic CHF (congestive heart failure) (HCC)    Depression    Dyspnea    07/05/2019: per patient some times with exertion, "has to catch breath"   Family history of breast cancer 05/30/2021   Family history of pancreatic cancer 05/30/2021   Financial difficulties    Gout    Gouty arthritis    Headache    History of noncompliance with medical treatment    financial challenges   Hypertension    ICD (implantable cardioverter-defibrillator) in place    [place] [employer] ICD implant done in [place] 2015   Insomnia    Lobar pneumonia (HCC) 12/28/2018   Myocardial infarction (HCC) 2005   Nonischemic cardiomyopathy (HCC)    Sleep apnea    per patient, has CPAP does not wear it every night   Ventricular tachycardia (HCC) 01/2015   2017 - ATP delivered for sinus tach, degenerated into VT for which he received shock. Recurrent VT 03/2018.   Current Outpatient Medications  Medication Sig Dispense Refill   allopurinol  (ZYLOPRIM ) 300 MG tablet TAKE 1 TABLET EVERY DAY (NEED MD APPOINTMENT) 30 tablet 11   amiodarone  (PACERONE ) 200 MG tablet Take 1 tablet (200 mg total) by mouth daily. 90 tablet 1   aspirin  EC 81 MG tablet Take 81 mg by mouth daily. Swallow whole.     atorvastatin  (LIPITOR) 20 MG tablet Take 1 tablet (20 mg total) by mouth daily. 90 tablet 3   empagliflozin  (JARDIANCE ) 10 MG TABS tablet Take 1 tablet (10 mg total) by mouth daily. 90 tablet 3   eplerenone  (INSPRA ) 25 MG tablet Take 1 tablet (25 mg total) by mouth daily. 90 tablet 3   furosemide  (LASIX ) 40 MG tablet Take  1 tablet (40 mg total) by mouth as needed. For weight gain of 3 lbs in 24 hours or 5 lbs in a week 90 tablet 3   losartan  (COZAAR ) 50 MG tablet Take 1 tablet (50 mg total) by mouth daily. 90 tablet 3   nitroGLYCERIN  (NITROSTAT ) 0.4 MG SL tablet Place 1 tablet (0.4 mg total) under the tongue every 5 (five) minutes for 3 doses as needed for chest pain. 75 each 6   traZODone  (DESYREL ) 150 MG tablet TAKE 1 TABLET AT BEDTIME 90 tablet 3   No current facility-administered medications for this visit.   Allergies  Allergen Reactions   Lisinopril Shortness Of Breath    Makes lips swell   Allopurinol  Nausea Only    dizziness  Entresto  [Sacubitril -Valsartan ] Other (See Comments)    History of angioedema   Other Swelling    Nuts - Lip Swelling   Shellfish Allergy Swelling    Lip Swelling   Spironolactone  Other (See Comments)    Breast tenderness   Social History   Socioeconomic History   Marital status: Divorced    Spouse name: Not on file   Number of children: 1   Years of education: 12   Highest education level: Not on file  Occupational History   Occupation: Disability  Tobacco Use   Smoking status: Never   Smokeless tobacco: Never  Vaping Use   Vaping status: Never Used  Substance and Sexual Activity   Alcohol use: Not Currently    Comment: Last drink before 06/08/2022   Drug use: No   Sexual activity: Not Currently  Other Topics Concern   Not on file  Social History Narrative   Lives in [place] alone.  Disabled.  Previously worked as a Geophysicist/field seismologist.   Fun: Rest, Read and watch movies.    Social Drivers of Health   Financial Resource Strain: High Risk (06/30/2022)   Overall Financial Resource Strain (CARDIA)    Difficulty of Paying Living Expenses: Hard  Food Insecurity: Food Insecurity Present (02/11/2023)   Hunger Vital Sign    Worried About Running Out of Food in the Last Year: Sometimes true    Ran Out of Food in the Last Year: Sometimes true  Transportation  Needs: Unmet Transportation Needs (02/11/2023)   PRAPARE - Administrator, Civil Service (Medical): Yes    Lack of Transportation (Non-Medical): No  Physical Activity: Unknown (03/21/2018)   Exercise Vital Sign    Days of Exercise per Week: Patient declined    Minutes of Exercise per Session: Patient declined  Stress: Stress Concern Present (03/21/2018)   [employer] of Occupational Health - Occupational Stress Questionnaire    Feeling of Stress : To some extent  Social Connections: Unknown (03/21/2018)   Social Connection and Isolation Panel [NHANES]    Frequency of Communication with Friends and Family: Patient declined    Frequency of Social Gatherings with Friends and Family: Patient declined    Attends Religious Services: Patient declined    Database administrator or Organizations: Patient declined    Attends Banker Meetings: Patient declined    Marital Status: Patient declined  Intimate Partner Violence: Not At Risk (02/11/2023)   Humiliation, Afraid, Rape, and Kick questionnaire    Fear of Current or Ex-Partner: No    Emotionally Abused: No    Physically Abused: No    Sexually Abused: No   Family History  Problem Relation Age of Onset   Breast cancer Mother    Pancreatic cancer Mother    Hypertension Father    Alzheimer's disease Father    Gout Father    Dementia Father    Hypertension Sister    Clotting disorder Sister    Obesity Brother    Bronchitis Brother    Cancer Maternal Uncle        unknown type; dx after 16; x2 maternal uncles   Breast cancer Paternal Aunt        dx after 50   Lung cancer Paternal Uncle    Heart disease Maternal Grandmother    Gout Paternal Grandfather    Colon polyps Neg Hx    Colon cancer Neg Hx    Esophageal cancer Neg Hx    Stomach cancer Neg Hx  There were no vitals taken for this visit.  Wt Readings from Last 3 Encounters:  03/31/23 80.3 kg (177 lb)  03/05/23 76.6 kg (168 lb 12.8 oz)  02/16/23  79.6 kg (175 lb 6.4 oz)   PHYSICAL EXAM: General:  NAD. No resp difficulty, walked into clinic HEENT: Normal Neck: Supple. JVP 10 Cor: Regular rate & rhythm. No rubs, gallops or murmurs. Lungs: Clear Abdomen: Soft, nontender, + distended.  Extremities: No cyanosis, clubbing, rash, edema Neuro: Alert & oriented x 3, moves all 4 extremities w/o difficulty. Affect pleasant.  ReDs reading: 37%, abnormal  Device interrogation (personally reviewed): No VT. Activity 2.2 hr/day, average HR 79 bpm   ASSESSMENT & PLAN:  1. VT  - Shocks from [employer] ICD (06/08/22), 2/2 hypomag and hypokalemia, ETOH abuse and medication noncompliance. - Continue amiodarone  per EP - No recurrent VT  - Device interrogation as above - Amio labs (9/24) ok  2. Chronic systolic CHF - Mostly NICM (? H/o PCI to unknown vessel in 2011.) CTA 2018 normal cors - s/p [employer] ICD - Echo (3/20): EF 25-30% - CPX 9/20 moderate to severe HF limitation. pVO2: 14.9 (52% predicted peak VO2).  slope: 31  pRER: 1.05  - Echo (03/10/19): EF 25-30% RV ok Mild MR - Myoview  (5/22): EF 34% fixed inferior defect - Echo (6/24): EF < 20%, RV mildly reduced (in setting of VT), moderate-severe TR, moderate MR.  - RHC (6/24): RA 10, PCWP 11, CO/CI (Thermo) 2.9/1.78. >>started on DBA - CMP likely 2/2 ETOH -> stopped drinking in 6/24  - Echo (10/24): EF 30-35%  - Echo 2/25 EF 25-30%, G1DD, normal RV, mild to moderate MR - NYHA I-II, Volume up a bit today, ReDs up 37%. GDMT recently limited by recent AKI and BP. - Restart Jardiance  10 mg daily - Restart eplerenone  12.5 mg daily - Restart losartan  25 mg daily (ACEi allergy) - Change Lasix  40 mg to PRN - Labs today. Repeat BMET in 7-10 days - Device interrogation as above  3. ETOH abuse - Heavy ETOH use, likely contributes to cardiomyopathy.  - Has cut way back since d/c.  - Congratulated  4. CKD 3  - Baseline SCr 1.3-1.6 - Recent SCr up to 1.7 - Med changes as above - Labs  today  5. HTN - BP up today - Med changes as above (previously on losartan  50 daily)  6. CAD - h/o single vessel PCI - Myoview  (5/22): EF 34% fixed inferior defect - Recent chest tightness, suspect due to volume - Continue ASA - Restart atorvastatin  20 mg daily - LFTs/lipids in 8 weeks  7. OSA - Unable to tolerate CPAP - No change  Follow up in 3-4 weeks with PharmD for med/BP check, and 4 months with Dr. Julane Ny  [place] Weslee Fogg, NP  4:21 PM

## 2023-04-29 ENCOUNTER — Ambulatory Visit (HOSPITAL_COMMUNITY)
Admission: RE | Admit: 2023-04-29 | Discharge: 2023-04-29 | Disposition: A | Discharge status: Home / Self Care | Payer: Self-pay | Source: Ambulatory Visit | Attending: Cardiology | Admitting: Cardiology

## 2023-04-29 ENCOUNTER — Encounter (HOSPITAL_COMMUNITY): Payer: Self-pay

## 2023-04-29 VITALS — BP 132/72 | HR 69 | Ht 65.0 in | Wt 183.0 lb

## 2023-04-29 DIAGNOSIS — R079 Chest pain, unspecified: Secondary | ICD-10-CM | POA: Insufficient documentation

## 2023-04-29 DIAGNOSIS — Z5982 Transportation insecurity: Secondary | ICD-10-CM | POA: Insufficient documentation

## 2023-04-29 DIAGNOSIS — I428 Other cardiomyopathies: Secondary | ICD-10-CM | POA: Insufficient documentation

## 2023-04-29 DIAGNOSIS — I159 Secondary hypertension, unspecified: Secondary | ICD-10-CM

## 2023-04-29 DIAGNOSIS — F1010 Alcohol abuse, uncomplicated: Secondary | ICD-10-CM | POA: Insufficient documentation

## 2023-04-29 DIAGNOSIS — G4733 Obstructive sleep apnea (adult) (pediatric): Secondary | ICD-10-CM | POA: Insufficient documentation

## 2023-04-29 DIAGNOSIS — I130 Hypertensive heart and chronic kidney disease with heart failure and stage 1 through stage 4 chronic kidney disease, or unspecified chronic kidney disease: Secondary | ICD-10-CM | POA: Insufficient documentation

## 2023-04-29 DIAGNOSIS — I5022 Chronic systolic (congestive) heart failure: Secondary | ICD-10-CM | POA: Insufficient documentation

## 2023-04-29 DIAGNOSIS — Z8249 Family history of ischemic heart disease and other diseases of the circulatory system: Secondary | ICD-10-CM | POA: Insufficient documentation

## 2023-04-29 DIAGNOSIS — I2510 Atherosclerotic heart disease of native coronary artery without angina pectoris: Secondary | ICD-10-CM | POA: Insufficient documentation

## 2023-04-29 DIAGNOSIS — Z803 Family history of malignant neoplasm of breast: Secondary | ICD-10-CM

## 2023-04-29 DIAGNOSIS — Z79899 Other long term (current) drug therapy: Secondary | ICD-10-CM | POA: Insufficient documentation

## 2023-04-29 DIAGNOSIS — Z5986 Financial insecurity: Secondary | ICD-10-CM | POA: Insufficient documentation

## 2023-04-29 DIAGNOSIS — I472 Ventricular tachycardia, unspecified: Secondary | ICD-10-CM

## 2023-04-29 DIAGNOSIS — Z955 Presence of coronary angioplasty implant and graft: Secondary | ICD-10-CM | POA: Insufficient documentation

## 2023-04-29 DIAGNOSIS — Z95810 Presence of automatic (implantable) cardiac defibrillator: Secondary | ICD-10-CM | POA: Insufficient documentation

## 2023-04-29 DIAGNOSIS — N1831 Chronic kidney disease, stage 3a: Secondary | ICD-10-CM | POA: Insufficient documentation

## 2023-04-29 LAB — LIPID PANEL
Cholesterol: 167 mg/dL (ref 0–200)
HDL: 64 mg/dL (ref >40)
LDL Cholesterol: 79 mg/dL (ref 0–99)
Total CHOL/HDL Ratio: 2.6 ratio
Triglycerides: 120 mg/dL (ref <150)
VLDL: 24 mg/dL (ref 0–40)

## 2023-04-29 LAB — COMPREHENSIVE METABOLIC PANEL WITH GFR
ALT: 44 U/L (ref 0–44)
AST: 42 U/L — ABNORMAL HIGH (ref 15–41)
Albumin: 3.5 g/dL (ref 3.5–5.0)
Alkaline Phosphatase: 52 U/L (ref 38–126)
Anion gap: 9 (ref 5–15)
BUN: 21 mg/dL — ABNORMAL HIGH (ref 6–20)
CO2: 20 mmol/L — ABNORMAL LOW (ref 22–32)
Calcium: 8.9 mg/dL (ref 8.9–10.3)
Chloride: 108 mmol/L (ref 98–111)
Creatinine, Ser: 1.53 mg/dL — ABNORMAL HIGH (ref 0.61–1.24)
GFR, Estimated: 52 mL/min — ABNORMAL LOW (ref >60)
Glucose, Bld: 73 mg/dL (ref 70–99)
Potassium: 4 mmol/L (ref 3.5–5.1)
Sodium: 137 mmol/L (ref 135–145)
Total Bilirubin: 0.6 mg/dL (ref 0.0–1.2)
Total Protein: 7.4 g/dL (ref 6.5–8.1)

## 2023-04-29 LAB — BRAIN NATRIURETIC PEPTIDE: B Natriuretic Peptide: 214.9 pg/mL — ABNORMAL HIGH (ref 0.0–100.0)

## 2023-04-29 MED ORDER — FUROSEMIDE 40 MG PO TABS
40.0000 mg | ORAL_TABLET | Freq: Every day | ORAL | Status: DC
Start: 2023-04-29 — End: 2023-09-01

## 2023-04-29 NOTE — Patient Instructions (Signed)
 Medication Changes:  INCREASE LASIX  (FUROSEMIDE ) TO 40MG  ONCE DAILY  TAKE 80MG  (2) TABLETS TOMORROW ONLY THEN RETURN TO 40MG  DAILY   Lab Work:  Labs done today, your results will be available in MyChart, we will contact you for abnormal readings.  THEN RETURN IN 2 WEEKS AS SCHEDULED FOR REPEAT LABS   Follow-Up in: 2-3 WEEKS WITH APP AS SCHEDULED   At the [facility], you and your health needs are our priority. We have a designated team specialized in the treatment of Heart Failure. This Care Team includes your primary Heart Failure Specialized Cardiologist (physician), Advanced Practice Providers (APPs- Physician Assistants and Nurse Practitioners), and Pharmacist who all work together to provide you with the care you need, when you need it.   You may see any of the following providers on your designated Care Team at your next follow up:  Dr. Jules Oar Dr. Peder Bourdon Dr. Alwin Baars Dr. Judyth Nunnery Nieves Bars, NP Ruddy Corral, [place] [facility] [place], [place] Dennise Fitz, NP [place] Lee, NP Luster Salters, PharmD   Please be sure to bring in all your medications bottles to every appointment.   Need to Contact Us :  If you have any questions or concerns before your next appointment please send us  a message through [facility] or call our office at [phone].    TO LEAVE A MESSAGE FOR THE NURSE SELECT OPTION 2, PLEASE LEAVE A MESSAGE INCLUDING: YOUR NAME DATE OF BIRTH CALL BACK NUMBER REASON FOR CALL**this is important as we prioritize the call backs  YOU WILL RECEIVE A CALL BACK THE SAME DAY AS LONG AS YOU CALL BEFORE 4:00 PM

## 2023-04-29 NOTE — Progress Notes (Signed)
 ReDS Vest / Clip - 04/29/23 1400       ReDS Vest / Clip   Station Marker B    Ruler Value 33    ReDS Value Range Low volume    ReDS Actual Value 28

## 2023-04-30 ENCOUNTER — Other Ambulatory Visit (HOSPITAL_COMMUNITY): Payer: Self-pay

## 2023-05-06 ENCOUNTER — Ambulatory Visit (HOSPITAL_COMMUNITY)
Admission: RE | Admit: 2023-05-06 | Discharge: 2023-05-06 | Disposition: A | Discharge status: Home / Self Care | Payer: Self-pay | Source: Ambulatory Visit | Attending: Cardiology | Admitting: Cardiology

## 2023-05-06 DIAGNOSIS — I5022 Chronic systolic (congestive) heart failure: Secondary | ICD-10-CM | POA: Insufficient documentation

## 2023-05-06 LAB — BASIC METABOLIC PANEL WITH GFR
Anion gap: 14 (ref 5–15)
BUN: 21 mg/dL — ABNORMAL HIGH (ref 6–20)
CO2: 22 mmol/L (ref 22–32)
Calcium: 9.6 mg/dL (ref 8.9–10.3)
Chloride: 100 mmol/L (ref 98–111)
Creatinine, Ser: 1.6 mg/dL — ABNORMAL HIGH (ref 0.61–1.24)
GFR, Estimated: 49 mL/min — ABNORMAL LOW (ref >60)
Glucose, Bld: 92 mg/dL (ref 70–99)
Potassium: 3.4 mmol/L — ABNORMAL LOW (ref 3.5–5.1)
Sodium: 136 mmol/L (ref 135–145)

## 2023-05-07 NOTE — Progress Notes (Signed)
 Remote ICD transmission.

## 2023-05-07 NOTE — Addendum Note (Signed)
 Addended by: Lott Rouleau A on: 05/07/2023 01:48 PM   Modules accepted: Orders

## 2023-05-11 ENCOUNTER — Other Ambulatory Visit: Payer: Self-pay

## 2023-05-11 ENCOUNTER — Telehealth (HOSPITAL_COMMUNITY): Payer: Self-pay

## 2023-05-11 ENCOUNTER — Other Ambulatory Visit (HOSPITAL_COMMUNITY): Payer: Self-pay

## 2023-05-11 ENCOUNTER — Encounter: Payer: Self-pay | Admitting: Cardiology

## 2023-05-11 MED ORDER — POTASSIUM CHLORIDE CRYS ER 20 MEQ PO TBCR
20.0000 meq | EXTENDED_RELEASE_TABLET | Freq: Every day | ORAL | 3 refills | Status: DC
  Filled 2023-05-11: qty 30, 30d supply, fill #0
  Filled 2023-07-28: qty 30, 30d supply, fill #1

## 2023-05-11 NOTE — Telephone Encounter (Signed)
-----   Message from [place] Lee sent at 05/11/2023  7:08 AM EDT ----- Potassium low. Start KCL 20 mEq daily.

## 2023-05-11 NOTE — Telephone Encounter (Signed)
 Spoke with patient regarding the following results. Patient made aware and patient verbalized understanding.   Medication sent to patient preferred pharmacy. Advised patient to call back to office with any issues, questions, or concerns. Patient verbalized understanding.

## 2023-05-17 ENCOUNTER — Ambulatory Visit: Payer: Self-pay | Admitting: Nurse Practitioner

## 2023-05-20 ENCOUNTER — Encounter (HOSPITAL_COMMUNITY): Payer: Self-pay

## 2023-06-15 ENCOUNTER — Telehealth (HOSPITAL_COMMUNITY): Payer: Self-pay | Admitting: Licensed Clinical Social Worker

## 2023-06-15 NOTE — Telephone Encounter (Signed)
 CSW received call form patient that he was struggling financially and requested assistance from the Patient Care Fund. Patient lives alone and has limited income from Disability. He states he has had added medical bills these past few months and just got behind on his [facility] bill. Patient receives food stamps so will have presumptive eligibility for assistance. Patient came to clinic to provide requested documents and sign acknowledgement form. CSW will submit for assistance to the Patient Care Fund. Patient grateful for the assistance. Aubry Blase, LCSW, CCSW-MCS [phone]

## 2023-06-16 NOTE — Progress Notes (Deleted)
  Cardiology Office Note:   Date:  06/16/2023  ID:  Stanley [name], DOB 29-Mar-1965, MRN [id number]  Primary Cardiologist: Jules Oar, MD Electrophysiologist: Boyce Byes, MD   History of Present Illness:   Stanley [name] is a 57 y.o. male with h/o HF due to NICM, ETOH abuse, h/o VT, h/o non-compliance, atypical chest pain, depression, and HTN. Pt also reportedly has CAD with PCI to unknown vessel 2011, but cath 2014 without CAD and normal cors by CT 07/2016. Pt is being seen today for routine electrophysiology followup.   Seen in HF clinic 04/29/2023 and was doing OK.  Since last being seen in our clinic the patient reports doing ***.  he denies chest pain, palpitations, dyspnea, PND, orthopnea, nausea, vomiting, dizziness, syncope, edema, weight gain, or early satiety.     Review of systems complete and found to be negative unless listed in HPI.    EP Information / Studies Reviewed:    EKG is not ordered today. EKG from 02/09/2023 reviewed which showed NSR at 98 bpm  ***  Arrhythmia/Device History ICD/BS Latitude  HF WU:[id number] Bensimhon   Barostim Interrogation- Performed personally and reviewed in detail today,  See scanned report  ICD/PPM interrogation - Not performed today. See last PaceArt report    Physical Exam:   VS:  There were no vitals taken for this visit.   Wt Readings from Last 3 Encounters:  04/29/23 183 lb (83 kg)  03/31/23 177 lb (80.3 kg)  03/05/23 168 lb 12.8 oz (76.6 kg)     GEN: Well nourished, well developed in no acute distress NECK: No JVD; No carotid bruits CARDIAC: {EPRHYTHM:28826}, no murmurs, rubs, gallops RESPIRATORY:  Clear to auscultation without rales, wheezing or rhonchi  ABDOMEN: Soft, non-tender, non-distended EXTREMITIES:  {EDEMA LEVEL:28147::No} edema; No deformity   ASSESSMENT AND PLAN:    Chronic systolic CHF s/p Chief Financial Officer and Barostim implantation NYHA *** symptoms. ***  Device {Blank  single:19197::titrated from *** millamp to *** milliamp by increments of 0.4 with good blood pressure response and no stimulation.,programmed at *** for chronic settings}  Device impedence stable. Pt goals are to ***  Normal device function See scanned report. Will follow up in *** weeks to continue titration with goal of 6-8 milliamps for chronic settings.  ICD stable at in person check 09/2022 and on remotes. Checked in HF clinic 05/11/2023  VT Quiescent on amiodarone  200 mg daily  HTN Stable on current regimen   Atypical chest pain Chronic, work up has been reassuring.    Disposition:   Follow up with {EPPROVIDERS:28135::EP Team} in {EPFOLLOW UP:28173}  Signed, Tylene Galla, PA-C

## 2023-06-18 ENCOUNTER — Ambulatory Visit: Payer: Self-pay | Admitting: Student

## 2023-06-18 DIAGNOSIS — I5022 Chronic systolic (congestive) heart failure: Secondary | ICD-10-CM

## 2023-06-18 DIAGNOSIS — I10 Essential (primary) hypertension: Secondary | ICD-10-CM

## 2023-06-18 DIAGNOSIS — R079 Chest pain, unspecified: Secondary | ICD-10-CM

## 2023-06-18 DIAGNOSIS — I472 Ventricular tachycardia, unspecified: Secondary | ICD-10-CM

## 2023-06-23 ENCOUNTER — Ambulatory Visit (INDEPENDENT_AMBULATORY_CARE_PROVIDER_SITE_OTHER): Payer: Medicaid Other

## 2023-06-23 DIAGNOSIS — I5022 Chronic systolic (congestive) heart failure: Secondary | ICD-10-CM

## 2023-06-24 ENCOUNTER — Inpatient Hospital Stay (HOSPITAL_COMMUNITY): Payer: Self-pay

## 2023-06-24 ENCOUNTER — Telehealth: Payer: Self-pay

## 2023-06-24 ENCOUNTER — Other Ambulatory Visit: Payer: Self-pay

## 2023-06-24 ENCOUNTER — Encounter (HOSPITAL_COMMUNITY): Payer: Self-pay

## 2023-06-24 ENCOUNTER — Emergency Department (HOSPITAL_COMMUNITY): Payer: Self-pay

## 2023-06-24 ENCOUNTER — Inpatient Hospital Stay (HOSPITAL_COMMUNITY)
Admission: EM | Admit: 2023-06-24 | Discharge: 2023-06-25 | DRG: 309 | Disposition: A | Discharge status: Home / Self Care | Payer: Self-pay | Attending: Cardiology | Admitting: Cardiology

## 2023-06-24 DIAGNOSIS — Z91013 Allergy to seafood: Secondary | ICD-10-CM | POA: Diagnosis not present

## 2023-06-24 DIAGNOSIS — I130 Hypertensive heart and chronic kidney disease with heart failure and stage 1 through stage 4 chronic kidney disease, or unspecified chronic kidney disease: Secondary | ICD-10-CM | POA: Diagnosis present

## 2023-06-24 DIAGNOSIS — I472 Ventricular tachycardia, unspecified: Secondary | ICD-10-CM | POA: Diagnosis present

## 2023-06-24 DIAGNOSIS — F419 Anxiety disorder, unspecified: Secondary | ICD-10-CM | POA: Diagnosis present

## 2023-06-24 DIAGNOSIS — F32A Depression, unspecified: Secondary | ICD-10-CM | POA: Diagnosis present

## 2023-06-24 DIAGNOSIS — R008 Other abnormalities of heart beat: Secondary | ICD-10-CM | POA: Diagnosis present

## 2023-06-24 DIAGNOSIS — M109 Gout, unspecified: Secondary | ICD-10-CM | POA: Diagnosis present

## 2023-06-24 DIAGNOSIS — I447 Left bundle-branch block, unspecified: Secondary | ICD-10-CM | POA: Diagnosis present

## 2023-06-24 DIAGNOSIS — Z79899 Other long term (current) drug therapy: Secondary | ICD-10-CM | POA: Diagnosis not present

## 2023-06-24 DIAGNOSIS — Z7982 Long term (current) use of aspirin: Secondary | ICD-10-CM

## 2023-06-24 DIAGNOSIS — Z801 Family history of malignant neoplasm of trachea, bronchus and lung: Secondary | ICD-10-CM

## 2023-06-24 DIAGNOSIS — I2510 Atherosclerotic heart disease of native coronary artery without angina pectoris: Secondary | ICD-10-CM | POA: Diagnosis present

## 2023-06-24 DIAGNOSIS — R0789 Other chest pain: Secondary | ICD-10-CM | POA: Diagnosis present

## 2023-06-24 DIAGNOSIS — Z800 Family history of malignant neoplasm of digestive organs: Secondary | ICD-10-CM

## 2023-06-24 DIAGNOSIS — I5022 Chronic systolic (congestive) heart failure: Secondary | ICD-10-CM | POA: Diagnosis present

## 2023-06-24 DIAGNOSIS — R42 Dizziness and giddiness: Secondary | ICD-10-CM | POA: Diagnosis present

## 2023-06-24 DIAGNOSIS — E876 Hypokalemia: Secondary | ICD-10-CM | POA: Diagnosis present

## 2023-06-24 DIAGNOSIS — Z888 Allergy status to other drugs, medicaments and biological substances status: Secondary | ICD-10-CM

## 2023-06-24 DIAGNOSIS — I493 Ventricular premature depolarization: Secondary | ICD-10-CM | POA: Diagnosis present

## 2023-06-24 DIAGNOSIS — I428 Other cardiomyopathies: Secondary | ICD-10-CM | POA: Diagnosis present

## 2023-06-24 DIAGNOSIS — Z7984 Long term (current) use of oral hypoglycemic drugs: Secondary | ICD-10-CM | POA: Diagnosis not present

## 2023-06-24 DIAGNOSIS — G4733 Obstructive sleep apnea (adult) (pediatric): Secondary | ICD-10-CM | POA: Diagnosis present

## 2023-06-24 DIAGNOSIS — Z8249 Family history of ischemic heart disease and other diseases of the circulatory system: Secondary | ICD-10-CM

## 2023-06-24 DIAGNOSIS — N1831 Chronic kidney disease, stage 3a: Secondary | ICD-10-CM | POA: Diagnosis present

## 2023-06-24 DIAGNOSIS — F1010 Alcohol abuse, uncomplicated: Secondary | ICD-10-CM | POA: Diagnosis present

## 2023-06-24 DIAGNOSIS — Z95810 Presence of automatic (implantable) cardiac defibrillator: Secondary | ICD-10-CM

## 2023-06-24 DIAGNOSIS — D869 Sarcoidosis, unspecified: Secondary | ICD-10-CM | POA: Diagnosis present

## 2023-06-24 DIAGNOSIS — I252 Old myocardial infarction: Secondary | ICD-10-CM

## 2023-06-24 DIAGNOSIS — G8929 Other chronic pain: Secondary | ICD-10-CM | POA: Diagnosis present

## 2023-06-24 DIAGNOSIS — Z4502 Encounter for adjustment and management of automatic implantable cardiac defibrillator: Principal | ICD-10-CM

## 2023-06-24 DIAGNOSIS — Z955 Presence of coronary angioplasty implant and graft: Secondary | ICD-10-CM | POA: Diagnosis not present

## 2023-06-24 DIAGNOSIS — Z820 Family history of epilepsy and other diseases of the nervous system: Secondary | ICD-10-CM

## 2023-06-24 DIAGNOSIS — Z803 Family history of malignant neoplasm of breast: Secondary | ICD-10-CM

## 2023-06-24 DIAGNOSIS — Z825 Family history of asthma and other chronic lower respiratory diseases: Secondary | ICD-10-CM

## 2023-06-24 DIAGNOSIS — Z832 Family history of diseases of the blood and blood-forming organs and certain disorders involving the immune mechanism: Secondary | ICD-10-CM

## 2023-06-24 LAB — CBC WITH DIFFERENTIAL/PLATELET
Abs Immature Granulocytes: 0.07 10*3/uL (ref 0.00–0.07)
Basophils Absolute: 0.1 10*3/uL (ref 0.0–0.1)
Basophils Relative: 1 %
Eosinophils Absolute: 0.1 10*3/uL (ref 0.0–0.5)
Eosinophils Relative: 1 %
HCT: 47 % (ref 39.0–52.0)
Hemoglobin: 15.1 g/dL (ref 13.0–17.0)
Immature Granulocytes: 1 %
Lymphocytes Relative: 17 %
Lymphs Abs: 1.7 10*3/uL (ref 0.7–4.0)
MCH: 31.8 pg (ref 26.0–34.0)
MCHC: 32.1 g/dL (ref 30.0–36.0)
MCV: 98.9 fL (ref 80.0–100.0)
Monocytes Absolute: 0.7 10*3/uL (ref 0.1–1.0)
Monocytes Relative: 7 %
Neutro Abs: 7.7 10*3/uL (ref 1.7–7.7)
Neutrophils Relative %: 73 %
Platelets: 223 10*3/uL (ref 150–400)
RBC: 4.75 MIL/uL (ref 4.22–5.81)
RDW: 12.7 % (ref 11.5–15.5)
WBC: 10.3 10*3/uL (ref 4.0–10.5)
nRBC: 0.2 % (ref 0.0–0.2)

## 2023-06-24 LAB — BRAIN NATRIURETIC PEPTIDE: B Natriuretic Peptide: 336.8 pg/mL — ABNORMAL HIGH (ref 0.0–100.0)

## 2023-06-24 LAB — COMPREHENSIVE METABOLIC PANEL WITH GFR
ALT: 54 U/L — ABNORMAL HIGH (ref 0–44)
AST: 41 U/L (ref 15–41)
Albumin: 3.9 g/dL (ref 3.5–5.0)
Alkaline Phosphatase: 52 U/L (ref 38–126)
Anion gap: 11 (ref 5–15)
BUN: 24 mg/dL — ABNORMAL HIGH (ref 6–20)
CO2: 20 mmol/L — ABNORMAL LOW (ref 22–32)
Calcium: 9.3 mg/dL (ref 8.9–10.3)
Chloride: 105 mmol/L (ref 98–111)
Creatinine, Ser: 1.63 mg/dL — ABNORMAL HIGH (ref 0.61–1.24)
GFR, Estimated: 48 mL/min — ABNORMAL LOW (ref >60)
Glucose, Bld: 84 mg/dL (ref 70–99)
Potassium: 4 mmol/L (ref 3.5–5.1)
Sodium: 136 mmol/L (ref 135–145)
Total Bilirubin: 1 mg/dL (ref 0.0–1.2)
Total Protein: 8 g/dL (ref 6.5–8.1)

## 2023-06-24 LAB — CUP PACEART REMOTE DEVICE CHECK
Battery Remaining Longevity: 66 mo
Battery Remaining Percentage: 57 %
Brady Statistic RV Percent Paced: 0 %
Date Time Interrogation Session: 20250618023100
HighPow Impedance: 54 Ohm
Implantable Lead Connection Status: 753985
Implantable Lead Implant Date: 20150601
Implantable Lead Location: 753860
Implantable Lead Model: 295
Implantable Lead Serial Number: 134196
Implantable Pulse Generator Implant Date: 20150601
Lead Channel Impedance Value: 418 Ohm
Lead Channel Pacing Threshold Amplitude: 1.4 V
Lead Channel Pacing Threshold Pulse Width: 0.6 ms
Lead Channel Setting Pacing Amplitude: 3 V
Lead Channel Setting Pacing Pulse Width: 0.6 ms
Lead Channel Setting Sensing Sensitivity: 0.6 mV
Pulse Gen Serial Number: 190689
Zone Setting Status: 755011

## 2023-06-24 LAB — I-STAT CHEM 8, ED
BUN: 27 mg/dL — ABNORMAL HIGH (ref 6–20)
Calcium, Ion: 1.23 mmol/L (ref 1.15–1.40)
Chloride: 108 mmol/L (ref 98–111)
Creatinine, Ser: 1.6 mg/dL — ABNORMAL HIGH (ref 0.61–1.24)
Glucose, Bld: 90 mg/dL (ref 70–99)
HCT: 50 % (ref 39.0–52.0)
Hemoglobin: 17 g/dL (ref 13.0–17.0)
Potassium: 3.9 mmol/L (ref 3.5–5.1)
Sodium: 139 mmol/L (ref 135–145)
TCO2: 22 mmol/L (ref 22–32)

## 2023-06-24 LAB — TROPONIN I (HIGH SENSITIVITY)
Troponin I (High Sensitivity): 13 ng/L (ref <18)
Troponin I (High Sensitivity): 15 ng/L (ref <18)

## 2023-06-24 LAB — MAGNESIUM: Magnesium: 1.9 mg/dL (ref 1.7–2.4)

## 2023-06-24 LAB — ETHANOL: Alcohol, Ethyl (B): <15 mg/dL (ref <15)

## 2023-06-24 MED ORDER — ALLOPURINOL 300 MG PO TABS
150.0000 mg | ORAL_TABLET | Freq: Every day | ORAL | Status: DC
  Administered 2023-06-25: 150 mg via ORAL
  Filled 2023-06-24: qty 1

## 2023-06-24 MED ORDER — AMIODARONE HCL IN DEXTROSE 360-4.14 MG/200ML-% IV SOLN
60.0000 mg/h | INTRAVENOUS | Status: AC
  Administered 2023-06-24 (×2): 60 mg/h via INTRAVENOUS
  Filled 2023-06-24 (×2): qty 200

## 2023-06-24 MED ORDER — EPLERENONE 25 MG PO TABS
25.0000 mg | ORAL_TABLET | Freq: Every day | ORAL | Status: DC
  Administered 2023-06-25: 25 mg via ORAL
  Filled 2023-06-24: qty 1

## 2023-06-24 MED ORDER — SODIUM CHLORIDE 0.9% FLUSH: 3.0000 mL | INTRAVENOUS | Status: DC | PRN

## 2023-06-24 MED ORDER — SODIUM CHLORIDE 0.9% FLUSH
3.0000 mL | Freq: Two times a day (BID) | INTRAVENOUS | Status: DC
  Administered 2023-06-24: 3 mL via INTRAVENOUS

## 2023-06-24 MED ORDER — AMIODARONE HCL IN DEXTROSE 360-4.14 MG/200ML-% IV SOLN
30.0000 mg/h | INTRAVENOUS | Status: DC
  Administered 2023-06-24 – 2023-06-25 (×2): 30 mg/h via INTRAVENOUS
  Filled 2023-06-24: qty 200

## 2023-06-24 MED ORDER — ACETAMINOPHEN 325 MG PO TABS: 650.0000 mg | ORAL_TABLET | ORAL | Status: DC | PRN

## 2023-06-24 MED ORDER — ATORVASTATIN CALCIUM 10 MG PO TABS
20.0000 mg | ORAL_TABLET | Freq: Every day | ORAL | Status: DC
  Administered 2023-06-24 – 2023-06-25 (×2): 20 mg via ORAL
  Filled 2023-06-24 (×2): qty 2

## 2023-06-24 MED ORDER — SODIUM CHLORIDE 0.9 % IV SOLN: 250.0000 mL | INTRAVENOUS | Status: DC | PRN

## 2023-06-24 MED ORDER — ONDANSETRON HCL 4 MG/2ML IJ SOLN: 4.0000 mg | Freq: Four times a day (QID) | INTRAMUSCULAR | Status: DC | PRN

## 2023-06-24 MED ORDER — EMPAGLIFLOZIN 10 MG PO TABS
10.0000 mg | ORAL_TABLET | Freq: Every day | ORAL | Status: DC
  Administered 2023-06-25: 10 mg via ORAL
  Filled 2023-06-24: qty 1

## 2023-06-24 MED ORDER — ASPIRIN 81 MG PO TBEC
81.0000 mg | DELAYED_RELEASE_TABLET | Freq: Every day | ORAL | Status: DC
  Administered 2023-06-25: 81 mg via ORAL
  Filled 2023-06-24: qty 1

## 2023-06-24 MED ORDER — TRAZODONE HCL 50 MG PO TABS
150.0000 mg | ORAL_TABLET | Freq: Every day | ORAL | Status: DC
  Administered 2023-06-24: 150 mg via ORAL
  Filled 2023-06-24 (×2): qty 1

## 2023-06-24 MED ORDER — ENOXAPARIN SODIUM 40 MG/0.4ML IJ SOSY
40.0000 mg | PREFILLED_SYRINGE | INTRAMUSCULAR | Status: DC
  Administered 2023-06-24: 40 mg via SUBCUTANEOUS
  Filled 2023-06-24: qty 0.4

## 2023-06-24 MED ORDER — AMIODARONE LOAD VIA INFUSION
150.0000 mg | Freq: Once | INTRAVENOUS | Status: AC
Start: 2023-06-24 — End: 2023-06-24
  Administered 2023-06-24: 150 mg via INTRAVENOUS
  Filled 2023-06-24: qty 83.34

## 2023-06-24 NOTE — ED Provider Notes (Signed)
  EMERGENCY DEPARTMENT AT [facility] Provider Note   CSN: [id number] Arrival date & time: 06/24/23  1218     Patient presents with: AICD Problem, Dizziness, and Shortness of Breath   Stanley [name] is a 58 y.o. male.   58 year old male with prior medical history as detailed below presents for evaluation.  Patient with history of AICD, heart failure.  Patient is known to Dr. Julane Ny.  He presents today with report that his AICD shocked him yesterday.  Patient reports that he was taking a nap yesterday.  He received a call this morning reporting that his device delivered a shock -this reportedly occurred when he was napping.  Patient denies any symptoms related to possible AICD discharge.  He denies chest pain.  He denies recent alcohol use.  He reports full compliance with previously prescribed medications.    The history is provided by the patient and medical records.       Prior to Admission medications   Medication Sig Start Date End Date Taking? Authorizing Provider  allopurinol  (ZYLOPRIM ) 300 MG tablet TAKE 1 TABLET EVERY DAY (NEED MD APPOINTMENT) 01/09/23   Fleming, Zelda W, NP  amiodarone  (PACERONE ) 200 MG tablet Take 1 tablet (200 mg total) by mouth daily. 01/18/23   Bensimhon, Rheta Celestine, MD  aspirin  EC 81 MG tablet Take 81 mg by mouth daily. Swallow whole.    [provider]  atorvastatin  (LIPITOR) 20 MG tablet Take 1 tablet (20 mg total) by mouth daily. 03/05/23   Milford, Arlice Bene, FNP  empagliflozin  (JARDIANCE ) 10 MG TABS tablet Take 1 tablet (10 mg total) by mouth daily. 03/05/23   Elmarie Hacking, FNP  eplerenone  (INSPRA ) 25 MG tablet Take 1 tablet (25 mg total) by mouth daily. 03/31/23   Bensimhon, Rheta Celestine, MD  furosemide  (LASIX ) 40 MG tablet Take 1 tablet (40 mg total) by mouth daily. 04/29/23   Lee, [place], NP  losartan  (COZAAR ) 50 MG tablet Take 1 tablet (50 mg total) by mouth daily. 03/31/23   Bensimhon, Rheta Celestine, MD   nitroGLYCERIN  (NITROSTAT ) 0.4 MG SL tablet Place 1 tablet (0.4 mg total) under the tongue every 5 (five) minutes for 3 doses as needed for chest pain. 09/29/22   Fleming, Zelda W, NP  potassium chloride  SA (KLOR-CON  M) 20 MEQ tablet Take 1 tablet (20 mEq total) by mouth daily. 05/11/23   Lee, [place], NP  traZODone  (DESYREL ) 150 MG tablet TAKE 1 TABLET AT BEDTIME 09/17/22   Fleming, Zelda W, NP    Allergies: Lisinopril, Allopurinol , Entresto  [sacubitril -valsartan ], Other, Shellfish allergy, and Spironolactone     Review of Systems  All other systems reviewed and are negative.   Updated Vital Signs SpO2 98%   Physical Exam Vitals and nursing note reviewed.  Constitutional:      General: He is not in acute distress.    Appearance: Normal appearance. He is well-developed.  HENT:     Head: Normocephalic and atraumatic.   Eyes:     Conjunctiva/sclera: Conjunctivae normal.     Pupils: Pupils are equal, round, and reactive to light.    Cardiovascular:     Rate and Rhythm: Normal rate and regular rhythm.     Heart sounds: Normal heart sounds.  Pulmonary:     Effort: Pulmonary effort is normal. No respiratory distress.     Breath sounds: Normal breath sounds.  Abdominal:     General: There is no distension.     Palpations: Abdomen is soft.  Tenderness: There is no abdominal tenderness.   Musculoskeletal:        General: No deformity. Normal range of motion.     Cervical back: Normal range of motion and neck supple.   Skin:    General: Skin is warm and dry.   Neurological:     General: No focal deficit present.     Mental Status: He is alert and oriented to person, place, and time.     (all labs ordered are listed, but only abnormal results are displayed) Labs Reviewed  CBC WITH DIFFERENTIAL/PLATELET  COMPREHENSIVE METABOLIC PANEL WITH GFR  ETHANOL  I-STAT CHEM 8, ED  TROPONIN I (HIGH SENSITIVITY)    EKG: EKG Interpretation Date/Time:  Thursday June 24 2023 12:37:58  EDT Ventricular Rate:  71 PR Interval:  168 QRS Duration:  150 QT Interval:  508 QTC Calculation: 552 R Axis:   142  Text Interpretation: Sinus rhythm with frequent Premature ventricular complexes Right atrial enlargement Right axis deviation Non-specific intra-ventricular conduction block Abnormal ECG When compared with ECG of 12-Feb-2023 14:02, PREVIOUS ECG IS PRESENT Confirmed by Angela Kell [phone]) on 06/24/2023 12:50:14 PM  Radiology: CUP PACEART REMOTE DEVICE CHECK Result Date: 06/24/2023 ICD: Scheduled remote reviewed. Normal device function.  Presenting rhythm: VS Next remote 91 days. ML, CVRS    Procedures   Medications Ordered in the ED - No data to display                                  Medical Decision Making Amount and/or Complexity of Data Reviewed Labs: ordered. Radiology: ordered.    Medical Screen Complete  This patient presented to the ED with complaint of AICD discharge.  This complaint involves an extensive number of treatment options. The initial differential diagnosis includes, but is not limited to, AICD discharge, metabolic abnormality, etc.  This presentation is: Acute, Chronic, Self-Limited, Previously Undiagnosed, Uncertain Prognosis, Complicated, Systemic Symptoms, and Threat to Life/Bodily Function  Patient with known history of CHF, AICD.  Patient presents today after apparent AICD discharge that occurred yesterday evening.  Patient is asymptomatic on evaluation.  Cardiology is aware of case.    Oncoming ED provider is aware of case.  Final disposition dependent on cardiology plan of care  Additional history obtained: External records from outside sources obtained and reviewed including prior ED visits and prior Inpatient records.    Problem List / ED Course:  AICD discharge   Disposition:  After consideration of the diagnostic results and the patients response to treatment, I feel that the patent would benefit from completion  of ED evaluation.       Final diagnoses:  AICD discharge    ED Discharge Orders     None          Burnette Carte, MD 06/24/23 1536

## 2023-06-24 NOTE — Telephone Encounter (Signed)
 Remote received from [employer] for Antitachycardia pacing (ATP) therapy delivered to convert arrhythmia. Ventricular shock therapy delivered to convert arrhythmia. Event occurred 6/18 @ 16:56, duration 50sec, HR 226.  EGM c/w sustained VT unchanged with 1 burst of ATP, HV therapy delivered 41J, converting arrhythmia.  ______________________________________________________________________  Stanley [name] with patient regarding recent shock from ICD on 6/18. Patient states he was asleep and did not feel the shock but felt heaviness in his chest, fatigue, and a bad taste in his mouth upon wakening. Patient states he currently feels fatigue and has had trouble with fluid accumulation for about a month. Medications reviewed over the phone with compliance. Shock plan and driving restrictions for 6 months reviewed. Patient advised to go to ER given current symptoms and verbalizes understanding.

## 2023-06-24 NOTE — H&P (Signed)
 Advanced Heart Failure Team Consult Note   Primary Physician: Collins Dean, NP Cardiologist:  Jules Oar, MD Reason for Consultation: Acute on Chronic Systolic Heart Failure HPI:    Stanley [name] is seen today for evaluation of acute on chronic systolic heart failure at the request of Dr. Marven Slimmer.   Stanley [name] is a 58 y.o. male with systolic HF due to NICM d/x'd in 2006 (felt 2/2 HTN/ETOH), ? CAD s/p PCI to unknown vessel 2011 at outside location (cath 2014 without CAD and normal coronaries by CT 07/2016), VT s/p [facility] ICD (2015), noncompliance, chronic noncardiac chest pain, depression, HTN and ETOH abuse.     In 2017 he received inappropriate therapy for ST with ATP (after a work out at [employer] and reported noncompliance with meds) that degenerated into VT and received several shocks. He has had issues with chronic chest pain without evidence for ischemic etiology including normal cath and cardiac CT as above. This is relatively unchanged. Echo 03/2016  EF 35-40%, grade 1 DD, mild MR, moderate LAE   Recurrent ICD shock in 3/20 in setting of medicine noncompliance. Amio continued. Echo 3/20 EF 25-30% Normal RV.    S/p Barostim 7/21.   Admitted 6/24 with recurrent VT. 2/2 intoxication and metabolic derangement. Echo showed EF < 20%, RV mildly reduced. Required pressors and ICU monitoring due to worsening respiratory failure and ETOH withdrawal. Underwent RHC showing elevated RA pressures, normal PCWP, mild to moderate PAH and decreased output with CI (Thermo) 1.7. He was discharged home, weight 136 lbs.   Seen in ED 2/25 with a/c HF & CAP. Echo showed EF 25-30%, G1DD, normal RV, mild to moderate MR. Diuresed with IV lasix  and treated with abx. Dig, losartan , lasix  and Inspra  held with elevated SCr of 1.7  Seen in [facility] Clinic 4/25. ReDs at the time 28%, however JVP and S3 on exam. Weight was significantly up. Lasix  increased, scheduled for close follow up, however did  not show.   EF chronically low 25-30%, G1DD, nl rv, mild/mod MR.   Presented to ED 6/10 today after call from EP that his ICD fired overnight around 9 pm but patient was sleeping at the time and did not feel it. Reports that he has been normal state of health. Has been responding well to oral diuretics. Hemodynamically stable on admission. Tele with increased ectopy, frequent PVCs and intermittent ventricular bigeminy. Labs notable for K 4.0, CO2 20, BUN/Cr 24/1.63, LFTs okay, hs-trop negative. CBC normal. CXR with cardiomegaly and mild pulmonary edema. Was instructed to take additional Lasix  dose today by EP. Good UOP and color.    Home Medications Prior to Admission medications   Medication Sig Start Date End Date Taking? Authorizing Provider  allopurinol  (ZYLOPRIM ) 300 MG tablet TAKE 1 TABLET EVERY DAY (NEED MD APPOINTMENT) 01/09/23   Fleming, Zelda W, NP  amiodarone  (PACERONE ) 200 MG tablet Take 1 tablet (200 mg total) by mouth daily. 01/18/23   Bensimhon, Rheta Celestine, MD  aspirin  EC 81 MG tablet Take 81 mg by mouth daily. Swallow whole.    [provider]  atorvastatin  (LIPITOR) 20 MG tablet Take 1 tablet (20 mg total) by mouth daily. 03/05/23   Milford, Arlice Bene, FNP  empagliflozin  (JARDIANCE ) 10 MG TABS tablet Take 1 tablet (10 mg total) by mouth daily. 03/05/23   Milford, Arlice Bene, FNP  eplerenone  (INSPRA ) 25 MG tablet Take 1 tablet (25 mg total) by mouth daily. 03/31/23   Bensimhon, Rheta Celestine, MD  furosemide  (LASIX ) 40 MG tablet Take 1 tablet (40 mg total) by mouth daily. 04/29/23   Lorraine Terriquez, [place], NP  losartan  (COZAAR ) 50 MG tablet Take 1 tablet (50 mg total) by mouth daily. 03/31/23   Bensimhon, Rheta Celestine, MD  nitroGLYCERIN  (NITROSTAT ) 0.4 MG SL tablet Place 1 tablet (0.4 mg total) under the tongue every 5 (five) minutes for 3 doses as needed for chest pain. 09/29/22   Fleming, Zelda W, NP  potassium chloride  SA (KLOR-CON  M) 20 MEQ tablet Take 1 tablet (20 mEq total) by mouth daily. 05/11/23    Norine Reddington, [place], NP  traZODone  (DESYREL ) 150 MG tablet TAKE 1 TABLET AT BEDTIME 09/17/22   Collins Dean, NP   Past Medical History: Past Medical History:  Diagnosis Date   Acute renal failure (ARF) (HCC)    Acute respiratory failure with hypoxia (HCC) 12/29/2018   Alcohol abuse    Anxiety    CAD (coronary artery disease)    a. dx unclear, reported PCI in 2011 but cath 2014 normal coronaries and cardiac CT 07/2016 normal coronaries, no mention of stents.   Chronic chest pain    Chronic systolic CHF (congestive heart failure) (HCC)    Depression    Dyspnea    07/05/2019: per patient some times with exertion, has to catch breath   Family history of breast cancer 05/30/2021   Family history of pancreatic cancer 05/30/2021   Financial difficulties    Gout    Gouty arthritis    Headache    History of noncompliance with medical treatment    financial challenges   Hypertension    ICD (implantable cardioverter-defibrillator) in place    [employer] ICD implant done in [place] 2015   Insomnia    Lobar pneumonia (HCC) 12/28/2018   Myocardial infarction (HCC) 2005   Nonischemic cardiomyopathy (HCC)    Sleep apnea    per patient, has CPAP does not wear it every night   Ventricular tachycardia (HCC) 01/2015   2017 - ATP delivered for sinus tach, degenerated into VT for which he received shock. Recurrent VT 03/2018.   Past Surgical History: Past Surgical History:  Procedure Laterality Date   BAROREFLEX SYSTEM INSERTION     BIOPSY  03/06/2021   Procedure: BIOPSY;  Surgeon: Ace Holder, MD;  Location: [facility];  Service: Gastroenterology;;   CARDIAC DEFIBRILLATOR PLACEMENT  06/05/2013   [employer] Inogen ICD implanted at [facility] in [place] for primary prevention   COLONOSCOPY WITH PROPOFOL  N/A 03/06/2021   Procedure: COLONOSCOPY WITH PROPOFOL ;  Surgeon: Ace Holder, MD;  Location: [facility];  Service: Gastroenterology;  Laterality: N/A;   ESOPHAGEAL  DILATION  03/06/2021   Procedure: ESOPHAGEAL DILATION;  Surgeon: Ace Holder, MD;  Location: [facility];  Service: Gastroenterology;;   ESOPHAGOGASTRODUODENOSCOPY (EGD) WITH PROPOFOL  N/A 03/06/2021   Procedure: ESOPHAGOGASTRODUODENOSCOPY (EGD) WITH PROPOFOL ;  Surgeon: Ace Holder, MD;  Location: [facility];  Service: Gastroenterology;  Laterality: N/A;   ESOPHAGOGASTRODUODENOSCOPY (EGD) WITH PROPOFOL  N/A 05/18/2021   Procedure: ESOPHAGOGASTRODUODENOSCOPY (EGD) WITH PROPOFOL ;  Surgeon: Albertina Hugger, MD;  Location: [facility] ENDOSCOPY;  Service: Gastroenterology;  Laterality: N/A;   HERNIA REPAIR     PERCUTANEOUS CORONARY STENT INTERVENTION (PCI-S)  01/05/2009   POLYPECTOMY  03/06/2021   Procedure: POLYPECTOMY;  Surgeon: Ace Holder, MD;  Location: [facility] ENDOS[facility];  Service: Gastroenterology;;   RIGHT HEART CATH N/A 06/11/2022   Procedure: RIGHT HEART CATH;  Surgeon: Darlis Eisenmenger, MD;  Location: [facility]  INVASIVE CV LAB;  Service: Cardiovascular;  Laterality: N/A;   Family History: Family History  Problem Relation Age of Onset   Breast cancer Mother    Pancreatic cancer Mother    Hypertension Father    Alzheimer's disease Father    Gout Father    Dementia Father    Hypertension Sister    Clotting disorder Sister    Obesity Brother    Bronchitis Brother    Cancer Maternal Uncle        unknown type; dx after 42; x2 maternal uncles   Breast cancer Paternal Aunt        dx after 50   Lung cancer Paternal Uncle    Heart disease Maternal Grandmother    Gout Paternal Grandfather    Colon polyps Neg Hx    Colon cancer Neg Hx    Esophageal cancer Neg Hx    Stomach cancer Neg Hx    Social History: Social History   Socioeconomic History   Marital status: Divorced    Spouse name: Not on file   Number of children: 1   Years of education: 12   Highest education level: Not on file  Occupational History   Occupation: Disability  Tobacco Use   Smoking status:  Never   Smokeless tobacco: Never  Vaping Use   Vaping status: Never Used  Substance and Sexual Activity   Alcohol use: Not Currently    Comment: Last drink before 06/08/2022   Drug use: No   Sexual activity: Not Currently  Other Topics Concern   Not on file  Social History Narrative   Lives in [place] alone.  Disabled.  Previously worked as a Geophysicist/field seismologist.   Fun: Rest, Read and watch movies.    Social Drivers of Health   Financial Resource Strain: High Risk (06/30/2022)   Overall Financial Resource Strain (CARDIA)    Difficulty of Paying Living Expenses: Hard  Food Insecurity: Food Insecurity Present (02/11/2023)   Hunger Vital Sign    Worried About Running Out of Food in the Last Year: Sometimes true    Ran Out of Food in the Last Year: Sometimes true  Transportation Needs: Unmet Transportation Needs (02/11/2023)   PRAPARE - Administrator, Civil Service (Medical): Yes    Lack of Transportation (Non-Medical): No  Physical Activity: Unknown (03/21/2018)   Exercise Vital Sign    Days of Exercise per Week: Patient declined    Minutes of Exercise per Session: Patient declined  Stress: Stress Concern Present (03/21/2018)   [employer] of Occupational Health - Occupational Stress Questionnaire    Feeling of Stress : To some extent  Social Connections: Unknown (03/21/2018)   Social Connection and Isolation Panel    Frequency of Communication with Friends and Family: Patient declined    Frequency of Social Gatherings with Friends and Family: Patient declined    Attends Religious Services: Patient declined    Database administrator or Organizations: Patient declined    Attends Banker Meetings: Patient declined    Marital Status: Patient declined   Allergies:  Allergies  Allergen Reactions   Entresto  [Sacubitril -Valsartan ] Other (See Comments)    History of angioedema   Lisinopril Shortness Of Breath    Makes lips swell   Other Hives and  Swelling    Peanuts   Shellfish Allergy Hives and Swelling    Lip Swelling   Allopurinol  Nausea Only and Other (See Comments)    dizziness   Spironolactone  Other (  See Comments)    Breast tenderness   Objective:    Vital Signs:   Pulse Rate:  [70] 70 (06/19 1600) Resp:  [16] 16 (06/19 1600) BP: (107)/(72) 107/72 (06/19 1600) SpO2:  [98 %-100 %] 100 % (06/19 1600)   Weight change: There were no vitals filed for this visit.  Intake/Output:  No intake or output data in the 24 hours ending 06/24/23 1648   Physical Exam    General: Well appearing. No distress on RA Cardiac: JVP difficult to assess, thick neck.  Abdomen: Taut, non-tender, distended.  Extremities: Warm and dry.  No peripheral edema.  Neuro: Alert and oriented x3. Affect pleasant. Moves all extremities without difficulty.  Telemetry   SR in 60s, frequent PVCs and vent bigeminy (personally reviewed)  EKG    SR 71 bpm with 2 PVCs (personally reviewed)  Labs   Basic Metabolic Panel: Recent Labs  Lab 06/24/23 1340 06/24/23 1405  NA 136 139  K 4.0 3.9  CL 105 108  CO2 20*  --   GLUCOSE 84 90  BUN 24* 27*  CREATININE 1.63* 1.60*  CALCIUM  9.3  --    CBC: Recent Labs  Lab 06/24/23 1340 06/24/23 1405  WBC 10.3  --   NEUTROABS 7.7  --   HGB 15.1 17.0  HCT 47.0 50.0  MCV 98.9  --   PLT 223  --    BNP (last 3 results) Recent Labs    03/05/23 1244 03/12/23 1209 04/29/23 1426  BNP 154.5* 151.5* 214.9*   Medications:    Current Medications:  allopurinol   150 mg Oral Daily   [START ON 06/25/2023] aspirin  EC  81 mg Oral Daily   atorvastatin   20 mg Oral Daily   [START ON 06/25/2023] empagliflozin   10 mg Oral Daily   enoxaparin  (LOVENOX ) injection  40 mg Subcutaneous Q24H   [START ON 06/25/2023] eplerenone   25 mg Oral Daily   sodium chloride  flush  3 mL Intravenous Q12H   traZODone   150 mg Oral QHS   Infusions:  sodium chloride      amiodarone  60 mg/hr (06/24/23 1532)   Followed by    amiodarone       Patient Profile   Stanley [name] is a 58 y.o. male with systolic HF due to NICM d/x'd in 2006 (felt 2/2 HTN/ETOH), ? CAD s/p PCI to unknown vessel 2011 at outside location (cath 2014 without CAD and normal coronaries by CT 07/2016), VT s/p [facility] ICD (2015), noncompliance, chronic noncardiac chest pain, depression, HTN and ETOH abuse.   Assessment/Plan   1. VT  - H/o Shocks from [employer] ICD (6/24), 2/2 hypomag and hypokalemia, ETOH abuse, and medication noncompliance. - AICD fired overnight, patient unaware, called to ED for evaluation by EP - No other VT noted on device interrogation - EP following - Continue amio gtt   2. Chronic systolic CHF - Mostly NICM (? H/o PCI to unknown vessel in 2011.) CTA 2018 normal cors.  - s/p [employer] ICD + Barostim - EF has remained low ~20-30% since 2020, slight increase in function to 30-35% on 10/24 echo.  - CPX 9/20 moderate to severe HF limitation. pVO2: 14.9 (52% predicted peak VO2).  slope: 31  pRER: 1.05  - RHC (6/24): RA 10, PCWP 11, CO/CI (TD) 2.9/1.78. (required DBA) - Most recent echo (2/25) EF 25-30%, G1DD, normal RV, mild to moderate MR - NICM etiology uncertain. EF has remained low despite cutting back ETOH significantly and medication compliance. Will get CMR  while admitted. Sending ACE level. - At baseline. NYHA II. Slightly volume up on exam. Volume by exam always difficult. ?significant central edema vs ascities with history of ETOH. Check abd US  for ascities.  - Instructed to take additional Lasix  dose today prior to coming in. Good UOP to PO diuresis - May need one dose IV Lasix  tomorrow. - Continue jardiance  10 mg daily - Continue eplerenone  25 mg daily - Hold losartan  with soft BP - Not prev started on ? blocker, start prior to dc   3. Chronic atypical chest pain - positional and nocturnal, related to lying down - not ischemic in presentation - suspect this is related to orthopnea - no current  chest pain   4. ETOH abuse - Heavy ETOH use, likely contributes to cardiomyopathy.  - Reports he has cut way back - Abd US  ordered to r/o ascities   5. CKD 3a: Baseline SCr 1.3-1.6   6. HTN - not currently HTN. Hold losartan .    7. CAD - h/o single vessel PCI - Myoview  (5/22): EF 34% fixed inferior defect - Continue ASA + atorvastatin  20 mg daily   8. OSA: Unable to tolerate CPAP  Length of Stay: 0  Stanley Leani Myron, NP  06/24/2023, 4:48 PM  Advanced Heart Failure Team Pager [phone] (M-F; 7a - 5p)  Please contact [facility] for night-coverage after hours (4p -7a ) and weekends on [email or web address]

## 2023-06-24 NOTE — ED Provider Notes (Signed)
 Cardiology to admit the patient.  Hemodynamically stable throughout my care.   Lowery Rue, DO 06/24/23 1535

## 2023-06-24 NOTE — Consult Note (Addendum)
 ELECTROPHYSIOLOGY CONSULT NOTE    Patient ID: Stanley [name] MRN: [id number], DOB/AGE: 05-08-1965 58 y.o.  Admit date: 06/24/2023 Date of Consult: 06/24/2023  Primary Physician: Collins Dean, NP Primary Cardiologist: Jules Oar, MD  Electrophysiologist: Dr. Nunzio Belch -> New to Dr. Lawana Pray  Referring Provider: Dr. Reba Camper  Patient Profile: Stanley [name] is a 58 y.o. 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 Reba Camper.  HPI:  Stanley [name] is a 58 y.o. male with medical history as above.   Last seen in HF clinic 04/29/2023. Reported compliance with his meds but noted to be significantly volume overloaded. Furosemide  increased.  Has had some financial difficulties with his meds.   Missed EP visit last week.   Device clinic received alert this am for ATP + ICD Shock for VT. Pt reported worsening SOB and fatigue as well as fluid accumulation for the past month, was recommended to proceed to ED for evaluation.   Currently he is feeling Ok. Felt funny after his nap, but had previously felt all shocks. Wasn't aware he was shocked this time until alert came through. Reports he has had gradual SOB and volume retention over the past month or so. He denies edema in his leg, but abdomen is distended.  Had recently increased his lasix  to BID.   Labs Potassium3.9 (06/19 1405)   Creatinine, ser  1.60* (06/19 1405) PLT  223 (06/19 1340) HGB  17.0 (06/19 1405) WBC 10.3 (06/19 1340) Troponin I (High Sensitivity)13 (06/19 1340).    Past Medical History:  Diagnosis Date   Acute renal failure (ARF) (HCC)    Acute respiratory failure with hypoxia (HCC) 12/29/2018   Alcohol abuse    Anxiety    CAD (coronary artery disease)    a. dx unclear, reported PCI in 2011 but cath 2014 normal coronaries and cardiac CT 07/2016 normal  coronaries, no mention of stents.   Chronic chest pain    Chronic systolic CHF (congestive heart failure) (HCC)    Depression    Dyspnea    07/05/2019: per patient some times with exertion, has to catch breath   Family history of breast cancer 05/30/2021   Family history of pancreatic cancer 05/30/2021   Financial difficulties    Gout    Gouty arthritis    Headache    History of noncompliance with medical treatment    financial challenges   Hypertension    ICD (implantable cardioverter-defibrillator) in place    [employer] ICD implant done in [place] 2015   Insomnia    Lobar pneumonia (HCC) 12/28/2018   Myocardial infarction (HCC) 2005   Nonischemic cardiomyopathy (HCC)    Sleep apnea    per patient, has CPAP does not wear it every night   Ventricular tachycardia (HCC) 01/2015   2017 - ATP delivered for sinus tach, degenerated into VT for which he received shock. Recurrent VT 03/2018.     Surgical History:  Past Surgical History:  Procedure Laterality Date   BAROREFLEX SYSTEM INSERTION     BIOPSY  03/06/2021   Procedure: BIOPSY;  Surgeon: Ace Holder, MD;  Location: [facility];  Service: Gastroenterology;;   CARDIAC DEFIBRILLATOR PLACEMENT  06/05/2013   [employer] Inogen ICD implanted at [facility] in [place] for primary prevention   COLONOSCOPY WITH PROPOFOL  N/A 03/06/2021   Procedure:  COLONOSCOPY WITH PROPOFOL ;  Surgeon: Ace Holder, MD;  Location: [facility];  Service: Gastroenterology;  Laterality: N/A;   ESOPHAGEAL DILATION  03/06/2021   Procedure: ESOPHAGEAL DILATION;  Surgeon: Ace Holder, MD;  Location: [facility];  Service: Gastroenterology;;   ESOPHAGOGASTRODUODENOSCOPY (EGD) WITH PROPOFOL  N/A 03/06/2021   Procedure: ESOPHAGOGASTRODUODENOSCOPY (EGD) WITH PROPOFOL ;  Surgeon: Ace Holder, MD;  Location: [facility];  Service: Gastroenterology;  Laterality: N/A;   ESOPHAGOGASTRODUODENOSCOPY (EGD) WITH PROPOFOL  N/A  05/18/2021   Procedure: ESOPHAGOGASTRODUODENOSCOPY (EGD) WITH PROPOFOL ;  Surgeon: Albertina Hugger, MD;  Location: [facility] ENDOSCOPY;  Service: Gastroenterology;  Laterality: N/A;   HERNIA REPAIR     PERCUTANEOUS CORONARY STENT INTERVENTION (PCI-S)  01/05/2009   POLYPECTOMY  03/06/2021   Procedure: POLYPECTOMY;  Surgeon: Ace Holder, MD;  Location: [facility];  Service: Gastroenterology;;   RIGHT HEART CATH N/A 06/11/2022   Procedure: RIGHT HEART CATH;  Surgeon: Darlis Eisenmenger, MD;  Location: [facility] INVASIVE CV LAB;  Service: Cardiovascular;  Laterality: N/A;     (Not in a hospital admission)   Inpatient Medications:     Allergies:  Allergies  Allergen Reactions   Lisinopril Shortness Of Breath    Makes lips swell   Allopurinol  Nausea Only    dizziness   Entresto  [Sacubitril -Valsartan ] Other (See Comments)    History of angioedema   Other Swelling    Nuts - Lip Swelling   Shellfish Allergy Swelling    Lip Swelling   Spironolactone  Other (See Comments)    Breast tenderness    Family History  Problem Relation Age of Onset   Breast cancer Mother    Pancreatic cancer Mother    Hypertension Father    Alzheimer's disease Father    Gout Father    Dementia Father    Hypertension Sister    Clotting disorder Sister    Obesity Brother    Bronchitis Brother    Cancer Maternal Uncle        unknown type; dx after 80; x2 maternal uncles   Breast cancer Paternal Aunt        dx after 50   Lung cancer Paternal Uncle    Heart disease Maternal Grandmother    Gout Paternal Grandfather    Colon polyps Neg Hx    Colon cancer Neg Hx    Esophageal cancer Neg Hx    Stomach cancer Neg Hx      Physical Exam: Vitals:   06/24/23 1223  SpO2: 98%    GEN- NAD, A&O x 3, normal affect HEENT: Normocephalic, atraumatic. JVP to jaw with + hepatojugular reflex  Lungs- CTAB, Normal effort.  Heart- Regular rate and rhythm, No M/G/R.  GI- Distended. .  Extremities- No clubbing,  cyanosis, or edema   Radiology/Studies: DG Chest Port 1 View Result Date: 06/24/2023 CLINICAL DATA:  cp and dyspnea EXAM: PORTABLE CHEST - 1 VIEW COMPARISON:  February 09, 2023 FINDINGS: Left chest pacemaker/AICD with a single lead terminating in the right ventricle. Right chest wall pulse generator device with a single lead extending outside the field of view in the right neck. No focal airspace consolidation, pleural effusion, or pneumothorax. Moderate cardiomegaly. No acute fracture or destructive lesions. Multilevel thoracic osteophytosis. IMPRESSION: Cardiomegaly.  No acute cardiopulmonary abnormality. Electronically Signed   By: Rance Burrows M.D.   On: 06/24/2023 14:14   CUP PACEART REMOTE DEVICE CHECK Result Date: 06/24/2023 ICD: Scheduled remote reviewed. Normal device function.  Presenting rhythm: VS Next remote 91 days. ML, CVRS  EKG:on arrival shows NSR at 71 bpm with barostim artifact. (personally reviewed)    TELEMETRY: NS 60-70s with frequent PVCs (personally reviewed)  DEVICE HISTORY:  Barostim (standard) implanted 07/07/2019 for Chronic systolic CHF  [employer] single chamber ICD 06/05/2013 for chronic systolic CHF   Assessment/Plan:  VT Frequent PVCs With appropriate therapy NCDMV restrictions reviewed Tahiry Spicer reload amiodarone  IV while diuresing.  Off BB with acute HF and S3 previously on exam.  Potassium3.9 (06/19 1405)   Creatinine, ser  1.60* (06/19 1405) Normal device interrogation. No additional arrhythmia/  Keep K > 4.0 and Mg > 2.0   A/C systolic CHF NICM LBBB Echo 02/2023 howed LVEF 25-30% HF team to see Previously qualified for Barostim with IVCD, but has had gradual increase in his LBBB. He may benefit from CRT upgrade. Nessie Nong update EKG with magnet on barostim.   Med compliance Reports med compliance except for the one with the long name  ETOH abuse States that he has occasional wine on the weekend but has been avoiding hard liquor.   Alcohol, Ethyl <15  Dr. Lawana Pray has seen. Re-load with IV amiodarone  while diuresing and potentially discuss CRT upgrade to occur as an outpatient if EKG qualifies.   For questions or updates, please contact [facility] Please consult [email or web address] for contact info under     Signed, Tylene Galla, PA-C  06/24/2023, 3:49 PM     I have seen and examined this patient with Michaelle Adolphus.  Agree with above, note added to reflect my findings.  Patient with a past medical history as above.  His medications have been adjusted by heart failure clinic, though he has had trouble in the past affording medications.  He presents to the emergency room today after an ICD shock.  He took a nap and woke up feeling funny after the nap.  He did not feel his ICD shock.  He has had some gradual shortness of breath and volume retention over the last month.  He feels that his abdomen is distended.  He has recently increased his Lasix  to twice daily.  GEN: No acute distress.   Neck: JVD to the angle of the jaw Cardiac: RRR, no murmurs, rubs, or gallops.  Respiratory: normal BS bases bilaterally. GI: Distended MS: No edema; No deformity. Neuro:  Nonfocal  Skin: warm and dry, device site well healed Psych: Normal affect    VT with PVCs: Currently on p.o. amiodarone .  Myliyah Rebuck switch to IV amiodarone .  He is volume overloaded, and arrhythmias could be related to volume status.  Makailey Hodgkin continue IV amiodarone  for now.  As volume status improves, potentially switch back to p.o. amiodarone . Acute systolic heart failure: Due to nonischemic cardiomyopathy.  He has a left bundle branch block.  He may benefit from device upgrade in the future.  For now, heart failure therapy per heart failure cardiology.  Melayah Skorupski M. Lajean Boese MD 06/24/2023 4:34 PM

## 2023-06-24 NOTE — ED Triage Notes (Signed)
 Pt bib from home with ems c.o sob/dizziness since this morning. Pt was called by his cardiologist office saying his defib fired at 9pm last night but pt denies feeling it.

## 2023-06-25 ENCOUNTER — Ambulatory Visit: Payer: Self-pay | Admitting: Cardiology

## 2023-06-25 ENCOUNTER — Other Ambulatory Visit (HOSPITAL_COMMUNITY): Payer: Self-pay

## 2023-06-25 DIAGNOSIS — I5022 Chronic systolic (congestive) heart failure: Secondary | ICD-10-CM

## 2023-06-25 DIAGNOSIS — I472 Ventricular tachycardia, unspecified: Secondary | ICD-10-CM

## 2023-06-25 LAB — BASIC METABOLIC PANEL WITH GFR
Anion gap: 9 (ref 5–15)
BUN: 30 mg/dL — ABNORMAL HIGH (ref 6–20)
CO2: 20 mmol/L — ABNORMAL LOW (ref 22–32)
Calcium: 9.2 mg/dL (ref 8.9–10.3)
Chloride: 107 mmol/L (ref 98–111)
Creatinine, Ser: 1.78 mg/dL — ABNORMAL HIGH (ref 0.61–1.24)
GFR, Estimated: 43 mL/min — ABNORMAL LOW (ref >60)
Glucose, Bld: 94 mg/dL (ref 70–99)
Potassium: 3.5 mmol/L (ref 3.5–5.1)
Sodium: 136 mmol/L (ref 135–145)

## 2023-06-25 MED ORDER — MAGNESIUM SULFATE 2 GM/50ML IV SOLN
2.0000 g | Freq: Once | INTRAVENOUS | Status: AC
  Administered 2023-06-25: 2 g via INTRAVENOUS
  Filled 2023-06-25: qty 50

## 2023-06-25 MED ORDER — ATORVASTATIN CALCIUM 20 MG PO TABS
20.0000 mg | ORAL_TABLET | Freq: Every day | ORAL | 6 refills | Status: DC
  Filled 2023-06-25: qty 30, 30d supply, fill #0

## 2023-06-25 MED ORDER — POTASSIUM CHLORIDE CRYS ER 20 MEQ PO TBCR
60.0000 meq | EXTENDED_RELEASE_TABLET | Freq: Once | ORAL | Status: AC
  Administered 2023-06-25: 60 meq via ORAL
  Filled 2023-06-25: qty 3

## 2023-06-25 MED ORDER — AMIODARONE HCL 200 MG PO TABS
ORAL_TABLET | ORAL | 5 refills | Status: DC
  Filled 2023-06-25: qty 60, 30d supply, fill #0

## 2023-06-25 NOTE — Plan of Care (Signed)

## 2023-06-25 NOTE — Progress Notes (Signed)
Heart Failure Navigator Progress Note  Assessed for Heart & Vascular TOC clinic readiness.  Patient does not meet criteria due to Advanced Heart Failure Team consult with Dr. [name].   Navigator will sign off at this time.   Earnestine Leys, BSN, Clinical cytogeneticist Only

## 2023-06-25 NOTE — Progress Notes (Deleted)
 Advanced Heart Failure Rounding Note  Cardiologist: Jules Oar, MD   Chief Complaint: VT  Subjective:    No recurrent VT overnight. SR on tele with frequent PVCs in bigeminy pattern.  No dyspnea at rest. Gets winded easily with exertion but feels he is near baseline.   Objective:   Weight Range: 84.8 kg Body mass index is 31.13 kg/m.   Vital Signs:   Temp:  [97.9 F (36.6 C)-98.6 F (37 C)] 97.9 F (36.6 C) (06/20 0818) Pulse Rate:  [54-76] 67 (06/20 0818) Resp:  [16-22] 18 (06/20 0818) BP: (94-132)/(45-85) 110/85 (06/20 0818) SpO2:  [94 %-100 %] 99 % (06/20 0818) Weight:  [84.8 kg-85.3 kg] 84.8 kg (06/20 0500) Last BM Date : 06/23/23  Weight change: Filed Weights   06/24/23 2119 06/25/23 0500  Weight: 85.3 kg 84.8 kg    Intake/Output:   Intake/Output Summary (Last 24 hours) at 06/25/2023 1004 Last data filed at 06/25/2023 0600 Gross per 24 hour  Intake --  Output 400 ml  Net -400 ml      Physical Exam    General:  Well appearing.  Neck: no JVD Cor: Regular rate & rhythm. No rubs, gallops or murmurs. Lungs: Clear Abdomen: Soft, nontender, nondistended.  Extremities: No edema Neuro: Alert & orientedx3. Affect pleasant   Telemetry   SR 60s, ~10-15 PVCs/min    Labs    CBC Recent Labs    06/24/23 1340 06/24/23 1405  WBC 10.3  --   NEUTROABS 7.7  --   HGB 15.1 17.0  HCT 47.0 50.0  MCV 98.9  --   PLT 223  --    Basic Metabolic Panel Recent Labs    54/09/81 1340 06/24/23 1405 06/25/23 0414  NA 136 139 136  K 4.0 3.9 3.5  CL 105 108 107  CO2 20*  --  20*  GLUCOSE 84 90 94  BUN 24* 27* 30*  CREATININE 1.63* 1.60* 1.78*  CALCIUM  9.3  --  9.2  MG 1.9  --   --    Liver Function Tests Recent Labs    06/24/23 1340  AST 41  ALT 54*  ALKPHOS 52  BILITOT 1.0  PROT 8.0  ALBUMIN 3.9   No results for input(s): LIPASE, AMYLASE in the last 72 hours. Cardiac Enzymes No results for input(s): CKTOTAL, CKMB,  CKMBINDEX, TROPONINI in the last 72 hours.  BNP: BNP (last 3 results) Recent Labs    03/12/23 1209 04/29/23 1426 06/24/23 1340  BNP 151.5* 214.9* 336.8*    ProBNP (last 3 results) No results for input(s): PROBNP in the last 8760 hours.   D-Dimer No results for input(s): DDIMER in the last 72 hours. Hemoglobin A1C No results for input(s): HGBA1C in the last 72 hours. Fasting Lipid Panel No results for input(s): CHOL, HDL, LDLCALC, TRIG, CHOLHDL, LDLDIRECT in the last 72 hours. Thyroid  Function Tests No results for input(s): TSH, T4TOTAL, T3FREE, THYROIDAB in the last 72 hours.  Invalid input(s): FREET3  Other results:   Imaging    US  Abdomen Limited Result Date: 06/24/2023 EXAM: Right Upper Quadrant Abdominal Ultrasound 06/24/2023 06:28:41 PM TECHNIQUE: Real-time ultrasonography of the right upper quadrant of the abdomen was performed. COMPARISON: 06/08/2022 CLINICAL HISTORY: Heart failure (HCC) FINDINGS: LIVER: The liver demonstrates normal echogenicity. No intrahepatic biliary ductal dilatation. No evidence of mass. BILIARY SYSTEM: Layering small gallstones measuring up to 10 mm. No pericholecystic fluid or wall thickening. Negative sonographic Murphy's sign. Common bile duct is within normal limits measuring  2 mm. OTHER: No right upper quadrant ascites. IMPRESSION: 1. Cholelithiasis, without associated inflammatory changes to suggest acute cholecystitis. Electronically signed by: Zadie Herter MD 06/24/2023 07:04 PM EDT RP Workstation: [id number]   DG Chest Port 1 View Result Date: 06/24/2023 CLINICAL DATA:  cp and dyspnea EXAM: PORTABLE CHEST - 1 VIEW COMPARISON:  February 09, 2023 FINDINGS: Left chest pacemaker/AICD with a single lead terminating in the right ventricle. Right chest wall pulse generator device with a single lead extending outside the field of view in the right neck. No focal airspace consolidation, pleural effusion, or  pneumothorax. Moderate cardiomegaly. No acute fracture or destructive lesions. Multilevel thoracic osteophytosis. IMPRESSION: Cardiomegaly.  No acute cardiopulmonary abnormality. Electronically Signed   By: Rance Burrows M.D.   On: 06/24/2023 14:14     Medications:     Scheduled Medications:  allopurinol   150 mg Oral Daily   aspirin  EC  81 mg Oral Daily   atorvastatin   20 mg Oral Daily   empagliflozin   10 mg Oral Daily   enoxaparin  (LOVENOX ) injection  40 mg Subcutaneous Q24H   eplerenone   25 mg Oral Daily   sodium chloride  flush  3 mL Intravenous Q12H   traZODone   150 mg Oral QHS    Infusions:  sodium chloride      amiodarone  30 mg/hr (06/25/23 0557)    PRN Medications: sodium chloride , acetaminophen , ondansetron  (ZOFRAN ) IV, sodium chloride  flush    Patient Profile   Stanley [name] is a 58 y.o. male with systolic HF due to NICM d/x'd in 2006 (felt 2/2 HTN/ETOH), ? CAD s/p PCI to unknown vessel 2011 at outside location (cath 2014 without CAD and normal coronaries by CT 07/2016), VT s/p [facility] ICD (2015), noncompliance, chronic noncardiac chest pain, depression, HTN and ETOH abuse.   Patient admitted after he received a shock from his device for VT.  Assessment/Plan   1. VT  - H/o Shocks from [employer] ICD (6/24), 2/2 hypomag and hypokalemia, ETOH abuse, and medication noncompliance. - Admitted 06/19 after received ATP + shock for VT - No other VT noted on device interrogation - EP following - Continue IV amiodarone  load per EP - CMRI today - ECG with LBBB and QRS > 150 ms, may benefit from device upgrade in the future   2. Chronic systolic CHF - Mostly NICM (? H/o PCI to unknown vessel in 2011.) CTA 2018 normal cors.  - s/p [employer] ICD + Barostim - EF has remained low ~20-30% since 2020, slight increase in function to 30-35% on 10/24 echo.  - CPX 9/20 moderate to severe HF limitation. pVO2: 14.9 (52% predicted peak VO2).  slope: 31  pRER: 1.05  - RHC  (6/24): RA 10, PCWP 11, CO/CI (TD) 2.9/1.78. (required DBA) - Most recent echo (2/25) EF 25-30%, G1DD, normal RV, mild to moderate MR - NICM etiology uncertain. EF has remained low despite cutting back ETOH significantly and medication compliance. Will get CMR while admitted. ACE level and myeloma labs pending - NYHA III symptoms. Volume looks okay today. No need for diuretic. ? Progression in HF. May need outpatient CPX to better assess functional limitation. May need advanced therapies in the future. QRS wider on ECG, may also consider CRT-D upgrade as above. - Continue jardiance  10 mg daily - Continue eplerenone  25 mg daily - Hold losartan  with soft BP - Will hold on beta blocker for now    3. Chronic atypical chest pain - currently denies   4. ETOH abuse - Heavy ETOH  use, likely contributes to cardiomyopathy.  - Reports he has cut way back - Abd US  w/ no evidence of ascites   5. CKD 3a: Baseline SCr 1.3-1.6 - Up slightly today to 1.8 after diuresis and soft blood pressures   6. HTN - BP soft. Hold losartan .    7. CAD - h/o single vessel PCI - Myoview  (5/22): EF 34% fixed inferior defect - Continue ASA + atorvastatin  20 mg daily   8. OSA: Unable to tolerate CPAP  Length of Stay: 1  Wilmon Conover N, PA-C  06/25/2023, 10:04 AM  Advanced Heart Failure Team Pager [phone] (M-F; 7a - 5p)  Please contact [facility] for night-coverage after hours (5p -7a ) and weekends on [email or web address]

## 2023-06-25 NOTE — Progress Notes (Addendum)
 Patient Name: Stanley [name] Date of Encounter: 06/25/2023  Primary Cardiologist: Jules Oar, MD Electrophysiologist: Lei Pump, MD  Interval Summary   The patient is doing well today.  At this time, the patient denies chest pain, shortness of breath, or any new concerns.  No further VT.  Vital Signs    Vitals:   06/24/23 2332 06/25/23 0403 06/25/23 0500 06/25/23 0818  BP: (!) 112/56 107/64  110/85  Pulse: 67 (!) 54  67  Resp: 19 19  18   Temp: 98.6 F (37 C) 98.4 F (36.9 C)  97.9 F (36.6 C)  TempSrc: Oral Oral  Oral  SpO2: 98% 96%  99%  Weight:   84.8 kg   Height:        Intake/Output Summary (Last 24 hours) at 06/25/2023 0835 Last data filed at 06/25/2023 0600 Gross per 24 hour  Intake --  Output 400 ml  Net -400 ml   Filed Weights   06/24/23 2119 06/25/23 0500  Weight: 85.3 kg 84.8 kg    Physical Exam    GEN- The patient is well appearing, alert and oriented x 3 today.   Lungs- Clear to ausculation bilaterally, normal work of breathing Cardiac- Regular rate and rhythm, no murmurs, rubs or gallops GI- soft, NT, ND, + BS Extremities- no clubbing or cyanosis. No edema  Telemetry    NSR 60-70s, frequent PVCs (personally reviewed)  Hospital Course    Santos Sollenberger is a 58 y.o. male with a history of HF due to NICM, ETOH abuse, h/o VT, h/o non-compliance, atypical chest pain, depression, and HTN admitted for VT/ICD shock and A/C systolic CHF.   Assessment & Plan    VT Frequent PVCs Continue IV amiodarone  at least today while diuresing, would plan on continued oral load at discharge Plan cMRI while admitted to r/o sarcoid given long h/o NICM and worsening LBBB EKG today does show LBBB > 150 ms, though does have initial R wave. May benefit from device upgrade as an outpatient. Potassium3.5 (06/20 0414) - Ordered for 60 meq Magnesium   1.9 (06/19 1340) - Supp with 2g Creatinine, ser  1.78* (06/20 0414) Keep K > 4.0 and Mg > 2.0    A/C chronic systolic CHF No peripheral edema, felt to be only mildly volume up.  GDMT per HF team.  Consider BB  Losartan  held with soft BP  CAD Diagnosis unclear, reported PCI in 2011 but cath 2014 normal coronaries and cardiac CT 07/2016 normal coronaries, no mention of stents.     For questions or updates, please contact [facility] Please consult [email or web address] for contact info under     Signed, Tylene Galla, PA-C  06/25/2023, 8:35 AM    I have seen and examined this patient with Michaelle Adolphus.  Agree with above, note added to reflect my findings.  He remains in sinus rhythm.  Had no further VT.  GEN: No acute distress.   Neck: No JVD Cardiac: RRR, no murmurs, rubs, or gallops.  Respiratory: normal BS bases bilaterally. GI: Soft, nontender, non-distended  MS: No edema; No deformity. Neuro:  Nonfocal  Skin: warm and dry, device site well healed Psych: Normal affect    Ventricular tachycardia: Fortunately no further episodes.  Ashwika Freels continue IV and rhythm for now.  Upon discharge, Ozell Juhasz transition to 400 mg twice daily for 1 week followed by 200 mg daily.  Khalaya Mcgurn arrange for follow-up in EP clinic. Acute on chronic systolic heart failure: No peripheral edema.  Medical therapy for  heart failure.  Plans for cardiac MRI.  He does have a left bundle branch block.  He would likely benefit from a device upgrade as an outpatient. Coronary artery disease: No current chest pain or ischemic symptoms  Avishai Reihl M. Arvella Massingale MD 06/25/2023 9:08 AM

## 2023-06-25 NOTE — TOC Initial Note (Addendum)
 Transition of Care [facility]) - Initial/Assessment Note    Patient Details  Name: Stanley [name] MRN: [id number] Date of Birth: 07-21-1965  Transition of Care [facility]) CM/SW Contact:    Ernst Heap Phone Number: [phone] 06/25/2023, 11:38 AM  Clinical Narrative:   HF CSW met with patient at bedside. Patient stated that he lives alone. Patient  stated that he has a history of HH services, but could not remember agency name. Patient used to have therapy services setup. Patient stated that in the past he used cane and walker. Patient stated that he has a scale at home. Patient stated that he still drives. Patient stated that he does not work. Patient stated that he has a PCP. CSW explained that a hospital follow will be scheduled closer towards dc.   Hospital follow up appointment with PCP schedueld for Monday, July 26, 2023 at 3:10 PM.   TOC will continue following.                 Expected Discharge Plan: Home/Self Care Barriers to Discharge: Continued Medical Work up   Patient Goals and CMS Choice            Expected Discharge Plan and Services       Living arrangements for the past 2 months: Apartment                                      Prior Living Arrangements/Services Living arrangements for the past 2 months: Apartment Lives with:: Self Patient language and need for interpreter reviewed:: Yes Do you feel safe going back to the place where you live?: Yes      Need for Family Participation in Patient Care: No (Comment) Care giver support system in place?: No (comment)   Criminal Activity/Legal Involvement Pertinent to Current Situation/Hospitalization: No - Comment as needed  Activities of Daily Living   ADL Screening (condition at time of admission) Independently performs ADLs?: Yes (appropriate for developmental age) Is the patient deaf or have difficulty hearing?: No Does the patient have difficulty seeing, even when wearing  glasses/contacts?: No Does the patient have difficulty concentrating, remembering, or making decisions?: No  Permission Sought/Granted                  Emotional Assessment Appearance:: Appears stated age Attitude/Demeanor/Rapport: Engaged Affect (typically observed): Appropriate Orientation: : Oriented to Self, Oriented to Place, Oriented to  Time, Oriented to Situation Alcohol / Substance Use: Not Applicable Psych Involvement: No (comment)  Admission diagnosis:  Ventricular tachycardia (HCC) [I47.20] AICD discharge [Z45.02] Patient Active Problem List   Diagnosis Date Noted   Acute exacerbation of CHF (congestive heart failure) (HCC) 02/10/2023   Neutrophilic leukocytosis 07/31/2022   Alcoholic intoxication without complication (HCC) 06/11/2022   Cardiogenic shock (HCC) 06/10/2022   Acute hypoxemic respiratory failure (HCC) 06/09/2022   Acute on chronic combined systolic and diastolic CHF (congestive heart failure) (HCC) 06/09/2022   AICD discharge 06/08/2022   Abdominal pain 06/08/2022   Anxiety and depression 06/08/2022   Genetic testing 06/13/2021   Family history of breast cancer 05/30/2021   Family history of pancreatic cancer 05/30/2021   Diverticulosis of colon without hemorrhage    Hematemesis 05/18/2021   ABLA (acute blood loss anemia) 05/18/2021   Benign neoplasm of colon    Abdominal pain, epigastric    Early satiety    Dysphagia    Gastroesophageal reflux disease  04/23/2020   Colon cancer screening 04/23/2020   CAD (coronary artery disease) 07/07/2019   Congestive heart failure (CHF) (HCC) 07/07/2019   Moderate episode of recurrent major depressive disorder (HCC) 01/11/2019   Chronic systolic heart failure (HCC)    Obstructive sleep apnea 08/11/2018   Implantable cardioverter-defibrillator (ICD) in situ 03/21/2018   ETOH abuse 03/21/2018   AKI (acute kidney injury) (HCC) 03/21/2018   History of noncompliance with medical treatment, presenting hazards  to health 03/21/2018   Ventricular tachycardia (HCC) 03/21/2018   Gout 09/25/2015   Nonischemic cardiomyopathy (HCC) 07/03/2015   Chronic systolic dysfunction of left ventricle 07/03/2015   Essential hypertension 07/03/2015   PCP:  Collins Dean, NP Pharmacy:   [facility] - [place], [place] - [address] [place] [address] Phone: [phone] Fax: [phone]  [facility] [address] [place] [address] Phone: [phone] Fax: [phone]  Melode[facility] Spruce [facility] [address] [place] [address] Phone: [phone] Fax: [phone]     Social Drivers of Health (SDOH) Social History: SDOH Screenings   Food Insecurity: No Food Insecurity (06/24/2023)  Housing: Unknown (06/24/2023)  Transportation Needs: No Transportation Needs (06/24/2023)  Utilities: Not At Risk (06/24/2023)  Alcohol Screen: Low Risk  (02/07/2019)  Depression (PHQ2-9): Medium Risk (02/16/2023)  Financial Resource Strain: High Risk (06/30/2022)  Physical Activity: Unknown (03/21/2018)  Social Connections: Patient Declined (06/24/2023)  Stress: Stress Concern Present (03/21/2018)  Tobacco Use: Low Risk  (06/24/2023)   SDOH Interventions:     Readmission Risk Interventions    06/16/2022   11:13 AM 06/11/2022   11:32 AM  Readmission Risk Prevention Plan  Transportation Screening  Complete  PCP or Specialist Appt within 5-7 Days Complete   Home Care Screening Complete   Medication Review (RN CM) Complete   HRI or Home Care Consult  Complete  Social Work Consult for Recovery Care Planning/Counseling  Complete  Palliative Care Screening  Not Applicable  Medication Review Oceanographer)  Referral to Pharmacy

## 2023-06-25 NOTE — Progress Notes (Signed)
 Pt ordered for cMRI  Barostim IS MRI safe as below.   [employer] device is ALSO MRI safe.

## 2023-06-25 NOTE — Progress Notes (Signed)
 Order placed for Cardiac PET Sarcoid to r/o sarcoidosis per Gerilyn Kobus PA-C.  This will be done outpatient- will await pre cert/scheduling.

## 2023-06-25 NOTE — Discharge Summary (Addendum)
 Advanced Heart Failure Team  Discharge Summary   Patient ID: Stanley [name] MRN: [id number], DOB/AGE: 12-Sep-1965 58 y.o. Admit date: 06/24/2023 D/C date:     06/25/2023   Primary Discharge Diagnoses:  VT w/ ICD shock Chronic systolic HF NICM  Hospital Course:   Stanley [name] is a 58 y.o. male with systolic HF due to NICM d/x'd in 2006 (felt 2/2 HTN/ETOH), ? CAD s/p PCI to unknown vessel 2011 at outside location (cath 2014 without CAD and normal coronaries by CT 07/2016), VT s/p [facility] ICD (2015), noncompliance, chronic noncardiac chest pain, depression, HTN and ETOH abuse.     In 2017 he received inappropriate therapy for ST with ATP (after a work out at [employer] and reported noncompliance with meds) that degenerated into VT and received several shocks.    Recurrent ICD shock in 3/20 in setting of medicine noncompliance. Amio continued. Echo 3/20 EF 25-30% Normal RV.    S/p Barostim 7/21.   Admitted 6/24 with recurrent VT 2/2 intoxication and metabolic derangement. Echo showed EF < 20%, RV mildly reduced. Required pressors and ICU monitoring due to worsening respiratory failure and ETOH withdrawal.  RHC showed elevated RA pressures, normal PCWP, mild to moderate PAH and decreased output with CI (Thermo) 1.7.    Admitted 2/25 with a/c HF & CAP. Echo showed EF 25-30%, G1DD, normal RV, mild to moderate MR.    He had VT and received ATP followed ICD shock on 06/19. Patient was called by EP and sent to the ED for admission. He appeared compensated from HF standpoint. He was reloaded with IV amiodarone  then converted to po load. 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 cMRI attempted but not compatible with Barostim device. Plan for cardiac PET as outpatient. D/t LBBB and wide QRS > 150 ms, EP will consider CRT-D upgrade at a later date.  Close f/u in HF clinic and with EP arranged.  See below for hospital course by  problem.  Hospital Course by Problem: 1. VT  - H/o Shocks from [employer] ICD (6/24), 2/2 hypomag and hypokalemia, ETOH abuse, and medication noncompliance. - VT s/p ATP/device shock on 06/24/23. No other VT noted on device interrogation - Loaded with IV amiodarone  then converted to po amiodarone  400 BID X 1 week, 400 mg daily X 1 week, then 200 mg daily   2. Chronic systolic CHF - Mostly NICM (? H/o PCI to unknown vessel in 2011.) CTA 2018 normal cors.  - s/p [employer] ICD + Barostim - EF has remained low ~20-30% since 2020, slight increase in function to 30-35% on 10/24 echo.  - CPX 9/20 moderate to severe HF limitation. pVO2: 14.9 (52% predicted peak VO2).  slope: 31  pRER: 1.05  - RHC (6/24): RA 10, PCWP 11, CO/CI (TD) 2.9/1.78. (required DBA) - Most recent echo (2/25) EF 25-30%, G1DD, normal RV, mild to moderate MR - Etiology of NICM uncertain. EF has remained low despite cutting back ETOH significantly and medication compliance. ACE level and myeloma labs pending. - Unable to complete cMRI d/t barostim. Will arrange cardiac PET as outpatient - NYHA III. Notes increased fatigue and dyspnea with exertion. Volume okay. Resume prior home dose of lasix  at discharge. Will likely need CPX as outpatient. EP considering upgrading device to CRT-D d/t wide QRS. May eventually need to consider advanced therapies. - Continue jardiance  10 mg daily - Continue eplerenone  25 mg daily - Hold losartan  with soft BP - can  consider restarting at follow-up. - Hold off on beta blocker for now   3. Chronic atypical chest pain - not ischemic in presentation - no current chest pain   4. ETOH abuse - Heavy ETOH use, likely contributes to cardiomyopathy.  - Reports he has cut way back   5. CKD 3a: Baseline SCr 1.3-1.6 -Scr slightly above baseline today at 1.78 -BP soft, ARB held  6. HTN - BP not elevated - Meds as above   7. CAD - h/o single vessel PCI - Continue ASA + atorvastatin  20 mg daily    8. OSA: Unable to tolerate CPAP    Discharge Weight: 187 lb  Discharge Vitals: Blood pressure 113/70, pulse 67, temperature 98.7 F (37.1 C), temperature source Oral, resp. rate 18, height 5' 5 (1.651 m), weight 84.8 kg, SpO2 97%.  Physical Exam: General:  Well appearing.  Neck: no JVD Cor: Regular rate & rhythm. No rubs, gallops or murmurs. Lungs: clear Abdomen: soft, nontender, nondistended.  Extremities: no edema Neuro: alert & orientedx3. Affect pleasant   Labs: Lab Results  Component Value Date   WBC 10.3 06/24/2023   HGB 17.0 06/24/2023   HCT 50.0 06/24/2023   MCV 98.9 06/24/2023   PLT 223 06/24/2023    Recent Labs  Lab 06/24/23 1340 06/24/23 1405 06/25/23 0414  NA 136   < > 136  K 4.0   < > 3.5  CL 105   < > 107  CO2 20*  --  20*  BUN 24*   < > 30*  CREATININE 1.63*   < > 1.78*  CALCIUM  9.3  --  9.2  PROT 8.0  --   --   BILITOT 1.0  --   --   ALKPHOS 52  --   --   ALT 54*  --   --   AST 41  --   --   GLUCOSE 84   < > 94   < > = values in this interval not displayed.   Lab Results  Component Value Date   CHOL 167 04/29/2023   HDL 64 04/29/2023   LDLCALC 79 04/29/2023   TRIG 120 04/29/2023   BNP (last 3 results) Recent Labs    03/12/23 1209 04/29/23 1426 06/24/23 1340  BNP 151.5* 214.9* 336.8*    ProBNP (last 3 results) No results for input(s): PROBNP in the last 8760 hours.   Diagnostic Studies/Procedures   US  Abdomen Limited Result Date: 06/24/2023 EXAM: Right Upper Quadrant Abdominal Ultrasound 06/24/2023 06:28:41 PM TECHNIQUE: Real-time ultrasonography of the right upper quadrant of the abdomen was performed. COMPARISON: 06/08/2022 CLINICAL HISTORY: Heart failure (HCC) FINDINGS: LIVER: The liver demonstrates normal echogenicity. No intrahepatic biliary ductal dilatation. No evidence of mass. BILIARY SYSTEM: Layering small gallstones measuring up to 10 mm. No pericholecystic fluid or wall thickening. Negative sonographic Murphy's  sign. Common bile duct is within normal limits measuring 2 mm. OTHER: No right upper quadrant ascites. IMPRESSION: 1. Cholelithiasis, without associated inflammatory changes to suggest acute cholecystitis. Electronically signed by: Zadie Herter MD 06/24/2023 07:04 PM EDT RP Workstation: [id number]   DG Chest Port 1 View Result Date: 06/24/2023 CLINICAL DATA:  cp and dyspnea EXAM: PORTABLE CHEST - 1 VIEW COMPARISON:  February 09, 2023 FINDINGS: Left chest pacemaker/AICD with a single lead terminating in the right ventricle. Right chest wall pulse generator device with a single lead extending outside the field of view in the right neck. No focal airspace consolidation, pleural effusion, or pneumothorax. Moderate cardiomegaly. No  acute fracture or destructive lesions. Multilevel thoracic osteophytosis. IMPRESSION: Cardiomegaly.  No acute cardiopulmonary abnormality. Electronically Signed   By: Rance Burrows M.D.   On: 06/24/2023 14:14    Discharge Medications   Allergies as of 06/25/2023       Reactions   Entresto  [sacubitril -valsartan ] Other (See Comments)   History of angioedema   Lisinopril Shortness Of Breath   Makes lips swell   Other Hives, Swelling   Peanuts   Shellfish Allergy Hives, Swelling   Lip Swelling   Allopurinol  Nausea Only, Other (See Comments)   dizziness   Spironolactone  Other (See Comments)   Breast tenderness        Medication List     STOP taking these medications    losartan  50 MG tablet Commonly known as: COZAAR    nitroGLYCERIN  0.4 MG SL tablet Commonly known as: NITROSTAT        TAKE these medications    allopurinol  300 MG tablet Commonly known as: ZYLOPRIM  TAKE 1 TABLET EVERY DAY (NEED MD APPOINTMENT)   amiodarone  200 MG tablet Commonly known as: Pacerone  Take 400 mg (2 tabs) twice a day X 1 week, 400 mg daily X 1 week, then 200 mg daily What changed:  how much to take how to take this when to take this additional instructions    aspirin  EC 81 MG tablet Take 81 mg by mouth daily. Swallow whole.   atorvastatin  20 MG tablet Commonly known as: LIPITOR Take 1 tablet (20 mg total) by mouth daily.   eplerenone  25 MG tablet Commonly known as: INSPRA  Take 1 tablet (25 mg total) by mouth daily.   furosemide  40 MG tablet Commonly known as: LASIX  Take 1 tablet (40 mg total) by mouth daily.   Jardiance  10 MG Tabs tablet Generic drug: empagliflozin  Take 1 tablet (10 mg total) by mouth daily.   potassium chloride  SA 20 MEQ tablet Commonly known as: KLOR-CON  M Take 1 tablet (20 mEq total) by mouth daily.   traZODone  150 MG tablet Commonly known as: DESYREL  TAKE 1 TABLET AT BEDTIME        Disposition   The patient will be discharged in stable condition to home. Discharge Instructions     Diet - low sodium heart healthy   Complete by: As directed    Increase activity slowly   Complete by: As directed        Follow-up Information     Collins Dean, NP. Go in 30 day(s).   Specialty: Nurse Practitioner Why: Hospital follow upa ppointment schedueld for Monday, July 26, 2023 at 3:10 PM.  PLEASE ARRIVE 10-15 minutes early.  PLEASE call to reschedule/cancel appointment if you CANNOT make it. Contact information: [address] [place] [address] [phone]         [facility] Follow up on 07/02/2023.   Specialty: Cardiology Why: Advanced Heart Failure Clinic 2 PM Entrance C, Free Valet Parking Please bring all meds to appointment Contact information: [address] [place]  [address] [phone]        Tylene Galla, PA-C Follow up on 07/19/2023.   Specialty: Cardiology Why: 8:20 AM Contact information: [address] [place] [address] [phone]                   Duration of Discharge Encounter: 48 minutes  Signed, [facility], LINDSAY N  06/25/2023, 3:07 PM   Patient seen and  examined with the above-signed Advanced Practice Provider  and/or Housestaff. I personally reviewed laboratory data, imaging studies and relevant notes. I independently examined the patient and formulated the important aspects of the plan. I have edited the note to reflect any of my changes or salient points. I have personally discussed the plan with the patient and/or family.  Admitted with ICD shock. EP has seen and loaded with amio   Received 1 dose IV lasix  on admit. Volume status ok.   At baseline NYHA III  Unable to get cMRI due to Barostim  VT now quiescent. EP recommending d/c today  General:  Well appearing. No resp difficulty HEENT: normal Neck: supple. no JVD. Carotids 2+ bilat; no bruits. No lymphadenopathy or thryomegaly appreciated. Cor: PMI nondisplaced. Regular rate & rhythm. No rubs, gallops or murmurs. Lungs: clear Abdomen: soft, nontender, nondistended. No hepatosplenomegaly. No bruits or masses. Good bowel sounds. Extremities: no cyanosis, clubbing, rash, edema Neuro: alert & orientedx3, cranial nerves grossly intact. moves all 4 extremities w/o difficulty. Affect pleasant  Ok for d/c today per EP team. Continue current HF meds.   Will need outpatient PET to look for sarcoid as well as CPX test to risk stratify for potential advanced therapies.   No driving x 6 months.   D/w EP team and HF pharmD personally.   MD d/c time 40 mins  Jules Oar, MD  3:40 PM

## 2023-06-26 LAB — ANGIOTENSIN CONVERTING ENZYME: Angiotensin-Converting Enzyme: 20 U/L (ref 14–82)

## 2023-06-28 ENCOUNTER — Telehealth: Payer: Self-pay | Admitting: *Deleted

## 2023-06-28 LAB — MULTIPLE MYELOMA PANEL, SERUM
Albumin SerPl Elph-Mcnc: 3 g/dL (ref 2.9–4.4)
Albumin/Glob SerPl: 0.9 (ref 0.7–1.7)
Alpha 1: 0.2 g/dL (ref 0.0–0.4)
Alpha2 Glob SerPl Elph-Mcnc: 0.7 g/dL (ref 0.4–1.0)
B-Globulin SerPl Elph-Mcnc: 1.2 g/dL (ref 0.7–1.3)
Gamma Glob SerPl Elph-Mcnc: 1.2 g/dL (ref 0.4–1.8)
Globulin, Total: 3.4 g/dL (ref 2.2–3.9)
IgA: 475 mg/dL — ABNORMAL HIGH (ref 90–386)
IgG (Immunoglobin G), Serum: 1262 mg/dL (ref 603–1613)
IgM (Immunoglobulin M), Srm: 160 mg/dL (ref 20–172)
Total Protein ELP: 6.4 g/dL (ref 6.0–8.5)

## 2023-06-28 LAB — KAPPA/LAMBDA LIGHT CHAINS
Kappa free light chain: 37 mg/L — ABNORMAL HIGH (ref 3.3–19.4)
Kappa, lambda light chain ratio: 1.78 — ABNORMAL HIGH (ref 0.26–1.65)
Lambda free light chains: 20.8 mg/L (ref 5.7–26.3)

## 2023-06-28 NOTE — Transitions of Care (Post Inpatient/ED Visit) (Signed)
   06/28/2023  Name: Tamotsu Wiederholt MRN: [id number] DOB: 01/26/1965  Today's TOC FU Call Status: Today's TOC FU Call Status:: Unsuccessful Call (1st Attempt) Unsuccessful Call (1st Attempt) Date: 06/28/23  Attempted to reach the patient regarding the most recent Inpatient/ED visit.  Follow Up Plan: Additional outreach attempts will be made to reach the patient to complete the Transitions of Care (Post Inpatient/ED visit) call.   Andrea Dimes RN, BSN [place]  [facility] RN Care Manager [phone]

## 2023-06-29 ENCOUNTER — Telehealth: Payer: Self-pay | Admitting: *Deleted

## 2023-06-29 NOTE — Transitions of Care (Post Inpatient/ED Visit) (Signed)
   06/29/2023  Name: Stanley [name] MRN: [id number] DOB: 1965-09-10  Today's TOC FU Call Status: Today's TOC FU Call Status:: Unsuccessful Call (2nd Attempt) Unsuccessful Call (2nd Attempt) Date: 06/29/23  Attempted to reach the patient regarding the most recent Inpatient/ED visit.  Follow Up Plan: Additional outreach attempts will be made to reach the patient to complete the Transitions of Care (Post Inpatient/ED visit) call.   Andrea Dimes RN, BSN [place]  [facility] RN Care Manager [phone]

## 2023-06-30 ENCOUNTER — Telehealth: Payer: Self-pay

## 2023-06-30 NOTE — Transitions of Care (Post Inpatient/ED Visit) (Signed)
   06/30/2023  Name: Stanley [name] MRN: [id number] DOB: [birth year]  Today's TOC FU Call Status: Today's TOC FU Call Status:: Unsuccessful Call (3rd Attempt) Unsuccessful Call (3rd Attempt) Date: 06/30/23  Attempted to reach the patient regarding the most recent Inpatient/ED visit.  Follow Up Plan: No further outreach attempts will be made at this time. We have been unable to contact the patient.  Alan Ee, RN, BSN, CEN [employer]- Transition of Care Team.  [facility] [phone]

## 2023-07-02 ENCOUNTER — Ambulatory Visit (HOSPITAL_COMMUNITY)
Admit: 2023-07-02 | Discharge: 2023-07-02 | Disposition: A | Discharge status: Home / Self Care | Payer: Self-pay | Attending: Family Medicine

## 2023-07-02 VITALS — BP 110/80 | HR 61 | Ht 65.0 in | Wt 189.2 lb

## 2023-07-02 DIAGNOSIS — Z5986 Financial insecurity: Secondary | ICD-10-CM | POA: Insufficient documentation

## 2023-07-02 DIAGNOSIS — I519 Heart disease, unspecified: Secondary | ICD-10-CM

## 2023-07-02 DIAGNOSIS — I5022 Chronic systolic (congestive) heart failure: Secondary | ICD-10-CM | POA: Insufficient documentation

## 2023-07-02 DIAGNOSIS — F1011 Alcohol abuse, in remission: Secondary | ICD-10-CM | POA: Insufficient documentation

## 2023-07-02 DIAGNOSIS — Z79899 Other long term (current) drug therapy: Secondary | ICD-10-CM | POA: Insufficient documentation

## 2023-07-02 DIAGNOSIS — Z955 Presence of coronary angioplasty implant and graft: Secondary | ICD-10-CM | POA: Insufficient documentation

## 2023-07-02 DIAGNOSIS — R0789 Other chest pain: Secondary | ICD-10-CM | POA: Insufficient documentation

## 2023-07-02 DIAGNOSIS — N1831 Chronic kidney disease, stage 3a: Secondary | ICD-10-CM | POA: Insufficient documentation

## 2023-07-02 DIAGNOSIS — I428 Other cardiomyopathies: Secondary | ICD-10-CM | POA: Insufficient documentation

## 2023-07-02 DIAGNOSIS — I2510 Atherosclerotic heart disease of native coronary artery without angina pectoris: Secondary | ICD-10-CM | POA: Insufficient documentation

## 2023-07-02 DIAGNOSIS — I447 Left bundle-branch block, unspecified: Secondary | ICD-10-CM | POA: Insufficient documentation

## 2023-07-02 DIAGNOSIS — I130 Hypertensive heart and chronic kidney disease with heart failure and stage 1 through stage 4 chronic kidney disease, or unspecified chronic kidney disease: Secondary | ICD-10-CM | POA: Insufficient documentation

## 2023-07-02 DIAGNOSIS — Z95810 Presence of automatic (implantable) cardiac defibrillator: Secondary | ICD-10-CM | POA: Insufficient documentation

## 2023-07-02 DIAGNOSIS — I472 Ventricular tachycardia, unspecified: Secondary | ICD-10-CM | POA: Insufficient documentation

## 2023-07-02 DIAGNOSIS — R0601 Orthopnea: Secondary | ICD-10-CM | POA: Insufficient documentation

## 2023-07-02 DIAGNOSIS — Z7984 Long term (current) use of oral hypoglycemic drugs: Secondary | ICD-10-CM | POA: Insufficient documentation

## 2023-07-02 LAB — COMPREHENSIVE METABOLIC PANEL WITH GFR
ALT: 54 U/L — ABNORMAL HIGH (ref 0–44)
AST: 40 U/L (ref 15–41)
Albumin: 3.9 g/dL (ref 3.5–5.0)
Alkaline Phosphatase: 49 U/L (ref 38–126)
Anion gap: 9 (ref 5–15)
BUN: 25 mg/dL — ABNORMAL HIGH (ref 6–20)
CO2: 19 mmol/L — ABNORMAL LOW (ref 22–32)
Calcium: 9.4 mg/dL (ref 8.9–10.3)
Chloride: 107 mmol/L (ref 98–111)
Creatinine, Ser: 1.57 mg/dL — ABNORMAL HIGH (ref 0.61–1.24)
GFR, Estimated: 50 mL/min — ABNORMAL LOW (ref >60)
Glucose, Bld: 94 mg/dL (ref 70–99)
Potassium: 4.1 mmol/L (ref 3.5–5.1)
Sodium: 135 mmol/L (ref 135–145)
Total Bilirubin: 0.8 mg/dL (ref 0.0–1.2)
Total Protein: 8.2 g/dL — ABNORMAL HIGH (ref 6.5–8.1)

## 2023-07-02 LAB — T4, FREE: Free T4: 1.15 ng/dL — ABNORMAL HIGH (ref 0.61–1.12)

## 2023-07-02 LAB — TSH: TSH: 0.877 u[IU]/mL (ref 0.350–4.500)

## 2023-07-02 LAB — BRAIN NATRIURETIC PEPTIDE: B Natriuretic Peptide: 267.6 pg/mL — ABNORMAL HIGH (ref 0.0–100.0)

## 2023-07-02 MED ORDER — LOSARTAN POTASSIUM 25 MG PO TABS: 25.0000 mg | ORAL_TABLET | Freq: Every day | ORAL | 3 refills | Status: DC

## 2023-07-02 NOTE — Progress Notes (Signed)
 Advanced Heart Failure Team Clinic Note  HF Cardiologist: Dr. Cherrie EP: Dr. Inocencio  Chief Complaint:   HPI: Stanley [name] is a 58 y.o. male with systolic HF due to NICM d/x'd in 2006 (felt 2/2 HTN/ETOH), ? CAD s/p PCI to unknown vessel 2011 at outside location (cath 2014 without CAD and normal coronaries by CT 07/2016), VT s/p [facility] ICD (2015), noncompliance, chronic noncardiac chest pain, depression, HTN and ETOH abuse.     In 2017 he received inappropriate therapy for ST with ATP (after a work out at [employer] and reported noncompliance with meds) that degenerated into VT and received several shocks.    Recurrent ICD shock in 3/20 in setting of medicine noncompliance. Amio continued. Echo 3/20 EF 25-30% Normal RV.    S/p Barostim 7/21.   Admitted 6/24 with recurrent VT 2/2 intoxication and metabolic derangement. Echo showed EF < 20%, RV mildly reduced. Required pressors and ICU monitoring due to worsening respiratory failure and ETOH withdrawal.  RHC showed elevated RA pressures, normal PCWP, mild to moderate PAH and decreased output with CI (Thermo) 1.7.    Admitted 2/25 with a/c HF & CAP. Echo showed EF 25-30%, G1DD, normal RV, mild to moderate MR.    He had VT and received ATP followed ICD shock on 06/19. Patient was called by EP and sent to the ED for admission. He appeared compensated from HF standpoint. He was reloaded with IV amiodarone  then converted to po load. 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 cMRI attempted but not compatible with Barostim device. Plan for cardiac PET as outpatient. D/t LBBB and wide QRS > 150 ms, EP will consider CRT-D upgrade at a later date.  He is here today for post hospital CHF follow-up. His home weight is up 4 lb from discharge, now 181 lb. Gets short of breath walking across his apartment. Has chronic 3 pillow orthopnea. Notes a little more abdominal bloating. Feels like he may be  holding onto more fluid. Taking all medications. No ETOH use.     ROS: All systems negative except as listed in HPI, PMH and Problem List.  SH:  Social History   Socioeconomic History   Marital status: Divorced    Spouse name: Not on file   Number of children: 1   Years of education: 12   Highest education level: Not on file  Occupational History   Occupation: Disability  Tobacco Use   Smoking status: Never   Smokeless tobacco: Never  Vaping Use   Vaping status: Never Used  Substance and Sexual Activity   Alcohol use: Not Currently    Comment: Last drink before 06/08/2022   Drug use: No   Sexual activity: Not Currently  Other Topics Concern   Not on file  Social History Narrative   Lives in [place] alone.  Disabled.  Previously worked as a Geophysicist/field seismologist.   Fun: Rest, Read and watch movies.    Social Drivers of Health   Financial Resource Strain: High Risk (06/30/2022)   Overall Financial Resource Strain (CARDIA)    Difficulty of Paying Living Expenses: Hard  Food Insecurity: No Food Insecurity (06/24/2023)   Hunger Vital Sign    Worried About Running Out of Food in the Last Year: Never true    Ran Out of Food in the Last Year: Never true  Transportation Needs: No Transportation Needs (06/24/2023)   PRAPARE - Administrator, Civil Service (Medical): No  Lack of Transportation (Non-Medical): No  Physical Activity: Unknown (03/21/2018)   Exercise Vital Sign    Days of Exercise per Week: Patient declined    Minutes of Exercise per Session: Patient declined  Stress: Stress Concern Present (03/21/2018)   [employer] of Occupational Health - Occupational Stress Questionnaire    Feeling of Stress : To some extent  Social Connections: Patient Declined (06/24/2023)   Social Connection and Isolation Panel    Frequency of Communication with Friends and Family: Patient declined    Frequency of Social Gatherings with Friends and Family: Patient declined     Attends Religious Services: Patient declined    Database administrator or Organizations: Patient declined    Attends Banker Meetings: Patient declined    Marital Status: Patient declined  Intimate Partner Violence: Not At Risk (06/24/2023)   Humiliation, Afraid, Rape, and Kick questionnaire    Fear of Current or Ex-Partner: No    Emotionally Abused: No    Physically Abused: No    Sexually Abused: No    FH:  Family History  Problem Relation Age of Onset   Breast cancer Mother    Pancreatic cancer Mother    Hypertension Father    Alzheimer's disease Father    Gout Father    Dementia Father    Hypertension Sister    Clotting disorder Sister    Obesity Brother    Bronchitis Brother    Cancer Maternal Uncle        unknown type; dx after 28; x2 maternal uncles   Breast cancer Paternal Aunt        dx after 50   Lung cancer Paternal Uncle    Heart disease Maternal Grandmother    Gout Paternal Grandfather    Colon polyps Neg Hx    Colon cancer Neg Hx    Esophageal cancer Neg Hx    Stomach cancer Neg Hx     Past Medical History:  Diagnosis Date   Acute renal failure (ARF) (HCC)    Acute respiratory failure with hypoxia (HCC) 12/29/2018   Alcohol abuse    Anxiety    CAD (coronary artery disease)    a. dx unclear, reported PCI in 2011 but cath 2014 normal coronaries and cardiac CT 07/2016 normal coronaries, no mention of stents.   Chronic chest pain    Chronic systolic CHF (congestive heart failure) (HCC)    Depression    Dyspnea    07/05/2019: per patient some times with exertion, has to catch breath   Family history of breast cancer 05/30/2021   Family history of pancreatic cancer 05/30/2021   Financial difficulties    Gout    Gouty arthritis    Headache    History of noncompliance with medical treatment    financial challenges   Hypertension    ICD (implantable cardioverter-defibrillator) in place    [employer] ICD implant done in [place] 2015   Insomnia     Lobar pneumonia (HCC) 12/28/2018   Myocardial infarction (HCC) 2005   Nonischemic cardiomyopathy (HCC)    Sleep apnea    per patient, has CPAP does not wear it every night   Ventricular tachycardia (HCC) 01/2015   2017 - ATP delivered for sinus tach, degenerated into VT for which he received shock. Recurrent VT 03/2018.    Current Outpatient Medications  Medication Sig Dispense Refill   allopurinol  (ZYLOPRIM ) 300 MG tablet TAKE 1 TABLET EVERY DAY (NEED MD APPOINTMENT) (Patient taking differently: Taking once  daily) 30 tablet 11   amiodarone  (PACERONE ) 200 MG tablet Take 400 mg (2 tabs) twice a day X 1 week, 400 mg daily X 1 week, then 200 mg daily 60 tablet 5   aspirin  EC 81 MG tablet Take 81 mg by mouth daily. Swallow whole.     atorvastatin  (LIPITOR) 20 MG tablet Take 1 tablet (20 mg total) by mouth daily. 30 tablet 6   empagliflozin  (JARDIANCE ) 10 MG TABS tablet Take 1 tablet (10 mg total) by mouth daily. 90 tablet 3   eplerenone  (INSPRA ) 25 MG tablet Take 1 tablet (25 mg total) by mouth daily. 90 tablet 3   furosemide  (LASIX ) 40 MG tablet Take 1 tablet (40 mg total) by mouth daily.     losartan  (COZAAR ) 25 MG tablet Take 1 tablet (25 mg total) by mouth daily. 90 tablet 3   potassium chloride  SA (KLOR-CON  M) 20 MEQ tablet Take 1 tablet (20 mEq total) by mouth daily. 30 tablet 3   traZODone  (DESYREL ) 150 MG tablet TAKE 1 TABLET AT BEDTIME 90 tablet 3   No current facility-administered medications for this encounter.    Vitals:   07/02/23 1352  BP: 110/80  Pulse: 61  SpO2: 97%  Weight: 85.8 kg (189 lb 3.2 oz)  Height: 5' 5 (1.651 m)    PHYSICAL EXAM:  General:  Well appearing.  Neck: JVP 8 Cor: Regular rate & rhythm. No rubs, gallops or murmurs. Lungs: clear Abdomen: nontender, + distended Extremities: no edema Neuro: alert & orientedx3. Affect pleasant.  ECG: Unable to obtain reliable tracing d/t artifact from barostim  [employer] interrogation: Activity 1.8 hrs/day,  no VT/VF, no V pacing    ASSESSMENT & PLAN:   1. Chronic systolic CHF - Mostly NICM (? H/o PCI to unknown vessel in 2011.) CTA 2018 normal cors.  - s/p [employer] ICD + Barostim - EF has remained low ~20-30% since 2020, slight increase in function to 30-35% on 10/24 echo.  - CPX 9/20 moderate to severe HF limitation. pVO2: 14.9 (52% predicted peak VO2).  slope: 31  pRER: 1.05  - RHC (6/24): RA 10, PCWP 11, CO/CI (TD) 2.9/1.78. (required DBA) - Most recent echo (2/25) EF 25-30%, G1DD, normal RV, mild to moderate MR - Etiology of NICM uncertain. EF has remained low despite cutting back ETOH significantly and medication compliance. ACE level normal. Myeloma labs unremarkable. - Unable to complete cMRI d/t barostim. Will arrange cardiac PET as outpatient - NYHA III. Volume mildly up on exam, ReDS 54% (suspect this is overestimated). Increase lasix  to 80 mg daily X 3 days then reduce to 40 mg daily. Increase potassium to 40 mEq BID X 3 days then return to regular dosing - Will arrange CPX to better assess HF limitation. EP considering upgrading device to CRT-D d/t wide QRS. May eventually need to consider advanced therapies. - Continue jardiance  10 mg daily - Continue eplerenone  25 mg daily - Restart losartan  25 mg daily - Hold off on beta blocker for now - labs today, repeat BMET in 2 weeks  2. VT  - H/o Shocks from [employer] ICD (6/24), 2/2 hypomag and hypokalemia, ETOH abuse, and medication noncompliance. - VT s/p ATP/device shock on 06/24/23. No VT on device today - Completing amiodarone  taper.   3. Chronic atypical chest pain - not ischemic in presentation - no current chest pain   4. ETOH abuse - Hx heavy ETOH use, likely contributes to cardiomyopathy.  - Denies current use   5. CKD  3a: Baseline SCr 1.3-1.6 -Labs today - Continue jardiance    6. HTN - BP not elevated - Meds as above   7. CAD - h/o single vessel PCI - Continue ASA + atorvastatin  20 mg daily   8. OSA:  Unable to tolerate CPAP   Follow-up: 2 months

## 2023-07-02 NOTE — Patient Instructions (Signed)
 Re-start Losartan  25 mg daily - Rx sent to requested pharmacy. Take Lasix  80 mg daily for 3 days then return to taking 40 mg daily. Labs today - will call you if abnormal. Repeat labs in 2 weeks - see below. We have ordered a CPX for you - will call you to schedule when prior authorization is obtained. Return to Heart Failure APP Clinic in 2 months - see below. Please call us  at [phone] if any questions or issues prior to your next visit.

## 2023-07-02 NOTE — Progress Notes (Signed)
 ReDS Vest / Clip - 07/02/23 1352       ReDS Vest / Clip   Station Marker A    Ruler Value 32    ReDS Value Range High volume overload    ReDS Actual Value 54

## 2023-07-05 ENCOUNTER — Ambulatory Visit: Payer: Self-pay | Admitting: Physician Assistant

## 2023-07-12 NOTE — Progress Notes (Deleted)
  Cardiology Office Note:   Date:  07/12/2023  ID:  Stanley [name], DOB 1965/05/21, MRN [id number]  Primary Cardiologist: Toribio Fuel, MD Electrophysiologist: Soyla Gladis Norton, MD   History of Present Illness:   Stanley [name] is a 58 y.o. male with h/o HF due to NICM, ETOH abuse, h/o VT, h/o non-compliance, atypical chest pain, depression, and HTN. Pt also reportedly has CAD with PCI to unknown vessel 2011, but cath 2014 without CAD and normal cors by CT 07/2016. Pt is being seen today for routine electrophysiology followup.   Seen in HF clinic 04/29/2023 and was doing OK.  Since last being seen in our clinic the patient reports doing ***.  he denies chest pain, palpitations, dyspnea, PND, orthopnea, nausea, vomiting, dizziness, syncope, edema, weight gain, or early satiety.     Review of systems complete and found to be negative unless listed in HPI.    EP Information / Studies Reviewed:    EKG is not ordered today. EKG from 02/09/2023 reviewed which showed NSR at 98 bpm  ***  Arrhythmia/Device History Barostim (standard) implanted 07/07/2019 for Chronic systolic CHF [employer] single chamber ICD 06/05/2013 for chronic systolic CHF    Barostim Interrogation- Performed personally and reviewed in detail today,  See scanned report  ICD/PPM interrogation - Not performed today. See last [employer] report    Physical Exam:   VS:  There were no vitals taken for this visit.   Wt Readings from Last 3 Encounters:  07/02/23 189 lb 3.2 oz (85.8 kg)  06/25/23 187 lb 0.7 oz (84.8 kg)  04/29/23 183 lb (83 kg)     GEN: Well nourished, well developed in no acute distress NECK: No JVD; No carotid bruits CARDIAC: {EPRHYTHM:28826}, no murmurs, rubs, gallops RESPIRATORY:  Clear to auscultation without rales, wheezing or rhonchi  ABDOMEN: Soft, non-tender, non-distended EXTREMITIES:  {EDEMA LEVEL:28147::No} edema; No deformity   ASSESSMENT AND PLAN:    Chronic systolic CHF s/p  Chief Financial Officer and Barostim implantation NYHA *** symptoms. ***  Device {Blank single:19197::titrated from *** millamp to *** milliamp by increments of 0.4 with good blood pressure response and no stimulation.,programmed at *** for chronic settings}  Device impedence stable. Pt goals are to ***  Normal device function See scanned report. Will follow up in *** weeks to continue titration with goal of 6-8 milliamps for chronic settings.  ICD stable at in person check 09/2022 and on remotes. Checked in HF clinic 05/11/2023  VT Quiescent on amiodarone  200 mg daily  HTN Stable on current regimen   Atypical chest pain Chronic, work up has been reassuring.    Disposition:   Follow up with {EPPROVIDERS:28135::EP Team} in {EPFOLLOW UP:28173}  Signed, Ozell Prentice Passey, PA-C

## 2023-07-16 ENCOUNTER — Ambulatory Visit (HOSPITAL_COMMUNITY)
Admission: RE | Admit: 2023-07-16 | Discharge: 2023-07-16 | Disposition: A | Discharge status: Home / Self Care | Payer: Self-pay | Source: Ambulatory Visit | Attending: Cardiology | Admitting: Cardiology

## 2023-07-16 DIAGNOSIS — I5022 Chronic systolic (congestive) heart failure: Secondary | ICD-10-CM | POA: Insufficient documentation

## 2023-07-16 LAB — BASIC METABOLIC PANEL WITH GFR
Anion gap: 10 (ref 5–15)
BUN: 30 mg/dL — ABNORMAL HIGH (ref 6–20)
CO2: 22 mmol/L (ref 22–32)
Calcium: 9.4 mg/dL (ref 8.9–10.3)
Chloride: 105 mmol/L (ref 98–111)
Creatinine, Ser: 1.94 mg/dL — ABNORMAL HIGH (ref 0.61–1.24)
GFR, Estimated: 39 mL/min — ABNORMAL LOW (ref >60)
Glucose, Bld: 93 mg/dL (ref 70–99)
Potassium: 3.9 mmol/L (ref 3.5–5.1)
Sodium: 137 mmol/L (ref 135–145)

## 2023-07-19 ENCOUNTER — Ambulatory Visit: Payer: Self-pay | Attending: Student | Admitting: Student

## 2023-07-19 DIAGNOSIS — I472 Ventricular tachycardia, unspecified: Secondary | ICD-10-CM

## 2023-07-19 DIAGNOSIS — N1831 Chronic kidney disease, stage 3a: Secondary | ICD-10-CM

## 2023-07-19 DIAGNOSIS — I5022 Chronic systolic (congestive) heart failure: Secondary | ICD-10-CM

## 2023-07-19 DIAGNOSIS — I428 Other cardiomyopathies: Secondary | ICD-10-CM

## 2023-07-20 ENCOUNTER — Encounter: Payer: Self-pay | Admitting: Student

## 2023-07-21 ENCOUNTER — Ambulatory Visit: Payer: Self-pay | Admitting: Nurse Practitioner

## 2023-07-23 ENCOUNTER — Telehealth: Payer: Self-pay | Admitting: Nurse Practitioner

## 2023-07-23 NOTE — Telephone Encounter (Signed)
 Contacted pt lvm to confirmed appt

## 2023-07-26 ENCOUNTER — Ambulatory Visit: Payer: Self-pay | Attending: Nurse Practitioner | Admitting: Nurse Practitioner

## 2023-07-26 ENCOUNTER — Other Ambulatory Visit: Payer: Self-pay

## 2023-07-26 ENCOUNTER — Other Ambulatory Visit (HOSPITAL_COMMUNITY): Payer: Self-pay

## 2023-07-26 ENCOUNTER — Encounter: Payer: Self-pay | Admitting: Nurse Practitioner

## 2023-07-26 VITALS — BP 104/71 | HR 76 | Resp 20 | Ht 65.0 in | Wt 188.6 lb

## 2023-07-26 DIAGNOSIS — Z09 Encounter for follow-up examination after completed treatment for conditions other than malignant neoplasm: Secondary | ICD-10-CM

## 2023-07-26 DIAGNOSIS — M1A00X0 Idiopathic chronic gout, unspecified site, without tophus (tophi): Secondary | ICD-10-CM

## 2023-07-26 DIAGNOSIS — I472 Ventricular tachycardia, unspecified: Secondary | ICD-10-CM

## 2023-07-26 DIAGNOSIS — N189 Chronic kidney disease, unspecified: Secondary | ICD-10-CM

## 2023-07-26 DIAGNOSIS — I5043 Acute on chronic combined systolic (congestive) and diastolic (congestive) heart failure: Secondary | ICD-10-CM

## 2023-07-26 MED ORDER — LOSARTAN POTASSIUM 25 MG PO TABS
25.0000 mg | ORAL_TABLET | Freq: Every day | ORAL | 3 refills | Status: DC
  Filled 2023-07-26: qty 90, 90d supply, fill #0

## 2023-07-26 MED ORDER — ALLOPURINOL 300 MG PO TABS
300.0000 mg | ORAL_TABLET | Freq: Every day | ORAL | 3 refills | Status: DC
  Filled 2023-07-26: qty 90, 90d supply, fill #0

## 2023-07-26 MED ORDER — ATORVASTATIN CALCIUM 20 MG PO TABS
20.0000 mg | ORAL_TABLET | Freq: Every day | ORAL | 6 refills | Status: AC
  Filled 2023-07-26: qty 30, 30d supply, fill #0
  Filled 2023-09-22: qty 30, 30d supply, fill #1

## 2023-07-26 MED ORDER — LOSARTAN POTASSIUM 25 MG PO TABS
25.0000 mg | ORAL_TABLET | Freq: Every day | ORAL | 6 refills | Status: AC
  Filled 2023-07-26: qty 30, 30d supply, fill #0
  Filled 2023-10-13: qty 30, 30d supply, fill #1

## 2023-07-26 NOTE — Patient Instructions (Addendum)
 Contact cardiology through mychart   Questions to ask: Why do I need to stop nitroglycerin ? How many oz of fluids should I be drinking in a 24 hour period Is there any program to get amiodarone  cheaper or free Please send eplerenone  to [facility]

## 2023-07-26 NOTE — Progress Notes (Signed)
 Assessment & Plan:  [name] was seen today for hospitalization follow-up.  Diagnoses and all orders for this visit:  Hospital discharge follow-up Heart failure with reduced ejection fraction Experienced cardiac event with defibrillator shock. Left heart desynchronization noted. Procedure planned for synchronization. Heart transplant considered if unsuccessful. Explained expected fatigue and addressed fear of shocks. - Send MyChart message to cardiologist regarding refill eplerenone  prescription at [facility] and nitroglycerin  use. - Follow up with cardiology as needed.    Acute on chronic combined systolic and diastolic CHF (congestive heart failure) (HCC) -     losartan  (COZAAR ) 25 MG tablet; Take 1 tablet (25 mg total) by mouth daily. -     CMP14+EGFR  Idiopathic chronic gout without tophus, unspecified site -     allopurinol  (ZYLOPRIM ) 300 MG tablet; Take 1 tablet (300 mg total) by mouth daily.  VT (ventricular tachycardia) (HCC) -     atorvastatin  (LIPITOR) 20 MG tablet; Take 1 tablet (20 mg total) by mouth daily.  Chronic kidney disease Increased creatinine suggests potential kidney issues, possibly related to fluid retention and heart failure.  - Repeat creatinine test to monitor kidney function. - patient to Clarify fluid intake recommendations with cardiology.  Medication management Medication cost management needed, especially for amiodarone . - Refill losartan , Jardiance , and atorvastatin  at [facility]. - Instruct to contact cardiology for financial assistance with amiodarone . - Advise use of MyChart for medication inquiries and refills.  General Health Maintenance Advised on healthy lifestyle, dietary modifications, and alcohol avoidance. He has stopped drinking alcohol and is eating well. - Advise to avoid alcohol and manage stress.  Follow-up Advised follow-up in three months or sooner if needed. - Encourage use of MyChart for communication and  medication management.  Patient has been counseled on age-appropriate routine health concerns for screening and prevention. These are reviewed and up-to-date. Referrals have been placed accordingly. Immunizations are up-to-date or declined.    Subjective:   Chief Complaint  Patient presents with   Hospitalization Follow-up     History of Present Illness Stanley [name] is a 58 year old male with heart failure who presents for a hospital follow-up after a recent cardiac event.  He has a past medical history of Acute renal failure, Alcohol abuse, Anxiety, CAD, Chronic systolic CHF, Depression, Financial difficulties, Gout, History of noncompliance with medical treatment, HTN,  ICD, Insomnia, Lobar pneumonia (12/28/2018), MI (2005), Sleep apnea, and Ventricular tachycardia (01/2015).    He is followed by Cardiology for NICM  He was recently hospitalized on 06-24-2023 after receiving ICD shock at home.  He was reloaded with IV amiodarone  then converted to po load. He states he was napping at the time of the event and did not feel the shock from his defibrillator but noticed a metallic taste upon waking. He was taken to the hospital by EMS after being contacted by his cardiologist.  He reports that his cardiologist told him that his left heart is beating slowly and is trying to catch up with the right heart. He is concerned about the possibility of experiencing another shock from his defibrillator. He feels a little tired.  He states he was instructed to stop taking nitroglycerin , which he previously used for chest pain. His current medications include losartan , furosemide , Jardiance , atorvastatin , and amiodarone . He mentions the high cost of amiodarone  and is seeking assistance for this.  He has been instructed to limit his fluid intake. He mentions consuming 80 ounces of fluid per day but is unsure if this amount  has been adjusted by cardiology due to his HF.  Socially, he lives alone and  daughter would like for him to reside with her however he is hesitant due to his preference for living alone and the different lifestyle at his daughter's home. He has stopped drinking alcohol and is mindful of his diet and physical activity to manage his condition.    Blood pressure is well controlled.  BP Readings from Last 3 Encounters:  07/26/23 104/71  07/02/23 110/80  06/25/23 113/70      Review of Systems  Constitutional:  Negative for fever, malaise/fatigue and weight loss.  HENT: Negative.  Negative for nosebleeds.   Eyes: Negative.  Negative for blurred vision, double vision and photophobia.  Respiratory: Negative.  Negative for cough and shortness of breath.   Cardiovascular: Negative.  Negative for chest pain, palpitations and leg swelling.  Gastrointestinal: Negative.  Negative for heartburn, nausea and vomiting.  Musculoskeletal: Negative.  Negative for myalgias.  Neurological: Negative.  Negative for dizziness, focal weakness, seizures and headaches.  Psychiatric/Behavioral: Negative.  Negative for suicidal ideas.     Past Medical History:  Diagnosis Date   Acute renal failure (ARF) (HCC)    Acute respiratory failure with hypoxia (HCC) 12/29/2018   Alcohol abuse    Anxiety    CAD (coronary artery disease)    a. dx unclear, reported PCI in 2011 but cath 2014 normal coronaries and cardiac CT 07/2016 normal coronaries, no mention of stents.   Chronic chest pain    Chronic systolic CHF (congestive heart failure) (HCC)    Depression    Dyspnea    07/05/2019: per patient some times with exertion, has to catch breath   Family history of breast cancer 05/30/2021   Family history of pancreatic cancer 05/30/2021   Financial difficulties    Gout    Gouty arthritis    Headache    History of noncompliance with medical treatment    financial challenges   Hypertension    ICD (implantable cardioverter-defibrillator) in place    [employer] ICD implant done in [place] 2015    Insomnia    Lobar pneumonia (HCC) 12/28/2018   Myocardial infarction (HCC) 2005   Nonischemic cardiomyopathy (HCC)    Sleep apnea    per patient, has CPAP does not wear it every night   Ventricular tachycardia (HCC) 01/2015   2017 - ATP delivered for sinus tach, degenerated into VT for which he received shock. Recurrent VT 03/2018.    Past Surgical History:  Procedure Laterality Date   BAROREFLEX SYSTEM INSERTION     BIOPSY  03/06/2021   Procedure: BIOPSY;  Surgeon: Leigh Elspeth SQUIBB, MD;  Location: [facility];  Service: Gastroenterology;;   CARDIAC DEFIBRILLATOR PLACEMENT  06/05/2013   [employer] Inogen ICD implanted at [facility] in [place] for primary prevention   COLONOSCOPY WITH PROPOFOL  N/A 03/06/2021   Procedure: COLONOSCOPY WITH PROPOFOL ;  Surgeon: Leigh Elspeth SQUIBB, MD;  Location: [facility];  Service: Gastroenterology;  Laterality: N/A;   ESOPHAGEAL DILATION  03/06/2021   Procedure: ESOPHAGEAL DILATION;  Surgeon: Leigh Elspeth SQUIBB, MD;  Location: [facility];  Service: Gastroenterology;;   ESOPHAGOGASTRODUODENOSCOPY (EGD) WITH PROPOFOL  N/A 03/06/2021   Procedure: ESOPHAGOGASTRODUODENOSCOPY (EGD) WITH PROPOFOL ;  Surgeon: Leigh Elspeth SQUIBB, MD;  Location: [facility];  Service: Gastroenterology;  Laterality: N/A;   ESOPHAGOGASTRODUODENOSCOPY (EGD) WITH PROPOFOL  N/A 05/18/2021   Procedure: ESOPHAGOGASTRODUODENOSCOPY (EGD) WITH PROPOFOL ;  Surgeon: Legrand Victory LITTIE DOUGLAS, MD;  Location: [facility] ENDOSCOPY;  Service: Gastroenterology;  Laterality: N/A;   HERNIA REPAIR     PERCUTANEOUS CORONARY STENT INTERVENTION (PCI-S)  01/05/2009   POLYPECTOMY  03/06/2021   Procedure: POLYPECTOMY;  Surgeon: Leigh Elspeth SQUIBB, MD;  Location: [facility];  Service: Gastroenterology;;   RIGHT HEART CATH N/A 06/11/2022   Procedure: RIGHT HEART CATH;  Surgeon: Rolan Ezra RAMAN, MD;  Location: [facility] INVASIVE CV LAB;  Service: Cardiovascular;  Laterality: N/A;    Family  History  Problem Relation Age of Onset   Breast cancer Mother    Pancreatic cancer Mother    Hypertension Father    Alzheimer's disease Father    Gout Father    Dementia Father    Hypertension Sister    Clotting disorder Sister    Obesity Brother    Bronchitis Brother    Cancer Maternal Uncle        unknown type; dx after 77; x2 maternal uncles   Breast cancer Paternal Aunt        dx after 80   Lung cancer Paternal Uncle    Heart disease Maternal Grandmother    Gout Paternal Grandfather    Colon polyps Neg Hx    Colon cancer Neg Hx    Esophageal cancer Neg Hx    Stomach cancer Neg Hx     Social History Reviewed with no changes to be made today.   Outpatient Medications Prior to Visit  Medication Sig Dispense Refill   amiodarone  (PACERONE ) 200 MG tablet Take 400 mg (2 tabs) twice a day X 1 week, 400 mg daily X 1 week, then 200 mg daily 60 tablet 5   aspirin  EC 81 MG tablet Take 81 mg by mouth daily. Swallow whole.     empagliflozin  (JARDIANCE ) 10 MG TABS tablet Take 1 tablet (10 mg total) by mouth daily. 90 tablet 3   eplerenone  (INSPRA ) 25 MG tablet Take 1 tablet (25 mg total) by mouth daily. 90 tablet 3   furosemide  (LASIX ) 40 MG tablet Take 1 tablet (40 mg total) by mouth daily.     potassium chloride  SA (KLOR-CON  M) 20 MEQ tablet Take 1 tablet (20 mEq total) by mouth daily. 30 tablet 3   traZODone  (DESYREL ) 150 MG tablet TAKE 1 TABLET AT BEDTIME 90 tablet 3   allopurinol  (ZYLOPRIM ) 300 MG tablet TAKE 1 TABLET EVERY DAY (NEED MD APPOINTMENT) (Patient taking differently: Taking once daily) 30 tablet 11   atorvastatin  (LIPITOR) 20 MG tablet Take 1 tablet (20 mg total) by mouth daily. 30 tablet 6   losartan  (COZAAR ) 25 MG tablet Take 1 tablet (25 mg total) by mouth daily. 90 tablet 3   No facility-administered medications prior to visit.    Allergies  Allergen Reactions   Entresto  [Sacubitril -Valsartan ] Other (See Comments)    History of angioedema   Lisinopril Shortness  Of Breath    Makes lips swell   Other Hives and Swelling    Peanuts   Shellfish Allergy Hives and Swelling    Lip Swelling   Allopurinol  Nausea Only and Other (See Comments)    dizziness   Spironolactone  Other (See Comments)    Breast tenderness       Objective:    BP 104/71 (BP Location: Left Arm, Patient Position: Sitting, Cuff Size: Normal)   Resp 20   Ht 5' 5 (1.651 m)   SpO2 100%   BMI 31.48 kg/m  Wt Readings from Last 3 Encounters:  07/02/23 189 lb 3.2 oz (85.8 kg)  06/25/23 187 lb 0.7 oz (84.8  kg)  04/29/23 183 lb (83 kg)    Physical Exam Vitals and nursing note reviewed.  Constitutional:      Appearance: He is well-developed.  HENT:     Head: Normocephalic and atraumatic.  Cardiovascular:     Rate and Rhythm: Normal rate and regular rhythm.     Heart sounds: Normal heart sounds. No murmur heard.    No friction rub. No gallop.  Pulmonary:     Effort: Pulmonary effort is normal. No tachypnea or respiratory distress.     Breath sounds: Normal breath sounds. No decreased breath sounds, wheezing, rhonchi or rales.  Chest:     Chest wall: No tenderness.  Musculoskeletal:        General: Normal range of motion.     Cervical back: Normal range of motion.  Skin:    General: Skin is warm and dry.  Neurological:     Mental Status: He is alert and oriented to person, place, and time.     Coordination: Coordination normal.  Psychiatric:        Behavior: Behavior normal. Behavior is cooperative.        Thought Content: Thought content normal.        Judgment: Judgment normal.          Patient has been counseled extensively about nutrition and exercise as well as the importance of adherence with medications and regular follow-up. The patient was given clear instructions to go to ER or return to medical center if symptoms don't improve, worsen or new problems develop. The patient verbalized understanding.   Follow-up: Return in about 3 months (around  10/26/2023).   Haze LELON Servant, FNP-BC [facility] [place], [place] [phone]   07/26/2023, 4:08 PM

## 2023-07-27 ENCOUNTER — Telehealth (HOSPITAL_COMMUNITY): Payer: Self-pay | Admitting: *Deleted

## 2023-07-27 ENCOUNTER — Encounter (HOSPITAL_COMMUNITY): Payer: Self-pay

## 2023-07-27 LAB — CMP14+EGFR
ALT: 85 IU/L — ABNORMAL HIGH (ref 0–44)
AST: 52 IU/L — ABNORMAL HIGH (ref 0–40)
Albumin: 4.1 g/dL (ref 3.8–4.9)
Alkaline Phosphatase: 66 IU/L (ref 44–121)
BUN/Creatinine Ratio: 18 (ref 10–24)
BUN: 34 mg/dL — ABNORMAL HIGH (ref 8–27)
Bilirubin Total: 0.5 mg/dL (ref 0.0–1.2)
CO2: 16 mmol/L — ABNORMAL LOW (ref 20–29)
Calcium: 9.2 mg/dL (ref 8.6–10.2)
Chloride: 106 mmol/L (ref 96–106)
Creatinine, Ser: 1.92 mg/dL — ABNORMAL HIGH (ref 0.76–1.27)
Globulin, Total: 3.3 g/dL (ref 1.5–4.5)
Glucose: 93 mg/dL (ref 70–99)
Potassium: 4.3 mmol/L (ref 3.5–5.2)
Sodium: 139 mmol/L (ref 134–144)
Total Protein: 7.4 g/dL (ref 6.0–8.5)
eGFR: 39 mL/min/1.73 — ABNORMAL LOW (ref >59)

## 2023-07-27 NOTE — Telephone Encounter (Signed)
 Attempted to call patient regarding upcoming cardiac PET appointment. Left message on voicemail with name and callback number  Larey Brick RN Navigator Cardiac Imaging [facility] [phone] Office [phone] Cell

## 2023-07-28 ENCOUNTER — Ambulatory Visit: Payer: Self-pay | Admitting: Nurse Practitioner

## 2023-07-28 ENCOUNTER — Other Ambulatory Visit (HOSPITAL_COMMUNITY): Payer: Self-pay

## 2023-07-28 ENCOUNTER — Encounter (HOSPITAL_COMMUNITY): Payer: Self-pay

## 2023-07-29 ENCOUNTER — Encounter (HOSPITAL_COMMUNITY)
Admission: RE | Admit: 2023-07-29 | Discharge: 2023-07-29 | Disposition: A | Discharge status: Home / Self Care | Payer: Self-pay | Source: Ambulatory Visit | Attending: Physician Assistant | Admitting: Physician Assistant

## 2023-07-29 ENCOUNTER — Encounter (HOSPITAL_COMMUNITY): Payer: Self-pay

## 2023-07-29 DIAGNOSIS — I472 Ventricular tachycardia, unspecified: Secondary | ICD-10-CM | POA: Insufficient documentation

## 2023-07-29 DIAGNOSIS — I5022 Chronic systolic (congestive) heart failure: Secondary | ICD-10-CM | POA: Insufficient documentation

## 2023-08-20 ENCOUNTER — Ambulatory Visit (HOSPITAL_COMMUNITY)
Admission: RE | Admit: 2023-08-20 | Discharge: 2023-08-20 | Disposition: A | Discharge status: Home / Self Care | Payer: Self-pay | Source: Ambulatory Visit | Attending: Cardiology | Admitting: Cardiology

## 2023-08-20 ENCOUNTER — Telehealth (HOSPITAL_COMMUNITY): Payer: Self-pay | Admitting: Licensed Clinical Social Worker

## 2023-08-20 ENCOUNTER — Ambulatory Visit (HOSPITAL_COMMUNITY): Payer: Self-pay | Admitting: Internal Medicine

## 2023-08-20 DIAGNOSIS — I5022 Chronic systolic (congestive) heart failure: Secondary | ICD-10-CM | POA: Insufficient documentation

## 2023-08-20 LAB — BASIC METABOLIC PANEL WITH GFR
Anion gap: 11 (ref 5–15)
BUN: 27 mg/dL — ABNORMAL HIGH (ref 6–20)
CO2: 17 mmol/L — ABNORMAL LOW (ref 22–32)
Calcium: 8.9 mg/dL (ref 8.9–10.3)
Chloride: 109 mmol/L (ref 98–111)
Creatinine, Ser: 1.71 mg/dL — ABNORMAL HIGH (ref 0.61–1.24)
GFR, Estimated: 45 mL/min — ABNORMAL LOW (ref >60)
Glucose, Bld: 97 mg/dL (ref 70–99)
Potassium: 4.1 mmol/L (ref 3.5–5.1)
Sodium: 137 mmol/L (ref 135–145)

## 2023-08-20 NOTE — Telephone Encounter (Signed)
 H&V Care Navigation CSW Progress Note  Clinical Social Worker consulted by staff to assist pt with ride to clinic today.  CSW able to arrange [employer] taxi to bring to clinic for lab.  CSW reviewed chart and saw he has been referred to [facility] center several times to apply for Medicaid to help with financial and transportation concerns.  Patient reports he applied for medicaid with their office and hasn't heard anything.  CSW spoke with [employer] worker who confirms he applied last year and that he never turned in required documents to complete case- informed pt of this.  He will plan to go to [facility] following lab appt to apply again.   SDOH Screenings   Food Insecurity: No Food Insecurity (06/24/2023)  Housing: Unknown (06/24/2023)  Transportation Needs: Unmet Transportation Needs (08/20/2023)  Utilities: Not At Risk (06/24/2023)  Alcohol Screen: Low Risk  (02/07/2019)  Depression (PHQ2-9): High Risk (07/26/2023)  Financial Resource Strain: High Risk (06/30/2022)  Physical Activity: Unknown (03/21/2018)  Social Connections: Patient Declined (06/24/2023)  Stress: Stress Concern Present (03/21/2018)  Tobacco Use: Low Risk  (07/26/2023)   Andriette HILARIO Leech, LCSW Clinical Social Worker [facility] Desk#: [phone] Cell#: [phone]

## 2023-08-24 ENCOUNTER — Telehealth (HOSPITAL_COMMUNITY): Payer: Self-pay | Admitting: Internal Medicine

## 2023-08-26 ENCOUNTER — Telehealth (HOSPITAL_COMMUNITY): Payer: Self-pay | Admitting: Licensed Clinical Social Worker

## 2023-08-26 NOTE — Telephone Encounter (Signed)
 H&V Care Navigation CSW Progress Note  Clinical Social Worker consulted to assist with transportation concerns.  Able to arrange taxi to bring to RN visit tomorrow- pick up from home set for 1:30pm.  Pt also expressed concerns with depression and financial strain- will plan to speak with CSW during visit  SDOH Screenings   Food Insecurity: No Food Insecurity (06/24/2023)  Housing: Unknown (06/24/2023)  Transportation Needs: Unmet Transportation Needs (08/26/2023)  Utilities: Not At Risk (06/24/2023)  Alcohol Screen: Low Risk  (02/07/2019)  Depression (PHQ2-9): High Risk (07/26/2023)  Financial Resource Strain: High Risk (06/30/2022)  Physical Activity: Unknown (03/21/2018)  Social Connections: Patient Declined (06/24/2023)  Stress: Stress Concern Present (03/21/2018)  Tobacco Use: Low Risk  (07/26/2023)   Andriette HILARIO Leech, LCSW Clinical Social Worker [facility] Desk#: [phone] Cell#: [phone]

## 2023-08-27 ENCOUNTER — Ambulatory Visit (HOSPITAL_COMMUNITY)
Admission: RE | Admit: 2023-08-27 | Discharge: 2023-08-27 | Disposition: A | Discharge status: Home / Self Care | Payer: Self-pay | Source: Ambulatory Visit | Attending: Cardiology | Admitting: Cardiology

## 2023-08-27 VITALS — BP 102/68 | HR 50 | Wt 189.1 lb

## 2023-08-27 DIAGNOSIS — Z5986 Financial insecurity: Secondary | ICD-10-CM | POA: Insufficient documentation

## 2023-08-27 DIAGNOSIS — Z59811 Housing instability, housed, with risk of homelessness: Secondary | ICD-10-CM | POA: Insufficient documentation

## 2023-08-27 DIAGNOSIS — Z95818 Presence of other cardiac implants and grafts: Secondary | ICD-10-CM | POA: Insufficient documentation

## 2023-08-27 DIAGNOSIS — I5022 Chronic systolic (congestive) heart failure: Secondary | ICD-10-CM

## 2023-08-27 DIAGNOSIS — Z4509 Encounter for adjustment and management of other cardiac device: Secondary | ICD-10-CM | POA: Insufficient documentation

## 2023-08-27 NOTE — Addendum Note (Signed)
 Encounter addended by: Cathern Andriette DEL, LCSW on: 08/27/2023 3:39 PM  Actions taken: Flowsheet data copied forward, Flowsheet accepted, Clinical Note Signed

## 2023-08-27 NOTE — Progress Notes (Signed)
   Reason for Visit: Barostim Follow up  ID: Stanley [name], DOB [birth year], MRN [id number]  Primary HF MD: None Primary EP MD:      Treysen Lybeck is a 58 y.o. male who previously underwent Barostim Implantation and presents today for in clinic battery/device check.  Vitals:   08/27/23 1357  BP: 102/68  Pulse: (!) 50  SpO2: 96%    Device History:  Barostim device implanted 07/26/2019   Programming:   Amplitude: 5.8  v Pulse Width: 125 s  Impedance: Stable- 1093   Battery Life: 10.3 months Estimated date of battery replacement: 07/07/2024   Continue every 6 month follow up for battery checks. SABRA Next visit will coordinate with EP for gen change.   Greig Mosses, NP

## 2023-08-27 NOTE — Progress Notes (Signed)
 H&V Care Navigation CSW Progress Note  Clinical Social Worker met with pt in clinic to discuss current SDOH concerns.  Pt states he is not sure how he is going to pay for Sept rent- has about $400 of his total of $705.  Discussed utilizing patient care fund- can assist with $240 of the balance.  Patient then discussed current concerns with depression.  Struggling financially which makes him feel down about himself.  Discussed coping mechanisms- states he likes to spend time with family, read books, take a walk.  Encouraged him to focus on small steps at this time- he will commit to taking a walk each morning.  Pt reports he has engaged in counseling in the past and is agreeable to doing so again- CSW will assist in connecting to local options.  Will plan to follow up with pt in clinic next week  SDOH Screenings   Food Insecurity: No Food Insecurity (06/24/2023)  Housing: Unknown (06/24/2023)  Transportation Needs: Unmet Transportation Needs (08/27/2023)  Utilities: Not At Risk (06/24/2023)  Alcohol Screen: Low Risk  (02/07/2019)  Depression (PHQ2-9): High Risk (07/26/2023)  Financial Resource Strain: High Risk (08/27/2023)  Physical Activity: Unknown (03/21/2018)  Social Connections: Patient Declined (06/24/2023)  Stress: Stress Concern Present (03/21/2018)  Tobacco Use: Low Risk  (07/26/2023)   Andriette HILARIO Leech, LCSW Clinical Social Worker [facility] Desk#: [phone] Cell#: [phone]

## 2023-08-27 NOTE — Patient Instructions (Signed)
 Great to see you today!!!  Your Barostim device is functioning properly.  We will call you in 6 months February 2026) to schedule another battery check

## 2023-08-31 ENCOUNTER — Telehealth (HOSPITAL_COMMUNITY): Payer: Self-pay | Admitting: *Deleted

## 2023-08-31 ENCOUNTER — Telehealth (HOSPITAL_COMMUNITY): Payer: Self-pay | Admitting: Licensed Clinical Social Worker

## 2023-08-31 ENCOUNTER — Telehealth (HOSPITAL_COMMUNITY): Payer: Self-pay

## 2023-08-31 NOTE — Progress Notes (Signed)
 Advanced Heart Failure Team Clinic Note  HF Cardiologist: Dr. Cherrie EP: Dr. Inocencio  Chief Complaint:   HPI: Stanley [name] is a 58 y.o. male with systolic HF due to NICM d/x'd in 2006 (felt 2/2 HTN/ETOH), ? CAD s/p PCI to unknown vessel 2011 at outside location (cath 2014 without CAD and normal coronaries by CT 07/2016), VT s/p [facility] ICD (2015), noncompliance, chronic noncardiac chest pain, depression, HTN and ETOH abuse.     In 2017 he received inappropriate therapy for ST with ATP (after a work out at [employer] and reported noncompliance with meds) that degenerated into VT and received several shocks.    Recurrent ICD shock in 3/20 in setting of medicine noncompliance. Amio continued. Echo 3/20 EF 25-30% Normal RV.    S/p Barostim 7/21.   Admitted 6/24 with recurrent VT 2/2 intoxication and metabolic derangement. Echo showed EF < 20%, RV mildly reduced. Required pressors and ICU monitoring due to worsening respiratory failure and ETOH withdrawal.  RHC showed elevated RA pressures, normal PCWP, mild to moderate PAH and decreased output with CI (Thermo) 1.7.    Admitted 2/25 with a/c HF & CAP. Echo showed EF 25-30%, G1DD, normal RV, mild to moderate MR.    He had VT and received ATP followed ICD shock on 06/19. Patient was called by EP and sent to the ED for admission. He appeared compensated from HF standpoint. He was reloaded with IV amiodarone  then converted to po load. 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 cMRI attempted but not compatible with Barostim device. Plan for cardiac PET as outpatient. D/t LBBB and wide QRS > 150 ms, EP will consider CRT-D upgrade at a later date.  He is here today for post hospital CHF follow-up. His home weight is up 4 lb from discharge, now 181 lb. Gets short of breath walking across his apartment. Has chronic 3 pillow orthopnea. Notes a little more abdominal bloating. Feels like he may be  holding onto more fluid. Taking all medications. No ETOH use.     ROS: All systems negative except as listed in HPI, PMH and Problem List.  SH:  Social History   Socioeconomic History   Marital status: Divorced    Spouse name: Not on file   Number of children: 1   Years of education: 12   Highest education level: Not on file  Occupational History   Occupation: Disability  Tobacco Use   Smoking status: Never   Smokeless tobacco: Never  Vaping Use   Vaping status: Never Used  Substance and Sexual Activity   Alcohol use: Not Currently    Comment: Last drink before 06/08/2022   Drug use: No   Sexual activity: Not Currently  Other Topics Concern   Not on file  Social History Narrative   Lives in [place] alone.  Disabled.  Previously worked as a Geophysicist/field seismologist.   Fun: Rest, Read and watch movies.    Social Drivers of Health   Financial Resource Strain: High Risk (08/27/2023)   Overall Financial Resource Strain (CARDIA)    Difficulty of Paying Living Expenses: Hard  Food Insecurity: No Food Insecurity (06/24/2023)   Hunger Vital Sign    Worried About Running Out of Food in the Last Year: Never true    Ran Out of Food in the Last Year: Never true  Transportation Needs: Unmet Transportation Needs (08/27/2023)   PRAPARE - Administrator, Civil Service (Medical): Yes  Lack of Transportation (Non-Medical): Yes  Physical Activity: Unknown (03/21/2018)   Exercise Vital Sign    Days of Exercise per Week: Patient declined    Minutes of Exercise per Session: Patient declined  Stress: Stress Concern Present (03/21/2018)   [employer] of Occupational Health - Occupational Stress Questionnaire    Feeling of Stress : To some extent  Social Connections: Patient Declined (06/24/2023)   Social Connection and Isolation Panel    Frequency of Communication with Friends and Family: Patient declined    Frequency of Social Gatherings with Friends and Family: Patient  declined    Attends Religious Services: Patient declined    Database administrator or Organizations: Patient declined    Attends Banker Meetings: Patient declined    Marital Status: Patient declined  Intimate Partner Violence: Not At Risk (06/24/2023)   Humiliation, Afraid, Rape, and Kick questionnaire    Fear of Current or Ex-Partner: No    Emotionally Abused: No    Physically Abused: No    Sexually Abused: No    FH:  Family History  Problem Relation Age of Onset   Breast cancer Mother    Pancreatic cancer Mother    Hypertension Father    Alzheimer's disease Father    Gout Father    Dementia Father    Hypertension Sister    Clotting disorder Sister    Obesity Brother    Bronchitis Brother    Cancer Maternal Uncle        unknown type; dx after 73; x2 maternal uncles   Breast cancer Paternal Aunt        dx after 50   Lung cancer Paternal Uncle    Heart disease Maternal Grandmother    Gout Paternal Grandfather    Colon polyps Neg Hx    Colon cancer Neg Hx    Esophageal cancer Neg Hx    Stomach cancer Neg Hx     Past Medical History:  Diagnosis Date   Acute renal failure (ARF) (HCC)    Acute respiratory failure with hypoxia (HCC) 12/29/2018   Alcohol abuse    Anxiety    CAD (coronary artery disease)    a. dx unclear, reported PCI in 2011 but cath 2014 normal coronaries and cardiac CT 07/2016 normal coronaries, no mention of stents.   Chronic chest pain    Chronic systolic CHF (congestive heart failure) (HCC)    Depression    Dyspnea    07/05/2019: per patient some times with exertion, has to catch breath   Family history of breast cancer 05/30/2021   Family history of pancreatic cancer 05/30/2021   Financial difficulties    Gout    Gouty arthritis    Headache    History of noncompliance with medical treatment    financial challenges   Hypertension    ICD (implantable cardioverter-defibrillator) in place    [place] [facility] ICD implant done in [place] 2015    Insomnia    Lobar pneumonia (HCC) 12/28/2018   Myocardial infarction (HCC) 2005   Nonischemic cardiomyopathy (HCC)    Sleep apnea    per patient, has CPAP does not wear it every night   Ventricular tachycardia (HCC) 01/2015   2017 - ATP delivered for sinus tach, degenerated into VT for which he received shock. Recurrent VT 03/2018.    Current Outpatient Medications  Medication Sig Dispense Refill   allopurinol  (ZYLOPRIM ) 300 MG tablet Take 1 tablet (300 mg total) by mouth daily. 90 tablet 3  amiodarone  (PACERONE ) 200 MG tablet Take 400 mg (2 tabs) twice a day X 1 week, 400 mg daily X 1 week, then 200 mg daily 60 tablet 5   aspirin  EC 81 MG tablet Take 81 mg by mouth daily. Swallow whole.     atorvastatin  (LIPITOR) 20 MG tablet Take 1 tablet (20 mg total) by mouth daily. 30 tablet 6   empagliflozin  (JARDIANCE ) 10 MG TABS tablet Take 1 tablet (10 mg total) by mouth daily. 90 tablet 3   eplerenone  (INSPRA ) 25 MG tablet Take 1 tablet (25 mg total) by mouth daily. 90 tablet 3   furosemide  (LASIX ) 40 MG tablet Take 1 tablet (40 mg total) by mouth daily.     losartan  (COZAAR ) 25 MG tablet Take 1 tablet (25 mg total) by mouth daily. 30 tablet 6   potassium chloride  SA (KLOR-CON  M) 20 MEQ tablet Take 1 tablet (20 mEq total) by mouth daily. 30 tablet 3   traZODone  (DESYREL ) 150 MG tablet TAKE 1 TABLET AT BEDTIME 90 tablet 3   No current facility-administered medications for this visit.    There were no vitals filed for this visit.   PHYSICAL EXAM:  General:  Well appearing.  Neck: JVP 8 Cor: Regular rate & rhythm. No rubs, gallops or murmurs. Lungs: clear Abdomen: nontender, + distended Extremities: no edema Neuro: alert & orientedx3. Affect pleasant.  ECG: Unable to obtain reliable tracing d/t artifact from barostim  [employer] interrogation: Activity 1.8 hrs/day, no VT/VF, no V pacing    ASSESSMENT & PLAN:   1. Chronic systolic CHF - Mostly NICM (? H/o PCI to unknown vessel in  2011.) CTA 2018 normal cors.  - s/p [employer] ICD + Barostim - EF has remained low ~20-30% since 2020, slight increase in function to 30-35% on 10/24 echo.  - CPX 9/20 moderate to severe HF limitation. pVO2: 14.9 (52% predicted peak VO2).  slope: 31  pRER: 1.05  - RHC (6/24): RA 10, PCWP 11, CO/CI (TD) 2.9/1.78. (required DBA) - Most recent echo (2/25) EF 25-30%, G1DD, normal RV, mild to moderate MR - Etiology of NICM uncertain. EF has remained low despite cutting back ETOH significantly and medication compliance. ACE level normal. Myeloma labs unremarkable. - Unable to complete cMRI d/t barostim. Will arrange cardiac PET as outpatient - NYHA III. Volume mildly up on exam, ReDS 54% (suspect this is overestimated). Increase lasix  to 80 mg daily X 3 days then reduce to 40 mg daily. Increase potassium to 40 mEq BID X 3 days then return to regular dosing - Will arrange CPX to better assess HF limitation. EP considering upgrading device to CRT-D d/t wide QRS. May eventually need to consider advanced therapies. - Continue jardiance  10 mg daily - Continue eplerenone  25 mg daily - Restart losartan  25 mg daily - Hold off on beta blocker for now - labs today, repeat BMET in 2 weeks  2. VT  - H/o Shocks from [employer] ICD (6/24), 2/2 hypomag and hypokalemia, ETOH abuse, and medication noncompliance. - VT s/p ATP/device shock on 06/24/23. No VT on device today - Completing amiodarone  taper.   3. Chronic atypical chest pain - not ischemic in presentation - no current chest pain   4. ETOH abuse - Hx heavy ETOH use, likely contributes to cardiomyopathy.  - Denies current use   5. CKD 3a: Baseline SCr 1.3-1.6 -Labs today - Continue jardiance    6. HTN - BP not elevated - Meds as above   7. CAD - h/o  single vessel PCI - Continue ASA + atorvastatin  20 mg daily   8. OSA: Unable to tolerate CPAP   Follow-up: 2 months

## 2023-08-31 NOTE — Telephone Encounter (Signed)
 H&V Care Navigation CSW Progress Note  Clinical Social Worker called pt management company, [employer] [phone], and requested invoice and w-9 to assist with partial rent payment.  Awaiting return call.   SDOH Screenings   Food Insecurity: No Food Insecurity (06/24/2023)  Housing: Unknown (06/24/2023)  Transportation Needs: Unmet Transportation Needs (08/27/2023)  Utilities: Not At Risk (06/24/2023)  Alcohol Screen: Low Risk  (02/07/2019)  Depression (PHQ2-9): High Risk (07/26/2023)  Financial Resource Strain: High Risk (08/27/2023)  Physical Activity: Unknown (03/21/2018)  Social Connections: Patient Declined (06/24/2023)  Stress: Stress Concern Present (03/21/2018)  Tobacco Use: Low Risk  (07/26/2023)   Andriette HILARIO Leech, LCSW Clinical Social Worker Advanced [facility] Failure Clinic Desk#: [phone] Cell#: [phone]

## 2023-08-31 NOTE — Telephone Encounter (Signed)
 Attempted to call patient regarding upcoming cardiac PET appointment. Left message on voicemail with name and callback number  Larey Brick RN Navigator Cardiac Imaging [facility] [phone] Office [phone] Cell

## 2023-08-31 NOTE — Telephone Encounter (Signed)
 Called to confirm/remind patient of their appointment at the Advanced Heart Failure Clinic on 09/01/23.   Appointment:   [x] Confirmed  [] Left mess   [] No answer/No voice mail  [] VM Full/unable to leave message  [] Phone not in service  Patient reminded to bring all medications and/or complete list.  Confirmed patient has transportation. Gave directions, instructed to utilize valet parking.

## 2023-09-01 ENCOUNTER — Ambulatory Visit (HOSPITAL_COMMUNITY)
Admission: RE | Admit: 2023-09-01 | Discharge: 2023-09-01 | Disposition: A | Discharge status: Home / Self Care | Payer: Self-pay | Source: Ambulatory Visit | Attending: Family Medicine | Admitting: Family Medicine

## 2023-09-01 ENCOUNTER — Encounter (HOSPITAL_COMMUNITY): Payer: Self-pay

## 2023-09-01 ENCOUNTER — Other Ambulatory Visit: Payer: Self-pay

## 2023-09-01 ENCOUNTER — Other Ambulatory Visit (HOSPITAL_COMMUNITY): Payer: Self-pay

## 2023-09-01 VITALS — BP 118/72 | HR 70 | Ht 65.0 in | Wt 190.8 lb

## 2023-09-01 DIAGNOSIS — Z7982 Long term (current) use of aspirin: Secondary | ICD-10-CM | POA: Insufficient documentation

## 2023-09-01 DIAGNOSIS — I472 Ventricular tachycardia, unspecified: Secondary | ICD-10-CM

## 2023-09-01 DIAGNOSIS — Z955 Presence of coronary angioplasty implant and graft: Secondary | ICD-10-CM | POA: Insufficient documentation

## 2023-09-01 DIAGNOSIS — I5022 Chronic systolic (congestive) heart failure: Secondary | ICD-10-CM

## 2023-09-01 DIAGNOSIS — Z79899 Other long term (current) drug therapy: Secondary | ICD-10-CM | POA: Insufficient documentation

## 2023-09-01 DIAGNOSIS — I447 Left bundle-branch block, unspecified: Secondary | ICD-10-CM | POA: Insufficient documentation

## 2023-09-01 DIAGNOSIS — I10 Essential (primary) hypertension: Secondary | ICD-10-CM

## 2023-09-01 DIAGNOSIS — G4733 Obstructive sleep apnea (adult) (pediatric): Secondary | ICD-10-CM

## 2023-09-01 DIAGNOSIS — I2510 Atherosclerotic heart disease of native coronary artery without angina pectoris: Secondary | ICD-10-CM

## 2023-09-01 DIAGNOSIS — I428 Other cardiomyopathies: Secondary | ICD-10-CM | POA: Insufficient documentation

## 2023-09-01 DIAGNOSIS — F32A Depression, unspecified: Secondary | ICD-10-CM | POA: Insufficient documentation

## 2023-09-01 DIAGNOSIS — Z7984 Long term (current) use of oral hypoglycemic drugs: Secondary | ICD-10-CM | POA: Insufficient documentation

## 2023-09-01 DIAGNOSIS — R0789 Other chest pain: Secondary | ICD-10-CM | POA: Insufficient documentation

## 2023-09-01 DIAGNOSIS — I110 Hypertensive heart disease with heart failure: Secondary | ICD-10-CM | POA: Insufficient documentation

## 2023-09-01 DIAGNOSIS — F1010 Alcohol abuse, uncomplicated: Secondary | ICD-10-CM

## 2023-09-01 DIAGNOSIS — N1831 Chronic kidney disease, stage 3a: Secondary | ICD-10-CM

## 2023-09-01 DIAGNOSIS — R079 Chest pain, unspecified: Secondary | ICD-10-CM

## 2023-09-01 MED ORDER — FUROSEMIDE 40 MG PO TABS
ORAL_TABLET | ORAL | 3 refills | Status: AC
Start: 2023-09-01 — End: ?
  Filled 2023-09-01: qty 270, 90d supply, fill #0

## 2023-09-01 MED ORDER — POTASSIUM CHLORIDE CRYS ER 20 MEQ PO TBCR
40.0000 meq | EXTENDED_RELEASE_TABLET | Freq: Every day | ORAL | 3 refills | Status: DC
  Filled 2023-09-01: qty 180, 90d supply, fill #0
  Filled 2023-10-13: qty 60, 30d supply, fill #0

## 2023-09-01 NOTE — Progress Notes (Signed)
 ReDS Vest / Clip - 09/01/23 1400       ReDS Vest / Clip   Station Marker A    Ruler Value 28    ReDS Value Range High volume overload    ReDS Actual Value 53

## 2023-09-01 NOTE — Patient Instructions (Signed)
 Increase Lasix  to 80 mg in am and 40 mg in pm - updated Rx sent Increase potassium to 40 meq daily - updated Rx sent. Please get labs in 7 - 10 days. See below. We have ordered a CPX (cardio pulmonary exercise test) for you - see below. We have sent a referral to Pulmonary for sleep evaluation and your inability to wear your CPAP. They should call you for first appointment - see below.  Please return to see Dr. Cherrie in 3 months. CALL [phone] IN OCTOBER TO SCHEDULE THIS APPOINTMENT. Please call us  at [phone] if any questions or concerns prior to your next appointment.    Your physician has recommended that you have a cardiopulmonary stress test (CPX). CPX testing is a non-invasive measurement of heart and lung function. It replaces a traditional treadmill stress test. This type of test provides a tremendous amount of information that relates not only to your present condition but also for future outcomes. This test combines measurements of you ventilation, respiratory gas exchange in the lungs, electrocardiogram (EKG), blood pressure and physical response before, during, and following an exercise protocol.

## 2023-09-02 ENCOUNTER — Encounter (HOSPITAL_COMMUNITY): Admission: RE | Admit: 2023-09-02 | Payer: Self-pay | Source: Ambulatory Visit

## 2023-09-02 ENCOUNTER — Other Ambulatory Visit: Payer: Self-pay

## 2023-09-02 NOTE — Addendum Note (Signed)
 Addended by: VICCI SELLER A on: 09/02/2023 09:57 AM   Modules accepted: Orders

## 2023-09-02 NOTE — Progress Notes (Signed)
 Remote ICD transmission.

## 2023-09-03 ENCOUNTER — Telehealth (HOSPITAL_COMMUNITY): Payer: Self-pay | Admitting: Licensed Clinical Social Worker

## 2023-09-03 NOTE — Telephone Encounter (Signed)
 H&V Care Navigation CSW Progress Note  Clinical Social Worker informed pt that I had not been able to speak with management regarding necessary paperwork.  Pt contacted them and had them email me forms but unable to access- CSW requested they send again.    CSW provided [facility] information to pt who will call to arrange virtual appt.   SDOH Screenings   Food Insecurity: No Food Insecurity (06/24/2023)  Housing: Unknown (06/24/2023)  Transportation Needs: Unmet Transportation Needs (08/27/2023)  Utilities: Not At Risk (06/24/2023)  Alcohol Screen: Low Risk  (02/07/2019)  Depression (PHQ2-9): High Risk (07/26/2023)  Financial Resource Strain: High Risk (08/27/2023)  Physical Activity: Unknown (03/21/2018)  Social Connections: Patient Declined (06/24/2023)  Stress: Stress Concern Present (03/21/2018)  Tobacco Use: Low Risk  (09/01/2023)   Andriette HILARIO Leech, LCSW Clinical Social Worker Advanced [facility] Failure Clinic Desk#: [phone] Cell#: [phone]

## 2023-09-08 ENCOUNTER — Ambulatory Visit (HOSPITAL_COMMUNITY): Payer: Self-pay | Admitting: Family Medicine

## 2023-09-08 ENCOUNTER — Telehealth (HOSPITAL_COMMUNITY): Payer: Self-pay | Admitting: Licensed Clinical Social Worker

## 2023-09-08 ENCOUNTER — Ambulatory Visit (HOSPITAL_COMMUNITY)
Admission: RE | Admit: 2023-09-08 | Discharge: 2023-09-08 | Disposition: A | Discharge status: Home / Self Care | Payer: Self-pay | Source: Ambulatory Visit | Attending: Cardiology | Admitting: Cardiology

## 2023-09-08 DIAGNOSIS — I472 Ventricular tachycardia, unspecified: Secondary | ICD-10-CM | POA: Insufficient documentation

## 2023-09-08 LAB — BASIC METABOLIC PANEL WITH GFR
Anion gap: 12 (ref 5–15)
BUN: 32 mg/dL — ABNORMAL HIGH (ref 6–20)
CO2: 23 mmol/L (ref 22–32)
Calcium: 9.2 mg/dL (ref 8.9–10.3)
Chloride: 101 mmol/L (ref 98–111)
Creatinine, Ser: 1.79 mg/dL — ABNORMAL HIGH (ref 0.61–1.24)
GFR, Estimated: 43 mL/min — ABNORMAL LOW (ref >60)
Glucose, Bld: 99 mg/dL (ref 70–99)
Potassium: 3.6 mmol/L (ref 3.5–5.1)
Sodium: 136 mmol/L (ref 135–145)

## 2023-09-08 LAB — BRAIN NATRIURETIC PEPTIDE: B Natriuretic Peptide: 75.7 pg/mL (ref 0.0–100.0)

## 2023-09-08 NOTE — Telephone Encounter (Signed)
 H&V Care Navigation CSW Progress Note  Pt requested help with ride to appt today- taxi arranged  09/08/2023  Stanley [name] DOB: [birth year] MRN: [id number]   RIDER WAIVER AND RELEASE OF LIABILITY  For the purposes of helping with transportation needs, [place] partners with outside transportation providers (taxi companies, [place], Catering manager.) to give [employer] patients or other approved people the choice of on-demand rides Public librarian) to our buildings for non-emergency visits.  By using [employer], I, the person signing this document, on behalf of myself and/or any legal minors (in my care using the [employer]), agree:  Science writer given to me are supplied by independent, outside transportation providers who do not work for, or have any affiliation with, [employer]. [place] is not a transportation company. [place] has no control over the quality or safety of the rides I get using [employer]. [place] has no control over whether any outside ride will happen on time or not. [place] gives no guarantee on the reliability, quality, safety, or availability on any rides, or that no mistakes will happen. I know and accept that traveling by vehicle (car, truck, SVU, fleeta, bus, taxi, etc.) has risks of serious injuries such as disability, being paralyzed, and death. I know and agree the risk of using [employer] is mine alone, and not [employer]. [employer] are provided as is and as are available. The transportation providers are in charge for all inspections and care of the vehicles used to provide these rides. I agree not to take legal action against [place], its agents, employees, officers, directors, representatives, insurers, attorneys, assigns, successors, subsidiaries, and affiliates at any time for any reasons related directly or indirectly to using [employer]. I also agree not to take legal action against [facility] or its affiliates for any injury, death, or damage to property caused by or related to using [employer]. I have read this Waiver and Release of Liability, and I understand the terms used in it and their legal meaning. This Waiver is freely and voluntarily given with the understanding that my right (or any legal minors) to legal action against  relating to [employer] is knowingly given up to use these services.   I attest that I read the Ride Waiver and Release of Liability to [name], gave [name] the opportunity to ask questions and answered the questions asked (if any). I affirm that [name] then provided consent for assistance with transportation.     [name]

## 2023-09-09 ENCOUNTER — Encounter: Payer: Self-pay | Admitting: Family Medicine

## 2023-09-09 ENCOUNTER — Telehealth (HOSPITAL_COMMUNITY): Payer: Self-pay | Admitting: Licensed Clinical Social Worker

## 2023-09-09 NOTE — Telephone Encounter (Signed)
 H&V Care Navigation CSW Progress Note  All required paperwork received for patient care fund assistance- check request for $240 submitted.  Patient aware and understands he is responsible for the remainder of his rent and that rent payment will be past the due date for his apartment.  Promise letter emailed to management company to hopefully avoid late fees or eviction proceedings.  Andriette HILARIO Leech, LCSW Clinical Social Worker [facility] Clinic Desk#: [phone] Cell#: [phone]

## 2023-09-14 ENCOUNTER — Telehealth (HOSPITAL_COMMUNITY): Payer: Self-pay | Admitting: *Deleted

## 2023-09-14 NOTE — Telephone Encounter (Signed)
 Attempted to call patient regarding upcoming cardiac PET appointment. Left message on voicemail with name and callback number  Larey Brick RN Navigator Cardiac Imaging [facility] [phone] Office [phone] Cell

## 2023-09-15 ENCOUNTER — Telehealth (HOSPITAL_COMMUNITY): Payer: Self-pay | Admitting: Licensed Clinical Social Worker

## 2023-09-15 NOTE — Telephone Encounter (Signed)
 H&V Care Navigation CSW Progress Note  Clinical Social Worker received check for pt partial rent payment- pt informed and CSW mailed to [employer].   SDOH Screenings   Food Insecurity: No Food Insecurity (06/24/2023)  Housing: Unknown (06/24/2023)  Transportation Needs: Unmet Transportation Needs (09/08/2023)  Utilities: Not At Risk (06/24/2023)  Alcohol Screen: Low Risk  (02/07/2019)  Depression (PHQ2-9): High Risk (07/26/2023)  Financial Resource Strain: High Risk (08/27/2023)  Physical Activity: Unknown (03/21/2018)  Social Connections: Patient Declined (06/24/2023)  Stress: Stress Concern Present (03/21/2018)  Tobacco Use: Low Risk  (09/01/2023)    Stanley HILARIO Leech, LCSW Clinical Social Worker [facility] Desk#: [phone] Cell#: [phone]

## 2023-09-16 ENCOUNTER — Ambulatory Visit (HOSPITAL_COMMUNITY): Admission: RE | Admit: 2023-09-16 | Payer: Self-pay | Source: Ambulatory Visit

## 2023-09-21 ENCOUNTER — Ambulatory Visit (HOSPITAL_COMMUNITY): Payer: Self-pay | Attending: Internal Medicine

## 2023-09-21 DIAGNOSIS — I5043 Acute on chronic combined systolic (congestive) and diastolic (congestive) heart failure: Secondary | ICD-10-CM

## 2023-09-21 DIAGNOSIS — I5022 Chronic systolic (congestive) heart failure: Secondary | ICD-10-CM | POA: Insufficient documentation

## 2023-09-22 ENCOUNTER — Other Ambulatory Visit (HOSPITAL_COMMUNITY): Payer: Self-pay

## 2023-09-22 ENCOUNTER — Other Ambulatory Visit: Payer: Self-pay | Admitting: Nurse Practitioner

## 2023-09-22 ENCOUNTER — Ambulatory Visit (INDEPENDENT_AMBULATORY_CARE_PROVIDER_SITE_OTHER): Payer: Medicaid Other

## 2023-09-22 ENCOUNTER — Other Ambulatory Visit (HOSPITAL_COMMUNITY): Payer: Self-pay | Admitting: Family Medicine

## 2023-09-22 DIAGNOSIS — I5022 Chronic systolic (congestive) heart failure: Secondary | ICD-10-CM

## 2023-09-22 MED ORDER — EPLERENONE 25 MG PO TABS
12.5000 mg | ORAL_TABLET | Freq: Every day | ORAL | 1 refills | Status: DC
  Filled 2023-09-22: qty 45, 90d supply, fill #0

## 2023-09-23 ENCOUNTER — Ambulatory Visit: Payer: Self-pay | Admitting: Cardiology

## 2023-09-23 ENCOUNTER — Other Ambulatory Visit (HOSPITAL_COMMUNITY): Payer: Self-pay | Admitting: Internal Medicine

## 2023-09-23 ENCOUNTER — Other Ambulatory Visit: Payer: Self-pay

## 2023-09-23 ENCOUNTER — Encounter: Payer: Self-pay | Admitting: Pharmacist

## 2023-09-23 ENCOUNTER — Other Ambulatory Visit (HOSPITAL_COMMUNITY): Payer: Self-pay

## 2023-09-23 LAB — CUP PACEART REMOTE DEVICE CHECK
Battery Remaining Longevity: 60 mo
Battery Remaining Percentage: 55 %
Brady Statistic RV Percent Paced: 0 %
Date Time Interrogation Session: 20250917023100
HighPow Impedance: 52 Ohm
Implantable Lead Connection Status: 753985
Implantable Lead Implant Date: 20150601
Implantable Lead Location: 753860
Implantable Lead Model: 295
Implantable Lead Serial Number: 134196
Implantable Pulse Generator Implant Date: 20150601
Lead Channel Impedance Value: 409 Ohm
Lead Channel Pacing Threshold Amplitude: 1.4 V
Lead Channel Pacing Threshold Pulse Width: 0.6 ms
Lead Channel Setting Pacing Amplitude: 3 V
Lead Channel Setting Pacing Pulse Width: 0.6 ms
Lead Channel Setting Sensing Sensitivity: 0.6 mV
Pulse Gen Serial Number: 190689
Zone Setting Status: 755011

## 2023-09-24 ENCOUNTER — Other Ambulatory Visit (HOSPITAL_COMMUNITY): Payer: Self-pay

## 2023-09-28 ENCOUNTER — Encounter (HOSPITAL_COMMUNITY): Payer: Self-pay

## 2023-09-28 ENCOUNTER — Other Ambulatory Visit: Payer: Self-pay

## 2023-09-28 ENCOUNTER — Other Ambulatory Visit (HOSPITAL_COMMUNITY): Payer: Self-pay

## 2023-09-28 ENCOUNTER — Telehealth (HOSPITAL_COMMUNITY): Payer: Self-pay | Admitting: *Deleted

## 2023-09-28 MED ORDER — AMIODARONE HCL 200 MG PO TABS
200.0000 mg | ORAL_TABLET | Freq: Every day | ORAL | 5 refills | Status: AC
  Filled 2023-09-28: qty 30, 30d supply, fill #0
  Filled 2023-10-13: qty 30, 30d supply, fill #1

## 2023-09-28 MED ORDER — EPLERENONE 25 MG PO TABS
12.5000 mg | ORAL_TABLET | Freq: Every day | ORAL | 1 refills | Status: AC
  Filled 2023-09-28 – 2023-10-13 (×2): qty 15, 30d supply, fill #0
  Filled 2024-01-27: qty 15, 30d supply, fill #1

## 2023-09-28 NOTE — Progress Notes (Signed)
Remote ICD Transmission.

## 2023-09-28 NOTE — Addendum Note (Signed)
 Addended by: JERONA DALTON HERO on: 09/28/2023 12:19 PM   Modules accepted: Orders

## 2023-09-28 NOTE — Telephone Encounter (Signed)
 Attempted to call patient regarding upcoming cardiac PET appointment. Left message on voicemail with name and callback number  Larey Brick RN Navigator Cardiac Imaging [facility] [phone] Office [phone] Cell

## 2023-09-30 ENCOUNTER — Other Ambulatory Visit (HOSPITAL_COMMUNITY): Payer: Self-pay

## 2023-09-30 ENCOUNTER — Ambulatory Visit (HOSPITAL_COMMUNITY)
Admission: RE | Admit: 2023-09-30 | Discharge: 2023-09-30 | Disposition: A | Discharge status: Home / Self Care | Payer: Self-pay | Source: Ambulatory Visit | Attending: Physician Assistant | Admitting: Physician Assistant

## 2023-09-30 DIAGNOSIS — I5022 Chronic systolic (congestive) heart failure: Secondary | ICD-10-CM | POA: Insufficient documentation

## 2023-09-30 DIAGNOSIS — I700 Atherosclerosis of aorta: Secondary | ICD-10-CM | POA: Insufficient documentation

## 2023-09-30 DIAGNOSIS — I472 Ventricular tachycardia, unspecified: Secondary | ICD-10-CM | POA: Insufficient documentation

## 2023-09-30 MED ORDER — FLUDEOXYGLUCOSE F - 18 (FDG) INJECTION
8.9800 | Freq: Once | INTRAVENOUS | Status: AC
  Administered 2023-09-30: 8.98 via INTRAVENOUS

## 2023-09-30 MED ORDER — RUBIDIUM RB82 GENERATOR (RUBYFILL)
22.3100 | PACK | Freq: Once | INTRAVENOUS | Status: AC
  Administered 2023-09-30: 22.31 via INTRAVENOUS

## 2023-10-01 ENCOUNTER — Ambulatory Visit (HOSPITAL_COMMUNITY): Payer: Self-pay | Admitting: Internal Medicine

## 2023-10-01 LAB — NM PET CT MYOCARDIAL SARCOIDOSIS
Nuc Stress EF: 20 %
Rest Nuclear Isotope Dose: 22.3 mCi

## 2023-10-07 ENCOUNTER — Other Ambulatory Visit (HOSPITAL_COMMUNITY): Payer: Self-pay

## 2023-10-08 ENCOUNTER — Encounter: Payer: Self-pay | Admitting: Pharmacist

## 2023-10-08 ENCOUNTER — Other Ambulatory Visit: Payer: Self-pay

## 2023-10-11 ENCOUNTER — Other Ambulatory Visit (HOSPITAL_COMMUNITY): Payer: Self-pay

## 2023-10-11 NOTE — Telephone Encounter (Signed)
 Pt aware and voiced understanding  Reports this is his 3rd failed study  Does not wish to reschedule at this time would like to discuss at upcoming appt with provider

## 2023-10-13 ENCOUNTER — Other Ambulatory Visit: Payer: Self-pay | Admitting: Nurse Practitioner

## 2023-10-13 ENCOUNTER — Other Ambulatory Visit (HOSPITAL_COMMUNITY): Payer: Self-pay

## 2023-10-13 ENCOUNTER — Other Ambulatory Visit: Payer: Self-pay

## 2023-10-13 DIAGNOSIS — I10 Essential (primary) hypertension: Secondary | ICD-10-CM

## 2023-10-13 MED ORDER — TRAZODONE HCL 150 MG PO TABS
150.0000 mg | ORAL_TABLET | Freq: Every day | ORAL | 0 refills | Status: DC
  Filled 2023-10-13: qty 30, 30d supply, fill #0

## 2023-10-18 ENCOUNTER — Ambulatory Visit: Payer: Self-pay

## 2023-10-18 NOTE — Progress Notes (Deleted)
 New Patient Pulmonology Office Visit   Subjective:  Patient ID: Stanley [name], male    DOB: 07/02/1965  MRN: [id number]  Referred by: Glena Harlene HERO, FNP  CC: No chief complaint on file.   HPI [name] is a 58 y.o. 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  OSA history: ***  Symptoms: ***  Mouth breather: *** Preferred sleeping position: ***  Sleep related Symptoms:  Snoring- *** Witnessed apnea- *** Gasping/choking- *** morning HA/dry mouth- *** tired on awakening, excessive daytime sleepiness- *** Restless legs- *** Hypnogogic hallucination- *** sleep paralysis- *** sleep walking- *** sleep talking- *** night terrors/mares- *** dream enactment- ***  Sleep routine:  -Bed: *** -Nocturnal awakenings: *** -Wake: *** -Napping:*** -perceived sleep time: *** -sleep hygiene: ***  Social Hist/Habits:  -Caffeine : *** -Alcohol: *** -Nicotine:*** -Occupation: ***  PAP download compliance data:  Encore/Airview Pressure: Hours of usage: Days used >4hr: Leak: AHI:  PRIOR TESTS and IMAGING: PSG/HSAT: ***  ECHO: February 2025: EF 25 and 30%, mild LVH, grade 1 diastolic dysfunction, mild to moderate MR.  CPET 09/2023: Interpretation pending.      No data to display          Allergies: Entresto  [sacubitril -valsartan ], Lisinopril, Other, Shellfish allergy, Allopurinol , and Spironolactone   Current Outpatient Medications:    allopurinol  (ZYLOPRIM ) 300 MG tablet, Take 1 tablet (300 mg total) by mouth daily., Disp: 90 tablet, Rfl: 3   amiodarone  (PACERONE ) 200 MG tablet, Take 200 mg by mouth daily., Disp: , Rfl:    amiodarone  (PACERONE ) 200 MG tablet, Take 1 tablet (200 mg total) by mouth daily., Disp: 30 tablet, Rfl: 5   aspirin  EC 81 MG tablet, Take 81 mg by mouth daily. Swallow whole., Disp: , Rfl:    atorvastatin  (LIPITOR) 20 MG  tablet, Take 1 tablet (20 mg total) by mouth daily., Disp: 30 tablet, Rfl: 6   empagliflozin  (JARDIANCE ) 10 MG TABS tablet, Take 1 tablet (10 mg total) by mouth daily., Disp: 90 tablet, Rfl: 3   eplerenone  (INSPRA ) 25 MG tablet, Take 1 tablet (25 mg total) by mouth daily., Disp: 90 tablet, Rfl: 3   eplerenone  (INSPRA ) 25 MG tablet, Take 0.5 tablets (12.5 mg total) by mouth daily., Disp: 45 tablet, Rfl: 1   furosemide  (LASIX ) 40 MG tablet, Take 2 tablets (80 mg total) by mouth in the morning AND 1 tablet (40 mg total) every evening., Disp: 270 tablet, Rfl: 3   losartan  (COZAAR ) 25 MG tablet, Take 1 tablet (25 mg total) by mouth daily., Disp: 30 tablet, Rfl: 6   potassium chloride  SA (KLOR-CON  M) 20 MEQ tablet, Take 2 tablets (40 mEq total) by mouth daily., Disp: 180 tablet, Rfl: 3   traZODone  (DESYREL ) 150 MG tablet, Take 1 tablet (150 mg total) by mouth at bedtime., Disp: 90 tablet, Rfl: 0 Past Medical History:  Diagnosis Date   Acute renal failure (ARF)    Acute respiratory failure with hypoxia (HCC) 12/29/2018   Alcohol abuse    Anxiety    CAD (coronary artery disease)    a. dx unclear, reported PCI in 2011 but cath 2014 normal coronaries and cardiac CT 07/2016 normal coronaries, no mention of stents.   Chronic chest pain    Chronic systolic CHF (congestive heart failure) (HCC)    Depression    Dyspnea    07/05/2019: per patient some times with exertion, has to catch breath   Family  history of breast cancer 05/30/2021   Family history of pancreatic cancer 05/30/2021   Financial difficulties    Gout    Gouty arthritis    Headache    History of noncompliance with medical treatment    financial challenges   Hypertension    ICD (implantable cardioverter-defibrillator) in place    [employer] ICD implant done in [place] 2015   Insomnia    Lobar pneumonia 12/28/2018   Myocardial infarction (HCC) 2005   Nonischemic cardiomyopathy (HCC)    Sleep apnea    per patient, has CPAP does not wear it  every night   Ventricular tachycardia (HCC) 01/2015   2017 - ATP delivered for sinus tach, degenerated into VT for which he received shock. Recurrent VT 03/2018.   Past Surgical History:  Procedure Laterality Date   BAROREFLEX SYSTEM INSERTION     BIOPSY  03/06/2021   Procedure: BIOPSY;  Surgeon: Leigh Elspeth SQUIBB, MD;  Location: [facility];  Service: Gastroenterology;;   CARDIAC DEFIBRILLATOR PLACEMENT  06/05/2013   [employer] Inogen ICD implanted at [facility] in [place] for primary prevention   COLONOSCOPY WITH PROPOFOL  N/A 03/06/2021   Procedure: COLONOSCOPY WITH PROPOFOL ;  Surgeon: Leigh Elspeth SQUIBB, MD;  Location: [facility];  Service: Gastroenterology;  Laterality: N/A;   ESOPHAGEAL DILATION  03/06/2021   Procedure: ESOPHAGEAL DILATION;  Surgeon: Leigh Elspeth SQUIBB, MD;  Location: [facility];  Service: Gastroenterology;;   ESOPHAGOGASTRODUODENOSCOPY (EGD) WITH PROPOFOL  N/A 03/06/2021   Procedure: ESOPHAGOGASTRODUODENOSCOPY (EGD) WITH PROPOFOL ;  Surgeon: Leigh Elspeth SQUIBB, MD;  Location: [facility];  Service: Gastroenterology;  Laterality: N/A;   ESOPHAGOGASTRODUODENOSCOPY (EGD) WITH PROPOFOL  N/A 05/18/2021   Procedure: ESOPHAGOGASTRODUODENOSCOPY (EGD) WITH PROPOFOL ;  Surgeon: Legrand Victory LITTIE DOUGLAS, MD;  Location: [facility] ENDOSCOPY;  Service: Gastroenterology;  Laterality: N/A;   HERNIA REPAIR     PERCUTANEOUS CORONARY STENT INTERVENTION (PCI-S)  01/05/2009   POLYPECTOMY  03/06/2021   Procedure: POLYPECTOMY;  Surgeon: Leigh Elspeth SQUIBB, MD;  Location: [facility];  Service: Gastroenterology;;   RIGHT HEART CATH N/A 06/11/2022   Procedure: RIGHT HEART CATH;  Surgeon: Rolan Ezra RAMAN, MD;  Location: [facility] INVASIVE CV LAB;  Service: Cardiovascular;  Laterality: N/A;   Family History  Problem Relation Age of Onset   Breast cancer Mother    Pancreatic cancer Mother    Hypertension Father    Alzheimer's disease Father    Gout Father    Dementia Father     Hypertension Sister    Clotting disorder Sister    Obesity Brother    Bronchitis Brother    Cancer Maternal Uncle        unknown type; dx after 27; x2 maternal uncles   Breast cancer Paternal Aunt        dx after 72   Lung cancer Paternal Uncle    Heart disease Maternal Grandmother    Gout Paternal Grandfather    Colon polyps Neg Hx    Colon cancer Neg Hx    Esophageal cancer Neg Hx    Stomach cancer Neg Hx    Social History   Socioeconomic History   Marital status: Divorced    Spouse name: Not on file   Number of children: 1   Years of education: 12   Highest education level: Not on file  Occupational History   Occupation: Disability  Tobacco Use   Smoking status: Never   Smokeless tobacco: Never  Vaping Use   Vaping status: Never Used  Substance and Sexual  Activity   Alcohol use: Not Currently    Comment: Last drink before 06/08/2022   Drug use: No   Sexual activity: Not Currently  Other Topics Concern   Not on file  Social History Narrative   Lives in [place] alone.  Disabled.  Previously worked as a Geophysicist/field seismologist.   Fun: Rest, Read and watch movies.    Social Drivers of Health   Financial Resource Strain: High Risk (08/27/2023)   Overall Financial Resource Strain (CARDIA)    Difficulty of Paying Living Expenses: Hard  Food Insecurity: No Food Insecurity (06/24/2023)   Hunger Vital Sign    Worried About Running Out of Food in the Last Year: Never true    Ran Out of Food in the Last Year: Never true  Transportation Needs: Unmet Transportation Needs (09/08/2023)   PRAPARE - Administrator, Civil Service (Medical): Yes    Lack of Transportation (Non-Medical): Yes  Physical Activity: Unknown (03/21/2018)   Exercise Vital Sign    Days of Exercise per Week: Patient declined    Minutes of Exercise per Session: Patient declined  Stress: Stress Concern Present (03/21/2018)   [employer] of Occupational Health - Occupational Stress  Questionnaire    Feeling of Stress : To some extent  Social Connections: Patient Declined (06/24/2023)   Social Connection and Isolation Panel    Frequency of Communication with Friends and Family: Patient declined    Frequency of Social Gatherings with Friends and Family: Patient declined    Attends Religious Services: Patient declined    Database administrator or Organizations: Patient declined    Attends Banker Meetings: Patient declined    Marital Status: Patient declined  Intimate Partner Violence: Not At Risk (06/24/2023)   Humiliation, Afraid, Rape, and Kick questionnaire    Fear of Current or Ex-Partner: No    Emotionally Abused: No    Physically Abused: No    Sexually Abused: No       Objective:  There were no vitals taken for this visit. {Pulm Vitals (Optional):32837}  Physical Exam: CONSTITUTIONAL: NAD, well-appearing NASAL/OROPHARYNX:  Normal mucosa. No septal deviation. No hypertrophy of inferior turbinates. Modified Mallampati score ***. Tonsillar grade XXX.  CV: RRR s1s2 nl, no murmurs  RESP: Clear to auscultation, normal respiratory effort   NEURO: CN II/XII grossly intact PSYCH: Alert & oriented x 3, Euthymic, appropriate affect  Diagnostic Review:  {Labs (Optional):32838}       Assessment & Plan:   Assessment & Plan    He/She was counselled about not driving while drowsy which is common side effect of sleep related disorders.   No follow-ups on file.   Time spent: ***  Sammi Fredericks, MD

## 2023-10-27 ENCOUNTER — Telehealth (HOSPITAL_COMMUNITY): Payer: Self-pay | Admitting: Vascular Surgery

## 2023-10-27 NOTE — Telephone Encounter (Signed)
 Lvm to make f/u w. Db in NOV

## 2023-10-29 ENCOUNTER — Telehealth (HOSPITAL_COMMUNITY): Payer: Self-pay | Admitting: Internal Medicine

## 2023-11-08 ENCOUNTER — Other Ambulatory Visit: Payer: Self-pay | Admitting: Nurse Practitioner

## 2023-11-08 DIAGNOSIS — I10 Essential (primary) hypertension: Secondary | ICD-10-CM

## 2023-11-12 ENCOUNTER — Ambulatory Visit: Payer: Self-pay | Attending: Student | Admitting: Student

## 2023-11-12 NOTE — Progress Notes (Deleted)
  Cardiology Office Note:   Date:  11/12/2023  ID:  Stanley [name], DOB 07/12/1965, MRN [id number]  Primary Cardiologist: Toribio Fuel, MD Electrophysiologist: Soyla Gladis Norton, MD   History of Present Illness:   Stanley [name] is a 58 y.o. male with h/o HF due to NICM, ETOH abuse, h/o VT, h/o non-compliance, atypical chest pain, depression, and HTN. Pt also reportedly has CAD with PCI to unknown vessel 2011, but cath 2014 without CAD and normal cors by CT 07/2016 seen today for routine electrophysiology followup.   Admitted 06/2023 with ICD shock in setting of volume overload and questionable med compliance.   Since last being seen in our clinic the patient reports doing ***.  he denies chest pain, palpitations, dyspnea, PND, orthopnea, nausea, vomiting, dizziness, syncope, edema, weight gain, or early satiety.     Review of systems complete and found to be negative unless listed in HPI.    EP Information / Studies Reviewed:    EKG is not ordered today. EKG from 06/25/2023 reviewed which showed NSR with PVCs at 66 bpm   Arrhythmia/Device History Barostim (standard) implanted 07/07/2019 for Chronic systolic CHF [employer] single chamber ICD 06/05/2013 for chronic systolic CHF (GORE lead)   Barostim Interrogation- Performed personally and reviewed in detail today,  See scanned report  ICD/PPM interrogation - Performed personally and reviewed in detail today.    Physical Exam:   VS:  There were no vitals taken for this visit.   Wt Readings from Last 3 Encounters:  09/01/23 190 lb 12.8 oz (86.5 kg)  08/27/23 189 lb 2 oz (85.8 kg)  07/26/23 188 lb 9.6 oz (85.5 kg)     GEN: Well nourished, well developed in no acute distress NECK: No JVD; No carotid bruits CARDIAC: {EPRHYTHM:28826}, no murmurs, rubs, gallops RESPIRATORY:  Clear to auscultation without rales, wheezing or rhonchi  ABDOMEN: Soft, non-tender, non-distended EXTREMITIES:  {EDEMA LEVEL:28147::No} edema;  No deformity   ASSESSMENT AND PLAN:    Chronic systolic CHF s/p Chief Financial Officer and Barostim implantation NYHA *** symptoms. ***  Device {Blank single:19197::titrated from *** millamp to *** milliamp by increments of 0.4 with good blood pressure response and no stimulation.,programmed at *** for chronic settings}  Device impedence stable. Pt goals are to ***  Normal device function See scanned report. Will follow up in *** weeks to continue titration with goal of 6-8 milliamps for chronic settings.   Chronic systolic CHF   CAD Diagnosis unclear, reported PCI in 2011 but cath 2014 normal coronaries and cardiac CT 07/2016 normal coronaries, no mention of stents.  No s/s of ischemia.       Disposition:   Follow up with {EPPROVIDERS:28135::EP Team} in {EPFOLLOW UP:28173}  Signed, Ozell Prentice Passey, PA-C

## 2023-11-21 NOTE — Progress Notes (Unsigned)
  Cardiology Office Note:   Date:  11/22/2023  ID:  Liron Eissler, DOB 02/23/1965, MRN [id number]  Primary Cardiologist: Toribio Fuel, MD Electrophysiologist: Soyla Gladis Norton, MD   History of Present Illness:   Rudolpho Claxton is a 58 y.o. male with h/o HF due to NICM, ETOH abuse, h/o VT, h/o non-compliance, atypical chest pain, depression, and HTN. Pt also reportedly has CAD with PCI to unknown vessel 2011, but cath 2014 without CAD and normal cors by CT 07/2016 seen today for routine electrophysiology followup.   Admitted 06/2023 with ICD shock in setting of volume overload and questionable med compliance.   CPx 09/24/2023 with moderate to severe limitation from HF as well as chronotropic incompetence.  Since last being seen in our clinic the patient reports doing about the same. No further ICD shocks. Continues to have mild orthopnea and SOB with exertion. Denies exertional chest pain. Has long history of atypical chest pain. he denies chest pain, palpitations, dyspnea, PND, orthopnea, nausea, vomiting, dizziness, syncope, edema, weight gain, or early satiety.     Review of systems complete and found to be negative unless listed in HPI.    EP Information / Studies Reviewed:    EKG is not ordered today. EKG from 06/25/2023 reviewed which showed NSR with PVCs at 66 bpm with chronic LBBB   Arrhythmia/Device History Barostim (standard) implanted 07/07/2019 for Chronic systolic CHF [employer] single chamber ICD 06/05/2013 for chronic systolic CHF (GORE lead) WC   Barostim Interrogation- Performed personally and reviewed in detail today,  See scanned report  ICD/PPM interrogation - Performed personally and reviewed in detail today.    Physical Exam:   VS:  BP 130/68   Pulse (!) 55   Ht 5' 5 (1.651 m)   Wt 194 lb 14.4 oz (88.4 kg)   SpO2 97%   BMI 32.43 kg/m    Wt Readings from Last 3 Encounters:  11/22/23 194 lb 14.4 oz (88.4 kg)  09/01/23 190 lb 12.8 oz (86.5 kg)   08/27/23 189 lb 2 oz (85.8 kg)     GEN: Well nourished, well developed in no acute distress NECK: No JVD; No carotid bruits CARDIAC: Regular rate and rhythm, no murmurs, rubs, gallops RESPIRATORY:  Clear to auscultation without rales, wheezing or rhonchi  ABDOMEN: Soft, non-tender, non-distended EXTREMITIES:  No edema; No deformity   ASSESSMENT AND PLAN:    Chronic systolic CHF s/p Chief Financial Officer and Barostim implantation NYHA III symptoms.   Device programmed at 5.8 for chronic settings  Device impedence stable. Pt goals are to improve his functional status.  Normal device function See scanned report.  Pt has continued HF symptoms despite GDMT and Barostim, and has developed a Left bundle. CPx testing also with mod/severe HF limitation with significant chronotropic incompetence.   Explained risks, benefits, and alternatives to ICD upgrade, including but not limited to bleeding, infection, pneumothorax, pericardial effusion, lead dislodgement, heart attack, stroke, or death.  Pt verbalized understanding and agrees to proceed.    Not on [facility]    CAD Diagnosis unclear, reported PCI in 2011 but cath 2014 normal coronaries and cardiac CT 07/2016 normal coronaries, no mention of stents.  No s/s of ischemia.       Disposition:   Follow up with EP Team in as usual post procedure  Signed, Ozell Prentice Passey, PA-C

## 2023-11-22 ENCOUNTER — Ambulatory Visit: Payer: Self-pay | Attending: Student | Admitting: Student

## 2023-11-22 ENCOUNTER — Encounter: Payer: Self-pay | Admitting: Student

## 2023-11-22 VITALS — BP 130/68 | HR 55 | Ht 65.0 in | Wt 194.9 lb

## 2023-11-22 DIAGNOSIS — I5022 Chronic systolic (congestive) heart failure: Secondary | ICD-10-CM

## 2023-11-22 DIAGNOSIS — I2510 Atherosclerotic heart disease of native coronary artery without angina pectoris: Secondary | ICD-10-CM

## 2023-11-22 DIAGNOSIS — Z79899 Other long term (current) drug therapy: Secondary | ICD-10-CM

## 2023-11-22 DIAGNOSIS — Z9189 Other specified personal risk factors, not elsewhere classified: Secondary | ICD-10-CM

## 2023-11-22 DIAGNOSIS — I472 Ventricular tachycardia, unspecified: Secondary | ICD-10-CM

## 2023-11-22 LAB — CUP PACEART INCLINIC DEVICE CHECK
Date Time Interrogation Session: 20251117090520
HighPow Impedance: 44 Ohm
HighPow Impedance: 59 Ohm
Implantable Lead Connection Status: 753985
Implantable Lead Implant Date: 20150601
Implantable Lead Location: 753860
Implantable Lead Model: 295
Implantable Lead Serial Number: 134196
Implantable Pulse Generator Implant Date: 20150601
Lead Channel Impedance Value: 407 Ohm
Lead Channel Pacing Threshold Amplitude: 1.6 V
Lead Channel Pacing Threshold Pulse Width: 0.6 ms
Lead Channel Sensing Intrinsic Amplitude: 8.7 mV
Lead Channel Setting Pacing Amplitude: 3 V
Lead Channel Setting Pacing Pulse Width: 0.6 ms
Lead Channel Setting Sensing Sensitivity: 0.6 mV
Pulse Gen Serial Number: 190689
Zone Setting Status: 755011

## 2023-11-22 NOTE — Patient Instructions (Addendum)
 Medication Instructions:  Your physician recommends that you continue on your current medications as directed. Please refer to the Current Medication list given to you today.  *If you need a refill on your cardiac medications before your next appointment, please call your pharmacy*  Lab Work: CBC, CMET, TSH, FreeT4-See letter for when to have these drawn If you have labs (blood work) drawn today and your tests are completely normal, you will receive your results only by: MyChart Message (if you have MyChart) OR A paper copy in the mail If you have any lab test that is abnormal or we need to change your treatment, we will call you to review the results.  Testing/Procedures: See letter  Follow-Up: At [facility], you and your health needs are our priority.  As part of our continuing mission to provide you with exceptional heart care, our providers are all part of one team.  This team includes your primary Cardiologist (physician) and Advanced Practice Providers or APPs (Physician Assistants and Nurse Practitioners) who all work together to provide you with the care you need, when you need it.  Your next appointment:   Follow up will be arranged for you and print out on your discharge summary after your procedure.

## 2023-11-24 ENCOUNTER — Ambulatory Visit: Payer: Self-pay | Admitting: Cardiology

## 2023-11-25 ENCOUNTER — Ambulatory Visit (HOSPITAL_COMMUNITY)
Admission: EM | Admit: 2023-11-25 | Discharge: 2023-11-25 | Disposition: A | Discharge status: Home / Self Care | Payer: Self-pay | Attending: Internal Medicine | Admitting: Internal Medicine

## 2023-11-25 ENCOUNTER — Encounter (HOSPITAL_COMMUNITY): Payer: Self-pay | Admitting: Internal Medicine

## 2023-11-25 DIAGNOSIS — H9201 Otalgia, right ear: Secondary | ICD-10-CM

## 2023-11-25 DIAGNOSIS — H6121 Impacted cerumen, right ear: Secondary | ICD-10-CM

## 2023-11-25 MED ORDER — CIPROFLOXACIN-DEXAMETHASONE 0.3-0.1 % OT SUSP
4.0000 [drp] | Freq: Two times a day (BID) | OTIC | 0 refills | Status: AC
  Filled 2023-11-25: qty 7.5, 19d supply, fill #0

## 2023-11-25 NOTE — ED Triage Notes (Signed)
 Patient reports that he has had right ear pain x 2 months and worse in the past 2 weeks. Patient sates he is unable to hear out of the right ear.  Patient reports that he has not taken or used any medications for his ear pain.

## 2023-11-25 NOTE — ED Provider Notes (Signed)
 [facility]    CSN: [id number] Arrival date & time: 11/25/23  1643      History   Chief Complaint Chief Complaint  Patient presents with   Otalgia    HPI Stanley [name] is a 58 y.o. male.   58 year old male who presents urgent care with complaints of loss of hearing in the right ear and ear pain.  This started about 2 months ago and has progressively worsened.  He reports that in the last few days the pain has gotten much more severe.  He states that if he closes his left ear that he cannot hear anything.  He has had some problems with his right ear in the past.  He denies any fevers or chills.  He has not had any recent illnesses.   Otalgia Associated symptoms: hearing loss   Associated symptoms: no abdominal pain, no cough, no fever, no rash, no sore throat and no vomiting     Past Medical History:  Diagnosis Date   Acute renal failure (ARF)    Acute respiratory failure with hypoxia (HCC) 12/29/2018   Alcohol abuse    Anxiety    CAD (coronary artery disease)    a. dx unclear, reported PCI in 2011 but cath 2014 normal coronaries and cardiac CT 07/2016 normal coronaries, no mention of stents.   Chronic chest pain    Chronic systolic CHF (congestive heart failure) (HCC)    Depression    Dyspnea    07/05/2019: per patient some times with exertion, has to catch breath   Family history of breast cancer 05/30/2021   Family history of pancreatic cancer 05/30/2021   Financial difficulties    Gout    Gouty arthritis    Headache    History of noncompliance with medical treatment    financial challenges   Hypertension    ICD (implantable cardioverter-defibrillator) in place    [employer] ICD implant done in [place] 2015   Insomnia    Lobar pneumonia 12/28/2018   Myocardial infarction (HCC) 2005   Nonischemic cardiomyopathy (HCC)    Sleep apnea    per patient, has CPAP does not wear it every night   Ventricular tachycardia (HCC) 01/2015   2017 - ATP delivered  for sinus tach, degenerated into VT for which he received shock. Recurrent VT 03/2018.    Patient Active Problem List   Diagnosis Date Noted   Acute exacerbation of CHF (congestive heart failure) (HCC) 02/10/2023   Neutrophilic leukocytosis 07/31/2022   Alcoholic intoxication without complication 06/11/2022   Cardiogenic shock (HCC) 06/10/2022   Acute hypoxemic respiratory failure (HCC) 06/09/2022   Acute on chronic combined systolic and diastolic CHF (congestive heart failure) (HCC) 06/09/2022   AICD discharge 06/08/2022   Abdominal pain 06/08/2022   Anxiety and depression 06/08/2022   Genetic testing 06/13/2021   Family history of breast cancer 05/30/2021   Family history of pancreatic cancer 05/30/2021   Diverticulosis of colon without hemorrhage    Hematemesis 05/18/2021   ABLA (acute blood loss anemia) 05/18/2021   Benign neoplasm of colon    Abdominal pain, epigastric    Early satiety    Dysphagia    Gastroesophageal reflux disease 04/23/2020   Colon cancer screening 04/23/2020   CAD (coronary artery disease) 07/07/2019   Congestive heart failure (CHF) (HCC) 07/07/2019   Moderate episode of recurrent major depressive disorder (HCC) 01/11/2019   Chronic systolic heart failure (HCC)    Obstructive sleep apnea 08/11/2018   Implantable cardioverter-defibrillator (ICD) in  situ 03/21/2018   ETOH abuse 03/21/2018   AKI (acute kidney injury) 03/21/2018   History of noncompliance with medical treatment, presenting hazards to health 03/21/2018   Ventricular tachycardia (HCC) 03/21/2018   Gout 09/25/2015   Nonischemic cardiomyopathy (HCC) 07/03/2015   Chronic systolic dysfunction of left ventricle 07/03/2015   Essential hypertension 07/03/2015    Past Surgical History:  Procedure Laterality Date   BAROREFLEX SYSTEM INSERTION     BIOPSY  03/06/2021   Procedure: BIOPSY;  Surgeon: Leigh Elspeth SQUIBB, MD;  Location: [facility];  Service: Gastroenterology;;   CARDIAC  DEFIBRILLATOR PLACEMENT  06/05/2013   [employer] Inogen ICD implanted at [facility] in [place] for primary prevention   COLONOSCOPY WITH PROPOFOL  N/A 03/06/2021   Procedure: COLONOSCOPY WITH PROPOFOL ;  Surgeon: Leigh Elspeth SQUIBB, MD;  Location: [facility];  Service: Gastroenterology;  Laterality: N/A;   ESOPHAGEAL DILATION  03/06/2021   Procedure: ESOPHAGEAL DILATION;  Surgeon: Leigh Elspeth SQUIBB, MD;  Location: [facility];  Service: Gastroenterology;;   ESOPHAGOGASTRODUODENOSCOPY (EGD) WITH PROPOFOL  N/A 03/06/2021   Procedure: ESOPHAGOGASTRODUODENOSCOPY (EGD) WITH PROPOFOL ;  Surgeon: Leigh Elspeth SQUIBB, MD;  Location: [facility];  Service: Gastroenterology;  Laterality: N/A;   ESOPHAGOGASTRODUODENOSCOPY (EGD) WITH PROPOFOL  N/A 05/18/2021   Procedure: ESOPHAGOGASTRODUODENOSCOPY (EGD) WITH PROPOFOL ;  Surgeon: Legrand Victory LITTIE DOUGLAS, MD;  Location: [facility] END[facility]COPY;  Service: Gastroenterology;  Laterality: N/A;   HERNIA REPAIR     PERCUTANEOUS CORONARY STENT INTERVENTION (PCI-S)  01/05/2009   POLYPECTOMY  03/06/2021   Procedure: POLYPECTOMY;  Surgeon: Leigh Elspeth SQUIBB, MD;  Location: [facility];  Service: Gastroenterology;;   RIGHT HEART CATH N/A 06/11/2022   Procedure: RIGHT HEART CATH;  Surgeon: Rolan Ezra RAMAN, MD;  Location: [facility] INVASIVE CV LAB;  Service: Cardiovascular;  Laterality: N/A;       Home Medications    Prior to Admission medications   Medication Sig Start Date End Date Taking? Authorizing Provider  ciprofloxacin-dexamethasone  (CIPRODEX) OTIC suspension Place 4 drops into the right ear 2 (two) times daily for 5 days. 11/25/23 11/30/23 Yes Aiman Sonn A, PA-C  allopurinol  (ZYLOPRIM ) 300 MG tablet Take 1 tablet (300 mg total) by mouth daily. 07/26/23   Fleming, Zelda W, NP  amiodarone  (PACERONE ) 200 MG tablet Take 200 mg by mouth daily.    [provider]  amiodarone  (PACERONE ) 200 MG tablet Take 1 tablet (200 mg total) by mouth daily.  09/28/23   Bensimhon, Toribio SAUNDERS, MD  aspirin  EC 81 MG tablet Take 81 mg by mouth daily. Swallow whole.    [provider]  atorvastatin  (LIPITOR) 20 MG tablet Take 1 tablet (20 mg total) by mouth daily. 07/26/23   Fleming, Zelda W, NP  empagliflozin  (JARDIANCE ) 10 MG TABS tablet Take 1 tablet (10 mg total) by mouth daily. 03/05/23   Glena Harlene HERO, FNP  eplerenone  (INSPRA ) 25 MG tablet Take 1 tablet (25 mg total) by mouth daily. 03/31/23   Bensimhon, Toribio SAUNDERS, MD  eplerenone  (INSPRA ) 25 MG tablet Take 0.5 tablets (12.5 mg total) by mouth daily. 09/28/23   Bensimhon, Toribio SAUNDERS, MD  furosemide  (LASIX ) 40 MG tablet Take 2 tablets (80 mg total) by mouth in the morning AND 1 tablet (40 mg total) every evening. 09/01/23   Milford, Harlene HERO, FNP  losartan  (COZAAR ) 25 MG tablet Take 1 tablet (25 mg total) by mouth daily. 07/26/23 11/22/23  Fleming, Zelda W, NP  potassium chloride  SA (KLOR-CON  M) 20 MEQ tablet Take 2 tablets (40 mEq total) by mouth  daily. 09/01/23   Glena Harlene HERO, FNP  traZODone  (DESYREL ) 150 MG tablet Take 1 tablet (150 mg total) by mouth at bedtime. 10/13/23   Delbert Clam, MD    Family History Family History  Problem Relation Age of Onset   Breast cancer Mother    Pancreatic cancer Mother    Hypertension Father    Alzheimer's disease Father    Gout Father    Dementia Father    Hypertension Sister    Clotting disorder Sister    Obesity Brother    Bronchitis Brother    Cancer Maternal Uncle        unknown type; dx after 89; x2 maternal uncles   Breast cancer Paternal Aunt        dx after 50   Lung cancer Paternal Uncle    Heart disease Maternal Grandmother    Gout Paternal Grandfather    Colon polyps Neg Hx    Colon cancer Neg Hx    Esophageal cancer Neg Hx    Stomach cancer Neg Hx     Social History Social History   Tobacco Use   Smoking status: Never   Smokeless tobacco: Never  Vaping Use   Vaping status: Never Used  Substance Use Topics   Alcohol  use: Yes    Comment: Last drink before 06/08/2022   Drug use: No     Allergies   Entresto  [sacubitril -valsartan ], Lisinopril, Other, Shellfish allergy, Allopurinol , and Spironolactone    Review of Systems Review of Systems  Constitutional:  Negative for chills and fever.  HENT:  Positive for ear pain and hearing loss. Negative for sore throat.   Eyes:  Negative for pain and visual disturbance.  Respiratory:  Negative for cough and shortness of breath.   Cardiovascular:  Negative for chest pain and palpitations.  Gastrointestinal:  Negative for abdominal pain and vomiting.  Genitourinary:  Negative for dysuria and hematuria.  Musculoskeletal:  Negative for arthralgias and back pain.  Skin:  Negative for color change and rash.  Neurological:  Negative for seizures and syncope.  All other systems reviewed and are negative.    Physical Exam Triage Vital Signs ED Triage Vitals  Encounter Vitals Group     BP 11/25/23 1848 116/75     Girls Systolic BP Percentile --      Girls Diastolic BP Percentile --      Boys Systolic BP Percentile --      Boys Diastolic BP Percentile --      Pulse Rate 11/25/23 1848 64     Resp 11/25/23 1848 16     Temp 11/25/23 1848 98.4 F (36.9 C)     Temp Source 11/25/23 1848 Oral     SpO2 11/25/23 1848 96 %     Weight --      Height --      Head Circumference --      Peak Flow --      Pain Score 11/25/23 1847 8     Pain Loc --      Pain Education --      Exclude from Growth Chart --    No data found.  Updated Vital Signs BP 116/75 (BP Location: Left Arm)   Pulse 64   Temp 98.4 F (36.9 C) (Oral)   Resp 16   SpO2 96%   Visual Acuity Right Eye Distance:   Left Eye Distance:   Bilateral Distance:    Right Eye Near:   Left Eye Near:  Bilateral Near:     Physical Exam Vitals and nursing note reviewed.  Constitutional:      General: He is not in acute distress.    Appearance: He is well-developed.  HENT:     Head: Normocephalic  and atraumatic.     Right Ear: There is impacted cerumen.     Left Ear: Tympanic membrane normal.     Nose: Nose normal.  Eyes:     Conjunctiva/sclera: Conjunctivae normal.  Cardiovascular:     Rate and Rhythm: Normal rate and regular rhythm.     Heart sounds: No murmur heard. Pulmonary:     Effort: Pulmonary effort is normal. No respiratory distress.     Breath sounds: Normal breath sounds.  Abdominal:     Palpations: Abdomen is soft.     Tenderness: There is no abdominal tenderness.  Musculoskeletal:        General: No swelling.     Cervical back: Neck supple.  Skin:    General: Skin is warm and dry.     Capillary Refill: Capillary refill takes less than 2 seconds.  Neurological:     Mental Status: He is alert.  Psychiatric:        Mood and Affect: Mood normal.      UC Treatments / Results  Labs (all labs ordered are listed, but only abnormal results are displayed) Labs Reviewed - No data to display  EKG   Radiology No results found.  Procedures Procedures (including critical care time)  Medications Ordered in UC Medications - No data to display  Initial Impression / Assessment and Plan / UC Course  I have reviewed the triage vital signs and the nursing notes.  Pertinent labs & imaging results that were available during my care of the patient were reviewed by me and considered in my medical decision making (see chart for details).     Right ear pain  Impacted cerumen of right ear   Ear irrigation done on the right due to impacted cerumen (impacted earwax).  After irrigation there is still some remaining earwax but we are able to see the tympanic membrane (eardrum).  There does not appear to be an infection in the middle ear however the ear canal is very red.  Given this we will treat with an antibiotic eardrop as well as medication to help with the pain and swelling in the ear canal.  We recommend the following: Ciprodex 4 drops into the right ear 2 times  daily for 5 days Do not use Q-tips in the ear as this can force earwax deeper Can try over-the-counter Debrox to help with removing earwax.  Wait until you finish the antibiotic eardrops before trying this Return to urgent care or PCP if symptoms worsen or fail to resolve.    Final Clinical Impressions(s) / UC Diagnoses   Final diagnoses:  Right ear pain  Impacted cerumen of right ear     Discharge Instructions      Ear irrigation done on the right due to impacted cerumen (impacted earwax).  After irrigation there is still some remaining earwax but we are able to see the tympanic membrane (eardrum).  There does not appear to be an infection in the middle ear however the ear canal is very red.  Given this we will treat with an antibiotic eardrop as well as medication to help with the pain and swelling in the ear canal.  We recommend the following: Ciprodex 4 drops into the right ear 2  times daily for 5 days Do not use Q-tips in the ear as this can force earwax deeper Can try over-the-counter Debrox to help with removing earwax.  Wait until you finish the antibiotic eardrops before trying this Return to urgent care or PCP if symptoms worsen or fail to resolve.       ED Prescriptions     Medication Sig Dispense Auth. Provider   ciprofloxacin -dexamethasone  (CIPRODEX ) OTIC suspension Place 4 drops into the right ear 2 (two) times daily for 5 days. 7.5 mL Teresa Almarie LABOR, [place]      PDMP not reviewed this encounter.   Teresa Almarie LABOR, [place] 11/25/23 1944

## 2023-11-25 NOTE — Discharge Instructions (Addendum)
 Ear irrigation done on the right due to impacted cerumen (impacted earwax).  After irrigation there is still some remaining earwax but we are able to see the tympanic membrane (eardrum).  There does not appear to be an infection in the middle ear however the ear canal is very red.  Given this we will treat with an antibiotic eardrop as well as medication to help with the pain and swelling in the ear canal.  We recommend the following: Ciprodex 4 drops into the right ear 2 times daily for 5 days Do not use Q-tips in the ear as this can force earwax deeper Can try over-the-counter Debrox to help with removing earwax.  Wait until you finish the antibiotic eardrops before trying this Return to urgent care or PCP if symptoms worsen or fail to resolve.

## 2023-11-26 ENCOUNTER — Other Ambulatory Visit (HOSPITAL_COMMUNITY): Payer: Self-pay

## 2023-11-26 ENCOUNTER — Other Ambulatory Visit: Payer: Self-pay

## 2023-12-22 ENCOUNTER — Ambulatory Visit: Payer: Medicaid Other

## 2023-12-22 DIAGNOSIS — I5022 Chronic systolic (congestive) heart failure: Secondary | ICD-10-CM

## 2023-12-23 ENCOUNTER — Telehealth (HOSPITAL_COMMUNITY): Payer: Self-pay | Admitting: Internal Medicine

## 2023-12-23 LAB — CUP PACEART REMOTE DEVICE CHECK
Battery Remaining Longevity: 54 mo
Battery Remaining Percentage: 51 %
Brady Statistic RV Percent Paced: 0 %
Date Time Interrogation Session: 20251217023200
HighPow Impedance: 62 Ohm
Implantable Lead Connection Status: 753985
Implantable Lead Implant Date: 20150601
Implantable Lead Location: 753860
Implantable Lead Model: 295
Implantable Lead Serial Number: 134196
Implantable Pulse Generator Implant Date: 20150601
Lead Channel Impedance Value: 450 Ohm
Lead Channel Pacing Threshold Amplitude: 1.6 V
Lead Channel Pacing Threshold Pulse Width: 0.6 ms
Lead Channel Setting Pacing Amplitude: 3 V
Lead Channel Setting Pacing Pulse Width: 0.6 ms
Lead Channel Setting Sensing Sensitivity: 0.6 mV
Pulse Gen Serial Number: 190689
Zone Setting Status: 755011

## 2023-12-23 NOTE — Telephone Encounter (Signed)
 Called to confirm/remind patient of their appointment [facility] on 12/23/[facility].   Appointment:   [] Confirmed  [x] Left mess   [] No answer/No voice mail  [] VM Full/unable to leave message  [] Phone not in service  Patient reminded to bring all medications and/or complete list.  Confirmed patient has transportation. Gave directions, instructed to utilize valet parking.

## 2023-12-23 NOTE — Progress Notes (Signed)
 Remote ICD Transmission

## 2023-12-24 ENCOUNTER — Other Ambulatory Visit (HOSPITAL_COMMUNITY): Payer: Self-pay

## 2023-12-24 ENCOUNTER — Encounter (HOSPITAL_COMMUNITY): Payer: Self-pay | Admitting: Internal Medicine

## 2023-12-24 ENCOUNTER — Ambulatory Visit (HOSPITAL_COMMUNITY)
Admission: RE | Admit: 2023-12-24 | Discharge: 2023-12-24 | Disposition: A | Discharge status: Home / Self Care | Payer: Self-pay | Source: Ambulatory Visit | Attending: Internal Medicine | Admitting: Internal Medicine

## 2023-12-24 VITALS — BP 124/76 | HR 81 | Ht 65.0 in | Wt 193.6 lb

## 2023-12-24 DIAGNOSIS — I252 Old myocardial infarction: Secondary | ICD-10-CM | POA: Diagnosis not present

## 2023-12-24 DIAGNOSIS — Z955 Presence of coronary angioplasty implant and graft: Secondary | ICD-10-CM | POA: Diagnosis not present

## 2023-12-24 DIAGNOSIS — G4733 Obstructive sleep apnea (adult) (pediatric): Secondary | ICD-10-CM

## 2023-12-24 DIAGNOSIS — I472 Ventricular tachycardia, unspecified: Secondary | ICD-10-CM

## 2023-12-24 DIAGNOSIS — I519 Heart disease, unspecified: Secondary | ICD-10-CM

## 2023-12-24 DIAGNOSIS — I5022 Chronic systolic (congestive) heart failure: Secondary | ICD-10-CM | POA: Diagnosis not present

## 2023-12-24 DIAGNOSIS — Z7984 Long term (current) use of oral hypoglycemic drugs: Secondary | ICD-10-CM | POA: Diagnosis not present

## 2023-12-24 DIAGNOSIS — Z8249 Family history of ischemic heart disease and other diseases of the circulatory system: Secondary | ICD-10-CM | POA: Diagnosis not present

## 2023-12-24 DIAGNOSIS — I130 Hypertensive heart and chronic kidney disease with heart failure and stage 1 through stage 4 chronic kidney disease, or unspecified chronic kidney disease: Secondary | ICD-10-CM | POA: Diagnosis not present

## 2023-12-24 DIAGNOSIS — F1010 Alcohol abuse, uncomplicated: Secondary | ICD-10-CM

## 2023-12-24 DIAGNOSIS — I2510 Atherosclerotic heart disease of native coronary artery without angina pectoris: Secondary | ICD-10-CM | POA: Diagnosis not present

## 2023-12-24 DIAGNOSIS — Z95810 Presence of automatic (implantable) cardiac defibrillator: Secondary | ICD-10-CM | POA: Diagnosis not present

## 2023-12-24 DIAGNOSIS — Z79899 Other long term (current) drug therapy: Secondary | ICD-10-CM | POA: Insufficient documentation

## 2023-12-24 DIAGNOSIS — I110 Hypertensive heart disease with heart failure: Secondary | ICD-10-CM | POA: Diagnosis present

## 2023-12-24 DIAGNOSIS — Z7982 Long term (current) use of aspirin: Secondary | ICD-10-CM | POA: Diagnosis not present

## 2023-12-24 DIAGNOSIS — N1831 Chronic kidney disease, stage 3a: Secondary | ICD-10-CM | POA: Diagnosis not present

## 2023-12-24 DIAGNOSIS — I5043 Acute on chronic combined systolic (congestive) and diastolic (congestive) heart failure: Secondary | ICD-10-CM

## 2023-12-24 LAB — BASIC METABOLIC PANEL WITH GFR
Anion gap: 16 — ABNORMAL HIGH (ref 5–15)
BUN: 39 mg/dL — ABNORMAL HIGH (ref 6–20)
CO2: 20 mmol/L — ABNORMAL LOW (ref 22–32)
Calcium: 9.1 mg/dL (ref 8.9–10.3)
Chloride: 99 mmol/L (ref 98–111)
Creatinine, Ser: 2.07 mg/dL — ABNORMAL HIGH (ref 0.61–1.24)
GFR, Estimated: 36 mL/min — ABNORMAL LOW
Glucose, Bld: 109 mg/dL — ABNORMAL HIGH (ref 70–99)
Potassium: 3.9 mmol/L (ref 3.5–5.1)
Sodium: 135 mmol/L (ref 135–145)

## 2023-12-24 LAB — PRO BRAIN NATRIURETIC PEPTIDE: Pro Brain Natriuretic Peptide: 449 pg/mL — ABNORMAL HIGH

## 2023-12-24 MED ORDER — LOSARTAN POTASSIUM 50 MG PO TABS
50.0000 mg | ORAL_TABLET | Freq: Every day | ORAL | 6 refills | Status: AC
  Filled 2023-12-24: qty 30, 30d supply, fill #0

## 2023-12-24 NOTE — Progress Notes (Signed)
 "  [facility]  HF Cardiologist: Dr. Cherrie EP: Dr. Inocencio  HPI: Stanley [name] is a 58 y.o. male with systolic HF due to NICM d/x'd in 2006 (felt 2/2 HTN/ETOH), ? CAD s/p PCI to unknown vessel 2011 at outside location (cath 2014 without CAD and normal coronaries by CT 07/2016), VT s/p [facility] ICD (2015), noncompliance, chronic noncardiac chest pain, depression, HTN and ETOH abuse.     In 2017 he received inappropriate therapy for ST with ATP (after a work out at [employer] and reported noncompliance with meds) that degenerated into VT and received several shocks.    Recurrent ICD shock in 3/20 in setting of medicine noncompliance. Amio continued. Echo 3/20 EF 25-30% Normal RV.    S/p Barostim 7/21.   Admitted 6/24 with recurrent VT 2/2 intoxication and metabolic derangement. Echo showed EF < 20%, RV mildly reduced. Required pressors and ICU monitoring due to worsening respiratory failure and ETOH withdrawal.  RHC showed elevated RA pressures, normal PCWP, mild to moderate PAH and decreased output with CI (Thermo) 1.7.    Admitted 2/25 with a/c HF & CAP. Echo showed EF 25-30%, G1DD, normal RV, mild to moderate MR.   He had VT and received ATP followed ICD shock on 06/19. Patient was called by EP and sent to the ED for admission. He appeared compensated from HF standpoint. He was reloaded with IV amiodarone  then converted to po load. 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 cMRI attempted but not compatible with Barostim device.  Had cardiac PET 9/25 EF 20% diffuse uptake felt to be related to poor dietary prep.   CPX 9/25: Spirometry with moderate restriction. pVO2 13.8 (52%) adj to ibw pVO2 17.0  slope 34 pRER 1.10   Today he returns for HF follow up. Over past several months. More fatigued. Dyspneic with mild activity. No edema, orthopnea or PND. No problems with meds. Drinking 2 glasses of wine on the weekend     ROS: All systems negative except as listed in HPI, PMH and Problem List.  SH:  Social History   Socioeconomic History   Marital status: Divorced    Spouse name: Not on file   Number of children: 1   Years of education: 12   Highest education level: Not on file  Occupational History   Occupation: Disability  Tobacco Use   Smoking status: Never   Smokeless tobacco: Never  Vaping Use   Vaping status: Never Used  Substance and Sexual Activity   Alcohol use: Yes    Comment: Last drink before 06/08/2022   Drug use: No   Sexual activity: Not Currently  Other Topics Concern   Not on file  Social History Narrative   Lives in [place] alone.  Disabled.  Previously worked as a geophysicist/field seismologist.   Fun: Rest, Read and watch movies.    Social Drivers of Health   Tobacco Use: Low Risk (12/24/2023)   Patient History    Smoking Tobacco Use: Never    Smokeless Tobacco Use: Never    Passive Exposure: Not on file  Financial Resource Strain: High Risk (08/27/2023)   Overall Financial Resource Strain (CARDIA)    Difficulty of Paying Living Expenses: Hard  Food Insecurity: No Food Insecurity (06/24/2023)   Epic    Worried About Programme Researcher, Broadcasting/film/video in the Last Year: Never true    Ran Out of Food in the Last Year: Never true  Transportation  Needs: Unmet Transportation Needs (09/08/2023)   Epic    Lack of Transportation (Medical): Yes    Lack of Transportation (Non-Medical): Yes  Physical Activity: Not on file  Stress: Not on file  Social Connections: Patient Declined (06/24/2023)   Social Connection and Isolation Panel    Frequency of Communication with Friends and Family: Patient declined    Frequency of Social Gatherings with Friends and Family: Patient declined    Attends Religious Services: Patient declined    Active Member of Clubs or Organizations: Patient declined    Attends Banker Meetings: Patient declined    Marital Status: Patient declined  Intimate  Partner Violence: Not At Risk (06/24/2023)   Epic    Fear of Current or Ex-Partner: No    Emotionally Abused: No    Physically Abused: No    Sexually Abused: No  Depression (PHQ2-9): High Risk (07/26/2023)   Depression (PHQ2-9)    PHQ-2 Score: 13  Alcohol Screen: Not on file  Housing: Unknown (06/24/2023)   Epic    Unable to Pay for Housing in the Last Year: No    Number of Times Moved in the Last Year: 0    Homeless in the Last Year: Not on file  Utilities: Not At Risk (06/24/2023)   Epic    Threatened with loss of utilities: No  Health Literacy: Not on file    FH:  Family History  Problem Relation Age of Onset   Breast cancer Mother    Pancreatic cancer Mother    Hypertension Father    Alzheimer's disease Father    Gout Father    Dementia Father    Hypertension Sister    Clotting disorder Sister    Obesity Brother    Bronchitis Brother    Cancer Maternal Uncle        unknown type; dx after 2; x2 maternal uncles   Breast cancer Paternal Aunt        dx after 50   Lung cancer Paternal Uncle    Heart disease Maternal Grandmother    Gout Paternal Grandfather    Colon polyps Neg Hx    Colon cancer Neg Hx    Esophageal cancer Neg Hx    Stomach cancer Neg Hx     Past Medical History:  Diagnosis Date   Acute renal failure (ARF)    Acute respiratory failure with hypoxia (HCC) 12/29/2018   Alcohol abuse    Anxiety    CAD (coronary artery disease)    a. dx unclear, reported PCI in 2011 but cath 2014 normal coronaries and cardiac CT 07/2016 normal coronaries, no mention of stents.   Chronic chest pain    Chronic systolic CHF (congestive heart failure) (HCC)    Depression    Dyspnea    07/05/2019: per patient some times with exertion, has to catch breath   Family history of breast cancer 05/30/2021   Family history of pancreatic cancer 05/30/2021   Financial difficulties    Gout    Gouty arthritis    Headache    History of noncompliance with medical treatment     financial challenges   Hypertension    ICD (implantable cardioverter-defibrillator) in place    [place] [employer] ICD implant done in [place] 2015   Insomnia    Lobar pneumonia 12/28/2018   Myocardial infarction (HCC) 2005   Nonischemic cardiomyopathy (HCC)    Sleep apnea    per patient, has CPAP does not wear it every night  Ventricular tachycardia (HCC) 01/2015   2017 - ATP delivered for sinus tach, degenerated into VT for which he received shock. Recurrent VT 03/2018.    Current Outpatient Medications  Medication Sig Dispense Refill   allopurinol  (ZYLOPRIM ) 300 MG tablet Take 1 tablet (300 mg total) by mouth daily. 90 tablet 3   amiodarone  (PACERONE ) 200 MG tablet Take 1 tablet (200 mg total) by mouth daily. 30 tablet 5   aspirin  EC 81 MG tablet Take 81 mg by mouth daily. Swallow whole.     atorvastatin  (LIPITOR) 20 MG tablet Take 1 tablet (20 mg total) by mouth daily. 30 tablet 6   empagliflozin  (JARDIANCE ) 10 MG TABS tablet Take 1 tablet (10 mg total) by mouth daily. 90 tablet 3   eplerenone  (INSPRA ) 25 MG tablet Take 1 tablet (25 mg total) by mouth daily. 90 tablet 3   eplerenone  (INSPRA ) 25 MG tablet Take 0.5 tablets (12.5 mg total) by mouth daily. 45 tablet 1   furosemide  (LASIX ) 40 MG tablet Take 2 tablets (80 mg total) by mouth in the morning AND 1 tablet (40 mg total) every evening. 270 tablet 3   losartan  (COZAAR ) 25 MG tablet Take 1 tablet (25 mg total) by mouth daily. 30 tablet 6   potassium chloride  SA (KLOR-CON  M) 20 MEQ tablet Take 2 tablets (40 mEq total) by mouth daily. 180 tablet 3   amiodarone  (PACERONE ) 200 MG tablet Take 200 mg by mouth daily. (Patient not taking: Reported on 12/24/2023)     traZODone  (DESYREL ) 150 MG tablet Take 1 tablet (150 mg total) by mouth at bedtime. (Patient not taking: Reported on 12/24/2023) 90 tablet 0   No current facility-administered medications for this encounter.   Wt Readings from Last 3 Encounters:  12/24/23 87.8 kg (193 lb 9.6 oz)   11/22/23 88.4 kg (194 lb 14.4 oz)  09/01/23 86.5 kg (190 lb 12.8 oz)   BP 124/76   Pulse 81   Ht 5' 5 (1.651 m)   Wt 87.8 kg (193 lb 9.6 oz)   SpO2 97%   BMI 32.22 kg/m   PHYSICAL EXAM: General:  NAD. No resp difficulty, walked into clinic HEENT: Normal Neck: Supple. JVP 10-12 Cor: Regular rate & rhythm. No rubs, gallops or murmurs. Lungs: Clear Abdomen: obese Soft, nontender, nondistended.  Extremities: No cyanosis, clubbing, rash, edema Neuro: Alert & oriented x 3, moves all 4 extremities w/o difficulty. Affect pleasant.  [employer] interrogation (personally reviewed): No VT/AF. 2.9 hr/day activity, 0% VP  ReDs reading: 33 %, normal  ASSESSMENT & PLAN:   1. Chronic systolic CHF - Mostly NICM (? H/o PCI to unknown vessel in 2011.) CTA 2018 normal cors.  - s/p [employer] ICD + Barostim - EF has remained low ~20-30% since 2020, slight increase in function to 30-35% on 10/24 echo.  - CPX 9/20 moderate to severe HF limitation. pVO2: 14.9 (52% predicted peak VO2).  slope: 31  pRER: 1.05  - RHC (6/24): RA 10, PCWP 11, CO/CI (TD) 2.9/1.78. (required DBA) - Echo (2/25) EF 25-30%, G1DD, normal RV, mild to moderate MR - CPX 9/25: Spirometry with moderate restriction. pVO2 13.8 (52%) adj to ibw pVO2 17.0  slope 34 pRER 1.10  - Etiology of NICM uncertain. EF has remained low despite cutting back ETOH significantly and medication compliance. ACE level normal. Myeloma labs unremarkable. - Unable to complete cMRI d/t barostim. Cardiac PET 9/25 non-diagnostic due to poor dietary prep. Will not reschedule at this time - NYHA III - Taking  lasix  40 bid. Volume OK ReDS 33% - Continue Jardiance  10 mg daily - Continue eplerenone  25 mg daily - Increase losartan  50 mg daily - Hold off on beta blocker for now with advanced symptoms - Seems to be progressing but I don't thjink quite ready for advanced therapies - Pending CRT upgrade 1/25 and will assess response   2. VT  - H/o Shocks from [employer] ICD (6/24), 2/2 hypomag and hypokalemia, ETOH abuse, and medication noncompliance. - VT s/p ATP/device shock on 06/24/23. No VT on device today - Continue amiodarone  200 mg daily, per EP - Amio labs UTD   3. Chronic atypical chest pain - not ischemic in presentation - still with atypical CP. No change   4. ETOH abuse - Hx heavy ETOH use, likely contributes to cardiomyopathy.  - Resumed drinking but cut back - Discussed need for cessation   5. CKD 3a  - Baseline SCr 1.3-1.6 - Continue Jardiance  - Labs today   6. HTN - Blood pressure well controlled. Continue current regimen.   7. CAD - h/o single vessel PCI - no angina - Continue ASA + atorvastatin  20 mg daily   8. OSA  - Unable to tolerate CPAP  Toribio Fuel, MD  2:22 PM   "

## 2023-12-24 NOTE — Patient Instructions (Signed)
 Medication Changes:  INCREASE Losartan  to 50 mg Daily  Lab Work:  Labs done today, your results will be available in MyChart, we will contact you for abnormal readings.   Special Instructions // Education:  Do the following things EVERYDAY: Weigh yourself in the morning before breakfast. Write it down and keep it in a log. Take your medicines as prescribed Eat low salt foods--Limit salt (sodium) to 2000 mg per day.  Stay as active as you can everyday Limit all fluids for the day to less than 2 liters   Follow-Up in: 2 months   At the [facility], you and your health needs are our priority. We have a designated team specialized in the treatment of Heart Failure. This Care Team includes your primary Heart Failure Specialized Cardiologist (physician), Advanced Practice Providers (APPs- Physician Assistants and Nurse Practitioners), and Pharmacist who all work together to provide you with the care you need, when you need it.   You may see any of the following providers on your designated Care Team at your next follow up:  Dr. Toribio Fuel Dr. Ezra Shuck Dr. Odis Brownie Greig Mosses, NP Caffie Shed, [place] [facility] [place], [place] Beckey Coe, NP Jordan Lee, NP Tinnie Redman, PharmD   Please be sure to bring in all your medications bottles to every appointment.   Need to Contact Us :  If you have any questions or concerns before your next appointment please send us  a message through [place] or call our office at [phone].    TO LEAVE A MESSAGE FOR THE NURSE SELECT OPTION 2, PLEASE LEAVE A MESSAGE INCLUDING: YOUR NAME DATE OF BIRTH CALL BACK NUMBER REASON FOR CALL**this is important as we prioritize the call backs  YOU WILL RECEIVE A CALL BACK THE SAME DAY AS LONG AS YOU CALL BEFORE 4:00 PM

## 2023-12-24 NOTE — Progress Notes (Signed)
"   ReDS Vest / Clip - 12/24/23 1406       ReDS Vest / Clip   Station Marker A    Ruler Value 37    ReDS Value Range Low volume    ReDS Actual Value 33          "

## 2024-01-03 ENCOUNTER — Ambulatory Visit: Payer: Self-pay | Admitting: Cardiology

## 2024-01-04 ENCOUNTER — Other Ambulatory Visit: Payer: Self-pay | Admitting: Nurse Practitioner

## 2024-01-04 DIAGNOSIS — I10 Essential (primary) hypertension: Secondary | ICD-10-CM

## 2024-01-05 ENCOUNTER — Other Ambulatory Visit (HOSPITAL_BASED_OUTPATIENT_CLINIC_OR_DEPARTMENT_OTHER): Payer: Self-pay

## 2024-01-05 ENCOUNTER — Other Ambulatory Visit (HOSPITAL_COMMUNITY): Payer: Self-pay

## 2024-01-05 ENCOUNTER — Encounter (HOSPITAL_COMMUNITY): Payer: Self-pay

## 2024-01-05 ENCOUNTER — Telehealth (HOSPITAL_COMMUNITY): Payer: Self-pay

## 2024-01-05 MED ORDER — TRAZODONE HCL 150 MG PO TABS
150.0000 mg | ORAL_TABLET | Freq: Every day | ORAL | 0 refills | Status: AC
  Filled 2024-01-05: qty 90, 90d supply, fill #0

## 2024-01-05 NOTE — Telephone Encounter (Signed)
 Attempted to reach patient to discuss upcoming procedure, no answer. Left VM for patient to return call.  Noted worsening kidney function on recent BMP. He has a h/o AKI from Feb 2025. No other documented renal diagnosis noted. Will make Dr. Inocencio aware.

## 2024-01-05 NOTE — Telephone Encounter (Signed)
 Requested Prescriptions  Pending Prescriptions Disp Refills   traZODone  (DESYREL ) 150 MG tablet 90 tablet 0    Sig: Take 1 tablet (150 mg total) by mouth at bedtime.     Psychiatry: Antidepressants - Serotonin Modulator Passed - 01/05/2024  3:52 PM      Passed - Completed PHQ-2 or PHQ-9 in the last 360 days      Passed - Valid encounter within last 6 months    Recent Outpatient Visits           5 months ago Hospital discharge follow-up   [facility] [place]. [facility] Theotis Haze ORN, NP   10 months ago Hospital discharge follow-up   [facility] Of [place]. [facility] Theotis Haze ORN, NP   1 year ago Primary hypertension   [facility] [place]. [facility] Theotis Haze ORN, NP   1 year ago Hospital discharge follow-up   [facility] Of [place]. [facility] Theotis Haze ORN, NP   1 year ago Primary hypertension    [facility] [place]. [facility] Theotis Haze ORN, [place]

## 2024-01-07 ENCOUNTER — Encounter: Payer: Self-pay | Admitting: Pharmacist

## 2024-01-07 ENCOUNTER — Other Ambulatory Visit: Payer: Self-pay

## 2024-01-07 NOTE — Telephone Encounter (Signed)
 Returned patient's missed call. No answer. Left message for patient to return call.

## 2024-01-10 NOTE — Telephone Encounter (Signed)
 Spoke with patient to discuss upcoming procedure.   Confirmed patient is scheduled for a Biventricular implantable cardioverter defibrillator, ICD Generator Removal on Friday, January 16 with Dr. Dr. Inocencio. Instructed patient to arrive at the Main Entrance A at [facility]: [address] [place], [place] [address] and check in at Admitting at 11:30 AM.   Labs: CBC needed   Any recent signs of acute illness or been started on antibiotics? No  Any new medications started? No Any medications to hold? Yes Medication instructions:  On the morning of your procedure HOLD Aspirin  and Lasix  (furosemide ). Take all other medications with sips of water .  No eating or drinking after midnight prior to procedure.   The night before your procedure and the morning of your procedure, wash thoroughly with the CHG surgical soap from the neck down, paying special attention to the area where your procedure will be performed.  Plan to go home the same day, you will only stay overnight if medically necessary. You MUST have a responsible adult to drive you home and MUST be with you the first 24 hours after you arrive home.  Informed patient a nurse will call a day before the procedure to confirm arrival time and ensure instructions are followed.  Patient verbalized understanding to all instructions provided and agreed to proceed with procedure.

## 2024-01-13 LAB — COMPREHENSIVE METABOLIC PANEL WITH GFR
ALT: 78 IU/L — ABNORMAL HIGH (ref 0–44)
AST: 66 IU/L — ABNORMAL HIGH (ref 0–40)
Albumin: 4.1 g/dL (ref 3.8–4.9)
Alkaline Phosphatase: 80 IU/L (ref 47–123)
BUN/Creatinine Ratio: 18 (ref 10–24)
BUN: 39 mg/dL — ABNORMAL HIGH (ref 8–27)
Bilirubin Total: 0.6 mg/dL (ref 0.0–1.2)
CO2: 19 mmol/L — ABNORMAL LOW (ref 20–29)
Calcium: 9 mg/dL (ref 8.6–10.2)
Chloride: 97 mmol/L (ref 96–106)
Creatinine, Ser: 2.12 mg/dL — ABNORMAL HIGH (ref 0.76–1.27)
Globulin, Total: 3.2 g/dL (ref 1.5–4.5)
Glucose: 100 mg/dL — ABNORMAL HIGH (ref 70–99)
Potassium: 4 mmol/L (ref 3.5–5.2)
Sodium: 133 mmol/L — ABNORMAL LOW (ref 134–144)
Total Protein: 7.3 g/dL (ref 6.0–8.5)
eGFR: 35 mL/min/1.73 — ABNORMAL LOW

## 2024-01-13 LAB — CBC
Hematocrit: 41.6 % (ref 37.5–51.0)
Hemoglobin: 13.6 g/dL (ref 13.0–17.7)
MCH: 32.2 pg (ref 26.6–33.0)
MCHC: 32.7 g/dL (ref 31.5–35.7)
MCV: 98 fL — ABNORMAL HIGH (ref 79–97)
Platelets: 189 x10E3/uL (ref 150–450)
RBC: 4.23 x10E6/uL (ref 4.14–5.80)
RDW: 11.8 % (ref 11.6–15.4)
WBC: 10.2 x10E3/uL (ref 3.4–10.8)

## 2024-01-13 LAB — T4, FREE: Free T4: 1.66 ng/dL (ref 0.82–1.77)

## 2024-01-13 LAB — TSH: TSH: 0.689 u[IU]/mL (ref 0.450–4.500)

## 2024-01-15 ENCOUNTER — Other Ambulatory Visit: Payer: Self-pay

## 2024-01-15 ENCOUNTER — Encounter (HOSPITAL_COMMUNITY): Payer: Self-pay

## 2024-01-15 ENCOUNTER — Emergency Department (HOSPITAL_COMMUNITY): Payer: Self-pay

## 2024-01-15 ENCOUNTER — Observation Stay (HOSPITAL_COMMUNITY)
Admission: EM | Admit: 2024-01-15 | Discharge: 2024-01-16 | Disposition: A | Payer: Self-pay | Attending: Internal Medicine | Admitting: Internal Medicine

## 2024-01-15 DIAGNOSIS — Z79899 Other long term (current) drug therapy: Secondary | ICD-10-CM | POA: Insufficient documentation

## 2024-01-15 DIAGNOSIS — F1010 Alcohol abuse, uncomplicated: Secondary | ICD-10-CM | POA: Insufficient documentation

## 2024-01-15 DIAGNOSIS — Z95810 Presence of automatic (implantable) cardiac defibrillator: Secondary | ICD-10-CM | POA: Insufficient documentation

## 2024-01-15 DIAGNOSIS — R0789 Other chest pain: Principal | ICD-10-CM | POA: Insufficient documentation

## 2024-01-15 DIAGNOSIS — R079 Chest pain, unspecified: Secondary | ICD-10-CM | POA: Diagnosis present

## 2024-01-15 DIAGNOSIS — N179 Acute kidney failure, unspecified: Secondary | ICD-10-CM | POA: Insufficient documentation

## 2024-01-15 DIAGNOSIS — Z91010 Allergy to peanuts: Secondary | ICD-10-CM | POA: Insufficient documentation

## 2024-01-15 DIAGNOSIS — I472 Ventricular tachycardia, unspecified: Principal | ICD-10-CM

## 2024-01-15 DIAGNOSIS — R7401 Elevation of levels of liver transaminase levels: Secondary | ICD-10-CM | POA: Insufficient documentation

## 2024-01-15 DIAGNOSIS — I2510 Atherosclerotic heart disease of native coronary artery without angina pectoris: Secondary | ICD-10-CM | POA: Insufficient documentation

## 2024-01-15 DIAGNOSIS — I5023 Acute on chronic systolic (congestive) heart failure: Secondary | ICD-10-CM | POA: Insufficient documentation

## 2024-01-15 DIAGNOSIS — I110 Hypertensive heart disease with heart failure: Secondary | ICD-10-CM | POA: Insufficient documentation

## 2024-01-15 DIAGNOSIS — Z7984 Long term (current) use of oral hypoglycemic drugs: Secondary | ICD-10-CM | POA: Insufficient documentation

## 2024-01-15 DIAGNOSIS — I5043 Acute on chronic combined systolic (congestive) and diastolic (congestive) heart failure: Secondary | ICD-10-CM

## 2024-01-15 DIAGNOSIS — Z7982 Long term (current) use of aspirin: Secondary | ICD-10-CM | POA: Insufficient documentation

## 2024-01-15 LAB — CBC WITH DIFFERENTIAL/PLATELET
Abs Immature Granulocytes: 0.1 K/uL — ABNORMAL HIGH (ref 0.00–0.07)
Basophils Absolute: 0.1 K/uL (ref 0.0–0.1)
Basophils Relative: 1 %
Eosinophils Absolute: 0.2 K/uL (ref 0.0–0.5)
Eosinophils Relative: 1 %
HCT: 42.5 % (ref 39.0–52.0)
Hemoglobin: 13.8 g/dL (ref 13.0–17.0)
Immature Granulocytes: 1 %
Lymphocytes Relative: 15 %
Lymphs Abs: 2 K/uL (ref 0.7–4.0)
MCH: 32.2 pg (ref 26.0–34.0)
MCHC: 32.5 g/dL (ref 30.0–36.0)
MCV: 99.3 fL (ref 80.0–100.0)
Monocytes Absolute: 0.8 K/uL (ref 0.1–1.0)
Monocytes Relative: 6 %
Neutro Abs: 10.2 K/uL — ABNORMAL HIGH (ref 1.7–7.7)
Neutrophils Relative %: 76 %
Platelets: 203 K/uL (ref 150–400)
RBC: 4.28 MIL/uL (ref 4.22–5.81)
RDW: 12.4 % (ref 11.5–15.5)
WBC: 13.4 K/uL — ABNORMAL HIGH (ref 4.0–10.5)
nRBC: 0 % (ref 0.0–0.2)

## 2024-01-15 LAB — I-STAT CHEM 8, ED
BUN: 40 mg/dL — ABNORMAL HIGH (ref 6–20)
Calcium, Ion: 1.14 mmol/L — ABNORMAL LOW (ref 1.15–1.40)
Chloride: 106 mmol/L (ref 98–111)
Creatinine, Ser: 2.3 mg/dL — ABNORMAL HIGH (ref 0.61–1.24)
Glucose, Bld: 129 mg/dL — ABNORMAL HIGH (ref 70–99)
HCT: 43 % (ref 39.0–52.0)
Hemoglobin: 14.6 g/dL (ref 13.0–17.0)
Potassium: 4 mmol/L (ref 3.5–5.1)
Sodium: 138 mmol/L (ref 135–145)
TCO2: 22 mmol/L (ref 22–32)

## 2024-01-15 LAB — HEPATIC FUNCTION PANEL
ALT: 101 U/L — ABNORMAL HIGH (ref 0–44)
AST: 76 U/L — ABNORMAL HIGH (ref 15–41)
Albumin: 4 g/dL (ref 3.5–5.0)
Alkaline Phosphatase: 77 U/L (ref 38–126)
Bilirubin, Direct: 0.3 mg/dL — ABNORMAL HIGH (ref 0.0–0.2)
Indirect Bilirubin: 0.4 mg/dL (ref 0.3–0.9)
Total Bilirubin: 0.6 mg/dL (ref 0.0–1.2)
Total Protein: 7.7 g/dL (ref 6.5–8.1)

## 2024-01-15 LAB — LIPASE, BLOOD: Lipase: 51 U/L (ref 11–51)

## 2024-01-15 LAB — MAGNESIUM: Magnesium: 2.1 mg/dL (ref 1.7–2.4)

## 2024-01-15 LAB — PRO BRAIN NATRIURETIC PEPTIDE: Pro Brain Natriuretic Peptide: 796 pg/mL — ABNORMAL HIGH

## 2024-01-15 LAB — TROPONIN T, HIGH SENSITIVITY
Troponin T High Sensitivity: 24 ng/L — ABNORMAL HIGH (ref 0–19)
Troponin T High Sensitivity: 24 ng/L — ABNORMAL HIGH (ref 0–19)

## 2024-01-15 LAB — ETHANOL: Alcohol, Ethyl (B): <15 mg/dL

## 2024-01-15 LAB — T4, FREE: Free T4: 1.47 ng/dL (ref 0.80–2.00)

## 2024-01-15 LAB — TSH: TSH: 0.67 u[IU]/mL (ref 0.350–4.500)

## 2024-01-15 LAB — I-STAT CG4 LACTIC ACID, ED: Lactic Acid, Venous: 1.1 mmol/L (ref 0.5–1.9)

## 2024-01-15 MED ORDER — THIAMINE HCL 100 MG/ML IJ SOLN
100.0000 mg | INTRAMUSCULAR | Status: DC
  Administered 2024-01-15 – 2024-01-16 (×2): 100 mg via INTRAVENOUS
  Filled 2024-01-15 (×2): qty 2

## 2024-01-15 MED ORDER — HEPARIN SODIUM (PORCINE) 5000 UNIT/ML IJ SOLN
5000.0000 [IU] | Freq: Three times a day (TID) | INTRAMUSCULAR | Status: DC
  Administered 2024-01-15 – 2024-01-16 (×4): 5000 [IU] via SUBCUTANEOUS
  Filled 2024-01-15 (×4): qty 1

## 2024-01-15 MED ORDER — AMIODARONE IV BOLUS ONLY 150 MG/100ML
150.0000 mg | Freq: Once | INTRAVENOUS | Status: AC
  Administered 2024-01-15: 150 mg via INTRAVENOUS
  Filled 2024-01-15: qty 100

## 2024-01-15 MED ORDER — LORAZEPAM 1 MG PO TABS: 1.0000 mg | ORAL_TABLET | ORAL | Status: DC | PRN

## 2024-01-15 MED ORDER — AMIODARONE HCL IN DEXTROSE 360-4.14 MG/200ML-% IV SOLN
30.0000 mg/h | INTRAVENOUS | Status: DC
  Administered 2024-01-15 – 2024-01-16 (×2): 30 mg/h via INTRAVENOUS
  Filled 2024-01-15 (×2): qty 200

## 2024-01-15 MED ORDER — POLYETHYLENE GLYCOL 3350 17 G PO PACK: 17.0000 g | PACK | Freq: Every day | ORAL | Status: DC | PRN

## 2024-01-15 MED ORDER — MELATONIN 3 MG PO TABS
3.0000 mg | ORAL_TABLET | Freq: Every evening | ORAL | Status: DC | PRN
  Administered 2024-01-15: 3 mg via ORAL
  Filled 2024-01-15: qty 1

## 2024-01-15 MED ORDER — ACETAMINOPHEN 325 MG PO TABS: 650.0000 mg | ORAL_TABLET | Freq: Four times a day (QID) | ORAL | Status: DC | PRN

## 2024-01-15 MED ORDER — FOLIC ACID 1 MG PO TABS
1.0000 mg | ORAL_TABLET | Freq: Every day | ORAL | Status: DC
  Administered 2024-01-15 – 2024-01-16 (×2): 1 mg via ORAL
  Filled 2024-01-15 (×2): qty 1

## 2024-01-15 MED ORDER — HYDRALAZINE HCL 20 MG/ML IJ SOLN: 5.0000 mg | INTRAMUSCULAR | Status: DC | PRN

## 2024-01-15 MED ORDER — ACETAMINOPHEN 650 MG RE SUPP: 650.0000 mg | Freq: Four times a day (QID) | RECTAL | Status: DC | PRN

## 2024-01-15 MED ORDER — PANTOPRAZOLE SODIUM 40 MG IV SOLR
40.0000 mg | Freq: Two times a day (BID) | INTRAVENOUS | Status: DC
  Administered 2024-01-15 – 2024-01-16 (×3): 40 mg via INTRAVENOUS
  Filled 2024-01-15 (×3): qty 10

## 2024-01-15 MED ORDER — SODIUM CHLORIDE 0.9% FLUSH
3.0000 mL | Freq: Two times a day (BID) | INTRAVENOUS | Status: DC
  Administered 2024-01-15 – 2024-01-16 (×3): 3 mL via INTRAVENOUS

## 2024-01-15 MED ORDER — MEXILETINE HCL 200 MG PO CAPS
200.0000 mg | ORAL_CAPSULE | Freq: Two times a day (BID) | ORAL | Status: DC
  Administered 2024-01-15 – 2024-01-16 (×3): 200 mg via ORAL
  Filled 2024-01-15 (×4): qty 1

## 2024-01-15 MED ORDER — ATORVASTATIN CALCIUM 10 MG PO TABS
20.0000 mg | ORAL_TABLET | Freq: Every day | ORAL | Status: DC
  Administered 2024-01-16: 20 mg via ORAL
  Filled 2024-01-15: qty 2

## 2024-01-15 MED ORDER — FUROSEMIDE 10 MG/ML IJ SOLN
80.0000 mg | Freq: Once | INTRAMUSCULAR | Status: AC
  Administered 2024-01-15: 80 mg via INTRAVENOUS
  Filled 2024-01-15: qty 8

## 2024-01-15 MED ORDER — AMIODARONE HCL IN DEXTROSE 360-4.14 MG/200ML-% IV SOLN
60.0000 mg/h | INTRAVENOUS | Status: AC
  Administered 2024-01-15: 60 mg/h via INTRAVENOUS
  Filled 2024-01-15: qty 200

## 2024-01-15 MED ORDER — ADULT MULTIVITAMIN W/MINERALS CH
1.0000 | ORAL_TABLET | Freq: Every day | ORAL | Status: DC
  Administered 2024-01-15 – 2024-01-16 (×2): 1 via ORAL
  Filled 2024-01-15 (×2): qty 1

## 2024-01-15 MED ORDER — ASPIRIN 81 MG PO TBEC
81.0000 mg | DELAYED_RELEASE_TABLET | Freq: Every day | ORAL | Status: DC
  Administered 2024-01-16: 81 mg via ORAL
  Filled 2024-01-15: qty 1

## 2024-01-15 MED ORDER — LORAZEPAM 2 MG/ML IJ SOLN: 1.0000 mg | INTRAMUSCULAR | Status: DC | PRN

## 2024-01-15 NOTE — ED Triage Notes (Signed)
 Pt c.o chest pain and sob that has been intermittent for the past week but got worse this morning starting around 7am. Pt has an ICD is place.

## 2024-01-15 NOTE — Evaluation (Signed)
 Physical Therapy Brief Evaluation and Discharge Note Patient Details Name: Stanley [name] MRN: [id number] DOB: 1965/03/03 Today's Date: 01/15/2024   History of Present Illness  Pt is a 59 y/o male admitted 1/10 with chest pain.  Pt found to be in afib with RVR up in the 170's.   Medically converted to SR.  PMHx:  ARF, Alcohol abuse, CAD, chronic systolic CHF, Gouty arthritis, HTN, MI, sleep apnea  Clinical Impression  Pt is at or close to baseline functioning and should be safe at home alone. There are no further acute PT needs.  Will sign off at this time.        PT Assessment    Assistance Needed at Discharge       Equipment Recommendations None recommended by PT  Recommendations for Other Services       Precautions/Restrictions Precautions Precautions: None Recall of Precautions/Restrictions: Intact        Mobility  Bed Mobility          Transfers Overall transfer level: Independent                      Ambulation/Gait Ambulation/Gait assistance: Independent Gait Distance (Feet): 400 Feet Assistive device: None Gait Pattern/deviations: Step-through pattern   General Gait Details: WFL and age appropriate speeds.  Home Activity Instructions    Stairs            Modified Rankin (Stroke Patients Only)        Balance                   Dynamic Gait Index Level Surface: Normal Change in Gait Speed: Normal Gait with Horizontal Head Turns: Normal Gait with Vertical Head Turns: Normal Gait and Pivot Turn: Normal Step Over Obstacle: Normal Step Around Obstacles: Normal Steps: Mild Impairment Total Score: 23      Pertinent Vitals/Pain   Pain Assessment Pain Assessment: Faces Faces Pain Scale: No hurt Pain Intervention(s): Monitored during session     Home Living   Living Arrangements: Alone       Home Equipment: Rolling Walker (2 wheels)        Prior Function        UE/LE Assessment                Communication   Communication Communication: No apparent difficulties     Cognition         General Comments General comments (skin integrity, edema, etc.): HR maintained in the 80's during gait.  SpO2 94% with good PLETH    Exercises     Assessment/Plan    PT Problem List         PT Visit Diagnosis      No Skilled PT Patient is independent with all acitivity/mobility   Co-evaluation                AMPAC 6 Clicks Help needed turning from your back to your side while in a flat bed without using bedrails?: None Help needed moving from lying on your back to sitting on the side of a flat bed without using bedrails?: None Help needed moving to and from a bed to a chair (including a wheelchair)?: None Help needed standing up from a chair using your arms (e.g., wheelchair or bedside chair)?: None Help needed to walk in hospital room?: None Help needed climbing 3-5 steps with a railing? : None 6 Click Score: 24      End of Session  Activity Tolerance: Patient tolerated treatment well;No increased pain Patient left: in bed;with call bell/phone within reach;with family/visitor present Nurse Communication: Mobility status       Time: 1655-1710 PT Time Calculation (min) (ACUTE ONLY): 15 min  Charges:          01/15/2024  [place] HERO., PT Acute Rehabilitation Services [phone]  (office)  Vinie GAILS Soni Kegel  01/15/2024, 5:19 PM

## 2024-01-15 NOTE — H&P (Signed)
 " History and Physical    Patient: Stanley [name] FMW:[id number] DOB: 1965-12-20 DOA: 01/15/2024 DOS: the patient was seen and examined on 01/15/2024 . PCP: Theotis Haze ORN, NP  Patient coming from: Home Chief complaint: Chief Complaint  Patient presents with   Chest Pain   HPI:  Stanley [name] is a 59 y.o. male with past medical history  of  systolic HF due to NICM d/x'd in 2006 (felt 2/2 HTN/ETOH), ? CAD s/p PCI to unknown vessel 2011 at outside location (cath 2014 without CAD and normal coronaries by CT 07/2016), VT s/p [facility] ICD (2015), noncompliance, chronic noncardiac chest pain, depression, HTN and ETOH abuse, coming in today for chest pain has been going off and on for the past week progressively getting worse today morning started at around 7 and found to be in V. tach.  Per ED provider AICD is not functioning and EP cardiology was consulted in the emergency room and patient was seen and started on amiodarone  infusion. Chart review shows patient was seen by cardiology on December 24, 2023.  Per cardiology note recommendations for his chronic systolic CHF as below: Unable to complete cMRI d/t barostim. Cardiac PET 9/25 non-diagnostic due to poor dietary prep. Will not reschedule at this time - NYHA III - Taking lasix  40 bid. Volume OK ReDS 33% - Continue Jardiance  10 mg daily - Continue eplerenone  25 mg daily - Increase losartan  50 mg daily - Hold off on beta blocker for now with advanced symptoms - Seems to be progressing but I don't thjink quite ready for advanced therapies - Pending CRT upgrade 1/25 and will assess response . Patient was continued on amiodarone  200 twice daily for his V. tach episodes.    ED Course:  Vital signs in the ED were notable for the following:  Vitals:   01/15/24 1125 01/15/24 1130 01/15/24 1145 01/15/24 1230  BP: 98/79 (!) 85/73 (!) 119/102 108/75  Pulse: (!) 56 (!) 59 81 82  Temp:      Resp: 11 18 (!) 27   SpO2: 100% 100% 99% 99%   TempSrc:       >>ED evaluation thus far shows: EKG today shows monomorphic V. tach with a heart rate of Heart rate at 170 QTc of 551, proBNP of 796, troponin T of 24. Initial basic metabolic panel shows glucose 129 BUN of 40 creatinine 2.30. CBC shows white count of 13.4 hemoglobin 13.8 platelets of 203. Ethanol level, hepatic function, lactic acid, ordered and pending.  >>While in the ED patient received the following: Medications  amiodarone  (NEXTERONE  PREMIX) 360-4.14 MG/200ML-% (1.8 mg/mL) IV infusion (has no administration in time range)  amiodarone  (NEXTERONE  PREMIX) 360-4.14 MG/200ML-% (1.8 mg/mL) IV infusion (has no administration in time range)  mexiletine (MEXITIL ) capsule 200 mg (has no administration in time range)  furosemide  (LASIX ) injection 80 mg (has no administration in time range)  amiodarone  (NEXTERONE ) 1.5 mg/mL IV bolus only 150 mg (0 mg Intravenous Stopped 01/15/24 1142)   Review of Systems  Respiratory:  Positive for shortness of breath.   Cardiovascular:  Positive for chest pain.  Neurological:  Positive for dizziness.  Psychiatric/Behavioral:  Negative for depression and suicidal ideas.   All other systems reviewed and are negative.  Past Medical History:  Diagnosis Date   Acute renal failure (ARF)    Acute respiratory failure with hypoxia (HCC) 12/29/2018   Alcohol abuse    Anxiety    CAD (coronary artery disease)    a. dx unclear, reported  PCI in 2011 but cath 2014 normal coronaries and cardiac CT 07/2016 normal coronaries, no mention of stents.   Chronic chest pain    Chronic systolic CHF (congestive heart failure) (HCC)    Depression    Dyspnea    07/05/2019: per patient some times with exertion, has to catch breath   Family history of breast cancer 05/30/2021   Family history of pancreatic cancer 05/30/2021   Financial difficulties    Gout    Gouty arthritis    Headache    History of noncompliance with medical treatment    financial challenges    Hypertension    ICD (implantable cardioverter-defibrillator) in place    [employer] ICD implant done in [place] 2015   Insomnia    Lobar pneumonia 12/28/2018   Myocardial infarction (HCC) 2005   Nonischemic cardiomyopathy (HCC)    Sleep apnea    per patient, has CPAP does not wear it every night   Ventricular tachycardia (HCC) 01/2015   2017 - ATP delivered for sinus tach, degenerated into VT for which he received shock. Recurrent VT 03/2018.   Past Surgical History:  Procedure Laterality Date   BAROREFLEX SYSTEM INSERTION     BIOPSY  03/06/2021   Procedure: BIOPSY;  Surgeon: Leigh Elspeth SQUIBB, MD;  Location: [facility];  Service: Gastroenterology;;   CARDIAC DEFIBRILLATOR PLACEMENT  06/05/2013   [employer] Inogen ICD implanted at [facility] in [place] for primary prevention   COLONOSCOPY WITH PROPOFOL  N/A 03/06/2021   Procedure: COLONOSCOPY WITH PROPOFOL ;  Surgeon: Leigh Elspeth SQUIBB, MD;  Location: [facility];  Service: Gastroenterology;  Laterality: N/A;   ESOPHAGEAL DILATION  03/06/2021   Procedure: ESOPHAGEAL DILATION;  Surgeon: Leigh Elspeth SQUIBB, MD;  Location: [facility];  Service: Gastroenterology;;   ESOPHAGOGASTRODUODENOSCOPY (EGD) WITH PROPOFOL  N/A 03/06/2021   Procedure: ESOPHAGOGASTRODUODENOSCOPY (EGD) WITH PROPOFOL ;  Surgeon: Leigh Elspeth SQUIBB, MD;  Location: [facility];  Service: Gastroenterology;  Laterality: N/A;   ESOPHAGOGASTRODUODENOSCOPY (EGD) WITH PROPOFOL  N/A 05/18/2021   Procedure: ESOPHAGOGASTRODUODENOSCOPY (EGD) WITH PROPOFOL ;  Surgeon: Legrand Victory LITTIE DOUGLAS, MD;  Location: [facility];  Service: Gastroenterology;  Laterality: N/A;   HERNIA REPAIR     PERCUTANEOUS CORONARY STENT INTERVENTION (PCI-S)  01/05/2009   POLYPECTOMY  03/06/2021   Procedure: POLYPECTOMY;  Surgeon: Leigh Elspeth SQUIBB, MD;  Location: [facility];  Service: Gastroenterology;;   RIGHT HEART CATH N/A 06/11/2022   Procedure: RIGHT HEART CATH;  Surgeon:  Rolan Ezra RAMAN, MD;  Location: [facility] INVASIVE CV LAB;  Service: Cardiovascular;  Laterality: N/A;    reports that he has never smoked. He has never used smokeless tobacco. He reports current alcohol use. He reports that he does not use drugs. Allergies[1] Family History  Problem Relation Age of Onset   Breast cancer Mother    Pancreatic cancer Mother    Hypertension Father    Alzheimer's disease Father    Gout Father    Dementia Father    Hypertension Sister    Clotting disorder Sister    Obesity Brother    Bronchitis Brother    Cancer Maternal Uncle        unknown type; dx after 34; x2 maternal uncles   Breast cancer Paternal Aunt        dx after 49   Lung cancer Paternal Uncle    Heart disease Maternal Grandmother    Gout Paternal Grandfather    Colon polyps Neg Hx    Colon cancer Neg Hx    Esophageal  cancer Neg Hx    Stomach cancer Neg Hx    Prior to Admission medications  Medication Sig Start Date End Date Taking? Authorizing Provider  allopurinol  (ZYLOPRIM ) 300 MG tablet Take 1 tablet (300 mg total) by mouth daily. 07/26/23   Fleming, Zelda W, NP  amiodarone  (PACERONE ) 200 MG tablet Take 1 tablet (200 mg total) by mouth daily. 09/28/23   Bensimhon, Toribio SAUNDERS, MD  aspirin  EC 81 MG tablet Take 81 mg by mouth daily. Swallow whole.    [provider]  atorvastatin  (LIPITOR) 20 MG tablet Take 1 tablet (20 mg total) by mouth daily. 07/26/23   Fleming, Zelda W, NP  empagliflozin  (JARDIANCE ) 10 MG TABS tablet Take 1 tablet (10 mg total) by mouth daily. 03/05/23   Glena Harlene HERO, FNP  eplerenone  (INSPRA ) 25 MG tablet Take 1 tablet (25 mg total) by mouth daily. 03/31/23   Bensimhon, Toribio SAUNDERS, MD  furosemide  (LASIX ) 40 MG tablet Take 2 tablets (80 mg total) by mouth in the morning AND 1 tablet (40 mg total) every evening. 09/01/23   Milford, Harlene HERO, FNP  losartan  (COZAAR ) 50 MG tablet Take 1 tablet (50 mg total) by mouth daily. 12/24/23   Bensimhon, Toribio SAUNDERS, MD  potassium  chloride SA (KLOR-CON  M) 20 MEQ tablet Take 2 tablets (40 mEq total) by mouth daily. 09/01/23   Glena Harlene HERO, FNP  traZODone  (DESYREL ) 150 MG tablet Take 1 tablet (150 mg total) by mouth at bedtime. 01/05/24   Theotis Haze ORN, NP                                                                                 Vitals:   01/15/24 1125 01/15/24 1130 01/15/24 1145 01/15/24 1230  BP: 98/79 (!) 85/73 (!) 119/102 108/75  Pulse: (!) 56 (!) 59 81 82  Resp: 11 18 (!) 27   Temp:      TempSrc:      SpO2: 100% 100% 99% 99%   Physical Exam Vitals reviewed.  Constitutional:      General: He is not in acute distress.    Appearance: He is not ill-appearing.  HENT:     Head: Normocephalic.  Eyes:     Extraocular Movements: Extraocular movements intact.  Cardiovascular:     Rate and Rhythm: Normal rate and regular rhythm.     Pulses: Normal pulses.     Heart sounds: Normal heart sounds.  Pulmonary:     Effort: Pulmonary effort is normal.     Breath sounds: Normal breath sounds.  Abdominal:     General: There is no distension.     Palpations: Abdomen is soft.     Tenderness: There is no abdominal tenderness.  Musculoskeletal:     Right lower leg: No edema.     Left lower leg: No edema.  Neurological:     General: No focal deficit present.     Mental Status: He is alert and oriented to person, place, and time.     Labs on Admission: I have personally reviewed following labs and imaging studies CBC: Recent Labs  Lab 01/12/24 1636 01/15/24 1115 01/15/24 1138  WBC 10.2 13.4*  --  NEUTROABS  --  10.2*  --   HGB 13.6 13.8 14.6  HCT 41.6 42.5 43.0  MCV 98* 99.3  --   PLT 189 203  --    Basic Metabolic Panel: Recent Labs  Lab 01/12/24 1636 01/15/24 1115 01/15/24 1138  NA 133*  --  138  K 4.0  --  4.0  CL 97  --  106  CO2 19*  --   --   GLUCOSE 100*  --  129*  BUN 39*  --  40*  CREATININE 2.12*  --  2.30*  CALCIUM  9.0  --   --   MG  --  2.1  --    GFR: CrCl cannot be  calculated (Unknown ideal weight.). Liver Function Tests: Recent Labs  Lab 01/12/24 1636  AST 66*  ALT 78*  ALKPHOS 80  BILITOT 0.6  PROT 7.3  ALBUMIN 4.1   No results for input(s): LIPASE, AMYLASE in the last 168 hours. No results for input(s): AMMONIA in the last 168 hours. Recent Labs    06/24/23 1405 06/25/23 0414 07/02/23 1443 07/16/23 1338 07/26/23 1547 08/20/23 1348 09/08/23 1438 12/24/23 1459 01/12/24 1636 01/15/24 1138  BUN 27* 30* 25* 30* 34* 27* 32* 39* 39* 40*  CREATININE 1.60* 1.78* 1.57* 1.94* 1.92* 1.71* 1.79* 2.07* 2.12* 2.30*    Cardiac Enzymes: No results for input(s): CKTOTAL, CKMB, CKMBINDEX, TROPONINI in the last 168 hours. BNP (last 3 results) Recent Labs    12/24/23 1459 01/15/24 1115  PROBNP 449.0* 796.0*   HbA1C: No results for input(s): HGBA1C in the last 72 hours. CBG: No results for input(s): GLUCAP in the last 168 hours. Lipid Profile: No results for input(s): CHOL, HDL, LDLCALC, TRIG, CHOLHDL, LDLDIRECT in the last 72 hours. Thyroid  Function Tests: Recent Labs    01/12/24 1636  TSH 0.689  FREET4 1.66   Anemia Panel: No results for input(s): VITAMINB12, FOLATE, FERRITIN, TIBC, IRON, RETICCTPCT in the last 72 hours. Urine analysis:    Component Value Date/Time   COLORURINE YELLOW 07/05/2019 1420   APPEARANCEUR Clear 02/11/2022 1603   LABSPEC 1.015 07/05/2019 1420   PHURINE 6.0 07/05/2019 1420   GLUCOSEU Negative 02/11/2022 1603   HGBUR NEGATIVE 07/05/2019 1420   BILIRUBINUR Negative 02/11/2022 1603   KETONESUR negative 02/19/2020 1031   KETONESUR NEGATIVE 07/05/2019 1420   PROTEINUR 1+ (A) 02/11/2022 1603   PROTEINUR NEGATIVE 07/05/2019 1420   UROBILINOGEN 0.2 02/19/2020 1031   NITRITE Negative 02/11/2022 1603   NITRITE NEGATIVE 07/05/2019 1420   LEUKOCYTESUR Negative 02/11/2022 1603   LEUKOCYTESUR NEGATIVE 07/05/2019 1420   Radiological Exams on Admission: DG Chest  Portable 1 View Result Date: 01/15/2024 CLINICAL DATA:  Worsening chest pain and shortness of breath. EXAM: PORTABLE CHEST 1 VIEW COMPARISON:  06/24/2023 and CT chest 12/28/2018. FINDINGS: Inspire device along the right chest and neck. ICD lead tip is in the right ventricle. Heart is enlarged, stable. Lungs are clear. No pleural fluid. IMPRESSION: No acute findings. Electronically Signed   By: Newell Eke M.D.   On: 01/15/2024 12:07   Data Reviewed: Relevant notes from primary care and specialist visits, past discharge summaries as available in EHR, including Care Everywhere . Prior diagnostic testing as pertinent to current admission diagnoses, Updated medications and problem lists for reconciliation .ED course, including vitals, labs, imaging, treatment and response to treatment,Triage notes, nursing and pharmacy notes and ED provider's notes.Notable results as noted in HPI.Discussed case with EDMD/ ED APP/ or Specialty MD on call and as  needed.  Assessment & Plan  >> Chest pain: Patient presenting with intermittent chest pain suspect this is from demand ischemia from his sustained V. Tach appreciate cardiology consult and management.  Will admit patient to progressive unit with continuous cardiac monitoring continuous pulse oximetry repeat troponin, continue aspirin  and atorvastatin .  >> Monomorphic V. Tach + AICD : Sustained V. tach on presentation with a heart rate of 170 reportedly ICD malfunction evaluated by EP cards had has been reprogrammed patient started on amiodarone  infusion and mexiletine 200 mg which patient has received. Close monitoring to electrolytes. Discussed in detail about patient's alcohol overuse.  Advised that patient start cutting back and changing his beverage to wine and then as needed.  >> Reduced ejection fraction congestive heart failure, nonischemic cardiomyopathy: Based on most recent echo done on February 2025 ejection fraction has 25 to 30%.  >> Alcohol  abuse: Have discussed with patient about cutting back and then changing his alcohol beverage from his hard liquor to wine and then to as needed.  Patient denies any history of withdrawals.  Currently he states he is taking a shot of alcohol every day.  I encouraged him he has a supportive family system grandchildren daughter and ex-wife that care for him.  He denies any suicidal thoughts or ideation.  Patient given single dose of thiamine . MCV and platelets are within normal limits.  No reports of any bleeding he has had rectal bleeding about a year ago but none since.  >> AKI on CKD: Current creatinine of 2.30 baseline about 2.07. Again multifactorial from prerenal component due to hypotension from V. tach.  Will hold patient's PTA meds including losartan  and eplerenone .Renally dose meds. Avoid contrast and will take CIN precautions in event contrast study or procedure is needed.    >>Chronic systolic congestive heart failure: Echo per cardiology. D/W pt about sodium diary.  Strict I/O daily weights, pt on amiodarone  infusion and dietitian consult.   >>Mild transaminitis: Pt had mild elevation of lft on December 2025 cmp 2/2 to alcohol.     Latest Ref Rng & Units 01/15/2024   11:38 AM 01/12/2024    4:36 PM 12/24/2023    2:59 PM  CMP  Glucose 70 - 99 mg/dL 870  899  890   BUN 6 - 20 mg/dL 40  39  39   Creatinine 0.61 - 1.24 mg/dL 7.69  7.87  7.92   Sodium 135 - 145 mmol/L 138  133  135   Potassium 3.5 - 5.1 mmol/L 4.0  4.0  3.9   Chloride 98 - 111 mmol/L 106  97  99   CO2 20 - 29 mmol/L  19  20   Calcium  8.6 - 10.2 mg/dL  9.0  9.1   Total Protein 6.0 - 8.5 g/dL  7.3    Total Bilirubin 0.0 - 1.2 mg/dL  0.6    Alkaline Phos 47 - 123 IU/L  80    AST 0 - 40 IU/L  66    ALT 0 - 44 IU/L  78    We will get A1c and lipid panel.    DVT prophylaxis:  Heparin .  Consults:  EP cardiology.   Advance Care Planning:    Code Status: Full Code   Family Communication:  None.  Disposition  Plan:  Home.  Severity of Illness: The appropriate patient status for this patient is INPATIENT. Inpatient status is judged to be reasonable and necessary in order to provide the required intensity of service to  ensure the patient's safety. The patient's presenting symptoms, physical exam findings, and initial radiographic and laboratory data in the context of their chronic comorbidities is felt to place them at high risk for further clinical deterioration. Furthermore, it is not anticipated that the patient will be medically stable for discharge from the hospital within 2 midnights of admission.   * I certify that at the point of admission it is my clinical judgment that the patient will require inpatient hospital care spanning beyond 2 midnights from the point of admission due to high intensity of service, high risk for further deterioration and high frequency of surveillance required.*  Unresulted Labs (From admission, onward)     Start     Ordered   01/15/24 1312  TSH  Once,   R        01/15/24 1311   01/15/24 1312  T4, free  Add-on,   AD        01/15/24 1311   01/15/24 1223  Ethanol  Add-on,   AD        01/15/24 1232   01/15/24 1223  Hepatic function panel  Add-on,   AD        01/15/24 1232            Meds ordered this encounter  Medications   amiodarone  (NEXTERONE ) 1.5 mg/mL IV bolus only 150 mg   amiodarone  (NEXTERONE  PREMIX) 360-4.14 MG/200ML-% (1.8 mg/mL) IV infusion   amiodarone  (NEXTERONE  PREMIX) 360-4.14 MG/200ML-% (1.8 mg/mL) IV infusion   mexiletine (MEXITIL ) capsule 200 mg   furosemide  (LASIX ) injection 80 mg   aspirin  EC tablet 81 mg   atorvastatin  (LIPITOR) tablet 20 mg   thiamine  (VITAMIN B1) injection 100 mg   pantoprazole  (PROTONIX ) injection 40 mg   sodium chloride  flush (NS) 0.9 % injection 3 mL   OR Linked Order Group    acetaminophen  (TYLENOL ) tablet 650 mg    acetaminophen  (TYLENOL ) suppository 650 mg   polyethylene glycol (MIRALAX  / GLYCOLAX ) packet  17 g   hydrALAZINE  (APRESOLINE ) injection 5 mg   heparin  injection 5,000 Units     Orders Placed This Encounter  Procedures   DG Chest Portable 1 View   CBC with Differential   Pro Brain natriuretic peptide   Magnesium    Ethanol   Hepatic function panel   TSH   T4, free   Diet Heart Room service appropriate? Yes; Fluid consistency: Thin; Fluid restriction: Other (see comments)   Maintain IV access   Vital signs   Notify physician (specify)   Mobility Protocol: No Restrictions   Refer to Sidebar Report Mobility Protocol for Adult Inpatient   Initiate Adult Central Line Maintenance and Catheter Clearance Protocol for patients with central line (CVC, PICC, Port, Hemodialysis, Trialysis)   If patient diabetic or glucose greater than 140 notify physician for Sliding Scale Insulin Orders   Initiate CHG Protocol for patients in ICU/SD or any patient with a central line or foley catheter   Do not place and if present remove PureWick   Initiate Oral Care Protocol   Initiate Carrier Fluid Protocol   RN may order General Admission PRN Orders utilizing General Admission PRN medications (through manage orders) for the following patient needs: allergy symptoms (Claritin), cold sores (Carmex), cough (Robitussin DM), eye irritation (Liquifilm Tears), hemorrhoids (Tucks), indigestion (Maalox), minor skin irritation (Hydrocortisone Cream), muscle pain Lucienne Gay), nose irritation (saline nasal spray) and sore throat (Chloraseptic spray).   Cardiac Monitoring - Continuous Indefinite   Ambulate with assistance  Full code   Inpatient consult to Cardiology  arrythmia   Consult to hospitalist   Nutritional services consult   Consult to Registered Dietitian   OT eval and treat   PT eval and treat   Pulse oximetry check with vital signs   Oxygen therapy Mode or (Route): Nasal cannula; Liters Per Minute: 2; Keep O2 saturation between: greater than 92 %   Incentive spirometry   I-stat chem 8, ED    I-Stat CG4 Lactic Acid   EKG 12-Lead   EKG 12-Lead   Admit to Inpatient (patient's expected length of stay will be greater than 2 midnights or inpatient only procedure)   Aspiration precautions   Fall precautions    Author: Mario LULLA Blanch, MD 12 pm- 8 pm. [facility]. 01/15/2024 1:37 PM Please note for any communication after hours contact [facility] Assigned provider on call on Amion.     [1]  Allergies Allergen Reactions   Entresto  [Sacubitril -Valsartan ] Other (See Comments)    History of angioedema   Lisinopril Shortness Of Breath    Makes lips swell   Other Hives and Swelling    Peanuts   Shellfish Allergy Hives and Swelling    Lip Swelling   Allopurinol  Nausea Only and Other (See Comments)    dizziness   Spironolactone  Other (See Comments)    Breast tenderness   "

## 2024-01-15 NOTE — Consult Note (Signed)
 "  Cardiology Consultation   Patient ID: Stanley [name] MRN: [id number]; DOB: [birth year]  Admit date: 01/15/2024 Date of Consult: 01/15/2024  PCP:  Theotis Haze ORN, NP   [facility] Providers Cardiologist:  Toribio Fuel, MD  Electrophysiologist:  Soyla Gladis Norton, MD  Electrophysiology APP:  Lesia Ozell Barter, PA-C       History of Present Illness: [name] is a 59 year old male with past medical history notable for chronic systolic heart failure secondary to nonischemic cardiomyopathy, ventricular tachycardia status post ICD, depression, hypertension, alcohol abuse who is being seen today at the request of Dr. Rankin River for ventricular tachycardia.  Patient reports that he had been feeling unwell for a few days, primarily with more shortness of breath.  He believes he is holding onto some fluid in his abdomen.  No lower extremity edema.  Today, he developed palpitations and feeling like he was going to die .  He presented to the ED for evaluation was found to be in monomorphic ventricular tachycardia with rates around 170 bpm.  He received 150 mg of IV amiodarone  and converted to sinus rhythm.  In sinus, he reports feeling much better.   Past Medical History:  Diagnosis Date   Acute renal failure (ARF)    Acute respiratory failure with hypoxia (HCC) 12/29/2018   Alcohol abuse    Anxiety    CAD (coronary artery disease)    a. dx unclear, reported PCI in 2011 but cath 2014 normal coronaries and cardiac CT 07/2016 normal coronaries, no mention of stents.   Chronic chest pain    Chronic systolic CHF (congestive heart failure) (HCC)    Depression    Dyspnea    07/05/2019: per patient some times with exertion, has to catch breath   Family history of breast cancer 05/30/2021   Family history of pancreatic cancer 05/30/2021   Financial difficulties    Gout    Gouty arthritis    Headache    History of noncompliance with medical treatment    financial  challenges   Hypertension    ICD (implantable cardioverter-defibrillator) in place    [employer] ICD implant done in [place] 2015   Insomnia    Lobar pneumonia 12/28/2018   Myocardial infarction (HCC) 2005   Nonischemic cardiomyopathy (HCC)    Sleep apnea    per patient, has CPAP does not wear it every night   Ventricular tachycardia (HCC) 01/2015   2017 - ATP delivered for sinus tach, degenerated into VT for which he received shock. Recurrent VT 03/2018.    Past Surgical History:  Procedure Laterality Date   BAROREFLEX SYSTEM INSERTION     BIOPSY  03/06/2021   Procedure: BIOPSY;  Surgeon: Leigh Elspeth SQUIBB, MD;  Location: [facility];  Service: Gastroenterology;;   CARDIAC DEFIBRILLATOR PLACEMENT  06/05/2013   [employer] Inogen ICD implanted at [place] in [place] for primary prevention   COLONOSCOPY WITH PROPOFOL  N/A 03/06/2021   Procedure: COLONOSCOPY WITH PROPOFOL ;  Surgeon: Leigh Elspeth SQUIBB, MD;  Location: [facility];  Service: Gastroenterology;  Laterality: N/A;   ESOPHAGEAL DILATION  03/06/2021   Procedure: ESOPHAGEAL DILATION;  Surgeon: Leigh Elspeth SQUIBB, MD;  Location: [facility];  Service: Gastroenterology;;   ESOPHAGOGASTRODUODENOSCOPY (EGD) WITH PROPOFOL  N/A 03/06/2021   Procedure: ESOPHAGOGASTRODUODENOSCOPY (EGD) WITH PROPOFOL ;  Surgeon: Leigh Elspeth SQUIBB, MD;  Location: [facility];  Service: Gastroenterology;  Laterality: N/A;   ESOPHAGOGASTRODUODENOSCOPY (EGD) WITH PROPOFOL  N/A 05/18/2021   Procedure: ESOPHAGOGASTRODUODENOSCOPY (EGD) WITH PROPOFOL ;  Surgeon:  Legrand Victory LITTIE DOUGLAS, MD;  Location: [facility] ENDOSCOPY;  Service: Gastroenterology;  Laterality: N/A;   HERNIA REPAIR     PERCUTANEOUS CORONARY STENT INTERVENTION (PCI-S)  01/05/2009   POLYPECTOMY  03/06/2021   Procedure: POLYPECTOMY;  Surgeon: Leigh Elspeth SQUIBB, MD;  Location: [facility];  Service: Gastroenterology;;   RIGHT HEART CATH N/A 06/11/2022   Procedure: RIGHT HEART CATH;   Surgeon: Rolan Ezra RAMAN, MD;  Location: [facility] INVASIVE CV LAB;  Service: Cardiovascular;  Laterality: N/A;      Allergies:   Allergies[1]  Social History:   Social History   Socioeconomic History   Marital status: Divorced    Spouse name: Not on file   Number of children: 1   Years of education: 12   Highest education level: Not on file  Occupational History   Occupation: Disability  Tobacco Use   Smoking status: Never   Smokeless tobacco: Never  Vaping Use   Vaping status: Never Used  Substance and Sexual Activity   Alcohol use: Yes    Comment: Last drink before 06/08/2022   Drug use: No   Sexual activity: Not Currently  Other Topics Concern   Not on file  Social History Narrative   Lives in [place] alone.  Disabled.  Previously worked as a geophysicist/field seismologist.   Fun: Rest, Read and watch movies.    Social Drivers of Health   Tobacco Use: Low Risk (01/15/2024)   Patient History    Smoking Tobacco Use: Never    Smokeless Tobacco Use: Never    Passive Exposure: Not on file  Financial Resource Strain: High Risk (08/27/2023)   Overall Financial Resource Strain (CARDIA)    Difficulty of Paying Living Expenses: Hard  Food Insecurity: No Food Insecurity (06/24/2023)   Epic    Worried About Programme Researcher, Broadcasting/film/video in the Last Year: Never true    Ran Out of Food in the Last Year: Never true  Transportation Needs: Unmet Transportation Needs (09/08/2023)   Epic    Lack of Transportation (Medical): Yes    Lack of Transportation (Non-Medical): Yes  Physical Activity: Not on file  Stress: Not on file  Social Connections: Patient Declined (06/24/2023)   Social Connection and Isolation Panel    Frequency of Communication with Friends and Family: Patient declined    Frequency of Social Gatherings with Friends and Family: Patient declined    Attends Religious Services: Patient declined    Active Member of Clubs or Organizations: Patient declined    Attends Banker  Meetings: Patient declined    Marital Status: Patient declined  Intimate Partner Violence: Not At Risk (06/24/2023)   Epic    Fear of Current or Ex-Partner: No    Emotionally Abused: No    Physically Abused: No    Sexually Abused: No  Depression (PHQ2-9): High Risk (07/26/2023)   Depression (PHQ2-9)    PHQ-2 Score: 13  Alcohol Screen: Not on file  Housing: Unknown (06/24/2023)   Epic    Unable to Pay for Housing in the Last Year: No    Number of Times Moved in the Last Year: 0    Homeless in the Last Year: Not on file  Utilities: Not At Risk (06/24/2023)   Epic    Threatened with loss of utilities: No  Health Literacy: Not on file    Family History:   Family History  Problem Relation Age of Onset   Breast cancer Mother    Pancreatic cancer Mother  Hypertension Father    Alzheimer's disease Father    Gout Father    Dementia Father    Hypertension Sister    Clotting disorder Sister    Obesity Brother    Bronchitis Brother    Cancer Maternal Uncle        unknown type; dx after 65; x2 maternal uncles   Breast cancer Paternal Aunt        dx after 34   Lung cancer Paternal Uncle    Heart disease Maternal Grandmother    Gout Paternal Grandfather    Colon polyps Neg Hx    Colon cancer Neg Hx    Esophageal cancer Neg Hx    Stomach cancer Neg Hx      ROS:  Please see the history of present illness.   All other ROS reviewed and negative.     Physical Exam/Data: Vitals:   01/15/24 1115 01/15/24 1125 01/15/24 1130 01/15/24 1145  BP: 101/85 98/79 (!) 85/73 (!) 119/102  Pulse: (!) 58 (!) 56 (!) 59 81  Resp: 17 11 18  (!) 27  Temp:      TempSrc:      SpO2: 100% 100% 100% 99%   No intake or output data in the 24 hours ending 01/15/24 1203    12/24/2023    2:06 PM 11/22/2023    8:08 AM 09/01/2023    2:00 PM  Last 3 Weights  Weight (lbs) 193 lb 9.6 oz 194 lb 14.4 oz 190 lb 12.8 oz  Weight (kg) 87.816 kg 88.406 kg 86.546 kg     There is no height or weight on file to  calculate BMI.   General: Well developed, in no acute distress.  Neck: Mildly elevated JVD.  Cardiac: Normal rate, regular rhythm.  Resp: Normal work of breathing.  Abd: Distended Ext: No edema.  Neuro: No gross focal deficits.  Psych: Normal affect.   EKG:  The EKG was personally reviewed and demonstrates:  MMVT    Relevant CV Studies: Echo 02/11/23:   1. Left ventricular ejection fraction, by estimation, is 25 to 30%. The  left ventricle has severely decreased function. The left ventricle  demonstrates regional wall motion abnormalities (see scoring  diagram/findings for description). The left  ventricular internal cavity size was moderately dilated. There is mild  concentric left ventricular hypertrophy. Left ventricular diastolic  parameters are consistent with Grade I diastolic dysfunction (impaired  relaxation).   2. Right ventricular systolic function is normal. The right ventricular  size is normal.   3. Left atrial size was mildly dilated.   4. Device lead noted.   5. The mitral valve is normal in structure. Mild to moderate mitral valve  regurgitation. No evidence of mitral stenosis.   6. The aortic valve is normal in structure. Aortic valve regurgitation is  trivial. No aortic stenosis is present.   7. The inferior vena cava is normal in size with greater than 50%  respiratory variability, suggesting right atrial pressure of 3 mmHg.   Laboratory Data: High Sensitivity Troponin:  No results for input(s): TROPONINIHS in the last 720 hours. No results for input(s): TRNPT in the last 720 hours.    Chemistry Recent Labs  Lab 01/12/24 1636 01/15/24 1138  NA 133* 138  K 4.0 4.0  CL 97 106  CO2 19*  --   GLUCOSE 100* 129*  BUN 39* 40*  CREATININE 2.12* 2.30*  CALCIUM  9.0  --     Recent Labs  Lab 01/12/24 1636  PROT 7.3  ALBUMIN 4.1  AST 66*  ALT 78*  ALKPHOS 80  BILITOT 0.6   Lipids No results for input(s): CHOL, TRIG, HDL, LABVLDL,  LDLCALC, CHOLHDL in the last 168 hours.  Hematology Recent Labs  Lab 01/12/24 1636 01/15/24 1115 01/15/24 1138  WBC 10.2 13.4*  --   RBC 4.23 4.28  --   HGB 13.6 13.8 14.6  HCT 41.6 42.5 43.0  MCV 98* 99.3  --   MCH 32.2 32.2  --   MCHC 32.7 32.5  --   RDW 11.8 12.4  --   PLT 189 203  --    Thyroid   Recent Labs  Lab 01/12/24 1636  TSH 0.689  FREET4 1.66    BNPNo results for input(s): BNP, PROBNP in the last 168 hours.  DDimer No results for input(s): DDIMER in the last 168 hours.  Radiology/Studies:  No results found.   Assessment and Plan: Mr. Ricardo is a 59 year old male with past medical history notable for chronic systolic heart failure secondary to nonischemic cardiomyopathy, ventricular tachycardia status post ICD, depression, hypertension, alcohol abuse who presented today with monomorphic ventricular tachycardia.  Patient describes decompensated heart failure symptoms preceding his episode of VT today.  NT proBNP is slightly elevated compared to prior.  Possibly ADHF precipitating his VT.  VT is not new for him, but his device had a monitor only zone at 170 bpm and no treatment until 200 bpm.  VT was recognized by device but no therapy provided.  Patient is being planned for upgrade to CRT-D with Dr. Inocencio on 01/21/2024.  #Monomorphic ventricular tachycardia # Acute on chronic systolic heart failure  Plan:  - Would give standard IV amiodarone  infusion for 24 hours.  Then if no recurrence of arrhythmia we will transition back to home oral regimen. - Start mexiletine 200 mg twice daily. - I have programmed his defibrillator with a treatment zone at 160 bpm with multiple ATP schemes prior to shocks. - IV Lasix  80 mg x 1, assess UOP and titrate as needed.  I do not feel he is significantly volume overloaded but mildly so. - Resume home GDMT regimen as BP allows.    Fonda Kitty, MD, [facility], [facility] Cardiac Electrophysiology     [1]  Allergies Allergen  Reactions   Entresto  [Sacubitril -Valsartan ] Other (See Comments)    History of angioedema   Lisinopril Shortness Of Breath    Makes lips swell   Other Hives and Swelling    Peanuts   Shellfish Allergy Hives and Swelling    Lip Swelling   Allopurinol  Nausea Only and Other (See Comments)    dizziness   Spironolactone  Other (See Comments)    Breast tenderness   "

## 2024-01-15 NOTE — Evaluation (Deleted)
 Physical Therapy Brief Evaluation and Discharge Note Patient Details Name: Stanley [name] MRN: [id number] DOB: [birth year] Today's Date: 01/15/2024   History of Present Illness  Pt is a 59 y/o male admitted 1/10 with chest pain.  Pt found to be in afib with RVR up in the 170's.   Medically converted to SR.  PMHx:  ARF, Alcohol abuse, CAD, chronic systolic CHF, Gouty arthritis, HTN, MI, sleep apnea  Clinical Impression  Pt is close to baseline functioning and should be safe at home alone. There are no further acute PT needs.  Will sign off at this time.        PT Assessment    Assistance Needed at Discharge       Equipment Recommendations None recommended by PT  Recommendations for Other Services       Precautions/Restrictions Precautions Precautions: None Recall of Precautions/Restrictions: Intact        Mobility  Bed Mobility          Transfers Overall transfer level: Independent                      Ambulation/Gait Ambulation/Gait assistance: Independent Gait Distance (Feet): 400 Feet Assistive device: None Gait Pattern/deviations: Step-through pattern   General Gait Details: WFL and age appropriate speeds.  Home Activity Instructions    Stairs            Modified Rankin (Stroke Patients Only)        Balance                   Dynamic Gait Index Level Surface: Normal Change in Gait Speed: Normal Gait with Horizontal Head Turns: Normal Gait with Vertical Head Turns: Normal Gait and Pivot Turn: Normal Step Over Obstacle: Normal Step Around Obstacles: Normal Steps: Mild Impairment Total Score: 23      Pertinent Vitals/Pain   Pain Assessment Pain Assessment: Faces Faces Pain Scale: No hurt Pain Intervention(s): Monitored during session     Home Living   Living Arrangements: Alone       Home Equipment: Rolling Walker (2 wheels)        Prior Function        UE/LE Assessment                Communication   Communication Communication: No apparent difficulties     Cognition         General Comments General comments (skin integrity, edema, etc.): HR maintained in the 80's during gait.  SpO2 94% with good PLETH    Exercises     Assessment/Plan    PT Problem List         PT Visit Diagnosis      No Skilled PT Patient is independent with all acitivity/mobility   Co-evaluation                AMPAC 6 Clicks Help needed turning from your back to your side while in a flat bed without using bedrails?: None Help needed moving from lying on your back to sitting on the side of a flat bed without using bedrails?: None Help needed moving to and from a bed to a chair (including a wheelchair)?: None Help needed standing up from a chair using your arms (e.g., wheelchair or bedside chair)?: None Help needed to walk in hospital room?: None Help needed climbing 3-5 steps with a railing? : None 6 Click Score: 24      End of Session   Activity  Tolerance: Patient tolerated treatment well;No increased pain Patient left: in bed;with call bell/phone within reach;with family/visitor present Nurse Communication: Mobility status       Time: 1655-1710 PT Time Calculation (min) (ACUTE ONLY): 15 min  Charges:   PT Evaluation $PT Eval Moderate Complexity: 1 Mod      01/15/2024  [place] HERO., PT Acute Rehabilitation Services [phone]  (office)  Vinie GAILS Tiffanyann Deroo  01/15/2024, 7:11 PM

## 2024-01-15 NOTE — ED Provider Notes (Signed)
 " [place] EMERGENCY DEPARTMENT AT [facility] Provider Note   CSN: [id number] Arrival date & time: 01/15/24  1050     Patient presents with: Chest Pain   Jarek Longton is a 59 y.o. male.    Chest Pain Patient presents anterior chest pain and pressure.  Began at around 7 this morning.  Has history of AICD and ventricular tachycardia.  Due to get some leads replaced on Friday.  Has had some chest pain over the last few days.  Comes and goes.  He claims compliance with his medications.    Past Medical History:  Diagnosis Date   Acute renal failure (ARF)    Acute respiratory failure with hypoxia (HCC) 12/29/2018   Alcohol abuse    Anxiety    CAD (coronary artery disease)    a. dx unclear, reported PCI in 2011 but cath 2014 normal coronaries and cardiac CT 07/2016 normal coronaries, no mention of stents.   Chronic chest pain    Chronic systolic CHF (congestive heart failure) (HCC)    Depression    Dyspnea    07/05/2019: per patient some times with exertion, has to catch breath   Family history of breast cancer 05/30/2021   Family history of pancreatic cancer 05/30/2021   Financial difficulties    Gout    Gouty arthritis    Headache    History of noncompliance with medical treatment    financial challenges   Hypertension    ICD (implantable cardioverter-defibrillator) in place    [employer] ICD implant done in [place] 2015   Insomnia    Lobar pneumonia 12/28/2018   Myocardial infarction (HCC) 2005   Nonischemic cardiomyopathy (HCC)    Sleep apnea    per patient, has CPAP does not wear it every night   Ventricular tachycardia (HCC) 01/2015   2017 - ATP delivered for sinus tach, degenerated into VT for which he received shock. Recurrent VT 03/2018.    Prior to Admission medications  Medication Sig Start Date End Date Taking? Authorizing Provider  allopurinol  (ZYLOPRIM ) 300 MG tablet Take 1 tablet (300 mg total) by mouth daily. 07/26/23   Fleming, Zelda W, NP   amiodarone  (PACERONE ) 200 MG tablet Take 1 tablet (200 mg total) by mouth daily. 09/28/23   Bensimhon, Toribio SAUNDERS, MD  aspirin  EC 81 MG tablet Take 81 mg by mouth daily. Swallow whole.    [provider]  atorvastatin  (LIPITOR) 20 MG tablet Take 1 tablet (20 mg total) by mouth daily. 07/26/23   Fleming, Zelda W, NP  empagliflozin  (JARDIANCE ) 10 MG TABS tablet Take 1 tablet (10 mg total) by mouth daily. 03/05/23   Glena Harlene HERO, FNP  eplerenone  (INSPRA ) 25 MG tablet Take 1 tablet (25 mg total) by mouth daily. 03/31/23   Bensimhon, Toribio SAUNDERS, MD  furosemide  (LASIX ) 40 MG tablet Take 2 tablets (80 mg total) by mouth in the morning AND 1 tablet (40 mg total) every evening. 09/01/23   Milford, Harlene HERO, FNP  losartan  (COZAAR ) 50 MG tablet Take 1 tablet (50 mg total) by mouth daily. 12/24/23   Bensimhon, Toribio SAUNDERS, MD  potassium chloride  SA (KLOR-CON  M) 20 MEQ tablet Take 2 tablets (40 mEq total) by mouth daily. 09/01/23   Milford, Harlene HERO, FNP  traZODone  (DESYREL ) 150 MG tablet Take 1 tablet (150 mg total) by mouth at bedtime. 01/05/24   Fleming, Zelda W, NP    Allergies: Entresto  [sacubitril -valsartan ], Lisinopril, Other, Shellfish allergy, Allopurinol , and Spironolactone   Review of Systems  Cardiovascular:  Positive for chest pain.    Updated Vital Signs BP (!) 119/102   Pulse 81   Temp 98.5 F (36.9 C) (Oral)   Resp (!) 27   SpO2 99%   Physical Exam Vitals reviewed.  Cardiovascular:     Rate and Rhythm: Normal rate and regular rhythm.  Chest:     Chest wall: No tenderness.  Musculoskeletal:     Right lower leg: No edema.     Left lower leg: No edema.  Neurological:     Mental Status: He is alert.     (all labs ordered are listed, but only abnormal results are displayed) Labs Reviewed  CBC WITH DIFFERENTIAL/PLATELET - Abnormal; Notable for the following components:      Result Value   WBC 13.4 (*)    Neutro Abs 10.2 (*)    Abs Immature Granulocytes 0.10 (*)     All other components within normal limits  I-STAT CHEM 8, ED - Abnormal; Notable for the following components:   BUN 40 (*)    Creatinine, Ser 2.30 (*)    Glucose, Bld 129 (*)    Calcium , Ion 1.14 (*)    All other components within normal limits  PRO BRAIN NATRIURETIC PEPTIDE  MAGNESIUM   TROPONIN T, HIGH SENSITIVITY    EKG: EKG Interpretation Date/Time:  Saturday January 15 2024 11:09:00 EST Ventricular Rate:  169 PR Interval:  39 QRS Duration:  196 QT Interval:  316 QTC Calculation: 530 R Axis:   262  Text Interpretation: Extreme tachycardia with wide complex, no further rhythm analysis attempted Artifact in lead(s) I II III aVR aVL aVF V1 V2 V3 Confirmed by Patsey Lot [phone]) on 01/15/2024 11:44:36 AM  Radiology: No results found.   Procedures   Medications Ordered in the ED  amiodarone  (NEXTERONE ) 1.5 mg/mL IV bolus only 150 mg (0 mg Intravenous Stopped 01/15/24 1142)    Clinical Course as of 01/15/24 1202  Sat Jan 15, 2024  1148 Patient now appears to have converted out of his ventricular tachycardia after the amiodarone  bolus [NP]    Clinical Course User Index [NP] Patsey Lot, MD                                 Medical Decision Making Amount and/or Complexity of Data Reviewed Labs: ordered. Radiology: ordered.  Risk Prescription drug management. Decision regarding hospitalization.   Patient presented with shortness of breath and chest pressure.  Initial EKG appears to be a wide-complex tachycardia likely ventricular tachycardia.  However patient is awake and talking although mildly hypotensive.  Does have an underlying pulse at around 60 however.  This will actually somewhat wax and wane in its intensity.  Discussed with cardiology.  Recommended IV amiodarone  bolus.  Bolus given and eventually did have converted back to sinus rhythm.  Also discussed with Dr. Kennyth from electrophysiology.  Recommends admission to hospital.  IV amiodarone .  Will  start mexiletine.  Recommends hospitalist admission.  CRITICAL CARE Performed by: Lot Patsey Total critical care time: 30 minutes Critical care time was exclusive of separately billable procedures and treating other patients. Critical care was necessary to treat or prevent imminent or life-threatening deterioration. Critical care was time spent personally by me on the following activities: development of treatment plan with patient and/or surrogate as well as nursing, discussions with consultants, evaluation of patient's response to treatment, examination of patient, obtaining history  from patient or surrogate, ordering and performing treatments and interventions, ordering and review of laboratory studies, ordering and review of radiographic studies, pulse oximetry and re-evaluation of patient's condition.      Final diagnoses:  Ventricular tachycardia [facility])    ED Discharge Orders     None          Patsey Lot, MD 01/15/24 1202  "

## 2024-01-15 NOTE — ED Notes (Signed)
 ED Provider at bedside.

## 2024-01-16 ENCOUNTER — Other Ambulatory Visit (HOSPITAL_COMMUNITY): Payer: Self-pay

## 2024-01-16 DIAGNOSIS — Z95810 Presence of automatic (implantable) cardiac defibrillator: Secondary | ICD-10-CM

## 2024-01-16 MED ORDER — FUROSEMIDE 10 MG/ML IJ SOLN
80.0000 mg | Freq: Once | INTRAMUSCULAR | Status: AC
  Administered 2024-01-16: 80 mg via INTRAVENOUS
  Filled 2024-01-16: qty 8

## 2024-01-16 MED ORDER — AMIODARONE HCL 200 MG PO TABS
200.0000 mg | ORAL_TABLET | Freq: Two times a day (BID) | ORAL | 0 refills | Status: AC
  Filled 2024-01-16: qty 28, 14d supply, fill #0

## 2024-01-16 MED ORDER — EMPAGLIFLOZIN 10 MG PO TABS
10.0000 mg | ORAL_TABLET | Freq: Every day | ORAL | Status: DC
  Administered 2024-01-16: 10 mg via ORAL
  Filled 2024-01-16: qty 1

## 2024-01-16 MED ORDER — LOSARTAN POTASSIUM 50 MG PO TABS
50.0000 mg | ORAL_TABLET | Freq: Every day | ORAL | Status: DC
  Administered 2024-01-16: 50 mg via ORAL
  Filled 2024-01-16: qty 1

## 2024-01-16 MED ORDER — MEXILETINE HCL 200 MG PO CAPS
200.0000 mg | ORAL_CAPSULE | Freq: Two times a day (BID) | ORAL | 0 refills | Status: AC
  Filled 2024-01-16: qty 60, 30d supply, fill #0

## 2024-01-16 MED ORDER — EPLERENONE 25 MG PO TABS
25.0000 mg | ORAL_TABLET | Freq: Every day | ORAL | Status: DC
  Administered 2024-01-16: 25 mg via ORAL
  Filled 2024-01-16: qty 1

## 2024-01-16 MED ORDER — AMIODARONE HCL 200 MG PO TABS
200.0000 mg | ORAL_TABLET | Freq: Two times a day (BID) | ORAL | Status: DC
  Administered 2024-01-16: 200 mg via ORAL
  Filled 2024-01-16: qty 1

## 2024-01-16 NOTE — Progress Notes (Addendum)
"  °  Progress Note  Patient Name: Stanley [name] Date of Encounter: 01/16/2024  [facility]Care Cardiologist: Toribio Fuel, MD   Interval Summary   No acute overnight events. Patient reports feeling relatively well. No new or acute complaints.   Vital Signs Vitals:   01/15/24 1933 01/16/24 0022 01/16/24 0346 01/16/24 0713  BP: 111/74 100/77 102/63 99/71  Pulse: 70 66 64 65  Resp: 18 18 18    Temp: 97.8 F (36.6 C) 98.2 F (36.8 C) 97.9 F (36.6 C)   TempSrc: Oral Oral Oral   SpO2: 99% 97% 97% 98%  Weight:      Height:        Intake/Output Summary (Last 24 hours) at 01/16/2024 0938 Last data filed at 01/16/2024 0849 Gross per 24 hour  Intake 722.2 ml  Output 740 ml  Net -17.8 ml      01/15/2024    2:15 PM 12/24/2023    2:06 PM 11/22/2023    8:08 AM  Last 3 Weights  Weight (lbs) 191 lb 12.8 oz 193 lb 9.6 oz 194 lb 14.4 oz  Weight (kg) 87 kg 87.816 kg 88.406 kg      Telemetry/ECG  No ventricular arrhythmias - Personally Reviewed  Physical Exam  General: Well developed, in no acute distress.  Neck: Mildly elevated JVD.  Cardiac: Normal rate, regular rhythm.  Resp: Normal work of breathing.  Abd: Distended Ext: No edema.  Neuro: No gross focal deficits.  Psych: Normal affect.   Assessment & Plan  [name] is a 59 year old male with past medical history notable for chronic systolic heart failure secondary to nonischemic cardiomyopathy, ventricular tachycardia status post ICD, depression, hypertension, alcohol abuse who presented today with monomorphic ventricular tachycardia.  Patient describes decompensated heart failure symptoms preceding his episode of VT today.  NT proBNP is slightly elevated compared to prior.  Possibly ADHF precipitating his VT.  VT is not new for him, but his device had a monitor only zone at 170 bpm and no treatment until 200 bpm.  VT was recognized by device but no therapy provided.   Patient is being planned for upgrade to CRT-D  with Dr. Inocencio on 01/21/2024.  No further episodes overnight.    #Monomorphic ventricular tachycardia # Acute on chronic systolic heart failure   Plan:  - Stop IV amiodarone  and restart amiodarone  at 200mg  BID x 2 weeks then back to 200mg  once daily. - Continue mexiletine 200 mg twice daily. - I have programmed his defibrillator with a treatment zone at 160 bpm with multiple ATP schemes prior to shocks. - Will give IV Lasix  80 mg again this morning, resume home regimen on discharge. - Resume home GDMT regimen this morning.  If no further episodes of ventricular arrhythmias after stopping IV amiodarone  and he is hemodynamically stable on home regimen, then okay for discharge later this afternoon from an EP perspective.     Signed, Fonda Kitty, MD   "

## 2024-01-16 NOTE — Care Management Obs Status (Signed)
 MEDICARE OBSERVATION STATUS NOTIFICATION   Patient Details  Name: Stanley [name] MRN: [id number] Date of Birth: 17-Nov-1965   Medicare Observation Status Notification Given:  Yes    Marval Gell, RN 01/16/2024, 2:01 PM

## 2024-01-16 NOTE — Care Management (Signed)
 Patient noted to have Medicare A only. Spoke to him about coverage. He states that he has family Medicaid and a [employer] plan for drugs. He states that he has been trying to get full Medicaid coverage that he has two years ago. Encouraged him to go DHHS/ Health Department to follow up getting his Medicaid switched to full coverage.  Also emailed financial counselors follow up with him.

## 2024-01-16 NOTE — Hospital Course (Signed)
"   59 y.o. male with past medical history  of  systolic HF due to NICM d/x'd in 2006 (felt 2/2 HTN/ETOH), ? CAD s/p PCI to unknown vessel 2011 at outside location (cath 2014 without CAD and normal coronaries by CT 07/2016), VT s/p [facility] ICD (2015), noncompliance, chronic noncardiac chest pain, depression, HTN and ETOH abuse, coming in today for chest pain has been going off and on for the past week progressively getting worse today morning started at around 7 and found to be in V. tach.  Per ED provider AICD is not functioning and EP cardiology was consulted in the emergency room and patient was seen and started on amiodarone  infusion.  "

## 2024-01-16 NOTE — Plan of Care (Signed)
   Problem: Education: Goal: Knowledge of General Education information will improve Description: Including pain rating scale, medication(s)/side effects and non-pharmacologic comfort measures Outcome: Progressing   Problem: Clinical Measurements: Goal: Cardiovascular complication will be avoided Outcome: Progressing

## 2024-01-16 NOTE — Progress Notes (Signed)
 OT Cancellation Note  Patient Details Name: Stanley [name] MRN: [id number] DOB: 01-06-1966   Cancelled Treatment:    Reason Eval/Treat Not Completed: OT screened, no needs identified, will sign off. Per PT. Pt independent and at baseline. Thank you for this order.   [name] [name], OTR/L [facility] Office: [phone]   [name] 01/16/2024, 6:31 AM

## 2024-01-16 NOTE — Discharge Summary (Signed)
 " Physician Discharge Summary   Patient: Stanley [name] MRN: [id number] DOB: 18-Oct-1965  Admit date:     01/15/2024  Discharge date: 01/16/2024  Discharge Physician: Garnette Pelt   PCP: Theotis Haze ORN, NP   Recommendations at discharge:    Follow up with PCP in 1-2 weeks Follow up with EP as scheduled  Discharge Diagnoses: Principal Problem:   Chest pain  Resolved Problems:   * No resolved hospital problems. *  Hospital Course:  59 y.o. male with past medical history  of  systolic HF due to NICM d/x'd in 2006 (felt 2/2 HTN/ETOH), ? CAD s/p PCI to unknown vessel 2011 at outside location (cath 2014 without CAD and normal coronaries by CT 07/2016), VT s/p [facility] ICD (2015), noncompliance, chronic noncardiac chest pain, depression, HTN and ETOH abuse, coming in today for chest pain has been going off and on for the past week progressively getting worse today morning started at around 7 and found to be in V. tach.  Per ED provider AICD is not functioning and EP cardiology was consulted in the emergency room and patient was seen and started on amiodarone  infusion.   Assessment and Plan: >> Chest pain: -Resolved -Continued aspirin  and atorvastatin .   >> Monomorphic V. Tach + AICD : Sustained V. tach on presentation with a heart rate of 170 reportedly ICD malfunction -EP/Cardiology consulted. ICD reprogrammed patient  -Pt was initially continued on amiodarone  gtt with mexiletine -Pt was transitioned to PO amio, recs to continue amiodarone  200mg  bid x 2 weeks, followed by 200mg  daily with mexiletine 200mg  bid   >> Reduced ejection fraction congestive heart failure, nonischemic cardiomyopathy: Based on most recent echo done on February 2025 ejection fraction has 25 to 30%.   >> Alcohol abuse: -cessation was done face to face   >> AKI on CKD: Current creatinine of 2.30 baseline about 2.07. -cont home meds on d/c     >>Chronic systolic congestive heart failure: -cont home meds  on d/c   >>Mild transaminitis: Pt had mild elevation of lft on December 2025 cmp 2/2 to alcohol.  Last A1c of 4.4 in 2024 noted       Consultants: Cardiology/EP Procedures performed: AICD reprogramming  Disposition: Home Diet recommendation:  Cardiac diet DISCHARGE MEDICATION: Allergies as of 01/16/2024       Reactions   Entresto  [sacubitril -valsartan ] Other (See Comments)   History of angioedema   Lisinopril Shortness Of Breath   Makes lips swell   Other Hives, Swelling   Peanuts   Shellfish Allergy Hives, Swelling   Lip Swelling   Allopurinol  Nausea Only, Other (See Comments)   dizziness   Spironolactone  Other (See Comments)   Breast tenderness        Medication List     PAUSE taking these medications    amiodarone  200 MG tablet Wait to take this until: January 31, 2024 Commonly known as: Pacerone  Take 1 tablet (200 mg total) by mouth daily. You also have another medication with the same name that you may need to continue taking.       TAKE these medications    allopurinol  300 MG tablet Commonly known as: ZYLOPRIM  Take 1 tablet (300 mg total) by mouth daily.   amiodarone  200 MG tablet Commonly known as: PACERONE  Take 1 tablet (200 mg total) by mouth 2 (two) times daily. What changed: Another medication with the same name was paused. Ask your nurse or doctor if you should take this medication.   aspirin  EC 81  MG tablet Take 81 mg by mouth daily. Swallow whole.   atorvastatin  20 MG tablet Commonly known as: LIPITOR Take 1 tablet (20 mg total) by mouth daily.   eplerenone  25 MG tablet Commonly known as: INSPRA  Take 1 tablet (25 mg total) by mouth daily.   furosemide  40 MG tablet Commonly known as: LASIX  Take 2 tablets (80 mg total) by mouth in the morning AND 1 tablet (40 mg total) every evening. What changed: See the new instructions.   Jardiance  10 MG Tabs tablet Generic drug: empagliflozin  Take 1 tablet (10 mg total) by mouth daily.    losartan  50 MG tablet Commonly known as: COZAAR  Take 1 tablet (50 mg total) by mouth daily.   mexiletine 200 MG capsule Commonly known as: MEXITIL  Take 1 capsule (200 mg total) by mouth every 12 (twelve) hours.   traZODone  150 MG tablet Commonly known as: DESYREL  Take 1 tablet (150 mg total) by mouth at bedtime.        Follow-up Information     Theotis Haze ORN, NP Follow up in 2 week(s).   Specialty: Nurse Practitioner Why: Hospital follow up Contact information: [address] [place] [address] [phone]         Follow up with Cardiology as scheduled Follow up.   Why: Hospital follow up               Discharge Exam: Filed Weights   01/15/24 1415  Weight: 87 kg   General exam: Awake, laying in bed, in nad Respiratory system: Normal respiratory effort, no wheezing Cardiovascular system: regular rate, s1, s2 Gastrointestinal system: Soft, nondistended, positive BS Central nervous system: CN2-12 grossly intact, strength intact Extremities: Perfused, no clubbing Skin: Normal skin turgor, no notable skin lesions seen Psychiatry: Mood normal // no visual hallucinations   Condition at discharge: fair  The results of significant diagnostics from this hospitalization (including imaging, microbiology, ancillary and laboratory) are listed below for reference.   Imaging Studies: DG Chest Portable 1 View Result Date: 01/15/2024 CLINICAL DATA:  Worsening chest pain and shortness of breath. EXAM: PORTABLE CHEST 1 VIEW COMPARISON:  06/24/2023 and CT chest 12/28/2018. FINDINGS: Inspire device along the right chest and neck. ICD lead tip is in the right ventricle. Heart is enlarged, stable. Lungs are clear. No pleural fluid. IMPRESSION: No acute findings. Electronically Signed   By: Newell Eke M.D.   On: 01/15/2024 12:07   CUP PACEART REMOTE DEVICE CHECK Result Date: 12/23/2023 ICD Scheduled remote reviewed. Normal device function.  Presenting rhythm: VS  Next remote transmission per protocol. [place], CVRS   Microbiology: Results for orders placed or performed during the hospital encounter of 02/09/23  Resp panel by RT-PCR (RSV, Flu A&B, Covid) Anterior Nasal Swab     Status: None   Collection Time: 02/09/23  1:12 PM   Specimen: Anterior Nasal Swab  Result Value Ref Range Status   SARS Coronavirus 2 by RT PCR NEGATIVE NEGATIVE Final   Influenza A by PCR NEGATIVE NEGATIVE Final   Influenza B by PCR NEGATIVE NEGATIVE Final    Comment: (NOTE) The Xpert Xpress SARS-CoV-2/FLU/RSV plus assay is intended as an aid in the diagnosis of influenza from Nasopharyngeal swab specimens and should not be used as a sole basis for treatment. Nasal washings and aspirates are unacceptable for Xpert Xpress SARS-CoV-2/FLU/RSV testing.  Fact Sheet for Patients: bloggercourse.com  Fact Sheet for Healthcare Providers: seriousbroker.it  This test is not yet approved or cleared by the [place]  FDA  and has been authorized for detection and/or diagnosis of SARS-CoV-2 by FDA under an Emergency Use Authorization (EUA). This EUA will remain in effect (meaning this test can be used) for the duration of the COVID-19 declaration under Section 564(b)(1) of the Act, 21 U.S.C. section 360bbb-3(b)(1), unless the authorization is terminated or revoked.     Resp Syncytial Virus by PCR NEGATIVE NEGATIVE Final    Comment: (NOTE) Fact Sheet for Patients: bloggercourse.com  Fact Sheet for Healthcare Providers: seriousbroker.it  This test is not yet approved or cleared by the [place]  FDA and has been authorized for detection and/or diagnosis of SARS-CoV-2 by FDA under an Emergency Use Authorization (EUA). This EUA will remain in effect (meaning this test can be used) for the duration of the COVID-19 declaration under Section 564(b)(1) of the Act, 21  U.S.C. section 360bbb-3(b)(1), unless the authorization is terminated or revoked.  Performed at [facility], [address], [place], [place] [address]   Respiratory (~20 pathogens) panel by PCR     Status: None   Collection Time: 02/09/23  1:12 PM   Specimen: Nasopharyngeal Swab; Respiratory  Result Value Ref Range Status   Adenovirus NOT DETECTED NOT DETECTED Final   Coronavirus 229E NOT DETECTED NOT DETECTED Final    Comment: (NOTE) The Coronavirus on the Respiratory Panel, DOES NOT test for the novel  Coronavirus (2019 nCoV)    Coronavirus HKU1 NOT DETECTED NOT DETECTED Final   Coronavirus NL63 NOT DETECTED NOT DETECTED Final   Coronavirus OC43 NOT DETECTED NOT DETECTED Final   Metapneumovirus NOT DETECTED NOT DETECTED Final   Rhinovirus / Enterovirus NOT DETECTED NOT DETECTED Final   Influenza A NOT DETECTED NOT DETECTED Final   Influenza B NOT DETECTED NOT DETECTED Final   Parainfluenza Virus 1 NOT DETECTED NOT DETECTED Final   Parainfluenza Virus 2 NOT DETECTED NOT DETECTED Final   Parainfluenza Virus 3 NOT DETECTED NOT DETECTED Final   Parainfluenza Virus 4 NOT DETECTED NOT DETECTED Final   Respiratory Syncytial Virus NOT DETECTED NOT DETECTED Final   Bordetella pertussis NOT DETECTED NOT DETECTED Final   Bordetella Parapertussis NOT DETECTED NOT DETECTED Final   Chlamydophila pneumoniae NOT DETECTED NOT DETECTED Final   Mycoplasma pneumoniae NOT DETECTED NOT DETECTED Final    Comment: Performed at [facility], [address] [place], [place] [address]    Labs: CBC: Recent Labs  Lab 01/12/24 1636 01/15/24 1115 01/15/24 1138  WBC 10.2 13.4*  --   NEUTROABS  --  10.2*  --   HGB 13.6 13.8 14.6  HCT 41.6 42.5 43.0  MCV 98* 99.3  --   PLT 189 203  --    Basic Metabolic Panel: Recent Labs  Lab 01/12/24 1636 01/15/24 1115 01/15/24 1138  NA 133*  --  138  K 4.0  --  4.0  CL 97  --  106  CO2 19*  --   --   GLUCOSE 100*  --  129*  BUN 39*  --   40*  CREATININE 2.12*  --  2.30*  CALCIUM  9.0  --   --   MG  --  2.1  --    Liver Function Tests: Recent Labs  Lab 01/12/24 1636 01/15/24 1115  AST 66* 76*  ALT 78* 101*  ALKPHOS 80 77  BILITOT 0.6 0.6  PROT 7.3 7.7  ALBUMIN 4.1 4.0   CBG: No results for input(s): GLUCAP in the last 168 hours.  Discharge time spent: less  than 30 minutes.  Signed: Garnette Pelt, MD [facility] 01/16/2024 "

## 2024-01-16 NOTE — Care Management CC44 (Signed)
"         Condition Code 44 Documentation Completed  Patient Details  Name: Delphin Funes MRN: [id number] Date of Birth: 1965-07-13   Condition Code 44 given:  Yes Patient signature on Condition Code 44 notice:  Yes Documentation of 2 MD's agreement:  Yes Code 44 added to claim:  Yes    Marval Gell, RN 01/16/2024, 2:01 PM  "

## 2024-01-17 ENCOUNTER — Other Ambulatory Visit (HOSPITAL_COMMUNITY): Payer: Self-pay

## 2024-01-17 ENCOUNTER — Telehealth: Payer: Self-pay

## 2024-01-17 NOTE — Transitions of Care (Post Inpatient/ED Visit) (Signed)
" ° °  01/17/2024  Name: Roarke Marciano MRN: [id number] DOB: 01-Dec-1965  Today's TOC FU Call Status: Today's TOC FU Call Status:: Unsuccessful Call (1st Attempt) Unsuccessful Call (1st Attempt) Date: 01/17/24  Attempted to reach the patient regarding the most recent Inpatient/ED visit.  Follow Up Plan: Additional outreach attempts will be made to reach the patient to complete the Transitions of Care (Post Inpatient/ED visit) call.   Signature  Slater Diesel, RN   "

## 2024-01-18 ENCOUNTER — Telehealth: Payer: Self-pay

## 2024-01-18 NOTE — Transitions of Care (Post Inpatient/ED Visit) (Signed)
" ° °  01/18/2024  Name: Stanley [name] MRN: [id number] DOB: 06/09/1965  Today's TOC FU Call Status: Today's TOC FU Call Status:: Unsuccessful Call (2nd Attempt) Unsuccessful Call (1st Attempt) Date: 01/17/24 Unsuccessful Call (2nd Attempt) Date: 01/18/24  Attempted to reach the patient regarding the most recent Inpatient/ED visit.  Follow Up Plan: Additional outreach attempts will be made to reach the patient to complete the Transitions of Care (Post Inpatient/ED visit) call.   Signature  Slater Diesel, RN   "

## 2024-01-19 ENCOUNTER — Telehealth: Payer: Self-pay

## 2024-01-19 NOTE — Telephone Encounter (Signed)
 Copied from CRM [id number]. Topic: General - Other >> Jan 19, 2024  1:41 PM   Donna BRAVO wrote:  Reason for CRM: Patient returning missed call from [place]. Patient asking to speak with Slater  Call dropped while speaking with CAL  Called patient back and informed patient Slater will call him back

## 2024-01-19 NOTE — Transitions of Care (Post Inpatient/ED Visit) (Signed)
" ° °  01/19/2024  Name: Stanley [name] MRN: [id number] DOB: 08/16/1965  Today's TOC FU Call Status: Today's TOC FU Call Status:: Unsuccessful Call (3rd Attempt) Unsuccessful Call (1st Attempt) Date: 01/17/24 Unsuccessful Call (2nd Attempt) Date: 01/18/24 Unsuccessful Call (3rd Attempt) Date: 01/19/24  Attempted to reach the patient regarding the most recent Inpatient/ED visit.  Follow Up Plan: No further outreach attempts will be made at this time. We have been unable to contact the patient.  I also called [phone] and it just rings fast busy.   The patient is scheduled for a BIV ICD insertion 01/21/2024.   Signature Slater Diesel, RN   "

## 2024-01-20 ENCOUNTER — Telehealth: Payer: Self-pay | Admitting: Cardiology

## 2024-01-20 ENCOUNTER — Other Ambulatory Visit: Payer: Self-pay | Admitting: Nurse Practitioner

## 2024-01-20 ENCOUNTER — Telehealth: Payer: Self-pay

## 2024-01-20 DIAGNOSIS — M1A00X0 Idiopathic chronic gout, unspecified site, without tophus (tophi): Secondary | ICD-10-CM

## 2024-01-20 NOTE — Telephone Encounter (Addendum)
 Call returned to patient and message left with call back requested.  I was following up from his hospitalization and I understand he has a procedure scheduled for tomorrow, 01/21/2024.

## 2024-01-20 NOTE — Telephone Encounter (Signed)
 Pt would like a c/b as to whether he is able to have his procedure tomorrow due to insurance issue. Please advise

## 2024-01-20 NOTE — Telephone Encounter (Signed)
 Pt aware procedure will have to be cancelled for now. Aware RN from hospital will call him tomorrow to inform him of what he will need to do.  Aware I will follow up later to see about rescheduling. Pt agreeable to plan.

## 2024-01-20 NOTE — Telephone Encounter (Signed)
 Opened in error

## 2024-01-21 ENCOUNTER — Ambulatory Visit (HOSPITAL_COMMUNITY): Admission: RE | Admit: 2024-01-21 | Payer: Self-pay | Admitting: Cardiology

## 2024-01-21 ENCOUNTER — Encounter: Payer: Self-pay | Admitting: Internal Medicine

## 2024-01-21 ENCOUNTER — Encounter (HOSPITAL_COMMUNITY): Admission: RE | Payer: Self-pay

## 2024-01-21 SURGERY — BIV ICD INSERTION CRT-D

## 2024-01-26 ENCOUNTER — Telehealth: Payer: Self-pay | Admitting: Cardiology

## 2024-01-26 NOTE — Telephone Encounter (Signed)
 Pt returning call about procedure on 16th, please advise.

## 2024-01-27 ENCOUNTER — Other Ambulatory Visit (HOSPITAL_COMMUNITY): Payer: Self-pay

## 2024-01-28 NOTE — Telephone Encounter (Signed)
 Followed up with pt.  Aware he will need to obtain Medicare A&B or an advantage policy to be covered for this procedure.   (Believe we were told last week that pt makes too much to qualify for regular Medicaid and he understands family medicaid will not cover this procedure) Pt  reports that he is looking into an advantage policy with Medicare.  We agreed to check in weekly, until a decision can be made. Pt understands OOP will be very expensive and understands if we are to re-address self pay that he will need to arrange with hospital billing before procedure can be scheduled. Pt agreeable to plan, we keep weekly follow up until decision is made. Pt appreciates my call, taking time to speak with him about this.

## 2024-01-31 ENCOUNTER — Other Ambulatory Visit (HOSPITAL_COMMUNITY): Payer: Self-pay

## 2024-01-31 ENCOUNTER — Telehealth (HOSPITAL_COMMUNITY): Payer: Self-pay

## 2024-01-31 NOTE — Telephone Encounter (Signed)
 Advanced Heart Failure Patient Advocate Encounter  The patient was approved for a [employer] grant that will help cover the cost of Amiodarone , Eplerenone , Jardiance , Losartan .  Total amount awarded, $7,500.  Effective: 01/13/2024 - 01/11/2025.  BIN [id number] PCN [id number] Group [id number] ID [id number]  Approval and processing information added to GAILA Rachel DEL, CPhT Rx Patient Advocate Phone: [phone]

## 2024-02-11 NOTE — Telephone Encounter (Signed)
 Left detailed message informing pt I would send him a mychart message and he can respond to that, to update us , without having to call back.

## 2024-02-24 ENCOUNTER — Ambulatory Visit (HOSPITAL_COMMUNITY): Payer: Self-pay | Admitting: Internal Medicine
